# Patient Record
Sex: Female | Born: 1935 | Race: White | Hispanic: No | Smoking: Former smoker
Health system: Southern US, Community
[De-identification: ages and names within clinical notes are randomized; demographics above are authoritative.]

## PROBLEM LIST (undated history)

## (undated) DIAGNOSIS — Z8719 Personal history of other diseases of the digestive system: Secondary | ICD-10-CM

## (undated) DIAGNOSIS — Z9289 Personal history of other medical treatment: Secondary | ICD-10-CM

## (undated) DIAGNOSIS — I480 Paroxysmal atrial fibrillation: Secondary | ICD-10-CM

## (undated) DIAGNOSIS — R7309 Other abnormal glucose: Secondary | ICD-10-CM

## (undated) DIAGNOSIS — E7800 Pure hypercholesterolemia, unspecified: Secondary | ICD-10-CM

## (undated) DIAGNOSIS — N183 Chronic kidney disease, stage 3 unspecified: Secondary | ICD-10-CM

## (undated) DIAGNOSIS — C4490 Unspecified malignant neoplasm of skin, unspecified: Secondary | ICD-10-CM

## (undated) DIAGNOSIS — H269 Unspecified cataract: Secondary | ICD-10-CM

## (undated) DIAGNOSIS — I5032 Chronic diastolic (congestive) heart failure: Secondary | ICD-10-CM

## (undated) DIAGNOSIS — J189 Pneumonia, unspecified organism: Secondary | ICD-10-CM

## (undated) DIAGNOSIS — D649 Anemia, unspecified: Secondary | ICD-10-CM

## (undated) DIAGNOSIS — T8859XA Other complications of anesthesia, initial encounter: Secondary | ICD-10-CM

## (undated) DIAGNOSIS — R001 Bradycardia, unspecified: Secondary | ICD-10-CM

## (undated) DIAGNOSIS — F329 Major depressive disorder, single episode, unspecified: Secondary | ICD-10-CM

## (undated) DIAGNOSIS — J45909 Unspecified asthma, uncomplicated: Secondary | ICD-10-CM

## (undated) DIAGNOSIS — R1310 Dysphagia, unspecified: Secondary | ICD-10-CM

## (undated) DIAGNOSIS — I82409 Acute embolism and thrombosis of unspecified deep veins of unspecified lower extremity: Secondary | ICD-10-CM

## (undated) DIAGNOSIS — M109 Gout, unspecified: Secondary | ICD-10-CM

## (undated) DIAGNOSIS — K219 Gastro-esophageal reflux disease without esophagitis: Secondary | ICD-10-CM

## (undated) DIAGNOSIS — I10 Essential (primary) hypertension: Secondary | ICD-10-CM

## (undated) DIAGNOSIS — K5792 Diverticulitis of intestine, part unspecified, without perforation or abscess without bleeding: Secondary | ICD-10-CM

## (undated) DIAGNOSIS — Z950 Presence of cardiac pacemaker: Secondary | ICD-10-CM

## (undated) DIAGNOSIS — T4145XA Adverse effect of unspecified anesthetic, initial encounter: Secondary | ICD-10-CM

## (undated) DIAGNOSIS — M797 Fibromyalgia: Secondary | ICD-10-CM

## (undated) DIAGNOSIS — F32A Depression, unspecified: Secondary | ICD-10-CM

## (undated) DIAGNOSIS — M1990 Unspecified osteoarthritis, unspecified site: Secondary | ICD-10-CM

## (undated) HISTORY — DX: Dysphagia, unspecified: R13.10

## (undated) HISTORY — DX: Chronic kidney disease, stage 3 unspecified: N18.30

## (undated) HISTORY — DX: Chronic diastolic (congestive) heart failure: I50.32

## (undated) HISTORY — DX: Chronic kidney disease, stage 3 (moderate): N18.3

## (undated) HISTORY — DX: Bradycardia, unspecified: R00.1

## (undated) HISTORY — DX: Paroxysmal atrial fibrillation: I48.0

## (undated) HISTORY — PX: TUBAL LIGATION: SHX77

## (undated) HISTORY — DX: Unspecified malignant neoplasm of skin, unspecified: C44.90

## (undated) HISTORY — DX: Other abnormal glucose: R73.09

## (undated) HISTORY — PX: SKIN CANCER EXCISION: SHX779

## (undated) HISTORY — PX: TUMOR REMOVAL: SHX12

## (undated) HISTORY — PX: KNEE ARTHROSCOPY: SUR90

## (undated) HISTORY — PX: ABDOMINAL HYSTERECTOMY: SHX81

## (undated) HISTORY — PX: COLONOSCOPY W/ BIOPSIES: SHX1374

## (undated) HISTORY — PX: APPENDECTOMY: SHX54

## (undated) HISTORY — DX: Personal history of other diseases of the digestive system: Z87.19

## (undated) HISTORY — DX: Personal history of other medical treatment: Z92.89

## (undated) HISTORY — DX: Essential (primary) hypertension: I10

---

## 1997-10-01 ENCOUNTER — Emergency Department (HOSPITAL_COMMUNITY): Admission: EM | Admit: 1997-10-01 | Discharge: 1997-10-01 | Payer: Self-pay | Admitting: Emergency Medicine

## 1997-11-18 ENCOUNTER — Other Ambulatory Visit: Admission: RE | Admit: 1997-11-18 | Discharge: 1997-11-18 | Payer: Self-pay | Admitting: *Deleted

## 1997-11-29 ENCOUNTER — Ambulatory Visit (HOSPITAL_COMMUNITY): Admission: RE | Admit: 1997-11-29 | Discharge: 1997-11-29 | Payer: Self-pay | Admitting: Family Medicine

## 1997-11-29 ENCOUNTER — Encounter: Payer: Self-pay | Admitting: Family Medicine

## 1998-12-02 ENCOUNTER — Ambulatory Visit (HOSPITAL_COMMUNITY): Admission: RE | Admit: 1998-12-02 | Discharge: 1998-12-02 | Payer: Self-pay | Admitting: Family Medicine

## 1998-12-02 ENCOUNTER — Encounter: Payer: Self-pay | Admitting: Family Medicine

## 1999-12-04 ENCOUNTER — Ambulatory Visit (HOSPITAL_COMMUNITY): Admission: RE | Admit: 1999-12-04 | Discharge: 1999-12-04 | Payer: Self-pay | Admitting: Family Medicine

## 1999-12-04 ENCOUNTER — Encounter: Payer: Self-pay | Admitting: Family Medicine

## 2000-07-28 ENCOUNTER — Encounter: Payer: Self-pay | Admitting: Family Medicine

## 2000-07-28 ENCOUNTER — Encounter: Admission: RE | Admit: 2000-07-28 | Discharge: 2000-07-28 | Payer: Self-pay | Admitting: Family Medicine

## 2000-12-07 ENCOUNTER — Encounter: Payer: Self-pay | Admitting: Family Medicine

## 2000-12-07 ENCOUNTER — Ambulatory Visit (HOSPITAL_COMMUNITY): Admission: RE | Admit: 2000-12-07 | Discharge: 2000-12-07 | Payer: Self-pay | Admitting: Family Medicine

## 2001-08-26 ENCOUNTER — Emergency Department (HOSPITAL_COMMUNITY): Admission: EM | Admit: 2001-08-26 | Discharge: 2001-08-26 | Payer: Self-pay | Admitting: Emergency Medicine

## 2001-12-18 ENCOUNTER — Ambulatory Visit (HOSPITAL_COMMUNITY): Admission: RE | Admit: 2001-12-18 | Discharge: 2001-12-18 | Payer: Self-pay | Admitting: Family Medicine

## 2001-12-18 ENCOUNTER — Encounter: Payer: Self-pay | Admitting: Family Medicine

## 2003-01-02 ENCOUNTER — Ambulatory Visit (HOSPITAL_COMMUNITY): Admission: RE | Admit: 2003-01-02 | Discharge: 2003-01-02 | Payer: Self-pay | Admitting: Internal Medicine

## 2003-02-14 ENCOUNTER — Encounter: Admission: RE | Admit: 2003-02-14 | Discharge: 2003-02-14 | Payer: Self-pay | Admitting: Internal Medicine

## 2003-11-18 ENCOUNTER — Ambulatory Visit (HOSPITAL_COMMUNITY): Admission: RE | Admit: 2003-11-18 | Discharge: 2003-11-18 | Payer: Self-pay | Admitting: Gastroenterology

## 2003-11-18 ENCOUNTER — Encounter (INDEPENDENT_AMBULATORY_CARE_PROVIDER_SITE_OTHER): Payer: Self-pay | Admitting: Specialist

## 2004-01-09 ENCOUNTER — Ambulatory Visit (HOSPITAL_COMMUNITY): Admission: RE | Admit: 2004-01-09 | Discharge: 2004-01-09 | Payer: Self-pay | Admitting: Internal Medicine

## 2004-05-18 ENCOUNTER — Encounter: Admission: RE | Admit: 2004-05-18 | Discharge: 2004-05-18 | Payer: Self-pay | Admitting: Internal Medicine

## 2004-11-12 ENCOUNTER — Encounter: Admission: RE | Admit: 2004-11-12 | Discharge: 2004-11-12 | Payer: Self-pay | Admitting: Internal Medicine

## 2005-01-18 ENCOUNTER — Ambulatory Visit (HOSPITAL_COMMUNITY): Admission: RE | Admit: 2005-01-18 | Discharge: 2005-01-18 | Payer: Self-pay | Admitting: Internal Medicine

## 2005-09-10 ENCOUNTER — Encounter: Admission: RE | Admit: 2005-09-10 | Discharge: 2005-09-10 | Payer: Self-pay | Admitting: Internal Medicine

## 2005-10-09 HISTORY — PX: CHOLECYSTECTOMY: SHX55

## 2005-10-19 ENCOUNTER — Ambulatory Visit (HOSPITAL_COMMUNITY): Admission: RE | Admit: 2005-10-19 | Discharge: 2005-10-20 | Payer: Self-pay | Admitting: *Deleted

## 2005-10-19 ENCOUNTER — Encounter (INDEPENDENT_AMBULATORY_CARE_PROVIDER_SITE_OTHER): Payer: Self-pay | Admitting: Specialist

## 2006-01-24 ENCOUNTER — Ambulatory Visit: Payer: Self-pay | Admitting: Cardiology

## 2006-01-24 ENCOUNTER — Emergency Department (HOSPITAL_COMMUNITY): Admission: EM | Admit: 2006-01-24 | Discharge: 2006-01-24 | Payer: Self-pay | Admitting: Emergency Medicine

## 2006-02-11 ENCOUNTER — Ambulatory Visit: Payer: Self-pay

## 2006-02-15 ENCOUNTER — Encounter: Admission: RE | Admit: 2006-02-15 | Discharge: 2006-02-15 | Payer: Self-pay | Admitting: Pediatrics

## 2006-02-18 ENCOUNTER — Ambulatory Visit (HOSPITAL_COMMUNITY): Admission: RE | Admit: 2006-02-18 | Discharge: 2006-02-18 | Payer: Self-pay | Admitting: *Deleted

## 2006-02-18 ENCOUNTER — Ambulatory Visit: Payer: Self-pay | Admitting: Cardiology

## 2006-07-21 ENCOUNTER — Encounter: Admission: RE | Admit: 2006-07-21 | Discharge: 2006-07-21 | Payer: Self-pay | Admitting: Orthopedic Surgery

## 2007-02-21 ENCOUNTER — Ambulatory Visit: Payer: Self-pay | Admitting: Cardiology

## 2007-03-01 ENCOUNTER — Ambulatory Visit: Payer: Self-pay

## 2007-03-01 ENCOUNTER — Encounter: Admission: RE | Admit: 2007-03-01 | Discharge: 2007-03-01 | Payer: Self-pay | Admitting: Family Medicine

## 2007-03-01 ENCOUNTER — Encounter: Payer: Self-pay | Admitting: Cardiology

## 2007-03-28 ENCOUNTER — Ambulatory Visit: Payer: Self-pay | Admitting: Cardiology

## 2007-05-08 ENCOUNTER — Ambulatory Visit: Payer: Self-pay | Admitting: Cardiovascular Disease

## 2007-05-08 ENCOUNTER — Observation Stay (HOSPITAL_COMMUNITY): Admission: EM | Admit: 2007-05-08 | Discharge: 2007-05-10 | Payer: Self-pay | Admitting: Emergency Medicine

## 2007-05-10 HISTORY — PX: LAMINECTOMY: SHX219

## 2007-05-17 ENCOUNTER — Inpatient Hospital Stay (HOSPITAL_COMMUNITY): Admission: RE | Admit: 2007-05-17 | Discharge: 2007-05-20 | Payer: Self-pay | Admitting: Neurosurgery

## 2007-06-22 ENCOUNTER — Ambulatory Visit: Payer: Self-pay | Admitting: Cardiology

## 2007-07-10 ENCOUNTER — Ambulatory Visit: Payer: Self-pay | Admitting: Internal Medicine

## 2007-07-10 ENCOUNTER — Inpatient Hospital Stay (HOSPITAL_COMMUNITY): Admission: EM | Admit: 2007-07-10 | Discharge: 2007-07-14 | Payer: Self-pay | Admitting: Emergency Medicine

## 2007-07-11 ENCOUNTER — Ambulatory Visit: Payer: Self-pay | Admitting: Physical Medicine & Rehabilitation

## 2007-09-15 ENCOUNTER — Encounter: Admission: RE | Admit: 2007-09-15 | Discharge: 2007-09-15 | Payer: Self-pay | Admitting: Neurosurgery

## 2008-01-02 ENCOUNTER — Ambulatory Visit: Payer: Self-pay | Admitting: Cardiology

## 2008-02-09 HISTORY — PX: LARYNGOSCOPY / BRONCHOSCOPY / ESOPHAGOSCOPY: SUR818

## 2008-03-01 ENCOUNTER — Encounter: Admission: RE | Admit: 2008-03-01 | Discharge: 2008-03-01 | Payer: Self-pay | Admitting: Family Medicine

## 2008-06-05 ENCOUNTER — Emergency Department (HOSPITAL_COMMUNITY): Admission: EM | Admit: 2008-06-05 | Discharge: 2008-06-06 | Payer: Self-pay | Admitting: Emergency Medicine

## 2008-06-06 ENCOUNTER — Inpatient Hospital Stay (HOSPITAL_COMMUNITY): Admission: EM | Admit: 2008-06-06 | Discharge: 2008-06-11 | Payer: Self-pay | Admitting: Emergency Medicine

## 2008-06-06 ENCOUNTER — Ambulatory Visit: Payer: Self-pay | Admitting: Cardiology

## 2008-06-07 ENCOUNTER — Encounter (INDEPENDENT_AMBULATORY_CARE_PROVIDER_SITE_OTHER): Payer: Self-pay | Admitting: Internal Medicine

## 2008-07-01 ENCOUNTER — Encounter: Admission: RE | Admit: 2008-07-01 | Discharge: 2008-07-01 | Payer: Self-pay | Admitting: Family Medicine

## 2008-08-19 DIAGNOSIS — I10 Essential (primary) hypertension: Secondary | ICD-10-CM | POA: Insufficient documentation

## 2008-08-19 DIAGNOSIS — Z8719 Personal history of other diseases of the digestive system: Secondary | ICD-10-CM | POA: Insufficient documentation

## 2008-08-19 DIAGNOSIS — I4891 Unspecified atrial fibrillation: Secondary | ICD-10-CM | POA: Insufficient documentation

## 2008-08-27 ENCOUNTER — Ambulatory Visit: Payer: Self-pay | Admitting: Cardiology

## 2008-10-23 ENCOUNTER — Encounter: Admission: RE | Admit: 2008-10-23 | Discharge: 2008-10-23 | Payer: Self-pay | Admitting: Gastroenterology

## 2009-03-25 ENCOUNTER — Encounter: Admission: RE | Admit: 2009-03-25 | Discharge: 2009-03-25 | Payer: Self-pay | Admitting: Family Medicine

## 2009-07-25 ENCOUNTER — Telehealth: Payer: Self-pay | Admitting: Cardiology

## 2009-07-30 ENCOUNTER — Ambulatory Visit: Payer: Self-pay | Admitting: Cardiovascular Disease

## 2009-07-30 DIAGNOSIS — R079 Chest pain, unspecified: Secondary | ICD-10-CM | POA: Insufficient documentation

## 2009-08-19 ENCOUNTER — Ambulatory Visit: Payer: Self-pay | Admitting: Cardiology

## 2009-09-25 ENCOUNTER — Telehealth: Payer: Self-pay | Admitting: Cardiology

## 2010-03-03 ENCOUNTER — Other Ambulatory Visit: Payer: Self-pay | Admitting: Family Medicine

## 2010-03-03 DIAGNOSIS — Z1239 Encounter for other screening for malignant neoplasm of breast: Secondary | ICD-10-CM

## 2010-03-06 ENCOUNTER — Other Ambulatory Visit: Payer: Self-pay | Admitting: Gastroenterology

## 2010-03-10 NOTE — Progress Notes (Signed)
Summary: refill request   Phone Note Refill Request Message from:  Patient on September 25, 2009 10:05 AM  Refills Requested: Medication #1:  METOPROLOL TARTRATE 25 MG TABS 1/2 tab by mouth at bedtime.  Medication #2:  LISINOPRIL 20 MG TABS 1 tab once daily [facility] mail order   Method Requested: Telephone to Pharmacy Initial call taken by: Lorenda Hatchet,  September 25, 2009 10:05 AM    Prescriptions: METOPROLOL TARTRATE 25 MG TABS (METOPROLOL TARTRATE) 1/2 tab by mouth at bedtime  #90 x 3   Entered by:   Julaine Hua, CMA   Authorized by:   Renella Cunas, MD, [facility]   Signed by:   Julaine Hua, CMA on 09/25/2009   Method used:   Faxed to ...       [facility] (mail-order)             , [place]         Ph: [phone]       Fax: [phone]   RxIDPW:[id number] LISINOPRIL 20 MG TABS (LISINOPRIL) 1 tab once daily  #90 x 3   Entered by:   Julaine Hua, CMA   Authorized by:   Renella Cunas, MD, [facility]   Signed by:   Julaine Hua, CMA on 09/25/2009   Method used:   Faxed to ...       [facility] (mail-order)             , [place]         Ph: [phone]       Fax: [phone]   RxID:   [id number]

## 2010-03-10 NOTE — Assessment & Plan Note (Signed)
Summary: rov/chest pain/arm & shoulder pain.irregualr Heart rate/DOD c...  Medications Added CYMBALTA 30 MG CPEP (DULOXETINE HCL) 1 tab by mouth once daily LISINOPRIL 20 MG TABS (LISINOPRIL) 1 tab by mouth two times a day METOPROLOL TARTRATE 25 MG TABS (METOPROLOL TARTRATE) 1/2 tab by mouth at bedtime      Allergies Added:   CC:  chest pain.  History of Present Illness: [name] is a patient of Dr Verl Blalock who was added on to my DOD schedule.  She has a history of PAF not on coumadin with palpitations.  There is no documented CAD.  She has elevated lipids, HTN and borderline DM. Last week while laying in bed she had SSCP.  She took an ASA and some stomach medicine and felt better in 2 hrs.  She has not had a recurrence.  She indicates having a stress test in the last 5 years but I cannot find any records of one.  Apparantly Dr Olevia Perches called in some metroprolol for her and she feels better with less palpitations on this.  She has been in NSR and is not on coumadin.  She denies recurrent SSCP, or palpitaotns in the past week  Current Problems (verified): 1)  Paroxysmal Atrial Fibrillation  (ICD-427.31) 2)  Palpitations/tachy  (ICD-785.1) 3)  Hypertension, Unspecified  (ICD-401.9) 4)  Gastroesophageal Reflux Disease, Hx of  (ICD-V12.79) 5)  Diabetes Mellitus, Borderline, Controlled/diet Controlled  (ICD-790.29) 6)  Odynophagia  ()  Current Medications (verified): 1)  Protonix 40 Mg Tbec (Pantoprazole Sodium) .Marland Kitchen.. 1 Tab Once Daily 2)  Cymbalta 30 Mg Cpep (Duloxetine Hcl) .Marland Kitchen.. 1 Tab By Mouth Once Daily 3)  Singulair 10 Mg Tabs (Montelukast Sodium) .Marland Kitchen.. 1 Tab Once Daily 4)  Pravastatin Sodium 20 Mg Tabs (Pravastatin Sodium) .Marland Kitchen.. 1 Tab Once Daily 5)  Lisinopril 20 Mg Tabs (Lisinopril) .Marland Kitchen.. 1 Tab By Mouth Two Times A Day 6)  Sertraline Hcl 50 Mg Tabs (Sertraline Hcl) .Marland Kitchen.. 1 Tab Once Daily 7)  Calcium Citrate-Vitamin D 315-200 Mg-Unit Tabs (Calcium Citrate-Vitamin D) .... 2 Tabs Once Daily 8)  Fish  Oil 1000 Mg Caps (Omega-3 Fatty Acids) .Marland Kitchen.. 1 Cap Once Daily 9)  Aspirin 81 Mg Tbec (Aspirin) .... Take One Tablet By Mouth Daily 10)  Multivitamins   Tabs (Multiple Vitamin) .Marland Kitchen.. 1 Tab Once Daily 11)  Fosamax 70 Mg Tabs (Alendronate Sodium) .Marland Kitchen.. 1 Tab Weekly 12)  Metoprolol Tartrate 25 Mg Tabs (Metoprolol Tartrate) .... 1/2 Tab By Mouth At Bedtime  Allergies (verified): 1)  ! Sulfa 2)  ! Diltiazem Hcl Er Beads  Past History:  Past Medical History: Last updated: 08/19/2008 PAROXYSMAL ATRIAL FIBRILLATION (ICD-427.31) PALPITATIONS/TACHY (ICD-785.1) HYPERTENSION, UNSPECIFIED (ICD-401.9) GASTROESOPHAGEAL REFLUX DISEASE, HX OF (ICD-V12.79) DIABETES MELLITUS, BORDERLINE, CONTROLLED/DIET CONTROLLED (ICD-790.29) * ODYNOPHAGIA    Past Surgical History: Last updated: 08/19/2008 1. Direct laryngoscopy.  2. Esophagoscopy.  3. Bronchoscopy.  06/06/2008 .Marland Kitchen Leta Baptist, MD.  L4-5 laminotomy, foraminotomy done bilaterally.  05/17/2007  Cholecystectomy.Marland Kitchen 10/19/2005.Jonne Ply, M.D. Esophagogastroduodenoscopy with biopsy for CLO test... 11/18/2003.Wonda Horner, M.D Colonoscopy with biopsy...11/18/2003.Wonda Horner, M.D.   Hysterectomy Appendectomy  Family History: Last updated: 08/19/2008 The patient denies any history of osteoporosis. Mother: deceased at 47 due to heart disease Family History of Diabetes: Father deceased at 5   Social History: Last updated: 08/19/2008 Tobacco Use - Former.  quit 40 yrs ago Alcohol Use - no Drug Use - no She lives with her son who is handicapped but able to help her somewhat.  Review of Systems       Denies fever, malais, weight loss, blurry vision, decreased visual acuity, cough, sputum, SOB, hemoptysis, pleuritic pain, palpitaitons, heartburn, abdominal pain, melena, lower extremity edema, claudication, or rash.   Vital Signs:  Patient profile:   77 year old female Height:      62 inches Weight:      187 pounds BMI:      34.33 Pulse rate:   70 / minute Resp:     14 per minute BP sitting:   112 / 67  (left arm)  Vitals Entered By: Burnett Kanaris (July 30, 2009 10:37 AM)  Physical Exam  General:  Affect appropriate Healthy:  appears stated age 7: normal Neck supple with no adenopathy JVP normal no bruits no thyromegaly Lungs clear with no wheezing and good diaphragmatic motion Heart:  S1/S2 no murmur,rub, gallop or click PMI normal Abdomen: benighn, BS positve, no tenderness, no AAA no bruit.  No HSM or HJR Distal pulses intact with no bruits No edema Neuro non-focal Skin warm and dry    Impression & Recommendations:  Problem # 1:  PAROXYSMAL ATRIAL FIBRILLATION (ICD-427.31) Maint NSR improved with BB  Continue ASA Her updated medication list for this problem includes:    Aspirin 81 Mg Tbec (Aspirin) .Marland Kitchen... Take one tablet by mouth daily    Metoprolol Tartrate 25 Mg Tabs (Metoprolol tartrate) .Marland Kitchen... 1/2 tab by mouth at bedtime  Orders: EKG w/ Interpretation (93000)  Problem # 2:  HYPERTENSION, UNSPECIFIED (ICD-401.9) WEll controlled Her updated medication list for this problem includes:    Lisinopril 20 Mg Tabs (Lisinopril) .Marland Kitchen... 1 tab by mouth two times a day    Aspirin 81 Mg Tbec (Aspirin) .Marland Kitchen... Take one tablet by mouth daily    Metoprolol Tartrate 25 Mg Tabs (Metoprolol tartrate) .Marland Kitchen... 1/2 tab by mouth at bedtime  Problem # 3:  CHEST PAIN UNSPECIFIED (ICD-786.50) Doubt angina.  More likely related to doucmented issues with reflux and aspiration.  No recent myovue.  F/U TW July to see if he wants to order one.  ECG today no acute changes.  Continue ASA and BB Her updated medication list for this problem includes:    Lisinopril 20 Mg Tabs (Lisinopril) .Marland Kitchen... 1 tab by mouth two times a day    Aspirin 81 Mg Tbec (Aspirin) .Marland Kitchen... Take one tablet by mouth daily    Metoprolol Tartrate 25 Mg Tabs (Metoprolol tartrate) .Marland Kitchen... 1/2 tab by mouth at bedtime  Patient Instructions: 1)  Your  physician recommends that you schedule a follow-up appointment in: [place] 12 ART 11:00 with dr wall 2)  Your physician recommends that you continue on your current medications as directed. Please refer to the Current Medication list given to you today.   EKG Report  Procedure date:  07/30/2009  Findings:      NSR 70 RSR' LAFB No change from prev

## 2010-03-10 NOTE — Progress Notes (Signed)
Summary: Chest pains last night  Medications Added METOPROLOL TARTRATE 25 MG TABS (METOPROLOL TARTRATE) 1/2 two times a day       Phone Note Call from Patient Call back at Home Phone [phone]   Caller: Patient Summary of Call: Pt was having chest pains last night ,pt don't feel good today Initial call taken by: Delsa Sale,  July 25, 2009 8:34 AM  Follow-up for Phone Call        SPOKE WITH PT  THIS AM  RE MESSAGE PER PT C/O HEART BEATING FAST AND POUNDING ,ARMS HURTING CHEST PAIN AND FELT NAUSEATED LAST NOC.PER DR BRODIE  START LOPRESSOR 25 MG 1/2 TAB two times a day AND SEE MD BEGINNING OF NEXT WEEK . PT AWARE.APPT WITH DR Johnsie Cancel ON 07/30/09 AT 10:30. Follow-up by: Devra Dopp, LPN,  June 17, [id number] [id number] AM  Additional Follow-up for Phone Call Additional follow up Details #1::         Reviewed Mar Daring, MD     New/Updated Medications: METOPROLOL TARTRATE 25 MG TABS (METOPROLOL TARTRATE) 1/2 two times a day Prescriptions: METOPROLOL TARTRATE 25 MG TABS (METOPROLOL TARTRATE) 1/2 two times a day  #30 x 6   Entered by:   Devra Dopp, LPN   Authorized by:   Renella Cunas, MD, [facility]   Signed by:   Devra Dopp, LPN on [id number]   Method used:   Electronically to        [id number] ([facility])       [address]       [place], [place]  [address]       Ph: [phone]       Fax: [phone]   RxID:   [id number]

## 2010-03-10 NOTE — Assessment & Plan Note (Signed)
Summary: F1Y/ANAS  Medications Added LISINOPRIL 20 MG TABS (LISINOPRIL) 1 tab once daily        Visit Type:  1 yr f/u Primary Provider:  Rosine Beat  CC:  edema/ankles..pt states she has bilateral leg pain when she walks...has had some sob...denies any cp.  History of Present Illness: [name] returns today for further evaluation and management of her chest pain. She was evaluated by Dr. Johnsie Cancel several weeks ago in my absence. He was felt this was most likely not cardiac. Please refer to that note.  She is on a proton pump inhibitor. She is to start a low-dose metoprolol for palpitations which have helped. She has had no further chest discomfort.  She still unsteady with her gait. Because of this and symptomatic atrial fib, we treated her with aspirin. She denies any falls recently.  Current Medications (verified): 1)  Protonix 40 Mg Tbec (Pantoprazole Sodium) .Marland Kitchen.. 1 Tab Once Daily 2)  Cymbalta 30 Mg Cpep (Duloxetine Hcl) .Marland Kitchen.. 1 Tab By Mouth Once Daily 3)  Singulair 10 Mg Tabs (Montelukast Sodium) .Marland Kitchen.. 1 Tab Once Daily 4)  Pravastatin Sodium 20 Mg Tabs (Pravastatin Sodium) .Marland Kitchen.. 1 Tab Once Daily 5)  Lisinopril 20 Mg Tabs (Lisinopril) .Marland Kitchen.. 1 Tab Once Daily 6)  Sertraline Hcl 50 Mg Tabs (Sertraline Hcl) .Marland Kitchen.. 1 Tab Once Daily 7)  Calcium Citrate-Vitamin D 315-200 Mg-Unit Tabs (Calcium Citrate-Vitamin D) .... 2 Tabs Once Daily 8)  Fish Oil 1000 Mg Caps (Omega-3 Fatty Acids) .Marland Kitchen.. 1 Cap Once Daily 9)  Aspirin 81 Mg Tbec (Aspirin) .... Take One Tablet By Mouth Daily 10)  Multivitamins   Tabs (Multiple Vitamin) .Marland Kitchen.. 1 Tab Once Daily 11)  Fosamax 70 Mg Tabs (Alendronate Sodium) .Marland Kitchen.. 1 Tab Weekly 12)  Metoprolol Tartrate 25 Mg Tabs (Metoprolol Tartrate) .... 1/2 Tab By Mouth At Bedtime  Allergies: 1)  ! Sulfa 2)  ! Diltiazem Hcl Er Beads  Past History:  Past Medical History: Last updated: 08/19/2008 PAROXYSMAL ATRIAL FIBRILLATION (ICD-427.31) PALPITATIONS/TACHY  (ICD-785.1) HYPERTENSION, UNSPECIFIED (ICD-401.9) GASTROESOPHAGEAL REFLUX DISEASE, HX OF (ICD-V12.79) DIABETES MELLITUS, BORDERLINE, CONTROLLED/DIET CONTROLLED (ICD-790.29) * ODYNOPHAGIA    Past Surgical History: Last updated: 08/19/2008 1. Direct laryngoscopy.  2. Esophagoscopy.  3. Bronchoscopy.  06/06/2008 .Marland Kitchen Leta Baptist, MD.  L4-5 laminotomy, foraminotomy done bilaterally.  05/17/2007  Cholecystectomy.Marland Kitchen 10/19/2005.Jonne Ply, M.D. Esophagogastroduodenoscopy with biopsy for CLO test... 11/18/2003.Wonda Horner, M.D Colonoscopy with biopsy...11/18/2003.Wonda Horner, M.D.   Hysterectomy Appendectomy  Family History: Last updated: 08/19/2008 The patient denies any history of osteoporosis. Mother: deceased at 57 due to heart disease Family History of Diabetes: Father deceased at 47   Social History: Last updated: 08/19/2008 Tobacco Use - Former.  quit 40 yrs ago Alcohol Use - no Drug Use - no She lives with her son who is handicapped but able to help her somewhat.      Risk Factors: Smoking Status: quit (08/19/2008)  Review of Systems       negative other than history of present illness  Vital Signs:  Patient profile:   77 year old female Height:      62 inches Weight:      189 pounds BMI:     34.69 Pulse rate:   61 / minute Pulse rhythm:   irregular BP sitting:   130 / 70  (left arm) Cuff size:   large  Vitals Entered By: Julaine Hua, CMA (August 19, 2009 11:07 AM)  Physical Exam  General:  Well developed,  well nourished, in no acute distress. Head:  normocephalic and atraumatic Eyes:  PERRLA/EOM intact; conjunctiva and lids normal. Neck:  Neck supple, no JVD. No masses, thyromegaly or abnormal cervical nodes. Chest Caydn Justen:  no deformities or breast masses noted Lungs:  Clear bilaterally to auscultation and percussion. Heart:  PMI nondisplaced, regular rate and rhythm, no gallop Msk:  decreased ROM.   Pulses:  pulses normal in all 4  extremities Extremities:  No clubbing or cyanosis. Neurologic:  gait is unsteady, alert oriented x3 Skin:  Intact without lesions or rashes. Psych:  Normal affect.   Impression & Recommendations:  Problem # 1:  CHEST PAIN UNSPECIFIED (ICD-786.50) Assessment Improved  Her updated medication list for this problem includes:    Lisinopril 20 Mg Tabs (Lisinopril) .Marland Kitchen... 1 tab once daily    Aspirin 81 Mg Tbec (Aspirin) .Marland Kitchen... Take one tablet by mouth daily    Metoprolol Tartrate 25 Mg Tabs (Metoprolol tartrate) .Marland Kitchen... 1/2 tab by mouth at bedtime  Problem # 2:  PALPITATIONS/TACHY (ICD-785.1) Assessment: Improved low-dose metoprolol has helped this. No change Her updated medication list for this problem includes:    Lisinopril 20 Mg Tabs (Lisinopril) .Marland Kitchen... 1 tab once daily    Aspirin 81 Mg Tbec (Aspirin) .Marland Kitchen... Take one tablet by mouth daily    Metoprolol Tartrate 25 Mg Tabs (Metoprolol tartrate) .Marland Kitchen... 1/2 tab by mouth at bedtime  Problem # 3:  HYPERTENSION, UNSPECIFIED (ICD-401.9)  Her updated medication list for this problem includes:    Lisinopril 20 Mg Tabs (Lisinopril) .Marland Kitchen... 1 tab once daily    Aspirin 81 Mg Tbec (Aspirin) .Marland Kitchen... Take one tablet by mouth daily    Metoprolol Tartrate 25 Mg Tabs (Metoprolol tartrate) .Marland Kitchen... 1/2 tab by mouth at bedtime  Problem # 4:  GASTROESOPHAGEAL REFLUX DISEASE, HX OF (ICD-V12.79) Assessment: Improved  Problem # 5:  PAROXYSMAL ATRIAL FIBRILLATION (ICD-427.31) Assessment: Improved  Her updated medication list for this problem includes:    Aspirin 81 Mg Tbec (Aspirin) .Marland Kitchen... Take one tablet by mouth daily    Metoprolol Tartrate 25 Mg Tabs (Metoprolol tartrate) .Marland Kitchen... 1/2 tab by mouth at bedtime  Orders: EKG w/ Interpretation (93000)  Problem # 6:  CHEST PAIN UNSPECIFIED (ICD-786.50)  Patient Instructions: 1)  Your physician recommends that you schedule a follow-up appointment in: 1 year with Dr. Verl Blalock 2)  Your physician recommends that you  continue on your current medications as directed. Please refer to the Current Medication list given to you today.  Appended Document: [facility]      Allergies: 1)  ! Sulfa 2)  ! Diltiazem Hcl Er Beads   EKG  Procedure date:  08/19/2009  Findings:      normal sinus rhythm, RSR prime, left anterior fascicular block, no change

## 2010-03-27 ENCOUNTER — Ambulatory Visit
Admission: RE | Admit: 2010-03-27 | Discharge: 2010-03-27 | Disposition: A | Discharge status: Home / Self Care | Payer: Medicare Other | Source: Ambulatory Visit | Attending: Family Medicine | Admitting: Family Medicine

## 2010-03-27 DIAGNOSIS — Z1239 Encounter for other screening for malignant neoplasm of breast: Secondary | ICD-10-CM

## 2010-05-19 LAB — GLUCOSE, CAPILLARY
Glucose-Capillary: 101 mg/dL — ABNORMAL HIGH (ref 70–99)
Glucose-Capillary: 113 mg/dL — ABNORMAL HIGH (ref 70–99)
Glucose-Capillary: 113 mg/dL — ABNORMAL HIGH (ref 70–99)
Glucose-Capillary: 91 mg/dL (ref 70–99)
Glucose-Capillary: 99 mg/dL (ref 70–99)

## 2010-05-19 LAB — CBC
MCHC: 34.5 g/dL (ref 30.0–36.0)
RBC: 3.22 MIL/uL — ABNORMAL LOW (ref 3.87–5.11)
WBC: 5.5 10*3/uL (ref 4.0–10.5)

## 2010-05-19 LAB — BASIC METABOLIC PANEL
CO2: 26 mEq/L (ref 19–32)
Calcium: 8.3 mg/dL — ABNORMAL LOW (ref 8.4–10.5)
Calcium: 8.5 mg/dL (ref 8.4–10.5)
Chloride: 108 mEq/L (ref 96–112)
Creatinine, Ser: 1.01 mg/dL (ref 0.4–1.2)
GFR calc Af Amer: >60 mL/min (ref >60)
GFR calc Af Amer: >60 mL/min (ref >60)
Sodium: 140 mEq/L (ref 135–145)

## 2010-05-20 LAB — TROPONIN I: Troponin I: 0.43 ng/mL — ABNORMAL HIGH (ref 0.00–0.06)

## 2010-05-20 LAB — CARDIAC PANEL(CRET KIN+CKTOT+MB+TROPI)
CK, MB: 5.5 ng/mL — ABNORMAL HIGH (ref 0.3–4.0)
CK, MB: 6.1 ng/mL — ABNORMAL HIGH (ref 0.3–4.0)
Relative Index: 2.2 (ref 0.0–2.5)
Troponin I: 0.21 ng/mL — ABNORMAL HIGH (ref 0.00–0.06)

## 2010-05-20 LAB — BRAIN NATRIURETIC PEPTIDE: Pro B Natriuretic peptide (BNP): 399 pg/mL — ABNORMAL HIGH (ref 0.0–100.0)

## 2010-05-20 LAB — GLUCOSE, CAPILLARY
Glucose-Capillary: 109 mg/dL — ABNORMAL HIGH (ref 70–99)
Glucose-Capillary: 90 mg/dL (ref 70–99)

## 2010-05-20 LAB — TSH: TSH: 0.234 u[IU]/mL — ABNORMAL LOW (ref 0.350–4.500)

## 2010-05-20 LAB — CK
Total CK: 220 U/L — ABNORMAL HIGH (ref 7–177)
Total CK: 252 U/L — ABNORMAL HIGH (ref 7–177)

## 2010-05-20 LAB — HEMOGLOBIN A1C: Hgb A1c MFr Bld: 6.1 % (ref 4.6–6.1)

## 2010-05-20 LAB — POCT I-STAT, CHEM 8
BUN: 22 mg/dL (ref 6–23)
HCT: 37 % (ref 36.0–46.0)
Sodium: 140 mEq/L (ref 135–145)
TCO2: 18 mmol/L (ref 0–100)

## 2010-05-20 LAB — IRON AND TIBC
Iron: 64 ug/dL (ref 42–135)
Saturation Ratios: 27 % (ref 20–55)
TIBC: 235 ug/dL — ABNORMAL LOW (ref 250–470)
UIBC: 171 ug/dL

## 2010-05-20 LAB — VITAMIN B12: Vitamin B-12: 358 pg/mL (ref 211–911)

## 2010-05-20 LAB — RETICULOCYTES
Retic Count, Absolute: 31 10*3/uL (ref 19.0–186.0)
Retic Ct Pct: 1 % (ref 0.4–3.1)

## 2010-05-20 LAB — LIPID PANEL: Triglycerides: 85 mg/dL (ref <150)

## 2010-06-20 ENCOUNTER — Emergency Department (HOSPITAL_COMMUNITY): Payer: Medicare Other

## 2010-06-20 ENCOUNTER — Emergency Department (HOSPITAL_COMMUNITY)
Admission: EM | Admit: 2010-06-20 | Discharge: 2010-06-20 | Disposition: A | Discharge status: Home / Self Care | Payer: Medicare Other | Attending: Emergency Medicine | Admitting: Emergency Medicine

## 2010-06-20 DIAGNOSIS — R110 Nausea: Secondary | ICD-10-CM | POA: Insufficient documentation

## 2010-06-20 DIAGNOSIS — R42 Dizziness and giddiness: Secondary | ICD-10-CM | POA: Insufficient documentation

## 2010-06-20 DIAGNOSIS — R05 Cough: Secondary | ICD-10-CM | POA: Insufficient documentation

## 2010-06-20 DIAGNOSIS — E785 Hyperlipidemia, unspecified: Secondary | ICD-10-CM | POA: Insufficient documentation

## 2010-06-20 DIAGNOSIS — I10 Essential (primary) hypertension: Secondary | ICD-10-CM | POA: Insufficient documentation

## 2010-06-20 DIAGNOSIS — I4460 Unspecified fascicular block: Secondary | ICD-10-CM | POA: Insufficient documentation

## 2010-06-20 DIAGNOSIS — J45909 Unspecified asthma, uncomplicated: Secondary | ICD-10-CM | POA: Insufficient documentation

## 2010-06-20 DIAGNOSIS — R059 Cough, unspecified: Secondary | ICD-10-CM | POA: Insufficient documentation

## 2010-06-20 LAB — COMPREHENSIVE METABOLIC PANEL
Albumin: 3.9 g/dL (ref 3.5–5.2)
Alkaline Phosphatase: 106 U/L (ref 39–117)
BUN: 35 mg/dL — ABNORMAL HIGH (ref 6–23)
CO2: 22 mEq/L (ref 19–32)
Chloride: 106 mEq/L (ref 96–112)
Creatinine, Ser: 1.77 mg/dL — ABNORMAL HIGH (ref 0.4–1.2)
GFR calc non Af Amer: 28 mL/min — ABNORMAL LOW (ref >60)
Potassium: 5.3 mEq/L — ABNORMAL HIGH (ref 3.5–5.1)
Total Bilirubin: 0.2 mg/dL — ABNORMAL LOW (ref 0.3–1.2)

## 2010-06-20 LAB — URINALYSIS, ROUTINE W REFLEX MICROSCOPIC
Bilirubin Urine: NEGATIVE
Hgb urine dipstick: NEGATIVE
Nitrite: NEGATIVE
Protein, ur: NEGATIVE mg/dL
Specific Gravity, Urine: 1.012 (ref 1.005–1.030)
Urobilinogen, UA: 0.2 mg/dL (ref 0.0–1.0)

## 2010-06-20 LAB — CBC
HCT: 34.8 % — ABNORMAL LOW (ref 36.0–46.0)
Hemoglobin: 11.8 g/dL — ABNORMAL LOW (ref 12.0–15.0)
MCH: 32 pg (ref 26.0–34.0)
MCV: 94.3 fL (ref 78.0–100.0)
Platelets: 153 10*3/uL (ref 150–400)
RBC: 3.69 MIL/uL — ABNORMAL LOW (ref 3.87–5.11)
WBC: 7.2 10*3/uL (ref 4.0–10.5)

## 2010-06-20 LAB — URINE MICROSCOPIC-ADD ON

## 2010-06-20 LAB — DIFFERENTIAL
Basophils Relative: 0 % (ref 0–1)
Eosinophils Absolute: 0.1 10*3/uL (ref 0.0–0.7)
Eosinophils Relative: 2 % (ref 0–5)
Lymphocytes Relative: 34 % (ref 12–46)
Monocytes Relative: 8 % (ref 3–12)
Neutro Abs: 4.1 10*3/uL (ref 1.7–7.7)
Neutrophils Relative %: 56 % (ref 43–77)

## 2010-06-23 NOTE — Consult Note (Signed)
NAMEYARENIS, KASSEBAUM              ACCOUNT NO.:  [id number]   MEDICAL RECORD NO.:  [id number]          PATIENT TYPE:  INP   LOCATION:  5118                         FACILITY:  [facility]   PHYSICIAN:  Susa Day, M.D.    DATE OF BIRTH:  [birth year]   DATE OF CONSULTATION:  07/10/2007  DATE OF DISCHARGE:                                 CONSULTATION   CHIEF COMPLAINT:  Right groin pain.   HISTORY:  This is a 76 year old female who yesterday fell and tripped in  the kitchen on to her right side and had acute pain into the right groin  area and lower buttock area.  She was seen in the  emergency room and  found to had a pubic ramus fracture, nondisplaced.  She was admitted by  hospitalist who were consulted for the management of her pubic rami  fracture.   REVIEW OF SYSTEMS:  Her review of systems is negative.   PAST MEDICAL HISTORY:  1. Atrial fibrillation.  2. Status post lumbar decompression in April 2009.  3. Hypertension.  4. Hyperlipidemia.   SOCIAL HISTORY:  Tobacco negative.  Alcoholic beverages negative.  She  lives with her son.   ALLERGIES:  SULFA.   MEDICATIONS:  Aspirin, Zetia, lisinopril, Protonix, sertraline,  Singulair, multivitamins, and pravastatin.   PHYSICAL EXAMINATION:  Healthy in moderate stress.  Mood and affect is  appropriate.  She is tender in the right trochanter and into the right  groin.  There is no specific pain with slight internal and external  rotation of the hip though the axial loading produces groin pain.  Good  dorsiflexion and plantar flexion of 1+ dorsalis pedis.  There is no  evidence of DVT.  She has no pain with range of motion of the left hip.   LABORATORY DATA:  Radiographs of the pelvis demonstrated a superior and  inferior pubic rami fractures, nondisplaced.  She reports no pain  posteriorly.   IMPRESSION:  Acute pubic rami fractures of the right.   PLAN:  Conservative.  I will recommend mobilization as tolerated and  analgesics  p.r.n..  Consult PT/OT, out of bed ambulation with a walker,  and toe-touch to partial weightbearing as tolerated.  It may take up to  a week or so until that is tolerated discussed that with the patient,  full 6 weeks until healing.  She can initiate full weightbearing as she  tolerates it, probably within the next 2-3 weeks.  I agree with DVT  prophylaxis while she is as bedrest.  She may require either subacute or  a home health assistance during the healing process.  I would like to  rex-ray her in 2 weeks in the office and reevaluate her.      Susa Day, M.D.  Electronically Signed     JB/MEDQ  D:  07/11/2007  T:  07/11/2007  Job:  [id number]

## 2010-06-23 NOTE — H&P (Signed)
[name], [name]              ACCOUNT NO.:  [id number]   MEDICAL RECORD NO.:  [id number]          PATIENT TYPE:  INP   LOCATION:  [id number]                         FACILITY:  [facility]   PHYSICIAN:  Farris Has, MDDATE OF BIRTH:  03-Nov-1927   DATE OF ADMISSION:  05/08/2007  DATE OF DISCHARGE:                              HISTORY & PHYSICAL   PRIMARY CARE Sherilyn Windhorst:  Georga Hacking.   CARDIOLOGIST:  [facility], Dr. Jenell Milliner.   HISTORY OF PRESENT ILLNESS:  Ms. Hoe is a 77 year old with chronic  history of bradycardia, palpitations, dyspnea on exertion and a remote  history of tobacco abuse, obesity and hypertension, who was at her  baseline state of health, but overall not feeling well and continues to  have occasional episodes of palpitations and overall weakness and  fatigue.  The patient went to bed and prior to going to bed, took her  medications, then woke up because of back pain for which she is about to  undergo surgery and was trying to take her pain medication when instead,  accidentally took another dose of her Cardizem 240 mg.  Thereafter, the  patient still continued to have palpitations and feeling like her heart  was racing, so she went to the emergency department, where she was found  to be actually bradycardic.  Per review of records, the patient is at  baseline bradycardic between mid 50s to 60s, but while monitored in the  emergency department, her heart rate did go down to 30, although she was  asymptomatic during this episode and was not aware of her heart rate  going down.  Actually, she feels that her heart is speeding up and  racing.  The patient was discussed with [facility] who said this  was actually explained per the high dose of Cardizem she took and wanted  primary care to admit the patient.  [facility] were called for  admission.   PAST MEDICAL HISTORY:  1. Chronic history of palpitations and atypical chest pain in the  past      with a negative stress test in January 2008 and relatively recent      echocardiogram done.  2. History of tobacco abuse.  3. Obesity.  4. Hyperlipidemia.  5. Hypertension.  6. Chronic obstructive pulmonary disease.  7. Chronic back pain.  The patient is to have lumbar spine surgery by      Dr. Glenna Fellows.   SOCIAL HISTORY:  The patient had a 10-pack-year smoking history 40 years  ago, does not drink alcohol, does not use drugs, lives by herself.  Son  is visiting and here in the emergency department.   FAMILY HISTORY:  Unremarkable.   MEDICATIONS:  1. Protonix 40 mg p.o. daily.  2. Sertraline 50 mg p.o. daily.  3. Zetia 10 mg p.o. daily.  4. Pravastatin 20 mg p.o. daily.  5. Singulair 10 mg p.o. daily.  6. Lisinopril 40 mg p.o. twice a day.  7. Diltiazem 240 mg p.o. daily.  8. Aspirin 81 mg p.o. daily.  9. Fish oil 1000 mg p.o. daily.  10.Glucosamine 500  mg p.o. daily.  11.Centrum Silver p.o. daily.   The patient has all those medications.   ALLERGIES:  SULFA.   PHYSICAL EXAMINATION:  VITAL SIGNS:  Blood pressure 157/74, heart rate  51, respirations 20 and temperature 97.6 and 99% on room air.  HEENT:  Mucous membranes moist.  Head nontraumatic.  LUNGS:  Occasional crackles bilaterally.  HEART:  Slow but regular.  No murmur, rub or gallop.  EXTREMITIES:  Without edema.  ABDOMEN:  Soft, nontender and non-distended.  NEUROLOGICAL:  Intact.   LABORATORY DATA:  Cardiac enzymes are negative x1.  Creatinine 1.5,  sodium 139, potassium 3.8, bicarb 27.  Hemoglobin 10.5.   EKG showing bradycardia at heart rate of 51, no ST elevation or  depression noted, no changes consistent with ischemia or infarction.  Of  note, per review of prior EKG done in  December also shows a heart rate  of 56.   ASSESSMENT AND PLAN:  This is a 77 year old female with history of  palpitations in the past, followed by Cardiology for this, who comes in  after taking too much Cardizem.  1.  Bradycardia:  This seems to be chronic, but given that patient      recently took an extra dose of Cardizem and that she had had      bradycardia down into the 30s in the emergency department, we will      admit and observe overnight.  Given that the patient continues to      have palpitations, we will get a chest x-ray and get 1 more set of      cardiac enzymes, but as symptoms have been going on for a prolonged      period of time, this is very unlikely to be myocardial infarction.  2. Elevated creatinine and also overall weakness:  We will give gentle      hydration and repeat creatinine in the morning.  3. Anemia:  We will obtain anemia panel and have the patient follow up      with primary care Jacoby Ritsema.  4. Constipation:  We will give Colace 100 mg p.o. b.i.d.  5. Hypertension:  Continue lisinopril, but hold Cardizem.  6. Prophylaxis:  Protonix and sequential compression devices.      Farris Has, MD  Electronically Signed     AVD/MEDQ  D:  05/08/2007  T:  05/08/2007  Job:  [id number]   cc:   Thomas C. Wall, MD, [employer]

## 2010-06-23 NOTE — Discharge Summary (Signed)
[name], [name]              ACCOUNT NO.:  [id number]   MEDICAL RECORD NO.:  [id number]          PATIENT TYPE:  INP   LOCATION:  5118                         FACILITY:  [facility]   PHYSICIAN:  Annita Brod, M.D.DATE OF BIRTH:  05-07-1927   DATE OF ADMISSION:  07/10/2007  DATE OF DISCHARGE:  07/12/2007                               DISCHARGE SUMMARY   ANTICIPATED DATE OF DISCHARGE:  July 12, 2007.   CONSULTANT:  Susa Day, MD, Orthopedic Surgery.   PRIMARY CARE PHYSICIAN:  Dr. Conchita Paris of [place] at [facility].   DISCHARGE DIAGNOSES:  1. Right-sided superior and inferior pubic rami fractures.  2. History of paroxysmal atrial fibrillation.  3. History of hypertension.  4. History of hyperlipidemia.   DISCHARGE MEDICATIONS:  For this patient are.  She will continue all her  home medications.  These are as follows:  1. Protonix 40.  2. Singulair 10.  3. Pravastatin 20.  4. Lisinopril 20.  5. Zoloft 50.  6. Os-Cal D daily.  7. Fish oil 1000.  8. Aspirin 81.  9. Multivitamin.  10.Zetia 10.  11.Cardizem 240.  12.As well as medication for pain, Percocet 5/325 one p.o. every 4      hours p.r.n.   HOSPITAL COURSE:  The patient is a 77 year old white female with past  medical history of hypertension and hyperlipidemia, who fell and who  suffered a mechanical fall on the night of Jul 09, 2007.  She fell  landing on her side, was unable to get up secondary to pain.  She was  brought into the emergency room.  She was found to have superior and  inferior pubic rami fractures.  These were nonoperative fractures.  The  patient was evaluated by the patient's orthopedist, Dr. Tonita Cong covering  for Dr. Dellis Filbert, who recommended mobilization as tolerated and p.r.n.  analgesics, who recommended out-of-bed ambulation with a walker and toe-  touch partial weightbearing as tolerated, leaving for a week because the  patient is to tolerate up to 6 weeks before healing.  The  patient will  initiate on full weightbearing as she tolerated probably for the next 2-  3 weeks.  She is recommended on DVT prophylaxis while at bedrest.  Dr.  Tonita Cong also recommended followup x-rays in 2 weeks.  In the interim, she  was evaluated by PT/OT and recommended short-term SNF versus inpatient  rehab, consultation is pending.  The patient is doing well.   PLAN:  Plan will be for the patient to go to the inpatient rehab versus  skilled nursing facility following discharge.  Plan for her to continue  in addition to her medication, she is recommended for 40 mg of Lovenox  subcutaneously daily until she is at full ambulatory status.      Annita Brod, M.D.  Electronically Signed     SKK/MEDQ  D:  07/11/2007  T:  07/12/2007  Job:  [id number]   cc:   Susa Day, M.D.  Dr. Conchita Paris

## 2010-06-23 NOTE — Discharge Summary (Signed)
[name], [name]              ACCOUNT NO.:  [id number]   MEDICAL RECORD NO.:  [id number]          PATIENT TYPE:  INP   LOCATION:  3007                         FACILITY:  [facility]   PHYSICIAN:  Otilio Connors, M.D.  DATE OF BIRTH:  04-Jun-1927   DATE OF ADMISSION:  05/17/2007  DATE OF DISCHARGE:  05/20/2007                               DISCHARGE SUMMARY   DIAGNOSES:  1. Lumbar stenosis.  2. Spondylosis.   DISCHARGE DIAGNOSES:  1. Lumbar stenosis.  2. Spondylosis.   PROCEDURE:  Bilateral L4-L5 semi-hemilaminectomy, foraminotomy, and  decompression.   REASON FOR ADMISSION:  The patient is a 77 year old woman who was with  back and leg pain, and was found to have lumbar stenosis, spondylitic  changes of L4-L5.  The patient was brought in for decompression.   HOSPITAL COURSE:  The patient admitted on the day of surgery and  underwent procedure without problems.  Postoperatively, she was  transferred to the recovery room and then to the intensive care unit.  She was watched there overnight and started increasing activity, and  working with therapy and did well and was transferred to floor and  continued to make progress.  Minimal pain.  Incisions remained clean,  dry, and intact.  She has been discharged to home in stable condition on  05/20/2007.   DISCHARGE MEDICATIONS:  Discharge medicines are same as  prehospitalization, Flexeril and Vicodin.   FOLLOWUP:  Dr. Jeanell Sparrow.   ACTIVITY:  No strenuous activity.           ______________________________  Otilio Connors, M.D.     JRH/MEDQ  D:  05/20/2007  T:  05/20/2007  Job:  [id number]

## 2010-06-23 NOTE — Consult Note (Signed)
[name], [name]              ACCOUNT NO.:  [id number]   MEDICAL RECORD NO.:  [id number]          PATIENT TYPE:  EMS   LOCATION:  MAJO                         FACILITY:  [facility]   PHYSICIAN:  Leta Baptist, MD            DATE OF BIRTH:  07/31/1927   DATE OF CONSULTATION:  06/05/2008  DATE OF DISCHARGE:                                 CONSULTATION   CHIEF COMPLAINT:  Difficulty breathing, possible foreign body in the  larynx.   HISTORY OF PRESENT ILLNESS:  The patient is a 77 year old female who  presents emergently to the [facility] Emergency Room on June 05, 2008,  complaining of difficulty breathing.  According to the daughter, the  patient choked a large pill immediately prior to presentation.  This  resulted in a significant coughing spells.  Since then, she has been  complaining of difficulty breathing with noisy respirations.  At the  time of presentation, she was noted to be sating in 96% on room air.  She was in no acute distress.  A lateral neck x-ray was obtained by the  emergency room physician.  It showed mild subglottic narrowing with no  obvious radiopaque foreign body.  ENT is consulted to evaluate for  possible foreign body within the larynx.   PAST MEDICAL HISTORY:  Asthma, hyperlipidemia, hypertension, and  osteoarthritis.   SOCIAL HISTORY:  No drug abuse, nondrinker, nonsmoker.   FAMILY HISTORY:  Noncontributory.   ALLERGY:  SULFA.   HOME MEDICATION:  Lisinopril, pravastatin, Protonix, sertraline,  Singulair, and Darvocet.   REVIEW OF SYSTEMS:  The patient does have a history of frequent choking  spells.  She especially has difficulty with her large pills.   PHYSICAL EXAMINATION:  VITAL SIGNS:  Temperature 98.1, pulse 91,  respiration 26, blood pressure 138/63, and oxygen saturation 96%.  GENERAL:  The patient is an elderly 77 year old white female in no acute  distress.  She is alert and oriented x3.  HEENT:  Her pupils are equal, round, and reactive to  light.  Extraocular  motion is intact.  Examination of the ears shows normal auricles and  external auditory canals.  Nasal examination shows normal mucosa,  septum, and turbinates.  Oral cavity examination shows normal lips, gum,  tongue, oral cavity and oropharyngeal mucosa.  No suspicious mass or  lesion is noted.  NECK:  Palpation of the neck reveals no lymphadenopathy or mass.  The  trachea is midline.  No obvious stridor is noted.  CHEST:  Auscultation of the chest and the back reveals no wheezing in  either lung fields.   PROCEDURE PERFORMED:  1. Fiberoptic laryngoscopy.  2. Anesthesia, topical Xylocaine with Neo-Synephrine.   DESCRIPTION:  The patient is placed upright in the hospital bed.  After  adequate local anesthesia is achieved, a 4-mm flexible Olympus scope was  used for examination.  The scope was passed through the right nostril  into the right nasal cavity.  The nasal mucosa is within normal limits.  The scope was advanced past the choanal opening into the pharynx.  The  pharyngeal  mucosa is normal.  The epiglottis, aryepiglottic folds, and  arytenoids all noted to be mildly edematous.  However, no suspicious  mass or lesion is noted.  The vocal cords are both symmetric and mobile.  No foreign body is noted within the piriform sinus or the vallecula.  No  other abnormality is noted.  The scope was withdrawn.  The patient  tolerated the procedure well.   IMPRESSION:  1. Mild laryngeal edema is noted on today's fiberoptic laryngoscopy.      However, no foreign body is noted.  2. The radiographic results are consistent with mild subglottic edema,      suggestive of croup.   RECOMMENDATIONS:  1. No acute intervention is needed at this time.  The possibility of      aspiration appears to be low at this time.  There is no obvious      wheezing or asymmetric breast sound is noted.  2. We will proceed with conservative observation for now and the      patient will be  discharged home if she continues to do well.  She      will follow up in my office on a p.r.n. basis.      Leta Baptist, MD  Electronically Signed     ST/MEDQ  D:  06/05/2008  T:  06/06/2008  Job:  [id number]

## 2010-06-23 NOTE — H&P (Signed)
[name], [name]              ACCOUNT NO.:  [id number]   MEDICAL RECORD NO.:  [id number]          PATIENT TYPE:  OBV   LOCATION:  [id number]                         FACILITY:  [facility]   PHYSICIAN:  Elizabeth Sauer, M.D.      DATE OF BIRTH:  [birth year]   DATE OF ADMISSION:  05/07/2007  DATE OF DISCHARGE:  05/10/2007                              HISTORY & PHYSICAL   ADMITTING DIAGNOSIS:  Spinal stenosis at L4-5.   HISTORY:  A 77 year old right-handed white lady who for several years  was having increasing pain in her back and down her left leg  particularly when she is up and about or sitting.  She has undergone  myelography, which shows spinal stenosis at L4-5.  She came to see me.   MEDICAL HISTORY:  Remarkable for myocardial stiffness.   MEDICATIONS:  Protonix, Zetia, Singulair, pravastatin, lisinopril,  sertraline, calcium, fish oil, aspirin, and Centrum Silver.   ALLERGIES:  None.   SURGICAL HISTORY:  Hysterectomy and appendectomy.   SOCIAL HISTORY:  She does not smoke or drink, and is retired.  She  worked at [employer].   FAMILY HISTORY:  Mother died at 70 of heart disease.  Father died at 27,  he had diabetes.   REVIEW OF SYSTEMS:  GENERAL:  Remarkable for wearing glasses, cataracts,  nasal congestion, hypertension, hypercholesterolemia, leg pain, COPD,  indigestion, abdominal pain, she had a broken wrist, leg weakness, back  pain, leg pain, joint pain, arthritis, and skin cancer.  HEENT:  Within  normal limits.  She has reasonable range of motion in her neck.  CHEST:  Clear.  CARDIAC:  Regular rate and rhythm with a 1/6 murmur.  ABDOMEN:  Nontender.  No hepatosplenomegaly.  EXTREMITIES:  Without clubbing or  cyanosis.  GU:  Deferred.  Peripheral pulses are good.  NEUROLOGICAL:  She is awake, alert, and oriented.  Cranial nerves are intact.  Motor  exam shows 5/5 strength throughout the upper and lower extremities.  Sensory, she described in the left L5-S1 distribution,  reflexes are 1 in  the left knee, 2 at the right, 2 at the left ankle, and 2 at the right.  Straight leg raise is positive on the left.   She comes to complete the disk of x-rays, which is not very good and an  MRI in [place] is reasonably good.  She has spinal stenosis  at L4-5, bit of ligamentum flavum hypertrophy, and a bit of facet  arthropathy.   CLINICAL IMPRESSION:  Neurogenic claudication secondary to spinal  stenosis.   PLAN:  Bilateral laminotomy, foraminotomy at L4-5, removing the  ligamentum flavum, and cutting the spinous process.  The risks and  benefits have been discussed with her and she wished to proceed.           ______________________________  Elizabeth Sauer, M.D.     MWR/MEDQ  D:  05/17/2007  T:  05/18/2007  Job:  [id number]

## 2010-06-23 NOTE — Op Note (Signed)
NAMEMARQUITIA, PHAUP              ACCOUNT NO.:  [id number]   MEDICAL RECORD NO.:  [id number]          PATIENT TYPE:  OBV   LOCATION:  [id number]                         FACILITY:  [facility]   PHYSICIAN:  Leta Baptist, MD            DATE OF BIRTH:  July 23, 1927   DATE OF PROCEDURE:  06/06/2008  DATE OF DISCHARGE:                               OPERATIVE REPORT   SURGEON:  Leta Baptist, MD.   PREOPERATIVE DIAGNOSES:  1. Foreign body aspiration.  2. Dyspnea  3. Dysphagia.   POSTOPERATIVE DIAGNOSIS:  Aspiration pneumonitis   PROCEDURES PERFORMED:  1. Direct laryngoscopy.  2. Esophagoscopy.  3. Bronchoscopy.   ANESTHESIA:  General endotracheal tube anesthesia.   COMPLICATIONS:  None.   ESTIMATED BLOOD LOSS:  None.   INDICATIONS FOR THE PROCEDURE:  The patient is a 77 year old female who  presented to the [facility] Emergency Room on April 28 and the 29 of  2010, complaining of shortness of breath, dysphagia, and dyspnea on  exertion.  According to the patient, her symptoms started after she took  a large glucosamine pill.  Her chest x-ray and neck and chest CT were  unremarkable.  However, based on her persistent symptoms, the decision  was made for the patient to undergo operative panendoscopy to evaluate  for possible foreign body aspiration or infection.  The risks, benefits,  alternatives, and details of the procedure were discussed with the  patient.  Questions were invited and answered.  Informed consent was  obtained.   DESCRIPTION OF THE PROCEDURE:  The patient was taken to the operating  room and placed supine on the operating table.  General endotracheal  tube anesthesia was administered by the anesthesiologist.  The patient  was positioned and then prepped and draped in a standard fashion for  panendoscopy.  The first portion of the case is a direct laryngoscopy.  A Dedo laryngoscope was inserted via the oral cavity into the pharynx.  The vallecula, epiglottis, aryepiglottic folds,  arytenoids, piriform  sinuses, and true vocal cords were all within normal limits.  No foreign  body was noted.  The Dedo laryngoscope was removed.  Rigid esophagoscope  was then inserted via the oral cavity into the esophageal inlet.  It was  subsequently advanced past the esophageal inlet into the esophageal  lumen.  The esophageal mucosa was noted to be normal.  No foreign body  was noted.  The rigid esophagoscope was removed.   A flexible bronchoscope was then inserted via the endotracheal tube into  the trachea.  The endotracheal tube was subsequently withdrawn.  Examination of the trachea and mainstem bronchi shows significant amount  of thick yellowish and greenish secretions.  The trachea and mainstem  bronchi were then flushed with saline.  They were subsequently  suctioned.  The flexible suction was passed on multiple occasions.  After the thick secretion was suctioned, the tracheal and the mainstem  bronchi were noted to be moderately inflamed.  No other foreign body or  abnormality was noted.  The flexible bronchoscope was removed.  The care  of  the patient was turned over to the anesthesiologist.  The patient was  awakened from anesthesia without difficulty.  She was extubated and  transferred to the recovery room in good condition.   OPERATIVE FINDINGS:  Normal direct laryngoscopy and esophagoscopy  examination.  However, on bronchoscopy examination, the patient was  noted to have a significant amount of thick yellowish and greenish  secretion within the trachea and the mainstem bronchi lumen.  The  secretion was flushed and suctioned.  The mucosa of the trachea and the  mainstem bronchi were noted to be moderately inflamed.   SPECIMENS REMOVED:  None.   FOLLOWUP CARE:  The patient will be admitted by the hospitalist for  treatment of her likely aspiration pneumonitis.      Leta Baptist, MD  Electronically Signed     ST/MEDQ  D:  06/06/2008  T:  06/07/2008  Job:   [id number]

## 2010-06-23 NOTE — Consult Note (Signed)
[name], [name]              ACCOUNT NO.:  [id number]   MEDICAL RECORD NO.:  [id number]          PATIENT TYPE:  OBV   LOCATION:  [id number]                         FACILITY:  [facility]   PHYSICIAN:  Leta Baptist, MD            DATE OF BIRTH:  1927-04-28   DATE OF CONSULTATION:  06/06/2008  DATE OF DISCHARGE:                                 CONSULTATION   CHIEF COMPLAINT:  Shortness of breath, dysphagia.   HISTORY OF PRESENT ILLNESS:  The patient is a 77 year old female who  returns to the [facility] emergency room today complaining of persistent  shortness of breath and dysphagia.  The patient was previously seen in  the emergency room 1 day ago with similar complaints.  At that time, the  patient attributed her symptoms to possibly choking on a pill.  Bedside  fiberoptic laryngoscopy was performed.  At that time, no foreign body  was noted.  However, mild diffuse edema of the epiglottis, arytenoids,  and aryepiglottic folds were noted.  Her vocal cords were symmetric and  mobile.  She was treated with racemic epinephrine and Decadron and was  discharged home.  However, according to the son, the patient has not  been able to tolerate p.o. since the discharge.  She continues to have  shortness of breath, especially with exertion or when she is lying flat.  Upon arrival at the emergency room, neck and chest CT scans were  performed.  No obvious abnormality was noted on the CT scan.  Specifically, no foreign body or mass or lesion was noted.   PAST MEDICAL HISTORY:  1. Asthma.  2. Hypertension.  3. Hyperlipidemia.  4. Osteoarthritis.   PAST SURGICAL HISTORY:  Back surgery.   ALLERGIES:  SULFA.   HOME MEDICATIONS:  Lisinopril, pravastatin, Protonix, sertraline,  Singulair and Darvocet.   SOCIAL HISTORY:  Nonsmoker, nondrinker, no known drug abuse.   FAMILY HISTORY:  Noncontributory.   PHYSICAL EXAMINATION:  VITAL SIGNS:  Temperature 98.0, blood pressure  122/74, pulse 99, respiration  20, O2 sat 94% on room air.  GENERAL:  The patient is a 77 year old well-nourished and well-developed  female in no acute distress.  She is alert and oriented x3.  HEENT:  Her  pupils are equal, round, and reactive to light.  The patient is noted to  have mild shortness of breath with simple exertion.  However, no  significant stridor was noted at the time of examination.  Examination  of the ears shows normal ear canals, and tympanic membranes.  Nasal  examination shows normal mucosa, septum, and turbinates.  Oral cavity  examination shows normal lips, gums, tongue, oral cavity and  oropharyngeal mucosa.  No mass or lesion was noted.  NECK:  Palpation of the neck reveals no lymphadenopathy or mass.  The  trachea is midline.  LUNGS:  Auscultation of the lung reveals no wheezing.  Air movement can  be heard at all four quadrants.   IMPRESSION:  Shortness of breath and dysphagia of unknown etiology at  this time.  No obvious pathology is noted on  the CT scan.   RECOMMENDATIONS:  In light of the patient's persistent symptoms, the  decision is made for the patient to undergo bronchoscopy and  esophagoscopy under general anesthesia to rule out any foreign body  impaction, or mucosal pathology.  It is possible that the patient's  condition may be secondary to lower airway causes.  Depending on the  outcome of the bronchoscopy and esophagoscopy procedure, the patient may  need to be admitted for further workup at that time.      Leta Baptist, MD  Electronically Signed     ST/MEDQ  D:  06/06/2008  T:  06/07/2008  Job:  [id number]

## 2010-06-23 NOTE — Assessment & Plan Note (Signed)
[facility]                            CARDIOLOGY OFFICE NOTE   NAME:[name], [name]                     MRN:          [id number]  DATE:06/22/2007                            DOB:          1927/06/15    [name] returns today for follow-up.  She was followed in the past  for tachy palpitations, hypertension, atypical chest pain.  She had a 2-  D echocardiogram March 01, 2007 which showed moderate left ventricular  hypertrophy, mild diastolic dysfunction, mild mitral regurgitation, mild  to moderately enlarged left atrium.   She had be admitted to the hospital May 08, 2007 for bradycardia  because of taking an extra dose of diltiazem inadvertently.   During that hospitalization, which I do not have a discharge summary  for, she had an episode atrial fibrillation as I recall.  The rate was  fairly well controlled.  Her daughter and I talked, and we decided to  get her off of the diltiazem altogether.  She has been in sinus rhythm  since and has been fairly asymptomatic.   She has frequent falls and has fallen recently and broken a bone in her  right hand.  She recently had back surgery with Dr. Carloyn Manner and feels much  better.   Her current medications are:  1. Aspirin 81 mg a day.  2. Lisinopril 20 mg p.o. b.i.d.  3. Pravastatin 20 mg a day.  4. Zetia 10 mg a day.  5. Protonix.  6. Singulair.  7. Sertraline.  8. Calcium.  9. Fish oil.  10.Centrum Silver.   Her palpitations have been minimal.  She denies any orthopnea, PND, or  significant dyspnea on exertion.   Her blood pressure today was 140/80 in the left arm with my check.  Pulse is 78 and regular.  Her weight is 187.  HEENT:  Unchanged.  Carotid upstrokes were equal bilaterally without bruits, no JVD.  Thyroid is not enlarged.  Trachea is midline.  LUNGS:  Clear to auscultation.  HEART:  Reveals a regular rate and rhythm.  No gallop.  ABDOMINAL EXAM:  Soft, good bowel sounds.  EXTREMITIES:  No edema.  Pulses are intact.  NEUROLOGIC EXAM:  Is intact.   I had a nice chat with [name] and her daughter.  At this point in  time, we will continue to state the course with her medications.  I am  delighted we have been able to get off the diltiazem.  She knows that if  she has prolonged palpitations she needs to come to hospital for  anticoagulation.  I am very reluctant to put her on Coumadin because of  her risk with falling.  Her daughter agrees.  All questions were  answered.     Thomas C. Verl Blalock, MD, [facility]  Electronically Signed    TCW/MedQ  DD: 06/22/2007  DT: 06/22/2007  Job #: [id number]

## 2010-06-23 NOTE — Assessment & Plan Note (Signed)
[facility]                            CARDIOLOGY OFFICE NOTE   NAME:[name], Michelle KUBINA                     MRN:          [id number]  DATE:01/02/2008                            DOB:          [birth year]    [name] returns today for followup of her history of  tachypalpitations, hypertension, atypical chest pain as well as a brief  episode of atrial fibrillation.   Since I saw her in May, she has fallen again and broken her hip.  I am  happy that we did not put her on Coumadin as outlined by my last note.   She has not had any tachypalpitations except on rare occasion.  They  last only a few seconds.  She has had no orthostatic symptoms, and no  syncope or presyncope.   MEDICATIONS:  Her medications are unchanged since her last visit, except  now she is on the Boniva 150 mg every month.   PHYSICAL EXAMINATION:  VITAL SIGNS:  Her blood pressure today is 128/76,  her pulse is 77 and regular.  Her weight is 182, down 5.  GENERAL:  She looks remarkably good.  There is no sign of trauma.  HEENT:  Normocephalic and atraumatic.  Normal.  NECK:  Carotids upstrokes are equal bilaterally without bruits.  Thyroid  is not enlarged.  Trachea is midline.  LUNGS:  Clear to auscultation and percussion.  HEART:  Regular rate and rhythm.  No gallop or murmur.  ABDOMEN:  Soft.  Good bowel sounds.  No midline bruit.  No hepatomegaly.  EXTREMITIES:  No cyanosis, clubbing, or edema.  Pulses are intact.   I am pleased that [name] is doing well.  We checked an EKG, which  is stable with sinus rhythm and RSR prime in V1 and V2.  I have made no  changes in her medical program.  We will continue with just aspirin and  no Coumadin with her falls.  I will see her back again in 6 months.     Thomas C. Verl Blalock, MD, [facility]  Electronically Signed    TCW/MedQ  DD: 01/02/2008  DT: 01/03/2008  Job #: [id number]

## 2010-06-23 NOTE — H&P (Signed)
NAMETYLEAH, [name]              ACCOUNT NO.:  [id number]   MEDICAL RECORD NO.:  [id number]          PATIENT TYPE:  INP   LOCATION:  [id number]                         FACILITY:  [facility]   PHYSICIAN:  Crissie Reese, MD    DATE OF BIRTH:  [name], [name]   DATE OF ADMISSION:  07/09/2007  DATE OF DISCHARGE:                              HISTORY & PHYSICAL   Admission and physical to the [facility] hospitalist service, Dr. Maryland Pink.   PRIMARY CARE [name]:  [name] at [facility], Dr. Harley Hallmark.   CHIEF COMPLAINT:  Fall.   HISTORY OF PRESENTING ILLNESS:  A 77 year old white female with the past  medical history significant for hypertension and hyperlipidemia who  presents after having a mechanical fall this evening at home.  The  patient states that she was in her kitchen when she turned around and  fell on her right side.  She denies any preceding chest pain or  palpitations.  She denies losing consciousness.  She came to the  emergency department for evaluation where she was found to have a pubic  rami fracture.  Currently, she is complaining of 5/10 pain.  She had a  Foley catheter placed in the emergency department, and is otherwise  without complaint.   REVIEW OF SYSTEMS:  All systems were reviewed and negative except as  mentioned above in the history of present illness.   PAST MEDICAL HISTORY:  1. Paroxysmal atrial fibrillation, currently in normal sinus rhythm,      not on Coumadin chronically.  2. Status post recent back surgery for lumbar stenosis in Michelle 2009.  3. Hypertension.  4. Hyperlipidemia.   SOCIAL HISTORY:  The patient lives in [place] with her son.  She  denies tobacco, alcohol, or drugs.   FAMILY HISTORY:  The patient denies any history of osteoporosis.   ALLERGIES:  SULFA.   MEDICATIONS:  1. Aspirin 81 mg daily.  2. Lisinopril 20 mg b.i.d.  3. Pravastatin 20 mg daily.  4. Zetia 10 mg daily.  5. Protonix 40 mg daily.  6. Sertraline 50 mg daily.  7.  Singulair 10 mg daily.  8. Calcium with vitamin D.  9. Fish oil.  10.Centrum.   PHYSICAL EXAMINATION:  VITAL SIGNS:  Temperature 97.9, blood pressure  124/61, pulse 65, respirations 16, and oxygen saturation 94% on room  air.  GENERAL:  In no acute distress.  HEENT:  Normocephalic and atraumatic.  Pupils are equal, round, and  reactive to light and accommodation.  Extraocular movements are intact.  NECK:  Supple.  No lymphadenopathy.  No jugular venous distention or  mass.  CARDIOVASCULAR:  Regular rate and rhythm.  No murmurs, rubs, or gallops.  CHEST:  Clear to auscultate bilaterally.  ABDOMEN:  Positive bowel sounds.  Soft, nontender, and nondistended.  EXTREMITIES:  No clubbing or cyanosis, 1+ lower extremity edema, 2+  dorsalis pedis pulses.  MUSCULOSKELETAL:  There is tenderness to palpation over the inferior  pubic ring.   LABORATORY STUDIES:  None.   X-ray:  Right-sided superior inferior pubic rami fractures.  Hip was  negative for fracture.   ASSESSMENT  AND PLAN:  1. We will admit the patient to [facility] Service to Dr.      Lyman Speller care.   1. Right-sided pubic rami fractures.  Currently, the patient has      significant pain, is unable to go home or bare weight; therefore,      she will  need to be admitted for pain control.  We will place her      on Dilaudid 1 mg IV every 4 hours p.r.n. and Vicodin 5/500 mg      tablets every 6 hours p.r.n.  The patient will need to be seen and      evaluated by physical therapy and occupational therapy in the      morning.  Additionally, we will need a consult to orthopedics and I      have already called them and are currently awaiting the return of      their call.  We will check her CBC, so we have a baseline      hemoglobin in case she has the loose of blood from her pelvic      fracture.  She would probably benefit from osteoporosis treatment      with a bisphosphonate at discharge.   1. We will continue the  patient on her home medications, which include      treatment for hypertension and hyperlipidemia.   1. Fluids, electrolytes, nutrition.  Saline lock IV fluids.  Regular      diet.   1. Deep vein thrombosis prophylaxis with Lovenox.      Crissie Reese, MD  Electronically Signed     SJT/MEDQ  D:  07/10/2007  T:  07/10/2007  Job:  [id number]

## 2010-06-23 NOTE — Consult Note (Signed)
[name], [name]              ACCOUNT NO.:  [id number]   MEDICAL RECORD NO.:  [id number]          PATIENT TYPE:  INP   LOCATION:  3729                         FACILITY:  [facility]   PHYSICIAN:  Peter C. Johnsie Cancel, MD, FACCDATE OF BIRTH:  [birth year]   DATE OF CONSULTATION:  05/08/2007  DATE OF DISCHARGE:                                 CONSULTATION   [name] is a 77 year old patient of Dr. Verl Blalock, whom we were asked to  see for bradycardia.  The patient was admitted to the hospital secondary  to an overdose. She is on chronic Cardizem for palpitations and  hypertension.  She awoke last night with pain in her back. She is to  have surgery with Dr. Carloyn Manner for disk problems.  She mistakenly took an  additional Cardizem 240 mg instead of her pain pill. The patient  apparently normally has some bradycardia. When she was seen in the ER  her heart rate got into the 30s.  She was asymptomatic with this. I  reviewed the telemetry strips. She was actually sinus bradycardia the  entire time with maybe two junctional escape beats.  There is no  evidence of high-grade heart block.   Talking to the patient, she occasionally gets palpitations.  They are in  frequent. They are not prolonged. She has no documented history of PAF  that I can see.   The patient has not had any significant syncope or falls.   Her activity was limited by back pain.  There is no previous history of  artery disease.   Her history of palpitations and bradycardia goes back as far as 2007 as  far as I can see. She has had a previous echocardiogram with a normal EF  and I believe her last Myoview was March of 2005 with no evidence of  coronary disease and EF of 71%.  The patient's heart rate has remained  sinus bradycardia in the high 40s to low 50s.   She has not received any calcium   As far as I can tell from the ER report there was no attempt to see if  there was any response to atropine.   REVIEW OF SYSTEMS:   Otherwise remarkable for back pain.  She was  supposed to have back surgery with Dr. Carloyn Manner on May 17, 2007.   PAST MEDICAL HISTORY:  Her past medical history is otherwise remarkable  for history of previous chest pain and palpitations.  No evidence of  structural heart disease, negative stress test January of 2008 as well  as 2005.  History of tobacco abuse, obesity, hyperlipidemia,  hypertension, question of COPD, previous smoking, chronic back pain.   SOCIAL HISTORY:  The patient has previous history of smoking.  She lives  by herself and a 50-some-odd-year-old retarded son who has always been  with her.  Activity is limited.  She is sedentary.  She does not drink.   FAMILY HISTORY:  Unremarkable.   MEDICATIONS ON ADMISSION:  Included Protonix 40 a day, sertraline 50 a  day, Zetia 10 a day, __________40-day, Cardizem 240 a day being held, an  aspirin a day, fish oil, glucosamine, Centrum.   ALLERGIES:  The patient is allergic to SULFA, it makes her feel sick to  her stomach.   PHYSICAL EXAMINATION:  GENERAL APPEARANCE:  Her exam is remarkable for a  healthy-appearing elderly white female in no distress.  VITAL SIGNS:  Blood pressure is 130/60, pulse is sinus brady at a rate of 52,  afebrile, respiratory rate 14.  PSYCHIATRIC:  Affect appropriate.  HEENT:  Unremarkable.  Carotids are normal without bruit.  No  lymphadenopathy, thyromegaly, or JVP elevation.  LUNGS:  Clear with good diaphragmatic motion.  No wheezing.  CARDIOVASCULAR:  S1, S2. Normal heart sounds.  PMI normal.  ABDOMEN:  Benign.  She is status post appendectomy, cholecystectomy and  partial hysterectomy.  No AAA.  No bruit.  No tenderness, no  hepatosplenomegaly, or hepatojugular reflux.  EXTREMITIES:  Pulses are intact.  No edema.  NEURO:  Nonfocal.  SKIN:  Warm and dry.  No muscular weakness.   ELECTROCARDIOGRAM:  Her EKG showed sinus bradycardia with no acute  changes.  There is no evidence of heart block.    LABORATORY RESULTS:  Chest x-ray shows a prominent right hilum. Lab work  is remarkable for negative enzymes times 2, potassium of 3.9.   IMPRESSION:  1. Bradycardia.  The patient should be watched on tele for an      additional 48 hours.  I will give her to amps of calcium just to      try to reverse the calcium channel blocker sooner.  She is      asymptomatic and has no evidence of high-grade heart block. An      outpatient event monitor may be a worthwhile. I will leave this up      to Dr. Verl Blalock to arrange if she thinks she needs this.  2. Hypertension, currently well controlled.  I would continue ACE      inhibitor. I suspect that it would be best to send the patient home      without Cardizem and monitor her palpitations.  I did note that she      had four beats of nonsustained VT on one of her monitor strips and      it may be that her palpitations are ventricular in nature and not      atrial. Continue low-salt diet.  3. The need for back surgery. The patient has no previous history of      coronary disease. Dr. Verl Blalock had cleared her for surgery during the      last office visit. I do not think that this episode should change      that.  4. Previous history of smoking, chronic obstructive pulmonary disease.      Continue inhalers p.r.n. Consider baseline PFT's pre and post      bronchodilator, particularly prior to any surgery.  5. Abnormal chest x-ray with question prominent right hilum.  Consider      lateral chest x-ray and follow-up CT scan depending on that. The      patient is a previous smoker. Will leave this up to the primary      service to arrange prior to discharge.   We would be happy to follow the patient along but I do not see an  indication currently for pacemaker at this time.      Wallis Bamberg. Johnsie Cancel, MD, [facility]  Electronically Signed     PCN/MEDQ  D:  05/08/2007  T:  05/08/2007  Job:  [id number]

## 2010-06-23 NOTE — Discharge Summary (Signed)
[name], [name]              ACCOUNT NO.:  [id number]   MEDICAL RECORD NO.:  [id number]          PATIENT TYPE:  INP   LOCATION:  [id number]                         FACILITY:  [facility]   PHYSICIAN:  Annita Brod, M.D.DATE OF BIRTH:  1927-10-29   DATE OF ADMISSION:  06/06/2008  DATE OF DISCHARGE:  06/11/2008                               DISCHARGE SUMMARY   PRIMARY CARE PHYSICIAN:  Luanne Bras, nurse practitioner of [facility] at  [facility], supervised by Dr. Rita Ohara.   CONSULTANT IN THE CASE:  Leta Baptist, MD, Ear, Nose, and Throat.   DISCHARGE DIAGNOSES:  1. Odynophagia.  2. Pneumonia, questionable secondary to pill aspiration.  3. Abnormal cardiac enzymes, felt to be secondary to pulmonary issues.  4. Hypertension.  5. Diet-controlled diabetes.  6. History of paroxysmal atrial fibrillation.  7. History of hypertension.  8. History of gastroesophageal reflux disease.   DISCHARGE MEDICATIONS:  For this patient are as follows:  1. Pravastatin 20 p.o. daily.  2. Protonix 40 p.o. daily.  3. Singulair 10 p.o. daily.  4. Aspirin 81 p.o. daily.  5. Iron 325 p.o. daily.  She previously was on lisinopril 20 mg p.o. b.i.d., this being decreased  down to lisinopril 20 mg daily, which can be changed back after she  follows with her PCP, Luanne Bras, sometime within the next 7 days.  The patient will also follow up with Dr. Jenell Milliner, her cardiologist,  in the next 2 weeks.   DISCHARGE DIET:  Low-sodium, caffeine-free diet.   ACTIVITY:  As per home health PT, OT with assisted block and increasing  activity slowly.   Her new medications will be Avelox 400 mg p.o. daily x2 days and Vicodin  5/500 every 6 hours p.r.n. for pain.   HOSPITAL COURSE:  The patient is a 77 year old white female with past  medical history of atrial fibrillation who presented on April 29 with  complaints that the pill was stuck in her throat.  She started to  develop some shortness of breath and cough.   She presented to the  emergency room.  It occurred on 28th and she was evaluated by ENT at  that time.  CT scan showed some mild subglottic narrowing, no foreign  body.  Dr. Benjamine Mola had performed a limited endoscopy and bronchoscopy as  well as laryngoscopy.  The patient was discharged to home and then came  back with complaining of worsening shortness of breath and wheezing.  A  repeat ENT consult was done and the patient underwent endoscopy and  bronchoscopy which noted no foreign bodies, but large amount of mucous  and inflammation of the trachea and main bronchus, and the patient  presented here to the emergency room.  Dr. Benjamine Mola from ENT was consulted.  An emergent bronchoscopy and endoscopy was done.  She was noted to have  some purulent secretions within the trachea.  She was admitted and  started on IV antibiotics for suspected aspiration pneumonia versus  bronchiectasis.  It was felt that possibly this may be secondary to pill  aspiration.  She was continued on IV antibiotics.  She continued to  improve everyday.  A CT scan of the chest on admission noted some  chronic pulmonary scarring, again continued bronchiectasis, but no  evidence of any acute other findings.  Her CT scan of the neck showed no  acute findings and a swallowing modified barium test noted no overt  signs of oropharyngeal dysphagia.  They did recommend some swallowing  interventions in terms of regular diet, thin liquid, small bites with  sips, and 2 swallows with every amount of food taken in and intermittent  throat clearing and they recommended putting her pills in pureed.  The  patient completed 5 days of antibiotic course and the plan will be to  discharge her on 2 more days of p.o. Avelox for a total of 7 days  therapy.  In regard to the patient's abnormal cardiac enzymes, initially  she was evaluated.  She was noted to have on hospital day 2, a troponin  of 0.43.  She reportedly had a history of CAD, but in  review of [facility] record, she has a history of tachy palpitations,  hypertension, and atrial fibrillation with no signs of any CAD.  She has  been on aspirin with no Coumadin and has been doing well with this.  A 2-  D echocardiogram was ordered, which was done on June 07, 2008.  She was  noted to have wall thickness increase in pattern of moderate LVH and an  EF of 65%, otherwise unremarkable.  Her left atrium was noted to be mild-  to-moderately dilated.  Right ventricle cavity size is mild-to-  moderately dilated.  Her ejection fraction was no change from previous.  Given the concerns about her lung issues and concern perhaps maybe this  was pulmonary hypertension, it was felt that the patient will follow up  with Dr. Verl Blalock in 2 weeks' time after her acute respiratory issues have  settled.  In regards to the paroxysmal AFib, this was stable.  In  regards to the diabetes mellitus, this was stable.  The patient was  evaluated by PT and Occupational Therapy who recommended home health  which the patient will be set up for.  As of May 4, the patient was  feeling somewhat better.  Her blood pressure has been slightly soft with  a systolic in the low [id number] to high 90s prior to her taking her  lisinopril.  We will decrease her dose from 20 b.i.d. to 20 daily until  she sees Luanne Bras, her PCP, in the next 7 days.  At that time, her  medication can be resumed at her normal dose if her pressure has  improved.  The patient has overall otherwise improved.  Her activity  will be slowly increased and she is being discharged to home.      Annita Brod, M.D.  Electronically Signed     SKK/MEDQ  D:  06/11/2008  T:  06/12/2008  Job:  [id number]   cc:   Thomas C. Verl Blalock, MD, [facility]  Luanne Bras, NP

## 2010-06-23 NOTE — Op Note (Signed)
[name], [name]              ACCOUNT NO.:  [id number]   MEDICAL RECORD NO.:  [id number]          PATIENT TYPE:  INP   LOCATION:  3106                         FACILITY:  [facility]   PHYSICIAN:  Elizabeth Sauer, M.D.      DATE OF BIRTH:  10/31/1927   DATE OF PROCEDURE:  05/17/2007  DATE OF DISCHARGE:                               OPERATIVE REPORT   PREOPERATIVE DIAGNOSIS:  Spondylosis with spinal stenosis L4-5.   POSTOPERATIVE DIAGNOSIS:  Spondylosis with spinal stenosis L4-5.   PROCEDURES:  L4-5 laminotomy, foraminotomy done bilaterally.   DICTATING DOCTOR:  Elizabeth Sauer, MD.   ANESTHESIA:  General endotracheal.   PREP:  __________  with alcohol wipe.   COMPLICATIONS:  None.   NURSE ASSISTANT:  [place]   DOCTOR ASSISTANT:  [name], M.D.   BODY TEXT:  A 77 year old lady with severe spondylosis and neurogenic  claudication taken to the operating room where spinal anesthesia was  induced, placed prone on the operating table.  Following shave, prep,  and drape in the usual sterile fashion, skin was infiltrated with 1%  lidocaine with 1:400,000 epinephrine.  The skin was incised from the  middle of L3 to the middle of L5, and the lamina of L4-L5 were exposed  bilaterally in subperiosteal plane.  Several images were taken to  confirm correct level owing to the patient's somewhat peculiar anatomy  as well as osteopenia making it difficult to visualize radiographically.  However, with persistence we were able to positively identify that we  are in the lamina of L4, and a laminotomy and foraminotomy was carried  out using the high-speed drill and the [place] up to the top of the  ligamentum flavum that was removed in a retrograde fashion.  Spinous  processes were undercut as was the lamina of L5 to allow plenty of room  for the nerve roots.  Following complete decompression, the wound was  irrigated, and hemostasis assured.  Depo-Medrol-soaked pad was placed  into the  laminotomy defects.  Successive layers of 0 Vicryl, 2-0 Vicryl,  and 3-0 nylon were used to close.  Betadine and Telfa dressing was  applied __________  OpSite, and the patient returned to recovery room in  good condition.            ______________________________  Elizabeth Sauer, M.D.     MWR/MEDQ  D:  05/17/2007  T:  05/18/2007  Job:  [id number]

## 2010-06-23 NOTE — Assessment & Plan Note (Signed)
[facility]                            CARDIOLOGY OFFICE NOTE   NAME:[name], Michelle MUSTAIN                     MRN:          [id number]  DATE:03/28/2007                            DOB:          January 30, 1928    HISTORY:  [name] returns today for further management of her  palpitations, atypical chest discomfort and hypertension.   We actually did a 2-D echocardiogram which showed moderate left  ventricular hypertrophy with a very vigorous left ventricle.  Her EF was  about 65-70%.   We added diltiazem extended release 240 mg a day both for her  palpitations and better blood pressure control.   She feels better and in fact she is having less and less palpitations.  However, in the last couple of days she has had a sinus cold and has  some positional dizziness.   We checked orthostatics today in the office and she is not orthostatic.  Her blood pressure actually increased with sitting though she was a bit  dizzy.  Her baseline blood pressure was 143/81.  Pulse of 60 and  regular, which is much better.  The rest of her exam is unchanged.   [name] would like to go ahead and proceed in March with back  surgery with Dr. Glenna Fellows.  We will clear her today for that.  I would  like to know when she comes in so we can monitor and follow along with  him.  I will see her back again in 3 months.     Thomas C. Verl Blalock, MD, [facility]  Electronically Signed    TCW/MedQ  DD: 03/28/2007  DT: 03/29/2007  Job #: [id number]   cc:   Elizabeth Sauer, M.D.

## 2010-06-23 NOTE — Consult Note (Signed)
NAMELAKEN, HAYES              ACCOUNT NO.:  [id number]   MEDICAL RECORD NO.:  [id number]          PATIENT TYPE:  INP   LOCATION:  5118                         FACILITY:  [facility]   PHYSICIAN:  Susa Day, M.D.    DATE OF BIRTH:  04-Apr-1927   DATE OF CONSULTATION:  DATE OF DISCHARGE:                                 CONSULTATION   CHIEF COMPLAINT:  Right groin pain.   HISTORY:  This is a 77 year old female who fell, slipped in the kitchen,  fell on the floor.  No loss of consciousness, no dizziness, or shortness  of breath.  She was admitted to the hospital by a medical physician who  consulted [place] for orthopedic management of her fractures.   REVIEW OF SYSTEMS:  Otherwise negative.   PAST MEDICAL HISTORY:  1. Hypertension.  2. Osteoarthritis.   SURGICAL HISTORY:  She is status post a lumbar decompression.   SOCIAL HISTORY:  Tobacco negative.  Alcoholic beverages negative.   ALLERGIES:  SULFA.   MEDICATIONS:  Lisinopril, pravastatin, sertraline, Protonix, Singulair,  and Darvocet.   PHYSICAL EXAMINATION:  Healthfully, minimal distress.  Mood and affect  is appropriate.  Nontender thoracic spine.   Dictation ended at this point.      Susa Day, M.D.  Electronically Signed     JB/MEDQ  D:  07/11/2007  T:  07/11/2007  Job:  [id number]

## 2010-06-23 NOTE — H&P (Signed)
[name], [name]              ACCOUNT NO.:  [id number]   MEDICAL RECORD NO.:  [id number]          PATIENT TYPE:  OBV   LOCATION:  2550                         FACILITY:  [facility]   PHYSICIAN:  Farris Has, MDDATE OF BIRTH:  Jan 08, 1928   DATE OF ADMISSION:  06/06/2008  DATE OF DISCHARGE:                              HISTORY & PHYSICAL   CHIEF COMPLAINT:  Something is stuck in my throat.   HISTORY OF PRESENT ILLNESS:  The patient is a pleasant 77 year old  female with past medical history significant for self-reported coronary  artery disease although I do not have record of this, history of  paroxysmal atrial fibrillation and history of inferior T-wave inversion  in June 2009.  The patient was doing well at home up until this point.  She was taking her medications. She took a glucosamine pill and felt  like it got stuck in her tube. She started to develop shortness of  breath and cough. She presented to the emergency department.  This  apparently occurred on the 28th. Since then, she has been evaluated by  Dr. Benjamine Mola. A CT scan showed mild subglottic narrowing with no foreign  bodies and she was consulted. He performed a limited endoscopy and  bronchoscopy, as well as laryngoscopy. At first he performed a  fiberoptic laryngoscopy. Apparently the patient was discharged to home  and then re-presented with above mentioned complaints of worsening  shortness of breath and wheezing at which point a repeat ENT consult  done and they were able to do more invasive procedures and she performed  endoscopy and possible bronchoscopy which showed no foreign body but  rather large amount of mucous and inflammation in the trachea and main  bronchus. The patient tolerated the procedure and actually started to  feel better after she removed large amount of mucous. She is currently  on 2 liters, sating 96% and she reports significant improvement but  still needs to be observed at which point  [facility] hospitalist was called  for admission.  The patient also reports that since she is swallowed  that pill, she had some left sided chest pain that lasted for about 10  minutes, perhaps had occurred after a significant coughing spell. No  nausea, no vomiting, no diarrhea.   REVIEW OF SYSTEMS:  Otherwise review of systems unremarkable.   PAST MEDICAL HISTORY:  1. Hyperlipidemia.  2. Hypertension.  3. History of arthritis.  4. Chronic anemia.  5. Questionable coronary artery disease.  6. Borderline diabetes, diet controlled.  7. Questionable asthma.  8. Paroxysmal atrial fibrillation.  9. Status post hip fracture.   SOCIAL HISTORY:  The patient currently does not smoke or drink. Does not  abuse drugs but she used to smoke 40 years ago for about 10 years. She  lives with her son who is handicapped but able to help her somewhat.   FAMILY HISTORY:  Noncontributory.   ALLERGIES:  SULFA.   CURRENT MEDICATIONS:  1. Lisinopril 20 mg twice a day.  2. Pravastatin 20 mg daily.  3. Protonix 40 mg daily.  4. __________50 mg daily.  5.  Singulair 10 mg daily.  6. Aspirin 81 mg daily.  7. Iron 325 mg.   PHYSICAL EXAMINATION:  VITAL SIGNS:  Temperature 98.1, blood pressure  141/75, pulse 88, respirations 18, sating 96% on 2 liters. The patient  has been afebrile throughout her stay.   LABORATORY DATA:  Hemoglobin 12.6, sodium 140, potassium 4.1, creatinine  1.2. Unsure what her baseline is.   Chest x-ray showed filtrate, right worse than left. CT scan of the neck  is unremarkable.  CT scan of her chest showed bronchiectasis right worse  than left with mucous plugging and possible infiltrates.   ASSESSMENT AND PLAN:  This is a 77 year old female with possible episode  of aspiration after she swallowed a large pill. Reports history of  bronchiectasis per her CT scan and an episode of chest pain.  1. Question of aspiration pneumonia versus bronchiectasis.  Will treat      with Zosyn.  Chronic bronchiectasis could be secondary to silent      aspiration. Will have swallow evaluation in the morning. Will start      on guaifenesin, albuterol  as needed, and Atrovent scheduled .      Would recommend pulmonology consult in a.m., sputum cultures.  Will      make sure she is on Protonix.  2. History of hypertension, continue lisinopril.  3. History of hyperlipidemia, continue pravastatin.  4. Chest pain.  Sounds somewhat atypical, likely secondary to repeated      coughing and a muscle strain but given unclear history of coronary      artery disease, will cycle cardiac enzymes X3. Will do serial EKG.      Obtained EKG now. Admit to telemetry floor.  5. History of paroxysmal atrial fibrillation. Will obtain baseline EKG      now. Watch for signs of paroxysmal atrial fibrillation. She      currently does not seem to be taking anything for rate control and      appears to be in sinus but given pulmonary disease, this could      trigger a potential run of atrial fibrillation. Will watch for      this.  6. Prophylaxis. Protonix plus Lovenox.  7. Borderline diabetes.  Will start on sliding scale for now      _______________      Farris Has, MD  Electronically Signed     AVD/MEDQ  D:  06/06/2008  T:  06/06/2008  Job:  [id number]

## 2010-06-23 NOTE — Assessment & Plan Note (Signed)
[name]                            CARDIOLOGY OFFICE NOTE   NAME:[name], [name]                     MRN:          [id number]  DATE:02/21/2007                            DOB:          [birth year]    Ms. [name] returns today.   PROBLEM LIST:  1. Palpitations.  Event recorder in the past showed PVCs and PVCs      which are fairly infrequent.  She has a baseline sinus bradycardia.      She is not having too many symptoms of present.  2. Chest discomfort.  She has had a negative stress Myoview February 11, 2006; no ischemia.  EF 75%.  3. Dyspnea on exertion.  2-D echocardiogram in the past in 2005 showed      moderate elevation in pulmonary pressures of 30-40 mm.  4. Remote tobacco.  5. Obesity.  6. Hyperlipidemia.  7. Chronic obstructive pulmonary disease.   She may have to have lumbar spine surgery with Dr. Glenna [name]. She is  hoping this can be treated medically.   MEDICATIONS:  1. Protonix 40 mg a day.  2. Zetia 10 mg a day.  3. Singulair 10 mg a day.  4. Pravastatin 20 mg daily.  5. Lisinopril 20 mg p.o. b.i.d.  6. Sertraline 50 mg daily.  7. Calcium.  8. Fish oil.  9. Aspirin 81 mg a day.  10.Centrum Silver.    She denies orthopnea, PND.  She does have some minimal peripheral edema  at the end of the day.   Her blood pressure is 156/88, pulse 78 and regular.  She is in sinus  rhythm with an incomplete right bundle and no significant change from  2005.  Her weight is 187 which is stable from January 2008.  HEENT:  Normocephalic, atraumatic.  PERRLA.  Extraocular movements are  intact.  Sclerae are clear.  Face symmetry is normal.  Carotid upstrokes  are equal bilaterally without bruits, no JVD.  Thyroid is not enlarged.  Trachea is midline.  LUNGS:  Clear.  HEART:  Reveals a nondisplaced PMI; there is no gallop.  ABDOMEN:  Soft, good bowel sounds.  No midline bruit.  EXTREMITIES: Reveal no cyanosis, clubbing but there is 1+  edema.  Pulses  are intact.  She has varicose veins; no sign of DVT.  NEURO:  Exam is intact.   ASSESSMENT/PLAN:  [name] seems to be stable from our standpoint.  It has been over 3 years since she has had a 2-D echocardiogram so we  will repeat this in case she has to have surgery to better manage her  volume and avoid any kind of problems in the perioperative period.   Assuming this is stable, I will see her in the hospital after surgery  for cardiac management.  We will see her back in a year otherwise.     Michelle C. Verl Blalock, MD, [facility]  Electronically Signed    TCW/MedQ  DD: 02/21/2007  DT: 02/21/2007  Job #: [id number]   cc:   [name], M.D.  [facility] at  [facility]

## 2010-06-23 NOTE — Discharge Summary (Signed)
[name], [name]              ACCOUNT NO.:  [id number]   MEDICAL RECORD NO.:  [id number]          PATIENT TYPE:  INP   LOCATION:  5118                         FACILITY:  [place]   PHYSICIAN:  Irine Seal, MD    DATE OF BIRTH:  05/11/1927   DATE OF ADMISSION:  07/10/2007  DATE OF DISCHARGE:  07/14/2007                               DISCHARGE SUMMARY   ADDENDUM:  This is an addendum to job number [id number].  This covers the  periods between June 3 and June 5.   PRIMARY CARE PHYSICIAN:  Dr. Conchita Paris of [place] at [facility].   ORTHOPEDIC CONSULTANT:  Dr. Susa Day of Orth[facility].   DISCHARGE DIAGNOSES:  1. Right-sided superior and inferior pubic rami fractures.  2. History of paroxysmal atrial fibrillation.  3. History of hypertension.  4. History of hyperlipidemia.   DISCHARGE MEDICATIONS:  1. Protonix 40 mg daily.  2. Singular 10 mg daily.  3. Pravastatin 20 mg daily.  4. Lisinopril 10 mg daily.  5. Zoloft 50 mg daily.  6. Os-Cal D daily.  7. Fish oil 1000 daily.  8  Aspirin 81 mg daily.  1. Multivitamin daily.  2. Zetia 10 mg daily.  3. Cardizem 240 mg daily.  4. Percocet 5/325 p.o. q.4 h. p.r.n.   ADDENDUM:  The patient was for inpatient rehab and I had initially been  consulted to see whether the patient was appropriate for placement.  It  was deemed the patient was not appropriate for inpatient rehab and as  such, a skilled nursing facility was sought out.  The patient remained  stable throughout the rest of the hospitalization between the periods of  June 3 through June 5.  The patient participated in PT and OT and was  stable for discharge to a skilled  nursing facility.  The patient will be discharged in stable and improved  condition.  For the rest of the patient's hospitalizations, please see  prior discharge summary per Dr. Maryland Pink on job number [id number].   It was a pleasure taking care of Ms. [name].      Irine Seal,  MD  Electronically Signed     DT/MEDQ  D:  07/14/2007  T:  07/14/2007  Job:  [id number]   cc:   Susa Day, M.D.

## 2010-06-26 NOTE — Op Note (Signed)
[name], [name]              ACCOUNT NO.:  [id number]   MEDICAL RECORD NO.:  [id number]          PATIENT TYPE:  OIB   LOCATION:  U323201                         FACILITY:  [facility]   PHYSICIAN:  Jonne Ply, MD   DATE OF BIRTH:  11-08-1927   DATE OF PROCEDURE:  10/19/2005  DATE OF DISCHARGE:                                 OPERATIVE REPORT   PREOPERATIVE DIAGNOSIS:  Symptomatic cholelithiasis.   POSTOPERATIVE DIAGNOSIS:  Symptomatic cholelithiasis.   PROCEDURE:  Laparoscopic cholecystectomy.   SURGEON:  Jonne Ply, M.D.   ASSISTANT:  Lew Dawes. Rosana Hoes, M.D.   ANESTHESIA:  General.   DESCRIPTION:  The patient was taken to the operating room and placed in the  supine position. After adequate general anesthesia was induced using  endotracheal tube, the abdomen was prepped and draped in a normal sterile  fashion.  Using a transverse supraumbilical incision, I dissected down to  the fascia.  The fascia was opened vertically.  An 0 Vicryl pursestring  suture was placed on the fascial defect.  Pneumoperitoneum was obtained.  An  11 mm trocar was placed under direct vision in the subxiphoid region and two  5 mm trocars were placed in the right abdomen.  The gallbladder was  identified and retracted cephalad.  A few adhesions were taken down from the  sidewall of the gallbladder.  A critical view was obtained of the cystic  duct and its junction with the gallbladder.  It was triply clipped and  divided.  The cystic artery was identified, triply clipped, and divided.  The gallbladder was taken off the gallbladder bed using Bovie electrocautery  and removed through the umbilical port.  Adequate hemostasis was assured.  Pneumoperitoneum was released.  Trocars were removed.  The incisions were  closed with subcuticular 4-0 Monocryl.  Steri-Strips and sterile dressings  were applied.  The patient tolerated the procedure well and went to the PACU  in good condition.      Jonne Ply, MD  Electronically Signed     KRE/MEDQ  D:  10/19/2005  T:  10/19/2005  Job:  [id number]

## 2010-06-26 NOTE — Op Note (Signed)
[name], [name]              ACCOUNT NO.:  [id number]   MEDICAL RECORD NO.:  [id number]          PATIENT TYPE:  AMB   LOCATION:  ENDO                         FACILITY:  [facility]   PHYSICIAN:  Wonda Horner, M.D.   DATE OF BIRTH:  Mar 09, 1927   DATE OF PROCEDURE:  11/18/2003  DATE OF DISCHARGE:                                 OPERATIVE REPORT   PROCEDURE PERFORMED:  Esophagogastroduodenoscopy with biopsy for CLO test.   ENDOSCOPIST:  Wonda Horner, M.D.   INDICATIONS FOR PROCEDURE:  Intermittent melena.  Iron deficiency anemia.   Informed consent was obtained after explanation of the risks of bleeding,  infection and perforation.   PREMEDICATIONS:  Fentanyl 80 mcg IV, Versed 8 mg IV.   DESCRIPTION OF PROCEDURE:  With the patient in the left lateral decubitus  position the Olympus gastroscope was inserted into the oropharynx and passed  into the esophagus.  It was advanced down the esophagus and into the stomach  and into the duodenum.  The second portion and bulb of the duodenum looked  normal.  The stomach showed a mild diffuse erythema compatible with  gastritis.  No ulcers or erosions were seen.  The scope was retroflexed.  No  lesions were seen in the fundus or cardia of the stomach.  A biopsy was  obtained from the distal stomach for CLO test to look for evidence of  Helicobacter pylori.  The esophagus was then inspected.  There was a small  hiatal hernia noted.  There was a nonconstricting Schatzke's ring seen  intermittently.  The esophageal mucosa looked normal in its entirety.  The patient tolerated the procedure well without complications.   IMPRESSION:  1.  Gastrititis.  2.  Small hiatal hernia.  3.  Nonconstricting Schatzke's ring.   PLAN:  The CLO test will be checked.       SFG/MEDQ  D:  11/18/2003  T:  11/18/2003  Job:  [id number]   cc:   Leilani Merl, M.D.  [address]  [place] [address]  Fax: [phone]

## 2010-06-26 NOTE — Assessment & Plan Note (Signed)
[facility]                            CARDIOLOGY OFFICE NOTE   NAME:[name], [name]                     MRN:          [id number]  DATE:02/18/2006                            DOB:          1927-10-30    [name] returns today after being seen in the emergency room by Dr.  Kirk Ruths for chest pain.  The chest pain was atypical and  responded to a heating pad.  There was a question it might be some  reflux symptoms.  Cardiac enzymes were negative.   She has no known history of coronary disease.  A stress Myoview was  arranged through the office and was obtained on February 11, 2006.  It  showed an EF of 75% with no ischemia.   She is feeling better now.  She has had no further discomfort.   MEDICATIONS:  1. Protonix 40 mg a day.  2. Zetia 10 mg a day.  3. Singulair 10 mg a day.  4. Pravastatin 20 mg a day.  5. Lisinopril 20 mg b.i.d.  6. Sertraline 50 mg a day.  7. Calcium with __________  and vitamin D.  8. Fish oil 1000 mg a day.  9. Aspirin 81 mg a day.  10.Centrum Silver daily.   Her blood pressure is 150/77, pulse 66 and regular, weight is 188.  She  is in no acute distress.  Carotid upstrokes were equal bilaterally with  a soft systolic sound at the base of the neck on the right.  She has a  soft systolic murmur across the precordium.  S2 splits.  LUNGS:  Clear.  ABDOMEN:  Soft.  EXTREMITIES:  Reveal no edema, pulses are intact.   [name] is doing well.  I have asked her to continue with her  current medications including her H2 or proton pump inhibitor.   I will plan on seeing her back in a year.     Thomas C. Verl Blalock, MD, [facility]  Electronically Signed    TCW/MedQ  DD: 02/18/2006  DT: 02/19/2006  Job #: [id number]   cc:   Conchita Paris, M.D.

## 2010-06-26 NOTE — Op Note (Signed)
NAMELUCELLA, DEPAEPE              ACCOUNT NO.:  [id number]   MEDICAL RECORD NO.:  [id number]          PATIENT TYPE:  AMB   LOCATION:  ENDO                         FACILITY:  [facility]   PHYSICIAN:  Wonda Horner, M.D.   DATE OF BIRTH:  [birth year]   DATE OF PROCEDURE:  11/18/2003  DATE OF DISCHARGE:                                 OPERATIVE REPORT   PROCEDURE PERFORMED:  Colonoscopy with biopsy.   INDICATIONS FOR PROCEDURE:  Iron deficiency anemia.   Informed consent was obtained after explanation of the risks of bleeding,  infection, and perforation.  The procedure was done immediately after an EGD  with no additional medications given.   DESCRIPTION OF PROCEDURE:  With the patient in the left lateral decubitus  position, a rectal exam was performed and no masses were felt.  The Olympus  colonoscope was inserted into the rectum and advanced around the colon to  the cecum.  Cecal landmarks were identified.  The cecum and ascending colon  were normal.  The transverse colon normal.  The descending colon and sigmoid  showed extensive diverticulosis.  In the sigmoid there was a small 3 mm  polyp biopsied off with cold forceps.  The rectum looked normal.  The  patient tolerated the procedure well without complications.   IMPRESSION:  1.  Extensive diverticulosis of the sigmoid and diverticulosis in the      descending colon.  2.  Small polyp in the sigmoid colon.   PLAN:  The pathology will be checked.       SFG/MEDQ  D:  11/18/2003  T:  11/18/2003  Job:  [id number]

## 2010-06-26 NOTE — Consult Note (Signed)
[name], [name]              ACCOUNT NO.:  [id number]   MEDICAL RECORD NO.:  [id number]          PATIENT TYPE:  EMS   LOCATION:  MAJO                         FACILITY:  [facility]   PHYSICIAN:  Michelle Bors. Stanford Breed, MD, FACCDATE OF BIRTH:  [birth year]   DATE OF CONSULTATION:  01/24/2006  DATE OF DISCHARGE:  01/24/2006                                 CONSULTATION   EMERGENCY ROOM CONSULT NOTE   PRIMARY CARDIOLOGIST:  Dr. Verl [name].   PRIMARY CARE PHYSICIAN:  Dr. Conchita [name] at [facility] at  [facility].   PATIENT PROFILE:  [age]-year-old Caucasian female with prior  history of chest pain and palpitations, who presents with chest pain.   PROBLEM LIST:  1. History of chest pain.      a.     April 19, 2003, adenosine Myoview, no evidence of ischemia       or infarct.  EF 71%.  2. History of palpitations with baseline bradycardia.  3. History of dyspnea with an echocardiogram in 2005 showing a normal      LV with mild MR and AI and RVSP of 30-40 mmHg.  4. Remote tobacco abuse, 20 pack history quitting approximately 40      years ago.  5. Hypertension.  6. GERD.   HISTORY OF PRESENT ILLNESS:  [age]-year-old Caucasian female with  history of chest pain and palpitations and baseline bradycardia.  She  was last seen by Dr. Jenell [name] in March of 2005 and at that time had  an adenosine Myoview, which was negative.  She had done well since then,  but this morning awoke at approximately 3 a.m. with 5/10 retrosternal  chest pain with radiation to the left shoulder without associated  symptoms, except maybe some mild palpitations or sensation that her  heart was skipping, but nothing out of the ordinary.  She took 2 baby  aspirin and put a heating pad on her left shoulder with relief over the  span of about 3 hours.  She then fell back to sleep at about 6 a.m. and  awoke at 7 a.m.  Following awakening, she called her primary care  [name]'s office and was  advised to come into the ED.  She presented to  the ED at approximately 9 a.m. and up to this point, her point of care  markers are negative x3.  She is no pain free and ECG is without any ST  or T changes.  She denies any PND, orthopnea, dizziness, syncope, edema  or early satiety.   ALLERGIES:  SULFA.   HOME MEDICATIONS:  1. Lisinopril 40 mg b.i.d.  2. Pravastatin 20 mg daily.  3. Protonix 40 mg daily.  4. Sertraline 50 mg daily.  5. Singulair 10 mg daily.  6. Zetia 10 mg daily.   FAMILY HISTORY:  Mother died of heart troubles at age 68.  Father died  of old age at age 28, but notably had a stroke in his life time.  She  has 5 brothers and 7 sisters.  There is a brother who had CAD and an MI  and other  siblings who have had strokes.  No diabetes.   SOCIAL HISTORY:  She lives in [place] with her 75 year old  handicapped son, whom she helps take care of.  She is retired.  She has  a 20 pack year history of tobacco abuse, quitting approximately 40 years  ago.  She denies any alcohol or drugs.  She does not routinely exercise,  although remains active around the home.   REVIEW OF SYSTEMS:  Positive for chest pain and palpitations, as well as  bilateral shoulder aches, right greater than left.  Otherwise, all  systems reviewed and negative.   PHYSICAL EXAMINATION:  VITAL SIGNS:  Temperature 97.5.  Heart rate 69.  Respirations 16.  Blood pressure 128/70.  Pulse oximetry 95% on room  air.  GENERAL:  Pleasant, white female in no acute distress.  Awake, alert and  oriented x3.  NECK:  Normal carotid upstrokes.  No bruits or JVD.  LUNGS:  Respirations regular, nonlabored.  Clear to auscultation.  CARDIAC:  Regular S1, S2.  No S3, S4 or murmurs.  ABDOMEN:  Round, soft, nontender, nondistended.  Bowel sounds present  x4.  EXTREMITIES:  Warm, dry and pink.  No clubbing, cyanosis or edema.  Dorsalis pedis, posterior tibial pulses 2+ and equal bilaterally.  HEENT:  Atraumatic,  normocephalic.Marland Kitchen  NEURO:  Grossly intact and nonfocal.  SKIN:  Warm and dry without lesions or masses.   Chest x-ray shows no acute findings and no CHF.  EKG shows sinus brady  with sinus arrhythmia at a rate of 56 beats per minute.   LAB WORK:  Hemoglobin 11.8, hematocrit 34.5, WBC 6.9, platelets 189,  sodium 141, potassium 4.0, chloride 109, CO2 is 23.5, BUN 18, creatinine  1.0, glucose 116, total bilirubin 0.8, alkaline phosphatase 68, AST 24,  ALT 18, total protein 6.4, albumin 3.8.  CK-MB 1.3, 1.7, 2.0.  Troponin-  I less than 0.05 x3.   ASSESSMENT/PLAN:  1. Chest pain, somewhat atypical.  It was relieved with a heating pad.      She had a negative Myoview in 2005.  Duration of pain this morning      was 3 hours.  The cardiac enzymes are negative and ECG is without      any acute changes.  She is currently pain free.  Plan to check 1      more set of cardiac enzymes here in the emergency department and if      normal she is okay for discharge.  Arrangement has been made for      follow up with Dr. Jenell [name] on January 9 at 1545.  We have also      contacted the office to contact the patient regarding an adenosine      Myoview prior to her January 9 appointment.  We will, otherwise,      continue her home meds.  She is not a beta blocker candidate      secondary to baseline bradycardia.  2. Hypertension.  Stable.  Continue ACE inhibitor.  3. Hyperlipidemia.  Continue statin/Zetia.  Outpatient followup lipids      and liver function tests by primary care.  4. Gastroesophageal reflux disease/history of gastritis.  Continue      proton pump inhibitors.      [name], ANP      Michelle Stanford Breed, MD, [facility]  Electronically Signed    CB/MEDQ  D:  01/24/2006  T:  01/25/2006  Job:  [id number]

## 2010-07-09 ENCOUNTER — Encounter: Payer: Self-pay | Admitting: *Deleted

## 2010-07-09 ENCOUNTER — Ambulatory Visit (INDEPENDENT_AMBULATORY_CARE_PROVIDER_SITE_OTHER): Payer: Medicare Other | Admitting: Cardiology

## 2010-07-09 VITALS — BP 130/71 | HR 66 | Ht 61.0 in | Wt 183.0 lb

## 2010-07-09 DIAGNOSIS — R42 Dizziness and giddiness: Secondary | ICD-10-CM | POA: Insufficient documentation

## 2010-07-09 NOTE — Patient Instructions (Signed)
Your physician recommends that you schedule a follow-up appointment in: 1 year with Dr. Verl Blalock Your physician recommends that you schedule a follow-up appointment  With your primary care Dr. Chapman Fitch

## 2010-07-09 NOTE — Progress Notes (Signed)
HPI [name] comes today with a chief complaint of persistent dizziness. It is associated with some mild vertigo. It is intermittent and sporadic. It is not made worse by standing up.  She denies palpitations or chest pain.  She was evaluated in emergency department which I have reviewed. Blood work was unremarkable and at head CT showed no acute abnormality or stroke. She does have atherosclerotic changes.  She apparently saw a neurologist who felt that she a sinus infection.  EKG from emergent department showed normal sinus rhythm with no acute changes. Past Medical History  Diagnosis Date  . Atrial fibrillation   . Palpitations   . Unspecified essential hypertension   . Personal history of other diseases of digestive system   . Other abnormal glucose   . Odynophagia     Past Surgical History  Procedure Date  . Laryngoscopy / bronchoscopy / esophagoscopy 2010    Dr. Benjamine Mola  . Laminectomy 05/2007    foraminotomy, L4-5 laminectomy  . Cholecystectomy 10/2005    Dr. Effie Shy  . Colonoscopy w/ biopsies 2005    Dr. Bing Plume  . Abdominal hysterectomy   . Appendectomy     Family History  Problem Relation Age of Onset  . Heart disease Mother   . Diabetes Father     History   Social History  . Marital Status: Widowed    Spouse Name: N/A    Number of Children: N/A  . Years of Education: N/A   Occupational History  . Not on file.   Social History Main Topics  . Smoking status: Not on file  . Smokeless tobacco: Not on file  . Alcohol Use: Not on file  . Drug Use: Not on file  . Sexually Active: Not on file   Other Topics Concern  . Not on file   Social History Narrative  . No narrative on file    Allergies  Allergen Reactions  . Diltiazem Hcl   . Sulfonamide Derivatives     Current Outpatient Prescriptions  Medication Sig Dispense Refill  . aspirin 81 MG tablet Take 81 mg by mouth daily.        . Cyanocobalamin (VITAMIN B-12 CR) 1500 MCG TBCR Take by mouth as  directed.        . ezetimibe (ZETIA) 10 MG tablet Take 10 mg by mouth daily.        . ferrous sulfate 325 (65 FE) MG tablet Take 325 mg by mouth daily with breakfast.        . fish oil-omega-3 fatty acids 1000 MG capsule Take 2 g by mouth daily.        Marland Kitchen lisinopril (PRINIVIL,ZESTRIL) 20 MG tablet Take 20 mg by mouth daily.        . metoprolol succinate (TOPROL-XL) 25 MG 24 hr tablet Take 12.5 mg by mouth daily.        . montelukast (SINGULAIR) 10 MG tablet Take 10 mg by mouth at bedtime.        . Multiple Vitamin (MULTIVITAMIN PO) Take by mouth daily.        Marland Kitchen PANTOPRAZOLE SODIUM PO Take by mouth 2 (two) times daily.        . pravastatin (PRAVACHOL) 20 MG tablet Take 20 mg by mouth daily.          ROS Negative other than HPI.   PE General Appearance: well developed, well nourished in no acute distress HEENT: symmetrical face, PERRLA, good dentition  Neck: no JVD, thyromegaly,  or adenopathy, trachea midline Chest: symmetric without deformity Cardiac: PMI non-displaced, RRR, normal S1, S2, no gallop or murmur Lung: clear to ausculation and percussion Vascular: all pulses full without bruits  Abdominal: nondistended, nontender, good bowel sounds, no HSM, no bruits Extremities: no cyanosis, clubbing or edema, no sign of DVT, no varicosities  Skin: normal color, no rashes Neuro: alert and oriented x 3, non-focal Pysch: normal affect Filed Vitals:   07/09/10 0900 07/09/10 0922 07/09/10 0923 07/09/10 0924  BP: 144/70 121/68 117/68 130/71  Pulse: 71 61 62 66  Height: 5\' 1"  (1.549 m)     Weight: 183 lb (83.008 kg)       EKG  Labs and Studies Reviewed.   Lab Results  Component Value Date   WBC 7.2 06/20/2010   HGB 11.8* 06/20/2010   HCT 34.8* 06/20/2010   MCV 94.3 06/20/2010   PLT 153 06/20/2010      Chemistry      Component Value Date/Time   NA 138 06/20/2010 1327   K 5.3* 06/20/2010 1327   CL 106 06/20/2010 1327   CO2 22 06/20/2010 1327   BUN 35* 06/20/2010 1327   CREATININE  1.77* 06/20/2010 1327      Component Value Date/Time   CALCIUM 9.8 06/20/2010 1327   ALKPHOS 106 06/20/2010 1327   AST 22 06/20/2010 1327   ALT 28 06/20/2010 1327   BILITOT 0.2* 06/20/2010 1327       Lab Results  Component Value Date   CHOL  Value: 118        ATP III CLASSIFICATION:  <200     mg/dL   Desirable  200-239  mg/dL   Borderline High  >=240    mg/dL   High        06/07/2008   Lab Results  Component Value Date   HDL 35* 06/07/2008   Lab Results  Component Value Date   LDLCALC  Value: 66        Total Cholesterol/HDL:CHD Risk Coronary Heart Disease Risk Table                     Men   Women  1/2 Average Risk   3.4   3.3  Average Risk       5.0   4.4  2 X Average Risk   9.6   7.1  3 X Average Risk  23.4   11.0        Use the calculated Patient Ratio above and the CHD Risk Table to determine the patient's CHD Risk.        ATP III CLASSIFICATION (LDL):  <100     mg/dL   Optimal  100-129  mg/dL   Near or Above                    Optimal  130-159  mg/dL   Borderline  160-189  mg/dL   High  >190     mg/dL   Very High 06/07/2008   Lab Results  Component Value Date   TRIG 85 06/07/2008   Lab Results  Component Value Date   CHOLHDL 3.4 06/07/2008   Lab Results  Component Value Date   HGBA1C  Value: 6.1 (NOTE) The ADA recommends the following therapeutic goal for glycemic control related to Hgb A1c measurement: Goal of therapy: <6.5 Hgb A1c  Reference: American Diabetes Association: Clinical Practice Recommendations 2010, Diabetes Care, 2010, 33: (Suppl  1). 06/07/2008   Lab Results  Component Value Date   ALT 28 06/20/2010   AST 22 06/20/2010   ALKPHOS 106 06/20/2010   BILITOT 0.2* 06/20/2010   Lab Results  Component Value Date   TSH 0.234 **Test methodology is 3rd generation TSH** 06/07/2008

## 2010-09-29 ENCOUNTER — Other Ambulatory Visit: Payer: Self-pay | Admitting: Gastroenterology

## 2010-09-29 DIAGNOSIS — K5732 Diverticulitis of large intestine without perforation or abscess without bleeding: Secondary | ICD-10-CM

## 2010-10-02 ENCOUNTER — Ambulatory Visit
Admission: RE | Admit: 2010-10-02 | Discharge: 2010-10-02 | Disposition: A | Discharge status: Home / Self Care | Payer: Medicare Other | Source: Ambulatory Visit | Attending: Gastroenterology | Admitting: Gastroenterology

## 2010-10-02 ENCOUNTER — Other Ambulatory Visit: Payer: Self-pay | Admitting: Gastroenterology

## 2010-10-02 DIAGNOSIS — K5732 Diverticulitis of large intestine without perforation or abscess without bleeding: Secondary | ICD-10-CM

## 2010-11-02 LAB — CARDIAC PANEL(CRET KIN+CKTOT+MB+TROPI)
CK, MB: 1.1
CK, MB: 1.1
Relative Index: INVALID
Total CK: 109
Total CK: 80
Troponin I: 0.01

## 2010-11-02 LAB — CBC
HCT: 33.1 — ABNORMAL LOW
Hemoglobin: 11.2 — ABNORMAL LOW
MCV: 93.3
Platelets: 204
RBC: 3.54 — ABNORMAL LOW
WBC: 6.2
WBC: 7

## 2010-11-02 LAB — POCT I-STAT, CHEM 8
BUN: 27 — ABNORMAL HIGH
Calcium, Ion: 1.16
Creatinine, Ser: 1.5 — ABNORMAL HIGH
TCO2: 25

## 2010-11-02 LAB — PROTIME-INR: Prothrombin Time: 13.4

## 2010-11-02 LAB — COMPREHENSIVE METABOLIC PANEL
ALT: 14
AST: 23
CO2: 26
Calcium: 9.7
Chloride: 105
GFR calc Af Amer: 50 — ABNORMAL LOW
GFR calc non Af Amer: 42 — ABNORMAL LOW
Potassium: 4.3
Sodium: 139

## 2010-11-02 LAB — TSH: TSH: 1.284

## 2010-11-02 LAB — IRON AND TIBC
Iron: 81
Saturation Ratios: 27
TIBC: 301
UIBC: 220

## 2010-11-02 LAB — BASIC METABOLIC PANEL
BUN: 18
Calcium: 10.4
Chloride: 106
Creatinine, Ser: 1.15
GFR calc Af Amer: 55 — ABNORMAL LOW
GFR calc Af Amer: 60 — ABNORMAL LOW
GFR calc non Af Amer: 46 — ABNORMAL LOW
Potassium: 3.9
Sodium: 140

## 2010-11-02 LAB — DIFFERENTIAL
Eosinophils Absolute: 0.2
Eosinophils Relative: 3
Lymphs Abs: 2.1

## 2010-11-02 LAB — VITAMIN B12: Vitamin B-12: 301 (ref 211–911)

## 2010-11-02 LAB — OCCULT BLOOD X 1 CARD TO LAB, STOOL: Fecal Occult Bld: NEGATIVE

## 2010-11-03 LAB — APTT: aPTT: 26

## 2010-11-03 LAB — CBC
HCT: 36.6
MCHC: 34.3
Platelets: 249
RDW: 12.8

## 2010-11-03 LAB — URINALYSIS, ROUTINE W REFLEX MICROSCOPIC
Glucose, UA: NEGATIVE
Nitrite: NEGATIVE
Specific Gravity, Urine: 1.021
pH: 5.5

## 2010-11-03 LAB — COMPREHENSIVE METABOLIC PANEL
Albumin: 4.4
Alkaline Phosphatase: 87
BUN: 30 — ABNORMAL HIGH
Calcium: 9.9
Potassium: 4.1
Sodium: 141
Total Protein: 7.3

## 2010-11-03 LAB — DIFFERENTIAL
Basophils Relative: 0
Lymphocytes Relative: 33
Lymphs Abs: 3.4
Monocytes Absolute: 0.5
Monocytes Relative: 5
Neutro Abs: 6

## 2010-11-03 LAB — URINE MICROSCOPIC-ADD ON

## 2010-11-05 LAB — BASIC METABOLIC PANEL
BUN: 19
BUN: 29 — ABNORMAL HIGH
Calcium: 8.9
Calcium: 9
Chloride: 103
Creatinine, Ser: 1.03
GFR calc Af Amer: >60
GFR calc non Af Amer: 52 — ABNORMAL LOW
Glucose, Bld: 135 — ABNORMAL HIGH
Potassium: 4.1
Potassium: 4.4
Sodium: 136

## 2010-11-05 LAB — CBC
HCT: 29.2 — ABNORMAL LOW
Hemoglobin: 10 — ABNORMAL LOW
Hemoglobin: 10.1 — ABNORMAL LOW
MCHC: 34.3
MCHC: 34.5
MCV: 93.2
Platelets: 167
RDW: 13.7
RDW: 13.9

## 2011-03-15 DIAGNOSIS — D509 Iron deficiency anemia, unspecified: Secondary | ICD-10-CM | POA: Diagnosis not present

## 2011-03-15 DIAGNOSIS — N390 Urinary tract infection, site not specified: Secondary | ICD-10-CM | POA: Diagnosis not present

## 2011-03-15 DIAGNOSIS — I10 Essential (primary) hypertension: Secondary | ICD-10-CM | POA: Diagnosis not present

## 2011-03-15 DIAGNOSIS — E782 Mixed hyperlipidemia: Secondary | ICD-10-CM | POA: Diagnosis not present

## 2011-03-15 DIAGNOSIS — Z79899 Other long term (current) drug therapy: Secondary | ICD-10-CM | POA: Diagnosis not present

## 2011-03-15 DIAGNOSIS — R7301 Impaired fasting glucose: Secondary | ICD-10-CM | POA: Diagnosis not present

## 2011-03-15 DIAGNOSIS — R5381 Other malaise: Secondary | ICD-10-CM | POA: Diagnosis not present

## 2011-03-15 DIAGNOSIS — M109 Gout, unspecified: Secondary | ICD-10-CM | POA: Diagnosis not present

## 2011-03-15 DIAGNOSIS — R5383 Other fatigue: Secondary | ICD-10-CM | POA: Diagnosis not present

## 2011-04-26 DIAGNOSIS — M2550 Pain in unspecified joint: Secondary | ICD-10-CM | POA: Diagnosis not present

## 2011-04-26 DIAGNOSIS — M109 Gout, unspecified: Secondary | ICD-10-CM | POA: Diagnosis not present

## 2011-04-26 DIAGNOSIS — M1990 Unspecified osteoarthritis, unspecified site: Secondary | ICD-10-CM | POA: Diagnosis not present

## 2011-04-26 DIAGNOSIS — Z79899 Other long term (current) drug therapy: Secondary | ICD-10-CM | POA: Diagnosis not present

## 2011-04-26 DIAGNOSIS — M545 Low back pain, unspecified: Secondary | ICD-10-CM | POA: Diagnosis not present

## 2011-04-30 ENCOUNTER — Other Ambulatory Visit: Payer: Self-pay | Admitting: Family Medicine

## 2011-04-30 DIAGNOSIS — Z1231 Encounter for screening mammogram for malignant neoplasm of breast: Secondary | ICD-10-CM

## 2011-05-12 ENCOUNTER — Ambulatory Visit
Admission: RE | Admit: 2011-05-12 | Discharge: 2011-05-12 | Disposition: A | Discharge status: Home / Self Care | Payer: Medicare Other | Source: Ambulatory Visit | Attending: Family Medicine | Admitting: Family Medicine

## 2011-05-12 DIAGNOSIS — Z1231 Encounter for screening mammogram for malignant neoplasm of breast: Secondary | ICD-10-CM | POA: Diagnosis not present

## 2011-07-27 DIAGNOSIS — N183 Chronic kidney disease, stage 3 unspecified: Secondary | ICD-10-CM | POA: Diagnosis not present

## 2011-09-20 DIAGNOSIS — I10 Essential (primary) hypertension: Secondary | ICD-10-CM | POA: Diagnosis not present

## 2011-09-20 DIAGNOSIS — R7301 Impaired fasting glucose: Secondary | ICD-10-CM | POA: Diagnosis not present

## 2011-09-20 DIAGNOSIS — R1084 Generalized abdominal pain: Secondary | ICD-10-CM | POA: Diagnosis not present

## 2011-09-20 DIAGNOSIS — K5732 Diverticulitis of large intestine without perforation or abscess without bleeding: Secondary | ICD-10-CM | POA: Diagnosis not present

## 2011-09-20 DIAGNOSIS — D509 Iron deficiency anemia, unspecified: Secondary | ICD-10-CM | POA: Diagnosis not present

## 2011-09-20 DIAGNOSIS — R110 Nausea: Secondary | ICD-10-CM | POA: Diagnosis not present

## 2011-09-20 DIAGNOSIS — E782 Mixed hyperlipidemia: Secondary | ICD-10-CM | POA: Diagnosis not present

## 2011-10-18 DIAGNOSIS — E860 Dehydration: Secondary | ICD-10-CM | POA: Diagnosis not present

## 2011-10-25 DIAGNOSIS — M109 Gout, unspecified: Secondary | ICD-10-CM | POA: Diagnosis not present

## 2011-10-25 DIAGNOSIS — Z79899 Other long term (current) drug therapy: Secondary | ICD-10-CM | POA: Diagnosis not present

## 2011-10-25 DIAGNOSIS — M2550 Pain in unspecified joint: Secondary | ICD-10-CM | POA: Diagnosis not present

## 2011-10-25 DIAGNOSIS — M1990 Unspecified osteoarthritis, unspecified site: Secondary | ICD-10-CM | POA: Diagnosis not present

## 2011-11-22 DIAGNOSIS — M109 Gout, unspecified: Secondary | ICD-10-CM | POA: Diagnosis not present

## 2011-12-08 DIAGNOSIS — Z23 Encounter for immunization: Secondary | ICD-10-CM | POA: Diagnosis not present

## 2011-12-23 DIAGNOSIS — R7301 Impaired fasting glucose: Secondary | ICD-10-CM | POA: Diagnosis not present

## 2011-12-23 DIAGNOSIS — E782 Mixed hyperlipidemia: Secondary | ICD-10-CM | POA: Diagnosis not present

## 2011-12-23 DIAGNOSIS — F411 Generalized anxiety disorder: Secondary | ICD-10-CM | POA: Diagnosis not present

## 2011-12-23 DIAGNOSIS — M109 Gout, unspecified: Secondary | ICD-10-CM | POA: Diagnosis not present

## 2011-12-23 DIAGNOSIS — I10 Essential (primary) hypertension: Secondary | ICD-10-CM | POA: Diagnosis not present

## 2011-12-23 DIAGNOSIS — D509 Iron deficiency anemia, unspecified: Secondary | ICD-10-CM | POA: Diagnosis not present

## 2011-12-23 DIAGNOSIS — R1084 Generalized abdominal pain: Secondary | ICD-10-CM | POA: Diagnosis not present

## 2011-12-23 DIAGNOSIS — K219 Gastro-esophageal reflux disease without esophagitis: Secondary | ICD-10-CM | POA: Diagnosis not present

## 2011-12-23 DIAGNOSIS — J309 Allergic rhinitis, unspecified: Secondary | ICD-10-CM | POA: Diagnosis not present

## 2011-12-23 DIAGNOSIS — R110 Nausea: Secondary | ICD-10-CM | POA: Diagnosis not present

## 2012-01-11 ENCOUNTER — Other Ambulatory Visit: Payer: Self-pay | Admitting: Dermatology

## 2012-01-11 DIAGNOSIS — L821 Other seborrheic keratosis: Secondary | ICD-10-CM | POA: Diagnosis not present

## 2012-01-11 DIAGNOSIS — C44319 Basal cell carcinoma of skin of other parts of face: Secondary | ICD-10-CM | POA: Diagnosis not present

## 2012-01-11 DIAGNOSIS — L570 Actinic keratosis: Secondary | ICD-10-CM | POA: Diagnosis not present

## 2012-02-09 DIAGNOSIS — C4490 Unspecified malignant neoplasm of skin, unspecified: Secondary | ICD-10-CM

## 2012-02-09 HISTORY — DX: Unspecified malignant neoplasm of skin, unspecified: C44.90

## 2012-02-10 DIAGNOSIS — D649 Anemia, unspecified: Secondary | ICD-10-CM | POA: Diagnosis not present

## 2012-02-10 DIAGNOSIS — Z79899 Other long term (current) drug therapy: Secondary | ICD-10-CM | POA: Diagnosis not present

## 2012-02-18 DIAGNOSIS — M5412 Radiculopathy, cervical region: Secondary | ICD-10-CM | POA: Diagnosis not present

## 2012-02-18 DIAGNOSIS — M25519 Pain in unspecified shoulder: Secondary | ICD-10-CM | POA: Diagnosis not present

## 2012-02-18 DIAGNOSIS — R350 Frequency of micturition: Secondary | ICD-10-CM | POA: Diagnosis not present

## 2012-02-18 DIAGNOSIS — D649 Anemia, unspecified: Secondary | ICD-10-CM | POA: Diagnosis not present

## 2012-02-18 DIAGNOSIS — J069 Acute upper respiratory infection, unspecified: Secondary | ICD-10-CM | POA: Diagnosis not present

## 2012-02-22 DIAGNOSIS — Z85828 Personal history of other malignant neoplasm of skin: Secondary | ICD-10-CM | POA: Diagnosis not present

## 2012-02-22 DIAGNOSIS — L219 Seborrheic dermatitis, unspecified: Secondary | ICD-10-CM | POA: Diagnosis not present

## 2012-02-23 DIAGNOSIS — D649 Anemia, unspecified: Secondary | ICD-10-CM | POA: Diagnosis not present

## 2012-02-25 DIAGNOSIS — R059 Cough, unspecified: Secondary | ICD-10-CM | POA: Diagnosis not present

## 2012-02-25 DIAGNOSIS — S40019A Contusion of unspecified shoulder, initial encounter: Secondary | ICD-10-CM | POA: Diagnosis not present

## 2012-02-25 DIAGNOSIS — R05 Cough: Secondary | ICD-10-CM | POA: Diagnosis not present

## 2012-02-25 DIAGNOSIS — J209 Acute bronchitis, unspecified: Secondary | ICD-10-CM | POA: Diagnosis not present

## 2012-02-25 DIAGNOSIS — S139XXA Sprain of joints and ligaments of unspecified parts of neck, initial encounter: Secondary | ICD-10-CM | POA: Diagnosis not present

## 2012-02-28 ENCOUNTER — Telehealth: Payer: Self-pay | Admitting: Oncology

## 2012-02-28 NOTE — Telephone Encounter (Signed)
S/W pt in re NP appt 02/11 @ 2:30 w/Dr. Ralene Ok Referring Dr. Anson Fret Dx-Anemia  Welcome packet mailed.

## 2012-02-29 ENCOUNTER — Telehealth: Payer: Self-pay | Admitting: Oncology

## 2012-02-29 NOTE — Telephone Encounter (Signed)
C/D 02/29/12 for appt. 03/21/12

## 2012-03-01 ENCOUNTER — Ambulatory Visit: Payer: Medicare Other | Admitting: Cardiology

## 2012-03-01 DIAGNOSIS — D649 Anemia, unspecified: Secondary | ICD-10-CM | POA: Diagnosis not present

## 2012-03-06 DIAGNOSIS — J309 Allergic rhinitis, unspecified: Secondary | ICD-10-CM | POA: Diagnosis not present

## 2012-03-06 DIAGNOSIS — J45909 Unspecified asthma, uncomplicated: Secondary | ICD-10-CM | POA: Diagnosis not present

## 2012-03-08 ENCOUNTER — Ambulatory Visit: Payer: Medicare Other | Admitting: Cardiology

## 2012-03-13 ENCOUNTER — Other Ambulatory Visit: Payer: Self-pay | Admitting: Gastroenterology

## 2012-03-13 DIAGNOSIS — D649 Anemia, unspecified: Secondary | ICD-10-CM | POA: Diagnosis not present

## 2012-03-13 DIAGNOSIS — R195 Other fecal abnormalities: Secondary | ICD-10-CM | POA: Diagnosis not present

## 2012-03-16 DIAGNOSIS — R079 Chest pain, unspecified: Secondary | ICD-10-CM | POA: Diagnosis not present

## 2012-03-16 DIAGNOSIS — M545 Low back pain, unspecified: Secondary | ICD-10-CM | POA: Diagnosis not present

## 2012-03-17 ENCOUNTER — Ambulatory Visit
Admission: RE | Admit: 2012-03-17 | Discharge: 2012-03-17 | Disposition: A | Discharge status: Home / Self Care | Payer: Medicare Other | Source: Ambulatory Visit | Attending: Family Medicine | Admitting: Family Medicine

## 2012-03-17 ENCOUNTER — Other Ambulatory Visit: Payer: Self-pay | Admitting: Family Medicine

## 2012-03-17 DIAGNOSIS — R0789 Other chest pain: Secondary | ICD-10-CM

## 2012-03-17 DIAGNOSIS — R0781 Pleurodynia: Secondary | ICD-10-CM

## 2012-03-17 DIAGNOSIS — M549 Dorsalgia, unspecified: Secondary | ICD-10-CM

## 2012-03-17 DIAGNOSIS — R079 Chest pain, unspecified: Secondary | ICD-10-CM | POA: Diagnosis not present

## 2012-03-17 DIAGNOSIS — J984 Other disorders of lung: Secondary | ICD-10-CM | POA: Diagnosis not present

## 2012-03-17 DIAGNOSIS — M47814 Spondylosis without myelopathy or radiculopathy, thoracic region: Secondary | ICD-10-CM | POA: Diagnosis not present

## 2012-03-19 ENCOUNTER — Other Ambulatory Visit: Payer: Self-pay | Admitting: Oncology

## 2012-03-19 DIAGNOSIS — D649 Anemia, unspecified: Secondary | ICD-10-CM | POA: Insufficient documentation

## 2012-03-19 DIAGNOSIS — D539 Nutritional anemia, unspecified: Secondary | ICD-10-CM | POA: Insufficient documentation

## 2012-03-21 ENCOUNTER — Ambulatory Visit: Payer: Medicare Other

## 2012-03-21 ENCOUNTER — Telehealth: Payer: Self-pay | Admitting: Oncology

## 2012-03-21 ENCOUNTER — Ambulatory Visit: Payer: Medicare Other | Admitting: Oncology

## 2012-03-21 ENCOUNTER — Other Ambulatory Visit: Payer: Medicare Other | Admitting: Lab

## 2012-03-21 DIAGNOSIS — K319 Disease of stomach and duodenum, unspecified: Secondary | ICD-10-CM | POA: Diagnosis not present

## 2012-03-21 DIAGNOSIS — D509 Iron deficiency anemia, unspecified: Secondary | ICD-10-CM | POA: Diagnosis not present

## 2012-03-21 NOTE — Telephone Encounter (Signed)
per pt phone call primary Dr. told her to wait on this appt and call back after nxt scan to r/s

## 2012-03-23 ENCOUNTER — Other Ambulatory Visit: Payer: Medicare Other

## 2012-03-28 DIAGNOSIS — H2510 Age-related nuclear cataract, unspecified eye: Secondary | ICD-10-CM | POA: Diagnosis not present

## 2012-03-28 DIAGNOSIS — H524 Presbyopia: Secondary | ICD-10-CM | POA: Diagnosis not present

## 2012-03-28 DIAGNOSIS — H52229 Regular astigmatism, unspecified eye: Secondary | ICD-10-CM | POA: Diagnosis not present

## 2012-03-28 DIAGNOSIS — H5200 Hypermetropia, unspecified eye: Secondary | ICD-10-CM | POA: Diagnosis not present

## 2012-03-31 ENCOUNTER — Other Ambulatory Visit: Payer: Medicare Other

## 2012-04-03 DIAGNOSIS — J45909 Unspecified asthma, uncomplicated: Secondary | ICD-10-CM | POA: Diagnosis not present

## 2012-04-07 ENCOUNTER — Other Ambulatory Visit: Payer: Self-pay | Admitting: Gastroenterology

## 2012-04-07 ENCOUNTER — Ambulatory Visit
Admission: RE | Admit: 2012-04-07 | Discharge: 2012-04-07 | Disposition: A | Discharge status: Home / Self Care | Payer: Medicare Other | Source: Ambulatory Visit | Attending: Gastroenterology | Admitting: Gastroenterology

## 2012-04-07 DIAGNOSIS — K5730 Diverticulosis of large intestine without perforation or abscess without bleeding: Secondary | ICD-10-CM | POA: Diagnosis not present

## 2012-04-07 DIAGNOSIS — D649 Anemia, unspecified: Secondary | ICD-10-CM

## 2012-04-24 DIAGNOSIS — M545 Low back pain, unspecified: Secondary | ICD-10-CM | POA: Diagnosis not present

## 2012-04-24 DIAGNOSIS — M6281 Muscle weakness (generalized): Secondary | ICD-10-CM | POA: Diagnosis not present

## 2012-04-24 DIAGNOSIS — M109 Gout, unspecified: Secondary | ICD-10-CM | POA: Diagnosis not present

## 2012-04-24 DIAGNOSIS — Z79899 Other long term (current) drug therapy: Secondary | ICD-10-CM | POA: Diagnosis not present

## 2012-04-24 DIAGNOSIS — M1990 Unspecified osteoarthritis, unspecified site: Secondary | ICD-10-CM | POA: Diagnosis not present

## 2012-05-02 DIAGNOSIS — Z79899 Other long term (current) drug therapy: Secondary | ICD-10-CM | POA: Diagnosis not present

## 2012-05-15 DIAGNOSIS — N183 Chronic kidney disease, stage 3 unspecified: Secondary | ICD-10-CM | POA: Diagnosis not present

## 2012-05-15 DIAGNOSIS — N2581 Secondary hyperparathyroidism of renal origin: Secondary | ICD-10-CM | POA: Diagnosis not present

## 2012-05-16 ENCOUNTER — Ambulatory Visit (INDEPENDENT_AMBULATORY_CARE_PROVIDER_SITE_OTHER): Payer: Medicare Other | Admitting: Cardiology

## 2012-05-16 ENCOUNTER — Encounter: Payer: Self-pay | Admitting: Cardiology

## 2012-05-16 VITALS — BP 132/78 | HR 55 | Ht 61.0 in | Wt 180.0 lb

## 2012-05-16 DIAGNOSIS — I10 Essential (primary) hypertension: Secondary | ICD-10-CM

## 2012-05-16 DIAGNOSIS — I4891 Unspecified atrial fibrillation: Secondary | ICD-10-CM | POA: Diagnosis not present

## 2012-05-16 DIAGNOSIS — I480 Paroxysmal atrial fibrillation: Secondary | ICD-10-CM

## 2012-05-16 MED ORDER — AMLODIPINE BESYLATE 5 MG PO TABS: 5.0000 mg | ORAL_TABLET | Freq: Every day | ORAL | Status: DC

## 2012-05-16 NOTE — Patient Instructions (Addendum)
STOP Lisinopril  START Amlodipine 5mg  daily.  Check your blood pressure daily about 2 hours after your medication to ensure its effectiveness.   Your blood pressure goal is less than 150/90.  Your physician wants you to follow-up in: 1 year with Dr. Verl Blalock.  You will receive a reminder letter in the mail two months in advance. If you don't receive a letter, please call our office to schedule the follow-up appointment.

## 2012-05-16 NOTE — Assessment & Plan Note (Signed)
She has not had a clinical recurrence. She became very bradycardic on diltiazem in the hospital and most likely will not tolerate any AV node blocking drugs. I stopped her lisinopril and start her on file milligrams of amlodipine. Blood pressure goal is less than 150 over less than 90.  She is not a candidate for anticoagulation with falls. Again she has not had recurrence of PAF in some time. Continue low-dose aspirin. Followup with [place] in a year.

## 2012-05-16 NOTE — Progress Notes (Signed)
HPI [name] returns today for evaluation and management of her history of paroxysmal A. fib. I last saw her about a year or so ago. She's had no recurrent clinical events. She has fallen once over new years.  She has a history of significant bradycardia with beta blockers and diltiazem particularly. Her heart rate today is 55. She saw Dr. Mercy Moore of nephrology yesterday and was told that she had chronic kidney disease and that her potassium was elevated. She was advised to stop her lisinopril but she wanted to talk with me. I do not have those office notes for labs. Her last creatinine we have was 1.7. Potassium at that time was normal.  Past Medical History  Diagnosis Date  . Atrial fibrillation   . Palpitations   . Unspecified essential hypertension   . Personal history of other diseases of digestive system   . Other abnormal glucose   . Odynophagia     Current Outpatient Prescriptions  Medication Sig Dispense Refill  . aspirin 81 MG tablet Take 81 mg by mouth daily.        . Cholecalciferol (VITAMIN D3) 1000 UNITS CAPS Take 2,000 Units by mouth.      . Coenzyme Q10 (CO Q 10 PO) Take by mouth daily.      . ferrous sulfate 325 (65 FE) MG tablet Take 325 mg by mouth daily with breakfast.        . fish oil-omega-3 fatty acids 1000 MG capsule Take 2 g by mouth daily.        Marland Kitchen HYDROcodone-acetaminophen (NORCO/VICODIN) 5-325 MG per tablet as needed.      . montelukast (SINGULAIR) 10 MG tablet Take 10 mg by mouth at bedtime.        . Multiple Vitamin (MULTIVITAMIN PO) Take by mouth daily.        Marland Kitchen PANTOPRAZOLE SODIUM PO Take by mouth 2 (two) times daily.        . pravastatin (PRAVACHOL) 20 MG tablet Take 20 mg by mouth daily.        . sertraline (ZOLOFT) 50 MG tablet Take 1 tablet by mouth daily.      Marland Kitchen ULORIC 40 MG tablet Take 1 tablet by mouth daily.      . VENTOLIN HFA 108 (90 BASE) MCG/ACT inhaler as needed.      Marland Kitchen FLOVENT HFA 110 MCG/ACT inhaler as needed.       No current  facility-administered medications for this visit.    Allergies  Allergen Reactions  . Diltiazem Hcl   . Sulfonamide Derivatives     Family History  Problem Relation Age of Onset  . Heart disease Mother   . Diabetes Father     History   Social History  . Marital Status: Widowed    Spouse Name: N/A    Number of Children: N/A  . Years of Education: N/A   Occupational History  . Not on file.   Social History Main Topics  . Smoking status: Never Smoker   . Smokeless tobacco: Never Used  . Alcohol Use: No  . Drug Use: No  . Sexually Active: Not on file   Other Topics Concern  . Not on file   Social History Narrative  . No narrative on file    ROS ALL NEGATIVE EXCEPT THOSE NOTED IN HPI  PE  General Appearance: well developed, well nourished in no acute distress HEENT: symmetrical face, PERRLA, good dentition  Neck: no JVD, thyromegaly, or adenopathy, trachea midline  Chest: symmetric without deformity Cardiac: PMI non-displaced, RRR, normal S1, S2, no gallop or murmur Lung: clear to ausculation and percussion Vascular: all pulses full without bruits  Abdominal: nondistended, nontender, good bowel sounds, no HSM, no bruits Extremities: no cyanosis, clubbing or edema, no sign of DVT, no varicosities  Skin: normal color, no rashes Neuro: alert and oriented x 3, non-focal Pysch: normal affect  EKG Sinus bradycardia, left anterior fascicular block, no change.  BMET    Component Value Date/Time   NA 138 06/20/2010 1327   K 5.3* 06/20/2010 1327   CL 106 06/20/2010 1327   CO2 22 06/20/2010 1327   GLUCOSE 134* 06/20/2010 1327   BUN 35* 06/20/2010 1327   CREATININE 1.77* 06/20/2010 1327   CALCIUM 9.8 06/20/2010 1327   GFRNONAA 28* 06/20/2010 1327   GFRAA  Value: 33        The eGFR has been calculated using the MDRD equation. This calculation has not been validated in all clinical situations. eGFR's persistently <60 mL/min signify possible Chronic Kidney Disease.*  06/20/2010 1327    Lipid Panel     Component Value Date/Time   CHOL  Value: 118        ATP III CLASSIFICATION:  <200     mg/dL   Desirable  200-239  mg/dL   Borderline High  >=240    mg/dL   High        06/07/2008 0405   TRIG 85 06/07/2008 0405   HDL 35* 06/07/2008 0405   CHOLHDL 3.4 06/07/2008 0405   VLDL 17 06/07/2008 0405   LDLCALC  Value: 66        Total Cholesterol/HDL:CHD Risk Coronary Heart Disease Risk Table                     Men   Women  1/2 Average Risk   3.4   3.3  Average Risk       5.0   4.4  2 X Average Risk   9.6   7.1  3 X Average Risk  23.4   11.0        Use the calculated Patient Ratio above and the CHD Risk Table to determine the patient's CHD Risk.        ATP III CLASSIFICATION (LDL):  <100     mg/dL   Optimal  100-129  mg/dL   Near or Above                    Optimal  130-159  mg/dL   Borderline  160-189  mg/dL   High  >190     mg/dL   Very High 06/07/2008 0405    CBC    Component Value Date/Time   WBC 7.2 06/20/2010 1327   RBC 3.69* 06/20/2010 1327   HGB 11.8* 06/20/2010 1327   HCT 34.8* 06/20/2010 1327   PLT 153 06/20/2010 1327   MCV 94.3 06/20/2010 1327   MCH 32.0 06/20/2010 1327   MCHC 33.9 06/20/2010 1327   RDW 12.9 06/20/2010 1327   LYMPHSABS 2.4 06/20/2010 1327   MONOABS 0.6 06/20/2010 1327   EOSABS 0.1 06/20/2010 1327   BASOSABS 0.0 06/20/2010 1327

## 2012-05-16 NOTE — Assessment & Plan Note (Signed)
Change lisinopril to amlodipine as noted.

## 2012-05-23 DIAGNOSIS — Z85828 Personal history of other malignant neoplasm of skin: Secondary | ICD-10-CM | POA: Diagnosis not present

## 2012-05-23 DIAGNOSIS — L578 Other skin changes due to chronic exposure to nonionizing radiation: Secondary | ICD-10-CM | POA: Diagnosis not present

## 2012-05-23 DIAGNOSIS — L570 Actinic keratosis: Secondary | ICD-10-CM | POA: Diagnosis not present

## 2012-05-25 DIAGNOSIS — K5900 Constipation, unspecified: Secondary | ICD-10-CM | POA: Diagnosis not present

## 2012-05-25 DIAGNOSIS — R82998 Other abnormal findings in urine: Secondary | ICD-10-CM | POA: Diagnosis not present

## 2012-05-25 DIAGNOSIS — R109 Unspecified abdominal pain: Secondary | ICD-10-CM | POA: Diagnosis not present

## 2012-05-29 DIAGNOSIS — E875 Hyperkalemia: Secondary | ICD-10-CM | POA: Diagnosis not present

## 2012-06-13 DIAGNOSIS — M545 Low back pain, unspecified: Secondary | ICD-10-CM | POA: Diagnosis not present

## 2012-06-13 DIAGNOSIS — M48061 Spinal stenosis, lumbar region without neurogenic claudication: Secondary | ICD-10-CM | POA: Diagnosis not present

## 2012-06-13 DIAGNOSIS — IMO0002 Reserved for concepts with insufficient information to code with codable children: Secondary | ICD-10-CM | POA: Diagnosis not present

## 2012-06-22 DIAGNOSIS — Z79899 Other long term (current) drug therapy: Secondary | ICD-10-CM | POA: Diagnosis not present

## 2012-06-22 DIAGNOSIS — M545 Low back pain, unspecified: Secondary | ICD-10-CM | POA: Diagnosis not present

## 2012-06-22 DIAGNOSIS — M109 Gout, unspecified: Secondary | ICD-10-CM | POA: Diagnosis not present

## 2012-06-22 DIAGNOSIS — M1990 Unspecified osteoarthritis, unspecified site: Secondary | ICD-10-CM | POA: Diagnosis not present

## 2012-06-22 DIAGNOSIS — M6281 Muscle weakness (generalized): Secondary | ICD-10-CM | POA: Diagnosis not present

## 2012-06-22 DIAGNOSIS — M2550 Pain in unspecified joint: Secondary | ICD-10-CM | POA: Diagnosis not present

## 2012-07-26 DIAGNOSIS — M48061 Spinal stenosis, lumbar region without neurogenic claudication: Secondary | ICD-10-CM | POA: Diagnosis not present

## 2012-07-27 ENCOUNTER — Other Ambulatory Visit: Payer: Self-pay | Admitting: Neurological Surgery

## 2012-07-27 DIAGNOSIS — M48061 Spinal stenosis, lumbar region without neurogenic claudication: Secondary | ICD-10-CM

## 2012-08-01 DIAGNOSIS — R42 Dizziness and giddiness: Secondary | ICD-10-CM | POA: Diagnosis not present

## 2012-08-03 DIAGNOSIS — R5381 Other malaise: Secondary | ICD-10-CM | POA: Diagnosis not present

## 2012-08-03 DIAGNOSIS — R5383 Other fatigue: Secondary | ICD-10-CM | POA: Diagnosis not present

## 2012-08-04 ENCOUNTER — Other Ambulatory Visit: Payer: Self-pay | Admitting: Neurological Surgery

## 2012-08-04 ENCOUNTER — Ambulatory Visit
Admission: RE | Admit: 2012-08-04 | Discharge: 2012-08-04 | Disposition: A | Discharge status: Home / Self Care | Payer: Medicare Other | Source: Ambulatory Visit | Attending: Neurological Surgery | Admitting: Neurological Surgery

## 2012-08-04 DIAGNOSIS — M5137 Other intervertebral disc degeneration, lumbosacral region: Secondary | ICD-10-CM | POA: Diagnosis not present

## 2012-08-04 DIAGNOSIS — M48061 Spinal stenosis, lumbar region without neurogenic claudication: Secondary | ICD-10-CM | POA: Diagnosis not present

## 2012-08-04 DIAGNOSIS — M5126 Other intervertebral disc displacement, lumbar region: Secondary | ICD-10-CM | POA: Diagnosis not present

## 2012-08-04 DIAGNOSIS — M47817 Spondylosis without myelopathy or radiculopathy, lumbosacral region: Secondary | ICD-10-CM | POA: Diagnosis not present

## 2012-08-21 DIAGNOSIS — J45909 Unspecified asthma, uncomplicated: Secondary | ICD-10-CM | POA: Diagnosis not present

## 2012-08-25 DIAGNOSIS — Z6829 Body mass index (BMI) 29.0-29.9, adult: Secondary | ICD-10-CM | POA: Diagnosis not present

## 2012-08-25 DIAGNOSIS — M48061 Spinal stenosis, lumbar region without neurogenic claudication: Secondary | ICD-10-CM | POA: Diagnosis not present

## 2012-09-08 DIAGNOSIS — M47817 Spondylosis without myelopathy or radiculopathy, lumbosacral region: Secondary | ICD-10-CM | POA: Diagnosis not present

## 2012-09-08 DIAGNOSIS — IMO0002 Reserved for concepts with insufficient information to code with codable children: Secondary | ICD-10-CM | POA: Diagnosis not present

## 2012-10-24 DIAGNOSIS — R350 Frequency of micturition: Secondary | ICD-10-CM | POA: Diagnosis not present

## 2012-10-24 DIAGNOSIS — R110 Nausea: Secondary | ICD-10-CM | POA: Diagnosis not present

## 2012-10-24 DIAGNOSIS — R1032 Left lower quadrant pain: Secondary | ICD-10-CM | POA: Diagnosis not present

## 2012-10-24 DIAGNOSIS — R42 Dizziness and giddiness: Secondary | ICD-10-CM | POA: Diagnosis not present

## 2012-11-10 DIAGNOSIS — J069 Acute upper respiratory infection, unspecified: Secondary | ICD-10-CM | POA: Diagnosis not present

## 2012-11-13 DIAGNOSIS — N183 Chronic kidney disease, stage 3 unspecified: Secondary | ICD-10-CM | POA: Diagnosis not present

## 2012-11-14 DIAGNOSIS — M1710 Unilateral primary osteoarthritis, unspecified knee: Secondary | ICD-10-CM | POA: Diagnosis not present

## 2012-11-14 DIAGNOSIS — M47817 Spondylosis without myelopathy or radiculopathy, lumbosacral region: Secondary | ICD-10-CM | POA: Diagnosis not present

## 2012-11-14 DIAGNOSIS — IMO0002 Reserved for concepts with insufficient information to code with codable children: Secondary | ICD-10-CM | POA: Diagnosis not present

## 2012-12-01 DIAGNOSIS — Z23 Encounter for immunization: Secondary | ICD-10-CM | POA: Diagnosis not present

## 2013-01-01 DIAGNOSIS — M1710 Unilateral primary osteoarthritis, unspecified knee: Secondary | ICD-10-CM | POA: Diagnosis not present

## 2013-01-01 DIAGNOSIS — IMO0002 Reserved for concepts with insufficient information to code with codable children: Secondary | ICD-10-CM | POA: Diagnosis not present

## 2013-01-08 DIAGNOSIS — M1710 Unilateral primary osteoarthritis, unspecified knee: Secondary | ICD-10-CM | POA: Diagnosis not present

## 2013-01-08 DIAGNOSIS — IMO0002 Reserved for concepts with insufficient information to code with codable children: Secondary | ICD-10-CM | POA: Diagnosis not present

## 2013-01-15 DIAGNOSIS — IMO0002 Reserved for concepts with insufficient information to code with codable children: Secondary | ICD-10-CM | POA: Diagnosis not present

## 2013-01-15 DIAGNOSIS — M1710 Unilateral primary osteoarthritis, unspecified knee: Secondary | ICD-10-CM | POA: Diagnosis not present

## 2013-02-19 DIAGNOSIS — M109 Gout, unspecified: Secondary | ICD-10-CM | POA: Diagnosis not present

## 2013-02-19 DIAGNOSIS — M2550 Pain in unspecified joint: Secondary | ICD-10-CM | POA: Diagnosis not present

## 2013-02-19 DIAGNOSIS — M1990 Unspecified osteoarthritis, unspecified site: Secondary | ICD-10-CM | POA: Diagnosis not present

## 2013-02-19 DIAGNOSIS — Z79899 Other long term (current) drug therapy: Secondary | ICD-10-CM | POA: Diagnosis not present

## 2013-02-26 DIAGNOSIS — M1710 Unilateral primary osteoarthritis, unspecified knee: Secondary | ICD-10-CM | POA: Diagnosis not present

## 2013-02-26 DIAGNOSIS — IMO0002 Reserved for concepts with insufficient information to code with codable children: Secondary | ICD-10-CM | POA: Diagnosis not present

## 2013-03-01 ENCOUNTER — Telehealth: Payer: Self-pay | Admitting: *Deleted

## 2013-03-01 NOTE — Telephone Encounter (Signed)
Received surgical clearance from Dr. Gladstone Lighter for pt to have TKA of the left knee.   She is a former patient of Dr. Verl Blalock & had been referred to Dr. Meda Coffee  I have called & talked with pt & scheduled her appointment for 03/05/13 with Dr. Meda Coffee for surgical clearance. Pt agrees with this & is aware Dr. Verl Blalock referred her to Dr. Meda Coffee. I will leave surgical clearance form with Dr. Francesca Oman nurse,Lynn.  Horton Chin RN

## 2013-03-05 ENCOUNTER — Encounter: Payer: Self-pay | Admitting: Cardiology

## 2013-03-05 ENCOUNTER — Ambulatory Visit (INDEPENDENT_AMBULATORY_CARE_PROVIDER_SITE_OTHER): Payer: Medicare Other | Admitting: Cardiology

## 2013-03-05 VITALS — BP 126/78 | HR 63 | Ht 64.0 in | Wt 181.0 lb

## 2013-03-05 DIAGNOSIS — R5381 Other malaise: Secondary | ICD-10-CM | POA: Diagnosis not present

## 2013-03-05 DIAGNOSIS — R0602 Shortness of breath: Secondary | ICD-10-CM | POA: Diagnosis not present

## 2013-03-05 DIAGNOSIS — I272 Pulmonary hypertension, unspecified: Secondary | ICD-10-CM

## 2013-03-05 DIAGNOSIS — R011 Cardiac murmur, unspecified: Secondary | ICD-10-CM

## 2013-03-05 DIAGNOSIS — R5383 Other fatigue: Secondary | ICD-10-CM

## 2013-03-05 DIAGNOSIS — I2789 Other specified pulmonary heart diseases: Secondary | ICD-10-CM

## 2013-03-05 DIAGNOSIS — Z01810 Encounter for preprocedural cardiovascular examination: Secondary | ICD-10-CM

## 2013-03-05 MED ORDER — FUROSEMIDE 20 MG PO TABS: 20.0000 mg | ORAL_TABLET | Freq: Every day | ORAL | Status: DC

## 2013-03-05 NOTE — Progress Notes (Signed)
Patient ID: [name], female   DOB: 1927-05-19, 80 y.o.   MRN: [id number]    Patient Name: [name] Date of Encounter: 03/05/2013  Primary Care Provider:  Antony Blackbird, MD Primary Cardiologist:  Ena Dawley, H (formerlly Dr Verl Blalock)  Problem List   Past Medical History  Diagnosis Date  . Atrial fibrillation   . Palpitations   . Unspecified essential hypertension   . Personal history of other diseases of digestive system   . Other abnormal glucose   . Odynophagia    Past Surgical History  Procedure Laterality Date  . Laryngoscopy / bronchoscopy / esophagoscopy  2010    Dr. Benjamine Mola  . Laminectomy  05/2007    foraminotomy, L4-5 laminectomy  . Cholecystectomy  10/2005    Dr. Effie Shy  . Colonoscopy w/ biopsies  2005    Dr. Bing Plume  . Abdominal hysterectomy    . Appendectomy      Allergies  Allergies  Allergen Reactions  . Diltiazem Hcl   . Sulfonamide Derivatives    HPI Mrs Heinkel is a former patient of Dr Verl Blalock. She was last seen a year ago and was doing remarkably well. She has h/o paroxysmal atrial fibrillation and mild pulmonary hypertension.  She is coming today for a 1 year follow up and preoperative evaluation prior to the knee replacement surgery.  She is doing relatively well. She states that she hasn't walk as much as she wishes because she is limited by pain in her knees. She denies chest pain. She feels just slightly more short of breath, that she attributes to deconditioning but can certainly walk 2 blocks. Occasional palpitations not lasting more than few seconds. She denies syncope, no orthopnea or PND. She has developed mild lower extremity edema. She is very compliant with her medications and her BP has been controlled.   She has a history of significant bradycardia with beta blockers and diltiazem particularly.   Home Medications  Prior to Admission medications   Medication Sig Start Date End Date Taking? Authorizing Provider  amLODipine  (NORVASC) 5 MG tablet Take 1 tablet (5 mg total) by mouth daily. 05/16/12  Yes Renella Cunas, MD  aspirin 81 MG tablet Take 81 mg by mouth daily.     Yes Historical Provider, MD  Cholecalciferol (VITAMIN D3) 1000 UNITS CAPS Take 2,000 Units by mouth.   Yes Historical Provider, MD  Coenzyme Q10 (CO Q 10 PO) Take by mouth daily.   Yes Historical Provider, MD  COLCRYS 0.6 MG tablet TAKE ONE TABLET DAILY 02/20/13  Yes Historical Provider, MD  ferrous sulfate 325 (65 FE) MG tablet Take 325 mg by mouth daily with breakfast.     Yes Historical Provider, MD  fish oil-omega-3 fatty acids 1000 MG capsule Take 2 g by mouth daily.     Yes Historical Provider, MD  FLOVENT HFA 110 MCG/ACT inhaler as needed. 02/23/12  Yes Historical Provider, MD  montelukast (SINGULAIR) 10 MG tablet Take 10 mg by mouth at bedtime.     Yes Historical Provider, MD  Multiple Vitamin (MULTIVITAMIN PO) Take by mouth daily.     Yes Historical Provider, MD  oxyCODONE-acetaminophen (PERCOCET) 10-325 MG per tablet TAKE 1/2 TABLET DAILY AS NEEDED 02/26/13  Yes Historical Provider, MD  PANTOPRAZOLE SODIUM PO Take by mouth 2 (two) times daily.     Yes Historical Provider, MD  pravastatin (PRAVACHOL) 20 MG tablet Take 20 mg by mouth daily.     Yes Historical Provider, MD  sertraline (ZOLOFT) 50 MG tablet Take 1 tablet by mouth daily. 03/15/12  Yes Historical Provider, MD  ULORIC 40 MG tablet Take 1 tablet by mouth daily. 03/15/12  Yes Historical Provider, MD  VENTOLIN HFA 108 (90 BASE) MCG/ACT inhaler as needed. 03/06/12  Yes Historical Provider, MD   Family History  Family History  Problem Relation Age of Onset  . Heart disease Mother   . Diabetes Father    Social History  History   Social History  . Marital Status: Widowed    Spouse Name: N/A    Number of Children: N/A  . Years of Education: N/A   Occupational History  . Not on file.   Social History Main Topics  . Smoking status: Never Smoker   . Smokeless tobacco: Never Used    . Alcohol Use: No  . Drug Use: No  . Sexual Activity: Not on file   Other Topics Concern  . Not on file   Social History Narrative  . No narrative on file    Review of Systems, as per HPI, otherwise negative General:  No chills, fever, night sweats or weight changes.  Cardiovascular:  No chest pain, dyspnea on exertion, edema, orthopnea, palpitations, paroxysmal nocturnal dyspnea. Dermatological: No rash, lesions/masses Respiratory: No cough, dyspnea Urologic: No hematuria, dysuria Abdominal:   No nausea, vomiting, diarrhea, bright red blood per rectum, melena, or hematemesis Neurologic:  No visual changes, wkns, changes in mental status. All other systems reviewed and are otherwise negative except as noted above.  Physical Exam  Blood pressure 126/78, pulse 63, height 5\' 4"  (1.626 m), weight 181 lb (82.101 kg), SpO2 91.00%.  General: Pleasant, NAD Psych: Normal affect. Neuro: Alert and oriented X 3. Moves all extremities spontaneously. HEENT: Normal  Neck: Supple without bruits or JVD. Lungs:  Resp regular and unlabored, CTA. Heart: RRR no s3, s4, or murmurs. Abdomen: Soft, non-tender, non-distended, BS + x 4.  Extremities: No clubbing, cyanosis, mild non-pitting B/L ankle edema. DP/PT/Radials 2+ and equal bilaterally.  Labs:  No results found for this basename: CKTOTAL, CKMB, TROPONINI,  in the last 72 hours Lab Results  Component Value Date   WBC 7.2 06/20/2010   HGB 11.8* 06/20/2010   HCT 34.8* 06/20/2010   MCV 94.3 06/20/2010   PLT 153 06/20/2010   No results found for this basename: NA, K, CL, CO2, BUN, CREATININE, CALCIUM, LABALBU, PROT, BILITOT, ALKPHOS, ALT, AST, GLUCOSE,  in the last 168 hours Lab Results  Component Value Date   CHOL  Value: 118        ATP III CLASSIFICATION:  <200     mg/dL   Desirable  200-239  mg/dL   Borderline High  >=240    mg/dL   High        06/07/2008   HDL 35* 06/07/2008   LDLCALC  Value: 66        Total Cholesterol/HDL:CHD Risk Coronary  Heart Disease Risk Table                     Men   Women  1/2 Average Risk   3.4   3.3  Average Risk       5.0   4.4  2 X Average Risk   9.6   7.1  3 X Average Risk  23.4   11.0        Use the calculated Patient Ratio above and the CHD Risk Table to determine the patient's CHD Risk.  ATP III CLASSIFICATION (LDL):  <100     mg/dL   Optimal  100-129  mg/dL   Near or Above                    Optimal  130-159  mg/dL   Borderline  160-189  mg/dL   High  >190     mg/dL   Very High 06/07/2008   TRIG 85 06/07/2008   Accessory Clinical Findings  Echocardiogram - 06/07/2008  1. Left ventricle: The cavity size was normal. Wall thickness was increased in a pattern of moderate LVH. The estimated ejection fraction was 65%. Wall motion was normal; there were no regional wall motion abnormalities. 2. Left atrium: The atrium was mildly to moderately dilated. 3. Right ventricle: The cavity size was mildly to moderately dilated. Systolic function was low normal. The estimated peak pressure was 98mm Hg. 4. Pulmonary arteries: PA peak pressure: 39mm Hg (S).  ECG - SR, left axis deviation, LAFB, poor R wave progression in the anterior leads, unchanged from prior in 2014   Assessment & Plan  A very pleasant 80 year old female with h/o hypertension, paroxysmal atrial fibrillation and known mild pulmonary hypertension who is coming for a preoperative evaluation for a planned total knee replacement. The patient has no symptoms of angina, and only minimal shortness of breath on exertion. She can certainly achieve 4 METS. Because of her known pulmonary hypertension, we will repeat echocardiogram to re-evaluate right and left intracardiac pressures. We will start the patient on a small dose of Lasix for a mild lower extremity edema and see her in 2 weeks.  We will check CBC, CMP, TSH.    Follow up 2 weeks  CBC, CMP, TSH  Dorothy Spark, MD, [facility] 03/05/2013, 3:34 PM

## 2013-03-05 NOTE — Patient Instructions (Signed)
Your physician recommends that you return for lab work today for CBC, TSH and Comp Met.  Your physician has requested that you have an echocardiogram before 2 week follow up appt. Echocardiography is a painless test that uses sound waves to create images of your heart. It provides your doctor with information about the size and shape of your heart and how well your heart's chambers and valves are working. This procedure takes approximately one hour. There are no restrictions for this procedure.  Your physician recommends that you schedule a follow-up appointment in 2 weeks after echocardiogram.   Your physician has recommended you make the following change in your medication:   1. Start Lasix 20 daily for 2 weeks only.

## 2013-03-06 LAB — COMPREHENSIVE METABOLIC PANEL
ALT: 15 U/L (ref 0–35)
AST: 23 U/L (ref 0–37)
Albumin: 4.1 g/dL (ref 3.5–5.2)
Alkaline Phosphatase: 86 U/L (ref 39–117)
BUN: 22 mg/dL (ref 6–23)
CO2: 26 mEq/L (ref 19–32)
Calcium: 9.5 mg/dL (ref 8.4–10.5)
Chloride: 107 mEq/L (ref 96–112)
Creatinine, Ser: 1.5 mg/dL — ABNORMAL HIGH (ref 0.4–1.2)
GFR: 36.02 mL/min — ABNORMAL LOW (ref >60.00)
Glucose, Bld: 103 mg/dL — ABNORMAL HIGH (ref 70–99)
Potassium: 4.2 mEq/L (ref 3.5–5.1)
Sodium: 140 mEq/L (ref 135–145)
Total Bilirubin: 0.5 mg/dL (ref 0.3–1.2)
Total Protein: 6.8 g/dL (ref 6.0–8.3)

## 2013-03-06 LAB — CBC
HCT: 31.9 % — ABNORMAL LOW (ref 36.0–46.0)
Hemoglobin: 10.9 g/dL — ABNORMAL LOW (ref 12.0–15.0)
MCHC: 34 g/dL (ref 30.0–36.0)
MCV: 92.9 fl (ref 78.0–100.0)
Platelets: 193 10*3/uL (ref 150.0–400.0)
RBC: 3.44 Mil/uL — ABNORMAL LOW (ref 3.87–5.11)
RDW: 14.2 % (ref 11.5–14.6)
WBC: 6.3 10*3/uL (ref 4.5–10.5)

## 2013-03-06 LAB — TSH: TSH: 0.48 u[IU]/mL (ref 0.35–5.50)

## 2013-03-07 DIAGNOSIS — I272 Pulmonary hypertension, unspecified: Secondary | ICD-10-CM | POA: Insufficient documentation

## 2013-03-12 ENCOUNTER — Encounter: Payer: Self-pay | Admitting: Cardiovascular Disease

## 2013-03-12 ENCOUNTER — Ambulatory Visit (HOSPITAL_COMMUNITY): Payer: Medicare Other | Attending: Cardiology | Admitting: Radiology

## 2013-03-12 DIAGNOSIS — R011 Cardiac murmur, unspecified: Secondary | ICD-10-CM

## 2013-03-12 DIAGNOSIS — R0602 Shortness of breath: Secondary | ICD-10-CM | POA: Insufficient documentation

## 2013-03-12 DIAGNOSIS — I2789 Other specified pulmonary heart diseases: Secondary | ICD-10-CM | POA: Diagnosis not present

## 2013-03-12 DIAGNOSIS — E669 Obesity, unspecified: Secondary | ICD-10-CM | POA: Diagnosis not present

## 2013-03-12 DIAGNOSIS — I4891 Unspecified atrial fibrillation: Secondary | ICD-10-CM | POA: Insufficient documentation

## 2013-03-12 DIAGNOSIS — Z01810 Encounter for preprocedural cardiovascular examination: Secondary | ICD-10-CM | POA: Diagnosis not present

## 2013-03-12 NOTE — Progress Notes (Signed)
Echocardiogram performed.  

## 2013-03-19 ENCOUNTER — Encounter: Payer: Self-pay | Admitting: Cardiology

## 2013-03-19 ENCOUNTER — Ambulatory Visit (INDEPENDENT_AMBULATORY_CARE_PROVIDER_SITE_OTHER): Payer: Medicare Other | Admitting: Cardiology

## 2013-03-19 VITALS — BP 144/56 | HR 63 | Ht 63.0 in | Wt 177.0 lb

## 2013-03-19 DIAGNOSIS — I10 Essential (primary) hypertension: Secondary | ICD-10-CM

## 2013-03-19 DIAGNOSIS — I4891 Unspecified atrial fibrillation: Secondary | ICD-10-CM

## 2013-03-19 NOTE — Progress Notes (Signed)
Patient ID: [name], female   DOB: [birth year], 80 y.o.   MRN: [id number]    Patient Name: [name] Date of Encounter: 03/19/2013  Primary Care Provider:  Antony Blackbird, MD Primary Cardiologist:  [name], H (formerlly [name])  Problem List   Past Medical History  Diagnosis Date  . Atrial fibrillation   . Palpitations   . Unspecified essential hypertension   . Personal history of other diseases of digestive system   . Other abnormal glucose   . Odynophagia    Past Surgical History  Procedure Laterality Date  . Laryngoscopy / bronchoscopy / esophagoscopy  2010    Michelle. Benjamine [name]  . Laminectomy  05/2007    foraminotomy, L4-5 laminectomy  . Cholecystectomy  10/2005    Michelle. Effie [name]  . Colonoscopy w/ biopsies  2005    Michelle. Bing [name]  . Abdominal hysterectomy    . Appendectomy     Allergies  Allergies  Allergen Reactions  . Diltiazem Hcl   . Sulfonamide Derivatives    HPI [name] is a former patient of [name]. She was last seen a year ago and was doing remarkably well. She has h/o paroxysmal atrial fibrillation and mild pulmonary hypertension.  She is coming today for a 1 year follow up and preoperative evaluation prior to the knee replacement surgery.  She is doing relatively well. She states that she hasn't walk as much as she wishes because she is limited by pain in her knees. She denies chest pain. She feels just slightly more short of breath, that she attributes to deconditioning but can certainly walk 2 blocks. Occasional palpitations not lasting more than few seconds. She denies syncope, no orthopnea or PND. She has developed mild lower extremity edema. She is very compliant with her medications and her BP has been controlled.   She has a history of significant bradycardia with beta blockers and diltiazem particularly.   She is coming after two weeks, she states that she was not taking furosemide as she developed LE cramping after it. Her LE edema is only  at night. OTherwise she has no complains.   Labs were checked, all normal, Crea improved, currently 1.5, previously 1.77.  Home Medications  Prior to Admission medications   Medication Sig Start Date End Date Taking? Authorizing Provider  amLODipine (NORVASC) 5 MG tablet Take 1 tablet (5 mg total) by mouth daily. 05/16/12  Yes Michelle Cunas, MD  aspirin 81 MG tablet Take 81 mg by mouth daily.     Yes Historical Provider, MD  Cholecalciferol (VITAMIN D3) 1000 UNITS CAPS Take 2,000 Units by mouth.   Yes Historical Provider, MD  Coenzyme Q10 (CO Q 10 PO) Take by mouth daily.   Yes Historical Provider, MD  COLCRYS 0.6 MG tablet TAKE ONE TABLET DAILY 02/20/13  Yes Historical Provider, MD  ferrous sulfate 325 (65 FE) MG tablet Take 325 mg by mouth daily with breakfast.     Yes Historical Provider, MD  fish oil-omega-3 fatty acids 1000 MG capsule Take 2 g by mouth daily.     Yes Historical Provider, MD  FLOVENT HFA 110 MCG/ACT inhaler as needed. 02/23/12  Yes Historical Provider, MD  montelukast (SINGULAIR) 10 MG tablet Take 10 mg by mouth at bedtime.     Yes Historical Provider, MD  Multiple Vitamin (MULTIVITAMIN PO) Take by mouth daily.     Yes Historical Provider, MD  oxyCODONE-acetaminophen (PERCOCET) 10-325 MG per tablet TAKE 1/2 TABLET DAILY AS  NEEDED 02/26/13  Yes Historical Provider, MD  PANTOPRAZOLE SODIUM PO Take by mouth 2 (two) times daily.     Yes Historical Provider, MD  pravastatin (PRAVACHOL) 20 MG tablet Take 20 mg by mouth daily.     Yes Historical Provider, MD  sertraline (ZOLOFT) 50 MG tablet Take 1 tablet by mouth daily. 03/15/12  Yes Historical Provider, MD  ULORIC 40 MG tablet Take 1 tablet by mouth daily. 03/15/12  Yes Historical Provider, MD  VENTOLIN HFA 108 (90 BASE) MCG/ACT inhaler as needed. 03/06/12  Yes Historical Provider, MD   Family History  Family History  Problem Relation Age of Onset  . Heart disease Mother   . Diabetes Father    Social History  History    Social History  . Marital Status: Widowed    Spouse Name: N/A    Number of Children: N/A  . Years of Education: N/A   Occupational History  . Not on file.   Social History Main Topics  . Smoking status: Never Smoker   . Smokeless tobacco: Never Used  . Alcohol Use: No  . Drug Use: No  . Sexual Activity: Not on file   Other Topics Concern  . Not on file   Social History Narrative  . No narrative on file    Review of Systems, as per HPI, otherwise negative General:  No chills, fever, night sweats or weight changes.  Cardiovascular:  No chest pain, dyspnea on exertion, edema, orthopnea, palpitations, paroxysmal nocturnal dyspnea. Dermatological: No rash, lesions/masses Respiratory: No cough, dyspnea Urologic: No hematuria, dysuria Abdominal:   No nausea, vomiting, diarrhea, bright red blood per rectum, melena, or hematemesis Neurologic:  No visual changes, wkns, changes in mental status. All other systems reviewed and are otherwise negative except as noted above.  Physical Exam  Blood pressure 144/56, pulse 63, height 5\' 3"  (1.6 m), weight 177 lb (80.287 kg).  General: Pleasant, NAD Psych: Normal affect. Neuro: Alert and oriented X 3. Moves all extremities spontaneously. HEENT: Normal  Neck: Supple without bruits or JVD. Lungs:  Resp regular and unlabored, CTA. Heart: RRR no s3, s4, or murmurs. Abdomen: Soft, non-tender, non-distended, BS + x 4.  Extremities: No clubbing, cyanosis, minimal non-pitting B/L ankle edema. DP/PT/Radials 2+ and equal bilaterally.  Labs:  No results found for this basename: CKTOTAL, CKMB, TROPONINI,  in the last 72 hours Lab Results  Component Value Date   WBC 6.3 03/05/2013   HGB 10.9* 03/05/2013   HCT 31.9* 03/05/2013   MCV 92.9 03/05/2013   PLT 193.0 03/05/2013   No results found for this basename: NA, K, CL, CO2, BUN, CREATININE, CALCIUM, LABALBU, PROT, BILITOT, ALKPHOS, ALT, AST, GLUCOSE,  in the last 168 hours Lab Results   Component Value Date   CHOL  Value: 118        ATP III CLASSIFICATION:  <200     mg/dL   Desirable  200-239  mg/dL   Borderline High  >=240    mg/dL   High        06/07/2008   HDL 35* 06/07/2008   LDLCALC  Value: 66        Total Cholesterol/HDL:CHD Risk Coronary Heart Disease Risk Table                     Men   Women  1/2 Average Risk   3.4   3.3  Average Risk       5.0   4.4  2 X  Average Risk   9.6   7.1  3 X Average Risk  23.4   11.0        Use the calculated Patient Ratio above and the CHD Risk Table to determine the patient's CHD Risk.        ATP III CLASSIFICATION (LDL):  <100     mg/dL   Optimal  100-129  mg/dL   Near or Above                    Optimal  130-159  mg/dL   Borderline  160-189  mg/dL   High  >190     mg/dL   Very High 06/07/2008   TRIG 85 06/07/2008   Accessory Clinical Findings  Echocardiogram - 06/07/2008  1. Left ventricle: The cavity size was normal. Wall thickness was increased in a pattern of moderate LVH. The estimated ejection fraction was 65%. Wall motion was normal; there were no regional wall motion abnormalities. 2. Left atrium: The atrium was mildly to moderately dilated. 3. Right ventricle: The cavity size was mildly to moderately dilated. Systolic function was low normal. The estimated peak pressure was 61mm Hg. 4. Pulmonary arteries: PA peak pressure: 107mm Hg (S).   Echocardiogram 03/12/2013  - Left ventricle: The cavity size was normal. There was moderate concentric hypertrophy. Systolic function was normal. The estimated ejection fraction was in the range of 55% to 60%. Wall motion was normal; there were no regional wall motion abnormalities. - Mitral valve: Calcified annulus. - Left atrium: The atrium was mildly dilated. - Right ventricle: normal size and function - Pulmonary arteries: PA peak pressure: 28mm Hg (S). Impressions:  - Compared to 2010, PA pressure is lower, RV size is normal and RV function has improved.  ECG - SR, left axis  deviation, LAFB, poor R wave progression in the anterior leads, unchanged from prior in 2014   Assessment & Plan  A very pleasant 80 year old female with h/o hypertension, paroxysmal atrial fibrillation and history of mild pulmonary hypertension who is coming for a preoperative evaluation for a planned total knee replacement. The patient has no symptoms of angina, and only minimal shortness of breath on exertion. She can certainly achieve 4 METS. Because of her known pulmonary hypertension, we repeated echocardiogram to re-evaluate right and left intracardiac pressures.  Echocardiogram from 03/12/2013 showed improved right ventricular size and function that is now normal and also improved RVSP that is also in the normal range.  Therefore there is currently no contraindication for this patient to undergo planned orthopedic surgery.   Follow up in 1 year.  [name], Lemmie Evens, MD, [facility] 03/19/2013, 9:52 AM

## 2013-03-19 NOTE — Patient Instructions (Signed)
Your physician recommends that you continue on your current medications as directed. Please refer to the Current Medication list given to you today.  Your physician wants you to follow-up in: 1 Year with Dr Meda Coffee. You will receive a reminder letter in the mail two months in advance. If you don't receive a letter, please call our office to schedule the follow-up appointment.

## 2013-03-29 NOTE — H&P (Signed)
TOTAL KNEE ADMISSION H&P  Patient is being admitted for left total knee arthroplasty.  Subjective:  Chief Complaint:left knee pain.  HPI: [name], 80 y.o. female, has a history of pain and functional disability in the left knee due to arthritis and has failed non-surgical conservative treatments for greater than 12 weeks to includeNSAID's and/or analgesics, corticosteriod injections, viscosupplementation injections, use of assistive devices and activity modification.  Onset of symptoms was gradual, starting 5 years ago with gradually worsening course since that time. The patient noted no past surgery on the left knee(s).  Patient currently rates pain in the left knee(s) at 7 out of 10 with activity. Patient has night pain, worsening of pain with activity and weight bearing, pain that interferes with activities of daily living, pain with passive range of motion, crepitus and joint swelling.  Patient has evidence of periarticular osteophytes and joint space narrowing by imaging studies.  There is no active infection.  Patient Active Problem List   Diagnosis Date Noted  . Pulmonary hypertension 03/07/2013  . Anemia, unspecified 03/19/2012  . Unspecified deficiency anemia 03/19/2012  . Dizziness 07/09/2010  . CHEST PAIN UNSPECIFIED 07/30/2009  . HYPERTENSION, UNSPECIFIED 08/19/2008  . PAROXYSMAL ATRIAL FIBRILLATION 08/19/2008  . GASTROESOPHAGEAL REFLUX DISEASE, HX OF 08/19/2008   Past Medical History  Diagnosis Date  . Atrial fibrillation   . Palpitations   . Unspecified essential hypertension   . Personal history of other diseases of digestive system   . Other abnormal glucose   . Odynophagia     Past Surgical History  Procedure Laterality Date  . Laryngoscopy / bronchoscopy / esophagoscopy  2010    Dr. Benjamine Mola  . Laminectomy  05/2007    foraminotomy, L4-5 laminectomy  . Cholecystectomy  10/2005    Dr. Effie Shy  . Colonoscopy w/ biopsies  2005    Dr. Bing Plume  . Abdominal  hysterectomy    . Appendectomy       Current outpatient prescriptions: amLODipine (NORVASC) 5 MG tablet, Take 1 tablet (5 mg total) by mouth daily., Disp: 180 tablet, Rfl: 3;   aspirin 81 MG tablet, Take 81 mg by mouth daily.  , Disp: , Rfl: ;   Cholecalciferol (VITAMIN D3) 1000 UNITS CAPS, Take 2,000 Units by mouth., Disp: , Rfl: ;   Coenzyme Q10 (CO Q 10 PO), Take by mouth daily., Disp: , Rfl: ;   COLCRYS 0.6 MG tablet, TAKE ONE TABLET DAILY, Disp: , Rfl:  ferrous sulfate 325 (65 FE) MG tablet, Take 325 mg by mouth daily with breakfast.  , Disp: , Rfl: ;   fish oil-omega-3 fatty acids 1000 MG capsule, Take 2 g by mouth daily.  , Disp: , Rfl: ;  FLOVENT HFA 110 MCG/ACT inhaler, as needed., Disp: , Rfl: ;   montelukast (SINGULAIR) 10 MG tablet, Take 10 mg by mouth at bedtime.  , Disp: , Rfl: ;   Multiple Vitamin (MULTIVITAMIN PO), Take by mouth daily.  , Disp: , Rfl:  oxyCODONE-acetaminophen (PERCOCET) 10-325 MG per tablet, TAKE 1/2 TABLET DAILY AS NEEDED, Disp: , Rfl: ;   PANTOPRAZOLE SODIUM PO, Take by mouth 2 (two) times daily.  , Disp: , Rfl: ;   pravastatin (PRAVACHOL) 20 MG tablet, Take 20 mg by mouth daily.  , Disp: , Rfl: ;   sertraline (ZOLOFT) 50 MG tablet, Take 1 tablet by mouth daily., Disp: , Rfl: ;   ULORIC 40 MG tablet, Take 1 tablet by mouth daily., Disp: , Rfl:  VENTOLIN  HFA 108 (90 BASE) MCG/ACT inhaler, as needed., Disp: , Rfl:   Allergies  Allergen Reactions  . Diltiazem Hcl   . Sulfonamide Derivatives     History  Substance Use Topics  . Smoking status: Never Smoker   . Smokeless tobacco: Never Used  . Alcohol Use: No    Family History  Problem Relation Age of Onset  . Heart disease Mother   . Diabetes Father      Review of Systems  Constitutional: Negative.   HENT: Negative.   Eyes: Negative.   Respiratory: Negative.   Cardiovascular: Negative.   Gastrointestinal: Positive for abdominal pain, diarrhea and constipation. Negative for heartburn, nausea,  vomiting, blood in stool and melena.       Secondary to diverticulitis  Genitourinary: Negative.   Musculoskeletal: Negative for back pain, falls, myalgias and neck pain.       Left knee pain  Skin: Negative.   Neurological: Negative.   Endo/Heme/Allergies: Negative.   Psychiatric/Behavioral: Negative.     Objective:  Physical Exam  Constitutional: She is oriented to person, place, and time. She appears well-developed and well-nourished. No distress.  HENT:  Head: Normocephalic and atraumatic.  Right Ear: External ear normal.  Left Ear: External ear normal.  Nose: Nose normal.  Mouth/Throat: Oropharynx is clear and moist.  Eyes: Conjunctivae and EOM are normal.  Neck: Normal range of motion. Neck supple.  Cardiovascular: Normal rate, regular rhythm and intact distal pulses.   Murmur heard.  Systolic murmur is present with a grade of 3/6  Patient has history of Afib but RRR on exam  Respiratory: Effort normal and breath sounds normal. No respiratory distress. She has no wheezes.  GI: Soft. Bowel sounds are normal. She exhibits no distension. There is no tenderness.  Musculoskeletal:       Right hip: Normal.       Left hip: Normal.       Right knee: She exhibits decreased range of motion. She exhibits no swelling, no effusion and no erythema. Tenderness found. Medial joint line tenderness noted. No lateral joint line tenderness noted.       Left knee: She exhibits decreased range of motion and swelling. She exhibits no effusion and no erythema. Tenderness found. Medial joint line and lateral joint line tenderness noted.       Right lower leg: She exhibits no tenderness and no swelling.       Left lower leg: She exhibits no tenderness and no swelling.  Neurological: She is alert and oriented to person, place, and time. She has normal strength and normal reflexes. No sensory deficit.  Skin: No rash noted. She is not diaphoretic. No erythema.  Psychiatric: She has a normal mood and  affect. Her behavior is normal.     Vitals Weight: 175 lb Height: 60 in Body Surface Area: 1.83 m Body Mass Index: 34.18 kg/m Pulse: 86 (Regular) BP: 153/78 (Sitting, Left Arm, Standard)  Imaging Review Plain radiographs demonstrate severe degenerative joint disease of the left knee(s). The overall alignment ismild varus. The bone quality appears to be good for age and reported activity level.  Assessment/Plan:  End stage arthritis, left knee   The patient history, physical examination, clinical judgment of the provider and imaging studies are consistent with end stage degenerative joint disease of the left knee(s) and total knee arthroplasty is deemed medically necessary. The treatment options including medical management, injection therapy arthroscopy and arthroplasty were discussed at length. The risks and benefits of total knee  arthroplasty were presented and reviewed. The risks due to aseptic loosening, infection, stiffness, patella tracking problems, thromboembolic complications and other imponderables were discussed. The patient acknowledged the explanation, agreed to proceed with the plan and consent was signed. Patient is being admitted for inpatient treatment for surgery, pain control, PT, OT, prophylactic antibiotics, VTE prophylaxis, progressive ambulation and ADL's and discharge planning. The patient is planning to be discharged to skilled nursing facility    [place], [place]

## 2013-03-30 ENCOUNTER — Encounter (HOSPITAL_COMMUNITY): Payer: Self-pay | Admitting: Pharmacy Technician

## 2013-03-30 ENCOUNTER — Other Ambulatory Visit (HOSPITAL_COMMUNITY): Payer: Self-pay | Admitting: Orthopedic Surgery

## 2013-03-30 NOTE — Progress Notes (Signed)
Cardiac clearance/lov note dr Ena Dawley epic Cardiac clearance note dr Meda Coffee on chart Echo 03-12-13 epic ekg 03-05-13 epic

## 2013-03-30 NOTE — Patient Instructions (Addendum)
[place]  03/30/2013   Your procedure is scheduled on: Wednesday February 25th  Report to [facility] at  1100 AM.  Call this number if you have problems the morning of surgery [phone]   Remember: please bring inhaler on day of surgery   Do not eat food :After Midnight.   clear liquids midnight until 0730 am day of surgery, then nothing by mouth.   Take these medicines the morning of surgery with A SIP OF WATER: albuterol, amlodipine, flovent, oxycodone if needed, pantroprazole                                SEE [place] PREPARING FOR SURGERY SHEET             You may not have any metal on your body including hair pins and piercings  Do not wear jewelry, make-up.  Do not wear lotions, powders, or perfumes. No  Deodorant is to be worn.   Men may shave face and neck.  Do not bring valuables to the hospital. [place].  Contacts, dentures or bridgework may not be worn into surgery.  Leave suitcase in the car. After surgery it may be brought to your room.  For patients admitted to the hospital, checkout time is 11:00 AM the day of discharge.   Please read over the following fact sheets that you were given: [facility] Preparing for surgery sheet, MRSA information, incentive spirometer sheet, blood fact sheet  Call Paulette Blanch RN pre op nurse if needed [place].  PATIENT SIGNATURE___________________________________________  NURSE SIGNATURE_____________________________________________

## 2013-04-02 ENCOUNTER — Other Ambulatory Visit: Payer: Self-pay

## 2013-04-02 ENCOUNTER — Encounter (HOSPITAL_COMMUNITY): Payer: Self-pay

## 2013-04-02 ENCOUNTER — Encounter (HOSPITAL_COMMUNITY)
Admission: RE | Admit: 2013-04-02 | Discharge: 2013-04-02 | Disposition: A | Discharge status: Home / Self Care | Payer: Medicare Other | Source: Ambulatory Visit | Attending: Orthopedic Surgery | Admitting: Orthopedic Surgery

## 2013-04-02 ENCOUNTER — Ambulatory Visit (HOSPITAL_COMMUNITY)
Admission: RE | Admit: 2013-04-02 | Discharge: 2013-04-02 | Disposition: A | Discharge status: Home / Self Care | Payer: Medicare Other | Source: Ambulatory Visit | Attending: Surgical | Admitting: Surgical

## 2013-04-02 DIAGNOSIS — J45909 Unspecified asthma, uncomplicated: Secondary | ICD-10-CM

## 2013-04-02 DIAGNOSIS — Z01812 Encounter for preprocedural laboratory examination: Secondary | ICD-10-CM

## 2013-04-02 DIAGNOSIS — Z01818 Encounter for other preprocedural examination: Secondary | ICD-10-CM

## 2013-04-02 DIAGNOSIS — Z86718 Personal history of other venous thrombosis and embolism: Secondary | ICD-10-CM

## 2013-04-02 DIAGNOSIS — I4891 Unspecified atrial fibrillation: Secondary | ICD-10-CM

## 2013-04-02 HISTORY — DX: Unspecified asthma, uncomplicated: J45.909

## 2013-04-02 HISTORY — DX: Acute embolism and thrombosis of unspecified deep veins of unspecified lower extremity: I82.409

## 2013-04-02 HISTORY — DX: Diverticulitis of intestine, part unspecified, without perforation or abscess without bleeding: K57.92

## 2013-04-02 HISTORY — DX: Depression, unspecified: F32.A

## 2013-04-02 HISTORY — DX: Pneumonia, unspecified organism: J18.9

## 2013-04-02 HISTORY — DX: Unspecified osteoarthritis, unspecified site: M19.90

## 2013-04-02 HISTORY — DX: Pure hypercholesterolemia, unspecified: E78.00

## 2013-04-02 HISTORY — DX: Gout, unspecified: M10.9

## 2013-04-02 HISTORY — DX: Unspecified cataract: H26.9

## 2013-04-02 HISTORY — DX: Gastro-esophageal reflux disease without esophagitis: K21.9

## 2013-04-02 HISTORY — DX: Major depressive disorder, single episode, unspecified: F32.9

## 2013-04-02 HISTORY — DX: Anemia, unspecified: D64.9

## 2013-04-02 LAB — COMPREHENSIVE METABOLIC PANEL
ALT: 11 U/L (ref 0–35)
AST: 18 U/L (ref 0–37)
Albumin: 4.4 g/dL (ref 3.5–5.2)
Alkaline Phosphatase: 96 U/L (ref 39–117)
BUN: 22 mg/dL (ref 6–23)
CO2: 26 mEq/L (ref 19–32)
Calcium: 10 mg/dL (ref 8.4–10.5)
Chloride: 101 mEq/L (ref 96–112)
Creatinine, Ser: 1.18 mg/dL — ABNORMAL HIGH (ref 0.50–1.10)
GFR calc Af Amer: 48 mL/min — ABNORMAL LOW (ref >90)
GFR calc non Af Amer: 41 mL/min — ABNORMAL LOW (ref >90)
Glucose, Bld: 104 mg/dL — ABNORMAL HIGH (ref 70–99)
Potassium: 4.2 mEq/L (ref 3.7–5.3)
Sodium: 141 mEq/L (ref 137–147)
Total Bilirubin: 0.5 mg/dL (ref 0.3–1.2)
Total Protein: 7.4 g/dL (ref 6.0–8.3)

## 2013-04-02 LAB — URINALYSIS, ROUTINE W REFLEX MICROSCOPIC
Glucose, UA: NEGATIVE mg/dL
Hgb urine dipstick: NEGATIVE
Ketones, ur: NEGATIVE mg/dL
Nitrite: NEGATIVE
Protein, ur: NEGATIVE mg/dL
Specific Gravity, Urine: 1.025 (ref 1.005–1.030)
Urobilinogen, UA: 1 mg/dL (ref 0.0–1.0)
pH: 6 (ref 5.0–8.0)

## 2013-04-02 LAB — URINE MICROSCOPIC-ADD ON

## 2013-04-02 LAB — CBC
HCT: 34.7 % — ABNORMAL LOW (ref 36.0–46.0)
HEMOGLOBIN: 11.4 g/dL — AB (ref 12.0–15.0)
MCH: 30.6 pg (ref 26.0–34.0)
MCHC: 32.9 g/dL (ref 30.0–36.0)
MCV: 93 fL (ref 78.0–100.0)
PLATELETS: 176 10*3/uL (ref 150–400)
RBC: 3.73 MIL/uL — AB (ref 3.87–5.11)
RDW: 13.1 % (ref 11.5–15.5)
WBC: 5 10*3/uL (ref 4.0–10.5)

## 2013-04-02 LAB — PROTIME-INR
INR: 1.01 (ref 0.00–1.49)
Prothrombin Time: 13.1 seconds (ref 11.6–15.2)

## 2013-04-02 LAB — SURGICAL PCR SCREEN
MRSA, PCR: NEGATIVE
Staphylococcus aureus: POSITIVE — AB

## 2013-04-02 LAB — APTT: aPTT: 27 seconds (ref 24–37)

## 2013-04-02 LAB — ABO/RH: ABO/RH(D): O NEG

## 2013-04-04 ENCOUNTER — Encounter (HOSPITAL_COMMUNITY): Payer: Medicare Other | Admitting: Registered Nurse

## 2013-04-04 ENCOUNTER — Encounter (HOSPITAL_COMMUNITY): Payer: Self-pay | Admitting: *Deleted

## 2013-04-04 ENCOUNTER — Inpatient Hospital Stay (HOSPITAL_COMMUNITY): Payer: Medicare Other

## 2013-04-04 ENCOUNTER — Inpatient Hospital Stay (HOSPITAL_COMMUNITY): Payer: Medicare Other | Admitting: Registered Nurse

## 2013-04-04 ENCOUNTER — Inpatient Hospital Stay (HOSPITAL_COMMUNITY)
Admission: RE | Admit: 2013-04-04 | Discharge: 2013-04-07 | DRG: 470 | Disposition: A | Discharge status: Skilled Nursing Facility | Payer: Medicare Other | Source: Ambulatory Visit | Attending: Orthopedic Surgery | Admitting: Orthopedic Surgery

## 2013-04-04 ENCOUNTER — Encounter (HOSPITAL_COMMUNITY)
Admission: RE | Disposition: A | Discharge status: Skilled Nursing Facility | Payer: Self-pay | Source: Ambulatory Visit | Attending: Orthopedic Surgery

## 2013-04-04 DIAGNOSIS — M109 Gout, unspecified: Secondary | ICD-10-CM | POA: Diagnosis present

## 2013-04-04 DIAGNOSIS — Z833 Family history of diabetes mellitus: Secondary | ICD-10-CM | POA: Diagnosis not present

## 2013-04-04 DIAGNOSIS — D649 Anemia, unspecified: Secondary | ICD-10-CM | POA: Diagnosis not present

## 2013-04-04 DIAGNOSIS — I10 Essential (primary) hypertension: Secondary | ICD-10-CM | POA: Diagnosis present

## 2013-04-04 DIAGNOSIS — J45909 Unspecified asthma, uncomplicated: Secondary | ICD-10-CM | POA: Diagnosis present

## 2013-04-04 DIAGNOSIS — D62 Acute posthemorrhagic anemia: Secondary | ICD-10-CM | POA: Diagnosis not present

## 2013-04-04 DIAGNOSIS — F3289 Other specified depressive episodes: Secondary | ICD-10-CM | POA: Diagnosis present

## 2013-04-04 DIAGNOSIS — M1710 Unilateral primary osteoarthritis, unspecified knee: Principal | ICD-10-CM | POA: Diagnosis present

## 2013-04-04 DIAGNOSIS — IMO0002 Reserved for concepts with insufficient information to code with codable children: Secondary | ICD-10-CM | POA: Diagnosis not present

## 2013-04-04 DIAGNOSIS — I2789 Other specified pulmonary heart diseases: Secondary | ICD-10-CM | POA: Diagnosis not present

## 2013-04-04 DIAGNOSIS — Z8249 Family history of ischemic heart disease and other diseases of the circulatory system: Secondary | ICD-10-CM | POA: Diagnosis not present

## 2013-04-04 DIAGNOSIS — F329 Major depressive disorder, single episode, unspecified: Secondary | ICD-10-CM | POA: Diagnosis present

## 2013-04-04 DIAGNOSIS — Z6834 Body mass index (BMI) 34.0-34.9, adult: Secondary | ICD-10-CM

## 2013-04-04 DIAGNOSIS — Z96659 Presence of unspecified artificial knee joint: Secondary | ICD-10-CM

## 2013-04-04 DIAGNOSIS — I4891 Unspecified atrial fibrillation: Secondary | ICD-10-CM | POA: Diagnosis not present

## 2013-04-04 DIAGNOSIS — K219 Gastro-esophageal reflux disease without esophagitis: Secondary | ICD-10-CM | POA: Diagnosis present

## 2013-04-04 DIAGNOSIS — M6281 Muscle weakness (generalized): Secondary | ICD-10-CM | POA: Diagnosis not present

## 2013-04-04 DIAGNOSIS — H269 Unspecified cataract: Secondary | ICD-10-CM | POA: Diagnosis present

## 2013-04-04 DIAGNOSIS — S8990XA Unspecified injury of unspecified lower leg, initial encounter: Secondary | ICD-10-CM | POA: Diagnosis not present

## 2013-04-04 DIAGNOSIS — E669 Obesity, unspecified: Secondary | ICD-10-CM | POA: Diagnosis present

## 2013-04-04 DIAGNOSIS — Z7982 Long term (current) use of aspirin: Secondary | ICD-10-CM

## 2013-04-04 DIAGNOSIS — IMO0001 Reserved for inherently not codable concepts without codable children: Secondary | ICD-10-CM | POA: Diagnosis not present

## 2013-04-04 DIAGNOSIS — M25569 Pain in unspecified knee: Secondary | ICD-10-CM | POA: Diagnosis not present

## 2013-04-04 DIAGNOSIS — R42 Dizziness and giddiness: Secondary | ICD-10-CM | POA: Diagnosis not present

## 2013-04-04 DIAGNOSIS — E7800 Pure hypercholesterolemia, unspecified: Secondary | ICD-10-CM | POA: Diagnosis present

## 2013-04-04 DIAGNOSIS — Z471 Aftercare following joint replacement surgery: Secondary | ICD-10-CM | POA: Diagnosis not present

## 2013-04-04 DIAGNOSIS — R262 Difficulty in walking, not elsewhere classified: Secondary | ICD-10-CM | POA: Diagnosis not present

## 2013-04-04 DIAGNOSIS — M1712 Unilateral primary osteoarthritis, left knee: Secondary | ICD-10-CM | POA: Diagnosis present

## 2013-04-04 DIAGNOSIS — Z86718 Personal history of other venous thrombosis and embolism: Secondary | ICD-10-CM | POA: Diagnosis not present

## 2013-04-04 HISTORY — PX: TOTAL KNEE ARTHROPLASTY: SHX125

## 2013-04-04 SURGERY — ARTHROPLASTY, KNEE, TOTAL
Anesthesia: General | Site: Knee | Laterality: Left

## 2013-04-04 MED ORDER — RIVAROXABAN 10 MG PO TABS
10.0000 mg | ORAL_TABLET | Freq: Every day | ORAL | Status: DC
  Administered 2013-04-05 – 2013-04-07 (×3): 10 mg via ORAL
  Filled 2013-04-04 (×4): qty 1

## 2013-04-04 MED ORDER — SODIUM CHLORIDE 0.9 % IJ SOLN
INTRAMUSCULAR | Status: DC | PRN
Start: 2013-04-04 — End: 2013-04-04
  Administered 2013-04-04: 20 mL

## 2013-04-04 MED ORDER — SODIUM CHLORIDE 0.9 % IR SOLN
Status: DC | PRN
  Administered 2013-04-04: 15:00:00

## 2013-04-04 MED ORDER — LIDOCAINE HCL (CARDIAC) 20 MG/ML IV SOLN
INTRAVENOUS | Status: DC | PRN
  Administered 2013-04-04: 40 mg via INTRAVENOUS

## 2013-04-04 MED ORDER — BUPIVACAINE LIPOSOME 1.3 % IJ SUSP
20.0000 mL | Freq: Once | INTRAMUSCULAR | Status: DC
  Filled 2013-04-04: qty 20

## 2013-04-04 MED ORDER — NEOSTIGMINE METHYLSULFATE 1 MG/ML IJ SOLN
INTRAMUSCULAR | Status: AC
  Filled 2013-04-04: qty 10

## 2013-04-04 MED ORDER — VITAMIN D 1000 UNITS PO TABS
2000.0000 [IU] | ORAL_TABLET | Freq: Every day | ORAL | Status: DC
  Administered 2013-04-05 – 2013-04-07 (×3): 2000 [IU] via ORAL
  Filled 2013-04-04 (×4): qty 2

## 2013-04-04 MED ORDER — PROPOFOL 10 MG/ML IV BOLUS
INTRAVENOUS | Status: AC
  Filled 2013-04-04: qty 20

## 2013-04-04 MED ORDER — SODIUM CHLORIDE 0.9 % IJ SOLN
INTRAMUSCULAR | Status: AC
  Filled 2013-04-04: qty 50

## 2013-04-04 MED ORDER — LIDOCAINE HCL (CARDIAC) 20 MG/ML IV SOLN
INTRAVENOUS | Status: AC
  Filled 2013-04-04: qty 5

## 2013-04-04 MED ORDER — AMLODIPINE BESYLATE 5 MG PO TABS
5.0000 mg | ORAL_TABLET | Freq: Every morning | ORAL | Status: DC
  Administered 2013-04-05 – 2013-04-07 (×2): 5 mg via ORAL
  Filled 2013-04-04 (×3): qty 1

## 2013-04-04 MED ORDER — ONDANSETRON HCL 4 MG/2ML IJ SOLN
INTRAMUSCULAR | Status: DC | PRN
  Administered 2013-04-04: 4 mg via INTRAVENOUS

## 2013-04-04 MED ORDER — ALUM & MAG HYDROXIDE-SIMETH 200-200-20 MG/5ML PO SUSP
30.0000 mL | ORAL | Status: DC | PRN
  Administered 2013-04-04: 30 mL via ORAL
  Filled 2013-04-04: qty 30

## 2013-04-04 MED ORDER — BUPIVACAINE LIPOSOME 1.3 % IJ SUSP
INTRAMUSCULAR | Status: DC | PRN
  Administered 2013-04-04: 20 mL

## 2013-04-04 MED ORDER — PROMETHAZINE HCL 25 MG/ML IJ SOLN: 6.2500 mg | INTRAMUSCULAR | Status: DC | PRN

## 2013-04-04 MED ORDER — FENTANYL CITRATE 0.05 MG/ML IJ SOLN
INTRAMUSCULAR | Status: DC | PRN
  Administered 2013-04-04 (×2): 50 ug via INTRAVENOUS
  Administered 2013-04-04: 100 ug via INTRAVENOUS
  Administered 2013-04-04 (×4): 50 ug via INTRAVENOUS

## 2013-04-04 MED ORDER — ALBUTEROL SULFATE (2.5 MG/3ML) 0.083% IN NEBU: 2.5000 mg | INHALATION_SOLUTION | Freq: Four times a day (QID) | RESPIRATORY_TRACT | Status: DC | PRN

## 2013-04-04 MED ORDER — SIMVASTATIN 5 MG PO TABS
5.0000 mg | ORAL_TABLET | Freq: Every day | ORAL | Status: DC
  Administered 2013-04-04 – 2013-04-06 (×3): 5 mg via ORAL
  Filled 2013-04-04 (×4): qty 1

## 2013-04-04 MED ORDER — FEBUXOSTAT 40 MG PO TABS
40.0000 mg | ORAL_TABLET | Freq: Every day | ORAL | Status: DC
  Administered 2013-04-04 – 2013-04-07 (×4): 40 mg via ORAL
  Filled 2013-04-04 (×4): qty 1

## 2013-04-04 MED ORDER — HYDROMORPHONE HCL PF 1 MG/ML IJ SOLN
1.0000 mg | INTRAMUSCULAR | Status: DC | PRN
  Administered 2013-04-04 – 2013-04-05 (×3): 1 mg via INTRAVENOUS
  Filled 2013-04-04 (×4): qty 1

## 2013-04-04 MED ORDER — PROPOFOL 10 MG/ML IV BOLUS
INTRAVENOUS | Status: DC | PRN
  Administered 2013-04-04: 30 mg via INTRAVENOUS
  Administered 2013-04-04: 100 mg via INTRAVENOUS

## 2013-04-04 MED ORDER — CEFAZOLIN SODIUM-DEXTROSE 2-3 GM-% IV SOLR
INTRAVENOUS | Status: AC
  Filled 2013-04-04: qty 50

## 2013-04-04 MED ORDER — LACTATED RINGERS IV SOLN
INTRAVENOUS | Status: DC
  Administered 2013-04-04 – 2013-04-05 (×2): via INTRAVENOUS

## 2013-04-04 MED ORDER — GLYCOPYRROLATE 0.2 MG/ML IJ SOLN
INTRAMUSCULAR | Status: DC | PRN
  Administered 2013-04-04: 0.2 mg via INTRAVENOUS

## 2013-04-04 MED ORDER — METHOCARBAMOL 100 MG/ML IJ SOLN
500.0000 mg | Freq: Four times a day (QID) | INTRAVENOUS | Status: DC | PRN
  Administered 2013-04-04: 500 mg via INTRAVENOUS
  Filled 2013-04-04: qty 5

## 2013-04-04 MED ORDER — FERROUS SULFATE 325 (65 FE) MG PO TABS
325.0000 mg | ORAL_TABLET | Freq: Three times a day (TID) | ORAL | Status: DC
  Administered 2013-04-05 – 2013-04-06 (×3): 325 mg via ORAL
  Filled 2013-04-04 (×11): qty 1

## 2013-04-04 MED ORDER — FENTANYL CITRATE 0.05 MG/ML IJ SOLN
25.0000 ug | INTRAMUSCULAR | Status: DC | PRN
  Administered 2013-04-04 (×4): 25 ug via INTRAVENOUS

## 2013-04-04 MED ORDER — HYDROMORPHONE HCL PF 2 MG/ML IJ SOLN
INTRAMUSCULAR | Status: AC
  Filled 2013-04-04: qty 1

## 2013-04-04 MED ORDER — HYDROMORPHONE HCL PF 1 MG/ML IJ SOLN
INTRAMUSCULAR | Status: DC | PRN
  Administered 2013-04-04 (×4): 0.5 mg via INTRAVENOUS

## 2013-04-04 MED ORDER — ACETAMINOPHEN 650 MG RE SUPP: 650.0000 mg | Freq: Four times a day (QID) | RECTAL | Status: DC | PRN

## 2013-04-04 MED ORDER — PANTOPRAZOLE SODIUM 40 MG PO TBEC
40.0000 mg | DELAYED_RELEASE_TABLET | Freq: Two times a day (BID) | ORAL | Status: DC
  Administered 2013-04-04 – 2013-04-07 (×6): 40 mg via ORAL
  Filled 2013-04-04 (×9): qty 1

## 2013-04-04 MED ORDER — SERTRALINE HCL 50 MG PO TABS
50.0000 mg | ORAL_TABLET | Freq: Every evening | ORAL | Status: DC
  Administered 2013-04-04 – 2013-04-06 (×3): 50 mg via ORAL
  Filled 2013-04-04 (×4): qty 1

## 2013-04-04 MED ORDER — FENTANYL CITRATE 0.05 MG/ML IJ SOLN
INTRAMUSCULAR | Status: AC
  Filled 2013-04-04: qty 5

## 2013-04-04 MED ORDER — NEOSTIGMINE METHYLSULFATE 1 MG/ML IJ SOLN
INTRAMUSCULAR | Status: DC | PRN
  Administered 2013-04-04: 3 mg via INTRAVENOUS

## 2013-04-04 MED ORDER — FENTANYL CITRATE 0.05 MG/ML IJ SOLN
INTRAMUSCULAR | Status: AC
  Filled 2013-04-04: qty 2

## 2013-04-04 MED ORDER — DEXAMETHASONE SODIUM PHOSPHATE 10 MG/ML IJ SOLN
INTRAMUSCULAR | Status: DC | PRN
  Administered 2013-04-04: 10 mg via INTRAVENOUS

## 2013-04-04 MED ORDER — METHOCARBAMOL 500 MG PO TABS
500.0000 mg | ORAL_TABLET | Freq: Four times a day (QID) | ORAL | Status: DC | PRN
  Administered 2013-04-05 – 2013-04-06 (×3): 500 mg via ORAL
  Filled 2013-04-04 (×3): qty 1

## 2013-04-04 MED ORDER — CEFAZOLIN SODIUM 1-5 GM-% IV SOLN
1.0000 g | Freq: Four times a day (QID) | INTRAVENOUS | Status: AC
  Administered 2013-04-04 – 2013-04-05 (×2): 1 g via INTRAVENOUS
  Filled 2013-04-04 (×2): qty 50

## 2013-04-04 MED ORDER — ACETAMINOPHEN 325 MG PO TABS
650.0000 mg | ORAL_TABLET | Freq: Four times a day (QID) | ORAL | Status: DC | PRN
  Administered 2013-04-06 – 2013-04-07 (×2): 650 mg via ORAL
  Filled 2013-04-04 (×2): qty 2

## 2013-04-04 MED ORDER — THROMBIN 5000 UNITS EX SOLR
CUTANEOUS | Status: DC | PRN
  Administered 2013-04-04: 5000 [IU] via TOPICAL

## 2013-04-04 MED ORDER — ONDANSETRON HCL 4 MG/2ML IJ SOLN
INTRAMUSCULAR | Status: AC
  Filled 2013-04-04: qty 2

## 2013-04-04 MED ORDER — ROCURONIUM BROMIDE 100 MG/10ML IV SOLN
INTRAVENOUS | Status: DC | PRN
  Administered 2013-04-04: 40 mg via INTRAVENOUS

## 2013-04-04 MED ORDER — THROMBIN 5000 UNITS EX SOLR
CUTANEOUS | Status: AC
  Filled 2013-04-04: qty 5000

## 2013-04-04 MED ORDER — BISACODYL 10 MG RE SUPP: 10.0000 mg | Freq: Every day | RECTAL | Status: DC | PRN

## 2013-04-04 MED ORDER — SODIUM CHLORIDE 0.9 % IR SOLN
Status: DC | PRN
  Administered 2013-04-04: 1000 mL

## 2013-04-04 MED ORDER — DEXAMETHASONE SODIUM PHOSPHATE 10 MG/ML IJ SOLN
INTRAMUSCULAR | Status: AC
  Filled 2013-04-04: qty 1

## 2013-04-04 MED ORDER — ONDANSETRON HCL 4 MG/2ML IJ SOLN
4.0000 mg | Freq: Four times a day (QID) | INTRAMUSCULAR | Status: DC | PRN
  Administered 2013-04-04 – 2013-04-06 (×3): 4 mg via INTRAVENOUS
  Filled 2013-04-04 (×3): qty 2

## 2013-04-04 MED ORDER — PHENOL 1.4 % MT LIQD: 1.0000 | OROMUCOSAL | Status: DC | PRN

## 2013-04-04 MED ORDER — HYDROCODONE-ACETAMINOPHEN 5-325 MG PO TABS
1.0000 | ORAL_TABLET | ORAL | Status: DC | PRN
  Administered 2013-04-04: 1 via ORAL
  Administered 2013-04-05: 2 via ORAL
  Filled 2013-04-04: qty 2
  Filled 2013-04-04: qty 1

## 2013-04-04 MED ORDER — LACTATED RINGERS IV SOLN
INTRAVENOUS | Status: DC
  Administered 2013-04-04 (×2): via INTRAVENOUS

## 2013-04-04 MED ORDER — GLYCOPYRROLATE 0.2 MG/ML IJ SOLN
INTRAMUSCULAR | Status: AC
  Filled 2013-04-04: qty 1

## 2013-04-04 MED ORDER — OXYCODONE-ACETAMINOPHEN 5-325 MG PO TABS
2.0000 | ORAL_TABLET | ORAL | Status: DC | PRN
  Administered 2013-04-05 (×2): 2 via ORAL
  Administered 2013-04-06 (×2): 1 via ORAL
  Filled 2013-04-04 (×5): qty 2

## 2013-04-04 MED ORDER — POLYETHYLENE GLYCOL 3350 17 G PO PACK: 17.0000 g | PACK | Freq: Every day | ORAL | Status: DC | PRN

## 2013-04-04 MED ORDER — MONTELUKAST SODIUM 10 MG PO TABS
10.0000 mg | ORAL_TABLET | Freq: Every day | ORAL | Status: DC
  Administered 2013-04-04 – 2013-04-06 (×3): 10 mg via ORAL
  Filled 2013-04-04 (×4): qty 1

## 2013-04-04 MED ORDER — ONDANSETRON HCL 4 MG PO TABS
4.0000 mg | ORAL_TABLET | Freq: Four times a day (QID) | ORAL | Status: DC | PRN
  Administered 2013-04-05: 4 mg via ORAL
  Filled 2013-04-04: qty 1

## 2013-04-04 MED ORDER — MENTHOL 3 MG MT LOZG: 1.0000 | LOZENGE | OROMUCOSAL | Status: DC | PRN

## 2013-04-04 MED ORDER — FLUTICASONE PROPIONATE HFA 110 MCG/ACT IN AERO
1.0000 | INHALATION_SPRAY | Freq: Every morning | RESPIRATORY_TRACT | Status: DC
  Administered 2013-04-05 – 2013-04-07 (×3): 1 via RESPIRATORY_TRACT
  Filled 2013-04-04: qty 12

## 2013-04-04 MED ORDER — CEFAZOLIN SODIUM-DEXTROSE 2-3 GM-% IV SOLR
2.0000 g | INTRAVENOUS | Status: AC
  Administered 2013-04-04: 2 g via INTRAVENOUS

## 2013-04-04 SURGICAL SUPPLY — 66 items
ADH SKN CLS APL DERMABOND .7 (GAUZE/BANDAGES/DRESSINGS) ×1
BAG SPEC THK2 15X12 ZIP CLS (MISCELLANEOUS) ×1
BAG ZIPLOCK 12X15 (MISCELLANEOUS) ×2 IMPLANT
BANDAGE ELASTIC 4 VELCRO ST LF (GAUZE/BANDAGES/DRESSINGS) ×2 IMPLANT
BANDAGE ELASTIC 6 VELCRO ST LF (GAUZE/BANDAGES/DRESSINGS) ×2 IMPLANT
BANDAGE ESMARK 6X9 LF (GAUZE/BANDAGES/DRESSINGS) ×1 IMPLANT
BLADE SAG 18X100X1.27 (BLADE) ×2 IMPLANT
BLADE SAW SGTL 11.0X1.19X90.0M (BLADE) ×2 IMPLANT
BNDG CMPR 9X6 STRL LF SNTH (GAUZE/BANDAGES/DRESSINGS) ×1
BNDG ESMARK 6X9 LF (GAUZE/BANDAGES/DRESSINGS) ×2
BONE CEMENT GENTAMICIN (Cement) ×4 IMPLANT
CAPT RP KNEE ×1 IMPLANT
CEMENT BONE GENTAMICIN 40 (Cement) ×2 IMPLANT
CUFF TOURN SGL QUICK 34 (TOURNIQUET CUFF) ×2
CUFF TRNQT CYL 34X4X40X1 (TOURNIQUET CUFF) ×1 IMPLANT
DERMABOND ADVANCED (GAUZE/BANDAGES/DRESSINGS) ×1
DERMABOND ADVANCED .7 DNX12 (GAUZE/BANDAGES/DRESSINGS) IMPLANT
DRAPE EXTREMITY T 121X128X90 (DRAPE) ×2 IMPLANT
DRAPE INCISE IOBAN 66X45 STRL (DRAPES) ×2 IMPLANT
DRAPE LG THREE QUARTER DISP (DRAPES) ×2 IMPLANT
DRAPE POUCH INSTRU U-SHP 10X18 (DRAPES) ×2 IMPLANT
DRAPE U-SHAPE 47X51 STRL (DRAPES) ×2 IMPLANT
DRSG AQUACEL AG ADV 3.5X10 (GAUZE/BANDAGES/DRESSINGS) ×1 IMPLANT
DRSG TEGADERM 4X4.75 (GAUZE/BANDAGES/DRESSINGS) ×1 IMPLANT
DURAPREP 26ML APPLICATOR (WOUND CARE) ×2 IMPLANT
ELECT BLADE TIP CTD 4 INCH (ELECTRODE) ×1 IMPLANT
ELECT REM PT RETURN 9FT ADLT (ELECTROSURGICAL) ×2
ELECTRODE REM PT RTRN 9FT ADLT (ELECTROSURGICAL) ×1 IMPLANT
EVACUATOR 1/8 PVC DRAIN (DRAIN) ×2 IMPLANT
FACESHIELD LNG OPTICON STERILE (SAFETY) ×11 IMPLANT
GAUZE SPONGE 2X2 8PLY STRL LF (GAUZE/BANDAGES/DRESSINGS) IMPLANT
GLOVE BIOGEL PI IND STRL 8 (GLOVE) ×1 IMPLANT
GLOVE BIOGEL PI INDICATOR 8 (GLOVE) ×1
GLOVE ECLIPSE 8.0 STRL XLNG CF (GLOVE) ×4 IMPLANT
GLOVE SURG SS PI 6.5 STRL IVOR (GLOVE) ×4 IMPLANT
GOWN STRL REUS W/TWL LRG LVL3 (GOWN DISPOSABLE) ×2 IMPLANT
GOWN STRL REUS W/TWL XL LVL3 (GOWN DISPOSABLE) ×2 IMPLANT
HANDPIECE INTERPULSE COAX TIP (DISPOSABLE) ×2
IMMOBILIZER KNEE 20 (SOFTGOODS) ×2 IMPLANT
KIT BASIN OR (CUSTOM PROCEDURE TRAY) ×2 IMPLANT
MANIFOLD NEPTUNE II (INSTRUMENTS) ×2 IMPLANT
NEEDLE HYPO 22GX1.5 SAFETY (NEEDLE) ×2 IMPLANT
NS IRRIG 1000ML POUR BTL (IV SOLUTION) IMPLANT
PACK TOTAL JOINT (CUSTOM PROCEDURE TRAY) ×2 IMPLANT
PAD ABD 8X10 STRL (GAUZE/BANDAGES/DRESSINGS) IMPLANT
PADDING CAST COTTON 6X4 STRL (CAST SUPPLIES) IMPLANT
POSITIONER SURGICAL ARM (MISCELLANEOUS) ×2 IMPLANT
SET HNDPC FAN SPRY TIP SCT (DISPOSABLE) ×1 IMPLANT
SPONGE GAUZE 2X2 STER 10/PKG (GAUZE/BANDAGES/DRESSINGS) ×1
SPONGE LAP 18X18 X RAY DECT (DISPOSABLE) IMPLANT
SPONGE SURGIFOAM ABS GEL 100 (HEMOSTASIS) ×2 IMPLANT
STAPLER VISISTAT 35W (STAPLE) IMPLANT
SUCTION FRAZIER 12FR DISP (SUCTIONS) ×2 IMPLANT
SUT BONE WAX W31G (SUTURE) ×2 IMPLANT
SUT MNCRL AB 4-0 PS2 18 (SUTURE) ×2 IMPLANT
SUT VIC AB 1 CT1 27 (SUTURE) ×4
SUT VIC AB 1 CT1 27XBRD ANTBC (SUTURE) ×2 IMPLANT
SUT VIC AB 2-0 CT1 27 (SUTURE) ×8
SUT VIC AB 2-0 CT1 TAPERPNT 27 (SUTURE) ×2 IMPLANT
SUT VLOC 180 0 24IN GS25 (SUTURE) ×2 IMPLANT
SYR 20CC LL (SYRINGE) ×2 IMPLANT
TOWEL OR 17X26 10 PK STRL BLUE (TOWEL DISPOSABLE) ×4 IMPLANT
TOWER CARTRIDGE SMART MIX (DISPOSABLE) ×2 IMPLANT
TRAY FOLEY CATH 14FRSI W/METER (CATHETERS) ×2 IMPLANT
WATER STERILE IRR 1500ML POUR (IV SOLUTION) ×4 IMPLANT
WRAP KNEE MAXI GEL POST OP (GAUZE/BANDAGES/DRESSINGS) ×2 IMPLANT

## 2013-04-04 NOTE — Preoperative (Signed)
Beta Blockers   Reason not to administer Beta Blockers:Not Applicable 

## 2013-04-04 NOTE — Plan of Care (Signed)
Problem: Consults Goal: Diagnosis- Total Joint Replacement Left total knee     

## 2013-04-04 NOTE — Anesthesia Postprocedure Evaluation (Signed)
  Anesthesia Post-op Note  Patient: [name]  Procedure(s) Performed: Procedure(s) (LRB): LEFT TOTAL KNEE ARTHROPLASTY (Left)  Patient Location: PACU  Anesthesia Type: General  Level of Consciousness: awake and alert   Airway and Oxygen Therapy: Patient Spontanous Breathing  Post-op Pain: mild  Post-op Assessment: Post-op Vital signs reviewed, Patient's Cardiovascular Status Stable, Respiratory Function Stable, Patent Airway and No signs of Nausea or vomiting  Last Vitals:  Filed Vitals:   04/04/13 1845  BP: 129/67  Pulse: 67  Temp: 36.3 C  Resp: 16    Post-op Vital Signs: stable   Complications: No apparent anesthesia complications

## 2013-04-04 NOTE — Brief Op Note (Signed)
04/04/2013  3:17 PM  PATIENT:  [name]  80 y.o. female  PRE-OPERATIVE DIAGNOSIS:  osteoathritis of the left knee with Bone on Bone.  POST-OPERATIVE DIAGNOSIS:  osteoathritis of the left knee with Bone on Bone  PROCEDURE:  Procedure(s): LEFT TOTAL KNEE ARTHROPLASTY (Left)  SURGEON:  Surgeon(s) and Role:    * Tobi Bastos, MD - Primary  PHYSICIAN ASSISTANT:Amber Kevin PA   ASSISTANTS: Ardeen Jourdain PA  ANESTHESIA:   general  EBL:  Total I/O In: -  Out: 350 [Urine:350]  BLOOD ADMINISTERED:none  DRAINS: (One) Hemovact drain(s) in the Left Knee with  Suction Open   LOCAL MEDICATIONS USED:  BUPIVICAINE 20cc with 20cc of Normal Saline.  SPECIMEN:  No Specimen  DISPOSITION OF SPECIMEN:  N/A  COUNTS:  YES  TOURNIQUET:  * Missing tourniquet times found for documented tourniquets in log:  [id number] *  DICTATION: .Other Dictation: Dictation Number [id number]  PLAN OF CARE: Admit to inpatient   PATIENT DISPOSITION:  Stable in OR   Delay start of Pharmacological VTE agent (>24hrs) due to surgical blood loss or risk of bleeding: yes

## 2013-04-04 NOTE — Transfer of Care (Signed)
Immediate Anesthesia Transfer of Care Note  Patient: [name]  Procedure(s) Performed: Procedure(s): LEFT TOTAL KNEE ARTHROPLASTY (Left)  Patient Location: PACU  Anesthesia Type:General  Level of Consciousness: awake, alert , oriented and patient cooperative  Airway & Oxygen Therapy: Patient Spontanous Breathing and Patient connected to face mask oxygen  Post-op Assessment: Report given to PACU RN, Post -op Vital signs reviewed and stable and Patient moving all extremities X 4  Post vital signs: stable  Complications: No apparent anesthesia complications

## 2013-04-04 NOTE — Interval H&P Note (Signed)
History and Physical Interval Note:  04/04/2013 12:56 PM  [name]  has presented today for surgery, with the diagnosis of osteoathritis of the left knee  The various methods of treatment have been discussed with the patient and family. After consideration of risks, benefits and other options for treatment, the patient has consented to  Procedure(s): LEFT TOTAL KNEE ARTHROPLASTY (Left) as a surgical intervention .  The patient's history has been reviewed, patient examined, no change in status, stable for surgery.  I have reviewed the patient's chart and labs.  Questions were answered to the patient's satisfaction.     Shelvy Heckert A

## 2013-04-04 NOTE — Anesthesia Preprocedure Evaluation (Signed)
Anesthesia Evaluation  Patient identified by MRN, date of birth, ID band Patient awake    Reviewed: Allergy & Precautions, H&P , NPO status , Patient's Chart, lab work & pertinent test results  Airway Mallampati: II TM Distance: >3 FB Neck ROM: Full    Dental no notable dental hx.    Pulmonary shortness of breath, asthma , pneumonia -, former smoker,  breath sounds clear to auscultation  Pulmonary exam normal       Cardiovascular Exercise Tolerance: Good hypertension, Pt. on medications + angina + dysrhythmias Atrial Fibrillation Rhythm:Regular Rate:Normal  Recent Cardiology note and ECHO reviewed.   Neuro/Psych  Headaches, PSYCHIATRIC DISORDERS Depression    GI/Hepatic Neg liver ROS, GERD-  ,  Endo/Other  negative endocrine ROS  Renal/GU negative Renal ROS  negative genitourinary   Musculoskeletal negative musculoskeletal ROS (+)   Abdominal (+) + obese,   Peds negative pediatric ROS (+)  Hematology  (+) anemia ,   Anesthesia Other Findings   Reproductive/Obstetrics negative OB ROS                           Anesthesia Physical Anesthesia Plan  ASA: III  Anesthesia Plan: General   Post-op Pain Management:    Induction: Intravenous  Airway Management Planned: Oral ETT  Additional Equipment:   Intra-op Plan:   Post-operative Plan: Extubation in OR  Informed Consent: I have reviewed the patients History and Physical, chart, labs and discussed the procedure including the risks, benefits and alternatives for the proposed anesthesia with the patient or authorized representative who has indicated his/her understanding and acceptance.   Dental advisory given  Plan Discussed with: CRNA  Anesthesia Plan Comments: (Discussed r/b general versus spinal. Patient prefers general and says her daughter also wanted her to have a general.)        Anesthesia Quick Evaluation

## 2013-04-05 DIAGNOSIS — D62 Acute posthemorrhagic anemia: Secondary | ICD-10-CM | POA: Diagnosis not present

## 2013-04-05 LAB — BASIC METABOLIC PANEL
BUN: 23 mg/dL (ref 6–23)
CALCIUM: 9 mg/dL (ref 8.4–10.5)
CO2: 22 mEq/L (ref 19–32)
Chloride: 97 mEq/L (ref 96–112)
Creatinine, Ser: 1.02 mg/dL (ref 0.50–1.10)
GFR, EST AFRICAN AMERICAN: 57 mL/min — AB (ref >90)
GFR, EST NON AFRICAN AMERICAN: 49 mL/min — AB (ref >90)
Glucose, Bld: 177 mg/dL — ABNORMAL HIGH (ref 70–99)
Potassium: 4.4 mEq/L (ref 3.7–5.3)
SODIUM: 134 meq/L — AB (ref 137–147)

## 2013-04-05 LAB — CBC
HEMATOCRIT: 28.3 % — AB (ref 36.0–46.0)
HEMOGLOBIN: 9.4 g/dL — AB (ref 12.0–15.0)
MCH: 30.6 pg (ref 26.0–34.0)
MCHC: 33.2 g/dL (ref 30.0–36.0)
MCV: 92.2 fL (ref 78.0–100.0)
Platelets: 164 10*3/uL (ref 150–400)
RBC: 3.07 MIL/uL — ABNORMAL LOW (ref 3.87–5.11)
RDW: 13 % (ref 11.5–15.5)
WBC: 11.8 10*3/uL — ABNORMAL HIGH (ref 4.0–10.5)

## 2013-04-05 MED ORDER — OXYCODONE-ACETAMINOPHEN 5-325 MG PO TABS: 1.0000 | ORAL_TABLET | ORAL | Status: DC | PRN

## 2013-04-05 MED ORDER — RIVAROXABAN 10 MG PO TABS: 10.0000 mg | ORAL_TABLET | Freq: Every day | ORAL | Status: DC

## 2013-04-05 MED ORDER — POLYETHYLENE GLYCOL 3350 17 G PO PACK: 17.0000 g | PACK | Freq: Every day | ORAL | Status: DC | PRN

## 2013-04-05 MED ORDER — METHOCARBAMOL 500 MG PO TABS: 500.0000 mg | ORAL_TABLET | Freq: Four times a day (QID) | ORAL | Status: DC | PRN

## 2013-04-05 NOTE — Progress Notes (Signed)
Clinical Social Work Department BRIEF PSYCHOSOCIAL ASSESSMENT 04/05/2013  Patient:  MILLETTE, HALBERSTAM     Account Number:  [id number]     Admit date:  04/04/2013  Clinical Social Worker:  Lacie Scotts  Date/Time:  04/05/2013 02:12 PM  Referred by:  Physician  Date Referred:  04/05/2013 Referred for  SNF Placement   Other Referral:   Interview type:  Patient Other interview type:    PSYCHOSOCIAL DATA Living Status:  ALONE Admitted from facility:   Level of care:   Primary support name:  Salli Quarry Primary support relationship to patient:  FAMILY Degree of support available:    CURRENT CONCERNS Current Concerns  Post-Acute Placement   Other Concerns:    SOCIAL WORK ASSESSMENT / PLAN Pt is an 80 yr old female living at home prior to hospitalization. CSW met with pt to assist with d/c planning. Pt has made prior arrangements to have ST Rehab at [facility] following hospital d/c. CSW has contacted SNF and d/c plans have been confirmed. CSW will continue to follow to assist with d/c planning to SNF.   Assessment/plan status:  Psychosocial Support/Ongoing Assessment of Needs Other assessment/ plan:   Information/referral to community resources:   Insurance coverage for SNF and Ambulance transport reviewed.    PATIENT'S/FAMILY'S RESPONSE TO PLAN OF CARE: Pt's mood is bright. She is happy her surgery is over. Pt is looking forward to recovering at [facility].   Werner Lean LCSW [phone]

## 2013-04-05 NOTE — Progress Notes (Signed)
   CARE MANAGEMENT NOTE 04/05/2013  Patient:  ANUHEA, RYBA   Account Number:  [id number]  Date Initiated:  04/05/2013  Documentation initiated by:  [facility]  Subjective/Objective Assessment:   LEFT TOTAL KNEE ARTHROPLASTY     Action/Plan:   SNF   Anticipated DC Date:  04/07/2013   Anticipated DC Plan:  [place]  In-house referral  Clinical Social Worker      DC Planning Services  CM consult      Choice offered to / List presented to:             Status of service:  Completed, signed off Medicare Important Message given?   (If response is "NO", the following Medicare IM given date fields will be blank) Date Medicare IM given:   Date Additional Medicare IM given:    Discharge Disposition:  [place]  Per UR Regulation:    If discussed at Long Length of Stay Meetings, dates discussed:    Comments:  No NCM needs identified. Jonnie Finner RN CCM Case Mgmt phone [phone]

## 2013-04-05 NOTE — Progress Notes (Signed)
Utilization review completed.  

## 2013-04-05 NOTE — Progress Notes (Signed)
Physical Therapy Treatment Patient Details Name: [name] MRN: [id number] DOB: [birth year] Today's Date: 04/05/2013 Time: [id number] PT Time Calculation (min): 19 min  PT Assessment / Plan / Recommendation  History of Present Illness     PT Comments     Follow Up Recommendations  SNF     Does the patient have the potential to tolerate intense rehabilitation     Barriers to Discharge        Equipment Recommendations  None recommended by PT    Recommendations for Other Services OT consult  Frequency 7X/week   Progress towards PT Goals Progress towards PT goals: Progressing toward goals  Plan Current plan remains appropriate    Precautions / Restrictions Precautions Precautions: Knee;Fall Required Braces or Orthoses: Knee Immobilizer - Left Knee Immobilizer - Left: Discontinue once straight leg raise with < 10 degree lag Restrictions Weight Bearing Restrictions: No Other Position/Activity Restrictions: WBAT   Pertinent Vitals/Pain 8/10; MEDs requested; ice packs provided    Mobility  Bed Mobility Overal bed mobility: +2 for physical assistance;Needs Assistance Bed Mobility: Sit to Supine Supine to sit: +2 for physical assistance;Mod assist Sit to supine: +2 for physical assistance;Mod assist General bed mobility comments: cues for sequence and use of UEs to self assist Transfers Overall transfer level: Needs assistance Equipment used: Rolling walker (2 wheeled) Transfers: Sit to/from Stand Sit to Stand: Mod assist;+2 physical assistance General transfer comment: cues for LE management and use of UEs to self assist Ambulation/Gait Ambulation/Gait assistance: +2 physical assistance;Mod assist Ambulation Distance (Feet): 22 Feet Assistive device: Rolling walker (2 wheeled) Gait Pattern/deviations: Step-to pattern;Decreased step length - right;Decreased step length - left;Shuffle;Antalgic;Trunk flexed Gait velocity: decr General Gait Details: Cues for sequence  posture and position from RW.  Physical assist for balance and support with buckling bil LEs     Exercises Total Joint Exercises Ankle Circles/Pumps: AROM;10 reps;Supine;Both Quad Sets: AROM;Both;10 reps;Supine Heel Slides: AAROM;Left;10 reps;Supine Straight Leg Raises: AAROM;Left;10 reps;Supine   PT Diagnosis: Difficulty walking  PT Problem List: Decreased strength;Decreased range of motion;Decreased activity tolerance;Decreased mobility;Decreased knowledge of use of DME;Pain PT Treatment Interventions: DME instruction;Gait training;Functional mobility training;Therapeutic activities;Therapeutic exercise;Patient/family education   PT Goals (current goals can now be found in the care plan section) Acute Rehab PT Goals Patient Stated Goal: Resume previous lifestyle with decreased pain and increased LE stability PT Goal Formulation: With patient Time For Goal Achievement: 04/12/13 Potential to Achieve Goals: Good  Visit Information  Last PT Received On: 04/05/13 Assistance Needed: +2    Subjective Data  Subjective: I'd like to lay down Patient Stated Goal: Resume previous lifestyle with decreased pain and increased LE stability   Cognition  Cognition Arousal/Alertness: Awake/alert Behavior During Therapy: WFL for tasks assessed/performed Overall Cognitive Status: Within Functional Limits for tasks assessed Memory: Decreased recall of precautions    Balance     End of Session PT - End of Session Equipment Utilized During Treatment: Gait belt;Left knee immobilizer Activity Tolerance: Patient tolerated treatment well Patient left: in bed;with call bell/phone within reach;with family/visitor present Nurse Communication: Mobility status   GP     [name] 04/05/2013, 2:52 PM

## 2013-04-05 NOTE — Discharge Instructions (Addendum)
Walk with your walker. Weight bearing as tolerated. Miranda will follow you at home for your therapy Do not change your dressing unless there is excess drainage Discontinue use of vitamins and supplements until completion of three week course of Xarelto Shower only, no tub bath. Call if any temperatures greater than 101 or any wound complications: [phone] during the day and ask for Dr. Charlestine Night nurse, Brunilda Payor.    Information on my medicine - XARELTO (Rivaroxaban)  This medication education was reviewed with me or my healthcare representative as part of my discharge preparation.  The pharmacist that spoke with me during my hospital stay was:  Absher, Julieta Bellini, RPH  Why was Xarelto prescribed for you? Xarelto was prescribed for you to reduce the risk of blood clots forming after orthopedic surgery. The medical term for these abnormal blood clots is venous thromboembolism (VTE).  What do you need to know about xarelto ? Take your Xarelto ONCE DAILY at the same time every day. You may take it either with or without food.  If you have difficulty swallowing the tablet whole, you may crush it and mix in applesauce just prior to taking your dose.  Take Xarelto exactly as prescribed by your doctor and DO NOT stop taking Xarelto without talking to the doctor who prescribed the medication.  Stopping without other VTE prevention medication to take the place of Xarelto may increase your risk of developing a clot.  After discharge, you should have regular check-up appointments with your healthcare provider that is prescribing your Xarelto.    What do you do if you miss a dose? If you miss a dose, take it as soon as you remember on the same day then continue your regularly scheduled once daily regimen the next day. Do not take two doses of Xarelto on the same day.   Important Safety Information A possible side effect of Xarelto is bleeding. You should call your  healthcare provider right away if you experience any of the following:   Bleeding from an injury or your nose that does not stop.   Unusual colored urine (red or dark brown) or unusual colored stools (red or black).   Unusual bruising for unknown reasons.   A serious fall or if you hit your head (even if there is no bleeding).  Some medicines may interact with Xarelto and might increase your risk of bleeding while on Xarelto. To help avoid this, consult your healthcare provider or pharmacist prior to using any new prescription or non-prescription medications, including herbals, vitamins, non-steroidal anti-inflammatory drugs (NSAIDs) and supplements.  This website has more information on Xarelto: [email or web address]

## 2013-04-05 NOTE — Progress Notes (Signed)
OT Cancellation Note  Patient Details Name: [name] MRN: [id number] DOB: Jul 29, 1927   Cancelled Treatment:    Reason Eval/Treat Not Completed: Other (comment).  Pt plans snf; OT eval likely not needed for admission.  Will defer to that venue.    Rumi Kolodziej 04/05/2013, 11:49 AM Lesle Chris, OTR/L [phone] 04/05/2013

## 2013-04-05 NOTE — Evaluation (Signed)
Physical Therapy Evaluation Patient Details Name: [name] MRN: [id number] DOB: Jun 11, 1927 Today's Date: 04/05/2013 Time: [id number] PT Time Calculation (min): 28 min  PT Assessment / Plan / Recommendation History of Present Illness     Clinical Impression  Pt s/p L TKR  Presents with decreased L LE strength/ROM and post op pain limiting functional mobility.  Pt would benefit from follow up rehab at SNF level to maximize IND and safety prior to return home with ltd assist    PT Assessment  Patient needs continued PT services    Follow Up Recommendations  SNF    Does the patient have the potential to tolerate intense rehabilitation      Barriers to Discharge        Equipment Recommendations  None recommended by PT    Recommendations for Other Services OT consult   Frequency 7X/week    Precautions / Restrictions Precautions Precautions: Knee;Fall Required Braces or Orthoses: Knee Immobilizer - Left Knee Immobilizer - Left: Discontinue once straight leg raise with < 10 degree lag Restrictions Weight Bearing Restrictions: No Other Position/Activity Restrictions: WBAT   Pertinent Vitals/Pain 5/10; premed, cold packs provided      Mobility  Bed Mobility Overal bed mobility: +2 for physical assistance;Needs Assistance Bed Mobility: Supine to Sit Supine to sit: +2 for physical assistance;Mod assist General bed mobility comments: cues for sequence and use of UEs to self assist Transfers Overall transfer level: Needs assistance Equipment used: Rolling walker (2 wheeled) Transfers: Sit to/from Stand Sit to Stand: Mod assist;+2 physical assistance General transfer comment: cues for LE management and use of UEs to self assist Ambulation/Gait Ambulation/Gait assistance: +2 physical assistance;Mod assist Ambulation Distance (Feet): 5 Feet Assistive device: Rolling walker (2 wheeled) Gait Pattern/deviations: Step-to pattern;Decreased step length - right;Decreased step  length - left;Shuffle;Antalgic;Trunk flexed Gait velocity: decr General Gait Details: Cues for sequence posture and position from RW.  Physical assist for balance and support with buckling bil LEs     Exercises Total Joint Exercises Ankle Circles/Pumps: AROM;10 reps;Supine;Both Quad Sets: AROM;Both;10 reps;Supine Heel Slides: AAROM;Left;10 reps;Supine Straight Leg Raises: AAROM;Left;10 reps;Supine   PT Diagnosis: Difficulty walking  PT Problem List: Decreased strength;Decreased range of motion;Decreased activity tolerance;Decreased mobility;Decreased knowledge of use of DME;Pain PT Treatment Interventions: DME instruction;Gait training;Functional mobility training;Therapeutic activities;Therapeutic exercise;Patient/family education     PT Goals(Current goals can be found in the care plan section) Acute Rehab PT Goals Patient Stated Goal: Resume previous lifestyle with decreased pain and increased LE stability PT Goal Formulation: With patient Time For Goal Achievement: 04/12/13 Potential to Achieve Goals: Good  Visit Information  Last PT Received On: 04/05/13 Assistance Needed: +2       Prior Functioning  Home Living Family/patient expects to be discharged to:: Skilled nursing facility Living Arrangements: Children Prior Function Level of Independence: Independent with assistive device(s) Comments: Admits to hx of falls Communication Communication: No difficulties Dominant Hand: Right    Cognition  Cognition Arousal/Alertness: Awake/alert Behavior During Therapy: WFL for tasks assessed/performed Overall Cognitive Status: Within Functional Limits for tasks assessed Memory: Decreased recall of precautions    Extremity/Trunk Assessment Upper Extremity Assessment Upper Extremity Assessment: Overall WFL for tasks assessed Lower Extremity Assessment Lower Extremity Assessment: LLE deficits/detail LLE Deficits / Details: 2/5 quads with AAROM at knee -10 - 40   Balance     End of Session PT - End of Session Equipment Utilized During Treatment: Gait belt;Left knee immobilizer Activity Tolerance: Patient tolerated treatment well Patient left: in chair;with call  bell/phone within reach Nurse Communication: Mobility status  GP     Jaimie Redditt 04/05/2013, 1:36 PM

## 2013-04-05 NOTE — Progress Notes (Signed)
Subjective: 1 Day Post-Op Procedure(s) (LRB): LEFT TOTAL KNEE ARTHROPLASTY (Left) Patient reports pain as 3 on 0-10 scale. Hemovac DCd. No post-Op problems.   Objective: Vital signs in last 24 hours: Temp:  [97.4 F (36.3 C)-98.3 F (36.8 C)] 97.8 F (36.6 C) (02/26 1000) Pulse Rate:  [67-84] 83 (02/26 1000) Resp:  [9-18] 18 (02/26 1000) BP: (107-149)/(57-76) 128/67 mmHg (02/26 1000) SpO2:  [91 %-99 %] 95 % (02/26 1000) Weight:  [77.565 kg (171 lb)] 77.565 kg (171 lb) (02/25 1645)  Intake/Output from previous day: 02/25 0701 - 02/26 0700 In: 2990 [P.O.:1440; I.V.:1550] Out: 1035 [Urine:700; Drains:335] Intake/Output this shift: Total I/O In: 240 [P.O.:240] Out: 125 [Urine:125]   Recent Labs  04/05/13 0330  HGB 9.4*    Recent Labs  04/05/13 0330  WBC 11.8*  RBC 3.07*  HCT 28.3*  PLT 164    Recent Labs  04/05/13 0330  NA 134*  K 4.4  CL 97  CO2 22  BUN 23  CREATININE 1.02  GLUCOSE 177*  CALCIUM 9.0   No results found for this basename: LABPT, INR,  in the last 72 hours  Dorsiflexion/Plantar flexion intact  Assessment/Plan: 1 Day Post-Op Procedure(s) (LRB): LEFT TOTAL KNEE ARTHROPLASTY (Left) Up with therapy.  Heran Campau A 04/05/2013, 11:46 AM

## 2013-04-05 NOTE — Op Note (Signed)
[name], [name]              ACCOUNT NO.:  [id number]  MEDICAL RECORD NO.:  [id number]  LOCATION:  [place]                         FACILITY:  [facility]  PHYSICIAN:  Kipp Brood. Shontay Wallner, M.D.DATE OF BIRTH:  [birth year]  DATE OF PROCEDURE:  04/04/2013 DATE OF DISCHARGE:                              OPERATIVE REPORT   SURGEON:  Kipp Brood. Jizel Cheeks, M.D.  ASSISTANT:  Ardeen Jourdain, [place]  PREOPERATIVE DIAGNOSIS:  Severe degenerative arthritis with slight flexion contracture and bone-on-bone, left knee.  POSTOPERATIVE DIAGNOSIS:  Severe degenerative arthritis with slight flexion contracture and bone-on-bone, left knee.  We have exhausted all forms of conservative care and she elected to go on to have a total knee replacement.  OPERATION:  Left total knee arthroplasty utilizing DePuy system, all 3 components were cemented.  Gentamicin was used in the cement.  Used the patella was a size 38 with 3 pegs for resurfacing procedure.  The femur was a size 4, left posterior cruciate sacrificing the size 4 narrow. The tray was a size 3 with a size 4, 10 mm thickness rotating platform.  DESCRIPTION OF PROCEDURE:  Under general anesthesia, routine orthopedic prepping and draping in the left lower extremity was carried out. Appropriate time-out was first carried out.  I also marked the appropriate left leg in the holding area.  The leg was exsanguinated and Esmarch tourniquet was elevated at 325 mmHg.  The knee was flexed.  An anterior incision was carried out.  Two flaps were created.  I then carried out a median parapatellar incision, reflected the patella laterally and flexed the knee.  I then removed all the major osteophytes.  I then did excision of the medial and lateral menisci, and anterior and posterior cruciate ligaments.  Note, the knee was extremely tight.  She also had severe bone-on-bone arthritis.  Initial drill hole was made in the intercondylar notch.  Following that, the canal  finder was inserted.  I irrigated the canal, #1 jig was inserted.  I removed 30 mm of the distal femur at this time.  Next, the femur was measured to be a size 4.  I then cut the femur for a size 4, did anterior, posterior, and chamfering cuts for a size 4 left.  At this time, attention was directed to the tibia.  Drill hole was made in the tibial plateau.  I then removed 8 mm thickness off the affected side.  Note, it had a large amount of anterior spurring.  There was collapse of the joint space as well.  Following that, I then inserted the lamina spreaders and removed all the posterior spurs and completed my meniscectomies.  We then inserted our spacer guide.  We had excellent fit with the 10 mm thickness and flexion and extension.  Following that, I then went on, prepared the tibia with the keel cut.  I then did my notch cut out of the distal femur.  The trial components were inserted and the knee was extended.  I then did a resurfacing procedure on the patella for a size 38 patella.  Three drill holes were made in the patella surface. Following that, we went through the trial with the patellar button.  We had an excellent fit.  I then removed all trial components, and then thoroughly water picked out the knee and cemented all 3 components in simultaneously.  All loose pieces of cement were removed.  I water picked out the knee to make sure no small pieces of cement were left behind.  I then injected half with a mixture of 20 mL of Exparel with normal saline into the soft tissue.  I inserted some thrombin-soaked Gelfoam posteriorly and then inserted my permanent rotating platform reduced the knee and had excellent function.  I then inserted a Hemovac drain and closed the remaining part of the wound in layers in usual fashion.  Sterile dressings were applied.  Remaining part of the mixtures of Exparel and saline were injected at the end of the procedure.           ______________________________ Kipp Brood. Gladstone Lighter, M.D.     RAG/MEDQ  D:  04/04/2013  T:  04/05/2013  Job:  [id number]

## 2013-04-05 NOTE — Progress Notes (Signed)
Clinical Social Work Department CLINICAL SOCIAL WORK PLACEMENT NOTE 04/05/2013  Patient:  [name], [name]  Account Number:  [id number] Admit date:  04/04/2013  Clinical Social Worker:  Werner Lean, LCSW  Date/time:  04/05/2013 02:37 PM  Clinical Social Work is seeking post-discharge placement for this patient at the following level of care:   SKILLED NURSING   (*CSW will update this form in Epic as items are completed)     Patient/family provided with [place] Department of Clinical Social Work's list of facilities offering this level of care within the geographic area requested by the patient (or if unable, by the patient's family).  04/05/2013  Patient/family informed of their freedom to choose among providers that offer the needed level of care, that participate in Medicare, Medicaid or managed care program needed by the patient, have an available bed and are willing to accept the patient.    Patient/family informed of [facility]' ownership interest in [facility], as well as of the fact that they are under no obligation to receive care at this facility.  PASARR submitted to [employer] on  PASARR number received from [employer] on 07/13/2007  FL2 transmitted to all facilities in geographic area requested by pt/family on  04/05/2013 FL2 transmitted to all facilities within larger geographic area on   Patient informed that his/her managed care company has contracts with or will negotiate with  certain facilities, including the following:     Patient/family informed of bed offers received:  04/05/2013 Patient chooses bed at [facility] Physician recommends and patient chooses bed at    Patient to be transferred to [place] on   Patient to be transferred to facility by   The following physician request were entered in Epic:   Additional Comments:   Werner Lean LCSW [phone]

## 2013-04-06 ENCOUNTER — Encounter (HOSPITAL_COMMUNITY): Payer: Self-pay | Admitting: Orthopedic Surgery

## 2013-04-06 LAB — CBC
HCT: 22 % — ABNORMAL LOW (ref 36.0–46.0)
Hemoglobin: 7.7 g/dL — ABNORMAL LOW (ref 12.0–15.0)
MCH: 31.4 pg (ref 26.0–34.0)
MCHC: 35 g/dL (ref 30.0–36.0)
MCV: 89.8 fL (ref 78.0–100.0)
Platelets: 138 10*3/uL — ABNORMAL LOW (ref 150–400)
RBC: 2.45 MIL/uL — ABNORMAL LOW (ref 3.87–5.11)
RDW: 13.1 % (ref 11.5–15.5)
WBC: 8.8 10*3/uL (ref 4.0–10.5)

## 2013-04-06 LAB — BASIC METABOLIC PANEL
BUN: 20 mg/dL (ref 6–23)
CO2: 24 mEq/L (ref 19–32)
Calcium: 8.8 mg/dL (ref 8.4–10.5)
Chloride: 91 mEq/L — ABNORMAL LOW (ref 96–112)
Creatinine, Ser: 1 mg/dL (ref 0.50–1.10)
GFR calc Af Amer: 59 mL/min — ABNORMAL LOW (ref >90)
GFR, EST NON AFRICAN AMERICAN: 51 mL/min — AB (ref >90)
Glucose, Bld: 146 mg/dL — ABNORMAL HIGH (ref 70–99)
Potassium: 4.3 mEq/L (ref 3.7–5.3)
Sodium: 127 mEq/L — ABNORMAL LOW (ref 137–147)

## 2013-04-06 LAB — HEMOGLOBIN AND HEMATOCRIT, BLOOD
HCT: 21.7 % — ABNORMAL LOW (ref 36.0–46.0)
Hemoglobin: 7.5 g/dL — ABNORMAL LOW (ref 12.0–15.0)

## 2013-04-06 LAB — PREPARE RBC (CROSSMATCH)

## 2013-04-06 MED ORDER — TRAMADOL HCL 50 MG PO TABS
50.0000 mg | ORAL_TABLET | Freq: Four times a day (QID) | ORAL | Status: DC | PRN
  Administered 2013-04-06 – 2013-04-07 (×4): 50 mg via ORAL
  Filled 2013-04-06 (×4): qty 1

## 2013-04-06 MED ORDER — TRAMADOL HCL 50 MG PO TABS: 50.0000 mg | ORAL_TABLET | Freq: Four times a day (QID) | ORAL | Status: DC | PRN

## 2013-04-06 MED ORDER — OXYCODONE-ACETAMINOPHEN 5-325 MG PO TABS
1.0000 | ORAL_TABLET | ORAL | Status: DC | PRN
  Administered 2013-04-06: 1 via ORAL
  Filled 2013-04-06: qty 1

## 2013-04-06 MED ORDER — LACTATED RINGERS IV BOLUS (SEPSIS)
250.0000 mL | Freq: Once | INTRAVENOUS | Status: AC
  Administered 2013-04-06: 250 mL via INTRAVENOUS

## 2013-04-06 MED ORDER — HYDROCODONE-ACETAMINOPHEN 5-325 MG PO TABS: 1.0000 | ORAL_TABLET | ORAL | Status: DC | PRN

## 2013-04-06 NOTE — Progress Notes (Signed)
CSW assisting with d/c planning. D/C Summary sent to [facility] for possible admission SAT if pt is stable for d/c. Weekend CSW will assist with d/c planning.  Werner Lean LCSW [phone]

## 2013-04-06 NOTE — Progress Notes (Signed)
Subjective: 2 Days Post-Op Procedure(s) (LRB): LEFT TOTAL KNEE ARTHROPLASTY (Left) Patient reports pain as 3 on 0-10 scale.Doing well except for a little confusion. Hbg7.7. Will repeat this afternoon.    Objective: Vital signs in last 24 hours: Temp:  [97.7 F (36.5 C)-98.7 F (37.1 C)] 98.5 F (36.9 C) (02/27 0550) Pulse Rate:  [61-83] 69 (02/27 0550) Resp:  [14-18] 16 (02/27 0550) BP: (113-128)/(57-67) 113/62 mmHg (02/27 0550) SpO2:  [90 %-95 %] 90 % (02/27 0550)  Intake/Output from previous day: 02/26 0701 - 02/27 0700 In: 3073 [P.O.:840; I.V.:2233] Out: 750 [Urine:750] Intake/Output this shift:     Recent Labs  04/05/13 0330 04/06/13 0428  HGB 9.4* 7.7*    Recent Labs  04/05/13 0330 04/06/13 0428  WBC 11.8* 8.8  RBC 3.07* 2.45*  HCT 28.3* 22.0*  PLT 164 138*    Recent Labs  04/05/13 0330 04/06/13 0428  NA 134* 127*  K 4.4 4.3  CL 97 91*  CO2 22 24  BUN 23 20  CREATININE 1.02 1.00  GLUCOSE 177* 146*  CALCIUM 9.0 8.8   No results found for this basename: LABPT, INR,  in the last 72 hours  Dorsiflexion/Plantar flexion intact  Assessment/Plan: 2 Days Post-Op Procedure(s) (LRB): LEFT TOTAL KNEE ARTHROPLASTY (Left) Up with therapy skilled nursing facility tomorrow.  Lashawnta Burgert A 04/06/2013, 8:00 AM

## 2013-04-06 NOTE — Progress Notes (Signed)
Physical Therapy Treatment Patient Details Name: [name] MRN: [id number] DOB: 1927/10/06 Today's Date: 04/06/2013 Time: [id number] PT Time Calculation (min): 26 min  PT Assessment / Plan / Recommendation  History of Present Illness     PT Comments   Pt assisted chair to [facility] to bed.  Progress ltd by pt fatigue and c/o dizziness - RN aware  Follow Up Recommendations  SNF     Does the patient have the potential to tolerate intense rehabilitation     Barriers to Discharge        Equipment Recommendations  None recommended by PT    Recommendations for Other Services OT consult  Frequency 7X/week   Progress towards PT Goals Progress towards PT goals: Not progressing toward goals - comment (2* fatigue and dizziness in sitting)  Plan Current plan remains appropriate    Precautions / Restrictions Precautions Precautions: Knee;Fall Required Braces or Orthoses: Knee Immobilizer - Left Knee Immobilizer - Left: Discontinue once straight leg raise with < 10 degree lag Restrictions Weight Bearing Restrictions: No Other Position/Activity Restrictions: WBAT   Pertinent Vitals/Pain     Mobility  Bed Mobility Overal bed mobility: +2 for physical assistance;Needs Assistance Bed Mobility: Sit to Supine Sit to supine: +2 for physical assistance;Mod assist General bed mobility comments: cues for sequence and use of UEs to self assist Transfers Overall transfer level: Needs assistance Equipment used: Rolling walker (2 wheeled) Transfers: Sit to/from Stand Sit to Stand: Mod assist;+2 physical assistance General transfer comment: cues for LE management and use of UEs to self assist Ambulation/Gait Ambulation/Gait assistance: +2 physical assistance;Mod assist Ambulation Distance (Feet): 3 Feet (twice ) Assistive device: Rolling walker (2 wheeled) Gait Pattern/deviations: Step-to pattern;Decreased step length - right;Decreased step length - left;Shuffle;Antalgic;Trunk flexed Gait  velocity: decr General Gait Details: Cues for sequence posture and position from RW.  Physical assist for balance and support with buckling bil LEs     Exercises     PT Diagnosis:    PT Problem List:   PT Treatment Interventions:     PT Goals (current goals can now be found in the care plan section) Acute Rehab PT Goals Patient Stated Goal: Resume previous lifestyle with decreased pain and increased LE stability PT Goal Formulation: With patient Time For Goal Achievement: 04/12/13 Potential to Achieve Goals: Good  Visit Information  Last PT Received On: 04/06/13 Assistance Needed: +2    Subjective Data  Subjective: I'm dizzy just sitting here Patient Stated Goal: Resume previous lifestyle with decreased pain and increased LE stability   Cognition  Cognition Arousal/Alertness: Awake/alert Behavior During Therapy: WFL for tasks assessed/performed Overall Cognitive Status: Within Functional Limits for tasks assessed Memory: Decreased recall of precautions    Balance     End of Session PT - End of Session Equipment Utilized During Treatment: Gait belt;Left knee immobilizer Activity Tolerance: Patient limited by fatigue;Other (comment) (dizzy) Patient left: in bed;with call bell/phone within reach;with family/visitor present Nurse Communication: Mobility status;Other (comment) (DIZZY)   GP     Martiza Speth 04/06/2013, 12:55 PM

## 2013-04-06 NOTE — Discharge Summary (Signed)
Physician Discharge Summary   Patient ID: [name] MRN: [id number] DOB/AGE: 1927-05-31 80 y.o.  Admit date: 04/04/2013 Discharge date: 04/07/2013  Primary Diagnosis: Osteoarthritis, left knee   Admission Diagnoses:  Past Medical History  Diagnosis Date  . Atrial fibrillation   . Palpitations   . Unspecified essential hypertension   . Personal history of other diseases of digestive system   . Other abnormal glucose   . Odynophagia   . Anginal pain     pressure and flutters  . Hypercholesteremia     controlled  . Depression   . Shortness of breath     with activity  . Asthma     no problems recent  . Pneumonia     hx of  . GERD (gastroesophageal reflux disease)   . Arthritis   . Gout   . Cancer     skin R elbow  . Anemia   . Diverticulitis   . Headache(784.0)     not often  . DVT (deep venous thrombosis)     from birth control, left groin  . Cataract    Discharge Diagnoses:   Active Problems:   Osteoarthritis of left knee   Total knee replacement status   Acute blood loss anemia  Estimated body mass index is 32.33 kg/(m^2) as calculated from the following:   Height as of this encounter: 5' 1"  (1.549 m).   Weight as of this encounter: 77.565 kg (171 lb).  Procedure:  Procedure(s) (LRB): LEFT TOTAL KNEE ARTHROPLASTY (Left)   Consults: None  HPI: [name], 80 y.o. female, has a history of pain and functional disability in the left knee due to arthritis and has failed non-surgical conservative treatments for greater than 12 weeks to includeNSAID's and/or analgesics, corticosteriod injections, viscosupplementation injections, use of assistive devices and activity modification. Onset of symptoms was gradual, starting 5 years ago with gradually worsening course since that time. The patient noted no past surgery on the left knee(s). Patient currently rates pain in the left knee(s) at 7 out of 10 with activity. Patient has night pain, worsening of pain  with activity and weight bearing, pain that interferes with activities of daily living, pain with passive range of motion, crepitus and joint swelling. Patient has evidence of periarticular osteophytes and joint space narrowing by imaging studies. There is no active infection.   Laboratory Data: Admission on 04/04/2013  Component Date Value Ref Range Status  . WBC 04/05/2013 11.8* 4.0 - 10.5 K/uL Final  . RBC 04/05/2013 3.07* 3.87 - 5.11 MIL/uL Final  . Hemoglobin 04/05/2013 9.4* 12.0 - 15.0 g/dL Final  . HCT 04/05/2013 28.3* 36.0 - 46.0 % Final  . MCV 04/05/2013 92.2  78.0 - 100.0 fL Final  . MCH 04/05/2013 30.6  26.0 - 34.0 pg Final  . MCHC 04/05/2013 33.2  30.0 - 36.0 g/dL Final  . RDW 04/05/2013 13.0  11.5 - 15.5 % Final  . Platelets 04/05/2013 164  150 - 400 K/uL Final  . Sodium 04/05/2013 134* 137 - 147 mEq/L Final  . Potassium 04/05/2013 4.4  3.7 - 5.3 mEq/L Final  . Chloride 04/05/2013 97  96 - 112 mEq/L Final  . CO2 04/05/2013 22  19 - 32 mEq/L Final  . Glucose, Bld 04/05/2013 177* 70 - 99 mg/dL Final  . BUN 04/05/2013 23  6 - 23 mg/dL Final  . Creatinine, Ser 04/05/2013 1.02  0.50 - 1.10 mg/dL Final  . Calcium 04/05/2013 9.0  8.4 - 10.5 mg/dL  Final  . GFR calc non Af Amer 04/05/2013 49* >90 mL/min Final  . GFR calc Af Amer 04/05/2013 57* >90 mL/min Final   Comment: (NOTE)                          The eGFR has been calculated using the CKD EPI equation.                          This calculation has not been validated in all clinical situations.                          eGFR's persistently <90 mL/min signify possible Chronic Kidney                          Disease.  . WBC 04/06/2013 8.8  4.0 - 10.5 K/uL Final  . RBC 04/06/2013 2.45* 3.87 - 5.11 MIL/uL Final  . Hemoglobin 04/06/2013 7.7* 12.0 - 15.0 g/dL Final   Comment: DELTA CHECK NOTED                          REPEATED TO VERIFY  . HCT 04/06/2013 22.0* 36.0 - 46.0 % Final  . MCV 04/06/2013 89.8  78.0 - 100.0 fL Final  .  MCH 04/06/2013 31.4  26.0 - 34.0 pg Final  . MCHC 04/06/2013 35.0  30.0 - 36.0 g/dL Final  . RDW 04/06/2013 13.1  11.5 - 15.5 % Final  . Platelets 04/06/2013 138* 150 - 400 K/uL Final  . Sodium 04/06/2013 127* 137 - 147 mEq/L Final   DELTA CHECK NOTED  . Potassium 04/06/2013 4.3  3.7 - 5.3 mEq/L Final  . Chloride 04/06/2013 91* 96 - 112 mEq/L Final  . CO2 04/06/2013 24  19 - 32 mEq/L Final  . Glucose, Bld 04/06/2013 146* 70 - 99 mg/dL Final  . BUN 04/06/2013 20  6 - 23 mg/dL Final  . Creatinine, Ser 04/06/2013 1.00  0.50 - 1.10 mg/dL Final  . Calcium 04/06/2013 8.8  8.4 - 10.5 mg/dL Final  . GFR calc non Af Amer 04/06/2013 51* >90 mL/min Final  . GFR calc Af Amer 04/06/2013 59* >90 mL/min Final   Comment: (NOTE)                          The eGFR has been calculated using the CKD EPI equation.                          This calculation has not been validated in all clinical situations.                          eGFR's persistently <90 mL/min signify possible Chronic Kidney                          Disease.  Hospital Outpatient Visit on 04/02/2013  Component Date Value Ref Range Status  . aPTT 04/02/2013 27  24 - 37 seconds Final  . Sodium 04/02/2013 141  137 - 147 mEq/L Final  . Potassium 04/02/2013 4.2  3.7 - 5.3 mEq/L Final  . Chloride 04/02/2013 101  96 - 112 mEq/L Final  . CO2 04/02/2013 26  19 -  32 mEq/L Final  . Glucose, Bld 04/02/2013 104* 70 - 99 mg/dL Final  . BUN 04/02/2013 22  6 - 23 mg/dL Final  . Creatinine, Ser 04/02/2013 1.18* 0.50 - 1.10 mg/dL Final  . Calcium 04/02/2013 10.0  8.4 - 10.5 mg/dL Final  . Total Protein 04/02/2013 7.4  6.0 - 8.3 g/dL Final  . Albumin 04/02/2013 4.4  3.5 - 5.2 g/dL Final  . AST 04/02/2013 18  0 - 37 U/L Final  . ALT 04/02/2013 11  0 - 35 U/L Final  . Alkaline Phosphatase 04/02/2013 96  39 - 117 U/L Final  . Total Bilirubin 04/02/2013 0.5  0.3 - 1.2 mg/dL Final  . GFR calc non Af Amer 04/02/2013 41* >90 mL/min Final  . GFR calc Af Amer  04/02/2013 48* >90 mL/min Final   Comment: (NOTE)                          The eGFR has been calculated using the CKD EPI equation.                          This calculation has not been validated in all clinical situations.                          eGFR's persistently <90 mL/min signify possible Chronic Kidney                          Disease.  Marland Kitchen Prothrombin Time 04/02/2013 13.1  11.6 - 15.2 seconds Final  . INR 04/02/2013 1.01  0.00 - 1.49 Final  . ABO/RH(D) 04/02/2013 O NEG   Final  . Antibody Screen 04/02/2013 NEG   Final  . Sample Expiration 04/02/2013 04/16/2013   Final  . Color, Urine 04/02/2013 YELLOW  YELLOW Final  . APPearance 04/02/2013 CLEAR  CLEAR Final  . Specific Gravity, Urine 04/02/2013 1.025  1.005 - 1.030 Final  . pH 04/02/2013 6.0  5.0 - 8.0 Final  . Glucose, UA 04/02/2013 NEGATIVE  NEGATIVE mg/dL Final  . Hgb urine dipstick 04/02/2013 NEGATIVE  NEGATIVE Final  . Bilirubin Urine 04/02/2013 SMALL* NEGATIVE Final  . Ketones, ur 04/02/2013 NEGATIVE  NEGATIVE mg/dL Final  . Protein, ur 04/02/2013 NEGATIVE  NEGATIVE mg/dL Final  . Urobilinogen, UA 04/02/2013 1.0  0.0 - 1.0 mg/dL Final  . Nitrite 04/02/2013 NEGATIVE  NEGATIVE Final  . Leukocytes, UA 04/02/2013 SMALL* NEGATIVE Final  . WBC 04/02/2013 5.0  4.0 - 10.5 K/uL Final  . RBC 04/02/2013 3.73* 3.87 - 5.11 MIL/uL Final  . Hemoglobin 04/02/2013 11.4* 12.0 - 15.0 g/dL Final  . HCT 04/02/2013 34.7* 36.0 - 46.0 % Final  . MCV 04/02/2013 93.0  78.0 - 100.0 fL Final  . MCH 04/02/2013 30.6  26.0 - 34.0 pg Final  . MCHC 04/02/2013 32.9  30.0 - 36.0 g/dL Final  . RDW 04/02/2013 13.1  11.5 - 15.5 % Final  . Platelets 04/02/2013 176  150 - 400 K/uL Final  . ABO/RH(D) 04/02/2013 O NEG   Final  . Squamous Epithelial / LPF 04/02/2013 RARE  RARE Final  . WBC, UA 04/02/2013 0-2  <3 WBC/hpf Final  . RBC / HPF 04/02/2013 0-2  <3 RBC/hpf Final  . Casts 04/02/2013 HYALINE CASTS* NEGATIVE Final  . Urine-Other 04/02/2013 MUCOUS  PRESENT   Final  . MRSA, PCR 04/02/2013 NEGATIVE  NEGATIVE  Final  . Staphylococcus aureus 04/02/2013 POSITIVE* NEGATIVE Final   Comment:                                 The Xpert SA Assay (FDA                          approved for NASAL specimens                          in patients over 78 years of age),                          is one component of                          a comprehensive surveillance                          program.  Test performance has                          been validated by [employer] for patients greater                          than or equal to 57 year old.                          It is not intended                          to diagnose infection nor to                          guide or monitor treatment.  Office Visit on 03/05/2013  Component Date Value Ref Range Status  . WBC 03/05/2013 6.3  4.5 - 10.5 K/uL Final  . RBC 03/05/2013 3.44* 3.87 - 5.11 Mil/uL Final  . Platelets 03/05/2013 193.0  150.0 - 400.0 K/uL Final  . Hemoglobin 03/05/2013 10.9* 12.0 - 15.0 g/dL Final  . HCT 03/05/2013 31.9* 36.0 - 46.0 % Final  . MCV 03/05/2013 92.9  78.0 - 100.0 fl Final  . MCHC 03/05/2013 34.0  30.0 - 36.0 g/dL Final  . RDW 03/05/2013 14.2  11.5 - 14.6 % Final  . TSH 03/05/2013 0.48  0.35 - 5.50 uIU/mL Final  . Sodium 03/05/2013 140  135 - 145 mEq/L Final  . Potassium 03/05/2013 4.2  3.5 - 5.1 mEq/L Final  . Chloride 03/05/2013 107  96 - 112 mEq/L Final  . CO2 03/05/2013 26  19 - 32 mEq/L Final  . Glucose, Bld 03/05/2013 103* 70 - 99 mg/dL Final  . BUN 03/05/2013 22  6 - 23 mg/dL Final  . Creatinine, Ser 03/05/2013 1.5* 0.4 - 1.2 mg/dL Final  . Total Bilirubin 03/05/2013 0.5  0.3 - 1.2 mg/dL Final  . Alkaline Phosphatase 03/05/2013 86  39 - 117 U/L Final  . AST 03/05/2013 23  0 - 37 U/L Final  . ALT 03/05/2013 15  0 - 35 U/L Final  . Total Protein 03/05/2013 6.8  6.0 - 8.3 g/dL Final  . Albumin 03/05/2013 4.1  3.5 - 5.2 g/dL Final  .  Calcium 03/05/2013 9.5  8.4 - 10.5 mg/dL Final  . GFR 03/05/2013 36.02* >60.00 mL/min Final     X-Rays:Dg Chest 2 View  04/02/2013   CLINICAL DATA:  Preoperative examination (knee replacement), history of atrial fibrillation, DVT, asthma  EXAM: CHEST  2 VIEW  COMPARISON:  DG CHEST 2 VIEW dated 03/17/2012; CT ABD/PELV WO CM dated 10/02/2010; CT CHEST W/CM dated 06/06/2008; DG CHEST 1V PORT dated 06/05/2008  FINDINGS: Grossly unchanged borderline enlarged cardiac silhouette and mediastinal contours given persistently reduced lung volumes. There is persistent mild eventration / elevation the medial aspect of the right hemidiaphragm. Grossly unchanged bibasilar heterogeneous opacities favored to represent atelectasis or scar. No new focal airspace opacities. No pleural effusion or pneumothorax. No definite evidence of edema. No acute osseus abnormalities. Post cholecystectomy.  IMPRESSION: Bibasilar atelectasis / scar without acute cardiopulmonary disease.   Electronically Signed   By: Sandi Mariscal M.D.   On: 04/02/2013 15:02   Dg Knee Left Port  04/04/2013   CLINICAL DATA:  Left knee replacement.  EXAM: PORTABLE LEFT KNEE - 1-2 VIEW  COMPARISON:  Plain films left knee 05/01/2010.  FINDINGS: The patient has a new left total knee arthroplasty. The device is located. Gas in the soft tissues and surgical drain are noted. There is no fracture.  IMPRESSION: Left total knee replacement.  No acute finding.   Electronically Signed   By: Inge Rise M.D.   On: 04/04/2013 16:26    EKG: Orders placed in visit on 04/02/13  . EKG 12-LEAD     Hospital Course: [name] is a 80 y.o. who was admitted to [facility]. They were brought to the operating room on 04/04/2013 and underwent Procedure(s): LEFT TOTAL KNEE ARTHROPLASTY.  Patient tolerated the procedure well and was later transferred to the recovery room and then to the orthopaedic floor for postoperative care.  They were given PO and IV analgesics  for pain control following their surgery.  They were given 24 hours of postoperative antibiotics of  Anti-infectives   Start     Dose/Rate Route Frequency Ordered Stop   04/04/13 2000  ceFAZolin (ANCEF) IVPB 1 g/50 mL premix     1 g 100 mL/hr over 30 Minutes Intravenous Every 6 hours 04/04/13 1656 04/05/13 0232   04/04/13 1446  polymyxin B 500,000 Units, bacitracin 50,000 Units in sodium chloride irrigation 0.9 % 500 mL irrigation  Status:  Discontinued       As needed 04/04/13 1446 04/04/13 1540   04/04/13 1126  ceFAZolin (ANCEF) IVPB 2 g/50 mL premix     2 g 100 mL/hr over 30 Minutes Intravenous On call to O.R. 04/04/13 1127 04/04/13 1336     and started on DVT prophylaxis in the form of Xarelto.   PT and OT were ordered for total joint protocol.  Discharge planning consulted to help with postop disposition and equipment needs.  Patient had a fair night on the evening of surgery.  They started to get up OOB with therapy on day one. Hemovac drain was pulled without difficulty.  Continued to work with therapy into day two. Patient had some issues with confusion with Percocet and was therefore switched to Ultram. Patient's Hgb was low at 7.7. Bolus given. At time of discharge summary, patient was asymptomatic and repeat Hgb  ordered for 1500 post op day two. If patient's Hgb decreased and/or she became symptomatic plan was for transfusion of two units of blood. Plan for discharge to SNF post op day three.   Discharge Medications: Prior to Admission medications   Medication Sig Start Date End Date Taking? Authorizing Provider  amLODipine (NORVASC) 5 MG tablet Take 5 mg by mouth every morning.   Yes Historical Provider, MD  ferrous sulfate 325 (65 FE) MG tablet Take 325 mg by mouth every evening.    Yes Historical Provider, MD  FLOVENT HFA 110 MCG/ACT inhaler Inhale 1 puff into the lungs every morning.  02/23/12  Yes Historical Provider, MD  montelukast (SINGULAIR) 10 MG tablet Take 10 mg by mouth at  bedtime.     Yes Historical Provider, MD  pantoprazole (PROTONIX) 20 MG tablet Take 40 mg by mouth 2 (two) times daily.   Yes Historical Provider, MD  sertraline (ZOLOFT) 50 MG tablet Take 1 tablet by mouth every evening.  03/15/12  Yes Historical Provider, MD  ULORIC 40 MG tablet Take 1 tablet by mouth daily. 03/15/12  Yes Historical Provider, MD  albuterol (PROVENTIL HFA;VENTOLIN HFA) 108 (90 BASE) MCG/ACT inhaler Inhale 1 puff into the lungs every 6 (six) hours as needed for wheezing or shortness of breath.    Historical Provider, MD  HYDROcodone-acetaminophen (NORCO/VICODIN) 5-325 MG per tablet Take 1-2 tablets by mouth every 4 (four) hours as needed for moderate pain (breakthrough pain). 04/06/13   Knoah Nedeau Renelda Loma, PA-C  methocarbamol (ROBAXIN) 500 MG tablet Take 1 tablet (500 mg total) by mouth every 6 (six) hours as needed for muscle spasms. 04/05/13   Josalynn Johndrow Renelda Loma, PA-C  oxyCODONE-acetaminophen (PERCOCET/ROXICET) 5-325 MG per tablet Take 1-2 tablets by mouth every 4 (four) hours as needed for severe pain. 04/05/13   Kalin Amrhein Renelda Loma, PA-C  polyethylene glycol (MIRALAX / GLYCOLAX) packet Take 17 g by mouth daily as needed for mild constipation. 04/05/13   Brayson Livesey Renelda Loma, PA-C  pravastatin (PRAVACHOL) 20 MG tablet Take 20 mg by mouth every evening.     Historical Provider, MD  rivaroxaban (XARELTO) 10 MG TABS tablet Take 1 tablet (10 mg total) by mouth daily with breakfast. 04/05/13   Randall Rampersad Renelda Loma, PA-C  traMADol (ULTRAM) 50 MG tablet Take 1 tablet (50 mg total) by mouth every 6 (six) hours as needed for moderate pain. 04/06/13   Rohaan Durnil Renelda Loma, PA-C    Diet: Cardiac diet Activity:WBAT Follow-up:in 2 weeks Disposition - Skilled nursing facility Discharged Condition: stable   Discharge Orders   Future Orders Complete By Expires   Call MD / Call 911  As directed    Comments:     If you experience chest pain or shortness of breath, CALL 911 and be  transported to the hospital emergency room.  If you develope a fever above 101 F, pus (white drainage) or increased drainage or redness at the wound, or calf pain, call your surgeon's office.   Constipation Prevention  As directed    Comments:     Drink plenty of fluids.  Prune juice may be helpful.  You may use a stool softener, such as Colace (over the counter) 100 mg twice a day.  Use MiraLax (over the counter) for constipation as needed.   Diet - low sodium heart healthy  As directed    Discharge instructions  As directed    Comments:     Walk with your walker. Weight bearing as tolerated. Home Health  Agency will follow you at home for your therapy Do not change your dressing unless there is excess drainage Shower only, no tub bath. Do not resume vitamins and supplements until completion of three week course of Xarelto Call if any temperatures greater than 101 or any wound complications: [phone] during the day and ask for Dr. Charlestine Night nurse, Brunilda Payor.   Do not put a pillow under the knee. Place it under the heel.  As directed    Driving restrictions  As directed    Comments:     No driving   Increase activity slowly as tolerated  As directed        Medication List    STOP taking these medications       aspirin 81 MG tablet     CO Q 10 PO     fish oil-omega-3 fatty acids 1000 MG capsule     Glucosamine-Chondroitin-Vit C 2000-1200-60 MG/30ML Liqd     multivitamin with minerals Tabs tablet     oxyCODONE-acetaminophen 10-325 MG per tablet  Commonly known as:  PERCOCET  Replaced by:  oxyCODONE-acetaminophen 5-325 MG per tablet     Vitamin D 2000 UNITS tablet      TAKE these medications       albuterol 108 (90 BASE) MCG/ACT inhaler  Commonly known as:  PROVENTIL HFA;VENTOLIN HFA  Inhale 1 puff into the lungs every 6 (six) hours as needed for wheezing or shortness of breath.     amLODipine 5 MG tablet  Commonly known as:  NORVASC  Take 5 mg by mouth every  morning.     ferrous sulfate 325 (65 FE) MG tablet  Take 325 mg by mouth every evening.     FLOVENT HFA 110 MCG/ACT inhaler  Generic drug:  fluticasone  Inhale 1 puff into the lungs every morning.     HYDROcodone-acetaminophen 5-325 MG per tablet  Commonly known as:  NORCO/VICODIN  Take 1-2 tablets by mouth every 4 (four) hours as needed for moderate pain (breakthrough pain).     methocarbamol 500 MG tablet  Commonly known as:  ROBAXIN  Take 1 tablet (500 mg total) by mouth every 6 (six) hours as needed for muscle spasms.     montelukast 10 MG tablet  Commonly known as:  SINGULAIR  Take 10 mg by mouth at bedtime.     oxyCODONE-acetaminophen 5-325 MG per tablet  Commonly known as:  PERCOCET/ROXICET  Take 1-2 tablets by mouth every 4 (four) hours as needed for severe pain.     pantoprazole 20 MG tablet  Commonly known as:  PROTONIX  Take 40 mg by mouth 2 (two) times daily.     polyethylene glycol packet  Commonly known as:  MIRALAX / GLYCOLAX  Take 17 g by mouth daily as needed for mild constipation.     pravastatin 20 MG tablet  Commonly known as:  PRAVACHOL  Take 20 mg by mouth every evening.     rivaroxaban 10 MG Tabs tablet  Commonly known as:  XARELTO  Take 1 tablet (10 mg total) by mouth daily with breakfast.     sertraline 50 MG tablet  Commonly known as:  ZOLOFT  Take 1 tablet by mouth every evening.     traMADol 50 MG tablet  Commonly known as:  ULTRAM  Take 1 tablet (50 mg total) by mouth every 6 (six) hours as needed for moderate pain.     ULORIC 40 MG tablet  Generic drug:  febuxostat  Take  1 tablet by mouth daily.           Follow-up Information   Follow up with GIOFFRE,RONALD A, MD In 2 weeks.   Specialty:  Orthopedic Surgery   Contact information:   [address] [place] [address] [phone]       Signed: Malachy Chamber 04/06/2013, 7:57 AM

## 2013-04-06 NOTE — Progress Notes (Signed)
Subjective: 2 Days Post-Op Procedure(s) (LRB): LEFT TOTAL KNEE ARTHROPLASTY (Left) Patient reports pain as 3 on 0-10 scale.    Objective: Vital signs in last 24 hours: Temp:  [97.8 F (36.6 C)-100.4 F (38 C)] 99.5 F (37.5 C) (02/27 1855) Pulse Rate:  [61-89] 89 (02/27 1855) Resp:  [14-18] 18 (02/27 1732) BP: (106-137)/(55-75) 130/71 mmHg (02/27 1855) SpO2:  [90 %-93 %] 91 % (02/27 1855)  Intake/Output from previous day: 02/26 0701 - 02/27 0700 In: 3073 [P.O.:840; I.V.:2233] Out: 750 [Urine:750] Intake/Output this shift: Total I/O In: -  Out: 200 [Urine:200]   Recent Labs  04/05/13 0330 04/06/13 0428 04/06/13 1430  HGB 9.4* 7.7* 7.5*    Recent Labs  04/05/13 0330 04/06/13 0428 04/06/13 1430  WBC 11.8* 8.8  --   RBC 3.07* 2.45*  --   HCT 28.3* 22.0* 21.7*  PLT 164 138*  --     Recent Labs  04/05/13 0330 04/06/13 0428  NA 134* 127*  K 4.4 4.3  CL 97 91*  CO2 22 24  BUN 23 20  CREATININE 1.02 1.00  GLUCOSE 177* 146*  CALCIUM 9.0 8.8   No results found for this basename: LABPT, INR,  in the last 72 hours  Neurologically intact  Assessment/Plan: 2 Days Post-Op Procedure(s) (LRB): LEFT TOTAL KNEE ARTHROPLASTY (Left) Up with therapy  Ruey Storer A 04/06/2013, 7:36 PM

## 2013-04-07 DIAGNOSIS — IMO0001 Reserved for inherently not codable concepts without codable children: Secondary | ICD-10-CM | POA: Diagnosis not present

## 2013-04-07 DIAGNOSIS — M6281 Muscle weakness (generalized): Secondary | ICD-10-CM | POA: Diagnosis not present

## 2013-04-07 DIAGNOSIS — IMO0002 Reserved for concepts with insufficient information to code with codable children: Secondary | ICD-10-CM | POA: Diagnosis not present

## 2013-04-07 DIAGNOSIS — M25569 Pain in unspecified knee: Secondary | ICD-10-CM | POA: Diagnosis not present

## 2013-04-07 DIAGNOSIS — Z87891 Personal history of nicotine dependence: Secondary | ICD-10-CM | POA: Diagnosis not present

## 2013-04-07 DIAGNOSIS — K219 Gastro-esophageal reflux disease without esophagitis: Secondary | ICD-10-CM | POA: Diagnosis not present

## 2013-04-07 DIAGNOSIS — Z86718 Personal history of other venous thrombosis and embolism: Secondary | ICD-10-CM | POA: Diagnosis not present

## 2013-04-07 DIAGNOSIS — E7800 Pure hypercholesterolemia, unspecified: Secondary | ICD-10-CM | POA: Diagnosis not present

## 2013-04-07 DIAGNOSIS — R262 Difficulty in walking, not elsewhere classified: Secondary | ICD-10-CM | POA: Diagnosis not present

## 2013-04-07 DIAGNOSIS — Z471 Aftercare following joint replacement surgery: Secondary | ICD-10-CM | POA: Diagnosis not present

## 2013-04-07 DIAGNOSIS — M1710 Unilateral primary osteoarthritis, unspecified knee: Secondary | ICD-10-CM | POA: Diagnosis not present

## 2013-04-07 DIAGNOSIS — Z5189 Encounter for other specified aftercare: Secondary | ICD-10-CM | POA: Diagnosis not present

## 2013-04-07 DIAGNOSIS — M129 Arthropathy, unspecified: Secondary | ICD-10-CM | POA: Diagnosis not present

## 2013-04-07 DIAGNOSIS — Z9889 Other specified postprocedural states: Secondary | ICD-10-CM | POA: Diagnosis not present

## 2013-04-07 DIAGNOSIS — M7989 Other specified soft tissue disorders: Secondary | ICD-10-CM | POA: Diagnosis not present

## 2013-04-07 DIAGNOSIS — J45909 Unspecified asthma, uncomplicated: Secondary | ICD-10-CM | POA: Diagnosis not present

## 2013-04-07 DIAGNOSIS — F3289 Other specified depressive episodes: Secondary | ICD-10-CM | POA: Diagnosis not present

## 2013-04-07 DIAGNOSIS — M109 Gout, unspecified: Secondary | ICD-10-CM | POA: Diagnosis not present

## 2013-04-07 DIAGNOSIS — I6789 Other cerebrovascular disease: Secondary | ICD-10-CM | POA: Diagnosis not present

## 2013-04-07 DIAGNOSIS — Z79899 Other long term (current) drug therapy: Secondary | ICD-10-CM | POA: Diagnosis not present

## 2013-04-07 DIAGNOSIS — I209 Angina pectoris, unspecified: Secondary | ICD-10-CM | POA: Diagnosis not present

## 2013-04-07 DIAGNOSIS — Z8701 Personal history of pneumonia (recurrent): Secondary | ICD-10-CM | POA: Diagnosis not present

## 2013-04-07 DIAGNOSIS — Z96659 Presence of unspecified artificial knee joint: Secondary | ICD-10-CM | POA: Diagnosis not present

## 2013-04-07 DIAGNOSIS — Z8669 Personal history of other diseases of the nervous system and sense organs: Secondary | ICD-10-CM | POA: Diagnosis not present

## 2013-04-07 DIAGNOSIS — D649 Anemia, unspecified: Secondary | ICD-10-CM | POA: Diagnosis not present

## 2013-04-07 DIAGNOSIS — F329 Major depressive disorder, single episode, unspecified: Secondary | ICD-10-CM | POA: Diagnosis not present

## 2013-04-07 DIAGNOSIS — I4891 Unspecified atrial fibrillation: Secondary | ICD-10-CM | POA: Diagnosis not present

## 2013-04-07 DIAGNOSIS — R609 Edema, unspecified: Secondary | ICD-10-CM | POA: Diagnosis not present

## 2013-04-07 DIAGNOSIS — I10 Essential (primary) hypertension: Secondary | ICD-10-CM | POA: Diagnosis not present

## 2013-04-07 DIAGNOSIS — D62 Acute posthemorrhagic anemia: Secondary | ICD-10-CM | POA: Diagnosis not present

## 2013-04-07 DIAGNOSIS — Z7901 Long term (current) use of anticoagulants: Secondary | ICD-10-CM | POA: Diagnosis not present

## 2013-04-07 DIAGNOSIS — S8990XA Unspecified injury of unspecified lower leg, initial encounter: Secondary | ICD-10-CM | POA: Diagnosis not present

## 2013-04-07 LAB — CBC
HCT: 27 % — ABNORMAL LOW (ref 36.0–46.0)
Hemoglobin: 9.5 g/dL — ABNORMAL LOW (ref 12.0–15.0)
MCH: 30.8 pg (ref 26.0–34.0)
MCHC: 35.2 g/dL (ref 30.0–36.0)
MCV: 87.7 fL (ref 78.0–100.0)
Platelets: 127 10*3/uL — ABNORMAL LOW (ref 150–400)
RBC: 3.08 MIL/uL — ABNORMAL LOW (ref 3.87–5.11)
RDW: 14.9 % (ref 11.5–15.5)
WBC: 9.2 10*3/uL (ref 4.0–10.5)

## 2013-04-07 NOTE — Progress Notes (Signed)
Per MD, Pt ready for d/c.  Notified RN, Pt, family and facility.  Confirmed receipt of d/c summary with Vondra at [facility], as admission arranged by Weekday CSW.  Facility ready to receive Pt.  Arranged for transportation.  Bernita Raisin, [place] Work [phone]

## 2013-04-07 NOTE — Plan of Care (Signed)
Problem: Discharge Progression Outcomes Goal: Barriers To Progression Addressed/Resolved Outcome: Completed/Met Date Met:  04/07/13 Hgb=9.5 Goal: Anticoagulant follow-up in place Outcome: Not Applicable Date Met:  16/14/43 xarelto

## 2013-04-07 NOTE — Progress Notes (Signed)
Discharged from floor via stretcher, EMTs with pt. No changes in assessment. Ivannah Zody, [employer]

## 2013-04-07 NOTE — Progress Notes (Signed)
Subjective: Feeling better after transfusion yesterday.  Some dizziness.     Objective: Vital signs in last 24 hours: Temp:  [97.5 F (36.4 C)-100.4 F (38 C)] 97.5 F (36.4 C) (02/28 0630) Pulse Rate:  [70-89] 70 (02/28 0630) Resp:  [12-18] 14 (02/28 0630) BP: (105-146)/(55-90) 146/70 mmHg (02/28 0630) SpO2:  [90 %-98 %] 92 % (02/28 0630)  Intake/Output from previous day: 02/27 0701 - 02/28 0700 In: 1010 [P.O.:480; I.V.:250; Blood:280] Out: 2025 [Urine:2025] Intake/Output this shift:     Recent Labs  04/05/13 0330 04/06/13 0428 04/06/13 1430 04/07/13 0443  HGB 9.4* 7.7* 7.5* 9.5*    Recent Labs  04/06/13 0428 04/06/13 1430 04/07/13 0443  WBC 8.8  --  9.2  RBC 2.45*  --  3.08*  HCT 22.0* 21.7* 27.0*  PLT 138*  --  127*    Recent Labs  04/05/13 0330 04/06/13 0428  NA 134* 127*  K 4.4 4.3  CL 97 91*  CO2 22 24  BUN 23 20  CREATININE 1.02 1.00  GLUCOSE 177* 146*  CALCIUM 9.0 8.8   No results found for this basename: LABPT, INR,  in the last 72 hours  aquacel dressing intact.  bilat calves nt, nvi.    Assessment/Plan: Will see how she does with PT.  If stable, will transfer to SNF today.  Hgb better after transfusion.     OWENS,JAMES M 04/07/2013, 8:09 AM

## 2013-04-07 NOTE — Progress Notes (Signed)
Physical Therapy Treatment Patient Details Name: [name] MRN: [id number] DOB: Jul 10, 1927 Today's Date: 04/07/2013 Time: [id number] PT Time Calculation (min): 38 min  PT Assessment / Plan / Recommendation  History of Present Illness     PT Comments   Marked improvement in activity tolerance since blood transfusion.  Pt performing therex, to/from commode and ambulating in hall this date  Follow Up Recommendations  SNF     Does the patient have the potential to tolerate intense rehabilitation     Barriers to Discharge        Equipment Recommendations  None recommended by PT    Recommendations for Other Services OT consult  Frequency 7X/week   Progress towards PT Goals Progress towards PT goals: Progressing toward goals  Plan Current plan remains appropriate    Precautions / Restrictions Precautions Precautions: Knee;Fall Required Braces or Orthoses: Knee Immobilizer - Left Knee Immobilizer - Left: Discontinue once straight leg raise with < 10 degree lag Restrictions Weight Bearing Restrictions: No Other Position/Activity Restrictions: WBAT   Pertinent Vitals/Pain 4/10; premed, ice packs provided    Mobility  Bed Mobility Overal bed mobility: Needs Assistance Bed Mobility: Supine to Sit Supine to sit: Min assist;Mod assist General bed mobility comments: cues for sequence and use of UEs to self assist Transfers Overall transfer level: Needs assistance Equipment used: Rolling walker (2 wheeled) Transfers: Sit to/from Stand Sit to Stand: Mod assist General transfer comment: cues for LE management and use of UEs to self assist Ambulation/Gait Ambulation/Gait assistance: Min assist;Mod assist Ambulation Distance (Feet): 25 Feet (and 4' twice) Assistive device: Rolling walker (2 wheeled) Gait Pattern/deviations: Step-to pattern;Decreased step length - right;Decreased step length - left;Shuffle;Trunk flexed Gait velocity: decr General Gait Details: Cues for  sequence posture and position from RW.     Exercises Total Joint Exercises Ankle Circles/Pumps: AROM;10 reps;Supine;Both Quad Sets: AROM;Both;10 reps;Supine Heel Slides: AAROM;Left;Supine;15 reps Straight Leg Raises: AAROM;Left;Supine;15 reps   PT Diagnosis:    PT Problem List:   PT Treatment Interventions:     PT Goals (current goals can now be found in the care plan section) Acute Rehab PT Goals Patient Stated Goal: Resume previous lifestyle with decreased pain and increased LE stability PT Goal Formulation: With patient Time For Goal Achievement: 04/12/13 Potential to Achieve Goals: Good  Visit Information  Last PT Received On: 04/07/13 Assistance Needed: +1    Subjective Data  Subjective: No c/o dizziness Patient Stated Goal: Resume previous lifestyle with decreased pain and increased LE stability   Cognition  Cognition Arousal/Alertness: Awake/alert Behavior During Therapy: WFL for tasks assessed/performed Overall Cognitive Status: Within Functional Limits for tasks assessed Memory: Decreased recall of precautions    Balance     End of Session PT - End of Session Equipment Utilized During Treatment: Gait belt;Left knee immobilizer Activity Tolerance: Patient tolerated treatment well Patient left: in chair;with call bell/phone within reach Nurse Communication: Mobility status   GP     [name] 04/07/2013, 12:50 PM

## 2013-04-08 NOTE — Progress Notes (Signed)
Clinical Social Work Department CLINICAL SOCIAL WORK PLACEMENT NOTE 04/08/2013  Patient:  [name], [name]  Account Number:  [id number] Admit date:  04/04/2013  Clinical Social Worker:  Werner Lean, LCSW  Date/time:  04/05/2013 02:37 PM  Clinical Social Work is seeking post-discharge placement for this patient at the following level of care:   SKILLED NURSING   (*CSW will update this form in Epic as items are completed)     Patient/family provided with [place] Department of Clinical Social Work's list of facilities offering this level of care within the geographic area requested by the patient (or if unable, by the patient's family).  04/05/2013  Patient/family informed of their freedom to choose among providers that offer the needed level of care, that participate in Medicare, Medicaid or managed care program needed by the patient, have an available bed and are willing to accept the patient.    Patient/family informed of [facility]' ownership interest in [facility], as well as of the fact that they are under no obligation to receive care at this facility.  PASARR submitted to EDS on  PASARR number received from EDS on 07/13/2007  FL2 transmitted to all facilities in geographic area requested by pt/family on  04/05/2013 FL2 transmitted to all facilities within larger geographic area on   Patient informed that his/her managed care company has contracts with or will negotiate with  certain facilities, including the following:     Patient/family informed of bed offers received:  04/05/2013 Patient chooses bed at [facility] Physician recommends and patient chooses bed at    Patient to be transferred to [facility] on  04/08/2013 Patient to be transferred to facility by EMS  The following physician request were entered in Epic:   Additional Comments:  Werner Lean LCSW [phone]

## 2013-04-09 ENCOUNTER — Emergency Department (HOSPITAL_COMMUNITY)
Admission: EM | Admit: 2013-04-09 | Discharge: 2013-04-09 | Disposition: A | Discharge status: Home / Self Care | Payer: Medicare Other | Attending: Emergency Medicine | Admitting: Emergency Medicine

## 2013-04-09 ENCOUNTER — Encounter (HOSPITAL_COMMUNITY): Payer: Self-pay | Admitting: Emergency Medicine

## 2013-04-09 ENCOUNTER — Other Ambulatory Visit: Payer: Self-pay | Admitting: *Deleted

## 2013-04-09 DIAGNOSIS — M129 Arthropathy, unspecified: Secondary | ICD-10-CM | POA: Insufficient documentation

## 2013-04-09 DIAGNOSIS — M109 Gout, unspecified: Secondary | ICD-10-CM | POA: Insufficient documentation

## 2013-04-09 DIAGNOSIS — Z96659 Presence of unspecified artificial knee joint: Secondary | ICD-10-CM | POA: Diagnosis not present

## 2013-04-09 DIAGNOSIS — Z87891 Personal history of nicotine dependence: Secondary | ICD-10-CM | POA: Insufficient documentation

## 2013-04-09 DIAGNOSIS — I209 Angina pectoris, unspecified: Secondary | ICD-10-CM | POA: Insufficient documentation

## 2013-04-09 DIAGNOSIS — F3289 Other specified depressive episodes: Secondary | ICD-10-CM | POA: Insufficient documentation

## 2013-04-09 DIAGNOSIS — Z79899 Other long term (current) drug therapy: Secondary | ICD-10-CM | POA: Insufficient documentation

## 2013-04-09 DIAGNOSIS — I10 Essential (primary) hypertension: Secondary | ICD-10-CM | POA: Diagnosis not present

## 2013-04-09 DIAGNOSIS — K219 Gastro-esophageal reflux disease without esophagitis: Secondary | ICD-10-CM | POA: Insufficient documentation

## 2013-04-09 DIAGNOSIS — M7989 Other specified soft tissue disorders: Secondary | ICD-10-CM | POA: Diagnosis not present

## 2013-04-09 DIAGNOSIS — Z7901 Long term (current) use of anticoagulants: Secondary | ICD-10-CM | POA: Insufficient documentation

## 2013-04-09 DIAGNOSIS — Z86718 Personal history of other venous thrombosis and embolism: Secondary | ICD-10-CM | POA: Insufficient documentation

## 2013-04-09 DIAGNOSIS — D649 Anemia, unspecified: Secondary | ICD-10-CM | POA: Insufficient documentation

## 2013-04-09 DIAGNOSIS — I4891 Unspecified atrial fibrillation: Secondary | ICD-10-CM | POA: Insufficient documentation

## 2013-04-09 DIAGNOSIS — J45909 Unspecified asthma, uncomplicated: Secondary | ICD-10-CM | POA: Insufficient documentation

## 2013-04-09 DIAGNOSIS — Z8669 Personal history of other diseases of the nervous system and sense organs: Secondary | ICD-10-CM | POA: Insufficient documentation

## 2013-04-09 DIAGNOSIS — F329 Major depressive disorder, single episode, unspecified: Secondary | ICD-10-CM | POA: Insufficient documentation

## 2013-04-09 DIAGNOSIS — Z8701 Personal history of pneumonia (recurrent): Secondary | ICD-10-CM | POA: Insufficient documentation

## 2013-04-09 DIAGNOSIS — E7800 Pure hypercholesterolemia, unspecified: Secondary | ICD-10-CM | POA: Insufficient documentation

## 2013-04-09 LAB — COMPREHENSIVE METABOLIC PANEL
ALBUMIN: 2.8 g/dL — AB (ref 3.5–5.2)
ALT: 13 U/L (ref 0–35)
AST: 21 U/L (ref 0–37)
Alkaline Phosphatase: 187 U/L — ABNORMAL HIGH (ref 39–117)
BUN: 33 mg/dL — ABNORMAL HIGH (ref 6–23)
CALCIUM: 9.1 mg/dL (ref 8.4–10.5)
CO2: 26 meq/L (ref 19–32)
Chloride: 95 mEq/L — ABNORMAL LOW (ref 96–112)
Creatinine, Ser: 1.26 mg/dL — ABNORMAL HIGH (ref 0.50–1.10)
GFR calc Af Amer: 44 mL/min — ABNORMAL LOW (ref >90)
GFR calc non Af Amer: 38 mL/min — ABNORMAL LOW (ref >90)
Glucose, Bld: 116 mg/dL — ABNORMAL HIGH (ref 70–99)
Potassium: 4.4 mEq/L (ref 3.7–5.3)
SODIUM: 132 meq/L — AB (ref 137–147)
Total Bilirubin: 1 mg/dL (ref 0.3–1.2)
Total Protein: 6 g/dL (ref 6.0–8.3)

## 2013-04-09 LAB — CBC
HCT: 25.5 % — ABNORMAL LOW (ref 36.0–46.0)
Hemoglobin: 8.7 g/dL — ABNORMAL LOW (ref 12.0–15.0)
MCH: 30.3 pg (ref 26.0–34.0)
MCHC: 34.1 g/dL (ref 30.0–36.0)
MCV: 88.9 fL (ref 78.0–100.0)
Platelets: 176 10*3/uL (ref 150–400)
RBC: 2.87 MIL/uL — AB (ref 3.87–5.11)
RDW: 15.2 % (ref 11.5–15.5)
WBC: 8 10*3/uL (ref 4.0–10.5)

## 2013-04-09 LAB — TYPE AND SCREEN
ABO/RH(D): O NEG
Antibody Screen: NEGATIVE
Unit division: 0
Unit division: 0

## 2013-04-09 MED ORDER — OXYCODONE-ACETAMINOPHEN 5-325 MG PO TABS: ORAL_TABLET | ORAL | Status: DC

## 2013-04-09 MED ORDER — TRAMADOL HCL 50 MG PO TABS
50.0000 mg | ORAL_TABLET | Freq: Once | ORAL | Status: AC
  Administered 2013-04-09: 50 mg via ORAL
  Filled 2013-04-09: qty 1

## 2013-04-09 MED ORDER — HYDROCODONE-ACETAMINOPHEN 5-325 MG PO TABS: ORAL_TABLET | ORAL | Status: DC

## 2013-04-09 MED ORDER — TRAMADOL HCL 50 MG PO TABS: ORAL_TABLET | ORAL | Status: DC

## 2013-04-09 NOTE — ED Notes (Signed)
[facility]AR notified for trip for trip from ED to orthopedics, and second trip for orthopedics to [place].

## 2013-04-09 NOTE — Progress Notes (Signed)
CSW consult to pt regarding possible SNF vs discharge home. CSW and CM, [place]. Pt was placed at [facility] 04/08/13 and was back at [facility] on 04/09/13 complaining of knee pain. Pt's daughter and son at bedside. Pt will be transported to [place] for apt today and will go back to [place]. CSW placed call to [facility] to inform. It is the plan for pt to return home with services after discharge from [place].  Pt/family appreciative of CSW and CM.   [address] [place]

## 2013-04-09 NOTE — Progress Notes (Signed)
[facility].Spoke with Dr Ashok Cordia regarding home health services.

## 2013-04-09 NOTE — Discharge Instructions (Signed)
Keep leg elevated. Continue xarelto as instructed by your doctor/surgeon.  Go to Dr Charlestine Night office at 2 pm today for him to assess your knee/leg. Return to ER if worse, new symptoms, fevers, severe pain, increased swelling, spreading redness, trouble breathing, other concern. Follow up with primary care doctor in the next 1-2 weeks regarding your anemia/low blood count.

## 2013-04-09 NOTE — Telephone Encounter (Signed)
[facility] 

## 2013-04-09 NOTE — ED Notes (Signed)
EMS reports the pt had orthoscopic left knee surgery on 2/24. Pt is at [facility] for rehabilitation, pt's caregiver noticed increased swelling in the pt's left knee and stated that the pt's left knee was warm to the touch. Pt denies any pain at this time, pt was treated with percocet this morning. Pt is A&O X4. Pt's 02s reading 89% on RA.

## 2013-04-09 NOTE — Progress Notes (Signed)
Case Manager consult for Discharge planning.Role of CM explained to patient and patients daughter.Patients Daughter reports her mother had been at [facility] over night And today's ED visit was secondary to patients Increased knee swelling.Family also report they would like to have [place].CM explained the CHOICE  List to family and confirmed with Patient/ Daughter the requested home health services.Patient reports she has a Bedside Commode,and shower stool and a walker.

## 2013-04-09 NOTE — ED Notes (Signed)
Family states they are unable to take patient to Dr. OFfice.

## 2013-04-09 NOTE — Progress Notes (Signed)
Orthopedists unable to see patient in ED.The plan is for patient to be seen at pm at Dr Fabian November office and then return to the [facility].CM liaised with [facility] re [place]. As family elect to return to [facility] not leave from ED to return home [facility] are unable to provide services at this time.Family report once settled at the [facility] they will contact [place] For home health assistance.CM provided Contact numbers for [facility] for the family. Dr Ashok Cordia and primary nurse updated with the family's plan.No further CM needs at this time.

## 2013-04-09 NOTE — ED Notes (Signed)
Spoke with Dr. Ashok Cordia, pt to be discharged to [facility] [place] notified.

## 2013-04-09 NOTE — ED Provider Notes (Addendum)
CSN: [id number]     Arrival date & time 04/09/13  0630 History   First MD Initiated Contact with Patient 04/09/13 [phone]     Chief Complaint  Patient presents with  . Post-op Problem     (Consider location/radiation/quality/duration/timing/severity/associated sxs/prior Treatment) The history is provided by the patient and the EMS personnel.  pt with leftTKA 2/24, d/cd to ecf yesterday, they have sent to ED this morning due to swelling about left knee.   Moderate swelling, constant.  Pt denies any abrupt increase in pain or swelling. Denies spreading redness or fever/chills. Pt states has started physical therapy, has been ambulatory. Denies keeping leg elevated consistently when in bed or chair. Denies hx dvt or pe. No cp or sob. Is is on xarelto, denies abn bruising/bleeding.      Past Medical History  Diagnosis Date  . Atrial fibrillation   . Palpitations   . Unspecified essential hypertension   . Personal history of other diseases of digestive system   . Other abnormal glucose   . Odynophagia   . Anginal pain     pressure and flutters  . Hypercholesteremia     controlled  . Depression   . Shortness of breath     with activity  . Asthma     no problems recent  . Pneumonia     hx of  . GERD (gastroesophageal reflux disease)   . Arthritis   . Gout   . Cancer     skin R elbow  . Anemia   . Diverticulitis   . Headache(784.0)     not often  . DVT (deep venous thrombosis)     from birth control, left groin  . Cataract    Past Surgical History  Procedure Laterality Date  . Laryngoscopy / bronchoscopy / esophagoscopy  2010    Dr. Benjamine Mola  . Laminectomy  05/2007    foraminotomy, L4-5 laminectomy  . Cholecystectomy  10/2005    Dr. Effie Shy  . Colonoscopy w/ biopsies  2005, 2011    Dr. Bing Plume, "balloon inside for last one at [facility]"  . Skin cancer excision Right ~2005    r elbow  . Tubal ligation    . Appendectomy  ~55years ago  . Abdominal hysterectomy     partial  . Tumor removal Right ~ 20 years ago    tumor in between toes  . Knee arthroscopy Right   . Total knee arthroplasty Left 04/04/2013    Procedure: LEFT TOTAL KNEE ARTHROPLASTY;  Surgeon: Tobi Bastos, MD;  Location: [facility] ORS;  Service: Orthopedics;  Laterality: Left;   Family History  Problem Relation Age of Onset  . Heart disease Mother   . Diabetes Father    History  Substance Use Topics  . Smoking status: Former Smoker -- 10 years    Types: Cigarettes    Quit date: 02/08/1973  . Smokeless tobacco: Never Used  . Alcohol Use: No   OB History   Grav Para Term Preterm Abortions TAB SAB Ect Mult Living                 Review of Systems  Constitutional: Negative for fever and chills.  HENT: Negative for sore throat.   Eyes: Negative for redness.  Respiratory: Negative for cough and shortness of breath.   Cardiovascular: Negative for chest pain.  Gastrointestinal: Negative for abdominal pain.  Genitourinary: Negative for flank pain.  Musculoskeletal: Negative for back pain and neck pain.  Skin: Negative  for rash.  Neurological: Negative for weakness, numbness and headaches.  Hematological: Does not bruise/bleed easily.  Psychiatric/Behavioral: Negative for confusion.      Allergies  Amoxicillin; Diltiazem hcl; and Sulfonamide derivatives  Home Medications   Current Outpatient Rx  Name  Route  Sig  Dispense  Refill  . amLODipine (NORVASC) 5 MG tablet   Oral   Take 5 mg by mouth every morning.         . ferrous sulfate 325 (65 FE) MG tablet   Oral   Take 325 mg by mouth every evening.          Marland Kitchen FLOVENT HFA 110 MCG/ACT inhaler   Inhalation   Inhale 1 puff into the lungs every morning.          . montelukast (SINGULAIR) 10 MG tablet   Oral   Take 10 mg by mouth at bedtime.           . pantoprazole (PROTONIX) 20 MG tablet   Oral   Take 40 mg by mouth 2 (two) times daily.         . pravastatin (PRAVACHOL) 20 MG tablet   Oral   Take 20  mg by mouth every evening.          . rivaroxaban (XARELTO) 10 MG TABS tablet   Oral   Take 1 tablet (10 mg total) by mouth daily with breakfast.   18 tablet   0   . sertraline (ZOLOFT) 50 MG tablet   Oral   Take 1 tablet by mouth every evening.          Marland Kitchen ULORIC 40 MG tablet   Oral   Take 1 tablet by mouth daily.         Marland Kitchen albuterol (PROVENTIL HFA;VENTOLIN HFA) 108 (90 BASE) MCG/ACT inhaler   Inhalation   Inhale 1 puff into the lungs every 6 (six) hours as needed for wheezing or shortness of breath.         Marland Kitchen HYDROcodone-acetaminophen (NORCO/VICODIN) 5-325 MG per tablet   Oral   Take 1-2 tablets by mouth every 4 (four) hours as needed for moderate pain (breakthrough pain).   60 tablet   0   . methocarbamol (ROBAXIN) 500 MG tablet   Oral   Take 1 tablet (500 mg total) by mouth every 6 (six) hours as needed for muscle spasms.   60 tablet   1   . oxyCODONE-acetaminophen (PERCOCET/ROXICET) 5-325 MG per tablet   Oral   Take 1-2 tablets by mouth every 4 (four) hours as needed for severe pain.   60 tablet   0   . polyethylene glycol (MIRALAX / GLYCOLAX) packet   Oral   Take 17 g by mouth daily as needed for mild constipation.   14 each   0   . traMADol (ULTRAM) 50 MG tablet   Oral   Take 1 tablet (50 mg total) by mouth every 6 (six) hours as needed for moderate pain.   60 tablet   0    BP 111/48  Pulse 66  Temp(Src) 97.9 F (36.6 C) (Oral)  Resp 16  Ht 5\' 3"  (1.6 m)  Wt 170 lb (77.111 kg)  BMI 30.12 kg/m2  SpO2 94% Physical Exam  Nursing note and vitals reviewed. Constitutional: She is oriented to person, place, and time. She appears well-developed and well-nourished. No distress.  HENT:  Head: Atraumatic.  Eyes: Conjunctivae are normal. No scleral icterus.  Neck: Neck supple. No tracheal  deviation present.  Cardiovascular: Normal rate, normal heart sounds and intact distal pulses.   Pulmonary/Chest: Effort normal and breath sounds normal. No  respiratory distress.  Abdominal: Soft. Normal appearance and bowel sounds are normal. She exhibits no distension. There is no tenderness.  Musculoskeletal: She exhibits no edema and no tenderness.  Left knee dressing intact. No spreading redness about incision or purulent drainage from incision. w mild/limited movement knee, no marked pain or findings c/w septic joint. Left lower extremity w moderate edema. Distal pulses palp.   Neurological: She is alert and oriented to person, place, and time.  LLE motor intact. sens grossly intact.   Skin: Skin is warm and dry. No rash noted. She is not diaphoretic.  Psychiatric: She has a normal mood and affect.    ED Course  Procedures (including critical care time)   Results for orders placed during the hospital encounter of 04/09/13  CBC      Result Value Ref Range   WBC 8.0  4.0 - 10.5 K/uL   RBC 2.87 (*) 3.87 - 5.11 MIL/uL   Hemoglobin 8.7 (*) 12.0 - 15.0 g/dL   HCT 25.5 (*) 36.0 - 46.0 %   MCV 88.9  78.0 - 100.0 fL   MCH 30.3  26.0 - 34.0 pg   MCHC 34.1  30.0 - 36.0 g/dL   RDW 15.2  11.5 - 15.5 %   Platelets 176  150 - 400 K/uL  COMPREHENSIVE METABOLIC PANEL      Result Value Ref Range   Sodium 132 (*) 137 - 147 mEq/L   Potassium 4.4  3.7 - 5.3 mEq/L   Chloride 95 (*) 96 - 112 mEq/L   CO2 26  19 - 32 mEq/L   Glucose, Bld 116 (*) 70 - 99 mg/dL   BUN 33 (*) 6 - 23 mg/dL   Creatinine, Ser 1.26 (*) 0.50 - 1.10 mg/dL   Calcium 9.1  8.4 - 10.5 mg/dL   Total Protein 6.0  6.0 - 8.3 g/dL   Albumin 2.8 (*) 3.5 - 5.2 g/dL   AST 21  0 - 37 U/L   ALT 13  0 - 35 U/L   Alkaline Phosphatase 187 (*) 39 - 117 U/L   Total Bilirubin 1.0  0.3 - 1.2 mg/dL   GFR calc non Af Amer 38 (*) >90 mL/min   GFR calc Af Amer 44 (*) >90 mL/min   Dg Chest 2 View  04/02/2013   CLINICAL DATA:  Preoperative examination (knee replacement), history of atrial fibrillation, DVT, asthma  EXAM: CHEST  2 VIEW  COMPARISON:  DG CHEST 2 VIEW dated 03/17/2012; CT ABD/PELV WO  CM dated 10/02/2010; CT CHEST W/CM dated 06/06/2008; DG CHEST 1V PORT dated 06/05/2008  FINDINGS: Grossly unchanged borderline enlarged cardiac silhouette and mediastinal contours given persistently reduced lung volumes. There is persistent mild eventration / elevation the medial aspect of the right hemidiaphragm. Grossly unchanged bibasilar heterogeneous opacities favored to represent atelectasis or scar. No new focal airspace opacities. No pleural effusion or pneumothorax. No definite evidence of edema. No acute osseus abnormalities. Post cholecystectomy.  IMPRESSION: Bibasilar atelectasis / scar without acute cardiopulmonary disease.   Electronically Signed   By: Sandi Mariscal M.D.   On: 04/02/2013 15:02   Dg Knee Left Port  04/04/2013   CLINICAL DATA:  Left knee replacement.  EXAM: PORTABLE LEFT KNEE - 1-2 VIEW  COMPARISON:  Plain films left knee 05/01/2010.  FINDINGS: The patient has a new left total knee arthroplasty.  The device is located. Gas in the soft tissues and surgical drain are noted. There is no fracture.  IMPRESSION: Left total knee replacement.  No acute finding.   Electronically Signed   By: Inge Rise M.D.   On: 04/04/2013 16:26      MDM  Reviewed nursing notes and prior charts for additional history.   Given recent low hgb, transfusion, etc, will get labs.  Will discuss with pts orthopedist/service.  Dr Gladstone Lighter called back and indicates is tied up in office all day, that if pt can get there, he is happy to see her.    Discussed conversation w orthopedist w pts family.  Family now states they want to take pt home w them, they do not want her to return to [facility].  SW and CM called to discuss w pt/family, and do home health referral.  I called pts orthopedist back/second time, as to whether they could see in ED - they request we send to office at 2 pm today and they will see then.   Po fluids. Meal. Pain rx.  Discussed plan w pt/family.  Pt comfortable. No fever/chills. No  sob. Hr 70, rr 16. Pulse ox 94% room air.  Pt appears stable for d/c and transport to her orthopedists office so he can assess her.  Family has reconsidered - state their current plan is to take to Dr Fabian November office now, w subsequent return to [facility].      Mirna Mires, MD 04/09/13 1239

## 2013-04-10 ENCOUNTER — Non-Acute Institutional Stay (SKILLED_NURSING_FACILITY): Payer: Medicare Other | Admitting: Nurse Practitioner

## 2013-04-10 DIAGNOSIS — I10 Essential (primary) hypertension: Secondary | ICD-10-CM

## 2013-04-10 DIAGNOSIS — K5900 Constipation, unspecified: Secondary | ICD-10-CM

## 2013-04-10 DIAGNOSIS — M109 Gout, unspecified: Secondary | ICD-10-CM

## 2013-04-10 DIAGNOSIS — IMO0002 Reserved for concepts with insufficient information to code with codable children: Secondary | ICD-10-CM

## 2013-04-10 DIAGNOSIS — Z96659 Presence of unspecified artificial knee joint: Secondary | ICD-10-CM | POA: Diagnosis not present

## 2013-04-10 DIAGNOSIS — D62 Acute posthemorrhagic anemia: Secondary | ICD-10-CM | POA: Diagnosis not present

## 2013-04-10 DIAGNOSIS — M1710 Unilateral primary osteoarthritis, unspecified knee: Secondary | ICD-10-CM | POA: Diagnosis not present

## 2013-04-10 DIAGNOSIS — M1712 Unilateral primary osteoarthritis, left knee: Secondary | ICD-10-CM

## 2013-04-10 NOTE — Progress Notes (Signed)
Patient ID: [name], female   DOB: 1927/11/09, 80 y.o.   MRN: [id number]    Nursing Home Location:  [place] of Service: SNF (34)  PCP: Antony Blackbird, MD  Allergies  Allergen Reactions  . Amoxicillin     Very weak  . Diltiazem Hcl     unknown  . Sulfonamide Derivatives     unknown    Chief Complaint  Patient presents with  . Hospitalization Follow-up    HPI:  [name], 80 y.o. female, has a history of pain and functional disability in the left knee due to arthritis and has failed non-surgical conservative treatments therefor underwent a total knee replacement and is at [facility] for rehab; pt went to the ED early yesterday morning due to swelling and pain however workup in ED was negative; Dr Emiliano Dyer called and placed pt on doxycyline 100 mg BID, no reports of fever chills. Pain has improved and family seems to think she is getting too much medication and it is making her drowsy; pt reporting constipation that is worse since the hospital.  Review of Systems:  Review of Systems  Constitutional: Negative for fever, chills and malaise/fatigue.  Respiratory: Negative for cough and shortness of breath.   Cardiovascular: Negative for chest pain.  Gastrointestinal: Positive for constipation. Negative for heartburn, abdominal pain and diarrhea.  Genitourinary: Negative for dysuria and urgency.  Musculoskeletal: Positive for joint pain. Negative for myalgias.  Skin: Negative for itching and rash.  Neurological: Positive for weakness. Negative for dizziness and headaches.     Past Medical History  Diagnosis Date  . Atrial fibrillation   . Palpitations   . Unspecified essential hypertension   . Personal history of other diseases of digestive system   . Other abnormal glucose   . Odynophagia   . Anginal pain     pressure and flutters  . Hypercholesteremia     controlled  . Depression   . Shortness of breath     with activity  . Asthma      no problems recent  . Pneumonia     hx of  . GERD (gastroesophageal reflux disease)   . Arthritis   . Gout   . Cancer     skin R elbow  . Anemia   . Diverticulitis   . Headache(784.0)     not often  . DVT (deep venous thrombosis)     from birth control, left groin  . Cataract    Past Surgical History  Procedure Laterality Date  . Laryngoscopy / bronchoscopy / esophagoscopy  2010    Dr. Benjamine Mola  . Laminectomy  05/2007    foraminotomy, L4-5 laminectomy  . Cholecystectomy  10/2005    Dr. Effie Shy  . Colonoscopy w/ biopsies  2005, 2011    Dr. Bing Plume, "balloon inside for last one at [facility]"  . Skin cancer excision Right ~2005    r elbow  . Tubal ligation    . Appendectomy  ~55years ago  . Abdominal hysterectomy      partial  . Tumor removal Right ~ 20 years ago    tumor in between toes  . Knee arthroscopy Right   . Total knee arthroplasty Left 04/04/2013    Procedure: LEFT TOTAL KNEE ARTHROPLASTY;  Surgeon: Tobi Bastos, MD;  Location: [facility];  Service: Orthopedics;  Laterality: Left;   Social History:   reports that she quit smoking about 40 years ago. Her smoking  use included Cigarettes. She smoked 0.00 packs per day for 10 years. She has never used smokeless tobacco. She reports that she does not drink alcohol or use illicit drugs.  Family History  Problem Relation Age of Onset  . Heart disease Mother   . Diabetes Father     Medications: Patient's Medications  New Prescriptions   No medications on file  Previous Medications   ALBUTEROL (PROVENTIL HFA;VENTOLIN HFA) 108 (90 BASE) MCG/ACT INHALER    Inhale 1 puff into the lungs every 6 (six) hours as needed for wheezing or shortness of breath.   AMLODIPINE (NORVASC) 5 MG TABLET    Take 5 mg by mouth every morning.   DOXYCYCLINE (VIBRAMYCIN) 100 MG CAPSULE    Take 100 mg by mouth 2 (two) times daily.   FERROUS SULFATE 325 (65 FE) MG TABLET    Take 325 mg by mouth every evening.    FLOVENT HFA 110  MCG/ACT INHALER    Inhale 1 puff into the lungs every morning.    HYDROCODONE-ACETAMINOPHEN (NORCO/VICODIN) 5-325 MG PER TABLET    Take one tablet by mouth every four hours as needed for moderate pain; Take two tablets by mouth every four hours as needed for breakthrough pain   METHOCARBAMOL (ROBAXIN) 500 MG TABLET    Take 1 tablet (500 mg total) by mouth every 6 (six) hours as needed for muscle spasms.   MONTELUKAST (SINGULAIR) 10 MG TABLET    Take 10 mg by mouth at bedtime.     OXYCODONE-ACETAMINOPHEN (PERCOCET/ROXICET) 5-325 MG PER TABLET    Take one tablet by mouth every four hours as needed for mild to moderate pain; Take two tablets by mouth every four hours as needed for severe pain   PANTOPRAZOLE (PROTONIX) 20 MG TABLET    Take 40 mg by mouth 2 (two) times daily.   POLYETHYLENE GLYCOL (MIRALAX / GLYCOLAX) PACKET    Take 17 g by mouth daily as needed for mild constipation.   PRAVASTATIN (PRAVACHOL) 20 MG TABLET    Take 20 mg by mouth every evening.    RIVAROXABAN (XARELTO) 10 MG TABS TABLET    Take 1 tablet (10 mg total) by mouth daily with breakfast.   SERTRALINE (ZOLOFT) 50 MG TABLET    Take 1 tablet by mouth every evening.    TRAMADOL (ULTRAM) 50 MG TABLET    Take one tablet by mouth every 6 hours as needed for mild pain   ULORIC 40 MG TABLET    Take 1 tablet by mouth daily.  Modified Medications   No medications on file  Discontinued Medications   No medications on file     Physical Exam:  Filed Vitals:   04/10/13 1646  BP: 126/72  Pulse: 86  Temp: 98.6 F (37 C)  Resp: 20    Physical Exam  Constitutional: She is well-developed, well-nourished, and in no distress.  HENT:  Mouth/Throat: Oropharynx is clear and moist. No oropharyngeal exudate.  Eyes: Conjunctivae and EOM are normal. Pupils are equal, round, and reactive to light.  Neck: Normal range of motion. Neck supple.  Cardiovascular: Normal rate, regular rhythm and normal heart sounds.   Pulmonary/Chest: Effort  normal and breath sounds normal. No respiratory distress.  Abdominal: Soft. Bowel sounds are normal. She exhibits no distension.  Musculoskeletal: She exhibits edema and tenderness.  Swelling and tenderness to left knee and leg, no warm or redness noted to incision   Neurological: She is alert.  Skin: Skin is warm and  dry. No erythema.     Labs reviewed: Basic Metabolic Panel:  Recent Labs  04/05/13 0330 04/06/13 0428 04/09/13 0815  NA 134* 127* 132*  K 4.4 4.3 4.4  CL 97 91* 95*  CO2 22 24 26   GLUCOSE 177* 146* 116*  BUN 23 20 33*  CREATININE 1.02 1.00 1.26*  CALCIUM 9.0 8.8 9.1   Liver Function Tests:  Recent Labs  03/05/13 1601 04/02/13 1100 04/09/13 0815  AST 23 18 21   ALT 15 11 13   ALKPHOS 86 96 187*  BILITOT 0.5 0.5 1.0  PROT 6.8 7.4 6.0  ALBUMIN 4.1 4.4 2.8*   No results found for this basename: LIPASE, AMYLASE,  in the last 8760 hours No results found for this basename: AMMONIA,  in the last 8760 hours CBC:  Recent Labs  04/06/13 0428 04/06/13 1430 04/07/13 0443 04/09/13 0815  WBC 8.8  --  9.2 8.0  HGB 7.7* 7.5* 9.5* 8.7*  HCT 22.0* 21.7* 27.0* 25.5*  MCV 89.8  --  87.7 88.9  PLT 138*  --  127* 176   TSH:  Recent Labs  03/05/13 1601  TSH 0.48    Assessment/Plan 1. HYPERTENSION, UNSPECIFIED -conts norvasc   2. Osteoarthritis of left knee -s/p total knee -see 4  3. Acute blood loss anemia -conts iron -will follow up cbc in 1 week  4. Total knee replacement status -currently on doxycycline  -will add florastore for GI health -to cont therapies   -will dc ultram and percocet  --start vicodin 5/325 q 4 hours as needed for pain   5. Unspecified constipation -will start colace 100 mg BID   6. Gout -conts uloric   7. GERD -conts protonix  Labs/tests ordered Cbc and bmp

## 2013-04-16 ENCOUNTER — Non-Acute Institutional Stay (SKILLED_NURSING_FACILITY): Payer: Medicare Other | Admitting: Internal Medicine

## 2013-04-16 ENCOUNTER — Encounter: Payer: Self-pay | Admitting: Internal Medicine

## 2013-04-16 DIAGNOSIS — F3289 Other specified depressive episodes: Secondary | ICD-10-CM

## 2013-04-16 DIAGNOSIS — K219 Gastro-esophageal reflux disease without esophagitis: Secondary | ICD-10-CM

## 2013-04-16 DIAGNOSIS — I10 Essential (primary) hypertension: Secondary | ICD-10-CM | POA: Diagnosis not present

## 2013-04-16 DIAGNOSIS — Z96659 Presence of unspecified artificial knee joint: Secondary | ICD-10-CM | POA: Diagnosis not present

## 2013-04-16 DIAGNOSIS — J45909 Unspecified asthma, uncomplicated: Secondary | ICD-10-CM | POA: Insufficient documentation

## 2013-04-16 DIAGNOSIS — I4891 Unspecified atrial fibrillation: Secondary | ICD-10-CM

## 2013-04-16 DIAGNOSIS — D62 Acute posthemorrhagic anemia: Secondary | ICD-10-CM

## 2013-04-16 DIAGNOSIS — E7800 Pure hypercholesterolemia, unspecified: Secondary | ICD-10-CM

## 2013-04-16 DIAGNOSIS — F32A Depression, unspecified: Secondary | ICD-10-CM

## 2013-04-16 DIAGNOSIS — M109 Gout, unspecified: Secondary | ICD-10-CM | POA: Insufficient documentation

## 2013-04-16 DIAGNOSIS — F329 Major depressive disorder, single episode, unspecified: Secondary | ICD-10-CM

## 2013-04-16 DIAGNOSIS — R630 Anorexia: Secondary | ICD-10-CM

## 2013-04-16 NOTE — Progress Notes (Signed)
MRN: [id number] Name: [name]  Sex: female Age: 80 y.o. DOB: [birth year]  [place] #: [facility] Facility/Room: 212A Level Of Care: SNF Provider: Inocencio Homes D Emergency Contacts: Extended Emergency Contact Information Primary Emergency Contact: Janan Ridge, [place] [address] [place] Phone: [phone] Relation: Grandaughter Secondary Emergency Contact: Ayers,Billy Address: [address]          [place], [place] [address] [place] Phone: [phone] Relation: Son  Code Status: FULL  Allergies: Amoxicillin; Diltiazem hcl; and Sulfonamide derivatives  Chief Complaint  Patient presents with  . nursing home admission    HPI: Patient is 80 y.o. female who is s/p L knee arthroplasty , admitted for OT/PT.  Past Medical History  Diagnosis Date  . Atrial fibrillation   . Palpitations   . Unspecified essential hypertension   . Personal history of other diseases of digestive system   . Other abnormal glucose   . Odynophagia   . Anginal pain     pressure and flutters  . Shortness of breath     with activity  . Pneumonia     hx of  . Arthritis   . Gout   . Cancer     skin R elbow  . Anemia   . Diverticulitis   . Headache(784.0)     not often  . DVT (deep venous thrombosis)     from birth control, left groin  . Cataract   . Hypercholesteremia     controlled  . GERD (gastroesophageal reflux disease)   . Depression   . Asthma     no problems recent    Past Surgical History  Procedure Laterality Date  . Laryngoscopy / bronchoscopy / esophagoscopy  2010    Dr. Benjamine Mola  . Laminectomy  05/2007    foraminotomy, L4-5 laminectomy  . Cholecystectomy  10/2005    Dr. Effie Shy  . Colonoscopy w/ biopsies  2005, 2011    Dr. Bing Plume, "balloon inside for last one at [facility]"  . Skin cancer excision Right ~2005    r elbow  . Tubal ligation    . Appendectomy  ~55years ago  . Abdominal hysterectomy      partial   . Tumor removal Right ~ 20 years ago    tumor in between toes  . Knee arthroscopy Right   . Total knee arthroplasty Left 04/04/2013    Procedure: LEFT TOTAL KNEE ARTHROPLASTY;  Surgeon: Tobi Bastos, MD;  Location: [facility];  Service: Orthopedics;  Laterality: Left;      Medication List       This list is accurate as of: 04/16/13 11:40 AM.  Always use your most recent med list.               albuterol 108 (90 BASE) MCG/ACT inhaler  Commonly known as:  PROVENTIL HFA;VENTOLIN HFA  Inhale 1 puff into the lungs every 6 (six) hours as needed for wheezing or shortness of breath.     amLODipine 5 MG tablet  Commonly known as:  NORVASC  Take 5 mg by mouth every morning.     doxycycline 100 MG capsule  Commonly known as:  VIBRAMYCIN  Take 100 mg by mouth 2 (two) times daily.     ferrous sulfate 325 (65 FE) MG tablet  Take 325 mg by mouth every evening.     FLOVENT HFA 110 MCG/ACT inhaler  Generic drug:  fluticasone  Inhale 1 puff into the lungs every morning.     HYDROcodone-acetaminophen 5-325 MG per tablet  Commonly known as:  NORCO/VICODIN  Take one tablet by mouth every four hours as needed for moderate pain; Take two tablets by mouth every four hours as needed for breakthrough pain     methocarbamol 500 MG tablet  Commonly known as:  ROBAXIN  Take 1 tablet (500 mg total) by mouth every 6 (six) hours as needed for muscle spasms.     montelukast 10 MG tablet  Commonly known as:  SINGULAIR  Take 10 mg by mouth at bedtime.     pantoprazole 20 MG tablet  Commonly known as:  PROTONIX  Take 40 mg by mouth 2 (two) times daily.     polyethylene glycol packet  Commonly known as:  MIRALAX / GLYCOLAX  Take 17 g by mouth daily as needed for mild constipation.     pravastatin 20 MG tablet  Commonly known as:  PRAVACHOL  Take 20 mg by mouth every evening.     rivaroxaban 10 MG Tabs tablet  Commonly known as:  XARELTO  Take 1 tablet (10 mg total) by mouth daily with  breakfast.     sertraline 50 MG tablet  Commonly known as:  ZOLOFT  Take 1 tablet by mouth every evening.     ULORIC 40 MG tablet  Generic drug:  febuxostat  Take 1 tablet by mouth daily.        No orders of the defined types were placed in this encounter.     There is no immunization history on file for this patient.  History  Substance Use Topics  . Smoking status: Former Smoker -- 10 years    Types: Cigarettes    Quit date: 02/08/1973  . Smokeless tobacco: Never Used  . Alcohol Use: No    Family history is noncontributory    Review of Systems  DATA OBTAINED: from patient, family member GENERAL: Feels well no fevers, fatigue, poor appetite SKIN: No itching, rash or wounds EYES: No eye pain, redness, discharge EARS: No earache, tinnitus, change in hearing NOSE: No congestion, drainage or bleeding  MOUTH/THROAT: No mouth or tooth pain, No sore throat, No difficulty chewing or swallowing  RESPIRATORY: No cough, wheezing, SOB CARDIAC: No chest pain, palpitations, lower extremity edema  GI: No abdominal pain, No N/V/D or constipation, No heartburn or reflux  GU: No dysuria, frequency or urgency, or incontinence  MUSCULOSKELETAL: bone/joint pain- might need more pain medications at times NEUROLOGIC: No headache, dizziness or focal weakness PSYCHIATRIC: No overt anxiety or sadness. Sleeps well. No behavior issue.   Filed Vitals:   04/16/13 1051  BP: 123/60  Pulse: 65  Temp: 97.2 F (36.2 C)  Resp: 18    Physical Exam  GENERAL APPEARANCE: Alert, conversant. Appropriately groomed. No acute distress.  SKIN: No diaphoresis rash; incision site looks good without redness or swelling HEAD: Normocephalic, atraumatic  EYES: Conjunctiva/lids clear. Pupils round, reactive. EOMs intact.  EARS: External exam WNL, canals clear. Hearing grossly normal.  NOSE: No deformity or discharge.  MOUTH/THROAT: Lips w/o lesions. Mouth and throat normal RESPIRATORY: Breathing is  even, unlabored. Lung sounds are clear   CARDIOVASCULAR: Heart RRR no murmurs, rubs or gallops. No peripheral edema.   GASTROINTESTINAL: Abdomen is soft, non-tender, not distended w/ normal bowel sounds. GENITOURINARY: Bladder non tender, not distended  MUSCULOSKELETAL: No abnormal joints or musculature except L KNee NEUROLOGIC: Oriented X3. Cranial nerves 2-12 grossly intact. Moves all extremities  no tremor. PSYCHIATRIC: Mood and affect appropriate to situation, no behavioral issues  Patient Active Problem List   Diagnosis Date Noted  . Hypercholesteremia   . GERD (gastroesophageal reflux disease)   . Depression   . Asthma   . Gout   . Acute blood loss anemia 04/05/2013  . Osteoarthritis of left knee 04/04/2013  . Total knee replacement status 04/04/2013  . Pulmonary hypertension 03/07/2013  . Anemia, unspecified 03/19/2012  . Unspecified deficiency anemia 03/19/2012  . Dizziness 07/09/2010  . CHEST PAIN UNSPECIFIED 07/30/2009  . HYPERTENSION, UNSPECIFIED 08/19/2008  . PAROXYSMAL ATRIAL FIBRILLATION 08/19/2008  . GASTROESOPHAGEAL REFLUX DISEASE, HX OF 08/19/2008    CBC    Component Value Date/Time   WBC 8.0 04/09/2013 0815   RBC 2.87* 04/09/2013 0815   RBC 3.10* 06/07/2008 0405   HGB 8.7* 04/09/2013 0815   HCT 25.5* 04/09/2013 0815   PLT 176 04/09/2013 0815   MCV 88.9 04/09/2013 0815   LYMPHSABS 2.4 06/20/2010 1327   MONOABS 0.6 06/20/2010 1327   EOSABS 0.1 06/20/2010 1327   BASOSABS 0.0 06/20/2010 1327    CMP     Component Value Date/Time   NA 132* 04/09/2013 0815   K 4.4 04/09/2013 0815   CL 95* 04/09/2013 0815   CO2 26 04/09/2013 0815   GLUCOSE 116* 04/09/2013 0815   BUN 33* 04/09/2013 0815   CREATININE 1.26* 04/09/2013 0815   CALCIUM 9.1 04/09/2013 0815   PROT 6.0 04/09/2013 0815   ALBUMIN 2.8* 04/09/2013 0815   AST 21 04/09/2013 0815   ALT 13 04/09/2013 0815   ALKPHOS 187* 04/09/2013 0815   BILITOT 1.0 04/09/2013 0815   GFRNONAA 38* 04/09/2013 0815   GFRAA 44* 04/09/2013 0815    Assessment and  Plan  Total knee replacement status For endstage OA; Had confusion with percocet in hosp so changed to Ultram but pt was still sent home on percocet which has d/c as well as the ultram; Pt's surgeon started pt on Doxycycline 3/2 presumably for infection at surgical site-it is not stated in the consult note.The site looked good today will dressing partially intact  HYPERTENSION, UNSPECIFIED Continue Amlodipine 5 mg   PAROXYSMAL ATRIAL FIBRILLATION Has been in NSR.On ASA 81 mg for prophylaxis,not anticoag sec to fall risk  Acute blood loss anemia D/c Hb was 8.7; low was 7.7;looks like pt was tx 1 unit although not stated in d/c I found it in EPIC; pt has CBC for this week to recheck  Asthma Continue prn albuterol and daily flovent and singulaire;pt is not wheezing to day  GERD (gastroesophageal reflux disease) Continue protonix  Hypercholesteremia Contoinue pravachol 20 mg;no recent FLP but pt was controlled prior  Depression Continue Zoloft qHS  Gout Continue Uloric  POOR APPETITE -probably from the doxycycline which goes on until 3/12  Hennie Duos, MD

## 2013-04-16 NOTE — Assessment & Plan Note (Signed)
Continue Amlodipine 5 mg

## 2013-04-16 NOTE — Assessment & Plan Note (Signed)
Continue Zoloft qHS

## 2013-04-16 NOTE — Assessment & Plan Note (Signed)
Continue protonix  

## 2013-04-16 NOTE — Assessment & Plan Note (Addendum)
For endstage OA; Had confusion with percocet in hosp so changed to Ultram but pt was still sent home on percocet which has d/c as well as the ultram; Pt's surgeon started pt on Doxycycline 3/2 presumably for infection at surgical site-it is not stated in the consult note.The site looked good today will dressing partially intact

## 2013-04-16 NOTE — Assessment & Plan Note (Signed)
Contoinue pravachol 20 mg;no recent FLP but pt was controlled prior

## 2013-04-16 NOTE — Assessment & Plan Note (Signed)
Has been in NSR.On ASA 81 mg for prophylaxis,not anticoag sec to fall risk

## 2013-04-16 NOTE — Assessment & Plan Note (Signed)
D/c Hb was 8.7; low was 7.7;looks like pt was tx 1 unit although not stated in d/c I found it in EPIC; pt has CBC for this week to recheck

## 2013-04-16 NOTE — Assessment & Plan Note (Signed)
-   Continue Uloric °

## 2013-04-16 NOTE — Assessment & Plan Note (Signed)
Continue prn albuterol and daily flovent and singulaire;pt is not wheezing to day

## 2013-04-23 ENCOUNTER — Non-Acute Institutional Stay (SKILLED_NURSING_FACILITY): Payer: Medicare Other | Admitting: Internal Medicine

## 2013-04-23 ENCOUNTER — Encounter: Payer: Self-pay | Admitting: Internal Medicine

## 2013-04-23 DIAGNOSIS — D62 Acute posthemorrhagic anemia: Secondary | ICD-10-CM

## 2013-04-23 DIAGNOSIS — F3289 Other specified depressive episodes: Secondary | ICD-10-CM

## 2013-04-23 DIAGNOSIS — K219 Gastro-esophageal reflux disease without esophagitis: Secondary | ICD-10-CM

## 2013-04-23 DIAGNOSIS — J45909 Unspecified asthma, uncomplicated: Secondary | ICD-10-CM

## 2013-04-23 DIAGNOSIS — F32A Depression, unspecified: Secondary | ICD-10-CM

## 2013-04-23 DIAGNOSIS — E7800 Pure hypercholesterolemia, unspecified: Secondary | ICD-10-CM

## 2013-04-23 DIAGNOSIS — F329 Major depressive disorder, single episode, unspecified: Secondary | ICD-10-CM | POA: Diagnosis not present

## 2013-04-23 DIAGNOSIS — I4891 Unspecified atrial fibrillation: Secondary | ICD-10-CM

## 2013-04-23 DIAGNOSIS — I10 Essential (primary) hypertension: Secondary | ICD-10-CM

## 2013-04-23 DIAGNOSIS — M109 Gout, unspecified: Secondary | ICD-10-CM

## 2013-04-23 DIAGNOSIS — Z96659 Presence of unspecified artificial knee joint: Secondary | ICD-10-CM | POA: Diagnosis not present

## 2013-04-23 NOTE — Progress Notes (Signed)
MRN: [id number] Name: [name]  Sex: female Age: 80 y.o. DOB: May 11, 1927  House #: [facility] Facility/Room: 112 Level Of Care: SNF Provider: Inocencio Homes D Emergency Contacts: Extended Emergency Contact Information Primary Emergency Contact: Janan Ridge, [place] [address] [place] Phone: [phone] Relation: Grandaughter Secondary Emergency Contact: Ayers,Billy Address: [address]          [place], [place] [address] [place] Phone: [phone] Relation: Son  Code Status: FULL  Allergies: Amoxicillin; Diltiazem hcl; and Sulfonamide derivatives  Chief Complaint  Patient presents with  . Discharge Note    HPI: Patient is 80 y.o. female who is s/p knee replacement who is ready for d/c to home.  Past Medical History  Diagnosis Date  . Atrial fibrillation   . Palpitations   . Unspecified essential hypertension   . Personal history of other diseases of digestive system   . Other abnormal glucose   . Odynophagia   . Anginal pain     pressure and flutters  . Shortness of breath     with activity  . Pneumonia     hx of  . Arthritis   . Gout   . Cancer     skin R elbow  . Anemia   . Diverticulitis   . Headache(784.0)     not often  . DVT (deep venous thrombosis)     from birth control, left groin  . Cataract   . Hypercholesteremia     controlled  . GERD (gastroesophageal reflux disease)   . Depression   . Asthma     no problems recent    Past Surgical History  Procedure Laterality Date  . Laryngoscopy / bronchoscopy / esophagoscopy  2010    Dr. Benjamine Mola  . Laminectomy  05/2007    foraminotomy, L4-5 laminectomy  . Cholecystectomy  10/2005    Dr. Effie Shy  . Colonoscopy w/ biopsies  2005, 2011    Dr. Bing Plume, "balloon inside for last one at [facility]"  . Skin cancer excision Right ~2005    r elbow  . Tubal ligation    . Appendectomy  ~55years ago  . Abdominal hysterectomy      partial  .  Tumor removal Right ~ 20 years ago    tumor in between toes  . Knee arthroscopy Right   . Total knee arthroplasty Left 04/04/2013    Procedure: LEFT TOTAL KNEE ARTHROPLASTY;  Surgeon: Tobi Bastos, MD;  Location: [facility];  Service: Orthopedics;  Laterality: Left;      Medication List       This list is accurate as of: 04/23/13  1:22 PM.  Always use your most recent med list.               albuterol 108 (90 BASE) MCG/ACT inhaler  Commonly known as:  PROVENTIL HFA;VENTOLIN HFA  Inhale 1 puff into the lungs every 6 (six) hours as needed for wheezing or shortness of breath.     amLODipine 5 MG tablet  Commonly known as:  NORVASC  Take 5 mg by mouth every morning.     aspirin 325 MG tablet  Take 325 mg by mouth 2 (two) times daily.     docusate sodium 100 MG capsule  Commonly known as:  COLACE  Take 100 mg by mouth 2 (two) times daily.     ferrous sulfate 325 (65 FE) MG tablet  Take  325 mg by mouth every evening.     FLOVENT HFA 110 MCG/ACT inhaler  Generic drug:  fluticasone  Inhale 1 puff into the lungs every morning.     HYDROcodone-acetaminophen 5-325 MG per tablet  Commonly known as:  NORCO/VICODIN  Take one tablet by mouth every four hours as needed for moderate pain; Take two tablets by mouth every four hours as needed for breakthrough pain     methocarbamol 500 MG tablet  Commonly known as:  ROBAXIN  Take 1 tablet (500 mg total) by mouth every 6 (six) hours as needed for muscle spasms.     montelukast 10 MG tablet  Commonly known as:  SINGULAIR  Take 10 mg by mouth at bedtime.     pantoprazole 20 MG tablet  Commonly known as:  PROTONIX  Take 40 mg by mouth 2 (two) times daily.     polyethylene glycol packet  Commonly known as:  MIRALAX / GLYCOLAX  Take 17 g by mouth daily as needed for mild constipation.     pravastatin 20 MG tablet  Commonly known as:  PRAVACHOL  Take 20 mg by mouth every evening.     sertraline 50 MG tablet  Commonly known as:   ZOLOFT  Take 1 tablet by mouth every evening.     ULORIC 40 MG tablet  Generic drug:  febuxostat  Take 1 tablet by mouth daily.        Meds ordered this encounter  Medications  . aspirin 325 MG tablet    Sig: Take 325 mg by mouth 2 (two) times daily.  Marland Kitchen docusate sodium (COLACE) 100 MG capsule    Sig: Take 100 mg by mouth 2 (two) times daily.     There is no immunization history on file for this patient.  History  Substance Use Topics  . Smoking status: Former Smoker -- 10 years    Types: Cigarettes    Quit date: 02/08/1973  . Smokeless tobacco: Never Used  . Alcohol Use: No    Filed Vitals:   04/23/13 1237  BP: 124/64  Pulse: 76  Temp: 98.6 F (37 C)  Resp: 18    Physical Exam  GENERAL APPEARANCE: Alert, conversant. Appropriately groomed. No acute distress.  HEENT: Unremarkable. RESPIRATORY: Breathing is even, unlabored. Lung sounds are clear   CARDIOVASCULAR: Heart RRR no murmurs, rubs or gallops. No peripheral edema.  GASTROINTESTINAL: Abdomen is soft, non-tender, not distended w/ normal bowel sounds.  NEUROLOGIC: Cranial nerves 2-12 grossly intact. Moves all extremities no tremor.  Patient Active Problem List   Diagnosis Date Noted  . Hypercholesteremia   . GERD (gastroesophageal reflux disease)   . Depression   . Asthma   . Gout   . Acute blood loss anemia 04/05/2013  . Osteoarthritis of left knee 04/04/2013  . Total knee replacement status 04/04/2013  . Pulmonary hypertension 03/07/2013  . Anemia, unspecified 03/19/2012  . Unspecified deficiency anemia 03/19/2012  . Dizziness 07/09/2010  . CHEST PAIN UNSPECIFIED 07/30/2009  . HYPERTENSION, UNSPECIFIED 08/19/2008  . PAROXYSMAL ATRIAL FIBRILLATION 08/19/2008  . GASTROESOPHAGEAL REFLUX DISEASE, HX OF 08/19/2008    CBC    Component Value Date/Time   WBC 8.0 04/09/2013 0815   RBC 2.87* 04/09/2013 0815   RBC 3.10* 06/07/2008 0405   HGB 8.7* 04/09/2013 0815   HCT 25.5* 04/09/2013 0815   PLT 176  04/09/2013 0815   MCV 88.9 04/09/2013 0815   LYMPHSABS 2.4 06/20/2010 1327   MONOABS 0.6 06/20/2010 1327   EOSABS  0.1 06/20/2010 1327   BASOSABS 0.0 06/20/2010 1327    CMP     Component Value Date/Time   NA 132* 04/09/2013 0815   K 4.4 04/09/2013 0815   CL 95* 04/09/2013 0815   CO2 26 04/09/2013 0815   GLUCOSE 116* 04/09/2013 0815   BUN 33* 04/09/2013 0815   CREATININE 1.26* 04/09/2013 0815   CALCIUM 9.1 04/09/2013 0815   PROT 6.0 04/09/2013 0815   ALBUMIN 2.8* 04/09/2013 0815   AST 21 04/09/2013 0815   ALT 13 04/09/2013 0815   ALKPHOS 187* 04/09/2013 0815   BILITOT 1.0 04/09/2013 0815   GFRNONAA 38* 04/09/2013 0815   GFRAA 44* 04/09/2013 0815    Assessment and Plan  Patient is in improved and stable condition and ready to be discharged to home. Of note, pt's xarelto has been stopped and she will now be on ASA 325 BID for prophylaxis.  Hennie Duos, MD

## 2013-04-25 DIAGNOSIS — J45909 Unspecified asthma, uncomplicated: Secondary | ICD-10-CM | POA: Diagnosis not present

## 2013-04-25 DIAGNOSIS — Z471 Aftercare following joint replacement surgery: Secondary | ICD-10-CM | POA: Diagnosis not present

## 2013-04-25 DIAGNOSIS — M109 Gout, unspecified: Secondary | ICD-10-CM | POA: Diagnosis not present

## 2013-04-25 DIAGNOSIS — IMO0001 Reserved for inherently not codable concepts without codable children: Secondary | ICD-10-CM | POA: Diagnosis not present

## 2013-04-25 DIAGNOSIS — Z96659 Presence of unspecified artificial knee joint: Secondary | ICD-10-CM | POA: Diagnosis not present

## 2013-04-25 DIAGNOSIS — D649 Anemia, unspecified: Secondary | ICD-10-CM | POA: Diagnosis not present

## 2013-04-27 DIAGNOSIS — IMO0001 Reserved for inherently not codable concepts without codable children: Secondary | ICD-10-CM | POA: Diagnosis not present

## 2013-04-27 DIAGNOSIS — M109 Gout, unspecified: Secondary | ICD-10-CM | POA: Diagnosis not present

## 2013-04-27 DIAGNOSIS — J45909 Unspecified asthma, uncomplicated: Secondary | ICD-10-CM | POA: Diagnosis not present

## 2013-04-27 DIAGNOSIS — Z471 Aftercare following joint replacement surgery: Secondary | ICD-10-CM | POA: Diagnosis not present

## 2013-04-27 DIAGNOSIS — D649 Anemia, unspecified: Secondary | ICD-10-CM | POA: Diagnosis not present

## 2013-04-27 DIAGNOSIS — Z96659 Presence of unspecified artificial knee joint: Secondary | ICD-10-CM | POA: Diagnosis not present

## 2013-04-30 DIAGNOSIS — J45909 Unspecified asthma, uncomplicated: Secondary | ICD-10-CM | POA: Diagnosis not present

## 2013-04-30 DIAGNOSIS — Z471 Aftercare following joint replacement surgery: Secondary | ICD-10-CM | POA: Diagnosis not present

## 2013-04-30 DIAGNOSIS — D649 Anemia, unspecified: Secondary | ICD-10-CM | POA: Diagnosis not present

## 2013-04-30 DIAGNOSIS — IMO0001 Reserved for inherently not codable concepts without codable children: Secondary | ICD-10-CM | POA: Diagnosis not present

## 2013-04-30 DIAGNOSIS — M109 Gout, unspecified: Secondary | ICD-10-CM | POA: Diagnosis not present

## 2013-04-30 DIAGNOSIS — Z96659 Presence of unspecified artificial knee joint: Secondary | ICD-10-CM | POA: Diagnosis not present

## 2013-05-02 DIAGNOSIS — D649 Anemia, unspecified: Secondary | ICD-10-CM | POA: Diagnosis not present

## 2013-05-02 DIAGNOSIS — IMO0001 Reserved for inherently not codable concepts without codable children: Secondary | ICD-10-CM | POA: Diagnosis not present

## 2013-05-02 DIAGNOSIS — M109 Gout, unspecified: Secondary | ICD-10-CM | POA: Diagnosis not present

## 2013-05-02 DIAGNOSIS — J45909 Unspecified asthma, uncomplicated: Secondary | ICD-10-CM | POA: Diagnosis not present

## 2013-05-02 DIAGNOSIS — Z471 Aftercare following joint replacement surgery: Secondary | ICD-10-CM | POA: Diagnosis not present

## 2013-05-02 DIAGNOSIS — Z96659 Presence of unspecified artificial knee joint: Secondary | ICD-10-CM | POA: Diagnosis not present

## 2013-05-04 DIAGNOSIS — M109 Gout, unspecified: Secondary | ICD-10-CM | POA: Diagnosis not present

## 2013-05-04 DIAGNOSIS — Z471 Aftercare following joint replacement surgery: Secondary | ICD-10-CM | POA: Diagnosis not present

## 2013-05-04 DIAGNOSIS — Z96659 Presence of unspecified artificial knee joint: Secondary | ICD-10-CM | POA: Diagnosis not present

## 2013-05-04 DIAGNOSIS — J45909 Unspecified asthma, uncomplicated: Secondary | ICD-10-CM | POA: Diagnosis not present

## 2013-05-04 DIAGNOSIS — IMO0001 Reserved for inherently not codable concepts without codable children: Secondary | ICD-10-CM | POA: Diagnosis not present

## 2013-05-04 DIAGNOSIS — D649 Anemia, unspecified: Secondary | ICD-10-CM | POA: Diagnosis not present

## 2013-05-07 DIAGNOSIS — D649 Anemia, unspecified: Secondary | ICD-10-CM | POA: Diagnosis not present

## 2013-05-07 DIAGNOSIS — Z471 Aftercare following joint replacement surgery: Secondary | ICD-10-CM | POA: Diagnosis not present

## 2013-05-07 DIAGNOSIS — M109 Gout, unspecified: Secondary | ICD-10-CM | POA: Diagnosis not present

## 2013-05-07 DIAGNOSIS — J45909 Unspecified asthma, uncomplicated: Secondary | ICD-10-CM | POA: Diagnosis not present

## 2013-05-07 DIAGNOSIS — IMO0001 Reserved for inherently not codable concepts without codable children: Secondary | ICD-10-CM | POA: Diagnosis not present

## 2013-05-07 DIAGNOSIS — Z96659 Presence of unspecified artificial knee joint: Secondary | ICD-10-CM | POA: Diagnosis not present

## 2013-05-09 DIAGNOSIS — Z96659 Presence of unspecified artificial knee joint: Secondary | ICD-10-CM | POA: Diagnosis not present

## 2013-05-09 DIAGNOSIS — Z471 Aftercare following joint replacement surgery: Secondary | ICD-10-CM | POA: Diagnosis not present

## 2013-05-09 DIAGNOSIS — J45909 Unspecified asthma, uncomplicated: Secondary | ICD-10-CM | POA: Diagnosis not present

## 2013-05-09 DIAGNOSIS — D649 Anemia, unspecified: Secondary | ICD-10-CM | POA: Diagnosis not present

## 2013-05-09 DIAGNOSIS — IMO0001 Reserved for inherently not codable concepts without codable children: Secondary | ICD-10-CM | POA: Diagnosis not present

## 2013-05-09 DIAGNOSIS — M109 Gout, unspecified: Secondary | ICD-10-CM | POA: Diagnosis not present

## 2013-05-11 DIAGNOSIS — D649 Anemia, unspecified: Secondary | ICD-10-CM | POA: Diagnosis not present

## 2013-05-11 DIAGNOSIS — IMO0001 Reserved for inherently not codable concepts without codable children: Secondary | ICD-10-CM | POA: Diagnosis not present

## 2013-05-11 DIAGNOSIS — Z96659 Presence of unspecified artificial knee joint: Secondary | ICD-10-CM | POA: Diagnosis not present

## 2013-05-11 DIAGNOSIS — J45909 Unspecified asthma, uncomplicated: Secondary | ICD-10-CM | POA: Diagnosis not present

## 2013-05-11 DIAGNOSIS — Z471 Aftercare following joint replacement surgery: Secondary | ICD-10-CM | POA: Diagnosis not present

## 2013-05-11 DIAGNOSIS — M109 Gout, unspecified: Secondary | ICD-10-CM | POA: Diagnosis not present

## 2013-05-14 DIAGNOSIS — Z96659 Presence of unspecified artificial knee joint: Secondary | ICD-10-CM | POA: Diagnosis not present

## 2013-05-14 DIAGNOSIS — M1710 Unilateral primary osteoarthritis, unspecified knee: Secondary | ICD-10-CM | POA: Diagnosis not present

## 2013-05-21 DIAGNOSIS — M25569 Pain in unspecified knee: Secondary | ICD-10-CM | POA: Diagnosis not present

## 2013-05-25 DIAGNOSIS — M25569 Pain in unspecified knee: Secondary | ICD-10-CM | POA: Diagnosis not present

## 2013-05-28 DIAGNOSIS — M25569 Pain in unspecified knee: Secondary | ICD-10-CM | POA: Diagnosis not present

## 2013-05-31 DIAGNOSIS — M25569 Pain in unspecified knee: Secondary | ICD-10-CM | POA: Diagnosis not present

## 2013-06-06 DIAGNOSIS — M25569 Pain in unspecified knee: Secondary | ICD-10-CM | POA: Diagnosis not present

## 2013-06-12 DIAGNOSIS — M25569 Pain in unspecified knee: Secondary | ICD-10-CM | POA: Diagnosis not present

## 2013-06-13 DIAGNOSIS — D649 Anemia, unspecified: Secondary | ICD-10-CM | POA: Diagnosis not present

## 2013-06-13 DIAGNOSIS — I129 Hypertensive chronic kidney disease with stage 1 through stage 4 chronic kidney disease, or unspecified chronic kidney disease: Secondary | ICD-10-CM | POA: Diagnosis not present

## 2013-06-13 DIAGNOSIS — N183 Chronic kidney disease, stage 3 unspecified: Secondary | ICD-10-CM | POA: Diagnosis not present

## 2013-06-14 DIAGNOSIS — M25569 Pain in unspecified knee: Secondary | ICD-10-CM | POA: Diagnosis not present

## 2013-06-19 DIAGNOSIS — M25569 Pain in unspecified knee: Secondary | ICD-10-CM | POA: Diagnosis not present

## 2013-06-21 DIAGNOSIS — M25569 Pain in unspecified knee: Secondary | ICD-10-CM | POA: Diagnosis not present

## 2013-06-26 DIAGNOSIS — M25569 Pain in unspecified knee: Secondary | ICD-10-CM | POA: Diagnosis not present

## 2013-06-28 DIAGNOSIS — M25569 Pain in unspecified knee: Secondary | ICD-10-CM | POA: Diagnosis not present

## 2013-07-05 DIAGNOSIS — M25519 Pain in unspecified shoulder: Secondary | ICD-10-CM | POA: Diagnosis not present

## 2013-07-20 ENCOUNTER — Encounter (HOSPITAL_COMMUNITY): Payer: Self-pay | Admitting: Emergency Medicine

## 2013-07-20 ENCOUNTER — Emergency Department (HOSPITAL_COMMUNITY): Payer: Medicare Other

## 2013-07-20 ENCOUNTER — Emergency Department (INDEPENDENT_AMBULATORY_CARE_PROVIDER_SITE_OTHER)
Admission: EM | Admit: 2013-07-20 | Discharge: 2013-07-20 | Disposition: A | Discharge status: Hospice | Payer: Medicare Other | Source: Home / Self Care | Attending: Emergency Medicine | Admitting: Emergency Medicine

## 2013-07-20 ENCOUNTER — Observation Stay (HOSPITAL_COMMUNITY)
Admission: EM | Admit: 2013-07-20 | Discharge: 2013-07-21 | Disposition: A | Discharge status: Home / Self Care | Payer: Medicare Other | Attending: Internal Medicine | Admitting: Internal Medicine

## 2013-07-20 DIAGNOSIS — J9819 Other pulmonary collapse: Secondary | ICD-10-CM | POA: Diagnosis not present

## 2013-07-20 DIAGNOSIS — I4891 Unspecified atrial fibrillation: Secondary | ICD-10-CM | POA: Diagnosis not present

## 2013-07-20 DIAGNOSIS — R42 Dizziness and giddiness: Secondary | ICD-10-CM

## 2013-07-20 DIAGNOSIS — K5289 Other specified noninfective gastroenteritis and colitis: Secondary | ICD-10-CM | POA: Diagnosis not present

## 2013-07-20 DIAGNOSIS — IMO0001 Reserved for inherently not codable concepts without codable children: Secondary | ICD-10-CM | POA: Diagnosis not present

## 2013-07-20 DIAGNOSIS — R5383 Other fatigue: Secondary | ICD-10-CM

## 2013-07-20 DIAGNOSIS — M549 Dorsalgia, unspecified: Secondary | ICD-10-CM

## 2013-07-20 DIAGNOSIS — M129 Arthropathy, unspecified: Secondary | ICD-10-CM | POA: Insufficient documentation

## 2013-07-20 DIAGNOSIS — F3289 Other specified depressive episodes: Secondary | ICD-10-CM | POA: Diagnosis not present

## 2013-07-20 DIAGNOSIS — M1712 Unilateral primary osteoarthritis, left knee: Secondary | ICD-10-CM

## 2013-07-20 DIAGNOSIS — Z96659 Presence of unspecified artificial knee joint: Secondary | ICD-10-CM | POA: Diagnosis not present

## 2013-07-20 DIAGNOSIS — K56609 Unspecified intestinal obstruction, unspecified as to partial versus complete obstruction: Secondary | ICD-10-CM | POA: Insufficient documentation

## 2013-07-20 DIAGNOSIS — R161 Splenomegaly, not elsewhere classified: Secondary | ICD-10-CM | POA: Diagnosis not present

## 2013-07-20 DIAGNOSIS — R002 Palpitations: Secondary | ICD-10-CM | POA: Diagnosis not present

## 2013-07-20 DIAGNOSIS — K529 Noninfective gastroenteritis and colitis, unspecified: Secondary | ICD-10-CM

## 2013-07-20 DIAGNOSIS — I272 Pulmonary hypertension, unspecified: Secondary | ICD-10-CM

## 2013-07-20 DIAGNOSIS — I10 Essential (primary) hypertension: Secondary | ICD-10-CM | POA: Diagnosis not present

## 2013-07-20 DIAGNOSIS — E7800 Pure hypercholesterolemia, unspecified: Secondary | ICD-10-CM | POA: Diagnosis not present

## 2013-07-20 DIAGNOSIS — R109 Unspecified abdominal pain: Secondary | ICD-10-CM

## 2013-07-20 DIAGNOSIS — R935 Abnormal findings on diagnostic imaging of other abdominal regions, including retroperitoneum: Secondary | ICD-10-CM

## 2013-07-20 DIAGNOSIS — R112 Nausea with vomiting, unspecified: Principal | ICD-10-CM | POA: Insufficient documentation

## 2013-07-20 DIAGNOSIS — H269 Unspecified cataract: Secondary | ICD-10-CM | POA: Diagnosis not present

## 2013-07-20 DIAGNOSIS — K921 Melena: Secondary | ICD-10-CM

## 2013-07-20 DIAGNOSIS — E669 Obesity, unspecified: Secondary | ICD-10-CM | POA: Diagnosis present

## 2013-07-20 DIAGNOSIS — D649 Anemia, unspecified: Secondary | ICD-10-CM

## 2013-07-20 DIAGNOSIS — Z87891 Personal history of nicotine dependence: Secondary | ICD-10-CM | POA: Diagnosis not present

## 2013-07-20 DIAGNOSIS — R5381 Other malaise: Secondary | ICD-10-CM | POA: Diagnosis not present

## 2013-07-20 DIAGNOSIS — F329 Major depressive disorder, single episode, unspecified: Secondary | ICD-10-CM | POA: Diagnosis not present

## 2013-07-20 DIAGNOSIS — Z6830 Body mass index (BMI) 30.0-30.9, adult: Secondary | ICD-10-CM | POA: Diagnosis not present

## 2013-07-20 DIAGNOSIS — R079 Chest pain, unspecified: Secondary | ICD-10-CM

## 2013-07-20 DIAGNOSIS — R531 Weakness: Secondary | ICD-10-CM

## 2013-07-20 DIAGNOSIS — M6281 Muscle weakness (generalized): Secondary | ICD-10-CM | POA: Diagnosis not present

## 2013-07-20 DIAGNOSIS — R1310 Dysphagia, unspecified: Secondary | ICD-10-CM | POA: Diagnosis not present

## 2013-07-20 DIAGNOSIS — Z9181 History of falling: Secondary | ICD-10-CM | POA: Insufficient documentation

## 2013-07-20 DIAGNOSIS — R197 Diarrhea, unspecified: Secondary | ICD-10-CM | POA: Diagnosis not present

## 2013-07-20 DIAGNOSIS — Z66 Do not resuscitate: Secondary | ICD-10-CM | POA: Insufficient documentation

## 2013-07-20 DIAGNOSIS — J45909 Unspecified asthma, uncomplicated: Secondary | ICD-10-CM | POA: Diagnosis not present

## 2013-07-20 DIAGNOSIS — K219 Gastro-esophageal reflux disease without esophagitis: Secondary | ICD-10-CM | POA: Diagnosis not present

## 2013-07-20 DIAGNOSIS — Z86718 Personal history of other venous thrombosis and embolism: Secondary | ICD-10-CM | POA: Diagnosis not present

## 2013-07-20 DIAGNOSIS — D62 Acute posthemorrhagic anemia: Secondary | ICD-10-CM

## 2013-07-20 DIAGNOSIS — F32A Depression, unspecified: Secondary | ICD-10-CM

## 2013-07-20 DIAGNOSIS — Z7982 Long term (current) use of aspirin: Secondary | ICD-10-CM | POA: Insufficient documentation

## 2013-07-20 DIAGNOSIS — Z8719 Personal history of other diseases of the digestive system: Secondary | ICD-10-CM

## 2013-07-20 DIAGNOSIS — N281 Cyst of kidney, acquired: Secondary | ICD-10-CM | POA: Diagnosis not present

## 2013-07-20 DIAGNOSIS — E860 Dehydration: Secondary | ICD-10-CM

## 2013-07-20 DIAGNOSIS — D539 Nutritional anemia, unspecified: Secondary | ICD-10-CM

## 2013-07-20 DIAGNOSIS — M109 Gout, unspecified: Secondary | ICD-10-CM | POA: Diagnosis not present

## 2013-07-20 HISTORY — DX: Fibromyalgia: M79.7

## 2013-07-20 LAB — COMPREHENSIVE METABOLIC PANEL
ALBUMIN: 3.7 g/dL (ref 3.5–5.2)
ALK PHOS: 106 U/L (ref 39–117)
ALT: 10 U/L (ref 0–35)
AST: 15 U/L (ref 0–37)
BILIRUBIN TOTAL: 0.5 mg/dL (ref 0.3–1.2)
BUN: 22 mg/dL (ref 6–23)
CHLORIDE: 103 meq/L (ref 96–112)
CO2: 23 mEq/L (ref 19–32)
Calcium: 9.4 mg/dL (ref 8.4–10.5)
Creatinine, Ser: 1.05 mg/dL (ref 0.50–1.10)
GFR calc Af Amer: 55 mL/min — ABNORMAL LOW (ref >90)
GFR calc non Af Amer: 47 mL/min — ABNORMAL LOW (ref >90)
Glucose, Bld: 111 mg/dL — ABNORMAL HIGH (ref 70–99)
POTASSIUM: 4.4 meq/L (ref 3.7–5.3)
SODIUM: 139 meq/L (ref 137–147)
TOTAL PROTEIN: 6.4 g/dL (ref 6.0–8.3)

## 2013-07-20 LAB — LIPASE, BLOOD: Lipase: 18 U/L (ref 11–59)

## 2013-07-20 LAB — CBC WITH DIFFERENTIAL/PLATELET
Basophils Absolute: 0 10*3/uL (ref 0.0–0.1)
Basophils Relative: 0 % (ref 0–1)
EOS ABS: 0.1 10*3/uL (ref 0.0–0.7)
Eosinophils Relative: 1 % (ref 0–5)
HCT: 31.2 % — ABNORMAL LOW (ref 36.0–46.0)
HEMOGLOBIN: 9.9 g/dL — AB (ref 12.0–15.0)
Lymphocytes Relative: 17 % (ref 12–46)
Lymphs Abs: 1 10*3/uL (ref 0.7–4.0)
MCH: 29.9 pg (ref 26.0–34.0)
MCHC: 31.7 g/dL (ref 30.0–36.0)
MCV: 94.3 fL (ref 78.0–100.0)
MONOS PCT: 6 % (ref 3–12)
Monocytes Absolute: 0.4 10*3/uL (ref 0.1–1.0)
NEUTROS ABS: 4.4 10*3/uL (ref 1.7–7.7)
NEUTROS PCT: 76 % (ref 43–77)
PLATELETS: 132 10*3/uL — AB (ref 150–400)
RBC: 3.31 MIL/uL — AB (ref 3.87–5.11)
RDW: 13.7 % (ref 11.5–15.5)
WBC: 5.8 10*3/uL (ref 4.0–10.5)

## 2013-07-20 LAB — URINALYSIS, ROUTINE W REFLEX MICROSCOPIC
Bilirubin Urine: NEGATIVE
GLUCOSE, UA: NEGATIVE mg/dL
HGB URINE DIPSTICK: NEGATIVE
Ketones, ur: NEGATIVE mg/dL
LEUKOCYTES UA: NEGATIVE
Nitrite: NEGATIVE
PH: 6 (ref 5.0–8.0)
Protein, ur: NEGATIVE mg/dL
Specific Gravity, Urine: 1.011 (ref 1.005–1.030)
Urobilinogen, UA: 0.2 mg/dL (ref 0.0–1.0)

## 2013-07-20 LAB — POC OCCULT BLOOD, ED: FECAL OCCULT BLD: POSITIVE — AB

## 2013-07-20 LAB — I-STAT TROPONIN, ED: Troponin i, poc: 0 ng/mL (ref 0.00–0.08)

## 2013-07-20 LAB — I-STAT CG4 LACTIC ACID, ED: Lactic Acid, Venous: 0.83 mmol/L (ref 0.5–2.2)

## 2013-07-20 LAB — TROPONIN I

## 2013-07-20 MED ORDER — SODIUM CHLORIDE 0.9 % IV SOLN
INTRAVENOUS | Status: DC
  Administered 2013-07-20: 75 mL/h via INTRAVENOUS

## 2013-07-20 MED ORDER — ALBUTEROL SULFATE HFA 108 (90 BASE) MCG/ACT IN AERS: 1.0000 | INHALATION_SPRAY | Freq: Four times a day (QID) | RESPIRATORY_TRACT | Status: DC | PRN

## 2013-07-20 MED ORDER — ASPIRIN EC 81 MG PO TBEC
81.0000 mg | DELAYED_RELEASE_TABLET | Freq: Every day | ORAL | Status: DC
  Administered 2013-07-20 – 2013-07-21 (×2): 81 mg via ORAL
  Filled 2013-07-20 (×2): qty 1

## 2013-07-20 MED ORDER — ALBUTEROL SULFATE (2.5 MG/3ML) 0.083% IN NEBU: 2.5000 mg | INHALATION_SOLUTION | Freq: Four times a day (QID) | RESPIRATORY_TRACT | Status: DC | PRN

## 2013-07-20 MED ORDER — ACETAMINOPHEN 325 MG PO TABS: 650.0000 mg | ORAL_TABLET | Freq: Four times a day (QID) | ORAL | Status: DC | PRN

## 2013-07-20 MED ORDER — ONDANSETRON HCL 4 MG PO TABS
4.0000 mg | ORAL_TABLET | Freq: Four times a day (QID) | ORAL | Status: DC | PRN
Start: 2013-07-20 — End: 2013-07-21

## 2013-07-20 MED ORDER — ENOXAPARIN SODIUM 40 MG/0.4ML ~~LOC~~ SOLN
40.0000 mg | SUBCUTANEOUS | Status: DC
  Administered 2013-07-20: 40 mg via SUBCUTANEOUS
  Filled 2013-07-20 (×2): qty 0.4

## 2013-07-20 MED ORDER — PANTOPRAZOLE SODIUM 40 MG PO TBEC
40.0000 mg | DELAYED_RELEASE_TABLET | Freq: Two times a day (BID) | ORAL | Status: DC
  Administered 2013-07-20 – 2013-07-21 (×2): 40 mg via ORAL
  Filled 2013-07-20 (×3): qty 1

## 2013-07-20 MED ORDER — RISPERIDONE 2 MG PO TABS
4.0000 mg | ORAL_TABLET | Freq: Every day | ORAL | Status: DC
  Administered 2013-07-20 – 2013-07-21 (×2): 4 mg via ORAL
  Filled 2013-07-20 (×2): qty 2

## 2013-07-20 MED ORDER — ACETAMINOPHEN 650 MG RE SUPP: 650.0000 mg | Freq: Four times a day (QID) | RECTAL | Status: DC | PRN

## 2013-07-20 MED ORDER — SODIUM CHLORIDE 0.9 % IV SOLN
INTRAVENOUS | Status: DC
Start: 2013-07-20 — End: 2013-07-21

## 2013-07-20 MED ORDER — GUAIFENESIN ER 600 MG PO TB12
600.0000 mg | ORAL_TABLET | Freq: Two times a day (BID) | ORAL | Status: DC
  Administered 2013-07-20 – 2013-07-21 (×2): 600 mg via ORAL
  Filled 2013-07-20 (×4): qty 1

## 2013-07-20 MED ORDER — ONDANSETRON HCL 4 MG/2ML IJ SOLN
4.0000 mg | Freq: Once | INTRAMUSCULAR | Status: AC
  Administered 2013-07-20: 4 mg via INTRAVENOUS
  Filled 2013-07-20: qty 2

## 2013-07-20 MED ORDER — SERTRALINE HCL 50 MG PO TABS
50.0000 mg | ORAL_TABLET | Freq: Every evening | ORAL | Status: DC
  Administered 2013-07-20: 50 mg via ORAL
  Filled 2013-07-20 (×2): qty 1

## 2013-07-20 MED ORDER — OXYCODONE HCL 5 MG PO TABS: 5.0000 mg | ORAL_TABLET | ORAL | Status: DC | PRN

## 2013-07-20 MED ORDER — IOHEXOL 300 MG/ML  SOLN
25.0000 mL | Freq: Once | INTRAMUSCULAR | Status: AC | PRN
  Administered 2013-07-20: 25 mL via ORAL

## 2013-07-20 MED ORDER — ONDANSETRON HCL 4 MG/2ML IJ SOLN: 4.0000 mg | Freq: Four times a day (QID) | INTRAMUSCULAR | Status: DC | PRN

## 2013-07-20 MED ORDER — SODIUM CHLORIDE 0.9 % IV BOLUS (SEPSIS)
1000.0000 mL | Freq: Once | INTRAVENOUS | Status: AC
  Administered 2013-07-20: 1000 mL via INTRAVENOUS

## 2013-07-20 MED ORDER — MONTELUKAST SODIUM 10 MG PO TABS
10.0000 mg | ORAL_TABLET | Freq: Every day | ORAL | Status: DC
Start: 2013-07-20 — End: 2013-07-21
  Administered 2013-07-20: 10 mg via ORAL
  Filled 2013-07-20 (×3): qty 1

## 2013-07-20 MED ORDER — IOHEXOL 300 MG/ML  SOLN
80.0000 mL | Freq: Once | INTRAMUSCULAR | Status: AC | PRN
Start: 2013-07-20 — End: 2013-07-20
  Administered 2013-07-20: 80 mL via INTRAVENOUS

## 2013-07-20 NOTE — ED Provider Notes (Signed)
CSN: [id number]     Arrival date & time 07/20/13  1040 History   First MD Initiated Contact with Patient 07/20/13 1057     Chief Complaint  Patient presents with  . Abdominal Pain   (Consider location/radiation/quality/duration/timing/severity/associated sxs/prior Treatment) HPI Comments: Patient presents with her granddaughter with reporting a 3 day history of nausea, vomiting, diarrhea, abdominal pain, leg cramps, back pain. States her generalized weakness has become such that she is unable to stand or ambulate unassisted. Reports subjective fever and that she is typically able to ambulate without assistance. Did contact her PCP today and was referred to urgent care for IV fluids (?).   The history is provided by the patient and a relative.    Past Medical History  Diagnosis Date  . Atrial fibrillation   . Palpitations   . Unspecified essential hypertension   . Personal history of other diseases of digestive system   . Other abnormal glucose   . Odynophagia   . Anginal pain     pressure and flutters  . Shortness of breath     with activity  . Pneumonia     hx of  . Arthritis   . Gout   . Cancer     skin R elbow  . Anemia   . Diverticulitis   . Headache(784.0)     not often  . DVT (deep venous thrombosis)     from birth control, left groin  . Cataract   . Hypercholesteremia     controlled  . GERD (gastroesophageal reflux disease)   . Depression   . Asthma     no problems recent   Past Surgical History  Procedure Laterality Date  . Laryngoscopy / bronchoscopy / esophagoscopy  2010    Dr. Benjamine Mola  . Laminectomy  05/2007    foraminotomy, L4-5 laminectomy  . Cholecystectomy  10/2005    Dr. Effie Shy  . Colonoscopy w/ biopsies  2005, 2011    Dr. Bing Plume, "balloon inside for last one at [facility]"  . Skin cancer excision Right ~2005    r elbow  . Tubal ligation    . Appendectomy  ~55years ago  . Abdominal hysterectomy      partial  . Tumor removal Right ~  20 years ago    tumor in between toes  . Knee arthroscopy Right   . Total knee arthroplasty Left 04/04/2013    Procedure: LEFT TOTAL KNEE ARTHROPLASTY;  Surgeon: Tobi Bastos, MD;  Location: [facility];  Service: Orthopedics;  Laterality: Left;   Family History  Problem Relation Age of Onset  . Heart disease Mother   . Diabetes Father    History  Substance Use Topics  . Smoking status: Former Smoker -- 10 years    Types: Cigarettes    Quit date: 02/08/1973  . Smokeless tobacco: Never Used  . Alcohol Use: No   OB History   Grav Para Term Preterm Abortions TAB SAB Ect Mult Living                 Review of Systems  Constitutional: Positive for fever, chills, activity change, appetite change and fatigue.  HENT: Negative.   Eyes: Negative.   Respiratory: Negative.   Cardiovascular: Negative.   Gastrointestinal: Positive for nausea, vomiting, abdominal pain and diarrhea. Negative for constipation and blood in stool.  Endocrine: Negative for polydipsia, polyphagia and polyuria.  Genitourinary: Negative.   Musculoskeletal: Positive for arthralgias, back pain and myalgias.  Skin: Negative.  Neurological: Positive for dizziness, weakness and light-headedness. Negative for syncope.  Psychiatric/Behavioral: Negative for confusion.    Allergies  Amoxicillin; Diltiazem hcl; and Sulfonamide derivatives  Home Medications   Prior to Admission medications   Medication Sig Start Date End Date Taking? Authorizing Provider  albuterol (PROVENTIL HFA;VENTOLIN HFA) 108 (90 BASE) MCG/ACT inhaler Inhale 1 puff into the lungs every 6 (six) hours as needed for wheezing or shortness of breath.    Historical Provider, MD  amLODipine (NORVASC) 5 MG tablet Take 5 mg by mouth every morning.    Historical Provider, MD  aspirin 325 MG tablet Take 325 mg by mouth 2 (two) times daily.    Historical Provider, MD  docusate sodium (COLACE) 100 MG capsule Take 100 mg by mouth 2 (two) times daily.     Historical Provider, MD  ferrous sulfate 325 (65 FE) MG tablet Take 325 mg by mouth every evening.     Historical Provider, MD  FLOVENT HFA 110 MCG/ACT inhaler Inhale 1 puff into the lungs every morning.  02/23/12   Historical Provider, MD  HYDROcodone-acetaminophen (NORCO/VICODIN) 5-325 MG per tablet Take one tablet by mouth every four hours as needed for moderate pain; Take two tablets by mouth every four hours as needed for breakthrough pain 04/09/13   Tiffany L Reed, DO  methocarbamol (ROBAXIN) 500 MG tablet Take 1 tablet (500 mg total) by mouth every 6 (six) hours as needed for muscle spasms. 04/05/13   Amber Renelda Loma, PA-C  montelukast (SINGULAIR) 10 MG tablet Take 10 mg by mouth at bedtime.      Historical Provider, MD  pantoprazole (PROTONIX) 20 MG tablet Take 40 mg by mouth 2 (two) times daily.    Historical Provider, MD  polyethylene glycol (MIRALAX / GLYCOLAX) packet Take 17 g by mouth daily as needed for mild constipation. 04/05/13   Amber Renelda Loma, PA-C  pravastatin (PRAVACHOL) 20 MG tablet Take 20 mg by mouth every evening.     Historical Provider, MD  sertraline (ZOLOFT) 50 MG tablet Take 1 tablet by mouth every evening.  03/15/12   Historical Provider, MD  ULORIC 40 MG tablet Take 1 tablet by mouth daily. 03/15/12   Historical Provider, MD   BP 115/62  Pulse 76  Temp(Src) 99.4 F (37.4 C) (Oral)  Resp 16  SpO2 98% Physical Exam  Nursing note and vitals reviewed. Constitutional: She is oriented to person, place, and time. Vital signs are normal. She appears well-developed and well-nourished. She is cooperative. No distress.  HENT:  Head: Normocephalic and atraumatic.  Mouth/Throat: Mucous membranes are dry.  Eyes: Conjunctivae are normal. No scleral icterus.  Neck: Normal range of motion. Neck supple.  Cardiovascular: Normal rate, regular rhythm and normal heart sounds.   Pulmonary/Chest: Effort normal and breath sounds normal. No respiratory distress.  Abdominal:  Soft. Normal appearance. Bowel sounds are increased. There is generalized tenderness. There is no CVA tenderness.  Musculoskeletal: Normal range of motion.  Neurological: She is alert and oriented to person, place, and time.  Skin: Skin is warm and dry.  Psychiatric: She has a normal mood and affect. Her behavior is normal.    ED Course  Procedures (including critical care time) Labs Review Labs Reviewed - No data to display  Imaging Review No results found.   MDM   1. Gastroenteritis   2. Back pain   3. Weakness   4. Dizziness   5. Dehydration   Elderly female with multiple complaints, possible dehydration, abdominal pain and  new onset back pain with difficulty with ambulation. Will transfer to [facility] for evaluation and treatment.   [place], [place] 07/20/13 1343

## 2013-07-20 NOTE — ED Notes (Signed)
Attempted to ambulate patient again in hallway, patient could only get to chair before stating she was too weak to go further.

## 2013-07-20 NOTE — ED Notes (Signed)
Pt unable to ambulate due to extreme weakness

## 2013-07-20 NOTE — ED Provider Notes (Signed)
CSN: [id number]     Arrival date & time 07/20/13  1213 History   First MD Initiated Contact with Patient 07/20/13 1238     Chief Complaint  Patient presents with  . Abdominal Pain  . Diarrhea  . Emesis     (Consider location/radiation/quality/duration/timing/severity/associated sxs/prior Treatment) HPI Comments: Patient from urgent care with a three-day history of abdominal pain, dizziness, nausea, vomiting and diarrhea. She states she's felt lightheaded and off balance when she gets up for the past 3 days which is associated with 4-5 episodes of loose nonbloody stools a day. This morning she developed vomiting x2. She denies any fever. Denies any dysuria hematuria. She is crampy left lower bowel and flank pain. Denies any sick contacts. Denies any recent travel or antibiotic use. Denies any chest pain or shortness of breath. Denies any vertigo. No focal weakness or tingling.  The history is provided by the patient and a caregiver.    Past Medical History  Diagnosis Date  . Atrial fibrillation   . Palpitations   . Unspecified essential hypertension   . Personal history of other diseases of digestive system   . Other abnormal glucose   . Odynophagia   . Anginal pain     pressure and flutters  . Shortness of breath     with activity  . Pneumonia     hx of  . Arthritis   . Gout   . Diverticulitis   . DVT (deep venous thrombosis)     from birth control, left groin  . Cataract   . Hypercholesteremia     controlled  . GERD (gastroesophageal reflux disease)   . Depression   . Asthma     no problems recent  . Family history of anesthesia complication 30 y.a.    nausea and vomiting (son)  . Headache(784.0)     migraines "as a girl" (not in > 40 yrs)  . Cancer 2014    skin R elbow & face  . Anemia     "I stay anemic"  . Fibromyalgia 12 y.a.  . Blood dyscrasia age 21    blood clot in groin, PE, "was taking hormones"   Past Surgical History  Procedure Laterality Date  .  Laryngoscopy / bronchoscopy / esophagoscopy  2010    Dr. Benjamine Mola  . Laminectomy  05/2007    foraminotomy, L4-5 laminectomy  . Cholecystectomy  10/2005    Dr. Effie Shy  . Colonoscopy w/ biopsies  2005, 2011    Dr. Bing Plume, "balloon inside for last one at [facility]"  . Skin cancer excision Right ~2005    r elbow  . Tubal ligation    . Appendectomy  ~55years ago  . Abdominal hysterectomy      partial  . Tumor removal Right ~ 20 years ago    tumor in between toes  . Knee arthroscopy Right   . Total knee arthroplasty Left 04/04/2013    Procedure: LEFT TOTAL KNEE ARTHROPLASTY;  Surgeon: Tobi Bastos, MD;  Location: [facility];  Service: Orthopedics;  Laterality: Left;   Family History  Problem Relation Age of Onset  . Heart disease Mother   . Diabetes Father   . Stroke Father   . Cancer Sister    History  Substance Use Topics  . Smoking status: Former Smoker -- 10 years    Types: Cigarettes    Quit date: 02/08/1973  . Smokeless tobacco: Never Used  . Alcohol Use: No   OB History  Grav Para Term Preterm Abortions TAB SAB Ect Mult Living                 Review of Systems  Constitutional: Positive for fever, activity change, appetite change and fatigue.  HENT: Negative for congestion.   Respiratory: Negative for cough, chest tightness and shortness of breath.   Cardiovascular: Negative for chest pain.  Gastrointestinal: Positive for vomiting, abdominal pain and diarrhea.  Genitourinary: Negative for dysuria, vaginal bleeding, vaginal discharge and menstrual problem.  Musculoskeletal: Negative for arthralgias, back pain and myalgias.  Skin: Negative for rash.  Neurological: Positive for weakness. Negative for dizziness and headaches.  A complete 10 system review of systems was obtained and all systems are negative except as noted in the HPI and PMH.      Allergies  Amoxicillin; Diltiazem hcl; Sulfonamide derivatives; and Zetia  Home Medications   Prior to  Admission medications   Medication Sig Start Date End Date Taking? Authorizing Provider  albuterol (PROVENTIL HFA;VENTOLIN HFA) 108 (90 BASE) MCG/ACT inhaler Inhale 1 puff into the lungs every 6 (six) hours as needed for wheezing or shortness of breath.   Yes Historical Provider, MD  amLODipine (NORVASC) 5 MG tablet Take 5 mg by mouth every morning.   Yes Historical Provider, MD  aspirin EC 81 MG tablet Take 81 mg by mouth daily.   Yes Historical Provider, MD  colchicine 0.6 MG tablet Take 1 tablet by mouth every evening. 04/15/13  Yes Historical Provider, MD  HYDROcodone-acetaminophen (NORCO/VICODIN) 5-325 MG per tablet Take 1-2 tablets by mouth every 4 (four) hours as needed for moderate pain.   Yes Historical Provider, MD  montelukast (SINGULAIR) 10 MG tablet Take 10 mg by mouth at bedtime.     Yes Historical Provider, MD  ondansetron (ZOFRAN) 4 MG tablet Take 4 mg by mouth every 8 (eight) hours as needed. For nausea 05/21/13  Yes Historical Provider, MD  pantoprazole (PROTONIX) 20 MG tablet Take 40 mg by mouth 2 (two) times daily.   Yes Historical Provider, MD  pravastatin (PRAVACHOL) 20 MG tablet Take 20 mg by mouth every evening.    Yes Historical Provider, MD  risperidone (RISPERDAL) 4 MG tablet Take 4 mg by mouth daily. 07/12/13  Yes Historical Provider, MD  sertraline (ZOLOFT) 50 MG tablet Take 1 tablet by mouth every evening.  03/15/12  Yes Historical Provider, MD  calcium carbonate (TUMS - DOSED IN MG ELEMENTAL CALCIUM) 500 MG chewable tablet Chew 1 tablet by mouth daily as needed for indigestion or heartburn.    Historical Provider, MD  ferrous sulfate 325 (65 FE) MG tablet Take 325 mg by mouth every evening.     Historical Provider, MD   BP 110/44  Pulse 63  Temp(Src) 99 F (37.2 C) (Oral)  Resp 20  Ht 5\' 2"  (1.575 m)  Wt 174 lb 9.7 oz (79.2 kg)  BMI 31.93 kg/m2  SpO2 92% Physical Exam  Constitutional: She is oriented to person, place, and time. She appears well-developed and  well-nourished. No distress.  HENT:  Head: Normocephalic and atraumatic.  Mouth/Throat: Oropharynx is clear and moist. No oropharyngeal exudate.  Eyes: Conjunctivae and EOM are normal. Pupils are equal, round, and reactive to light.  Neck: Normal range of motion. Neck supple.  Cardiovascular: Normal rate, regular rhythm and normal heart sounds.   No murmur heard. Pulmonary/Chest: Effort normal and breath sounds normal. No respiratory distress.  Abdominal: Soft. There is tenderness. There is guarding. There is no rebound.  TTP  LLQ with guarding  Genitourinary:  Dark stool, no gross blood  Musculoskeletal: Normal range of motion. She exhibits no edema and no tenderness.  Neurological: She is alert and oriented to person, place, and time. No cranial nerve deficit. She exhibits normal muscle tone. Coordination normal.  Skin: Skin is warm.    ED Course  Procedures (including critical care time) Labs Review Labs Reviewed  CBC WITH DIFFERENTIAL - Abnormal; Notable for the following:    RBC 3.31 (*)    Hemoglobin 9.9 (*)    HCT 31.2 (*)    Platelets 132 (*)    All other components within normal limits  COMPREHENSIVE METABOLIC PANEL - Abnormal; Notable for the following:    Glucose, Bld 111 (*)    GFR calc non Af Amer 47 (*)    GFR calc Af Amer 55 (*)    All other components within normal limits  POC OCCULT BLOOD, ED - Abnormal; Notable for the following:    Fecal Occult Bld POSITIVE (*)    All other components within normal limits  LIPASE, BLOOD  URINALYSIS, ROUTINE W REFLEX MICROSCOPIC  TROPONIN I  BASIC METABOLIC PANEL  CBC  I-STAT TROPOININ, ED  I-STAT CG4 LACTIC ACID, ED    Imaging Review Ct Abdomen Pelvis W Contrast  07/20/2013   CLINICAL DATA:  Abdominal pain since last night after eating, diarrhea and dizziness for 3 days, vomiting, history unspecified essential hypertension, atrial fibrillation, asthma, DVT,  EXAM: CT ABDOMEN AND PELVIS WITH CONTRAST  TECHNIQUE:  Multidetector CT imaging of the abdomen and pelvis was performed using the standard protocol following bolus administration of intravenous contrast. Sagittal and coronal MPR images reconstructed from axial data set.  CONTRAST:  23mL OMNIPAQUE IOHEXOL 300 MG/ML SOLN IV. No oral contrast administered.  COMPARISON:  None.  FINDINGS: Minimal atelectasis at lung bases.  RIGHT renal cysts, largest 2.9 x 2.1 cm image 32.  Post cholecystectomy, hysterectomy, appendectomy.  Splenic enlargement, 13.3 cm length with calculated volume 674 mL.  Liver, spleen, pancreas, kidneys, and adrenal glands otherwise normal appearance.  Diverticulosis of distal descending and sigmoid colon without definite wall thickening or pericolic inflammatory changes to suggest acute diverticulitis.  Tiny amount of nonspecific free pelvic fluid.  Tiny cyst RIGHT ovary.  Stomach and bowel loops otherwise normal appearance for exam lacking GI contrast.  Scattered atherosclerotic calcifications aorta and iliac arteries.  No mass, adenopathy, free fluid, free air, or inflammatory process.  Bones demineralized with old RIGHT pubic rami fractures noted.  IMPRESSION: Small RIGHT renal cysts.  Splenomegaly.  Distal colonic diverticulosis without evidence of diverticulitis.  No definite acute intra-abdominal or intrapelvic abnormalities.   Electronically Signed   By: Lavonia Dana M.D.   On: 07/20/2013 16:02   Dg Abd Acute W/chest  07/20/2013   CLINICAL DATA:  Abdominal pain. Dizziness. Generalize weakness. Diarrhea. Nausea and vomiting.  EXAM: ACUTE ABDOMEN SERIES (ABDOMEN 2 VIEW & CHEST 1 VIEW)  COMPARISON:  CT abdomen and pelvis 10/02/2010. Two-view chest x-ray 04/02/2013, 06/20/2010.  FINDINGS: Mild gaseous distension of several loops of small bowel in the left mid abdomen and left upper pelvis, demonstrating air-fluid levels on the erect image. No evidence of free intraperitoneal air. Expected stool burden within the colon, with scattered colonic  air-fluid levels consistent liquid stool. Surgical clips in the right upper quadrant from prior cholecystectomy. Prominent Riedel's lobe of liver as noted on the prior CT. Splenomegaly, as the spleen tip extends below the costal margin, probably not significantly changed since  the prior CT. Phleboliths in both sides of the pelvis. No visible opaque urinary tract calculi. Arterial calcifications involving the splenic artery in the left upper quadrant as noted previously.  Cardiac silhouette upper normal in size, unchanged. Thoracic aorta atherosclerotic, unchanged. Hilar and mediastinal contours otherwise unremarkable. Linear scarring at the left lung base, unchanged. New linear opacities at the right lung base since the February, 2015 examination. Lungs otherwise clear.  IMPRESSION: 1. Partial small bowel obstruction. 2. No free intraperitoneal air. 3. Splenomegaly. 4. Linear atelectasis in the right lung base, new since the most recent previous examination. Stable linear scarring at the left lung base. No acute cardiopulmonary disease otherwise.   Electronically Signed   By: Evangeline Dakin M.D.   On: 07/20/2013 13:48     EKG Interpretation   Date/Time:  Friday July 20 2013 16:44:20 EDT Ventricular Rate:  68 PR Interval:  144 QRS Duration: 95 QT Interval:  421 QTC Calculation: 448 R Axis:   -65 Text Interpretation:  Sinus rhythm Left anterior fascicular block Abnormal  R-wave progression, late transition No significant change was found  Confirmed by Wyvonnia Dusky  MD, [place] [phone]) on 07/20/2013 4:52:09 PM      MDM   Final diagnoses:  Gastroenteritis  Melena   3 day history of abdominal pain, diarrhea nausea with decreased by mouth intake and lightheadedness.  Urinalysis is negative. Fecal occult blood was positive. X-ray shows partial small bowel obstruction. Hemoglobin is 9.9 stable. Labs otherwise unremarkable  Lactate normal. Patient's x-ray results were discussed with Dr. Brantley Stage  surgery. He will have someone to evaluate. However CT does not confirm bowel obstruction. No acute abnormal is on CT. No diverticulitis.  Patient has tolerated by mouth in the ED. He does have guaiac positive stools that are dark in color and is on iron supplements. Her hemoglobin is stable if not improved from baseline. Patient remains weak and has difficulty walking though is tolerating PO.  Will observe overnight.  D/w Dr. [place] Pink.  Ezequiel Essex, MD 07/20/13 2235

## 2013-07-20 NOTE — ED Notes (Signed)
Sent from urgent care for further treatment and possible dehydration

## 2013-07-20 NOTE — H&P (Signed)
[facility] History and Physical  [name] [id number] DOB: Jun 20, 1927 DOA: 07/20/2013  Referring physician: Charolotte Capuchin, ER physician PCP: Antony Blackbird, MD   Chief Complaint: Weakness  HPI: [name] is a 80 y.o. female  Past medical history paroxysmal atrial fibrillation, hypertension and intermittent chronic diarrhea 2 in the last 24 hour started having problems with cramping lower Cordran abdominal pain as well as nausea and vomiting. It got to the point where patient could not keep anything down and this morning was throwing up walker. She felt very weak and could barely stand she came into the emergency room. Lab work was unremarkable.  However, an abdominal x-ray noted partial small bowel obstruction. A CT scan was done to further evaluate, however the CT scan was actually normal. Patient's symptoms improved with medication for nausea and pain and IV fluids. Hospitalist were called for further evaluation and admission. Patient was transferred to the floor and by that time was hungry and asking for food.   Review of Systems:  Patient seen after arrival to floor. Doing okay. Hungry. Denies any headaches, vision changes, chest pain or palpitations. No shortness of breath. She does get some episodes of dysphagia and chokes when she eats. Denies any wheezing. She does complain of a cough which she feels she cannot get any phlegm up because of the choking. Abdominal pain earlier generalized in the lower cords which has since resolved. Described as cramping. Denies any constipation. Has diarrhea intermittently ongoing for years, no constipation. Denies any focal extremity numbness or weakness or pain, she is problems with her left knee and is now status post total knee replacement. Mildly nauseated earlier. Better. Her review systems otherwise negative. Past Medical History  Diagnosis Date  . Atrial fibrillation   . Palpitations   . Unspecified essential hypertension     . Personal history of other diseases of digestive system   . Other abnormal glucose   . Odynophagia   . Anginal pain     pressure and flutters  . Shortness of breath     with activity  . Pneumonia     hx of  . Arthritis   . Gout   . Diverticulitis   . DVT (deep venous thrombosis)     from birth control, left groin  . Cataract   . Hypercholesteremia     controlled  . GERD (gastroesophageal reflux disease)   . Depression   . Asthma     no problems recent  . Family history of anesthesia complication 30 y.a.    nausea and vomiting (son)  . Headache(784.0)     migraines "as a girl" (not in > 40 yrs)  . Cancer 2014    skin R elbow & face  . Anemia     "I stay anemic"  . Fibromyalgia 10 y.a.  . Blood dyscrasia age 14    blood clot in groin, PE, "was taking hormones"   Past Surgical History  Procedure Laterality Date  . Laryngoscopy / bronchoscopy / esophagoscopy  2010    Dr. Benjamine Mola  . Laminectomy  05/2007    foraminotomy, L4-5 laminectomy  . Cholecystectomy  10/2005    Dr. Effie Shy  . Colonoscopy w/ biopsies  2005, 2011    Dr. Bing Plume, "balloon inside for last one at [facility]"  . Skin cancer excision Right ~2005    r elbow  . Tubal ligation    . Appendectomy  ~55years ago  . Abdominal hysterectomy  partial  . Tumor removal Right ~ 20 years ago    tumor in between toes  . Knee arthroscopy Right   . Total knee arthroplasty Left 04/04/2013    Procedure: LEFT TOTAL KNEE ARTHROPLASTY;  Surgeon: Tobi Bastos, MD;  Location: [facility];  Service: Orthopedics;  Laterality: Left;   Social History:  reports that she quit smoking about 40 years ago. Her smoking use included Cigarettes. She smoked 0.00 packs per day for 40 years. She has never used smokeless tobacco. She reports that she does not drink alcohol or use illicit drugs.  patient lives at home with her daughter. She is able to ambulate quite well using a walker  Allergies  Allergen Reactions  .  Amoxicillin Other (See Comments)    Very weak  . Diltiazem Hcl Other (See Comments)    Reaction unknown  . Sulfonamide Derivatives Other (See Comments)    Reaction unknown  . Zetia [Ezetimibe] Other (See Comments)    Weakness    Family History  Problem Relation Age of Onset  . Heart disease Mother   . Diabetes Father   . Stroke Father   . Cancer Sister      Prior to Admission medications   Medication Sig Start Date End Date Taking? Authorizing Provider  albuterol (PROVENTIL HFA;VENTOLIN HFA) 108 (90 BASE) MCG/ACT inhaler Inhale 1 puff into the lungs every 6 (six) hours as needed for wheezing or shortness of breath.   Yes Historical Provider, MD  amLODipine (NORVASC) 5 MG tablet Take 5 mg by mouth every morning.   Yes Historical Provider, MD  aspirin EC 81 MG tablet Take 81 mg by mouth daily.   Yes Historical Provider, MD  colchicine 0.6 MG tablet Take 1 tablet by mouth every evening. 04/15/13  Yes Historical Provider, MD  HYDROcodone-acetaminophen (NORCO/VICODIN) 5-325 MG per tablet Take 1-2 tablets by mouth every 4 (four) hours as needed for moderate pain.   Yes Historical Provider, MD  montelukast (SINGULAIR) 10 MG tablet Take 10 mg by mouth at bedtime.     Yes Historical Provider, MD  ondansetron (ZOFRAN) 4 MG tablet Take 4 mg by mouth every 8 (eight) hours as needed. For nausea 05/21/13  Yes Historical Provider, MD  pantoprazole (PROTONIX) 20 MG tablet Take 40 mg by mouth 2 (two) times daily.   Yes Historical Provider, MD  pravastatin (PRAVACHOL) 20 MG tablet Take 20 mg by mouth every evening.    Yes Historical Provider, MD  risperidone (RISPERDAL) 4 MG tablet Take 4 mg by mouth daily. 07/12/13  Yes Historical Provider, MD  sertraline (ZOLOFT) 50 MG tablet Take 1 tablet by mouth every evening.  03/15/12  Yes Historical Provider, MD  calcium carbonate (TUMS - DOSED IN MG ELEMENTAL CALCIUM) 500 MG chewable tablet Chew 1 tablet by mouth daily as needed for indigestion or heartburn.     Historical Provider, MD  ferrous sulfate 325 (65 FE) MG tablet Take 325 mg by mouth every evening.     Historical Provider, MD   Physical Exam: Filed Vitals:   07/20/13 1822  BP: 132/51  Pulse: 67  Temp: 98.6 F (37 C)  Resp: 16    BP 132/51  Pulse 67  Temp(Src) 98.6 F (37 C) (Oral)  Resp 16  Ht 5\' 2"  (1.575 m)  Wt 79.2 kg (174 lb 9.7 oz)  BMI 31.93 kg/m2  SpO2 96%  General: Alert and oriented x3, no acute distress Eyes: Sclera nonicteric, extraocular movements are intact ENT: Normocephalic, atraumatic,  mucous membranes are slightly dry Neck: No JVD Cardiovascular: Regular rate and rhythm, S1-S2 Respiratory: Clear to auscultation bilaterally Abdomen: Soft, nontender, nondistended, positive bowel sounds Skin: No skin breaks, tears or lesions Musculoskeletal: No clubbing or cyanosis, trace edema Psychiatric: Patient is appropriate, no evidence of psychoses Neurologic: No focal deficits           Labs on Admission:  Basic Metabolic Panel:  Recent Labs Lab 07/20/13 1304  NA 139  K 4.4  CL 103  CO2 23  GLUCOSE 111*  BUN 22  CREATININE 1.05  CALCIUM 9.4   Liver Function Tests:  Recent Labs Lab 07/20/13 1304  AST 15  ALT 10  ALKPHOS 106  BILITOT 0.5  PROT 6.4  ALBUMIN 3.7    Recent Labs Lab 07/20/13 1304  LIPASE 18   No results found for this basename: AMMONIA,  in the last 168 hours CBC:  Recent Labs Lab 07/20/13 1304  WBC 5.8  NEUTROABS 4.4  HGB 9.9*  HCT 31.2*  MCV 94.3  PLT 132*   Cardiac Enzymes:  Recent Labs Lab 07/20/13 1304  TROPONINI <0.30    BNP (last 3 results) No results found for this basename: PROBNP,  in the last 8760 hours CBG: No results found for this basename: GLUCAP,  in the last 168 hours  Radiological Exams on Admission: Ct Abdomen Pelvis W Contrast  07/20/2013   CLINICAL DATA:  Abdominal pain since last night after eating, diarrhea and dizziness for 3 days, vomiting, history unspecified essential  hypertension, atrial fibrillation, asthma, DVT,  EXAM: CT ABDOMEN AND PELVIS WITH CONTRAST  TECHNIQUE: Multidetector CT imaging of the abdomen and pelvis was performed using the standard protocol following bolus administration of intravenous contrast. Sagittal and coronal MPR images reconstructed from axial data set.  CONTRAST:  38mL OMNIPAQUE IOHEXOL 300 MG/ML SOLN IV. No oral contrast administered.  COMPARISON:  None.  FINDINGS: Minimal atelectasis at lung bases.  RIGHT renal cysts, largest 2.9 x 2.1 cm image 32.  Post cholecystectomy, hysterectomy, appendectomy.  Splenic enlargement, 13.3 cm length with calculated volume 674 mL.  Liver, spleen, pancreas, kidneys, and adrenal glands otherwise normal appearance.  Diverticulosis of distal descending and sigmoid colon without definite wall thickening or pericolic inflammatory changes to suggest acute diverticulitis.  Tiny amount of nonspecific free pelvic fluid.  Tiny cyst RIGHT ovary.  Stomach and bowel loops otherwise normal appearance for exam lacking GI contrast.  Scattered atherosclerotic calcifications aorta and iliac arteries.  No mass, adenopathy, free fluid, free air, or inflammatory process.  Bones demineralized with old RIGHT pubic rami fractures noted.  IMPRESSION: Small RIGHT renal cysts.  Splenomegaly.  Distal colonic diverticulosis without evidence of diverticulitis.  No definite acute intra-abdominal or intrapelvic abnormalities.   Electronically Signed   By: Lavonia Dana M.D.   On: 07/20/2013 16:02   Dg Abd Acute W/chest  07/20/2013   CLINICAL DATA:  Abdominal pain. Dizziness. Generalize weakness. Diarrhea. Nausea and vomiting.  EXAM: ACUTE ABDOMEN SERIES (ABDOMEN 2 VIEW & CHEST 1 VIEW)  COMPARISON:  CT abdomen and pelvis 10/02/2010. Two-view chest x-ray 04/02/2013, 06/20/2010.  FINDINGS: Mild gaseous distension of several loops of small bowel in the left mid abdomen and left upper pelvis, demonstrating air-fluid levels on the erect image. No  evidence of free intraperitoneal air. Expected stool burden within the colon, with scattered colonic air-fluid levels consistent liquid stool. Surgical clips in the right upper quadrant from prior cholecystectomy. Prominent Riedel's lobe of liver as noted on the prior CT.  Splenomegaly, as the spleen tip extends below the costal margin, probably not significantly changed since the prior CT. Phleboliths in both sides of the pelvis. No visible opaque urinary tract calculi. Arterial calcifications involving the splenic artery in the left upper quadrant as noted previously.  Cardiac silhouette upper normal in size, unchanged. Thoracic aorta atherosclerotic, unchanged. Hilar and mediastinal contours otherwise unremarkable. Linear scarring at the left lung base, unchanged. New linear opacities at the right lung base since the February, 2015 examination. Lungs otherwise clear.  IMPRESSION: 1. Partial small bowel obstruction. 2. No free intraperitoneal air. 3. Splenomegaly. 4. Linear atelectasis in the right lung base, new since the most recent previous examination. Stable linear scarring at the left lung base. No acute cardiopulmonary disease otherwise.   Electronically Signed   By: Evangeline Dakin M.D.   On: 07/20/2013 13:48    EKG: Independently reviewed. Sinus rhythm with left anterior fascicular block and poor R-wave progression. No change from previous x-rays  Assessment/Plan Principal Problem:   Nausea & vomiting: Quickly resolving small bowel obstruction versus gastroenteritis. Symptomatic control plus slowly advancing diet Active Problems:   PAROXYSMAL ATRIAL FIBRILLATION: Stable. Rate controlled. Not on anticoagulation.   Pulmonary hypertension   Hypercholesteremia   GERD (gastroesophageal reflux disease): Continue PPI.    Depression: Continue SSRI.    Gastroenteritis: Suspect cause of nausea and vomiting. Appears to be resolving. Symptomatic treatment. Slowly dance diet from clear liquids.     Abdominal pain: Suspect gastroenteritis.    Abnormal x-ray of abdomen: Possibly this could very brief small bowel obstruction. CT scan negative. Advance diet monitor.    Diarrhea: Chronic problem. Patient states that this is been going on for years. No signs of any infectious etiology.    Generalized weakness: Patient's baseline is that she is ambulatory, occasionally using a walker. Likely from dehydration she is feeling weaker. Will ambulate in the hallway tomorrow.  Dysphagia: Patient reports often getting choked up when she eats. She thought this might be related to allergies? With have speech therapy check swallow eval   obesity: Patient is criteria with BMI greater then 30     Code Status: DO NOT RESUSCITATE  Family Communication: Patient's son at the bedside.  Disposition Plan: Possibly home tomorrow  Time spent: 25 minutes  Annita Brod [facility] Pager [phone]  **Disclaimer: This note may have been dictated with voice recognition software. Similar sounding words can inadvertently be transcribed and this note may contain transcription errors which may not have been corrected upon publication of note.**

## 2013-07-20 NOTE — ED Notes (Signed)
ATTEMPTED TO CALL REPORT

## 2013-07-20 NOTE — ED Notes (Signed)
C/o abd pain States on Wednesday she had dizzy sx On Thursday she started feeling better but then she vomit and had diarrhea States she is having leg and back pain States when she eats she has abd pain Today patient has vomit twice

## 2013-07-20 NOTE — ED Provider Notes (Signed)
Medical screening examination/treatment/procedure(s) were performed by non-physician practitioner and as supervising physician I was immediately available for consultation/collaboration.  Philipp Deputy, M.D.  Harden Mo, MD 07/20/13 2219

## 2013-07-20 NOTE — ED Notes (Signed)
ATTEMPTED TO CALL REPORT.

## 2013-07-20 NOTE — ED Notes (Signed)
Pt is here with upper abdominal since last nite after eating, diarrhea for 3 days, and has vomited.  Pt reports some dizziness for three days

## 2013-07-21 DIAGNOSIS — I10 Essential (primary) hypertension: Secondary | ICD-10-CM | POA: Diagnosis not present

## 2013-07-21 DIAGNOSIS — R109 Unspecified abdominal pain: Secondary | ICD-10-CM | POA: Diagnosis not present

## 2013-07-21 DIAGNOSIS — K5289 Other specified noninfective gastroenteritis and colitis: Secondary | ICD-10-CM | POA: Diagnosis not present

## 2013-07-21 DIAGNOSIS — R112 Nausea with vomiting, unspecified: Secondary | ICD-10-CM | POA: Diagnosis not present

## 2013-07-21 LAB — CBC
HCT: 26.4 % — ABNORMAL LOW (ref 36.0–46.0)
HEMOGLOBIN: 8.3 g/dL — AB (ref 12.0–15.0)
MCH: 29.9 pg (ref 26.0–34.0)
MCHC: 31.4 g/dL (ref 30.0–36.0)
MCV: 95 fL (ref 78.0–100.0)
Platelets: 128 10*3/uL — ABNORMAL LOW (ref 150–400)
RBC: 2.78 MIL/uL — ABNORMAL LOW (ref 3.87–5.11)
RDW: 13.8 % (ref 11.5–15.5)
WBC: 3.2 10*3/uL — ABNORMAL LOW (ref 4.0–10.5)

## 2013-07-21 LAB — BASIC METABOLIC PANEL
BUN: 17 mg/dL (ref 6–23)
CHLORIDE: 105 meq/L (ref 96–112)
CO2: 24 mEq/L (ref 19–32)
Calcium: 9 mg/dL (ref 8.4–10.5)
Creatinine, Ser: 1.05 mg/dL (ref 0.50–1.10)
GFR calc non Af Amer: 47 mL/min — ABNORMAL LOW (ref >90)
GFR, EST AFRICAN AMERICAN: 55 mL/min — AB (ref >90)
Glucose, Bld: 92 mg/dL (ref 70–99)
POTASSIUM: 4.1 meq/L (ref 3.7–5.3)
Sodium: 142 mEq/L (ref 137–147)

## 2013-07-21 NOTE — Evaluation (Signed)
Clinical/Bedside Swallow Evaluation Patient Details  Name: [name] MRN: [id number] Date of Birth: 07/16/1927  Today's Date: 07/21/2013 Time: 0935-1000 SLP Time Calculation (min): 25 min  Past Medical History:  Past Medical History  Diagnosis Date  . Atrial fibrillation   . Palpitations   . Unspecified essential hypertension   . Personal history of other diseases of digestive system   . Other abnormal glucose   . Odynophagia   . Anginal pain     pressure and flutters  . Shortness of breath     with activity  . Pneumonia     hx of  . Arthritis   . Gout   . Diverticulitis   . DVT (deep venous thrombosis)     from birth control, left groin  . Cataract   . Hypercholesteremia     controlled  . GERD (gastroesophageal reflux disease)   . Depression   . Asthma     no problems recent  . Family history of anesthesia complication 30 y.a.    nausea and vomiting (son)  . Headache(784.0)     migraines "as a girl" (not in > 40 yrs)  . Cancer 2014    skin R elbow & face  . Anemia     "I stay anemic"  . Fibromyalgia 12 y.a.  . Blood dyscrasia age 41    blood clot in groin, PE, "was taking hormones"   Past Surgical History:  Past Surgical History  Procedure Laterality Date  . Laryngoscopy / bronchoscopy / esophagoscopy  2010    Dr. Benjamine Mola  . Laminectomy  05/2007    foraminotomy, L4-5 laminectomy  . Cholecystectomy  10/2005    Dr. Effie Shy  . Colonoscopy w/ biopsies  2005, 2011    Dr. Bing Plume, "balloon inside for last one at [facility]"  . Skin cancer excision Right ~2005    r elbow  . Tubal ligation    . Appendectomy  ~55years ago  . Abdominal hysterectomy      partial  . Tumor removal Right ~ 20 years ago    tumor in between toes  . Knee arthroscopy Right   . Total knee arthroplasty Left 04/04/2013    Procedure: LEFT TOTAL KNEE ARTHROPLASTY;  Surgeon: Tobi Bastos, MD;  Location: [facility];  Service: Orthopedics;  Laterality: Left;   HPI:  Pt is an 80  yo female admitted d/t nausea, vomiting and diarrhea; pt stated she had surgery approximately 5 yrs ago and they "went down my throat 2x after I swallowed a pill the wrong way."  Pt denies any hx of pneumonia and/or dysphagia, but did say she "coughs in the morning and at night often."  Pt has a pmhx significant for AFib and HTN.    Assessment / Plan / Recommendation Clinical Impression  Pt without overt s/s of aspiration noted during BSE; oropharyngeal swallow appears within functional limits; no f/u ST recommended; upgrade to regular/thin as tolerated; general swallowing precautions recommended (i.e: slow rate, small bites/sips, upright 90 degrees)    Aspiration Risk  None    Diet Recommendation Regular;Thin liquid (as tolerated)   Liquid Administration via: Cup;Straw Medication Administration: Whole meds with liquid Supervision: Patient able to self feed Compensations: Slow rate;Small sips/bites Postural Changes and/or Swallow Maneuvers: Seated upright 90 degrees;Upright 30-60 min after meal    Other  Recommendations   n/a  Follow Up Recommendations  None    Frequency and Duration   n/a     Pertinent Vitals/Pain WDL  SLP Swallow Goals  n/a   Swallow Study Prior Functional Status   Independent at home    General Date of Onset: 07/20/13 HPI: Pt is an 80 yo female admitted d/t nausea, vomiting and diarrhea; pt stated she had surgery approximately 5 yrs ago and they "went down my throat 2x after I swallowed a pill the wrong way."  Pt denies any hx of pneumonia and/or dysphagia, but did say she "coughs in the morning and at night often."  Pt has a pmhx significant for AFib and HTN.  Type of Study: Bedside swallow evaluation Previous Swallow Assessment: n/a Diet Prior to this Study: Thin liquids;Other (Comment) (full liquids) Temperature Spikes Noted: No Respiratory Status: Room air Behavior/Cognition: Alert;Cooperative;Pleasant mood Oral Cavity - Dentition: Adequate natural  dentition Self-Feeding Abilities: Able to feed self Patient Positioning: Upright in chair Baseline Vocal Quality: Clear;Hoarse Volitional Cough: Strong Volitional Swallow: Able to elicit    Oral/Motor/Sensory Function Overall Oral Motor/Sensory Function: Appears within functional limits for tasks assessed Labial ROM: Within Functional Limits Labial Symmetry: Within Functional Limits Labial Strength: Within Functional Limits Labial Sensation: Within Functional Limits Lingual ROM: Within Functional Limits Lingual Symmetry: Within Functional Limits Lingual Strength: Within Functional Limits Lingual Sensation: Within Functional Limits Facial ROM: Within Functional Limits Facial Symmetry: Within Functional Limits Facial Strength: Within Functional Limits Facial Sensation: Within Functional Limits Velum: Within Functional Limits Mandible: Within Functional Limits   Ice Chips Ice chips: Not tested   Thin Liquid Thin Liquid: Within functional limits Presentation: Cup;Straw    Nectar Thick Nectar Thick Liquid: Not tested   Honey Thick Honey Thick Liquid: Not tested   Puree Puree: Within functional limits Presentation: Self Fed   Solid    Functional Assessment Tool Used: NOMS Functional Limitations: Swallowing Swallow Current Status [id number]): 0 percent impaired, limited or restricted Swallow Goal Status [id number]): 0 percent impaired, limited or restricted Swallow Discharge Status [phone]): 0 percent impaired, limited or restricted  Solid: Within functional limits Presentation: Self Fed       Abuk Selleck,PAT, M.S., CCC-SLP 07/21/2013,12:07 PM

## 2013-07-21 NOTE — Discharge Summary (Signed)
Physician Discharge Summary  [name] [id number] DOB: 14-Sep-1927 DOA: 07/20/2013  PCP: Antony Blackbird, MD  Admit date: 07/20/2013 Discharge date: 07/21/2013  Recommendations for Outpatient Follow-up:  1. Pt will need to follow up with PCP in 2 weeks post discharge 2. Please obtain BMP  3. Please also check CBC  4. Please followup with blood pressure as the patient's amlodipine has been discontinued   Discharge Diagnoses:  recurrent vomiting -Quickly resolving small bowel obstruction versus gastroenteritis -Although 07/20/13 acute abdominal series revealed a possible partial small bowel obstruction, CT abdomen and pelvis later on 07/20/2013 was negative for any acute abnormalities -The patient improved clinically within less than 24 hours -The patient felt hungry and wanted to eat.  The patient did not have any vomiting since admission -Patient's diet was  advanced and she tolerated it-infection had been eating 100% of her meals Diarrhea -This has been a chronic problem going on for years -Patient states that she alternates between constipation and diarrhea -Suspect a degree of irritable bowel syndrome no diarrhea since admission to the hospital -Patient did not have any bowel movements during the hospitalization, but she did pass flatus Generalized weakness  -Likely from volume depletion  -Improved with IV fluids  -UA negative for pyuria -PT evaluation--did not feel that she needed any further PT followup Dysphagia -speech evaluation recommends regular diet with thin liquids abdominal pain  -Likely gastroenteritis  -Improving on the day of discharge -Ate 100% of her meals without worsening pain  HTN -The patient amlodipine was discontinued during the admission  -The patient's blood pressure remains stable with systolic blood pressure [id number]  -The patient was instructed to stop taking her amlodipine and to follow up with her primary care provider to determine the need  to restart any antihypertensive medications  Depression -continue zoloft History of PAF -Patient remained in sinus rhythm -Continue aspirin -Not anticoagulation candidate due to recurrent falls Discharge Condition: stable  Disposition:  Follow-up Information   Follow up with FULP, CAMMIE, MD In 1 week.   Specialty:  Family Medicine   Contact information:   [place] [address] [phone]     home  Diet:heart healthy Wt Readings from Last 3 Encounters:  07/20/13 79.2 kg (174 lb 9.7 oz)  04/09/13 77.111 kg (170 lb)  04/04/13 77.565 kg (171 lb)    History of present illness:  80 y.o. female  Past medical history paroxysmal atrial fibrillation, hypertension and intermittent chronic diarrhea in the last 24 hour started having problems with cramping lower abdominal pain as well as nausea and vomiting. It got to the point where patient could not keep anything down and on the morning of admission was throwing up water. She felt very weak and could barely stand she came into the emergency room. Lab work was unremarkable. However, an abdominal x-ray noted partial small bowel obstruction. A CT scan was done to further evaluate, however the CT scan was actually normal. Patient's symptoms improved with medication for nausea and pain and IV fluids. Hospitalist were called for further evaluation and admission. Patient was transferred to the floor and by that time was hungry and asking for food. The patient was continued on IV fluids. The patient clinically improved. Her diet was advanced which she tolerated.     Discharge Exam: Filed Vitals:   07/21/13 1328  BP: 110/54  Pulse: 68  Temp: 98.4 F (36.9 C)  Resp: 17   Filed Vitals:   07/20/13 1822 07/20/13 2144  07/21/13 0533 07/21/13 1328  BP: 132/51 110/44 123/45 110/54  Pulse: 67 63 65 68  Temp: 98.6 F (37 C) 99 F (37.2 C) 98.8 F (37.1 C) 98.4 F (36.9 C)  TempSrc: Oral Oral Oral Oral  Resp: 16  20 20 17   Height: 5\' 2"  (1.575 m)     Weight: 79.2 kg (174 lb 9.7 oz)     SpO2: 96% 92% 91% 98%   General: A&O x 3, NAD, pleasant, cooperative Cardiovascular: RRR, no rub, no gallop, no S3 Respiratory: finding a basal crackles. No wheezing. Good air movement. Abdomen:soft, nontender, nondistended, positive bowel sounds Extremities: 1+LE edema, No lymphangitis, no petechiae  Discharge Instructions      Discharge Instructions   Diet - low sodium heart healthy    Complete by:  As directed      Discharge instructions    Complete by:  As directed   Stop taking amlodipine.  Do NOT take amlodipine until you followup with your family doctor and you are instructed to restart it.     Increase activity slowly    Complete by:  As directed             Medication List    STOP taking these medications       amLODipine 5 MG tablet  Commonly known as:  NORVASC      TAKE these medications       albuterol 108 (90 BASE) MCG/ACT inhaler  Commonly known as:  PROVENTIL HFA;VENTOLIN HFA  Inhale 1 puff into the lungs every 6 (six) hours as needed for wheezing or shortness of breath.     aspirin EC 81 MG tablet  Take 81 mg by mouth daily.     calcium carbonate 500 MG chewable tablet  Commonly known as:  TUMS - dosed in mg elemental calcium  Chew 1 tablet by mouth daily as needed for indigestion or heartburn.     colchicine 0.6 MG tablet  Take 1 tablet by mouth every evening.     ferrous sulfate 325 (65 FE) MG tablet  Take 325 mg by mouth every evening.     HYDROcodone-acetaminophen 5-325 MG per tablet  Commonly known as:  NORCO/VICODIN  Take 1-2 tablets by mouth every 4 (four) hours as needed for moderate pain.     montelukast 10 MG tablet  Commonly known as:  SINGULAIR  Take 10 mg by mouth at bedtime.     ondansetron 4 MG tablet  Commonly known as:  ZOFRAN  Take 4 mg by mouth every 8 (eight) hours as needed. For nausea     pantoprazole 20 MG tablet  Commonly known as:   PROTONIX  Take 40 mg by mouth 2 (two) times daily.     pravastatin 20 MG tablet  Commonly known as:  PRAVACHOL  Take 20 mg by mouth every evening.     risperidone 4 MG tablet  Commonly known as:  RISPERDAL  Take 4 mg by mouth daily.     sertraline 50 MG tablet  Commonly known as:  ZOLOFT  Take 1 tablet by mouth every evening.         The results of significant diagnostics from this hospitalization (including imaging, microbiology, ancillary and laboratory) are listed below for reference.    Significant Diagnostic Studies: Ct Abdomen Pelvis W Contrast  07/20/2013   CLINICAL DATA:  Abdominal pain since last night after eating, diarrhea and dizziness for 3 days, vomiting, history unspecified essential hypertension, atrial fibrillation, asthma, DVT,  EXAM: CT ABDOMEN AND PELVIS WITH CONTRAST  TECHNIQUE: Multidetector CT imaging of the abdomen and pelvis was performed using the standard protocol following bolus administration of intravenous contrast. Sagittal and coronal MPR images reconstructed from axial data set.  CONTRAST:  5mL OMNIPAQUE IOHEXOL 300 MG/ML SOLN IV. No oral contrast administered.  COMPARISON:  None.  FINDINGS: Minimal atelectasis at lung bases.  RIGHT renal cysts, largest 2.9 x 2.1 cm image 32.  Post cholecystectomy, hysterectomy, appendectomy.  Splenic enlargement, 13.3 cm length with calculated volume 674 mL.  Liver, spleen, pancreas, kidneys, and adrenal glands otherwise normal appearance.  Diverticulosis of distal descending and sigmoid colon without definite wall thickening or pericolic inflammatory changes to suggest acute diverticulitis.  Tiny amount of nonspecific free pelvic fluid.  Tiny cyst RIGHT ovary.  Stomach and bowel loops otherwise normal appearance for exam lacking GI contrast.  Scattered atherosclerotic calcifications aorta and iliac arteries.  No mass, adenopathy, free fluid, free air, or inflammatory process.  Bones demineralized with old RIGHT pubic rami  fractures noted.  IMPRESSION: Small RIGHT renal cysts.  Splenomegaly.  Distal colonic diverticulosis without evidence of diverticulitis.  No definite acute intra-abdominal or intrapelvic abnormalities.   Electronically Signed   By: Lavonia Dana M.D.   On: 07/20/2013 16:02   Dg Abd Acute W/chest  07/20/2013   CLINICAL DATA:  Abdominal pain. Dizziness. Generalize weakness. Diarrhea. Nausea and vomiting.  EXAM: ACUTE ABDOMEN SERIES (ABDOMEN 2 VIEW & CHEST 1 VIEW)  COMPARISON:  CT abdomen and pelvis 10/02/2010. Two-view chest x-ray 04/02/2013, 06/20/2010.  FINDINGS: Mild gaseous distension of several loops of small bowel in the left mid abdomen and left upper pelvis, demonstrating air-fluid levels on the erect image. No evidence of free intraperitoneal air. Expected stool burden within the colon, with scattered colonic air-fluid levels consistent liquid stool. Surgical clips in the right upper quadrant from prior cholecystectomy. Prominent Riedel's lobe of liver as noted on the prior CT. Splenomegaly, as the spleen tip extends below the costal margin, probably not significantly changed since the prior CT. Phleboliths in both sides of the pelvis. No visible opaque urinary tract calculi. Arterial calcifications involving the splenic artery in the left upper quadrant as noted previously.  Cardiac silhouette upper normal in size, unchanged. Thoracic aorta atherosclerotic, unchanged. Hilar and mediastinal contours otherwise unremarkable. Linear scarring at the left lung base, unchanged. New linear opacities at the right lung base since the February, 2015 examination. Lungs otherwise clear.  IMPRESSION: 1. Partial small bowel obstruction. 2. No free intraperitoneal air. 3. Splenomegaly. 4. Linear atelectasis in the right lung base, new since the most recent previous examination. Stable linear scarring at the left lung base. No acute cardiopulmonary disease otherwise.   Electronically Signed   By: Evangeline Dakin M.D.   On:  07/20/2013 13:48     Microbiology: No results found for this or any previous visit (from the past 240 hour(s)).   Labs: Basic Metabolic Panel:  Recent Labs Lab 07/20/13 1304 07/21/13 0343  NA 139 142  K 4.4 4.1  CL 103 105  CO2 23 24  GLUCOSE 111* 92  BUN 22 17  CREATININE 1.05 1.05  CALCIUM 9.4 9.0   Liver Function Tests:  Recent Labs Lab 07/20/13 1304  AST 15  ALT 10  ALKPHOS 106  BILITOT 0.5  PROT 6.4  ALBUMIN 3.7    Recent Labs Lab 07/20/13 1304  LIPASE 18   No results found for this basename: AMMONIA,  in the last 168 hours CBC:  Recent Labs Lab 07/20/13 1304 07/21/13 0343  WBC 5.8 3.2*  NEUTROABS 4.4  --   HGB 9.9* 8.3*  HCT 31.2* 26.4*  MCV 94.3 95.0  PLT 132* 128*   Cardiac Enzymes:  Recent Labs Lab 07/20/13 1304  TROPONINI <0.30   BNP: No components found with this basename: POCBNP,  CBG: No results found for this basename: GLUCAP,  in the last 168 hours  Time coordinating discharge:  Greater than 30 minutes  Signed:  Hilery Wintle, DO [facility] Pager: [phone] 07/21/2013, 2:28 PM

## 2013-07-21 NOTE — Progress Notes (Signed)
Pt ready for discharge to home accomp by son.  All discharge instructions reviewed with pt and son and copy given.  No new Rx for discharge.  Pt is to follow up with Dr. Chapman Fitch in 1 week.  Pt has #.  Pt is to stop taking Norvasc until seen by Dr.  Chapman Fitch to restart it, pt understands.

## 2013-07-21 NOTE — Progress Notes (Signed)
Nutrition Brief Note  Patient identified on the Malnutrition Screening Tool (MST) Report  Wt Readings from Last 15 Encounters:  07/20/13 174 lb 9.7 oz (79.2 kg)  04/09/13 170 lb (77.111 kg)  04/04/13 171 lb (77.565 kg)  04/04/13 171 lb (77.565 kg)  04/02/13 171 lb (77.565 kg)  03/19/13 177 lb (80.287 kg)  03/05/13 181 lb (82.101 kg)  05/16/12 180 lb (81.647 kg)  07/09/10 183 lb (83.008 kg)  08/19/09 189 lb (85.73 kg)  07/30/09 187 lb (84.823 kg)  08/27/08 181 lb (82.101 kg)    Body mass index is 31.93 kg/(m^2). Patient meets criteria for Obesity based on current BMI.   Current diet order is Heart Healthy, patient is consuming approximately 100% of meals at this time. Pt states her weight fluctuates 1-2 lbs but, denies any weight loss. She states she was eating well PTA and has a good appetite. Pt denies any nutrition-related questions or concerns. Labs and medications reviewed.   No nutrition interventions warranted at this time. If nutrition issues arise, please consult RD.   Pryor Ochoa RD, LDN Inpatient Clinical Dietitian Pager: [phone] After Hours Pager: [phone]

## 2013-07-21 NOTE — Evaluation (Signed)
Physical Therapy Evaluation Patient Details Name: [name] MRN: [id number] DOB: 13-Apr-1927 Today's Date: 07/21/2013   History of Present Illness  Patient admitted for Nausea & vomiting: Quickly resolving small bowel obstruction versus gastroenteritis.    Clinical Impression  Patient presents with some generalized weakness and low endurance, most likely due to recent n/v.  Did recommend to patient that she use straight cane at home for next few days while her endurance and strength returns.  Overall, feel patient is safe with mobility with cane and intermittent supervision.  No further PT needs identified.  Will sign off.    Follow Up Recommendations No PT follow up    Equipment Recommendations  None recommended by PT    Recommendations for Other Services       Precautions / Restrictions Precautions Precautions: None      Mobility  Bed Mobility Overal bed mobility: Modified Independent             General bed mobility comments: used railing to raise off bed  Transfers Overall transfer level: Modified independent Equipment used: None                Ambulation/Gait Ambulation/Gait assistance: Min guard Ambulation Distance (Feet): 75 Feet Assistive device: None Gait Pattern/deviations: Step-through pattern;Decreased stride length;Trunk flexed        Stairs            Wheelchair Mobility    Modified Rankin (Stroke Patients Only)       Balance Overall balance assessment: No apparent balance deficits (not formally assessed) (reports no falls in last 3 months)                                           Pertinent Vitals/Pain Denies pain    Home Living Family/patient expects to be discharged to:: Private residence Living Arrangements: Children (she cares for son) Available Help at Discharge: Family;Friend(s);Available PRN/intermittently Type of Home: House Home Access: Stairs to enter Entrance Stairs-Rails:  None Entrance Stairs-Number of Steps: 2 Home Layout: One level Home Equipment: Walker - 2 wheels;Cane - single point      Prior Function Level of Independence: Independent               Hand Dominance        Extremity/Trunk Assessment   Upper Extremity Assessment: Generalized weakness           Lower Extremity Assessment: Generalized weakness         Communication   Communication: No difficulties  Cognition Arousal/Alertness: Awake/alert Behavior During Therapy: WFL for tasks assessed/performed Overall Cognitive Status: Within Functional Limits for tasks assessed                      General Comments      Exercises        Assessment/Plan    PT Assessment Patent does not need any further PT services  PT Diagnosis     PT Problem List    PT Treatment Interventions     PT Goals (Current goals can be found in the Care Plan section) Acute Rehab PT Goals PT Goal Formulation: No goals set, d/c therapy    Frequency     Barriers to discharge        Co-evaluation               End of Session  Equipment Utilized During Treatment: Gait belt Activity Tolerance: Patient tolerated treatment well Patient left: in chair;with family/visitor present      Functional Assessment Tool Used: clinical judgement Functional Limitation: Mobility: Walking and moving around Mobility: Walking and Moving Around Current Status [phone]): At least 1 percent but less than 20 percent impaired, limited or restricted Mobility: Walking and Moving Around Goal Status [phone]): At least 1 percent but less than 20 percent impaired, limited or restricted Mobility: Walking and Moving Around Discharge Status [phone]): At least 1 percent but less than 20 percent impaired, limited or restricted    Time: 1230-1245 PT Time Calculation (min): 15 min   Charges:   PT Evaluation $Initial PT Evaluation Tier I: 1 Procedure     PT G Codes:   Functional Assessment Tool Used:  clinical judgement Functional Limitation: Mobility: Walking and moving around    Shanna Cisco, [place] 07/21/2013, 12:50 PM

## 2013-07-24 DIAGNOSIS — D649 Anemia, unspecified: Secondary | ICD-10-CM | POA: Diagnosis not present

## 2013-07-24 DIAGNOSIS — J019 Acute sinusitis, unspecified: Secondary | ICD-10-CM | POA: Diagnosis not present

## 2013-07-24 DIAGNOSIS — R197 Diarrhea, unspecified: Secondary | ICD-10-CM | POA: Diagnosis not present

## 2013-07-24 DIAGNOSIS — E860 Dehydration: Secondary | ICD-10-CM | POA: Diagnosis not present

## 2013-07-24 DIAGNOSIS — R42 Dizziness and giddiness: Secondary | ICD-10-CM | POA: Diagnosis not present

## 2013-07-24 DIAGNOSIS — R1084 Generalized abdominal pain: Secondary | ICD-10-CM | POA: Diagnosis not present

## 2013-07-30 DIAGNOSIS — R197 Diarrhea, unspecified: Secondary | ICD-10-CM | POA: Diagnosis not present

## 2013-07-31 ENCOUNTER — Telehealth: Payer: Self-pay | Admitting: Cardiology

## 2013-07-31 DIAGNOSIS — M7989 Other specified soft tissue disorders: Secondary | ICD-10-CM | POA: Diagnosis not present

## 2013-07-31 DIAGNOSIS — R5383 Other fatigue: Secondary | ICD-10-CM | POA: Diagnosis not present

## 2013-07-31 DIAGNOSIS — R5381 Other malaise: Secondary | ICD-10-CM | POA: Diagnosis not present

## 2013-07-31 DIAGNOSIS — R42 Dizziness and giddiness: Secondary | ICD-10-CM | POA: Diagnosis not present

## 2013-07-31 NOTE — Telephone Encounter (Signed)
Contacted pt.  Pt was calling because she states she is having some edema in her lower extremities, but mainly her left knee, that she recently had total knee replacement on Pt denies any sob or cp.  Pt states that her Daughter told her the edema was coming from her heart.  Pt states she went to see Dr Abbe Amsterdam today at a walk-in clinic on [facility], and he drew labs.  Pt not quite sure what her diagnosis was from today's visit.  Pt states that Dr Luna Glasgow office stated they would call pt with her lab results.  Advised pt that when they call to give her results, tell them to fax those to our office at [phone].  Pt wrote this back with verbal feedback.  Also advised pt to weigh herself everyday at the same time for one week and log this information.  Advised pt to take her BP at the same time everyday for one week and log this as well.  Instructed pt to call our office next week to endorse BP and weight readings, and notify us of her lab results as well.  Also advised pt to follow a low sodium diet and stay plenty hydrated as she just verbalized that she was on a abt for recent UTI.  Informed pt that if she becomes sob, has cp, dizziness, and feeling faint, then she needs to call 911 and inform our office.  Pt verbalized understanding and agrees with this plan.  Will forward this message to Dr Meda Coffee for further review and recommendation.  Pt verbalized understanding and agrees with this plan. Pt very grateful for all the assistance provided.

## 2013-07-31 NOTE — Telephone Encounter (Signed)
LMTCB

## 2013-07-31 NOTE — Telephone Encounter (Signed)
New message          Pt legs are swollen and believes this has something to do with her heart. Please give pt a call.

## 2013-08-02 ENCOUNTER — Encounter: Payer: Self-pay | Admitting: Cardiology

## 2013-08-02 DIAGNOSIS — R197 Diarrhea, unspecified: Secondary | ICD-10-CM | POA: Diagnosis not present

## 2013-08-03 ENCOUNTER — Telehealth: Payer: Self-pay | Admitting: Cardiology

## 2013-08-03 NOTE — Telephone Encounter (Signed)
New Prob   Pt is calling in regards to her knee. Reports pain and swelling to R knee x 2 days. Pt is also calling to reports her weight. Please call.

## 2013-08-03 NOTE — Telephone Encounter (Signed)
New problem:      Pt's daughter called to say pt can not move her right knee it is swollen (both legs off and on )for there pass week. Pt just had knee surgery. Pt would like to be seen, she has already seen her PCP and they recommended she sees her Card.  Please give daughter a call.

## 2013-08-03 NOTE — Telephone Encounter (Signed)
Voicemail.  Mailbox full and not accepting messages.

## 2013-08-03 NOTE — Telephone Encounter (Signed)
New problem    Pt's daughter called to say pt can not move her right knee it is swollen in both legs for there pass week.   Pt just had knee surgery.   Pt would like to be seen, she has already seen her PCP and they recommended she sees her Card.

## 2013-08-03 NOTE — Telephone Encounter (Signed)
Pt's daughter would like to make an appointment for pt with Dr. Meda Coffee. Daughter states pt had a left knee surgery last week. Now both LE are swollen, and she is not able to bend right knee. Pt C/O of weakness and dizziness. Pt was seen by PCP which recommends to call her cardiologist. Pt will see her surgeon tomorrow "Saturday". An appointment was offered for July 8 th with Ermalinda Barrios PA, but the entire family will be out of town that week. An appointment was made for August 11 th with Dr. Meda Coffee at 3:00 Pm. The would like to have an earlier appointment if possible.

## 2013-08-04 DIAGNOSIS — Z96659 Presence of unspecified artificial knee joint: Secondary | ICD-10-CM | POA: Diagnosis not present

## 2013-08-06 ENCOUNTER — Other Ambulatory Visit: Payer: Self-pay | Admitting: *Deleted

## 2013-08-06 NOTE — Telephone Encounter (Signed)
[name], Could you fit her on the schedule this Th or Fri? Thank you, K

## 2013-08-08 ENCOUNTER — Ambulatory Visit (INDEPENDENT_AMBULATORY_CARE_PROVIDER_SITE_OTHER): Payer: Medicare Other | Admitting: Cardiology

## 2013-08-08 ENCOUNTER — Encounter: Payer: Self-pay | Admitting: Cardiology

## 2013-08-08 VITALS — BP 116/60 | HR 78 | Ht 62.0 in | Wt 172.0 lb

## 2013-08-08 DIAGNOSIS — I509 Heart failure, unspecified: Secondary | ICD-10-CM | POA: Diagnosis not present

## 2013-08-08 MED ORDER — FUROSEMIDE 40 MG PO TABS: 40.0000 mg | ORAL_TABLET | Freq: Every day | ORAL | Status: DC

## 2013-08-08 NOTE — Patient Instructions (Signed)
Your physician has recommended you make the following change in your medication:   START TAKING LASIX (FUROSEMIDE) [name] physician has requested that you have an echocardiogram. Echocardiography is a painless test that uses sound waves to create images of your heart. It provides your doctor with information about the size and shape of your heart and how well your heart's chambers and valves are working. This procedure takes approximately one hour. There are no restrictions for this procedure.  YOUR MD WOULD LIKE FOR YOU TO HAVE THIS DONE IN 2 WEEKS  Your physician recommends that you return for lab work in: IN 2 WEEKS (BMET) [place] [facility]  Your physician recommends that you schedule a follow-up appointment in: IN ONE MONTH WITH ONE OF OUR EXTENDERS

## 2013-08-08 NOTE — Progress Notes (Signed)
Patient ID: [name], female   DOB: January 30, 1928, 80 y.o.   MRN: [id number]    Patient Name: [name] Date of Encounter: 08/08/2013  Primary Care Provider:  Antony Blackbird, MD Primary Cardiologist:  Dorothy Spark (formerlly Dr Verl Blalock)  Problem List   Past Medical History  Diagnosis Date  . Atrial fibrillation   . Palpitations   . Unspecified essential hypertension   . Personal history of other diseases of digestive system   . Other abnormal glucose   . Odynophagia   . Anginal pain     pressure and flutters  . Shortness of breath     with activity  . Pneumonia     hx of  . Arthritis   . Gout   . Diverticulitis   . DVT (deep venous thrombosis)     from birth control, left groin  . Cataract   . Hypercholesteremia     controlled  . GERD (gastroesophageal reflux disease)   . Depression   . Asthma     no problems recent  . Family history of anesthesia complication 30 y.a.    nausea and vomiting (son)  . Headache(784.0)     migraines "as a girl" (not in > 40 yrs)  . Cancer 2014    skin R elbow & face  . Anemia     "I stay anemic"  . Fibromyalgia 10 y.a.  . Blood dyscrasia age 63    blood clot in groin, PE, "was taking hormones"   Past Surgical History  Procedure Laterality Date  . Laryngoscopy / bronchoscopy / esophagoscopy  2010    Dr. Benjamine Mola  . Laminectomy  05/2007    foraminotomy, L4-5 laminectomy  . Cholecystectomy  10/2005    Dr. Effie Shy  . Colonoscopy w/ biopsies  2005, 2011    Dr. Bing Plume, "balloon inside for last one at [facility]"  . Skin cancer excision Right ~2005    r elbow  . Tubal ligation    . Appendectomy  ~55years ago  . Abdominal hysterectomy      partial  . Tumor removal Right ~ 20 years ago    tumor in between toes  . Knee arthroscopy Right   . Total knee arthroplasty Left 04/04/2013    Procedure: LEFT TOTAL KNEE ARTHROPLASTY;  Surgeon: Tobi Bastos, MD;  Location: [facility];  Service: Orthopedics;  Laterality: Left;     Allergies  Allergies  Allergen Reactions  . Amoxicillin Other (See Comments)    Very weak  . Diltiazem Hcl Other (See Comments)    Reaction unknown  . Sulfonamide Derivatives Other (See Comments)    Reaction unknown  . Zetia [Ezetimibe] Other (See Comments)    Weakness   HPI  [name] is a former patient of Dr Verl Blalock. She was last seen a year ago and was doing remarkably well. She has h/o paroxysmal atrial fibrillation and mild pulmonary hypertension.  She is coming today for a 1 year follow up and preoperative evaluation prior to the knee replacement surgery.  She is doing relatively well. She states that she hasn't walk as much as she wishes because she is limited by pain in her knees. She denies chest pain. She feels just slightly more short of breath, that she attributes to deconditioning but can certainly walk 2 blocks. Occasional palpitations not lasting more than few seconds. She denies syncope, no orthopnea or PND. She has developed mild lower extremity edema. She is very compliant with her  medications and her BP has been controlled.   She has a history of significant bradycardia with beta blockers and diltiazem particularly.   She is coming after two weeks, she states that she was not taking furosemide as she developed LE cramping after it. Her LE edema is only at night. OTherwise she has no complains.   Labs were checked, all normal, Crea improved, currently 1.5, previously 1.77.  The patient has no symptoms of angina, and only minimal shortness of breath on exertion. Echocardiogram from 03/12/2013 showed improved right ventricular size and function that is now normal and also improved RVSP that is also in the normal range.  08/08/2013 - the patient underwent knee replacement in February. She was recently hospitalized with acute gastroenteritis and dehydration her amlodipine was stopped and she received IV fluids. She afterwards developed lower extremity edema up to her knees  and groin pain. She called her office and was started on Lasix 40 to decrease her on swelling but there is still residual swelling. She feels some mild shortness of breath.  Home Medications  Prior to Admission medications   Medication Sig Start Date End Date Taking? Authorizing Provider  amLODipine (NORVASC) 5 MG tablet Take 1 tablet (5 mg total) by mouth daily. 05/16/12  Yes Renella Cunas, MD  aspirin 81 MG tablet Take 81 mg by mouth daily.     Yes Historical Provider, MD  Cholecalciferol (VITAMIN D3) 1000 UNITS CAPS Take 2,000 Units by mouth.   Yes Historical Provider, MD  Coenzyme Q10 (CO Q 10 PO) Take by mouth daily.   Yes Historical Provider, MD  COLCRYS 0.6 MG tablet TAKE ONE TABLET DAILY 02/20/13  Yes Historical Provider, MD  ferrous sulfate 325 (65 FE) MG tablet Take 325 mg by mouth daily with breakfast.     Yes Historical Provider, MD  fish oil-omega-3 fatty acids 1000 MG capsule Take 2 g by mouth daily.     Yes Historical Provider, MD  FLOVENT HFA 110 MCG/ACT inhaler as needed. 02/23/12  Yes Historical Provider, MD  montelukast (SINGULAIR) 10 MG tablet Take 10 mg by mouth at bedtime.     Yes Historical Provider, MD  Multiple Vitamin (MULTIVITAMIN PO) Take by mouth daily.     Yes Historical Provider, MD  oxyCODONE-acetaminophen (PERCOCET) 10-325 MG per tablet TAKE 1/2 TABLET DAILY AS NEEDED 02/26/13  Yes Historical Provider, MD  PANTOPRAZOLE SODIUM PO Take by mouth 2 (two) times daily.     Yes Historical Provider, MD  pravastatin (PRAVACHOL) 20 MG tablet Take 20 mg by mouth daily.     Yes Historical Provider, MD  sertraline (ZOLOFT) 50 MG tablet Take 1 tablet by mouth daily. 03/15/12  Yes Historical Provider, MD  ULORIC 40 MG tablet Take 1 tablet by mouth daily. 03/15/12  Yes Historical Provider, MD  VENTOLIN HFA 108 (90 BASE) MCG/ACT inhaler as needed. 03/06/12  Yes Historical Provider, MD   Family History  Family History  Problem Relation Age of Onset  . Heart disease Mother   .  Diabetes Father   . Stroke Father   . Cancer Sister    Social History  History   Social History  . Marital Status: Widowed    Spouse Name: N/A    Number of Children: N/A  . Years of Education: N/A   Occupational History  . Not on file.   Social History Main Topics  . Smoking status: Former Smoker -- 10 years    Types: Cigarettes    Quit  date: 02/08/1973  . Smokeless tobacco: Never Used  . Alcohol Use: No  . Drug Use: No  . Sexual Activity: No   Other Topics Concern  . Not on file   Social History Narrative  . No narrative on file    Review of Systems, as per HPI, otherwise negative General:  No chills, fever, night sweats or weight changes.  Cardiovascular:  No chest pain, dyspnea on exertion, edema, orthopnea, palpitations, paroxysmal nocturnal dyspnea. Dermatological: No rash, lesions/masses Respiratory: No cough, dyspnea Urologic: No hematuria, dysuria Abdominal:   No nausea, vomiting, diarrhea, bright red blood per rectum, melena, or hematemesis Neurologic:  No visual changes, wkns, changes in mental status. All other systems reviewed and are otherwise negative except as noted above.  Physical Exam  Blood pressure 116/60, pulse 78, height 5\' 2"  (1.575 m), weight 172 lb (78.019 kg), SpO2 95.00%.  General: Pleasant, NAD Psych: Normal affect. Neuro: Alert and oriented X 3. Moves all extremities spontaneously. HEENT: Normal  Neck: Supple without bruits or JVD. Lungs:  Resp regular and unlabored, minimal basal crackles Heart: RRR no s3, s4, or murmurs. Abdomen: Soft, non-tender, non-distended, BS + x 4.  Extremities: No clubbing, cyanosis, minimal non-pitting B/L ankle edema. DP/PT/Radials 2+ and equal bilaterally.  Labs:  No results found for this basename: CKTOTAL, CKMB, TROPONINI,  in the last 72 hours Lab Results  Component Value Date   WBC 3.2* 07/21/2013   HGB 8.3* 07/21/2013   HCT 26.4* 07/21/2013   MCV 95.0 07/21/2013   PLT 128* 07/21/2013   No  results found for this basename: NA, K, CL, CO2, BUN, CREATININE, CALCIUM, LABALBU, PROT, BILITOT, ALKPHOS, ALT, AST, GLUCOSE,  in the last 168 hours Lab Results  Component Value Date   CHOL  Value: 118        ATP III CLASSIFICATION:  <200     mg/dL   Desirable  200-239  mg/dL   Borderline High  >=240    mg/dL   High        06/07/2008   HDL 35* 06/07/2008   LDLCALC  Value: 66        Total Cholesterol/HDL:CHD Risk Coronary Heart Disease Risk Table                     Men   Women  1/2 Average Risk   3.4   3.3  Average Risk       5.0   4.4  2 X Average Risk   9.6   7.1  3 X Average Risk  23.4   11.0        Use the calculated Patient Ratio above and the CHD Risk Table to determine the patient's CHD Risk.        ATP III CLASSIFICATION (LDL):  <100     mg/dL   Optimal  100-129  mg/dL   Near or Above                    Optimal  130-159  mg/dL   Borderline  160-189  mg/dL   High  >190     mg/dL   Very High 06/07/2008   TRIG 85 06/07/2008   Accessory Clinical Findings  Echocardiogram - 06/07/2008  1. Left ventricle: The cavity size was normal. Wall thickness was increased in a pattern of moderate LVH. The estimated ejection fraction was 65%. Wall motion was normal; there were no regional wall motion abnormalities. 2. Left atrium: The atrium was  mildly to moderately dilated. 3. Right ventricle: The cavity size was mildly to moderately dilated. Systolic function was low normal. The estimated peak pressure was 50mm Hg. 4. Pulmonary arteries: PA peak pressure: 45mm Hg (S).   Echocardiogram 03/12/2013  - Left ventricle: The cavity size was normal. There was moderate concentric hypertrophy. Systolic function was normal. The estimated ejection fraction was in the range of 55% to 60%. Wall motion was normal; there were no regional wall motion abnormalities. - Mitral valve: Calcified annulus. - Left atrium: The atrium was mildly dilated. - Right ventricle: normal size and function - Pulmonary arteries: PA  peak pressure: 63mm Hg (S). Impressions:  - Compared to 2010, PA pressure is lower, RV size is normal and RV function has improved.  ECG - SR, left axis deviation, LAFB, poor R wave progression in the anterior leads, unchanged from prior in 2014   Assessment & Plan  A very pleasant 80 year old female with h/o hypertension, paroxysmal atrial fibrillation and history of mild pulmonary hypertension who developed signs of congestive heart failure after acute gastroenteritis and IV rehydration in the hospital.   1. CHF - acute on chronic diastolic, with improvement of swelling with 40 mg of Lasix daily x5 days we will continue and recheck BMP in 2 weeks.  The patient also has a history of pulmonary hypertension did improve to a amlodipine however currently low blood pressure and we are continuing Lasix, we'll reassess in 3 weeks and start amlodipine 2.5 mg if blood pressure allows. We will order echocardiogram to evaluate biventricular function and right and left-sided pressures.  Follow up in 1 month.  Dorothy Spark, MD, [facility] 08/08/2013, 2:38 PM

## 2013-08-09 DIAGNOSIS — Z96659 Presence of unspecified artificial knee joint: Secondary | ICD-10-CM | POA: Diagnosis not present

## 2013-08-14 ENCOUNTER — Telehealth: Payer: Self-pay | Admitting: Cardiology

## 2013-08-14 DIAGNOSIS — I480 Paroxysmal atrial fibrillation: Secondary | ICD-10-CM

## 2013-08-14 DIAGNOSIS — R42 Dizziness and giddiness: Secondary | ICD-10-CM

## 2013-08-14 DIAGNOSIS — I272 Pulmonary hypertension, unspecified: Secondary | ICD-10-CM

## 2013-08-14 DIAGNOSIS — R0600 Dyspnea, unspecified: Secondary | ICD-10-CM

## 2013-08-14 DIAGNOSIS — I10 Essential (primary) hypertension: Secondary | ICD-10-CM

## 2013-08-14 NOTE — Telephone Encounter (Signed)
New message     Doctor increased her lasix.  [place] the beach and she is dizzy, weak and can't hardly walk.  Pt is allergic to sulfur.  Pt did not take a lasix today.  Please advise

## 2013-08-14 NOTE — Telephone Encounter (Signed)
Pts Daughter calling to inform Dr Meda Coffee and nurse that since her mom has been prescribed 40 mg of Lasix, she has become very weak, dizzy, and unable to perform her ADL's as she usually does.  Daughter states pt denies any c/o cp, sob, doe.  Daughter states that pt has noted BLE but no more than usual.  Daughter states mother's urine output is fair, but has noted cloudy urine.  Daughter states mothers current BP is 130/60 and HR is WNL and regular.  Daughter states she has been withholding her mothers lasix, because she states she cannot tolerate this med.  Daughter states she wanted Dr Meda Coffee to further advise if there is something else she could take in place of her lasix.  Daughter states she is a NP.  Asked Daughter what pts weight is?  Unable to obtain this measurement.  Daughter states her breathing has not altered in any way.  She states "mom is not in any overload at this time." Daughter states her Mothers mentation comes and goes, but is in no obvious distress. Daughter states she is withholding her lasix until further advised. Daughter states [place] the beach, [place] away with mother.  Informed Daughter that Dr Meda Coffee is out of the office today.  Advised Daughter given pts history and being so far from the office, she should consider taking her Mother to a hospital or urgent care for further eval of dehydration or a UTI.  Advised Daughter that if mother starts having any complaints of cp, sob,  Increased LEE, cough, dizziness, syncopal episodes, then she needs to call 911.  Daughter states she will be back in town next week and it would be great if an extender in our office could see her Mother.  Told Daughter that I will send a message to our schedulers to check for availability and schedule accordingly, and someone will call her back with that appt.  Informed Daughter that I will route this message to Dr Meda Coffee for further review and recommendation.  Daughter verbalized  understanding and agrees with this plan.

## 2013-08-14 NOTE — Telephone Encounter (Signed)
LMTCB

## 2013-08-16 NOTE — Telephone Encounter (Signed)
Could you please call them and tell them to hold Lasix and repeat BMP and BNP in 2 weeks? Could she see me once I am back from vacation? Wynn Banker is ok Thank you!

## 2013-08-16 NOTE — Telephone Encounter (Signed)
That's ok with me.

## 2013-08-16 NOTE — Telephone Encounter (Signed)
Pt was contacted about Dr Francesca Oman recommendation for pt to hold her Lasix and have labs repeated in 2 weeks (bmp & bnp), and schedule a follow-up visit with her at her next available appt.  Asked pt if her Daughter [name] was around to endorse these orders to and get appts scheduled, as she is the pts way to get to appts, and pt stated she is out at the moment but will be back shortly.  Instructed pt to have her call the office on return, to endorse orders.  Pt verbalized understanding and agrees with this plan.

## 2013-08-16 NOTE — Addendum Note (Signed)
Addended by: Nuala Alpha on: 08/16/2013 03:45 PM   Modules accepted: Orders

## 2013-08-16 NOTE — Telephone Encounter (Signed)
Pts Daughter [name] was contacted about Dr Thera Flake recommendation for pt to hold her lasix and have her labs (bmp & bnp) repeated in 2 weeks and schedule a f/u appt thereafter with Dr Meda Coffee.  Daughter informed that she will be bringing her mom in any ways next Friday 7/17 for already scheduled labs and echo.  Daughter also stated that pt has a scheduled appt with Richardson Dopp PA on 7/29 and another f/u appt with Dr Meda Coffee on 8/11.  Daughter asked if it would be ok to come in next Friday for the additional lab, and follow-up with the already scheduled appts.  Informed daughter that I will notify Dr Meda Coffee, but I think she would be ok with this.  Daughter verbalized understanding of orders endorsed and agrees with the plan.

## 2013-08-20 ENCOUNTER — Ambulatory Visit: Payer: Medicare Other | Admitting: Physician Assistant

## 2013-08-24 ENCOUNTER — Ambulatory Visit (HOSPITAL_COMMUNITY): Payer: Medicare Other | Attending: Internal Medicine | Admitting: Radiology

## 2013-08-24 ENCOUNTER — Other Ambulatory Visit (INDEPENDENT_AMBULATORY_CARE_PROVIDER_SITE_OTHER): Payer: Medicare Other

## 2013-08-24 DIAGNOSIS — R0609 Other forms of dyspnea: Secondary | ICD-10-CM | POA: Diagnosis not present

## 2013-08-24 DIAGNOSIS — R42 Dizziness and giddiness: Secondary | ICD-10-CM

## 2013-08-24 DIAGNOSIS — I2789 Other specified pulmonary heart diseases: Secondary | ICD-10-CM

## 2013-08-24 DIAGNOSIS — R0989 Other specified symptoms and signs involving the circulatory and respiratory systems: Secondary | ICD-10-CM

## 2013-08-24 DIAGNOSIS — I509 Heart failure, unspecified: Secondary | ICD-10-CM | POA: Diagnosis not present

## 2013-08-24 DIAGNOSIS — I480 Paroxysmal atrial fibrillation: Secondary | ICD-10-CM

## 2013-08-24 DIAGNOSIS — I4891 Unspecified atrial fibrillation: Secondary | ICD-10-CM

## 2013-08-24 DIAGNOSIS — I10 Essential (primary) hypertension: Secondary | ICD-10-CM

## 2013-08-24 DIAGNOSIS — I272 Pulmonary hypertension, unspecified: Secondary | ICD-10-CM

## 2013-08-24 DIAGNOSIS — R0600 Dyspnea, unspecified: Secondary | ICD-10-CM

## 2013-08-24 LAB — BASIC METABOLIC PANEL
BUN: 20 mg/dL (ref 6–23)
CO2: 29 mEq/L (ref 19–32)
Calcium: 9.3 mg/dL (ref 8.4–10.5)
Chloride: 103 mEq/L (ref 96–112)
Creatinine, Ser: 1.2 mg/dL (ref 0.4–1.2)
GFR: 46.82 mL/min — ABNORMAL LOW (ref >60.00)
Glucose, Bld: 96 mg/dL (ref 70–99)
Potassium: 4.7 mEq/L (ref 3.5–5.1)
Sodium: 138 mEq/L (ref 135–145)

## 2013-08-24 LAB — BRAIN NATRIURETIC PEPTIDE: Pro B Natriuretic peptide (BNP): 113 pg/mL — ABNORMAL HIGH (ref 0.0–100.0)

## 2013-08-24 NOTE — Progress Notes (Signed)
Quick Note:  Preliminary report reviewed by triage nurse and sent to MD desk. ______ 

## 2013-08-24 NOTE — Progress Notes (Signed)
Echocardiogram performed.  

## 2013-08-27 NOTE — Telephone Encounter (Signed)
LMTCB

## 2013-08-27 NOTE — Telephone Encounter (Signed)
Left pt a message to call back. 

## 2013-08-27 NOTE — Telephone Encounter (Signed)
New Message  Pt called states that when she lays down she is "Short winded" when she takes her lasix's she is very dizzy. Pt reports that she is currently light headed. Requests a call back to discuss. Please call.

## 2013-08-28 ENCOUNTER — Telehealth: Payer: Self-pay | Admitting: Cardiology

## 2013-08-28 NOTE — Telephone Encounter (Signed)
Pt calling to inform Dr Meda Coffee and nurse that she is very swimmy headed and just very low in energy.  Pt states she quit taking her Lasix last Friday.  Pt was instructed to d/c this on telephone encounter on 7/9 and come in for lab work (bmp and bnp) on 7/17.  Pt came in for labs, and they were showed to DOD Dr Tamala Julian on 7/20, d/t Dr Meda Coffee being out of the office.  Per Dr Tamala Julian the pts labs were stable and actually improved. Per Dr Tamala Julian, continue on current regimen and scheduled f/u OV.  Pt denies any cp.  Pt states she gets "short winded" when she exerts, but this has been occuring for weeks.  Pt states she's coughing and feels like her sinuses are bothering her.  Pt states her head feels very "full." Pt is scheduled to see Richardson Dopp PA-C on 7/29 and OV with Dr Meda Coffee on 8/11 for reoccurring issues with dizziness and having doe.  Advised pt given her complaints today, she should schedule a f/u appt with her PCP to rule out other issues besides cardiac, and continue with scheduled OV's at our office for next week and thereafter.  Advised pt that if she starts having sob, palpitations, cp, LEE, decreased urine output, syncopal episodes, or becomes acutely distressed at all, then she should call 911 and notify our office.  Advised pt to continue taking in a healthy amount of fluids to avoid dehydration, and keep her sodium intake to a healthy minimum as well.  Advised pt to continue eating properly.  Pt states "my food intake has never been a problem." Asked pt to give me a call back to inform me of when her appt with her PCP will be this week.  Informed pt that if her PCP needs [place] to fax over any information or recent labs, we will be glad to do this.  Pt verbalized understanding and agrees with this plan.  Pt very grateful for all the assistance provided.

## 2013-08-28 NOTE — Telephone Encounter (Signed)
F/u ° ° °Pt returning your call °

## 2013-08-28 NOTE — Telephone Encounter (Signed)
New message     Pt has an appt with Dr Chapman Fitch at [place] at [facility] on Thursday, July 23 at 11:30.  Dr Meda Coffee wanted to know when she is going to see her PCP

## 2013-08-28 NOTE — Telephone Encounter (Signed)
Contacted pt to inform her that I received her appt date and time with Dr Chapman Fitch for 7/23 at 1130.  Informed pt that if Dr Chapman Fitch needs [place] to fax lab work to his office, to notify us and we will take care of this.  Pt verbalized understanding and very grateful for all the assistance provided.

## 2013-08-30 DIAGNOSIS — R42 Dizziness and giddiness: Secondary | ICD-10-CM | POA: Diagnosis not present

## 2013-08-30 DIAGNOSIS — D649 Anemia, unspecified: Secondary | ICD-10-CM | POA: Diagnosis not present

## 2013-09-03 ENCOUNTER — Telehealth: Payer: Self-pay | Admitting: Hematology and Oncology

## 2013-09-03 NOTE — Telephone Encounter (Signed)
S/W PATIENT AND GAVE NP APPT FOR 08/04 @ 12:30 W/DR. Daguao.  REFERRING DR. Ander Gaster FULP DX- ANEMIA

## 2013-09-05 ENCOUNTER — Ambulatory Visit
Admission: RE | Admit: 2013-09-05 | Discharge: 2013-09-05 | Disposition: A | Discharge status: Home / Self Care | Payer: Medicare Other | Source: Ambulatory Visit | Attending: Physician Assistant | Admitting: Physician Assistant

## 2013-09-05 ENCOUNTER — Encounter: Payer: Self-pay | Admitting: Physician Assistant

## 2013-09-05 ENCOUNTER — Ambulatory Visit (INDEPENDENT_AMBULATORY_CARE_PROVIDER_SITE_OTHER): Payer: Medicare Other | Admitting: Physician Assistant

## 2013-09-05 VITALS — BP 134/77 | HR 75 | Ht 60.0 in | Wt 171.0 lb

## 2013-09-05 DIAGNOSIS — I4891 Unspecified atrial fibrillation: Secondary | ICD-10-CM

## 2013-09-05 DIAGNOSIS — R42 Dizziness and giddiness: Secondary | ICD-10-CM

## 2013-09-05 DIAGNOSIS — I5032 Chronic diastolic (congestive) heart failure: Secondary | ICD-10-CM | POA: Diagnosis not present

## 2013-09-05 DIAGNOSIS — I10 Essential (primary) hypertension: Secondary | ICD-10-CM | POA: Diagnosis not present

## 2013-09-05 DIAGNOSIS — I272 Pulmonary hypertension, unspecified: Secondary | ICD-10-CM

## 2013-09-05 DIAGNOSIS — R0602 Shortness of breath: Secondary | ICD-10-CM

## 2013-09-05 DIAGNOSIS — I2789 Other specified pulmonary heart diseases: Secondary | ICD-10-CM | POA: Diagnosis not present

## 2013-09-05 DIAGNOSIS — J984 Other disorders of lung: Secondary | ICD-10-CM | POA: Diagnosis not present

## 2013-09-05 DIAGNOSIS — I480 Paroxysmal atrial fibrillation: Secondary | ICD-10-CM

## 2013-09-05 MED ORDER — MECLIZINE HCL 25 MG PO TABS: ORAL_TABLET | ORAL | Status: DC

## 2013-09-05 NOTE — Progress Notes (Signed)
Cardiology Office Note    Date:  09/05/2013   ID:  [name], DOB 10/28/1927, MRN [id number]  PCP:  Antony Blackbird, MD  Cardiologist:  Dr. Jenell Milliner >>> Dr. Ena Dawley      History of Present Illness: [name] is a 80 y.o. female with a history of paroxysmal atrial fibrillation, mild pulmonary hypertension, HTN, HL. She has a history of bradycardia with beta blockers and diltiazem.  She established with Dr. Meda Coffee in 02/2013.  She had knee surgery in February. Admitted in 07/2013 with gastroenteritis and dehydration.  With hydration, she developed volume excess and was placed on Lasix. Last seen by Dr. Meda Coffee 08/08/13 and edema was improved. Diuretic regimen was continued. Follow up echocardiogram was arranged. This demonstrated normal LVF, mild diastolic dysfunction.  She was brought back today for follow up in hopes that she would resume amlodipine (held for low BP during gastroenteritis) if her blood pressure will allow.  Patient stopped furosemide secondary to weakness and dizziness. Weakness is better. Dizziness is not improved. She notes spinning sensation with changes in head position. She notes dyspnea with exertion. She describes NYHA 2b-3 symptoms. She denies chest pain. She does describe occasional throat discomfort. She is not certain if this occurs with exertion. She has slept on an incline for years. She denies PND. LE edema is improved. She denies syncope.   Studies:  - Echo (08/24/13):  Mild LVH, EF 0000000, grade 1 diastolic dysfunction, mild MR, severe LAE  - Nuclear (02/2006):  EF 75%, no ischemia, normal study   Recent Labs: 03/05/2013: TSH 0.48  07/20/2013: ALT 10  07/21/2013: Hemoglobin 8.3*  08/24/2013: Creatinine 1.2; Potassium 4.7; Pro B Natriuretic peptide (BNP) 113.0*   Wt Readings from Last 3 Encounters:  09/05/13 171 lb (77.565 kg)  08/08/13 172 lb (78.019 kg)  07/20/13 174 lb 9.7 oz (79.2 kg)     Past Medical History  Diagnosis Date  .  Atrial fibrillation   . Palpitations   . Unspecified essential hypertension   . Personal history of other diseases of digestive system   . Other abnormal glucose   . Odynophagia   . Anginal pain     pressure and flutters  . Shortness of breath     with activity  . Pneumonia     hx of  . Arthritis   . Gout   . Diverticulitis   . DVT (deep venous thrombosis)     from birth control, left groin  . Cataract   . Hypercholesteremia     controlled  . GERD (gastroesophageal reflux disease)   . Depression   . Asthma     no problems recent  . Family history of anesthesia complication 30 y.a.    nausea and vomiting (son)  . Headache(784.0)     migraines "as a girl" (not in > 40 yrs)  . Cancer 2014    skin R elbow & face  . Anemia     "I stay anemic"  . Fibromyalgia 10 y.a.  . Blood dyscrasia age 53    blood clot in groin, PE, "was taking hormones"    Current Outpatient Prescriptions  Medication Sig Dispense Refill  . albuterol (PROVENTIL HFA;VENTOLIN HFA) 108 (90 BASE) MCG/ACT inhaler Inhale 1 puff into the lungs every 6 (six) hours as needed for wheezing or shortness of breath.      Marland Kitchen alendronate (FOSAMAX) 10 MG tablet Take 10 mg by mouth daily before breakfast. Take with a full  glass of water on an empty stomach.      Marland Kitchen aspirin EC 81 MG tablet Take 81 mg by mouth daily.      . Cholecalciferol (VITAMIN D3) 2000 UNITS TABS Take 2,000 tablets by mouth daily.      . Coenzyme Q10 (CO Q 10 PO) Take 200 mg by mouth daily.      . colchicine 0.6 MG tablet Take 1 tablet by mouth every evening.      . ferrous sulfate 325 (65 FE) MG tablet Take 325 mg by mouth every evening.       Marland Kitchen GLUCOSAMINE-CHONDROITIN PO Take 2,000 tablets by mouth once a week. 2000-1200-500 tab      . montelukast (SINGULAIR) 10 MG tablet Take 10 mg by mouth at bedtime.        . Multiple Vitamins-Minerals (MULTIVITAMIN WITH MINERALS) tablet Take 1 tablet by mouth daily.      . Omega-3 Fatty Acids (FISH OIL PO) Take  200 Units by mouth daily.      . ondansetron (ZOFRAN) 4 MG tablet Take 4 mg by mouth every 8 (eight) hours as needed. For nausea      . pantoprazole (PROTONIX) 20 MG tablet Take 40 mg by mouth 2 (two) times daily.      . pravastatin (PRAVACHOL) 20 MG tablet Take 20 mg by mouth every evening.       . risperidone (RISPERDAL) 4 MG tablet Take 4 mg by mouth daily.      . sertraline (ZOLOFT) 50 MG tablet Take 1 tablet by mouth every evening.        No current facility-administered medications for this visit.    Allergies:   Amoxicillin; Diltiazem hcl; Sulfonamide derivatives; and Zetia   Social History:  The patient  reports that she quit smoking about 40 years ago. Her smoking use included Cigarettes. She smoked 0.00 packs per day for 10 years. She has never used smokeless tobacco. She reports that she does not drink alcohol or use illicit drugs.   Family History:  The patient's family history includes Arrhythmia in her mother; Asthma in her sister; Cancer in her brother and sister; Diabetes in her father and sister; Fainting in her mother; Heart disease in her mother; Hypertension in her father and mother; Stroke in her father; Sudden death in her brother.   ROS:  Please see the history of present illness.   She has an occasional cough with yellow sputum. This is improved. Stools are dark secondary to iron. She denies bright red blood per rectum.   All other systems reviewed and negative.   PHYSICAL EXAM: VS:  BP 120/58  Pulse 75  Ht 5' (1.524 m)  Wt 171 lb (77.565 kg)  BMI 33.40 kg/m2 Well nourished, well developed, in no acute distress HEENT: normal Neck:  no JVD Cardiac:  normal S1, S2;  RRR; no murmur Lungs:   clear to auscultation bilaterally, no wheezing, rhonchi or rales Abd: soft, nontender, no hepatomegaly Ext: trace bilateral LE edema Skin: warm and dry Neuro:  CNs 2-12 intact, no focal abnormalities noted  EKG:  NSR, HR 75, LAD, RSR prime, no change from prior tracing      ASSESSMENT AND PLAN:  Shortness of breath:  Etiology not entirely clear. She does have chronic anemia. She has been referred to hematology. She had surgery in February and an admission in June for gastroenteritis. I suspect deconditioning. She does have occasional throat discomfort. I will arrange a Lexiscan Myoview to  rule out ischemia. Obtain a chest x-ray. She does not look volume overloaded on exam. She had significant side effects to furosemide. If workup unrevealing and dyspnea continues, consider referral to pulmonology. She does use albuterol as needed for a history of asthma.  Dizziness:  She describes symptoms of dizziness that are consistent with vertigo. I asked her to followup with primary care. I have provided a one-time prescription for meclizine as needed.  Chronic diastolic heart failure:  Volume stable.  remain off of furosemide.  Pulmonary hypertension:  No significant evidence of pulmonary hypertension on recent echocardiogram. Blood pressure would not tolerate resuming Norvasc. Continue with Norvasc for now.  HYPERTENSION, UNSPECIFIED:  Controlled.  Paroxysmal atrial fibrillation:  Maintaining NSR.   Disposition:  Keep followup with Dr. Meda Coffee as planned.   Signed, Versie Starks, MHS 09/05/2013 2:34 PM    [facility] [address], [place], [place]  [address] Phone: [phone]; Fax: [phone]

## 2013-09-05 NOTE — Patient Instructions (Signed)
START MECLIZINE 25 MG TAB; TAKE 1/-1 WHOLE TABLET TWICE DAILY AS NEEDED FOR DIZZINESS  Your physician has requested that you have a lexiscan myoview. For further information please visit [email or web address]. Please follow instruction sheet, as given.  A chest x-ray takes a picture of the organs and structures inside the chest, including the heart, lungs, and blood vessels. This test can show several things, including, whether the heart is enlarges; whether fluid is building up in the lungs; and whether pacemaker / defibrillator leads are still in place.  FOLLOW UP WITH PRIMARY CARE ABOUT VERTIGO  KEEP APPT WITH DR. Meda Coffee 09/18/2013

## 2013-09-06 ENCOUNTER — Telehealth: Payer: Self-pay | Admitting: *Deleted

## 2013-09-06 NOTE — Telephone Encounter (Signed)
lmom cxr ok, no edema can cb [phone] if any questions

## 2013-09-11 ENCOUNTER — Encounter: Payer: Self-pay | Admitting: Cardiology

## 2013-09-11 ENCOUNTER — Ambulatory Visit: Payer: Medicare Other

## 2013-09-11 ENCOUNTER — Encounter: Payer: Self-pay | Admitting: Hematology and Oncology

## 2013-09-11 ENCOUNTER — Ambulatory Visit (HOSPITAL_BASED_OUTPATIENT_CLINIC_OR_DEPARTMENT_OTHER): Payer: Medicare Other | Admitting: Hematology and Oncology

## 2013-09-11 VITALS — BP 135/55 | HR 71 | Temp 98.2°F | Resp 17 | Ht 60.0 in | Wt 173.1 lb

## 2013-09-11 DIAGNOSIS — R5382 Chronic fatigue, unspecified: Secondary | ICD-10-CM

## 2013-09-11 DIAGNOSIS — I2789 Other specified pulmonary heart diseases: Secondary | ICD-10-CM | POA: Diagnosis not present

## 2013-09-11 DIAGNOSIS — R5383 Other fatigue: Secondary | ICD-10-CM | POA: Insufficient documentation

## 2013-09-11 DIAGNOSIS — D539 Nutritional anemia, unspecified: Secondary | ICD-10-CM

## 2013-09-11 DIAGNOSIS — R5381 Other malaise: Secondary | ICD-10-CM | POA: Diagnosis not present

## 2013-09-11 DIAGNOSIS — I272 Pulmonary hypertension, unspecified: Secondary | ICD-10-CM

## 2013-09-11 NOTE — Assessment & Plan Note (Signed)
This could contribute to her fatigue. Recommend she continue his close cardiology followup.

## 2013-09-11 NOTE — Assessment & Plan Note (Signed)
The cause of her anemia was multifactorial, likely due to blood loss related to orthopedic surgery, anemia of chronic disease and transient acute renal failure. Her last blood work showed improvement of anemia over 11 g. She does not need further workup.

## 2013-09-11 NOTE — Progress Notes (Signed)
[facility] CONSULT NOTE  Patient Care Team: Antony Blackbird, MD as PCP - General (Family Medicine) Antony Blackbird, MD (Family Medicine)  CHIEF COMPLAINTS/PURPOSE OF CONSULTATION:  Anemia and fatigue  HISTORY OF PRESENTING ILLNESS:  [name] 80 y.o. female is here because of recent severe anemia.  The patient had recurrent hospitalization for multiple problems. She had knee replacement surgery approximately 6 months ago needed nursing home rehabilitation. Subsequently, she had gastroenteritis causing severe dehydration and brief episode of acute renal failure. Subsequently, she developed mild congestive heart failure requiring aggressive diuretic therapy. She was also found to be profoundly anemic requiring blood transfusion. She denies recent chest pain on exertion. She has profound weakness and can barely ambulate around home. She has complaints of feeling dizzy with shortness of breath on minimal exertion. She had not noticed any recent bleeding such as epistaxis, hematuria or hematochezia The patient denies over the counter NSAID ingestion. She is on antiplatelets agents. Her last colonoscopy was in 2005 and it was negative. She had no prior history or diagnosis of cancer. Her age appropriate screening programs are up-to-date. She denies any pica and eats a variety of diet. She never donated blood. The patient was prescribed oral iron supplements and she takes 1 iron tablet daily.  MEDICAL HISTORY:  Past Medical History  Diagnosis Date  . Atrial fibrillation   . Palpitations   . Unspecified essential hypertension   . Personal history of other diseases of digestive system   . Other abnormal glucose   . Odynophagia   . Anginal pain     pressure and flutters  . Shortness of breath     with activity  . Pneumonia     hx of  . Arthritis   . Gout   . Diverticulitis   . DVT (deep venous thrombosis)     from birth control, left groin  . Cataract   .  Hypercholesteremia     controlled  . GERD (gastroesophageal reflux disease)   . Depression   . Asthma     no problems recent  . Family history of anesthesia complication 30 y.a.    nausea and vomiting (son)  . Headache(784.0)     migraines "as a girl" (not in > 40 yrs)  . Cancer 2014    skin R elbow & face  . Anemia     "I stay anemic"  . Fibromyalgia 10 y.a.  . Blood dyscrasia age 42    blood clot in groin, PE, "was taking hormones"    SURGICAL HISTORY: Past Surgical History  Procedure Laterality Date  . Laryngoscopy / bronchoscopy / esophagoscopy  2010    Dr. Benjamine Mola  . Laminectomy  05/2007    foraminotomy, L4-5 laminectomy  . Cholecystectomy  10/2005    Dr. Effie Shy  . Colonoscopy w/ biopsies  2005, 2011    Dr. Bing Plume, "balloon inside for last one at [facility]"  . Skin cancer excision Right ~2005    r elbow  . Tubal ligation    . Appendectomy  ~55years ago  . Abdominal hysterectomy      partial  . Tumor removal Right ~ 20 years ago    tumor in between toes  . Knee arthroscopy Right   . Total knee arthroplasty Left 04/04/2013    Procedure: LEFT TOTAL KNEE ARTHROPLASTY;  Surgeon: Tobi Bastos, MD;  Location: [facility];  Service: Orthopedics;  Laterality: Left;    SOCIAL HISTORY: History   Social History  .  Marital Status: Widowed    Spouse Name: N/A    Number of Children: N/A  . Years of Education: N/A   Occupational History  . Not on file.   Social History Main Topics  . Smoking status: Former Smoker -- 10 years    Types: Cigarettes    Quit date: 02/08/1973  . Smokeless tobacco: Never Used  . Alcohol Use: No  . Drug Use: No  . Sexual Activity: No   Other Topics Concern  . Not on file   Social History Narrative  . No narrative on file    FAMILY HISTORY: Family History  Problem Relation Age of Onset  . Heart disease Mother   . Diabetes Father   . Stroke Father   . Cancer Sister   . Cancer Brother   . Diabetes Sister   . Hypertension  Mother   . Hypertension Father   . Sudden death Brother   . Fainting Mother   . Asthma Sister   . Arrhythmia Mother     ALLERGIES:  is allergic to amoxicillin; diltiazem hcl; sulfonamide derivatives; and zetia.  MEDICATIONS:  Current Outpatient Prescriptions  Medication Sig Dispense Refill  . albuterol (PROVENTIL HFA;VENTOLIN HFA) 108 (90 BASE) MCG/ACT inhaler Inhale 1 puff into the lungs every 6 (six) hours as needed for wheezing or shortness of breath.      Marland Kitchen alendronate (FOSAMAX) 10 MG tablet Take 10 mg by mouth daily before breakfast. Take with a full glass of water on an empty stomach.      Marland Kitchen aspirin EC 81 MG tablet Take 81 mg by mouth daily.      . Cholecalciferol (VITAMIN D3) 2000 UNITS TABS Take 2,000 tablets by mouth daily.      . Coenzyme Q10 (CO Q 10 PO) Take 200 mg by mouth daily.      . colchicine 0.6 MG tablet Take 1 tablet by mouth every evening.      . ferrous sulfate 325 (65 FE) MG tablet Take 325 mg by mouth every evening.       . meclizine (ANTIVERT) 25 MG tablet TAKE 1/2-1 WHOLE TABLET TWICE DAILY AS NEEDED FOR DIZZINESS  20 tablet  0  . montelukast (SINGULAIR) 10 MG tablet Take 10 mg by mouth at bedtime.        . Multiple Vitamins-Minerals (MULTIVITAMIN WITH MINERALS) tablet Take 1 tablet by mouth daily.      . Omega-3 Fatty Acids (FISH OIL PO) Take 200 Units by mouth daily.      . ondansetron (ZOFRAN) 4 MG tablet Take 4 mg by mouth every 8 (eight) hours as needed. For nausea      . pantoprazole (PROTONIX) 20 MG tablet Take 40 mg by mouth 2 (two) times daily.      . pravastatin (PRAVACHOL) 20 MG tablet Take 20 mg by mouth every evening.       . risperidone (RISPERDAL) 4 MG tablet Take 4 mg by mouth daily.      . sertraline (ZOLOFT) 50 MG tablet Take 1 tablet by mouth every evening.        No current facility-administered medications for this visit.    REVIEW OF SYSTEMS:   Constitutional: Denies fevers, chills or abnormal night sweats Eyes: Denies blurriness of  vision, double vision or watery eyes Ears, nose, mouth, throat, and face: Denies mucositis or sore throat Gastrointestinal:  Denies nausea, heartburn or change in bowel habits Skin: Denies abnormal skin rashes Lymphatics: Denies new lymphadenopathy or easy  bruising Neurological:Denies numbness, tingling  Behavioral/Psych: Mood is stable, no new changes  All other systems were reviewed with the patient and are negative.  PHYSICAL EXAMINATION: ECOG PERFORMANCE STATUS: 2 - Symptomatic, <50% confined to bed  Filed Vitals:   09/11/13 1230  BP: 135/55  Pulse: 71  Temp: 98.2 F (36.8 C)  Resp: 17   Filed Weights   09/11/13 1230  Weight: 173 lb 1.6 oz (78.518 kg)    GENERAL:alert, no distress and comfortable SKIN: skin color, texture, turgor are normal, no rashes or significant lesions EYES: normal, conjunctiva are pink and non-injected, sclera clear OROPHARYNX:no exudate, no erythema and lips, buccal mucosa, and tongue normal  NECK: supple, thyroid normal size, non-tender, without nodularity LYMPH:  no palpable lymphadenopathy in the cervical, axillary or inguinal LUNGS: clear to auscultation and percussion with normal breathing effort HEART: regular rate & rhythm and no murmurs and moderate bilateral lower extremity edema ABDOMEN:abdomen soft, non-tender and normal bowel sounds Musculoskeletal:no cyanosis of digits and no clubbing  PSYCH: alert & oriented x 3 with fluent speech NEURO: no focal motor/sensory deficits  LABORATORY DATA: Her latest CBC from 08/30/2013 show hemoglobin of 11.2 with normal white cell count and platelet count. I have reviewed the data as listed Lab Results  Component Value Date   WBC 3.2* 07/21/2013   HGB 8.3* 07/21/2013   HCT 26.4* 07/21/2013   MCV 95.0 07/21/2013   PLT 128* 07/21/2013    ASSESSMENT & PLAN:  Unspecified deficiency anemia The cause of her anemia was multifactorial, likely due to blood loss related to orthopedic surgery, anemia of  chronic disease and transient acute renal failure. Her last blood work showed improvement of anemia over 11 g. She does not need further workup.   Fatigue She has profound fatigue and I do not believe this is due to the anemia. Has recent hemoglobin was over 11 g. She has borderline low blood pressure and I suspect this could be related to her medications and deconditioning. I recommend she discuss medication adjustment with her cardiologist. I also recommend rehab.  Pulmonary hypertension This could contribute to her fatigue. Recommend she continue his close cardiology followup.      All questions were answered. The patient knows to call the clinic with any problems, questions or concerns. I spent 30 minutes counseling the patient face to face. The total time spent in the appointment was 40 minutes and more than 50% was on counseling.     [facility], Smrithi Pigford, MD 09/11/2013 9:22 PM

## 2013-09-11 NOTE — Assessment & Plan Note (Signed)
She has profound fatigue and I do not believe this is due to the anemia. Has recent hemoglobin was over 11 g. She has borderline low blood pressure and I suspect this could be related to her medications and deconditioning. I recommend she discuss medication adjustment with her cardiologist. I also recommend rehab.

## 2013-09-13 ENCOUNTER — Ambulatory Visit (HOSPITAL_COMMUNITY): Payer: Medicare Other | Attending: Physician Assistant | Admitting: Radiology

## 2013-09-13 ENCOUNTER — Telehealth: Payer: Self-pay | Admitting: *Deleted

## 2013-09-13 VITALS — BP 143/72 | HR 63 | Ht 60.0 in | Wt 169.0 lb

## 2013-09-13 DIAGNOSIS — R0602 Shortness of breath: Secondary | ICD-10-CM

## 2013-09-13 DIAGNOSIS — R079 Chest pain, unspecified: Secondary | ICD-10-CM | POA: Diagnosis not present

## 2013-09-13 DIAGNOSIS — Z8249 Family history of ischemic heart disease and other diseases of the circulatory system: Secondary | ICD-10-CM | POA: Diagnosis not present

## 2013-09-13 MED ORDER — REGADENOSON 0.4 MG/5ML IV SOLN
0.4000 mg | Freq: Once | INTRAVENOUS | Status: AC
  Administered 2013-09-13: 0.4 mg via INTRAVENOUS

## 2013-09-13 MED ORDER — TECHNETIUM TC 99M SESTAMIBI GENERIC - CARDIOLITE
10.0000 | Freq: Once | INTRAVENOUS | Status: AC | PRN
  Administered 2013-09-13: 10 via INTRAVENOUS

## 2013-09-13 MED ORDER — TECHNETIUM TC 99M SESTAMIBI GENERIC - CARDIOLITE
30.0000 | Freq: Once | INTRAVENOUS | Status: AC | PRN
  Administered 2013-09-13: 30 via INTRAVENOUS

## 2013-09-13 NOTE — Telephone Encounter (Signed)
Message copied by Nuala Alpha on Thu Sep 13, 2013  1:17 PM ------      Message from: Dorothy Spark      Created: Wed Sep 12, 2013  6:17 PM      Regarding: FW: mutual patient       Would you refer this patient for cardiac rehab?      Thank you,      K            ----- Message -----         From: Heath Lark, MD         Sent: 09/11/2013  12:50 PM           To: Dorothy Spark, MD      Subject: mutual patient                                           I saw this mutual patient today. Her anemia is better but she is still profoundly weak. I think this is due to borderline low BP and element of deconditioning. I appreciate your help with medication adjustment & possible cardiac rehab?            Thanks,      Ni        ------

## 2013-09-13 NOTE — Telephone Encounter (Signed)
Cardiac rehab papers are prepared and ready for Dr Meda Coffee to review and prescribe with signature.

## 2013-09-13 NOTE — Progress Notes (Signed)
[facility] [address] [place], [place] [address] [phone]    Cardiology Nuclear Med Study  [name] is a 80 y.o. female     MRN : [id number]     DOB: 04/24/1927  Procedure Date: 09/13/2013  Nuclear Med Background Indication for Stress Test:  Evaluation for Ischemia History:  No known CAD, Echo 2015 EF 55-60%, MPI 2008 (normal) EF 75%, Asthma Cardiac Risk Factors: Family History - CAD, History of Smoking, Hypertension and Lipids  Symptoms:  DOE   Nuclear Pre-Procedure Caffeine/Decaff Intake:  None > 12 hrs NPO After: 7:00pm   Lungs:  clear O2 Sat: 96% on room air. IV 0.9% NS with Angio Cath:  22g  IV Site: L Antecubital x 1, tolerated well IV Started by:  Irven Baltimore, RN  Chest Size (in):  38 Cup Size: D  Height: 5' (1.524 m)  Weight:  169 lb (76.658 kg)  BMI:  Body mass index is 33.01 kg/(m^2). Tech Comments:  N/A    Nuclear Med Study 1 or 2 day study: 1 day  Stress Test Type:  Lexiscan  Reading MD: N/A  Order Authorizing Provider:  Ena Dawley, MD, and Richardson Dopp, PAC  Resting Radionuclide: Technetium 66m Sestamibi  Resting Radionuclide Dose: 11.0 mCi   Stress Radionuclide:  Technetium 66m Sestamibi  Stress Radionuclide Dose: 33.0 mCi           Stress Protocol Rest HR: 63 Stress HR: 86  Rest BP: 143/72 Stress BP: 120/48  Exercise Time (min): n/a METS: n/a           Dose of Adenosine (mg):  n/a Dose of Lexiscan: 0.4 mg  Dose of Atropine (mg): n/a Dose of Dobutamine: n/a mcg/kg/min (at max HR)  Stress Test Technologist: Glade Lloyd, BS-ES  Nuclear Technologist:  Vedia Pereyra, CNMT     Rest Procedure:  Myocardial perfusion imaging was performed at rest 45 minutes following the intravenous administration of Technetium 64m Sestamibi. Rest ECG: NSR - Normal EKG  Stress Procedure:  The patient received IV Lexiscan 0.4 mg over 15-seconds.  Technetium 23m Sestamibi injected at 30-seconds.  Quantitative spect  images were obtained after a 45 minute delay.  During the infusion of Lexiscan the patient complained of SOB, dizziness and head pressure.  These symptoms began to resolve in recovery.  Stress ECG: No significant change from baseline ECG  QPS Raw Data Images:  Mild breast attenuation.  Normal left ventricular size. Stress Images:  There is decreased uptake in the apex. Rest Images:  There is decreased uptake in the apex. Subtraction (SDS):  No evidence of ischemia. Transient Ischemic Dilatation (Normal <1.22):  0.85 Lung/Heart Ratio (Normal <0.45):  0.30  Quantitative Gated Spect Images QGS EDV:  74 ml QGS ESV:  23 ml  Impression Exercise Capacity:  Lexiscan with no exercise. BP Response:  Normal blood pressure response. Clinical Symptoms:  No significant symptoms noted. ECG Impression:  No significant ECG changes with Lexiscan. Comparison with Prior Nuclear Study: No previous nuclear study performed  Overall Impression:  Low risk stress nuclear study with apical attenuation artifact, worse at rest than stress. No reversible ischemia.  LV Ejection Fraction: 69%.  LV Wall Motion:  Normal Wall Motion  Pixie Casino, MD, [facility] Board Certified in Nuclear Cardiology Attending Cardiologist [place]

## 2013-09-14 ENCOUNTER — Encounter: Payer: Self-pay | Admitting: Physician Assistant

## 2013-09-17 DIAGNOSIS — N183 Chronic kidney disease, stage 3 unspecified: Secondary | ICD-10-CM | POA: Diagnosis not present

## 2013-09-18 ENCOUNTER — Ambulatory Visit (INDEPENDENT_AMBULATORY_CARE_PROVIDER_SITE_OTHER): Payer: Medicare Other | Admitting: Cardiology

## 2013-09-18 ENCOUNTER — Encounter: Payer: Self-pay | Admitting: Cardiology

## 2013-09-18 VITALS — BP 110/70 | HR 64 | Ht 60.0 in | Wt 171.8 lb

## 2013-09-18 DIAGNOSIS — R0989 Other specified symptoms and signs involving the circulatory and respiratory systems: Secondary | ICD-10-CM | POA: Diagnosis not present

## 2013-09-18 DIAGNOSIS — R0609 Other forms of dyspnea: Secondary | ICD-10-CM

## 2013-09-18 DIAGNOSIS — R0600 Dyspnea, unspecified: Secondary | ICD-10-CM

## 2013-09-18 DIAGNOSIS — I5032 Chronic diastolic (congestive) heart failure: Secondary | ICD-10-CM | POA: Diagnosis not present

## 2013-09-18 DIAGNOSIS — I10 Essential (primary) hypertension: Secondary | ICD-10-CM

## 2013-09-18 NOTE — Patient Instructions (Addendum)
Your physician recommends that you continue on your current medications as directed. Please refer to the Current Medication list given to you today.  DR Meda Coffee HAS REFERRED YOU TO CARDIAC REHAB AT [place] FAXED [place] ON 8/11  Your physician wants you to follow-up in: [place] will receive a reminder letter in the mail two months in advance. If you don't receive a letter, please call our office to schedule the follow-up appointment.

## 2013-09-18 NOTE — Progress Notes (Signed)
Patient ID: [name], female   DOB: 01-28-1928, 80 y.o.   MRN: [id number]    Cardiology Office Note    Date:  09/18/2013   ID:  [name], DOB [birth year], MRN [id number]  PCP:  Antony Blackbird, MD  Cardiologist:  Dr. Jenell Milliner >>> Dr. Ena Dawley      History of Present Illness: [name] is a 80 y.o. female with a history of paroxysmal atrial fibrillation, mild pulmonary hypertension, HTN, HL. She has a history of bradycardia with beta blockers and diltiazem.  She established with Dr. Meda Coffee in 02/2013.  She had knee surgery in February. Admitted in 07/2013 with gastroenteritis and dehydration.  With hydration, she developed volume excess and was placed on Lasix. Last seen by Dr. Meda Coffee 08/08/13 and edema was improved. Diuretic regimen was continued. Follow up echocardiogram was arranged. This demonstrated normal LVF, mild diastolic dysfunction.  She was brought back today for follow up in hopes that she would resume amlodipine (held for low BP during gastroenteritis) if her blood pressure will allow.  Patient stopped furosemide secondary to weakness and dizziness. Weakness is better. Dizziness is not improved. She notes spinning sensation with changes in head position. She notes dyspnea with exertion. She describes NYHA 2b-3 symptoms. She denies chest pain. She does describe occasional throat discomfort. She is not certain if this occurs with exertion. She has slept on an incline for years. She denies PND. LE edema is improved. She denies syncope.  09/17/2013 - patient is coming after her stress test is completely normal showed normal left ventricular function no prior myocardial infarction and no ischemia. She states that she still feels significant higher and short of breath with minimal exertion. On first arteries that she got gastroenteritis in March after which she was aggressively hydrated and the loop of diastolic CHF she a slow down or stop exercising and doing any  activity at all and since then she has been feeling very tired.   Studies:  - Echo (08/24/13):  Mild LVH, EF 0000000, grade 1 diastolic dysfunction, mild MR, severe LAE  - Nuclear (02/2006):  EF 75%, no ischemia, normal study   Recent Labs: 03/05/2013: TSH 0.48  07/20/2013: ALT 10  07/21/2013: Hemoglobin 8.3*  08/24/2013: Creatinine 1.2; Potassium 4.7; Pro B Natriuretic peptide (BNP) 113.0*   Wt Readings from Last 3 Encounters:  09/18/13 171 lb 12.8 oz (77.928 kg)  09/13/13 169 lb (76.658 kg)  09/11/13 173 lb 1.6 oz (78.518 kg)     Past Medical History  Diagnosis Date  . Atrial fibrillation   . Palpitations   . Unspecified essential hypertension   . Personal history of other diseases of digestive system   . Other abnormal glucose   . Odynophagia   . Anginal pain     pressure and flutters  . Shortness of breath     with activity  . Pneumonia     hx of  . Arthritis   . Gout   . Diverticulitis   . DVT (deep venous thrombosis)     from birth control, left groin  . Cataract   . Hypercholesteremia     controlled  . GERD (gastroesophageal reflux disease)   . Depression   . Asthma     no problems recent  . Family history of anesthesia complication 30 y.a.    nausea and vomiting (son)  . Headache(784.0)     migraines "as a girl" (not in > 40 yrs)  . Cancer 2014  skin R elbow & face  . Anemia     "I stay anemic"  . Fibromyalgia 10 y.a.  . Blood dyscrasia age 63    blood clot in groin, PE, "was taking hormones"  . Hx of cardiovascular stress test     Lexiscan Myoview (8/15):  apical attenuation artifact, no ischemia, EF 69% - low risk    Current Outpatient Prescriptions  Medication Sig Dispense Refill  . albuterol (PROVENTIL HFA;VENTOLIN HFA) 108 (90 BASE) MCG/ACT inhaler Inhale 1 puff into the lungs every 6 (six) hours as needed for wheezing or shortness of breath.      Marland Kitchen alendronate (FOSAMAX) 10 MG tablet Take 10 mg by mouth once a week. Takes on Thursdays. Take  with a full glass of water on an empty stomach.      Marland Kitchen aspirin EC 81 MG tablet Take 81 mg by mouth daily.      . Cholecalciferol (VITAMIN D3) 2000 UNITS TABS Take 2,000 Units by mouth daily.       . Coenzyme Q10 (CO Q 10 PO) Take 200 mg by mouth daily. Pt states she does not take this everyday (09/18/13)      . colchicine 0.6 MG tablet Take 1 tablet by mouth every evening.      . ferrous sulfate 325 (65 FE) MG tablet Take 325 mg by mouth every evening.       . meclizine (ANTIVERT) 25 MG tablet TAKE 1/2-1 WHOLE TABLET TWICE DAILY AS NEEDED FOR DIZZINESS  20 tablet  0  . montelukast (SINGULAIR) 10 MG tablet Take 10 mg by mouth at bedtime.        . Multiple Vitamins-Minerals (MULTIVITAMIN WITH MINERALS) tablet Take 1 tablet by mouth daily.      . Omega-3 Fatty Acids (FISH OIL PO) Take 200 Units by mouth daily.      . ondansetron (ZOFRAN) 4 MG tablet Take 4 mg by mouth every 8 (eight) hours as needed. For nausea      . pantoprazole (PROTONIX) 20 MG tablet Take 40 mg by mouth 2 (two) times daily.      . sertraline (ZOLOFT) 50 MG tablet Take 1 tablet by mouth every evening.        No current facility-administered medications for this visit.    Allergies:   Amoxicillin; Diltiazem hcl; Sulfonamide derivatives; and Zetia   Social History:  The patient  reports that she quit smoking about 40 years ago. Her smoking use included Cigarettes. She smoked 0.00 packs per day for 10 years. She has never used smokeless tobacco. She reports that she does not drink alcohol or use illicit drugs.   Family History:  The patient's family history includes Arrhythmia in her mother; Asthma in her sister; Cancer in her brother and sister; Diabetes in her father and sister; Fainting in her mother; Heart disease in her mother; Hypertension in her father and mother; Stroke in her father; Sudden death in her brother.   ROS:  Please see the history of present illness.   She has an occasional cough with yellow sputum. This is  improved. Stools are dark secondary to iron. She denies bright red blood per rectum.   All other systems reviewed and negative.   PHYSICAL EXAM: VS:  BP 110/70  Pulse 64  Ht 5' (1.524 m)  Wt 171 lb 12.8 oz (77.928 kg)  BMI 33.55 kg/m2 Well nourished, well developed, in no acute distress HEENT: normal Neck:  no JVD Cardiac:  normal S1, S2;  RRR; no murmur Lungs:   clear to auscultation bilaterally, no wheezing, rhonchi or rales Abd: soft, nontender, no hepatomegaly Ext: trace bilateral LE edema Skin: warm and dry Neuro:  CNs 2-12 intact, no focal abnormalities noted  EKG:  NSR, HR 75, LAD, RSR prime, no change from prior tracing     ASSESSMENT AND PLAN:  Shortness of breath:  So far workup has been negative with a normal hematology workup. No ischemia or prior infarction stress test with normal left ventricular ejection fraction. I believe patient is significantly deconditioned and will schedule for cardiac rehabilitation.  Dizziness:  She is advised to stop using meclizine as that can contribute to her fatigue.  Chronic diastolic heart failure:  Volume stable.  remain off of furosemide.  Pulmonary hypertension:  No significant evidence of pulmonary hypertension on recent echocardiogram. Blood pressure would not tolerate resuming Norvasc. Continue with Norvasc for now.  HYPERTENSION, UNSPECIFIED:  Controlled.  Paroxysmal atrial fibrillation:  Maintaining NSR.   Follow up in 6 months  Dorothy Spark 09/18/2013

## 2013-09-19 ENCOUNTER — Other Ambulatory Visit: Payer: Self-pay | Admitting: Family Medicine

## 2013-09-19 DIAGNOSIS — R42 Dizziness and giddiness: Secondary | ICD-10-CM

## 2013-09-27 ENCOUNTER — Telehealth (HOSPITAL_COMMUNITY): Payer: Self-pay | Admitting: *Deleted

## 2013-09-27 NOTE — Telephone Encounter (Signed)
Talked with pt regarding cardiac rehab.  Pt informed that she did not qualify for phase II cardiac rehab under Medicare reimbursement.  Pt would qualify for maintenance which is self pay 68.00 a month for 3 x week. Pt asked for rehab to call and talk to her daughter, she didn't think she could afford the program.  Message left on daughter's answering machine to please contact rehab.  Contact information left.

## 2013-11-07 DIAGNOSIS — L821 Other seborrheic keratosis: Secondary | ICD-10-CM | POA: Diagnosis not present

## 2013-11-07 DIAGNOSIS — L570 Actinic keratosis: Secondary | ICD-10-CM | POA: Diagnosis not present

## 2013-11-07 DIAGNOSIS — Z85828 Personal history of other malignant neoplasm of skin: Secondary | ICD-10-CM | POA: Diagnosis not present

## 2013-11-07 DIAGNOSIS — C44319 Basal cell carcinoma of skin of other parts of face: Secondary | ICD-10-CM | POA: Diagnosis not present

## 2013-12-06 DIAGNOSIS — Z23 Encounter for immunization: Secondary | ICD-10-CM | POA: Diagnosis not present

## 2013-12-12 DIAGNOSIS — K219 Gastro-esophageal reflux disease without esophagitis: Secondary | ICD-10-CM | POA: Diagnosis not present

## 2013-12-12 DIAGNOSIS — D509 Iron deficiency anemia, unspecified: Secondary | ICD-10-CM | POA: Diagnosis not present

## 2013-12-12 DIAGNOSIS — R7301 Impaired fasting glucose: Secondary | ICD-10-CM | POA: Diagnosis not present

## 2013-12-12 DIAGNOSIS — I129 Hypertensive chronic kidney disease with stage 1 through stage 4 chronic kidney disease, or unspecified chronic kidney disease: Secondary | ICD-10-CM | POA: Diagnosis not present

## 2013-12-12 DIAGNOSIS — R8290 Unspecified abnormal findings in urine: Secondary | ICD-10-CM | POA: Diagnosis not present

## 2013-12-12 DIAGNOSIS — I10 Essential (primary) hypertension: Secondary | ICD-10-CM | POA: Diagnosis not present

## 2013-12-12 DIAGNOSIS — E785 Hyperlipidemia, unspecified: Secondary | ICD-10-CM | POA: Diagnosis not present

## 2013-12-12 DIAGNOSIS — Z23 Encounter for immunization: Secondary | ICD-10-CM | POA: Diagnosis not present

## 2013-12-12 DIAGNOSIS — M2550 Pain in unspecified joint: Secondary | ICD-10-CM | POA: Diagnosis not present

## 2013-12-31 ENCOUNTER — Other Ambulatory Visit: Payer: Self-pay

## 2013-12-31 DIAGNOSIS — Z1231 Encounter for screening mammogram for malignant neoplasm of breast: Secondary | ICD-10-CM

## 2014-01-21 ENCOUNTER — Ambulatory Visit
Admission: RE | Admit: 2014-01-21 | Discharge: 2014-01-21 | Disposition: A | Discharge status: Home / Self Care | Payer: Medicare Other | Source: Ambulatory Visit

## 2014-01-21 DIAGNOSIS — Z1231 Encounter for screening mammogram for malignant neoplasm of breast: Secondary | ICD-10-CM | POA: Diagnosis not present

## 2014-02-25 DIAGNOSIS — H903 Sensorineural hearing loss, bilateral: Secondary | ICD-10-CM | POA: Diagnosis not present

## 2014-03-07 DIAGNOSIS — H25813 Combined forms of age-related cataract, bilateral: Secondary | ICD-10-CM | POA: Diagnosis not present

## 2014-03-19 ENCOUNTER — Ambulatory Visit (INDEPENDENT_AMBULATORY_CARE_PROVIDER_SITE_OTHER): Payer: Medicare Other | Admitting: Cardiology

## 2014-03-19 ENCOUNTER — Encounter: Payer: Self-pay | Admitting: Cardiology

## 2014-03-19 VITALS — BP 122/72 | HR 63 | Ht 60.0 in | Wt 168.0 lb

## 2014-03-19 DIAGNOSIS — I5032 Chronic diastolic (congestive) heart failure: Secondary | ICD-10-CM | POA: Diagnosis not present

## 2014-03-19 DIAGNOSIS — R0609 Other forms of dyspnea: Secondary | ICD-10-CM

## 2014-03-19 DIAGNOSIS — I480 Paroxysmal atrial fibrillation: Secondary | ICD-10-CM

## 2014-03-19 DIAGNOSIS — R0600 Dyspnea, unspecified: Secondary | ICD-10-CM

## 2014-03-19 DIAGNOSIS — R0602 Shortness of breath: Secondary | ICD-10-CM | POA: Insufficient documentation

## 2014-03-19 HISTORY — DX: Chronic diastolic (congestive) heart failure: I50.32

## 2014-03-19 NOTE — Progress Notes (Signed)
Patient ID: [name], female   DOB: October 27, 1927, 81 y.o.   MRN: [id number]    Cardiology Office Note    Date:  03/19/2014   ID:  [name], DOB 1927/05/30, MRN [id number]  PCP:  Antony Blackbird, MD  Cardiologist:  Dr. Jenell Milliner >>> Dr. Ena Dawley      History of Present Illness: [name] is a 81 y.o. female with a history of paroxysmal atrial fibrillation, mild pulmonary hypertension, HTN, HL. She has a history of bradycardia with beta blockers and diltiazem.  She established with Dr. Meda Coffee in 02/2013.  She had knee surgery in February. Admitted in 07/2013 with gastroenteritis and dehydration.  With hydration, she developed volume excess and was placed on Lasix. Last seen by Dr. Meda Coffee 08/08/13 and edema was improved. Diuretic regimen was continued. Follow up echocardiogram was arranged. This demonstrated normal LVF, mild diastolic dysfunction.  She was brought back today for follow up in hopes that she would resume amlodipine (held for low BP during gastroenteritis) if her blood pressure will allow.  Patient stopped furosemide secondary to weakness and dizziness. Weakness is better. Dizziness is not improved. She notes spinning sensation with changes in head position. She notes dyspnea with exertion. She describes NYHA 2b-3 symptoms. She denies chest pain. She does describe occasional throat discomfort. She is not certain if this occurs with exertion. She has slept on an incline for years. She denies PND. LE edema is improved. She denies syncope.  09/17/2013 - patient is coming after her stress test is completely normal showed normal left ventricular function no prior myocardial infarction and no ischemia. She states that she still feels significant higher and short of breath with minimal exertion. On first arteries that she got gastroenteritis in March after which she was aggressively hydrated and the loop of diastolic CHF she a slow down or stop exercising and doing any  activity at all and since then she has been feeling very tired.  03/19/2014 - the patient is coming after 6 months suggest 6 months, she states she feels great. Ever since we stopped Lasix her weakness and dizziness has resolved. She stays active, she lives with her daughter and helps cleaning and cocaine in the house. She does laundry as well and takes care of her 79 year old handicapped son. She states that on the days when she doesn't do anything she actually feels more tired.   Studies:  - Echo (08/24/13):  Mild LVH, EF 0000000, grade 1 diastolic dysfunction, mild MR, severe LAE  - Nuclear (02/2006):  EF 75%, no ischemia, normal study   Recent Labs: 07/20/2013: ALT 10 07/21/2013: Hemoglobin 8.3* 08/24/2013: Creatinine 1.2; Potassium 4.7; Pro B Natriuretic peptide (BNP) 113.0*  Wt Readings from Last 3 Encounters:  03/19/14 168 lb (76.204 kg)  09/18/13 171 lb 12.8 oz (77.928 kg)  09/13/13 169 lb (76.658 kg)     Past Medical History  Diagnosis Date  . Atrial fibrillation   . Palpitations   . Unspecified essential hypertension   . Personal history of other diseases of digestive system   . Other abnormal glucose   . Odynophagia   . Anginal pain     pressure and flutters  . Shortness of breath     with activity  . Pneumonia     hx of  . Arthritis   . Gout   . Diverticulitis   . DVT (deep venous thrombosis)     from birth control, left groin  . Cataract   .  Hypercholesteremia     controlled  . GERD (gastroesophageal reflux disease)   . Depression   . Asthma     no problems recent  . Family history of anesthesia complication 30 y.a.    nausea and vomiting (son)  . Headache(784.0)     migraines "as a girl" (not in > 40 yrs)  . Cancer 2014    skin R elbow & face  . Anemia     "I stay anemic"  . Fibromyalgia 10 y.a.  . Blood dyscrasia age 53    blood clot in groin, PE, "was taking hormones"  . Hx of cardiovascular stress test     Lexiscan Myoview (8/15):  apical  attenuation artifact, no ischemia, EF 69% - low risk    Current Outpatient Prescriptions  Medication Sig Dispense Refill  . albuterol (PROVENTIL HFA;VENTOLIN HFA) 108 (90 BASE) MCG/ACT inhaler Inhale 1 puff into the lungs every 6 (six) hours as needed for wheezing or shortness of breath.    Marland Kitchen alendronate (FOSAMAX) 70 MG tablet Take 1 tablet by mouth once a week.    Marland Kitchen aspirin EC 81 MG tablet Take 81 mg by mouth daily.    . Cholecalciferol (VITAMIN D3) 2000 UNITS TABS Take 2,000 Units by mouth daily.     . Coenzyme Q10 (CO Q 10 PO) Take 200 mg by mouth daily. Pt states she does not take this everyday (09/18/13)    . ferrous sulfate 325 (65 FE) MG tablet Take 325 mg by mouth every evening.     . meclizine (ANTIVERT) 25 MG tablet TAKE 1/2-1 WHOLE TABLET TWICE DAILY AS NEEDED FOR DIZZINESS 20 tablet 0  . montelukast (SINGULAIR) 10 MG tablet Take 10 mg by mouth at bedtime.      . Multiple Vitamins-Minerals (MULTIVITAMIN WITH MINERALS) tablet Take 1 tablet by mouth daily.    . Omega-3 Fatty Acids (FISH OIL PO) Take 200 Units by mouth daily.    . ondansetron (ZOFRAN) 4 MG tablet Take 4 mg by mouth every 8 (eight) hours as needed. For nausea    . pantoprazole (PROTONIX) 20 MG tablet Take 40 mg by mouth 2 (two) times daily.    . sertraline (ZOLOFT) 50 MG tablet Take 1 tablet by mouth every evening.     Marland Kitchen ULORIC 40 MG tablet Take 1 tablet by mouth daily.     No current facility-administered medications for this visit.    Allergies:   Amoxicillin; Diltiazem hcl; Sulfonamide derivatives; and Zetia   Social History:  The patient  reports that she quit smoking about 41 years ago. Her smoking use included Cigarettes. She quit after 10 years of use. She has never used smokeless tobacco. She reports that she does not drink alcohol or use illicit drugs.   Family History:  The patient's family history includes Arrhythmia in her mother; Asthma in her sister; Cancer in her brother and sister; Diabetes in her  father and sister; Fainting in her mother; Heart disease in her mother; Hypertension in her father and mother; Stroke in her father; Sudden death in her brother.   ROS:  Please see the history of present illness.   She has an occasional cough with yellow sputum. This is improved. Stools are dark secondary to iron. She denies bright red blood per rectum.   All other systems reviewed and negative.   PHYSICAL EXAM: VS:  BP 122/72 mmHg  Pulse 63  Ht 5' (1.524 m)  Wt 168 lb (76.204 kg)  BMI 32.81  kg/m2 Well nourished, well developed, in no acute distress HEENT: normal Neck:  no JVD Cardiac:  normal S1, S2;  RRR; no murmur Lungs:   clear to auscultation bilaterally, no wheezing, rhonchi or rales Abd: soft, nontender, no hepatomegaly Ext: trace bilateral LE edema Skin: warm and dry Neuro:  CNs 2-12 intact, no focal abnormalities noted  EKG:  NSR, HR 75, LAD, RSR prime, no change from prior tracing.     ASSESSMENT AND PLAN:  1. Shortness of breath:  So far workup has been negative with a normal hematology workup. No ischemia or prior infarction stress test with normal left ventricular ejection fraction. I believe patient was deconditioned and dehydrated. Feeling better of Lasix.  2. Chronic diastolic heart failure:  Volume stable.  remain off of furosemide.  3. Dizziness:  She is advised to stop using meclizine as that can contribute to her fatigue.  4. Pulmonary hypertension:  No significant evidence of pulmonary hypertension on recent echocardiogram. Blood pressure would not tolerate resuming Norvasc. Continue with Norvasc for now.  5. HYPERTENSION, UNSPECIFIED:  Controlled.  6. Paroxysmal atrial fibrillation:  Maintaining NSR.   Follow up in 6 months  Dorothy Spark 03/19/2014

## 2014-03-19 NOTE — Patient Instructions (Signed)
Your physician recommends that you continue on your current medications as directed. Please refer to the Current Medication list given to you today.     Your physician wants you to follow-up in: 6 MONTHS WITH DR NELSON You will receive a reminder letter in the mail two months in advance. If you don't receive a letter, please call our office to schedule the follow-up appointment.  

## 2014-04-23 DIAGNOSIS — H02839 Dermatochalasis of unspecified eye, unspecified eyelid: Secondary | ICD-10-CM | POA: Diagnosis not present

## 2014-04-23 DIAGNOSIS — H18411 Arcus senilis, right eye: Secondary | ICD-10-CM | POA: Diagnosis not present

## 2014-04-23 DIAGNOSIS — H18412 Arcus senilis, left eye: Secondary | ICD-10-CM | POA: Diagnosis not present

## 2014-04-23 DIAGNOSIS — H2511 Age-related nuclear cataract, right eye: Secondary | ICD-10-CM | POA: Diagnosis not present

## 2014-05-11 DIAGNOSIS — J45909 Unspecified asthma, uncomplicated: Secondary | ICD-10-CM | POA: Diagnosis not present

## 2014-05-18 DIAGNOSIS — R0982 Postnasal drip: Secondary | ICD-10-CM | POA: Diagnosis not present

## 2014-05-18 DIAGNOSIS — J069 Acute upper respiratory infection, unspecified: Secondary | ICD-10-CM | POA: Diagnosis not present

## 2014-06-10 DIAGNOSIS — H25811 Combined forms of age-related cataract, right eye: Secondary | ICD-10-CM | POA: Diagnosis not present

## 2014-06-10 DIAGNOSIS — H2511 Age-related nuclear cataract, right eye: Secondary | ICD-10-CM | POA: Diagnosis not present

## 2014-06-11 DIAGNOSIS — H2512 Age-related nuclear cataract, left eye: Secondary | ICD-10-CM | POA: Diagnosis not present

## 2014-06-28 DIAGNOSIS — H2512 Age-related nuclear cataract, left eye: Secondary | ICD-10-CM | POA: Diagnosis not present

## 2014-06-28 DIAGNOSIS — H25812 Combined forms of age-related cataract, left eye: Secondary | ICD-10-CM | POA: Diagnosis not present

## 2014-08-01 DIAGNOSIS — K5732 Diverticulitis of large intestine without perforation or abscess without bleeding: Secondary | ICD-10-CM | POA: Diagnosis not present

## 2014-08-01 DIAGNOSIS — D649 Anemia, unspecified: Secondary | ICD-10-CM | POA: Diagnosis not present

## 2014-08-01 DIAGNOSIS — R05 Cough: Secondary | ICD-10-CM | POA: Diagnosis not present

## 2014-08-01 DIAGNOSIS — R110 Nausea: Secondary | ICD-10-CM | POA: Diagnosis not present

## 2014-08-01 DIAGNOSIS — M109 Gout, unspecified: Secondary | ICD-10-CM | POA: Diagnosis not present

## 2014-08-19 DIAGNOSIS — I129 Hypertensive chronic kidney disease with stage 1 through stage 4 chronic kidney disease, or unspecified chronic kidney disease: Secondary | ICD-10-CM | POA: Diagnosis not present

## 2014-08-19 DIAGNOSIS — N183 Chronic kidney disease, stage 3 (moderate): Secondary | ICD-10-CM | POA: Diagnosis not present

## 2014-09-14 DIAGNOSIS — J069 Acute upper respiratory infection, unspecified: Secondary | ICD-10-CM | POA: Diagnosis not present

## 2014-09-17 DIAGNOSIS — I4891 Unspecified atrial fibrillation: Secondary | ICD-10-CM | POA: Diagnosis not present

## 2014-09-17 DIAGNOSIS — R42 Dizziness and giddiness: Secondary | ICD-10-CM | POA: Diagnosis not present

## 2014-09-18 ENCOUNTER — Ambulatory Visit (INDEPENDENT_AMBULATORY_CARE_PROVIDER_SITE_OTHER): Payer: Medicare Other | Admitting: Cardiology

## 2014-09-18 ENCOUNTER — Encounter: Payer: Self-pay | Admitting: Cardiology

## 2014-09-18 VITALS — BP 138/82 | HR 67 | Ht 60.0 in | Wt 180.0 lb

## 2014-09-18 DIAGNOSIS — R61 Generalized hyperhidrosis: Secondary | ICD-10-CM | POA: Diagnosis not present

## 2014-09-18 DIAGNOSIS — I480 Paroxysmal atrial fibrillation: Secondary | ICD-10-CM | POA: Diagnosis not present

## 2014-09-18 DIAGNOSIS — R42 Dizziness and giddiness: Secondary | ICD-10-CM

## 2014-09-18 DIAGNOSIS — R002 Palpitations: Secondary | ICD-10-CM | POA: Insufficient documentation

## 2014-09-18 MED ORDER — MECLIZINE HCL 25 MG PO TABS: ORAL_TABLET | ORAL | Status: DC

## 2014-09-18 NOTE — Progress Notes (Signed)
Patient ID: [name], female   DOB: May 22, 1927, 81 y.o.   MRN: [id number]    Cardiology Office Note    Date:  09/18/2014   ID:  MICHEALLA [name], DOB [birth year], MRN [id number]  PCP:  Antony Blackbird, MD  Cardiologist:  Dr. Jenell Milliner >>> Dr. Ena Dawley    Chief complain: dizziness, diaphoresis and palpitations   History of Present Illness: [name] is a 81 y.o. female with a history of paroxysmal atrial fibrillation, mild pulmonary hypertension, HTN, HL. She has a history of bradycardia with beta blockers and diltiazem.  She established with Dr. Meda Coffee in 02/2013.  She had knee surgery in February. Admitted in 07/2013 with gastroenteritis and dehydration.  With hydration, she developed volume excess and was placed on Lasix. Last seen by Dr. Meda Coffee 08/08/13 and edema was improved. Diuretic regimen was continued. Follow up echocardiogram was arranged. This demonstrated normal LVF, mild diastolic dysfunction.  She was brought back today for follow up in hopes that she would resume amlodipine (held for low BP during gastroenteritis) if her blood pressure will allow.  Patient stopped furosemide secondary to weakness and dizziness. Weakness is better. Dizziness is not improved. She notes spinning sensation with changes in head position. She notes dyspnea with exertion. She describes NYHA 2b-3 symptoms. She denies chest pain. She does describe occasional throat discomfort. She is not certain if this occurs with exertion. She has slept on an incline for years. She denies PND. LE edema is improved. She denies syncope.  09/17/2013 - patient is coming after her stress test is completely normal showed normal left ventricular function no prior myocardial infarction and no ischemia. She states that she still feels significant higher and short of breath with minimal exertion. On first arteries that she got gastroenteritis in March after which she was aggressively hydrated and the loop of diastolic  CHF she a slow down or stop exercising and doing any activity at all and since then she has been feeling very tired.  03/19/2014 - the patient is coming after 6 months suggest 6 months, she states she feels great. Ever since we stopped Lasix her weakness and dizziness has resolved. She stays active, she lives with her daughter and helps cleaning and cocaine in the house. She does laundry as well and takes care of her 79 year old handicapped son. She states that on the days when she doesn't do anything she actually feels more tired.  09/18/2014 - the patient is coming after 6 months, she has been doing ok until 2 weeks ago when she developed episodes of palpitations, dizziness, upset stomach followed by diaphoresis. She has been seen by her PCP who prescribed empiric antibiotics. No dysuria, on and off cough, sometimes productive. Mostly at night and early morning. No sick contacts.  Studies:  - Echo (08/24/13):  Mild LVH, EF 0000000, grade 1 diastolic dysfunction, mild MR, severe LAE  - Nuclear (02/2006):  EF 75%, no ischemia, normal study   Recent Labs: No results found for requested labs within last 365 days.  Wt Readings from Last 3 Encounters:  09/18/14 180 lb (81.647 kg)  03/19/14 168 lb (76.204 kg)  09/18/13 171 lb 12.8 oz (77.928 kg)     Past Medical History  Diagnosis Date  . Atrial fibrillation   . Palpitations   . Unspecified essential hypertension   . Personal history of other diseases of digestive system   . Other abnormal glucose   . Odynophagia   . Anginal pain  pressure and flutters  . Shortness of breath     with activity  . Pneumonia     hx of  . Arthritis   . Gout   . Diverticulitis   . DVT (deep venous thrombosis)     from birth control, left groin  . Cataract   . Hypercholesteremia     controlled  . GERD (gastroesophageal reflux disease)   . Depression   . Asthma     no problems recent  . Family history of anesthesia complication 30 y.a.    nausea and  vomiting (son)  . Headache(784.0)     migraines "as a girl" (not in > 40 yrs)  . Cancer 2014    skin R elbow & face  . Anemia     "I stay anemic"  . Fibromyalgia 10 y.a.  . Blood dyscrasia age 53    blood clot in groin, PE, "was taking hormones"  . Hx of cardiovascular stress test     Lexiscan Myoview (8/15):  apical attenuation artifact, no ischemia, EF 69% - low risk  . Chronic diastolic CHF (congestive heart failure), NYHA class 2 03/19/2014    Current Outpatient Prescriptions  Medication Sig Dispense Refill  . acetaminophen (TYLENOL) 325 MG tablet Take 325 mg by mouth every 6 (six) hours as needed for mild pain or headache.    . albuterol (PROVENTIL HFA;VENTOLIN HFA) 108 (90 BASE) MCG/ACT inhaler Inhale 1 puff into the lungs every 6 (six) hours as needed for wheezing or shortness of breath.    Marland Kitchen alendronate (FOSAMAX) 70 MG tablet Take 1 tablet by mouth once a week.    Marland Kitchen aspirin EC 81 MG tablet Take 81 mg by mouth daily.    . Cholecalciferol (VITAMIN D3) 10000 UNITS capsule Take 10,000 Units by mouth daily.    . CHONDROITIN SULFATE PO Take 150 mg by mouth daily.    . Coenzyme Q10 (CO Q 10 PO) Take 200 mg by mouth daily. Pt states she does not take this everyday (09/18/13)    . Ferrous Sulfate 28 MG TABS Take by mouth.    Marland Kitchen glucosamine-chondroitin 500-400 MG tablet Take 1 tablet by mouth daily.    Marland Kitchen HYDROcodone-acetaminophen (NORCO/VICODIN) 5-325 MG per tablet Take 1 tablet by mouth daily as needed for moderate pain.    . montelukast (SINGULAIR) 10 MG tablet Take 10 mg by mouth at bedtime.      . Multiple Vitamins-Minerals (MULTIVITAMIN WITH MINERALS) tablet Take 1 tablet by mouth daily.    . Omega-3 Fatty Acids (FISH OIL BURP-LESS) 1200 MG CAPS Take by mouth.    . ondansetron (ZOFRAN) 4 MG tablet Take 4 mg by mouth every 8 (eight) hours as needed. For nausea    . pantoprazole (PROTONIX) 20 MG tablet Take 40 mg by mouth 2 (two) times daily.    . pravastatin (PRAVACHOL) 20 MG tablet  Take 1 tablet by mouth daily.    . Probiotic Product (PROBIOTIC ADVANCED PO) Take 1 tablet by mouth daily.    . sertraline (ZOLOFT) 50 MG tablet Take 1 tablet by mouth every evening.     Marland Kitchen ULORIC 40 MG tablet Take 1 tablet by mouth daily.     No current facility-administered medications for this visit.    Allergies:   Amoxicillin; Diltiazem hcl; Sulfonamide derivatives; and Zetia   Social History:  The patient  reports that she quit smoking about 41 years ago. Her smoking use included Cigarettes. She quit after 10 years of use.  She has never used smokeless tobacco. She reports that she does not drink alcohol or use illicit drugs.   Family History:  The patient's family history includes Arrhythmia in her mother; Asthma in her sister; Cancer in her brother and sister; Diabetes in her father and sister; Fainting in her mother; Heart disease in her mother; Hypertension in her father and mother; Stroke in her father; Sudden death in her brother.   ROS:  Please see the history of present illness.   She has an occasional cough with yellow sputum. This is improved. Stools are dark secondary to iron. She denies bright red blood per rectum.   All other systems reviewed and negative.   PHYSICAL EXAM: VS:  BP 138/82 mmHg  Pulse 67  Ht 5' (1.524 m)  Wt 180 lb (81.647 kg)  BMI 35.15 kg/m2  SpO2 97% Well nourished, well developed, in no acute distress HEENT: normal Neck:  no JVD Cardiac:  normal S1, S2;  RRR; no murmur Lungs:   clear to auscultation bilaterally, no wheezing, rhonchi or rales Abd: soft, nontender, no hepatomegaly Ext: trace bilateral LE edema Skin: warm and dry Neuro:  CNs 2-12 intact, no focal abnormalities noted  EKG:  NSR, HR 75, LAD, RSR prime, no change from prior tracing.    ASSESSMENT AND PLAN:  1. Palpitations, dizziness diaphoresis - she has h/o paroxysmal atrial fibrillation, we will start 48 hour Holter monitor and restart meclizine.  Her stress test was negative a  year ago.   2. Chronic diastolic heart failure:  Volume stable.  remain off of furosemide.  3. Dizziness:  Restart meclizine.  4. Pulmonary hypertension:  No significant evidence of pulmonary hypertension on recent echocardiogram. Blood pressure would not tolerate resuming Norvasc. Continue with Norvasc for now.  5. HYPERTENSION, UNSPECIFIED:  Controlled.  6. Paroxysmal atrial fibrillation:  Maintaining NSR.   Follow up in 1 week.  Dorothy Spark 09/18/2014

## 2014-09-18 NOTE — Patient Instructions (Signed)
Medication Instructions:    START BACK TAKING YOUR MECLIZINE AS NEEDED FOR DIZZINESS---FOLLOW INSTRUCTIONS ON THE BOTTLE CAREFULLY     Testing/Procedures:  Your physician has recommended that you wear a 48 HOUR holter monitor. Holter monitors are medical devices that record the heart's electrical activity. Doctors most often use these monitors to diagnose arrhythmias. Arrhythmias are problems with the speed or rhythm of the heartbeat. The monitor is a small, portable device. You can wear one while you do your normal daily activities. This is usually used to diagnose what is causing palpitations/syncope (passing out).     Follow-Up:  COME NEXT Wednesday 09/25/14 TO SEE DAYNA DUNN PA-C ON THE FLEX SCHEDULE AT 3PM.

## 2014-09-24 ENCOUNTER — Other Ambulatory Visit: Payer: Self-pay | Admitting: Family Medicine

## 2014-09-24 DIAGNOSIS — R42 Dizziness and giddiness: Secondary | ICD-10-CM

## 2014-09-25 ENCOUNTER — Ambulatory Visit (INDEPENDENT_AMBULATORY_CARE_PROVIDER_SITE_OTHER): Payer: Medicare Other | Admitting: Physician Assistant

## 2014-09-25 ENCOUNTER — Encounter: Payer: Self-pay | Admitting: Physician Assistant

## 2014-09-25 ENCOUNTER — Ambulatory Visit (INDEPENDENT_AMBULATORY_CARE_PROVIDER_SITE_OTHER): Payer: Medicare Other

## 2014-09-25 VITALS — BP 150/60 | HR 64 | Ht 60.0 in | Wt 182.0 lb

## 2014-09-25 DIAGNOSIS — R42 Dizziness and giddiness: Secondary | ICD-10-CM | POA: Diagnosis not present

## 2014-09-25 DIAGNOSIS — N183 Chronic kidney disease, stage 3 unspecified: Secondary | ICD-10-CM | POA: Insufficient documentation

## 2014-09-25 DIAGNOSIS — D649 Anemia, unspecified: Secondary | ICD-10-CM | POA: Insufficient documentation

## 2014-09-25 DIAGNOSIS — R002 Palpitations: Secondary | ICD-10-CM

## 2014-09-25 DIAGNOSIS — I480 Paroxysmal atrial fibrillation: Secondary | ICD-10-CM | POA: Diagnosis not present

## 2014-09-25 DIAGNOSIS — I10 Essential (primary) hypertension: Secondary | ICD-10-CM

## 2014-09-25 DIAGNOSIS — N1832 Chronic kidney disease, stage 3b: Secondary | ICD-10-CM | POA: Insufficient documentation

## 2014-09-25 DIAGNOSIS — R001 Bradycardia, unspecified: Secondary | ICD-10-CM | POA: Insufficient documentation

## 2014-09-25 DIAGNOSIS — R61 Generalized hyperhidrosis: Secondary | ICD-10-CM | POA: Diagnosis not present

## 2014-09-25 NOTE — Patient Instructions (Signed)
Medication Instructions:  -No change  Labwork: Your physician recommends that you have lab work today: bmet/cbc w/diff/tsh   Testing/Procedures: -None  Follow-Up: Your physician recommends that you keep your scheduled  follow-up appointment on October 5th with Dr. Meda Coffee   Any Other Special Instructions Will Be Listed Below (If Applicable).

## 2014-09-25 NOTE — Progress Notes (Addendum)
Cardiology Office Note Date:  09/25/2014  Patient ID:  [name], DOB 1927/04/08, MRN [id number] PCP:  Antony Blackbird, MD  Cardiologist:  Dr. Meda Coffee  Chief Complaint: dizziness  History of Present Illness: [name] is a 81 y.o. female with history of remote PAF, mild pulm HTN, HLD, h/o bradycardia with beta blockers and diltiazem, fibromyalgia, anemia, GERD, CKD stage III who presents for follow-up of dizziness. She was a prior patient of Dr. Winnifred Friar and current patient of Dr. Meda Coffee. Per review of chart she has not been on anticoagulation due to h/o falls.  Per review of chart, she established with Dr. Meda Coffee in 02/2013.She had knee surgery in February. Admitted in 07/2013 with gastroenteritis and dehydration. With hydration, she developed volume excess and was placed on Lasix. Last seen by Dr. Meda Coffee 08/08/13 and edema was improved. Diuretic regimen was continued. 2D echo showed mild LVH, EF 55-60%, grade 1 DD, mild MR, severe LAE. She later had to stop Lasix due to dizziness. Stress test 09/13/13 was low risk, EF 69%, no reversible ischemia. She was seen by Dr. Meda Coffee last week (09/18/14) at which she was reporting episodes of palpitations, dizziness, and upset stomach followed by diaphoresis. She as seen by her PCP who prescribed empiric antibiotics. 48 hour holter and Meclizine were recommended. No recent labs available. Last labs 08/2013: Cr 1.34, K 5.2, Na 137, Hgb 11.2.  She comes in today for recheck accompanied by two family friends who are both nurses. She is having the Holter placed today. She reports continued dizziness but says it goes away with the meclizine. She says this has helped quite a bit. The dizziness has returned this afternoon - she says she is due for another dose meclizine. In clinic she is in NSR with stable (modestly elevated) BP. Sometimes she has associated nausea. The dizziness is particularly exacerbated by sudden head movements or looking in particular  directions. She continues to report occasional palpitations at night. Denies chest pain and dyspnea. No recent URIs. No change in gait, aphasia, facial asymmetry, thought disruption or headache.  Past Medical History  Diagnosis Date  . PAF (paroxysmal atrial fibrillation)   . Essential hypertension   . Personal history of other diseases of digestive system   . Other abnormal glucose   . Odynophagia   . Pneumonia     hx of  . Arthritis   . Gout   . Diverticulitis   . DVT (deep venous thrombosis)     from birth control, left groin  . Cataract   . Hypercholesteremia   . GERD (gastroesophageal reflux disease)   . Depression   . Asthma     no problems recent  . Skin cancer 2014    skin R elbow & face  . Anemia     "I stay anemic"  . Fibromyalgia 10 y.a.  . Hx of cardiovascular stress test     Lexiscan Myoview (8/15): apical attenuation artifact, no ischemia, EF 69% - low risk.  . Chronic diastolic CHF (congestive heart failure), NYHA class 2 03/19/2014  . Sinus bradycardia   . CKD (chronic kidney disease), stage III     Past Surgical History  Procedure Laterality Date  . Laryngoscopy / bronchoscopy / esophagoscopy  2010    Dr. Benjamine Mola  . Laminectomy  05/2007    foraminotomy, L4-5 laminectomy  . Cholecystectomy  10/2005    Dr. Effie Shy  . Colonoscopy w/ biopsies  2005, 2011    Dr. Bing Plume, "balloon  inside for last one at [facility]"  . Skin cancer excision Right ~2005    r elbow  . Tubal ligation    . Appendectomy  ~55years ago  . Abdominal hysterectomy      partial  . Tumor removal Right ~ 20 years ago    tumor in between toes  . Knee arthroscopy Right   . Total knee arthroplasty Left 04/04/2013    Procedure: LEFT TOTAL KNEE ARTHROPLASTY;  Surgeon: Tobi Bastos, MD;  Location: [facility];  Service: Orthopedics;  Laterality: Left;    Current Outpatient Prescriptions  Medication Sig Dispense Refill  . acetaminophen (TYLENOL) 325 MG tablet Take 325 mg by mouth every  6 (six) hours as needed for mild pain or headache.    . albuterol (PROVENTIL HFA;VENTOLIN HFA) 108 (90 BASE) MCG/ACT inhaler Inhale 1 puff into the lungs every 6 (six) hours as needed for wheezing or shortness of breath.    Marland Kitchen alendronate (FOSAMAX) 70 MG tablet Take 1 tablet by mouth once a week.    Marland Kitchen aspirin EC 81 MG tablet Take 81 mg by mouth daily.    . Cholecalciferol (VITAMIN D3) 10000 UNITS capsule Take 10,000 Units by mouth daily.    . Coenzyme Q10 (CO Q 10 PO) Take 200 mg by mouth daily. Pt states she does not take this everyday (09/18/13)    . Ferrous Sulfate 28 MG TABS Take 1 tablet by mouth daily.     Marland Kitchen glucosamine-chondroitin 500-400 MG tablet Take 1 tablet by mouth daily.    Marland Kitchen HYDROcodone-acetaminophen (NORCO/VICODIN) 5-325 MG per tablet Take 1 tablet by mouth daily as needed for moderate pain.    . meclizine (ANTIVERT) 25 MG tablet TAKE 1/2-1 WHOLE TABLET TWICE DAILY AS NEEDED FOR DIZZINESS 20 tablet 3  . montelukast (SINGULAIR) 10 MG tablet Take 10 mg by mouth at bedtime.      . Multiple Vitamins-Minerals (MULTIVITAMIN WITH MINERALS) tablet Take 1 tablet by mouth daily.    . Omega-3 Fatty Acids (FISH OIL BURP-LESS) 1200 MG CAPS Take 1 capsule by mouth daily.     . ondansetron (ZOFRAN) 4 MG tablet Take 4 mg by mouth every 8 (eight) hours as needed. For nausea    . pantoprazole (PROTONIX) 20 MG tablet Take 40 mg by mouth 2 (two) times daily.    . pravastatin (PRAVACHOL) 20 MG tablet Take 1 tablet by mouth daily.    . Probiotic Product (PROBIOTIC ADVANCED PO) Take 1 tablet by mouth daily.    . sertraline (ZOLOFT) 50 MG tablet Take 1 tablet by mouth every evening.     Marland Kitchen ULORIC 40 MG tablet Take 1 tablet by mouth daily.     No current facility-administered medications for this visit.    Allergies:   Amoxicillin; Diltiazem hcl; Sulfonamide derivatives; and Zetia   Social History:  The patient  reports that she quit smoking about 41 years ago. Her smoking use included Cigarettes. She  quit after 10 years of use. She has never used smokeless tobacco. She reports that she does not drink alcohol or use illicit drugs.   Family History:  The patient's family history includes Arrhythmia in her mother; Asthma in her sister; Cancer in her brother and sister; Diabetes in her father and sister; Fainting in her mother; Heart attack in her brother; Heart disease in her mother; Hypertension in her father and mother; Stroke in her father; Sudden death in her brother.  ROS:  Please see the history of present illness.  She reports occasional loose/soft stools and urge to defecate almost always after eating. No overt diarrhea. All other systems are reviewed and otherwise negative.   PHYSICAL EXAM:  VS:  BP 150/60 mmHg  Pulse 64  Ht 5' (1.524 m)  Wt 182 lb (82.555 kg)  BMI 35.54 kg/m2 BMI: Body mass index is 35.54 kg/(m^2). Well nourished, well developed elderly WF, in no acute distress HEENT: normocephalic, atraumatic Neck: no JVD, carotid bruits or masses Cardiac:  normal S1, S2; RRR; no murmurs, rubs, or gallops Lungs:  clear to auscultation bilaterally, no wheezing, rhonchi or rales Abd: soft, nontender, no hepatomegaly, + BS MS: no deformity or atrophy Ext: no edema Skin: warm and dry, no rash Neuro:  moves all extremities spontaneously, no focal abnormalities noted, follows commands, strength 5/5 UE LE bilaterally, mild horizontal nystagmus but no vertical nystagmus, no tremor, walks with cane for gait, normal facial symmetry, normal tone and cadence of speech Psych: euthymic mood, full affect   EKG:  Done today shows NSR 64bpm, LAFB, no acute ST-T changes  Recent Labs: No results found for requested labs within last 365 days.  No results found for requested labs within last 365 days.   CrCl cannot be calculated (Patient has no serum creatinine result on file.).   Wt Readings from Last 3 Encounters:  09/25/14 182 lb (82.555 kg)  09/18/14 180 lb (81.647 kg)  03/19/14 168 lb  (76.204 kg)     Other studies reviewed: Additional studies/records reviewed today include: summarized above  ASSESSMENT AND PLAN:  1. Recent palpitations/dizziness - event monitor was placed today per previous plan to evaluate palpitations. Dizziness sounds strongly suspicious for BPPV vs labrynthitis. Meclizine has given her relief. Would continue this for now and away event monitor result. If no significant arrhythmia would consider referral back to PCP or neuro to attempt Epley maneuver. She has no symptoms to suggest a central neurologic or acute process occurring today. Will check labs to exclude obvious causes of her dizziness. Her family friend, cardiac nurse Janene Harvey would like the lab results called to her - [phone] (the patient consented to this). 2. H/o PAF - event monitor as above. Not on anticoag due to h/o fall risk and gait instability. 3. Essential hypertension - BP mildly elevated in clinic. She reports SBP 130s-140s at home. So as not to complicate the picture of dizziness, I will not add any new meds today. I asked her to continue to follow BP at home and please call us if she notices a trend towards these higher pressures. 4. Anemia - f/u CBC today to ensure stability.  Disposition: F/u with Dr. Meda Coffee in about 2 months.  Current medicines are reviewed at length with the patient today.  The patient did not have any concerns regarding medicines.  Raechel Ache PA-C 09/25/2014 3:42 PM     [place] [address] [place] [address] [phone] (office)  [phone] (fax)

## 2014-09-26 LAB — CBC WITH DIFFERENTIAL/PLATELET
Basophils Absolute: 0 10*3/uL (ref 0.0–0.1)
Basophils Relative: 0.6 % (ref 0.0–3.0)
EOS ABS: 0.2 10*3/uL (ref 0.0–0.7)
EOS PCT: 3.8 % (ref 0.0–5.0)
HEMATOCRIT: 36 % (ref 36.0–46.0)
HEMOGLOBIN: 12.1 g/dL (ref 12.0–15.0)
LYMPHS PCT: 33.4 % (ref 12.0–46.0)
Lymphs Abs: 2.2 10*3/uL (ref 0.7–4.0)
MCHC: 33.5 g/dL (ref 30.0–36.0)
MCV: 94 fl (ref 78.0–100.0)
MONO ABS: 0.2 10*3/uL (ref 0.1–1.0)
Monocytes Relative: 3.7 % (ref 3.0–12.0)
Neutro Abs: 3.8 10*3/uL (ref 1.4–7.7)
Neutrophils Relative %: 58.5 % (ref 43.0–77.0)
Platelets: 168 10*3/uL (ref 150.0–400.0)
RBC: 3.83 Mil/uL — AB (ref 3.87–5.11)
RDW: 13.4 % (ref 11.5–15.5)
WBC: 6.5 10*3/uL (ref 4.0–10.5)

## 2014-09-26 LAB — BASIC METABOLIC PANEL
BUN: 28 mg/dL — AB (ref 6–23)
CO2: 27 mEq/L (ref 19–32)
Calcium: 9.7 mg/dL (ref 8.4–10.5)
Chloride: 106 mEq/L (ref 96–112)
Creatinine, Ser: 1.23 mg/dL — ABNORMAL HIGH (ref 0.40–1.20)
GFR: 44.08 mL/min — AB (ref >60.00)
Glucose, Bld: 103 mg/dL — ABNORMAL HIGH (ref 70–99)
POTASSIUM: 4.4 meq/L (ref 3.5–5.1)
SODIUM: 141 meq/L (ref 135–145)

## 2014-09-26 LAB — TSH: TSH: 0.81 u[IU]/mL (ref 0.35–4.50)

## 2014-09-27 ENCOUNTER — Ambulatory Visit
Admission: RE | Admit: 2014-09-27 | Discharge: 2014-09-27 | Disposition: A | Discharge status: Home / Self Care | Payer: Medicare Other | Source: Ambulatory Visit | Attending: Family Medicine | Admitting: Family Medicine

## 2014-09-27 DIAGNOSIS — I6523 Occlusion and stenosis of bilateral carotid arteries: Secondary | ICD-10-CM | POA: Diagnosis not present

## 2014-09-27 DIAGNOSIS — R42 Dizziness and giddiness: Secondary | ICD-10-CM

## 2014-10-10 DIAGNOSIS — Z96652 Presence of left artificial knee joint: Secondary | ICD-10-CM | POA: Diagnosis not present

## 2014-10-10 DIAGNOSIS — M25561 Pain in right knee: Secondary | ICD-10-CM | POA: Diagnosis not present

## 2014-10-10 DIAGNOSIS — Z471 Aftercare following joint replacement surgery: Secondary | ICD-10-CM | POA: Diagnosis not present

## 2014-10-10 DIAGNOSIS — M542 Cervicalgia: Secondary | ICD-10-CM | POA: Diagnosis not present

## 2014-11-13 ENCOUNTER — Ambulatory Visit (INDEPENDENT_AMBULATORY_CARE_PROVIDER_SITE_OTHER): Payer: Medicare Other | Admitting: Cardiology

## 2014-11-13 ENCOUNTER — Encounter: Payer: Self-pay | Admitting: Cardiology

## 2014-11-13 VITALS — BP 124/72 | HR 64 | Ht 63.0 in | Wt 183.6 lb

## 2014-11-13 DIAGNOSIS — I10 Essential (primary) hypertension: Secondary | ICD-10-CM

## 2014-11-13 DIAGNOSIS — R42 Dizziness and giddiness: Secondary | ICD-10-CM

## 2014-11-13 DIAGNOSIS — I480 Paroxysmal atrial fibrillation: Secondary | ICD-10-CM | POA: Diagnosis not present

## 2014-11-13 DIAGNOSIS — R600 Localized edema: Secondary | ICD-10-CM | POA: Diagnosis not present

## 2014-11-13 MED ORDER — ALBUTEROL SULFATE HFA 108 (90 BASE) MCG/ACT IN AERS: 1.0000 | INHALATION_SPRAY | Freq: Four times a day (QID) | RESPIRATORY_TRACT | Status: DC | PRN

## 2014-11-13 MED ORDER — FUROSEMIDE 20 MG PO TABS: 20.0000 mg | ORAL_TABLET | Freq: Every day | ORAL | Status: DC | PRN

## 2014-11-13 NOTE — Progress Notes (Signed)
Patient ID: CHASSY NORED, female   DOB: July 27, 1927, 81 y.o.   MRN: [id number]    Cardiology Office Note Date:  11/13/2014  Patient ID:  [name], DOB 1927/07/14, MRN [id number] PCP:  Antony Blackbird, MD  Cardiologist:  Dr. Meda Coffee  Chief Complaint: dizziness  History of Present Illness: [name] is a 81 y.o. female with history of remote PAF, mild pulm HTN, HLD, h/o bradycardia with beta blockers and diltiazem, fibromyalgia, anemia, GERD, CKD stage III who presents for follow-up of dizziness. She was a prior patient of Dr. Winnifred Friar and current patient of Dr. Meda Coffee. Per review of chart she has not been on anticoagulation due to h/o falls.  Per review of chart, she established with Dr. Meda Coffee in 02/2013.She had knee surgery in February. Admitted in 07/2013 with gastroenteritis and dehydration. With hydration, she developed volume excess and was placed on Lasix. Last seen by Dr. Meda Coffee 08/08/13 and edema was improved. Diuretic regimen was continued. 2D echo showed mild LVH, EF 55-60%, grade 1 DD, mild MR, severe LAE. She later had to stop Lasix due to dizziness. Stress test 09/13/13 was low risk, EF 69%, no reversible ischemia. She was seen by Dr. Meda Coffee last week (09/18/14) at which she was reporting episodes of palpitations, dizziness, and upset stomach followed by diaphoresis. She as seen by her PCP who prescribed empiric antibiotics. 48 hour holter and Meclizine were recommended. No recent labs available. Last labs 08/2013: Cr 1.34, K 5.2, Na 137, Hgb 11.2.  She comes in today for recheck accompanied by two family friends who are both nurses. She is having the Holter placed today. She reports continued dizziness but says it goes away with the meclizine. She says this has helped quite a bit. The dizziness has returned this afternoon - she says she is due for another dose meclizine. In clinic she is in NSR with stable (modestly elevated) BP. Sometimes she has associated nausea. The dizziness is  particularly exacerbated by sudden head movements or looking in particular directions. She continues to report occasional palpitations at night. Denies chest pain and dyspnea. No recent URIs. No change in gait, aphasia, facial asymmetry, thought disruption or headache.  11/13/14 - 6 weeks follow up, improved dizziness with Meclizine, no falls, no DOE, CP. Complains of chest congestion that improves with albuterol inhalor and LE edema that comes and goes.  Past Medical History  Diagnosis Date  . PAF (paroxysmal atrial fibrillation)   . Essential hypertension   . Personal history of other diseases of digestive system   . Other abnormal glucose   . Odynophagia   . Pneumonia     hx of  . Arthritis   . Gout   . Diverticulitis   . DVT (deep venous thrombosis)     from birth control, left groin  . Cataract   . Hypercholesteremia   . GERD (gastroesophageal reflux disease)   . Depression   . Asthma     no problems recent  . Skin cancer 2014    skin R elbow & face  . Anemia     "I stay anemic"  . Fibromyalgia 10 y.a.  . Hx of cardiovascular stress test     Lexiscan Myoview (8/15): apical attenuation artifact, no ischemia, EF 69% - low risk.  . Chronic diastolic CHF (congestive heart failure), NYHA class 2 03/19/2014  . Sinus bradycardia   . CKD (chronic kidney disease), stage III    Past Surgical History  Procedure Laterality Date  .  Laryngoscopy / bronchoscopy / esophagoscopy  2010    Dr. Benjamine Mola  . Laminectomy  05/2007    foraminotomy, L4-5 laminectomy  . Cholecystectomy  10/2005    Dr. Effie Shy  . Colonoscopy w/ biopsies  2005, 2011    Dr. Bing Plume, "balloon inside for last one at [facility]"  . Skin cancer excision Right ~2005    r elbow  . Tubal ligation    . Appendectomy  ~55years ago  . Abdominal hysterectomy      partial  . Tumor removal Right ~ 20 years ago    tumor in between toes  . Knee arthroscopy Right   . Total knee arthroplasty Left 04/04/2013    Procedure:  LEFT TOTAL KNEE ARTHROPLASTY;  Surgeon: Tobi Bastos, MD;  Location: [facility];  Service: Orthopedics;  Laterality: Left;    Current Outpatient Prescriptions  Medication Sig Dispense Refill  . acetaminophen (TYLENOL) 325 MG tablet Take 325 mg by mouth every 6 (six) hours as needed for mild pain or headache.    . albuterol (PROVENTIL HFA;VENTOLIN HFA) 108 (90 BASE) MCG/ACT inhaler Inhale 1 puff into the lungs every 6 (six) hours as needed for wheezing or shortness of breath.    Marland Kitchen alendronate (FOSAMAX) 70 MG tablet Take 1 tablet by mouth once a week.    Marland Kitchen aspirin EC 81 MG tablet Take 81 mg by mouth daily.    . Cholecalciferol (VITAMIN D3) 10000 UNITS capsule Take 10,000 Units by mouth daily.    . Coenzyme Q10 (CO Q 10 PO) Take 200 mg by mouth daily. Pt states she does not take this everyday (09/18/13)    . Ferrous Sulfate 28 MG TABS Take 1 tablet by mouth daily.     Marland Kitchen glucosamine-chondroitin 500-400 MG tablet Take 1 tablet by mouth daily.    Marland Kitchen HYDROcodone-acetaminophen (NORCO/VICODIN) 5-325 MG per tablet Take 1 tablet by mouth daily as needed for moderate pain.    . meclizine (ANTIVERT) 25 MG tablet TAKE 1/2-1 WHOLE TABLET TWICE DAILY AS NEEDED FOR DIZZINESS 20 tablet 3  . montelukast (SINGULAIR) 10 MG tablet Take 10 mg by mouth at bedtime.      . Multiple Vitamins-Minerals (MULTIVITAMIN WITH MINERALS) tablet Take 1 tablet by mouth daily.    . Omega-3 Fatty Acids (FISH OIL BURP-LESS) 1200 MG CAPS Take 1 capsule by mouth daily.     . ondansetron (ZOFRAN) 4 MG tablet Take 4 mg by mouth every 8 (eight) hours as needed. For nausea    . pantoprazole (PROTONIX) 20 MG tablet Take 40 mg by mouth 2 (two) times daily.    . pravastatin (PRAVACHOL) 20 MG tablet Take 1 tablet by mouth daily.    . Probiotic Product (PROBIOTIC ADVANCED PO) Take 1 tablet by mouth daily.    . sertraline (ZOLOFT) 50 MG tablet Take 1 tablet by mouth every evening.     Marland Kitchen ULORIC 40 MG tablet Take 1 tablet by mouth daily.     No  current facility-administered medications for this visit.    Allergies:   Amoxicillin; Diltiazem hcl; Sulfonamide derivatives; and Zetia   Social History:  The patient  reports that she quit smoking about 41 years ago. Her smoking use included Cigarettes. She quit after 10 years of use. She has never used smokeless tobacco. She reports that she does not drink alcohol or use illicit drugs.   Family History:  The patient's family history includes Arrhythmia in her mother; Asthma in her sister; Cancer in her  brother and sister; Diabetes in her father and sister; Fainting in her mother; Heart attack in her brother; Heart disease in her mother; Hypertension in her father and mother; Stroke in her father; Sudden death in her brother.  ROS:  Please see the history of present illness. She reports occasional loose/soft stools and urge to defecate almost always after eating. No overt diarrhea. All other systems are reviewed and otherwise negative.   PHYSICAL EXAM:  VS:  There were no vitals taken for this visit. BMI: There is no weight on file to calculate BMI. Well nourished, well developed elderly WF, in no acute distress HEENT: normocephalic, atraumatic Neck: no JVD, carotid bruits or masses Cardiac:  normal S1, S2; RRR; no murmurs, rubs, or gallops Lungs:  clear to auscultation bilaterally, no wheezing, rhonchi or rales Abd: soft, nontender, no hepatomegaly, + BS MS: no deformity or atrophy Ext: no edema Skin: warm and dry, no rash Neuro:  moves all extremities spontaneously, no focal abnormalities noted, follows commands, strength 5/5 UE LE bilaterally, mild horizontal nystagmus but no vertical nystagmus, no tremor, walks with cane for gait, normal facial symmetry, normal tone and cadence of speech Psych: euthymic mood, full affect  EKG:  Done today shows NSR 64bpm, LAFB, no acute ST-T changes  Recent Labs: 09/25/2014: BUN 28*; Creatinine, Ser 1.23*; Hemoglobin 12.1; Platelets 168.0; Potassium  4.4; Sodium 141; TSH 0.81  No results found for requested labs within last 365 days.   CrCl cannot be calculated (Unknown ideal weight.).   Wt Readings from Last 3 Encounters:  09/25/14 182 lb (82.555 kg)  09/18/14 180 lb (81.647 kg)  03/19/14 168 lb (76.204 kg)   Holter monitor:  Sinus bradycardia to sinus tachycardia, profound sinus arrhythmia and increased vagal tone with episodes of sinus bradycardia dwon to 43 BPM but no pause longer then 2 seconds.  Very short episodes of atrial tachycardia.                                 No significant pauses found, increased vagal tone, the patient should stay well hydrated.     ASSESSMENT AND PLAN:  1. Dizziness - event monitor showed sinus arrhythmias, sinus bradycardia with no pause longer than 2 seconds. Improved with meclizine. Her family friend, cardiac nurse Janene Harvey would like the lab results called to her - [phone] (the patient consented to this).  2. H/o PAF - event monitor as above. Not on anticoag due to h/o fall risk and gait instability.  3. LE edema - start lasix 20 mg po QD PRN  4. Essential hypertension - controlled  Disposition: F/u with Dr. Meda Coffee in 6 months.  [name] 11/13/2014 10:45 AM

## 2014-11-13 NOTE — Patient Instructions (Signed)
Medication Instructions:   WE REFILLED YOUR ALBUTEROL INHALER TO USE AS NEEDED FOR CONGESTION  DR NELSON HAS PRESCRIBED YOU LASIX 20 MG BY MOUTH DAILY AS NEEDED FOR LOWER EXTREMITY EDEMA        Follow-Up:  Your physician wants you to follow-up in: [place] will receive a reminder letter in the mail two months in advance. If you don't receive a letter, please call our office to schedule the follow-up appointment.

## 2014-11-17 DIAGNOSIS — M545 Low back pain: Secondary | ICD-10-CM | POA: Diagnosis not present

## 2014-11-17 DIAGNOSIS — M25552 Pain in left hip: Secondary | ICD-10-CM | POA: Diagnosis not present

## 2014-11-17 DIAGNOSIS — R1032 Left lower quadrant pain: Secondary | ICD-10-CM | POA: Diagnosis not present

## 2014-11-21 DIAGNOSIS — M545 Low back pain: Secondary | ICD-10-CM | POA: Diagnosis not present

## 2014-12-03 DIAGNOSIS — Z23 Encounter for immunization: Secondary | ICD-10-CM | POA: Diagnosis not present

## 2015-02-04 DIAGNOSIS — H04123 Dry eye syndrome of bilateral lacrimal glands: Secondary | ICD-10-CM | POA: Diagnosis not present

## 2015-02-06 ENCOUNTER — Other Ambulatory Visit: Payer: Self-pay

## 2015-02-06 DIAGNOSIS — Z1231 Encounter for screening mammogram for malignant neoplasm of breast: Secondary | ICD-10-CM

## 2015-02-12 DIAGNOSIS — D649 Anemia, unspecified: Secondary | ICD-10-CM | POA: Diagnosis not present

## 2015-02-12 DIAGNOSIS — E782 Mixed hyperlipidemia: Secondary | ICD-10-CM | POA: Diagnosis not present

## 2015-02-12 DIAGNOSIS — J309 Allergic rhinitis, unspecified: Secondary | ICD-10-CM | POA: Diagnosis not present

## 2015-02-12 DIAGNOSIS — I10 Essential (primary) hypertension: Secondary | ICD-10-CM | POA: Diagnosis not present

## 2015-02-12 DIAGNOSIS — K219 Gastro-esophageal reflux disease without esophagitis: Secondary | ICD-10-CM | POA: Diagnosis not present

## 2015-02-12 DIAGNOSIS — M109 Gout, unspecified: Secondary | ICD-10-CM | POA: Diagnosis not present

## 2015-02-12 DIAGNOSIS — I129 Hypertensive chronic kidney disease with stage 1 through stage 4 chronic kidney disease, or unspecified chronic kidney disease: Secondary | ICD-10-CM | POA: Diagnosis not present

## 2015-02-12 DIAGNOSIS — F411 Generalized anxiety disorder: Secondary | ICD-10-CM | POA: Diagnosis not present

## 2015-02-20 ENCOUNTER — Ambulatory Visit
Admission: RE | Admit: 2015-02-20 | Discharge: 2015-02-20 | Disposition: A | Discharge status: Home / Self Care | Payer: Medicare Other | Source: Ambulatory Visit

## 2015-02-20 DIAGNOSIS — Z1231 Encounter for screening mammogram for malignant neoplasm of breast: Secondary | ICD-10-CM | POA: Diagnosis not present

## 2015-02-26 ENCOUNTER — Other Ambulatory Visit: Payer: Self-pay | Admitting: Family

## 2015-02-26 DIAGNOSIS — M8580 Other specified disorders of bone density and structure, unspecified site: Secondary | ICD-10-CM

## 2015-03-07 DIAGNOSIS — I10 Essential (primary) hypertension: Secondary | ICD-10-CM | POA: Diagnosis not present

## 2015-03-07 DIAGNOSIS — Z79899 Other long term (current) drug therapy: Secondary | ICD-10-CM | POA: Diagnosis not present

## 2015-03-07 DIAGNOSIS — E782 Mixed hyperlipidemia: Secondary | ICD-10-CM | POA: Diagnosis not present

## 2015-03-07 DIAGNOSIS — M109 Gout, unspecified: Secondary | ICD-10-CM | POA: Diagnosis not present

## 2015-03-07 DIAGNOSIS — D649 Anemia, unspecified: Secondary | ICD-10-CM | POA: Diagnosis not present

## 2015-03-24 ENCOUNTER — Other Ambulatory Visit: Payer: Medicare Other

## 2015-03-26 DIAGNOSIS — H903 Sensorineural hearing loss, bilateral: Secondary | ICD-10-CM | POA: Diagnosis not present

## 2015-04-01 ENCOUNTER — Ambulatory Visit
Admission: RE | Admit: 2015-04-01 | Discharge: 2015-04-01 | Disposition: A | Discharge status: Home / Self Care | Payer: Medicare Other | Source: Ambulatory Visit | Attending: Family | Admitting: Family

## 2015-04-01 DIAGNOSIS — M8580 Other specified disorders of bone density and structure, unspecified site: Secondary | ICD-10-CM

## 2015-04-01 DIAGNOSIS — M810 Age-related osteoporosis without current pathological fracture: Secondary | ICD-10-CM | POA: Diagnosis not present

## 2015-04-07 DIAGNOSIS — L820 Inflamed seborrheic keratosis: Secondary | ICD-10-CM | POA: Diagnosis not present

## 2015-04-07 DIAGNOSIS — Z85828 Personal history of other malignant neoplasm of skin: Secondary | ICD-10-CM | POA: Diagnosis not present

## 2015-05-12 DIAGNOSIS — J019 Acute sinusitis, unspecified: Secondary | ICD-10-CM | POA: Diagnosis not present

## 2015-05-12 DIAGNOSIS — K5900 Constipation, unspecified: Secondary | ICD-10-CM | POA: Diagnosis not present

## 2015-05-27 ENCOUNTER — Ambulatory Visit (INDEPENDENT_AMBULATORY_CARE_PROVIDER_SITE_OTHER): Payer: Medicare Other | Admitting: Cardiology

## 2015-05-27 ENCOUNTER — Encounter: Payer: Self-pay | Admitting: Cardiology

## 2015-05-27 VITALS — BP 122/70 | HR 57 | Ht 63.0 in | Wt 182.0 lb

## 2015-05-27 DIAGNOSIS — R072 Precordial pain: Secondary | ICD-10-CM | POA: Diagnosis not present

## 2015-05-27 DIAGNOSIS — I480 Paroxysmal atrial fibrillation: Secondary | ICD-10-CM | POA: Diagnosis not present

## 2015-05-27 DIAGNOSIS — R42 Dizziness and giddiness: Secondary | ICD-10-CM | POA: Diagnosis not present

## 2015-05-27 DIAGNOSIS — I5032 Chronic diastolic (congestive) heart failure: Secondary | ICD-10-CM | POA: Diagnosis not present

## 2015-05-27 NOTE — Patient Instructions (Signed)
Your physician recommends that you continue on your current medications as directed. Please refer to the Current Medication list given to you today.     Your physician wants you to follow-up in: 6 MONTHS WITH DR NELSON You will receive a reminder letter in the mail two months in advance. If you don't receive a letter, please call our office to schedule the follow-up appointment.  

## 2015-05-27 NOTE — Progress Notes (Signed)
Patient ID: [name], female   DOB: [birth year], 82 y.o.   MRN: [id number]     Cardiology Office Note Date:  05/27/2015  Patient ID:  [name], DOB 01/17/1928, MRN [id number] PCP:  Antony Blackbird, MD  Cardiologist:  Dr. Meda Coffee  Chief Complaint: n.  History of Present Illness: ESSENCE HEACOCK is a 82 y.o. female with history of remote PAF, mild pulm HTN, HLD, h/o bradycardia with beta blockers and diltiazem, fibromyalgia, anemia, GERD, CKD stage III who presents for follow-up of dizziness. She was a prior patient of Dr. Winnifred Friar and current patient of Dr. Meda Coffee. Per review of chart she has not been on anticoagulation due to h/o falls.  Per review of chart, she established with Dr. Meda Coffee in 02/2013.She had knee surgery in February. Admitted in 07/2013 with gastroenteritis and dehydration. With hydration, she developed volume excess and was placed on Lasix. Last seen by Dr. Meda Coffee 08/08/13 and edema was improved. Diuretic regimen was continued. 2D echo showed mild LVH, EF 55-60%, grade 1 DD, mild MR, severe LAE. She later had to stop Lasix due to dizziness. Stress test 09/13/13 was low risk, EF 69%, no reversible ischemia. She was seen by Dr. Meda Coffee last week (09/18/14) at which she was reporting episodes of palpitations, dizziness, and upset stomach followed by diaphoresis. She as seen by her PCP who prescribed empiric antibiotics. 48 hour holter and Meclizine were recommended. No recent labs available. Last labs 08/2013: Cr 1.34, K 5.2, Na 137, Hgb 11.2.  She comes in today for recheck accompanied by two family friends who are both nurses. She is having the Holter placed today. She reports continued dizziness but says it goes away with the meclizine. She says this has helped quite a bit. The dizziness has returned this afternoon - she says she is due for another dose meclizine. In clinic she is in NSR with stable (modestly elevated) BP. Sometimes she has associated nausea. The dizziness is  particularly exacerbated by sudden head movements or looking in particular directions. She continues to report occasional palpitations at night. Denies chest pain and dyspnea. No recent URIs. No change in gait, aphasia, facial asymmetry, thought disruption or headache.  05/27/2015 - 6 months follow-up, patient denies dizziness, she fell couple weeks ago as she tripped in a kitchen. No syncope. No palpitations. She has been experiencing chest pain after certain foods. Last week she experienced chest pain with radiation to her arms after she ate chocolate take before going to bed. She hasn't noticed any chest pain or shortness of breath when she is exerting herself. She is active every day she is able to clean her house doing laundry and he states she is completely independent she just has to take some breaks in between work. She has been taking Protonix with some improvement.  Past Medical History  Diagnosis Date  . PAF (paroxysmal atrial fibrillation) ([place])   . Essential hypertension   . Personal history of other diseases of digestive system   . Other abnormal glucose   . Odynophagia   . Pneumonia     hx of  . Arthritis   . Gout   . Diverticulitis   . DVT (deep venous thrombosis) ([place])     from birth control, left groin  . Cataract   . Hypercholesteremia   . GERD (gastroesophageal reflux disease)   . Depression   . Asthma     no problems recent  . Skin cancer 2014    skin R  elbow & face  . Anemia     "I stay anemic"  . Fibromyalgia 10 y.a.  . Hx of cardiovascular stress test     Lexiscan Myoview (8/15): apical attenuation artifact, no ischemia, EF 69% - low risk.  . Chronic diastolic CHF (congestive heart failure), NYHA class 2 ([place]) 03/19/2014  . Sinus bradycardia   . CKD (chronic kidney disease), stage III    Past Surgical History  Procedure Laterality Date  . Laryngoscopy / bronchoscopy / esophagoscopy  2010    Dr. Benjamine Mola  . Laminectomy  05/2007    foraminotomy, L4-5 laminectomy   . Cholecystectomy  10/2005    Dr. Effie Shy  . Colonoscopy w/ biopsies  2005, 2011    Dr. Bing Plume, "balloon inside for last one at [facility]"  . Skin cancer excision Right ~2005    r elbow  . Tubal ligation    . Appendectomy  ~55years ago  . Abdominal hysterectomy      partial  . Tumor removal Right ~ 20 years ago    tumor in between toes  . Knee arthroscopy Right   . Total knee arthroplasty Left 04/04/2013    Procedure: LEFT TOTAL KNEE ARTHROPLASTY;  Surgeon: Tobi Bastos, MD;  Location: [facility];  Service: Orthopedics;  Laterality: Left;    Current Outpatient Prescriptions  Medication Sig Dispense Refill  . albuterol (PROVENTIL HFA;VENTOLIN HFA) 108 (90 BASE) MCG/ACT inhaler Inhale 1 puff into the lungs every 6 (six) hours as needed for wheezing or shortness of breath. 1 Inhaler 3  . alendronate (FOSAMAX) 70 MG tablet Take 1 tablet by mouth once a week.    Marland Kitchen aspirin EC 81 MG tablet Take 81 mg by mouth daily.    . Cholecalciferol (VITAMIN D3) 10000 UNITS capsule Take 10,000 Units by mouth daily.    . Ferrous Sulfate 28 MG TABS Take 1 tablet by mouth daily.     . furosemide (LASIX) 20 MG tablet Take 1 tablet (20 mg total) by mouth daily as needed for edema. 90 tablet 3  . glucosamine-chondroitin 500-400 MG tablet Take 1 tablet by mouth daily.    Marland Kitchen HYDROcodone-acetaminophen (NORCO/VICODIN) 5-325 MG per tablet Take 1 tablet by mouth daily as needed for moderate pain (leg pain).     . montelukast (SINGULAIR) 10 MG tablet Take 10 mg by mouth at bedtime.      . Multiple Vitamins-Minerals (MULTIVITAMIN WITH MINERALS) tablet Take 1 tablet by mouth daily.    . Omega-3 Fatty Acids (FISH OIL BURP-LESS) 1200 MG CAPS Take 1 capsule by mouth daily.     . ondansetron (ZOFRAN) 4 MG tablet Take 4 mg by mouth every 8 (eight) hours as needed. For nausea    . pantoprazole (PROTONIX) 20 MG tablet Take 40 mg by mouth 2 (two) times daily.    . pravastatin (PRAVACHOL) 20 MG tablet Take 1 tablet by  mouth daily.    . Probiotic Product (PROBIOTIC ADVANCED PO) Take 1 tablet by mouth daily.    . sertraline (ZOLOFT) 50 MG tablet Take 1 tablet by mouth every evening.     Marland Kitchen ULORIC 40 MG tablet Take 1 tablet by mouth daily.     No current facility-administered medications for this visit.    Allergies:   Amoxicillin; Diltiazem hcl; Sulfonamide derivatives; and Zetia   Social History:  The patient  reports that she quit smoking about 42 years ago. Her smoking use included Cigarettes. She quit after 10 years of use. She has  never used smokeless tobacco. She reports that she does not drink alcohol or use illicit drugs.   Family History:  The patient's family history includes Arrhythmia in her mother; Asthma in her sister; Cancer in her brother and sister; Diabetes in her father and sister; Fainting in her mother; Heart attack in her brother; Heart disease in her mother; Hypertension in her father and mother; Stroke in her father; Sudden death in her brother.  ROS:  Please see the history of present illness. She reports occasional loose/soft stools and urge to defecate almost always after eating. No overt diarrhea. All other systems are reviewed and otherwise negative.   PHYSICAL EXAM:  VS:  BP 122/70 mmHg  Pulse 57  Ht 5\' 3"  (1.6 m)  Wt 182 lb (82.555 kg)  BMI 32.25 kg/m2 BMI: Body mass index is 32.25 kg/(m^2). Well nourished, well developed elderly WF, in no acute distress HEENT: normocephalic, atraumatic Neck: no JVD, carotid bruits or masses Cardiac:  normal S1, S2; RRR; no murmurs, rubs, or gallops Lungs:  clear to auscultation bilaterally, no wheezing, rhonchi or rales Abd: soft, nontender, no hepatomegaly, + BS MS: no deformity or atrophy Ext: no edema Skin: warm and dry, no rash Neuro:  moves all extremities spontaneously, no focal abnormalities noted, follows commands, strength 5/5 UE LE bilaterally, mild horizontal nystagmus but no vertical nystagmus, no tremor, walks with cane for  gait, normal facial symmetry, normal tone and cadence of speech Psych: euthymic mood, full affect  EKG:  Done today shows NSR 64bpm, LAFB, no acute ST-T changes  Recent Labs: 09/25/2014: BUN 28*; Creatinine, Ser 1.23*; Hemoglobin 12.1; Platelets 168.0; Potassium 4.4; Sodium 141; TSH 0.81  No results found for requested labs within last 365 days.   CrCl cannot be calculated (Patient has no serum creatinine result on file.).   Wt Readings from Last 3 Encounters:  05/27/15 182 lb (82.555 kg)  11/13/14 183 lb 9.6 oz (83.28 kg)  09/25/14 182 lb (82.555 kg)   Holter monitor:  Sinus bradycardia to sinus tachycardia, profound sinus arrhythmia and increased vagal tone with episodes of sinus bradycardia dwon to 43 BPM but no pause longer then 2 seconds.  Very short episodes of atrial tachycardia.                                 No significant pauses found, increased vagal tone, the patient should stay well hydrated.   EKG done today 05/27/2015 shows sinus bradycardia left anterior fascicular block that is unchanged from prior.  ASSESSMENT AND PLAN:  1.Chest pain - sounds atypical and more like reflux disease. She is advised to continue taking Protonix and avoid spicy food or chocolate prior to going to bed. She is also advised to elevate her head with pillows in bed. She had completely normal stress test 2 years ago I will follow her and if her symptoms worsen or don't improve might consider to repeat but I would rather avoid considering atypical nature of pain in her age. She remains very active and has no symptoms during exertion.  2.  Dizziness - event monitor showed sinus arrhythmias, sinus bradycardia with no pause longer than 2 seconds. Improved with meclizine. No recent fall except for one mechanical. She is advised to call us if she develops syncope or presyncope. Her EKG today shows sinus bradycardia and left anterior fascicular block that is unchanged from prior last year.  3. H/o  PAF -  event monitor as above. Not on anticoag due to h/o fall risk and gait instability.  4.  Chronic diastolic CHF, LE edema - continue lasix 20 mg po QD PRN, stable  Disposition: F/u with Dr. Meda Coffee in 6 months.  Corliss Blacker 05/27/2015 12:20 PM

## 2015-06-28 ENCOUNTER — Emergency Department (HOSPITAL_COMMUNITY): Payer: Medicare Other

## 2015-06-28 ENCOUNTER — Emergency Department (HOSPITAL_COMMUNITY)
Admission: EM | Admit: 2015-06-28 | Discharge: 2015-06-28 | Disposition: A | Discharge status: Home / Self Care | Payer: Medicare Other | Attending: Emergency Medicine | Admitting: Emergency Medicine

## 2015-06-28 ENCOUNTER — Encounter (HOSPITAL_COMMUNITY): Payer: Self-pay | Admitting: Emergency Medicine

## 2015-06-28 DIAGNOSIS — J45901 Unspecified asthma with (acute) exacerbation: Secondary | ICD-10-CM

## 2015-06-28 DIAGNOSIS — Z79899 Other long term (current) drug therapy: Secondary | ICD-10-CM | POA: Diagnosis not present

## 2015-06-28 DIAGNOSIS — M1990 Unspecified osteoarthritis, unspecified site: Secondary | ICD-10-CM | POA: Diagnosis not present

## 2015-06-28 DIAGNOSIS — F329 Major depressive disorder, single episode, unspecified: Secondary | ICD-10-CM | POA: Insufficient documentation

## 2015-06-28 DIAGNOSIS — Z87891 Personal history of nicotine dependence: Secondary | ICD-10-CM | POA: Insufficient documentation

## 2015-06-28 DIAGNOSIS — I129 Hypertensive chronic kidney disease with stage 1 through stage 4 chronic kidney disease, or unspecified chronic kidney disease: Secondary | ICD-10-CM | POA: Diagnosis not present

## 2015-06-28 DIAGNOSIS — E7800 Pure hypercholesterolemia, unspecified: Secondary | ICD-10-CM | POA: Diagnosis not present

## 2015-06-28 DIAGNOSIS — N183 Chronic kidney disease, stage 3 (moderate): Secondary | ICD-10-CM | POA: Insufficient documentation

## 2015-06-28 DIAGNOSIS — Z7982 Long term (current) use of aspirin: Secondary | ICD-10-CM | POA: Diagnosis not present

## 2015-06-28 DIAGNOSIS — B9789 Other viral agents as the cause of diseases classified elsewhere: Secondary | ICD-10-CM

## 2015-06-28 DIAGNOSIS — R05 Cough: Secondary | ICD-10-CM | POA: Diagnosis not present

## 2015-06-28 DIAGNOSIS — R0602 Shortness of breath: Secondary | ICD-10-CM | POA: Diagnosis not present

## 2015-06-28 DIAGNOSIS — J069 Acute upper respiratory infection, unspecified: Secondary | ICD-10-CM | POA: Insufficient documentation

## 2015-06-28 DIAGNOSIS — Z96652 Presence of left artificial knee joint: Secondary | ICD-10-CM | POA: Insufficient documentation

## 2015-06-28 DIAGNOSIS — B349 Viral infection, unspecified: Secondary | ICD-10-CM | POA: Diagnosis not present

## 2015-06-28 DIAGNOSIS — J988 Other specified respiratory disorders: Secondary | ICD-10-CM

## 2015-06-28 LAB — CBC WITH DIFFERENTIAL/PLATELET
Basophils Absolute: 0 10*3/uL (ref 0.0–0.1)
Basophils Relative: 0 %
Eosinophils Absolute: 0.2 10*3/uL (ref 0.0–0.7)
Eosinophils Relative: 2 %
HEMATOCRIT: 35.1 % — AB (ref 36.0–46.0)
HEMOGLOBIN: 11.7 g/dL — AB (ref 12.0–15.0)
LYMPHS ABS: 2 10*3/uL (ref 0.7–4.0)
Lymphocytes Relative: 25 %
MCH: 32.1 pg (ref 26.0–34.0)
MCHC: 33.3 g/dL (ref 30.0–36.0)
MCV: 96.2 fL (ref 78.0–100.0)
MONOS PCT: 7 %
Monocytes Absolute: 0.6 10*3/uL (ref 0.1–1.0)
NEUTROS ABS: 5.3 10*3/uL (ref 1.7–7.7)
NEUTROS PCT: 66 %
Platelets: 135 10*3/uL — ABNORMAL LOW (ref 150–400)
RBC: 3.65 MIL/uL — ABNORMAL LOW (ref 3.87–5.11)
RDW: 13.5 % (ref 11.5–15.5)
WBC: 8.1 10*3/uL (ref 4.0–10.5)

## 2015-06-28 LAB — BASIC METABOLIC PANEL
Anion gap: 11 (ref 5–15)
BUN: 23 mg/dL — AB (ref 6–20)
CHLORIDE: 104 mmol/L (ref 101–111)
CO2: 26 mmol/L (ref 22–32)
CREATININE: 1.31 mg/dL — AB (ref 0.44–1.00)
Calcium: 9.1 mg/dL (ref 8.9–10.3)
GFR, EST AFRICAN AMERICAN: 42 mL/min — AB (ref >60)
GFR, EST NON AFRICAN AMERICAN: 36 mL/min — AB (ref >60)
Glucose, Bld: 138 mg/dL — ABNORMAL HIGH (ref 65–99)
Potassium: 4.3 mmol/L (ref 3.5–5.1)
Sodium: 141 mmol/L (ref 135–145)

## 2015-06-28 MED ORDER — ALBUTEROL SULFATE HFA 108 (90 BASE) MCG/ACT IN AERS
2.0000 | INHALATION_SPRAY | Freq: Once | RESPIRATORY_TRACT | Status: AC
  Administered 2015-06-28: 2 via RESPIRATORY_TRACT
  Filled 2015-06-28: qty 6.7

## 2015-06-28 MED ORDER — ALBUTEROL SULFATE HFA 108 (90 BASE) MCG/ACT IN AERS: 2.0000 | INHALATION_SPRAY | RESPIRATORY_TRACT | Status: DC | PRN

## 2015-06-28 MED ORDER — PREDNISONE 10 MG PO TABS: 50.0000 mg | ORAL_TABLET | Freq: Every day | ORAL | Status: DC

## 2015-06-28 MED ORDER — IPRATROPIUM-ALBUTEROL 0.5-2.5 (3) MG/3ML IN SOLN
3.0000 mL | Freq: Once | RESPIRATORY_TRACT | Status: AC
  Administered 2015-06-28: 3 mL via RESPIRATORY_TRACT
  Filled 2015-06-28: qty 3

## 2015-06-28 MED ORDER — PREDNISONE 20 MG PO TABS
60.0000 mg | ORAL_TABLET | Freq: Once | ORAL | Status: AC
  Administered 2015-06-28: 60 mg via ORAL
  Filled 2015-06-28: qty 3

## 2015-06-28 MED ORDER — AEROCHAMBER PLUS FLO-VU MEDIUM MISC
1.0000 | Freq: Once | Status: AC
  Administered 2015-06-28: 1
  Filled 2015-06-28: qty 1

## 2015-06-28 NOTE — ED Provider Notes (Signed)
CSN: [id number]     Arrival date & time 06/28/15  1710 History   First MD Initiated Contact with Patient 06/28/15 1857     Chief Complaint  Patient presents with  . Cough  . Shortness of Breath  . Nasal Congestion     (Consider location/radiation/quality/duration/timing/severity/associated sxs/prior Treatment) HPI 82 year old female who presents with cough and shortness of breath. She has a history of mild intermittent asthma, paroxysmal atrial fibrillation, hypertension. States that for the past 2 days she has had cough productive of yellow phlegm, increased shortness of breath, nasal congestion, and sore throat. No chest pain, lower extremity edema. No abdominal pain, nausea or vomiting, or urinary complaints. No fever or chills.   Past Medical History  Diagnosis Date  . PAF (paroxysmal atrial fibrillation) ([place])   . Essential hypertension   . Personal history of other diseases of digestive system   . Other abnormal glucose   . Odynophagia   . Pneumonia     hx of  . Arthritis   . Gout   . Diverticulitis   . DVT (deep venous thrombosis) ([place])     from birth control, left groin  . Cataract   . Hypercholesteremia   . GERD (gastroesophageal reflux disease)   . Depression   . Asthma     no problems recent  . Skin cancer 2014    skin R elbow & face  . Anemia     "I stay anemic"  . Fibromyalgia 10 y.a.  . Hx of cardiovascular stress test     Lexiscan Myoview (8/15): apical attenuation artifact, no ischemia, EF 69% - low risk.  . Chronic diastolic CHF (congestive heart failure), NYHA class 2 ([place]) 03/19/2014  . Sinus bradycardia   . CKD (chronic kidney disease), stage III    Past Surgical History  Procedure Laterality Date  . Laryngoscopy / bronchoscopy / esophagoscopy  2010    Dr. Benjamine Mola  . Laminectomy  05/2007    foraminotomy, L4-5 laminectomy  . Cholecystectomy  10/2005    Dr. Effie Shy  . Colonoscopy w/ biopsies  2005, 2011    Dr. Bing Plume, "balloon inside for last one at  [facility]"  . Skin cancer excision Right ~2005    r elbow  . Tubal ligation    . Appendectomy  ~55years ago  . Abdominal hysterectomy      partial  . Tumor removal Right ~ 20 years ago    tumor in between toes  . Knee arthroscopy Right   . Total knee arthroplasty Left 04/04/2013    Procedure: LEFT TOTAL KNEE ARTHROPLASTY;  Surgeon: Tobi Bastos, MD;  Location: [facility];  Service: Orthopedics;  Laterality: Left;   Family History  Problem Relation Age of Onset  . Heart disease Mother   . Hypertension Mother   . Fainting Mother   . Arrhythmia Mother   . Diabetes Father   . Stroke Father   . Hypertension Father   . Cancer Sister   . Cancer Brother   . Diabetes Sister   . Sudden death Brother   . Asthma Sister   . Heart attack Brother    Social History  Substance Use Topics  . Smoking status: Former Smoker -- 10 years    Types: Cigarettes    Quit date: 02/08/1973  . Smokeless tobacco: Never Used  . Alcohol Use: No   OB History    No data available     Review of Systems 10/14 systems reviewed and are  negative other than those stated in the HPI   Allergies  Amoxicillin; Diltiazem hcl; Sulfonamide derivatives; and Zetia  Home Medications   Prior to Admission medications   Medication Sig Start Date End Date Taking? Authorizing Provider  acetaminophen (TYLENOL) 500 MG tablet Take 1,000 mg by mouth every 6 (six) hours as needed for mild pain, moderate pain or headache.   Yes Historical Provider, MD  albuterol (PROVENTIL HFA;VENTOLIN HFA) 108 (90 BASE) MCG/ACT inhaler Inhale 1 puff into the lungs every 6 (six) hours as needed for wheezing or shortness of breath. 11/13/14  Yes Dorothy Spark, MD  alendronate (FOSAMAX) 70 MG tablet Take 70 mg by mouth once a week.  02/13/14  Yes Historical Provider, MD  aspirin EC 81 MG tablet Take 81 mg by mouth daily.   Yes Historical Provider, MD  cholecalciferol (VITAMIN D) 1000 units tablet Take 1,000 Units by mouth daily.    Yes Historical Provider, MD  Ferrous Sulfate 28 MG TABS Take 28 mg by mouth daily.    Yes Historical Provider, MD  HYDROcodone-acetaminophen (NORCO/VICODIN) 5-325 MG per tablet Take 1 tablet by mouth daily as needed for moderate pain (leg pain).    Yes Historical Provider, MD  montelukast (SINGULAIR) 10 MG tablet Take 10 mg by mouth at bedtime.     Yes Historical Provider, MD  Multiple Vitamins-Minerals (MULTIVITAMIN WITH MINERALS) tablet Take 1 tablet by mouth daily.   Yes Historical Provider, MD  Omega-3 Fatty Acids (FISH OIL BURP-LESS) 1200 MG CAPS Take 1 capsule by mouth daily.    Yes Historical Provider, MD  ondansetron (ZOFRAN) 4 MG tablet Take 4 mg by mouth every 8 (eight) hours as needed. For nausea 05/21/13  Yes Historical Provider, MD  pantoprazole (PROTONIX) 20 MG tablet Take 40 mg by mouth 2 (two) times daily.   Yes Historical Provider, MD  pravastatin (PRAVACHOL) 20 MG tablet Take 20 mg by mouth daily.  09/17/14  Yes Historical Provider, MD  Probiotic Product (PROBIOTIC ADVANCED PO) Take 1 tablet by mouth daily.   Yes Historical Provider, MD  sertraline (ZOLOFT) 50 MG tablet Take 50 mg by mouth every evening.  03/15/12  Yes Historical Provider, MD  vitamin E 400 UNIT capsule Take 400 Units by mouth daily.   Yes Historical Provider, MD  albuterol (PROVENTIL HFA;VENTOLIN HFA) 108 (90 Base) MCG/ACT inhaler Inhale 2 puffs into the lungs every 4 (four) hours as needed for wheezing or shortness of breath. 06/28/15   Forde Dandy, MD  furosemide (LASIX) 20 MG tablet Take 1 tablet (20 mg total) by mouth daily as needed for edema. Patient not taking: Reported on 06/28/2015 11/13/14   Dorothy Spark, MD  predniSONE (DELTASONE) 10 MG tablet Take 5 tablets (50 mg total) by mouth daily. 06/29/15   Forde Dandy, MD   BP 132/65 mmHg  Pulse 77  Temp(Src) 99.3 F (37.4 C) (Oral)  Resp 12  SpO2 100% Physical Exam Physical Exam  Nursing note and vitals reviewed. Constitutional: Well developed, well  nourished, non-toxic, and in no acute distress Head: Normocephalic and atraumatic.  Mouth/Throat: Oropharynx is clear and moist.  Neck: Normal range of motion. Neck supple.  Cardiovascular: Normal rate and regular rhythm.   Pulmonary/Chest: Effort normal. Faint expiratory wheezing and limited air movement. No conversational dyspnea.  Abdominal: Soft. There is no tenderness. There is no rebound and no guarding.  Musculoskeletal: Normal range of motion. No LE edema.  Neurological: Alert, no facial droop, fluent speech, moves all extremities  symmetrically Skin: Skin is warm and dry.  Psychiatric: Cooperative  ED Course  Procedures (including critical care time) Labs Review Labs Reviewed  CBC WITH DIFFERENTIAL/PLATELET - Abnormal; Notable for the following:    RBC 3.65 (*)    Hemoglobin 11.7 (*)    HCT 35.1 (*)    Platelets 135 (*)    All other components within normal limits  BASIC METABOLIC PANEL - Abnormal; Notable for the following:    Glucose, Bld 138 (*)    BUN 23 (*)    Creatinine, Ser 1.31 (*)    GFR calc non Af Amer 36 (*)    GFR calc Af Amer 42 (*)    All other components within normal limits    Imaging Review Dg Chest 2 View  06/28/2015  CLINICAL DATA:  Shortness of breath, congestion and cough for 2 days. History of pneumonia, CHF, asthma, skin cancer. Ex-smoker. EXAM: CHEST  2 VIEW COMPARISON:  Chest x-rays dated 09/05/2013 and 04/02/2013. FINDINGS: Cardiomediastinal silhouette is stable in size and configuration. Prominent pulmonary arteries suggests some degree of pulmonary artery hypertension. Mild scarring/ atelectasis again noted at the left lung base. No new airspace opacities. No evidence of pneumonia. No pleural effusion or pneumothorax seen. Degenerative changes again noted within the kyphotic thoracic spine. No acute- appearing osseous abnormality. IMPRESSION: Stable chest x-ray. No evidence of acute cardiopulmonary abnormality. Electronically Signed   By: Franki Cabot M.D.   On: 06/28/2015 18:30   I have personally reviewed and evaluated these images and lab results as part of my medical decision-making.   EKG Interpretation None      MDM   Final diagnoses:  Viral respiratory infection  Asthma exacerbation   82 year old female with h/o PAF, asthma, and HTN who presents with cough and dyspnea. Well appearing and in no acute distress. With normal vital signs, breathing comfortably on room air with normal oxygen. She has expiratory wheezing noted on exam. CXR shows no infiltrate, edema or other acute cardiopulmonary processes.  Basic blood work unremarkable. Presentation seem consistent with likely viral respiratory infection causing asthma exacerbation. Given breathing treatment, and she feels symptomatically improved. Is able to ambulate with normal pulse ox and normal work of breathing. We'll treat for asthma exacerbation with prednisone and breathing treatments for home. Discussed strict return and follow-up instructions.     Forde Dandy, MD 06/28/15 [phone]

## 2015-06-28 NOTE — ED Notes (Signed)
SpO2 while ambulating: 96%

## 2015-06-28 NOTE — ED Notes (Signed)
Pt reports productive cough, sob and a runny nose for the past 2 days.

## 2015-06-28 NOTE — Discharge Instructions (Signed)
Your chest x-ray does not show a pneumonia, and it is likely a viral infection that is causing an asthma flareup for you. Please use your albuterol inhaler and steroids as prescribed for your asthma flareup. Return without fail for worsening symptoms, including chest pain, difficulty breathing, passing out, fevers, or any other symptoms concerning to you.  Viral Infections A virus is a type of germ. Viruses can cause:  Minor sore throats.  Aches and pains.  Headaches.  Runny nose.  Rashes.  Watery eyes.  Tiredness.  Coughs.  Loss of appetite.  Feeling sick to your stomach (nausea).  Throwing up (vomiting).  Watery poop (diarrhea). HOME CARE   Only take medicines as told by your doctor.  Drink enough water and fluids to keep your pee (urine) clear or pale yellow. Sports drinks are a good choice.  Get plenty of rest and eat healthy. Soups and broths with crackers or rice are fine. GET HELP RIGHT AWAY IF:   You have a very bad headache.  You have shortness of breath.  You have chest pain or neck pain.  You have an unusual rash.  You cannot stop throwing up.  You have watery poop that does not stop.  You cannot keep fluids down.  You or your child has a temperature by mouth above 102 F (38.9 C), not controlled by medicine.  Your baby is older than 3 months with a rectal temperature of 102 F (38.9 C) or higher.  Your baby is 33 months old or younger with a rectal temperature of 100.4 F (38 C) or higher. MAKE SURE YOU:   Understand these instructions.  Will watch this condition.  Will get help right away if you are not doing well or get worse.   This information is not intended to replace advice given to you by your health care provider. Make sure you discuss any questions you have with your health care provider.   Document Released: 01/08/2008 Document Revised: 04/19/2011 Document Reviewed: 07/03/2014 Elsevier Interactive Patient Education 2016  [place].  Asthma, Adult Asthma is a condition of the lungs in which the airways tighten and narrow. Asthma can make it hard to breathe. Asthma cannot be cured, but medicine and lifestyle changes can help control it. Asthma may be started (triggered) by:  Animal skin flakes (dander).  Dust.  Cockroaches.  Pollen.  Mold.  Smoke.  Cleaning products.  Hair sprays or aerosol sprays.  Paint fumes or strong smells.  Cold air, weather changes, and winds.  Crying or laughing hard.  Stress.  Certain medicines or drugs.  Foods, such as dried fruit, potato chips, and sparkling grape juice.  Infections or conditions (colds, flu).  Exercise.  Certain medical conditions or diseases.  Exercise or tiring activities. HOME CARE   Take medicine as told by your doctor.  Use a peak flow meter as told by your doctor. A peak flow meter is a tool that measures how well the lungs are working.  Record and keep track of the peak flow meter's readings.  Understand and use the asthma action plan. An asthma action plan is a written plan for taking care of your asthma and treating your attacks.  To help prevent asthma attacks:  Do not smoke. Stay away from secondhand smoke.  Change your heating and air conditioning filter often.  Limit your use of fireplaces and wood stoves.  Get rid of pests (such as roaches and mice) and their droppings.  Throw away plants if you  see mold on them.  Clean your floors. Dust regularly. Use cleaning products that do not smell.  Have someone vacuum when you are not home. Use a vacuum cleaner with a HEPA filter if possible.  Replace carpet with wood, tile, or vinyl flooring. Carpet can trap animal skin flakes and dust.  Use allergy-proof pillows, mattress covers, and box spring covers.  Wash bed sheets and blankets every week in hot water and dry them in a dryer.  Use blankets that are made of polyester or cotton.  Clean bathrooms and  kitchens with bleach. If possible, have someone repaint the walls in these rooms with mold-resistant paint. Keep out of the rooms that are being cleaned and painted.  Wash hands often. GET HELP IF:  You have make a whistling sound when breaking (wheeze), have shortness of breath, or have a cough even if taking medicine to prevent attacks.  The colored mucus you cough up (sputum) is thicker than usual.  The colored mucus you cough up changes from clear or white to yellow, green, gray, or bloody.  You have problems from the medicine you are taking such as:  A rash.  Itching.  Swelling.  Trouble breathing.  You need reliever medicines more than 2-3 times a week.  Your peak flow measurement is still at 50-79% of your personal best after following the action plan for 1 hour.  You have a fever. GET HELP RIGHT AWAY IF:   You seem to be worse and are not responding to medicine during an asthma attack.  You are short of breath even at rest.  You get short of breath when doing very little activity.  You have trouble eating, drinking, or talking.  You have chest pain.  You have a fast heartbeat.  Your lips or fingernails start to turn blue.  You are light-headed, dizzy, or faint.  Your peak flow is less than 50% of your personal best.   This information is not intended to replace advice given to you by your health care provider. Make sure you discuss any questions you have with your health care provider.   Document Released: 07/14/2007 Document Revised: 10/16/2014 Document Reviewed: 08/24/2012 Elsevier Interactive Patient Education [employer].

## 2015-07-02 DIAGNOSIS — J45901 Unspecified asthma with (acute) exacerbation: Secondary | ICD-10-CM | POA: Diagnosis not present

## 2015-07-08 DIAGNOSIS — J45901 Unspecified asthma with (acute) exacerbation: Secondary | ICD-10-CM | POA: Diagnosis not present

## 2015-08-13 DIAGNOSIS — M545 Low back pain: Secondary | ICD-10-CM | POA: Diagnosis not present

## 2015-08-13 DIAGNOSIS — I6523 Occlusion and stenosis of bilateral carotid arteries: Secondary | ICD-10-CM | POA: Diagnosis not present

## 2015-08-13 DIAGNOSIS — R110 Nausea: Secondary | ICD-10-CM | POA: Diagnosis not present

## 2015-10-16 ENCOUNTER — Other Ambulatory Visit: Payer: Self-pay

## 2015-11-18 ENCOUNTER — Ambulatory Visit: Payer: Medicare Other | Admitting: Cardiology

## 2015-11-26 ENCOUNTER — Encounter: Payer: Self-pay | Admitting: Cardiology

## 2015-11-26 ENCOUNTER — Ambulatory Visit (INDEPENDENT_AMBULATORY_CARE_PROVIDER_SITE_OTHER): Payer: Medicare Other | Admitting: Cardiology

## 2015-11-26 VITALS — BP 106/70 | HR 74 | Ht 63.0 in | Wt 174.8 lb

## 2015-11-26 DIAGNOSIS — I480 Paroxysmal atrial fibrillation: Secondary | ICD-10-CM | POA: Diagnosis not present

## 2015-11-26 DIAGNOSIS — I10 Essential (primary) hypertension: Secondary | ICD-10-CM | POA: Diagnosis not present

## 2015-11-26 DIAGNOSIS — R42 Dizziness and giddiness: Secondary | ICD-10-CM

## 2015-11-26 DIAGNOSIS — R002 Palpitations: Secondary | ICD-10-CM | POA: Diagnosis not present

## 2015-11-26 DIAGNOSIS — I5032 Chronic diastolic (congestive) heart failure: Secondary | ICD-10-CM | POA: Diagnosis not present

## 2015-11-26 NOTE — Progress Notes (Signed)
Patient ID: [name], female   DOB: 1927/05/11, 82 y.o.   MRN: [id number]     Cardiology Office Note Date:  11/26/2015  Patient ID:  [name], DOB [birth year], MRN [id number] PCP:  Antony Blackbird, MD  Cardiologist:  Dr. Meda Coffee  Chief Complaint: n.  History of Present Illness: [name] is a 82 y.o. female with history of remote PAF, mild pulm HTN, HLD, h/o bradycardia with beta blockers and diltiazem, fibromyalgia, anemia, GERD, CKD stage III who presents for follow-up of dizziness. She was a prior patient of Dr. Winnifred Friar and current patient of Dr. Meda Coffee. Per review of chart she has not been on anticoagulation due to h/o falls.  Per review of chart, she established with Dr. Meda Coffee in 02/2013.She had knee surgery in February. Admitted in 07/2013 with gastroenteritis and dehydration. With hydration, she developed volume excess and was placed on Lasix. Last seen by Dr. Meda Coffee 08/08/13 and edema was improved. Diuretic regimen was continued. 2D echo showed mild LVH, EF 55-60%, grade 1 DD, mild MR, severe LAE. She later had to stop Lasix due to dizziness. Stress test 09/13/13 was low risk, EF 69%, no reversible ischemia. She was seen by Dr. Meda Coffee last week (09/18/14) at which she was reporting episodes of palpitations, dizziness, and upset stomach followed by diaphoresis. She as seen by her PCP who prescribed empiric antibiotics. 48 hour holter and Meclizine were recommended. No recent labs available. Last labs 08/2013: Cr 1.34, K 5.2, Na 137, Hgb 11.2.  She comes in today for recheck accompanied by two family friends who are both nurses. She is having the Holter placed today. She reports continued dizziness but says it goes away with the meclizine. She says this has helped quite a bit. The dizziness has returned this afternoon - she says she is due for another dose meclizine. In clinic she is in NSR with stable (modestly elevated) BP. Sometimes she has associated nausea. The dizziness is  particularly exacerbated by sudden head movements or looking in particular directions. She continues to report occasional palpitations at night. Denies chest pain and dyspnea. No recent URIs. No change in gait, aphasia, facial asymmetry, thought disruption or headache.  11/26/2015 - 6 months follow-up, patient reports that she feels about the same, she has occasional second lasting dizziness but no falls. She feels like her legs are getting weak and achy well for her but no falls. No palpitations or syncope. No chest pain or shortness of breath she's been compliant with her meds. She did not need to use Lasix.   Past Medical History:  Diagnosis Date  . Anemia    "I stay anemic"  . Arthritis   . Asthma    no problems recent  . Cataract   . Chronic diastolic CHF (congestive heart failure), NYHA class 2 ([place]) 03/19/2014  . CKD (chronic kidney disease), stage III   . Depression   . Diverticulitis   . DVT (deep venous thrombosis) ([place])    from birth control, left groin  . Essential hypertension   . Fibromyalgia 10 y.a.  . GERD (gastroesophageal reflux disease)   . Gout   . Hx of cardiovascular stress test    Lexiscan Myoview (8/15): apical attenuation artifact, no ischemia, EF 69% - low risk.  Marland Kitchen Hypercholesteremia   . Odynophagia   . Other abnormal glucose   . PAF (paroxysmal atrial fibrillation) ([place])   . Personal history of other diseases of digestive system   . Pneumonia  hx of  . Sinus bradycardia   . Skin cancer 2014   skin R elbow & face   Past Surgical History:  Procedure Laterality Date  . ABDOMINAL HYSTERECTOMY     partial  . APPENDECTOMY  ~55years ago  . CHOLECYSTECTOMY  10/2005   Dr. Effie Shy  . COLONOSCOPY W/ BIOPSIES  2005, 2011   Dr. Bing Plume, "balloon inside for last one at [facility]"  . KNEE ARTHROSCOPY Right   . LAMINECTOMY  05/2007   foraminotomy, L4-5 laminectomy  . LARYNGOSCOPY / BRONCHOSCOPY / ESOPHAGOSCOPY  2010   Dr. Benjamine Mola  . SKIN CANCER  EXCISION Right ~2005   r elbow  . TOTAL KNEE ARTHROPLASTY Left 04/04/2013   Procedure: LEFT TOTAL KNEE ARTHROPLASTY;  Surgeon: Tobi Bastos, MD;  Location: [facility];  Service: Orthopedics;  Laterality: Left;  . TUBAL LIGATION    . TUMOR REMOVAL Right ~ 20 years ago   tumor in between toes    Current Outpatient Prescriptions  Medication Sig Dispense Refill  . acetaminophen (TYLENOL) 500 MG tablet Take 1,000 mg by mouth every 6 (six) hours as needed for mild pain, moderate pain or headache.    . albuterol (PROVENTIL HFA;VENTOLIN HFA) 108 (90 BASE) MCG/ACT inhaler Inhale 1 puff into the lungs every 6 (six) hours as needed for wheezing or shortness of breath. 1 Inhaler 3  . alendronate (FOSAMAX) 70 MG tablet Take 70 mg by mouth once a week.     Marland Kitchen aspirin EC 81 MG tablet Take 81 mg by mouth daily.    . cholecalciferol (VITAMIN D) 1000 units tablet Take 1,000 Units by mouth daily.    . Ferrous Sulfate 28 MG TABS Take 28 mg by mouth daily.     . furosemide (LASIX) 20 MG tablet Take 1 tablet (20 mg total) by mouth daily as needed for edema. 90 tablet 3  . HYDROcodone-acetaminophen (NORCO/VICODIN) 5-325 MG per tablet Take 1 tablet by mouth daily as needed for moderate pain (leg pain).     . montelukast (SINGULAIR) 10 MG tablet Take 10 mg by mouth at bedtime.      . Multiple Vitamins-Minerals (MULTIVITAMIN WITH MINERALS) tablet Take 1 tablet by mouth daily.    . Omega-3 Fatty Acids (FISH OIL BURP-LESS) 1200 MG CAPS Take 1 capsule by mouth daily.     . ondansetron (ZOFRAN) 4 MG tablet Take 4 mg by mouth every 8 (eight) hours as needed. For nausea    . pantoprazole (PROTONIX) 20 MG tablet Take 40 mg by mouth 2 (two) times daily.    . pravastatin (PRAVACHOL) 20 MG tablet Take 20 mg by mouth daily.     . sertraline (ZOLOFT) 50 MG tablet Take 50 mg by mouth every evening.     Marland Kitchen ULORIC 40 MG tablet Take 1 tablet by mouth daily. For gout flare    . vitamin E 400 UNIT capsule Take 400 Units by mouth daily.      No current facility-administered medications for this visit.     Allergies:   Amoxicillin; Diltiazem hcl; Sulfonamide derivatives; and Zetia [ezetimibe]   Social History:  The patient  reports that she quit smoking about 42 years ago. Her smoking use included Cigarettes. She quit after 10.00 years of use. She has never used smokeless tobacco. She reports that she does not drink alcohol or use drugs.   Family History:  The patient's family history includes Arrhythmia in her mother; Asthma in her sister; Cancer in her brother and  sister; Diabetes in her father and sister; Fainting in her mother; Heart attack in her brother; Heart disease in her mother; Hypertension in her father and mother; Stroke in her father; Sudden death in her brother.  ROS:  Please see the history of present illness. She reports occasional loose/soft stools and urge to defecate almost always after eating. No overt diarrhea. All other systems are reviewed and otherwise negative.   PHYSICAL EXAM:  VS:  BP 106/70   Pulse 74   Ht 5\' 3"  (1.6 m)   Wt 174 lb 12.8 oz (79.3 kg)   SpO2 95%   BMI 30.96 kg/m  BMI: Body mass index is 30.96 kg/m. Well nourished, well developed elderly WF, in no acute distress  HEENT: normocephalic, atraumatic  Neck: no JVD, carotid bruits or masses Cardiac:  normal S1, S2; RRR; no murmurs, rubs, or gallops Lungs:  clear to auscultation bilaterally, no wheezing, rhonchi or rales  Abd: soft, nontender, no hepatomegaly, + BS MS: no deformity or atrophy Ext: no edema  Skin: warm and dry, no rash Neuro:  moves all extremities spontaneously, no focal abnormalities noted, follows commands, strength 5/5 UE LE bilaterally, mild horizontal nystagmus but no vertical nystagmus, no tremor, walks with cane for gait, normal facial symmetry, normal tone and cadence of speech Psych: euthymic mood, full affect  EKG:  Done today shows NSR 64bpm, LAFB, no acute ST-T changes  Recent Labs: 06/28/2015: BUN  23; Creatinine, Ser 1.31; Hemoglobin 11.7; Platelets 135; Potassium 4.3; Sodium 141  No results found for requested labs within last 8760 hours.   CrCl cannot be calculated (Patient's most recent lab result is older than the maximum 21 days allowed.).   Wt Readings from Last 3 Encounters:  11/26/15 174 lb 12.8 oz (79.3 kg)  05/27/15 182 lb (82.6 kg)  11/13/14 183 lb 9.6 oz (83.3 kg)   Holter monitor:  Sinus bradycardia to sinus tachycardia, profound sinus arrhythmia and increased vagal tone with episodes of sinus bradycardia dwon to 43 BPM but no pause longer then 2 seconds.  Very short episodes of atrial tachycardia.                                 No significant pauses found, increased vagal tone, the patient should stay well hydrated.   EKG done today 05/27/2015 shows sinus bradycardia left anterior fascicular block that is unchanged from prior.    ASSESSMENT AND PLAN:  1. Dizziness - event monitor showed sinus arrhythmias, sinus bradycardia with no pause longer than 2 seconds. Improved with meclizine. No recent fall. Her EKG today shows sinus bradycardia and left anterior fascicular block that is unchanged from prior last year.  2. H/o PAF - event monitor as above. Not on anticoag due to h/o fall risk and gait instability. No recent palpitations.  3.  Chronic diastolic CHF, LE edema - continue lasix 20 mg po QD PRN, she doesn't tolerate Lasix as she gets cramps in her legs as she doesn't use it.  4. Chest pain - sounds atypical and more like reflux disease, normal stress test in 2015.  Disposition: F/u with Dr. Meda Coffee in 6 months.  Signed, Ena Dawley 11/26/2015 2:02 PM

## 2015-11-26 NOTE — Patient Instructions (Signed)

## 2015-12-04 DIAGNOSIS — Z23 Encounter for immunization: Secondary | ICD-10-CM | POA: Diagnosis not present

## 2015-12-05 DIAGNOSIS — N183 Chronic kidney disease, stage 3 (moderate): Secondary | ICD-10-CM | POA: Diagnosis not present

## 2015-12-05 DIAGNOSIS — I129 Hypertensive chronic kidney disease with stage 1 through stage 4 chronic kidney disease, or unspecified chronic kidney disease: Secondary | ICD-10-CM | POA: Diagnosis not present

## 2015-12-05 DIAGNOSIS — Z6831 Body mass index (BMI) 31.0-31.9, adult: Secondary | ICD-10-CM | POA: Diagnosis not present

## 2016-01-16 ENCOUNTER — Other Ambulatory Visit: Payer: Self-pay | Admitting: Family

## 2016-01-16 DIAGNOSIS — Z1231 Encounter for screening mammogram for malignant neoplasm of breast: Secondary | ICD-10-CM

## 2016-02-06 ENCOUNTER — Ambulatory Visit
Admission: RE | Admit: 2016-02-06 | Discharge: 2016-02-06 | Disposition: A | Discharge status: Home / Self Care | Payer: Medicare Other | Source: Ambulatory Visit | Attending: Orthopedic Surgery | Admitting: Orthopedic Surgery

## 2016-02-06 ENCOUNTER — Other Ambulatory Visit: Payer: Self-pay | Admitting: Orthopedic Surgery

## 2016-02-06 DIAGNOSIS — S32592A Other specified fracture of left pubis, initial encounter for closed fracture: Secondary | ICD-10-CM

## 2016-02-06 DIAGNOSIS — S32501A Unspecified fracture of right pubis, initial encounter for closed fracture: Secondary | ICD-10-CM | POA: Diagnosis not present

## 2016-02-10 ENCOUNTER — Other Ambulatory Visit: Payer: Self-pay | Admitting: Family

## 2016-02-10 DIAGNOSIS — M545 Low back pain: Secondary | ICD-10-CM | POA: Diagnosis not present

## 2016-02-10 DIAGNOSIS — S7002XD Contusion of left hip, subsequent encounter: Secondary | ICD-10-CM | POA: Diagnosis not present

## 2016-02-10 DIAGNOSIS — S7001XD Contusion of right hip, subsequent encounter: Secondary | ICD-10-CM | POA: Diagnosis not present

## 2016-02-17 DIAGNOSIS — M545 Low back pain: Secondary | ICD-10-CM | POA: Diagnosis not present

## 2016-02-23 ENCOUNTER — Ambulatory Visit
Admission: RE | Admit: 2016-02-23 | Discharge: 2016-02-23 | Disposition: A | Discharge status: Home / Self Care | Payer: Medicare Other | Source: Ambulatory Visit | Attending: Family | Admitting: Family

## 2016-02-23 DIAGNOSIS — G8929 Other chronic pain: Secondary | ICD-10-CM | POA: Diagnosis not present

## 2016-02-23 DIAGNOSIS — M5442 Lumbago with sciatica, left side: Secondary | ICD-10-CM | POA: Diagnosis not present

## 2016-02-23 DIAGNOSIS — S7002XD Contusion of left hip, subsequent encounter: Secondary | ICD-10-CM | POA: Diagnosis not present

## 2016-02-23 DIAGNOSIS — Z1231 Encounter for screening mammogram for malignant neoplasm of breast: Secondary | ICD-10-CM | POA: Diagnosis not present

## 2016-02-23 DIAGNOSIS — S7001XD Contusion of right hip, subsequent encounter: Secondary | ICD-10-CM | POA: Diagnosis not present

## 2016-02-24 DIAGNOSIS — S22080A Wedge compression fracture of T11-T12 vertebra, initial encounter for closed fracture: Secondary | ICD-10-CM | POA: Diagnosis not present

## 2016-04-01 ENCOUNTER — Ambulatory Visit (INDEPENDENT_AMBULATORY_CARE_PROVIDER_SITE_OTHER): Payer: Medicare Other | Admitting: Cardiology

## 2016-04-01 ENCOUNTER — Encounter: Payer: Self-pay | Admitting: Cardiology

## 2016-04-01 ENCOUNTER — Encounter: Payer: Self-pay | Admitting: Student

## 2016-04-01 ENCOUNTER — Ambulatory Visit (INDEPENDENT_AMBULATORY_CARE_PROVIDER_SITE_OTHER): Payer: Medicare Other | Admitting: Student

## 2016-04-01 VITALS — BP 164/72 | HR 54 | Ht 63.0 in | Wt 169.0 lb

## 2016-04-01 VITALS — BP 154/82 | HR 61 | Temp 97.9°F | Wt 169.0 lb

## 2016-04-01 DIAGNOSIS — R001 Bradycardia, unspecified: Secondary | ICD-10-CM | POA: Diagnosis not present

## 2016-04-01 DIAGNOSIS — I480 Paroxysmal atrial fibrillation: Secondary | ICD-10-CM

## 2016-04-01 DIAGNOSIS — I495 Sick sinus syndrome: Secondary | ICD-10-CM

## 2016-04-01 DIAGNOSIS — I10 Essential (primary) hypertension: Secondary | ICD-10-CM

## 2016-04-01 DIAGNOSIS — R600 Localized edema: Secondary | ICD-10-CM | POA: Diagnosis not present

## 2016-04-01 DIAGNOSIS — I110 Hypertensive heart disease with heart failure: Secondary | ICD-10-CM | POA: Diagnosis not present

## 2016-04-01 DIAGNOSIS — R42 Dizziness and giddiness: Secondary | ICD-10-CM

## 2016-04-01 DIAGNOSIS — I119 Hypertensive heart disease without heart failure: Secondary | ICD-10-CM | POA: Insufficient documentation

## 2016-04-01 DIAGNOSIS — M109 Gout, unspecified: Secondary | ICD-10-CM | POA: Diagnosis not present

## 2016-04-01 DIAGNOSIS — I5032 Chronic diastolic (congestive) heart failure: Secondary | ICD-10-CM

## 2016-04-01 DIAGNOSIS — R002 Palpitations: Secondary | ICD-10-CM

## 2016-04-01 DIAGNOSIS — W19XXXA Unspecified fall, initial encounter: Secondary | ICD-10-CM | POA: Insufficient documentation

## 2016-04-01 MED ORDER — PANTOPRAZOLE SODIUM 20 MG PO TBEC: 40.0000 mg | DELAYED_RELEASE_TABLET | Freq: Two times a day (BID) | ORAL | 3 refills | Status: DC

## 2016-04-01 MED ORDER — ALLOPURINOL 100 MG PO TABS: 100.0000 mg | ORAL_TABLET | Freq: Every day | ORAL | 6 refills | Status: DC

## 2016-04-01 MED ORDER — LOSARTAN POTASSIUM 25 MG PO TABS: 25.0000 mg | ORAL_TABLET | Freq: Every day | ORAL | 3 refills | Status: DC

## 2016-04-01 NOTE — Assessment & Plan Note (Signed)
Allopurinol started in place of uloric as allopurinol is much less expensive - uric acid at next visit

## 2016-04-01 NOTE — Patient Instructions (Signed)
Follow up in 2 months for lab check If you have any questions or concerns, call the office at [phone]

## 2016-04-01 NOTE — Patient Instructions (Signed)
Medication Instructions:   STOP TAKING LASIX NOW  START TAKING LOSARTAN 25 MG ONCE DAILY     Testing/Procedures:  Your physician has recommended that you wear an event monitor. Event monitors are medical devices that record the heart's electrical activity. Doctors most often [place] these monitors to diagnose arrhythmias. Arrhythmias are problems with the speed or rhythm of the heartbeat. The monitor is a small, portable device. You can wear one while you do your normal daily activities. This is usually used to diagnose what is causing palpitations/syncope (passing out).  PER DR NELSON, YOU ONLY HAVE TO WEAR THIS FOR 2 WEEKS     Follow-Up:  6 WEEKS WITH DR NELSON---PLEASE ADD ON TO ONE OF DR NELSON'S QUARTER DAYS AT THE END SLOT        If you need a refill on your cardiac medications before your next appointment, please call your pharmacy.

## 2016-04-01 NOTE — Progress Notes (Signed)
Subjective:    Patient ID: [name], female    DOB: 09-16-1927, 83 y.o.   MRN: [id number]   CC: establish care  HPI: 83 y/o F presents to establish care  She denies any issues today, deneis chest pain, SOB, recent illnesses or fevers. She did see her cardiologist this AM and was started on losartan   Hypertension - Saw her cardiologist this AM for HTN in addition to her other chonic cardiac issues - she denies headache, chest pain or SOB at this time  Gout She does request a refill for gout medication although she is unsure how much it will cost as she recently discovered it will not be covered by her insurance. She takes [name] - requests a refill for protonix  Past Medical History:  Diagnosis Date  . Anemia    "I stay anemic"  . Arthritis   . Asthma    no problems recent  . Cataract   . Chronic diastolic CHF (congestive heart failure), NYHA class 2 ([place]) 03/19/2014  . CKD (chronic kidney disease), stage III   . Depression   . Diverticulitis   . DVT (deep venous thrombosis) ([place])    from birth control, left groin  . Essential hypertension   . Fibromyalgia 10 y.a.  . GERD (gastroesophageal reflux disease)   . Gout   . Hx of cardiovascular stress test    Lexiscan Myoview (8/15): apical attenuation artifact, no ischemia, EF 69% - low risk.  Marland Kitchen Hypercholesteremia   . Odynophagia   . Other abnormal glucose   . PAF (paroxysmal atrial fibrillation) ([place])   . Personal history of other diseases of digestive system   . Pneumonia    hx of  . Sinus bradycardia   . Skin cancer 2014   skin R elbow & face    Past Surgical History:  Procedure Laterality Date  . ABDOMINAL HYSTERECTOMY     partial  . APPENDECTOMY  ~55years ago  . CHOLECYSTECTOMY  10/2005   Dr. Effie Shy  . COLONOSCOPY W/ BIOPSIES  2005, 2011   Dr. Bing Plume, "balloon inside for last one at [facility]"  . KNEE ARTHROSCOPY Right   . LAMINECTOMY  05/2007   foraminotomy, L4-5 laminectomy    . LARYNGOSCOPY / BRONCHOSCOPY / ESOPHAGOSCOPY  2010   Dr. Benjamine Mola  . SKIN CANCER EXCISION Right ~2005   r elbow  . TOTAL KNEE ARTHROPLASTY Left 04/04/2013   Procedure: LEFT TOTAL KNEE ARTHROPLASTY;  Surgeon: Tobi Bastos, MD;  Location: [facility];  Service: Orthopedics;  Laterality: Left;  . TUBAL LIGATION    . TUMOR REMOVAL Right ~ 20 years ago   tumor in between toes    Current Outpatient Prescriptions on File Prior to Visit  Medication Sig Dispense Refill  . acetaminophen (TYLENOL) 500 MG tablet Take 1,000 mg by mouth every 6 (six) hours as needed for mild pain, moderate pain or headache.    . alendronate (FOSAMAX) 70 MG tablet Take 70 mg by mouth once a week.     Marland Kitchen aspirin EC 81 MG tablet Take 81 mg by mouth daily.    . cholecalciferol (VITAMIN D) 1000 units tablet Take 1,000 Units by mouth daily.    . Ferrous Sulfate 28 MG TABS Take 28 mg by mouth daily.     Marland Kitchen losartan (COZAAR) 25 MG tablet Take 1 tablet (25 mg total) by mouth daily. 90 tablet 3  . montelukast (SINGULAIR) 10 MG tablet Take 10 mg by  mouth at bedtime.      . Multiple Vitamins-Minerals (MULTIVITAMIN WITH MINERALS) tablet Take 1 tablet by mouth daily.    . Omega-3 Fatty Acids (FISH OIL BURP-LESS) 1200 MG CAPS Take 1 capsule by mouth daily.     . ondansetron (ZOFRAN) 4 MG tablet Take 4 mg by mouth every 8 (eight) hours as needed. For nausea    . pravastatin (PRAVACHOL) 20 MG tablet Take 20 mg by mouth daily.     . sertraline (ZOLOFT) 50 MG tablet Take 50 mg by mouth every evening.     . vitamin E 400 UNIT capsule Take 400 Units by mouth daily.    Marland Kitchen albuterol (PROVENTIL HFA;VENTOLIN HFA) 108 (90 BASE) MCG/ACT inhaler Inhale 1 puff into the lungs every 6 (six) hours as needed for wheezing or shortness of breath. (Patient not taking: Reported on 04/01/2016) 1 Inhaler 3   No current facility-administered medications on file prior to visit.     Family History  Problem Relation Age of Onset  . Heart disease Mother   .  Hypertension Mother   . Fainting Mother   . Arrhythmia Mother   . Diabetes Father   . Stroke Father   . Hypertension Father   . Cancer Brother   . Sudden death Brother   . Heart attack Brother   . Cancer Sister   . Diabetes Sister   . Asthma Sister     Social History  Substance Use Topics  . Smoking status: Former Smoker    Years: 10.00    Types: Cigarettes    Quit date: 02/08/1973  . Smokeless tobacco: Never Used  . Alcohol use No     Review of Systems  Per HPI    Objective:  BP (!) 154/82   Pulse 61   Temp 97.9 F (36.6 C) (Oral)   Wt 169 lb (76.7 kg)   SpO2 96%   BMI 29.94 kg/m  Vitals and nursing note reviewed  General: NAD, pleasant Cardiac: RRR Respiratory: CTAB, normal effort Extremities: no edema or cyanosis.  Skin: warm and dry, no rashes noted Neuro: alert and oriented, no focal deficits   Assessment & Plan:    HYPERTENSION, UNSPECIFIED Blood pressure 154/82, midly above goal. She has been prescribed losartan today but has yet to pick it up - will follow along  Gout Allopurinol started in place of uloric as allopurinol is much less expensive - uric acid at next visit   GERD (gastroesophageal reflux disease) Protonix refilled today    [name] A. Michelle Brigham MD, [facility] Resident PGY-3 Pager [phone]

## 2016-04-01 NOTE — Assessment & Plan Note (Signed)
Protonix refilled today

## 2016-04-01 NOTE — Progress Notes (Signed)
Patient ID: [name], female   DOB: [birth year], 83 y.o.   MRN: [id number]     Cardiology Office Note Date:  04/01/2016  Patient ID:  [name], DOB Jul 09, 1927, MRN [id number] PCP:  Antony Blackbird, MD  Cardiologist:  Dr. Meda Coffee  Chief Complaint:   History of Present Illness: [name] is a 83 y.o. female with history of remote PAF, mild pulm HTN, HLD, h/o bradycardia with beta blockers and diltiazem, fibromyalgia, anemia, GERD, CKD stage III who presents for follow-up of dizziness. She was a prior patient of Dr. Winnifred Friar and current patient of Dr. Meda Coffee. Per review of chart she has not been on anticoagulation due to h/o falls.  Per review of chart, she established with Dr. Meda Coffee in 02/2013.She had knee surgery in February. Admitted in 07/2013 with gastroenteritis and dehydration. With hydration, she developed volume excess and was placed on Lasix. Last seen by Dr. Meda Coffee 08/08/13 and edema was improved. Diuretic regimen was continued. 2D echo showed mild LVH, EF 55-60%, grade 1 DD, mild MR, severe LAE. She later had to stop Lasix due to dizziness. Stress test 09/13/13 was low risk, EF 69%, no reversible ischemia. She was seen by Dr. Meda Coffee last week (09/18/14) at which she was reporting episodes of palpitations, dizziness, and upset stomach followed by diaphoresis. She as seen by her PCP who prescribed empiric antibiotics. 48 hour holter and Meclizine were recommended. No recent labs available. Last labs 08/2013: Cr 1.34, K 5.2, Na 137, Hgb 11.2.  She comes in today for recheck accompanied by two family friends who are both nurses. She is having the Holter placed today. She reports continued dizziness but says it goes away with the meclizine. She says this has helped quite a bit. The dizziness has returned this afternoon - she says she is due for another dose meclizine. In clinic she is in NSR with stable (modestly elevated) BP. Sometimes she has associated nausea. The dizziness is  particularly exacerbated by sudden head movements or looking in particular directions. She continues to report occasional palpitations at night. Denies chest pain and dyspnea. No recent URIs. No change in gait, aphasia, facial asymmetry, thought disruption or headache.  11/26/2015 - 6 months follow-up, patient reports that she feels about the same, she has occasional second lasting dizziness but no falls. She feels like her legs are getting weak and achy well for her but no falls. No palpitations or syncope. No chest pain or shortness of breath she's been compliant with her meds. She did not need to use Lasix.  04/01/2016 - the patient is coming after 3 months, she has been experiencing falls, on one occasion she tripped and developed pelvis (pubic ramus) fracture. She has fell couple times since then. Approximately 2 days a week she feels dizzy, it is not clear if it's related to use of tramadol to control her fracture pain. She nicely any chest pain or shortness of breath she feels that her heart rate is very slow especially at night.she denies any palpitations associated with her falls, but only on one occasion it was mechanical fallthe other are not explained.   Past Medical History:  Diagnosis Date  . Anemia    "I stay anemic"  . Arthritis   . Asthma    no problems recent  . Cataract   . Chronic diastolic CHF (congestive heart failure), NYHA class 2 ([place]) 03/19/2014  . CKD (chronic kidney disease), stage III   . Depression   . Diverticulitis   .  DVT (deep venous thrombosis) ([place])    from birth control, left groin  . Essential hypertension   . Fibromyalgia 10 y.a.  . GERD (gastroesophageal reflux disease)   . Gout   . Hx of cardiovascular stress test    Lexiscan Myoview (8/15): apical attenuation artifact, no ischemia, EF 69% - low risk.  Marland Kitchen Hypercholesteremia   . Odynophagia   . Other abnormal glucose   . PAF (paroxysmal atrial fibrillation) ([place])   . Personal history of other  diseases of digestive system   . Pneumonia    hx of  . Sinus bradycardia   . Skin cancer 2014   skin R elbow & face   Past Surgical History:  Procedure Laterality Date  . ABDOMINAL HYSTERECTOMY     partial  . APPENDECTOMY  ~55years ago  . CHOLECYSTECTOMY  10/2005   Dr. Effie Shy  . COLONOSCOPY W/ BIOPSIES  2005, 2011   Dr. Bing Plume, "balloon inside for last one at [facility]"  . KNEE ARTHROSCOPY Right   . LAMINECTOMY  05/2007   foraminotomy, L4-5 laminectomy  . LARYNGOSCOPY / BRONCHOSCOPY / ESOPHAGOSCOPY  2010   Dr. Benjamine Mola  . SKIN CANCER EXCISION Right ~2005   r elbow  . TOTAL KNEE ARTHROPLASTY Left 04/04/2013   Procedure: LEFT TOTAL KNEE ARTHROPLASTY;  Surgeon: Tobi Bastos, MD;  Location: [facility];  Service: Orthopedics;  Laterality: Left;  . TUBAL LIGATION    . TUMOR REMOVAL Right ~ 20 years ago   tumor in between toes    Current Outpatient Prescriptions  Medication Sig Dispense Refill  . acetaminophen (TYLENOL) 500 MG tablet Take 1,000 mg by mouth every 6 (six) hours as needed for mild pain, moderate pain or headache.    . albuterol (PROVENTIL HFA;VENTOLIN HFA) 108 (90 BASE) MCG/ACT inhaler Inhale 1 puff into the lungs every 6 (six) hours as needed for wheezing or shortness of breath. 1 Inhaler 3  . alendronate (FOSAMAX) 70 MG tablet Take 70 mg by mouth once a week.     Marland Kitchen aspirin EC 81 MG tablet Take 81 mg by mouth daily.    . calcitonin, salmon, (MIACALCIN/FORTICAL) 200 UNIT/ACT nasal spray as directed.    . cholecalciferol (VITAMIN D) 1000 units tablet Take 1,000 Units by mouth daily.    . Ferrous Sulfate 28 MG TABS Take 28 mg by mouth daily.     Marland Kitchen HYDROcodone-acetaminophen (NORCO/VICODIN) 5-325 MG per tablet Take 1 tablet by mouth daily as needed for moderate pain (leg pain).     . montelukast (SINGULAIR) 10 MG tablet Take 10 mg by mouth at bedtime.      . Multiple Vitamins-Minerals (MULTIVITAMIN WITH MINERALS) tablet Take 1 tablet by mouth daily.    . Omega-3  Fatty Acids (FISH OIL BURP-LESS) 1200 MG CAPS Take 1 capsule by mouth daily.     . ondansetron (ZOFRAN) 4 MG tablet Take 4 mg by mouth every 8 (eight) hours as needed. For nausea    . pantoprazole (PROTONIX) 20 MG tablet Take 40 mg by mouth 2 (two) times daily.    . pravastatin (PRAVACHOL) 20 MG tablet Take 20 mg by mouth daily.     . sertraline (ZOLOFT) 50 MG tablet Take 50 mg by mouth every evening.     Marland Kitchen ULORIC 40 MG tablet Take 1 tablet by mouth daily. For gout flare    . vitamin E 400 UNIT capsule Take 400 Units by mouth daily.    Marland Kitchen losartan (COZAAR) 25 MG tablet  Take 1 tablet (25 mg total) by mouth daily. 90 tablet 3   No current facility-administered medications for this visit.     Allergies:   Amoxicillin; Diltiazem hcl; Sulfonamide derivatives; and Zetia [ezetimibe]   Social History:  The patient  reports that she quit smoking about 43 years ago. Her smoking use included Cigarettes. She quit after 10.00 years of use. She has never used smokeless tobacco. She reports that she does not drink alcohol or use drugs.   Family History:  The patient's family history includes Arrhythmia in her mother; Asthma in her sister; Cancer in her brother and sister; Diabetes in her father and sister; Fainting in her mother; Heart attack in her brother; Heart disease in her mother; Hypertension in her father and mother; Stroke in her father; Sudden death in her brother.  ROS:  Please see the history of present illness. She reports occasional loose/soft stools and urge to defecate almost always after eating. No overt diarrhea. All other systems are reviewed and otherwise negative.   PHYSICAL EXAM:  VS:  BP (!) 164/72   Pulse (!) 54   Ht 5\' 3"  (1.6 m)   Wt 169 lb (76.7 kg)   SpO2 95%   BMI 29.94 kg/m  BMI: Body mass index is 29.94 kg/m. Well nourished, well developed elderly WF, in no acute distress  HEENT: normocephalic, atraumatic  Neck: no JVD, carotid bruits or masses Cardiac:  normal S1, S2;  RRR; no murmurs, rubs, or gallops Lungs:  clear to auscultation bilaterally, no wheezing, rhonchi or rales  Abd: soft, nontender, no hepatomegaly, + BS MS: no deformity or atrophy Ext: no edema  Skin: warm and dry, no rash Neuro:  moves all extremities spontaneously, no focal abnormalities noted, follows commands, strength 5/5 UE LE bilaterally, mild horizontal nystagmus but no vertical nystagmus, no tremor, walks with cane for gait, normal facial symmetry, normal tone and cadence of speech Psych: euthymic mood, full affect  EKG:  Done today shows NSR 64bpm, LAFB, no acute ST-T changes  Recent Labs: 06/28/2015: BUN 23; Creatinine, Ser 1.31; Hemoglobin 11.7; Platelets 135; Potassium 4.3; Sodium 141  No results found for requested labs within last 8760 hours.   CrCl cannot be calculated (Patient's most recent lab result is older than the maximum 21 days allowed.).   Wt Readings from Last 3 Encounters:  04/01/16 169 lb (76.7 kg)  11/26/15 174 lb 12.8 oz (79.3 kg)  05/27/15 182 lb (82.6 kg)   Holter monitor:  Sinus bradycardia to sinus tachycardia, profound sinus arrhythmia and increased vagal tone with episodes of sinus bradycardia dwon to 43 BPM but no pause longer then 2 seconds.  Very short episodes of atrial tachycardia.                                 No significant pauses found, increased vagal tone, the patient should stay well hydrated.   EKG done today 04/01/2016 shows sinus bradycardia with PVCs left anterior fascicular block   ASSESSMENT AND PLAN:  1. Dizziness - now with recurrent falls and a fracture, event monitor in 2016showed sinus arrhythmias, significant sinus bradycardia with no pause longer than 2 seconds. Improved with meclizine in 2016. We will repeat 2 week event monitor. We will discontinue Lasix to eliminate possible orthostatic hypotension.  2. Hypertension -elevated on multiple occasions lately.Add losartan 25 mg by mouth daily. Discontinue Lasix as it  might be contributing to orthostatic hypotension.  3. H/o PAF - we will repeat event monitor.  4.  Chronic diastolic CHF, LE edema - discontinue lasix as above.  5. Chest pain - sounds atypical and more like reflux disease, normal stress test in 2015.  Disposition: F/u with Dr. Meda Coffee in 6 months.  Romeo Rabon 04/01/2016 9:54 AM

## 2016-04-01 NOTE — Assessment & Plan Note (Signed)
Blood pressure 154/82, midly above goal. She has been prescribed losartan today but has yet to pick it up - will follow along

## 2016-04-07 ENCOUNTER — Ambulatory Visit (INDEPENDENT_AMBULATORY_CARE_PROVIDER_SITE_OTHER): Payer: Medicare Other

## 2016-04-07 DIAGNOSIS — I110 Hypertensive heart disease with heart failure: Secondary | ICD-10-CM

## 2016-04-07 DIAGNOSIS — R001 Bradycardia, unspecified: Secondary | ICD-10-CM

## 2016-04-07 DIAGNOSIS — R42 Dizziness and giddiness: Secondary | ICD-10-CM | POA: Diagnosis not present

## 2016-04-07 DIAGNOSIS — W19XXXA Unspecified fall, initial encounter: Secondary | ICD-10-CM | POA: Diagnosis not present

## 2016-04-08 DIAGNOSIS — M8468XD Pathological fracture in other disease, other site, subsequent encounter for fracture with routine healing: Secondary | ICD-10-CM | POA: Diagnosis not present

## 2016-04-15 ENCOUNTER — Telehealth: Payer: Self-pay | Admitting: Student

## 2016-04-15 MED ORDER — PANTOPRAZOLE SODIUM 40 MG PO TBEC: 40.0000 mg | DELAYED_RELEASE_TABLET | Freq: Every day | ORAL | 3 refills | Status: DC

## 2016-04-15 MED ORDER — CLINDAMYCIN HCL 150 MG PO CAPS: 450.0000 mg | ORAL_CAPSULE | Freq: Three times a day (TID) | ORAL | 0 refills | Status: DC

## 2016-04-15 NOTE — Telephone Encounter (Signed)
protonix ordered.

## 2016-04-21 DIAGNOSIS — I4891 Unspecified atrial fibrillation: Secondary | ICD-10-CM | POA: Diagnosis not present

## 2016-04-21 DIAGNOSIS — I10 Essential (primary) hypertension: Secondary | ICD-10-CM | POA: Diagnosis not present

## 2016-04-21 DIAGNOSIS — M109 Gout, unspecified: Secondary | ICD-10-CM | POA: Diagnosis not present

## 2016-04-21 DIAGNOSIS — M4854XA Collapsed vertebra, not elsewhere classified, thoracic region, initial encounter for fracture: Secondary | ICD-10-CM | POA: Diagnosis not present

## 2016-04-21 DIAGNOSIS — E782 Mixed hyperlipidemia: Secondary | ICD-10-CM | POA: Diagnosis not present

## 2016-05-05 ENCOUNTER — Telehealth: Payer: Self-pay | Admitting: Cardiology

## 2016-05-05 ENCOUNTER — Encounter: Payer: Self-pay | Admitting: Cardiology

## 2016-05-05 NOTE — Telephone Encounter (Signed)
Patient calling to see when she can remove the monitor. Please call to discuss,thanks.

## 2016-05-05 NOTE — Telephone Encounter (Signed)
Spoke to patient and informed her she can mail the monitor back today.

## 2016-05-11 DIAGNOSIS — Z961 Presence of intraocular lens: Secondary | ICD-10-CM | POA: Diagnosis not present

## 2016-05-18 ENCOUNTER — Telehealth: Payer: Self-pay | Admitting: Cardiology

## 2016-05-18 NOTE — Telephone Encounter (Signed)
Notified the pt that per Dr Meda Coffee, her event monitor showed no arrhythmias that would identify why she is falling.  Did inform the pt that Dr Meda Coffee does want her to see Dr Lovena Le in EP, for noted afib and PVCs.  Informed the pt that Dr Tanna Furry scheduler [name], will be calling her back to arrange this appt.  Pt states she was suppose to see Dr Meda Coffee tomorrow, but had to cancel this appt due to current family issues that have come up.  Pt states she will agree to see Dr Lovena Le, but wanted to make Dr Meda Coffee aware that she is sorry for the cancellation to see her tomorrow.  Informed the pt that I will make Dr Meda Coffee aware of this.  Pt verbalized understanding and agrees with this plan.

## 2016-05-18 NOTE — Telephone Encounter (Signed)
New Message  Pt voiced she would like to speak with nurse in regards to her appt she cancelled.  Please f/u

## 2016-05-19 ENCOUNTER — Ambulatory Visit: Payer: Medicare Other | Admitting: Cardiology

## 2016-05-19 ENCOUNTER — Telehealth: Payer: Self-pay | Admitting: General Practice

## 2016-05-19 NOTE — Telephone Encounter (Signed)
The patient called me back.  I spoke with her granddaughter who confirmed that the patient now sees Eloise Levels with [facility] as her PCP. Duane Lope ([place])

## 2016-05-19 NOTE — Telephone Encounter (Signed)
I left a message letting the patient know that I wanted to follow up with her since leaving the nursing home.  I'll follow up again later if I don't hear back from her. V. McCain ([place])

## 2016-05-31 ENCOUNTER — Telehealth: Payer: Self-pay | Admitting: Cardiology

## 2016-05-31 NOTE — Telephone Encounter (Signed)
Pt calling to confirm her upcoming appt with Dr Lovena Le.  Informed the pt that she has an upcoming appt with Dr Lovena Le, as a new pt appt, on next Tuesday 06/08/16 at 1015.  Advised the pt to arrive 15 mins prior too that appt.  Advised the pt that its the same location she see's Dr Meda Coffee at.  Pt verbalized understanding and agrees with this plan.

## 2016-05-31 NOTE — Telephone Encounter (Signed)
New message    Pt is calling for RN because she has a question about upcoming appt.

## 2016-06-04 ENCOUNTER — Encounter: Payer: Self-pay | Admitting: Internal Medicine

## 2016-06-08 ENCOUNTER — Ambulatory Visit (INDEPENDENT_AMBULATORY_CARE_PROVIDER_SITE_OTHER): Payer: Medicare Other | Admitting: Internal Medicine

## 2016-06-08 ENCOUNTER — Encounter (INDEPENDENT_AMBULATORY_CARE_PROVIDER_SITE_OTHER): Payer: Self-pay

## 2016-06-08 ENCOUNTER — Encounter: Payer: Self-pay | Admitting: Internal Medicine

## 2016-06-08 VITALS — BP 142/76 | HR 53 | Ht 62.0 in | Wt 169.0 lb

## 2016-06-08 DIAGNOSIS — I480 Paroxysmal atrial fibrillation: Secondary | ICD-10-CM

## 2016-06-08 DIAGNOSIS — R001 Bradycardia, unspecified: Secondary | ICD-10-CM

## 2016-06-08 HISTORY — DX: Bradycardia, unspecified: R00.1

## 2016-06-08 MED ORDER — FLECAINIDE ACETATE 50 MG PO TABS: 50.0000 mg | ORAL_TABLET | Freq: Two times a day (BID) | ORAL | 3 refills | Status: DC

## 2016-06-08 NOTE — Patient Instructions (Signed)
Medication Instructions:  Your physician has recommended you make the following change in your medication:  Start flecainide 50 mg by mouth twice daily.    Labwork: none  Testing/Procedures: none  Follow-Up: Your physician recommends that you schedule a follow-up appointment in: 1 week for EKG and 1 month with Dr. Lovena Le    Any Other Special Instructions Will Be Listed Below (If Applicable).     If you need a refill on your cardiac medications before your next appointment, please call your pharmacy.

## 2016-06-08 NOTE — Progress Notes (Signed)
HPI [name] is referred today by Dr. Meda Coffee for evaluation of dizziness associated with frequent atrial and ventricular ectopy. She is a pleasant 83 yo woman who has had problems with dizziness for a couple of years. She states her mother was dizzy when she was older. She does not have CAD and has normal LV function with LAE. She does have some pulmonary HTN. The patient denies edema. She has fatigue and weakness. She wore a cardiac monitor which demonstrated sinus brady but no pauses and frequent atrial and ventricular ectopy. She has never passed out completely. Allergies  Allergen Reactions  . Amoxicillin Other (See Comments)    Very weak Has patient had a PCN reaction causing immediate rash, facial/tongue/throat swelling, SOB or lightheadedness with hypotension: no Has patient had a PCN reaction causing severe rash involving mucus membranes or skin necrosis: no Has patient had a PCN reaction that required hospitalization: no Has patient had a PCN reaction occurring within the last 10 years: no If all of the above answers are "NO", then may proceed with Cephalosporin use.   . Diltiazem Hcl Other (See Comments)    Reaction unknown  . Sulfonamide Derivatives Other (See Comments)    Reaction unknown  . Zetia [Ezetimibe] Other (See Comments)    Weakness     Current Outpatient Prescriptions  Medication Sig Dispense Refill  . acetaminophen (TYLENOL) 500 MG tablet Take 1,000 mg by mouth every 6 (six) hours as needed for mild pain, moderate pain or headache.    . albuterol (PROVENTIL HFA;VENTOLIN HFA) 108 (90 BASE) MCG/ACT inhaler Inhale 1 puff into the lungs every 6 (six) hours as needed for wheezing or shortness of breath. 1 Inhaler 3  . alendronate (FOSAMAX) 70 MG tablet Take 70 mg by mouth once a week.     Marland Kitchen allopurinol (ZYLOPRIM) 100 MG tablet Take 1 tablet (100 mg total) by mouth daily. 30 tablet 6  . aspirin EC 81 MG tablet Take 81 mg by mouth daily.    . cholecalciferol  (VITAMIN D) 1000 units tablet Take 1,000 Units by mouth daily.    . Ferrous Sulfate 28 MG TABS Take 28 mg by mouth daily.     Marland Kitchen losartan (COZAAR) 25 MG tablet Take 1 tablet (25 mg total) by mouth daily. 90 tablet 3  . montelukast (SINGULAIR) 10 MG tablet Take 10 mg by mouth at bedtime.      . Multiple Vitamins-Minerals (MULTIVITAMIN WITH MINERALS) tablet Take 1 tablet by mouth daily.    . Omega-3 Fatty Acids (FISH OIL BURP-LESS) 1200 MG CAPS Take 1 capsule by mouth daily.     . ondansetron (ZOFRAN) 4 MG tablet Take 4 mg by mouth every 8 (eight) hours as needed. For nausea    . pantoprazole (PROTONIX) 40 MG tablet Take 1 tablet (40 mg total) by mouth daily. 30 tablet 3  . pravastatin (PRAVACHOL) 20 MG tablet Take 20 mg by mouth daily.     . sertraline (ZOLOFT) 50 MG tablet Take 50 mg by mouth every evening.     . vitamin E 400 UNIT capsule Take 400 Units by mouth daily.     No current facility-administered medications for this visit.      Past Medical History:  Diagnosis Date  . Anemia    "I stay anemic"  . Arthritis   . Asthma    no problems recent  . Cataract   . Chronic diastolic CHF (congestive heart failure), NYHA class 2 ([place])  03/19/2014  . CKD (chronic kidney disease), stage III   . Depression   . Diverticulitis   . DVT (deep venous thrombosis) ([place])    from birth control, left groin  . Essential hypertension   . Fibromyalgia 10 y.a.  . GERD (gastroesophageal reflux disease)   . Gout   . Hx of cardiovascular stress test    Lexiscan Myoview (8/15): apical attenuation artifact, no ischemia, EF 69% - low risk.  Marland Kitchen Hypercholesteremia   . Odynophagia   . Other abnormal glucose   . PAF (paroxysmal atrial fibrillation) ([place])   . Personal history of other diseases of digestive system   . Pneumonia    hx of  . Sinus bradycardia   . Skin cancer 2014   skin R elbow & face    ROS:   All systems reviewed and negative except as noted in the HPI.   Past Surgical History:    Procedure Laterality Date  . ABDOMINAL HYSTERECTOMY     partial  . APPENDECTOMY  ~55years ago  . CHOLECYSTECTOMY  10/2005   Dr. Effie Shy  . COLONOSCOPY W/ BIOPSIES  2005, 2011   Dr. Bing Plume, "balloon inside for last one at [facility]"  . KNEE ARTHROSCOPY Right   . LAMINECTOMY  05/2007   foraminotomy, L4-5 laminectomy  . LARYNGOSCOPY / BRONCHOSCOPY / ESOPHAGOSCOPY  2010   Dr. Benjamine Mola  . SKIN CANCER EXCISION Right ~2005   r elbow  . TOTAL KNEE ARTHROPLASTY Left 04/04/2013   Procedure: LEFT TOTAL KNEE ARTHROPLASTY;  Surgeon: Tobi Bastos, MD;  Location: [facility];  Service: Orthopedics;  Laterality: Left;  . TUBAL LIGATION    . TUMOR REMOVAL Right ~ 20 years ago   tumor in between toes     Family History  Problem Relation Age of Onset  . Heart disease Mother   . Hypertension Mother   . Fainting Mother   . Arrhythmia Mother   . Diabetes Father   . Stroke Father   . Hypertension Father   . Cancer Brother   . Sudden death Brother   . Heart attack Brother   . Cancer Sister   . Diabetes Sister   . Asthma Sister      Social History   Social History  . Marital status: Widowed    Spouse name: N/A  . Number of children: N/A  . Years of education: N/A   Occupational History  . Not on file.   Social History Main Topics  . Smoking status: Former Smoker    Years: 10.00    Types: Cigarettes    Quit date: 02/08/1973  . Smokeless tobacco: Never Used  . Alcohol use No  . Drug use: No  . Sexual activity: No   Other Topics Concern  . Not on file   Social History Narrative  . No narrative on file     BP (!) 142/76 (BP Location: Right Arm)   Pulse (!) 53   Ht 5\' 2"  (1.575 m)   Wt 169 lb (76.7 kg)   BMI 30.91 kg/m   Physical Exam:  Well appearing elderly woman, NAD HEENT: Unremarkable Neck:  6 cm JVD, no thyromegally Lymphatics:  No adenopathy Back:  No CVA tenderness Lungs:  Clear with no wheezes HEART:  Regular rate rhythm, no murmurs, no rubs, no  clicks Abd:  soft, positive bowel sounds, no organomegally, no rebound, no guarding Ext:  2 plus pulses, no edema, no cyanosis, no clubbing Skin:  No rashes no  nodules Neuro:  CN II through XII intact, motor grossly intact  EKG -  Sinus brady with both PAC's and PVC's   Assess/Plan: 1. Dizziness - the etiology is unclear but there is no clear correlation with bradycardia or ectopy. I have offered her a trial of flecainide for suppression of her atrial and ventricular ectopy to see if this has any impact on her symptoms. 2. PVC's and PAC's - I have offered her the option of starting flecainide for arrhythmia suppression. If she has no improvement on low dose flecainide, I would plan to stop this medication. Will hold off on any stress testing until we see if the medication has any benefit. 3. HTN - her blood pressure today is a bit high but with her symptoms I would be disinclined to be more aggessive in tying to reduce her pressure further. She will continue her cozaar.  Mikle Bosworth.D.

## 2016-06-15 ENCOUNTER — Ambulatory Visit (INDEPENDENT_AMBULATORY_CARE_PROVIDER_SITE_OTHER): Payer: Medicare Other

## 2016-06-15 VITALS — BP 140/60 | HR 46 | Ht 62.0 in | Wt 168.0 lb

## 2016-06-15 DIAGNOSIS — I480 Paroxysmal atrial fibrillation: Secondary | ICD-10-CM

## 2016-06-15 DIAGNOSIS — Z79899 Other long term (current) drug therapy: Secondary | ICD-10-CM | POA: Diagnosis not present

## 2016-06-15 NOTE — Progress Notes (Signed)
1.) Reason for visit: EKG      2.) Name of MD requesting visit: Dr. Lovena Le  3.) H&P: Patient has a history of dizziness, HTN, and PAC's/PVC's. Patient is complaining of heart fluttering, weakness, and dizziness (that comes and goes). Patient was started on Flecainide at her last visit and she is getting an EKG today.  4.) ROS related to problem: Patient's vital signs, BP 140/60, HR 46 (EKG), and O2 sat is 94% on room air. Patient's EKG showed Sinus Bradycardia with left anterior fascicular block, septal infarct.   5.) Assessment and plan per MD: Per Dr. Lovena Le, will have patient keep follow up appointment on 07/07/16 to discuss pacemaker. Patient is to stop flecainide and seek medical attentiion if she passes out or has difficulty breathing. Patient verbalized understanding and will keep her appointment on 07/07/16.

## 2016-06-15 NOTE — Patient Instructions (Signed)
Medication Instructions:  Your physician recommends that you continue on your current medications as directed. Please refer to the Current Medication list given to you today.  Labwork: NONE  Testing/Procedures: NONE  Follow-Up: Your physician wants you to follow-up on 07/07/16 with Dr. Lovena Le.    If you need a refill on your cardiac medications before your next appointment, please call your pharmacy.   If you pass out or have hard time breathing please stop the Flecainide and call 911.

## 2016-06-28 ENCOUNTER — Telehealth: Payer: Self-pay | Admitting: Cardiology

## 2016-06-28 NOTE — Telephone Encounter (Signed)
New Message  Pt call requesting to speak with RN. Pt would like to know does she need to be seen by Dr. Meda Coffee before her appt with Dr. Lovena Le on 5/30. Please call back to discuss

## 2016-06-28 NOTE — Telephone Encounter (Signed)
Pt was calling to see if Dr Meda Coffee would still be her Primary Cardiologist, now that she has been referred to see Dr Lovena Le on next Wednesday 5/30.  Informed the pt that she will still have Dr Meda Coffee as her Primary Cardiologist, and Dr Lovena Le will be her Electrophysiologist.  Informed the pt that she will have 2 MDs that care for her from a cardiac standpoint. Educated the pt on the difference between each Cardiologist, and what each will do for her.  Pt verbalized understanding and agrees with this plan.

## 2016-06-29 ENCOUNTER — Telehealth: Payer: Self-pay | Admitting: Cardiology

## 2016-06-29 NOTE — Telephone Encounter (Signed)
Lauren a nurse from [employer], states that patient would like to participate in a heart failure program but Ander Purpura would like to verify the diagnosis. Thanks.

## 2016-06-30 ENCOUNTER — Telehealth: Payer: Self-pay | Admitting: Internal Medicine

## 2016-06-30 ENCOUNTER — Encounter (HOSPITAL_COMMUNITY): Payer: Self-pay

## 2016-06-30 ENCOUNTER — Inpatient Hospital Stay (HOSPITAL_COMMUNITY)
Admission: EM | Admit: 2016-06-30 | Discharge: 2016-07-02 | DRG: 243 | Disposition: A | Discharge status: Home / Self Care | Payer: Medicare Other | Attending: Internal Medicine | Admitting: Internal Medicine

## 2016-06-30 ENCOUNTER — Emergency Department (HOSPITAL_COMMUNITY): Payer: Medicare Other

## 2016-06-30 DIAGNOSIS — Z9109 Other allergy status, other than to drugs and biological substances: Secondary | ICD-10-CM | POA: Diagnosis not present

## 2016-06-30 DIAGNOSIS — R892 Abnormal level of other drugs, medicaments and biological substances in specimens from other organs, systems and tissues: Secondary | ICD-10-CM | POA: Diagnosis not present

## 2016-06-30 DIAGNOSIS — I495 Sick sinus syndrome: Secondary | ICD-10-CM | POA: Diagnosis not present

## 2016-06-30 DIAGNOSIS — I130 Hypertensive heart and chronic kidney disease with heart failure and stage 1 through stage 4 chronic kidney disease, or unspecified chronic kidney disease: Secondary | ICD-10-CM | POA: Diagnosis not present

## 2016-06-30 DIAGNOSIS — N183 Chronic kidney disease, stage 3 (moderate): Secondary | ICD-10-CM | POA: Diagnosis present

## 2016-06-30 DIAGNOSIS — Z950 Presence of cardiac pacemaker: Secondary | ICD-10-CM

## 2016-06-30 DIAGNOSIS — I5032 Chronic diastolic (congestive) heart failure: Secondary | ICD-10-CM | POA: Diagnosis present

## 2016-06-30 DIAGNOSIS — I480 Paroxysmal atrial fibrillation: Secondary | ICD-10-CM | POA: Diagnosis not present

## 2016-06-30 DIAGNOSIS — R001 Bradycardia, unspecified: Secondary | ICD-10-CM | POA: Diagnosis present

## 2016-06-30 DIAGNOSIS — K219 Gastro-esophageal reflux disease without esophagitis: Secondary | ICD-10-CM | POA: Diagnosis present

## 2016-06-30 DIAGNOSIS — Z880 Allergy status to penicillin: Secondary | ICD-10-CM

## 2016-06-30 DIAGNOSIS — Z79899 Other long term (current) drug therapy: Secondary | ICD-10-CM

## 2016-06-30 DIAGNOSIS — Z882 Allergy status to sulfonamides status: Secondary | ICD-10-CM | POA: Diagnosis not present

## 2016-06-30 DIAGNOSIS — Z7982 Long term (current) use of aspirin: Secondary | ICD-10-CM | POA: Diagnosis not present

## 2016-06-30 DIAGNOSIS — J9811 Atelectasis: Secondary | ICD-10-CM | POA: Diagnosis not present

## 2016-06-30 DIAGNOSIS — I700 Atherosclerosis of aorta: Secondary | ICD-10-CM | POA: Diagnosis not present

## 2016-06-30 HISTORY — DX: Bradycardia, unspecified: R00.1

## 2016-06-30 LAB — BASIC METABOLIC PANEL
ANION GAP: 6 (ref 5–15)
BUN: 22 mg/dL — ABNORMAL HIGH (ref 6–20)
CALCIUM: 9.1 mg/dL (ref 8.9–10.3)
CO2: 24 mmol/L (ref 22–32)
Chloride: 109 mmol/L (ref 101–111)
Creatinine, Ser: 1.26 mg/dL — ABNORMAL HIGH (ref 0.44–1.00)
GFR, EST AFRICAN AMERICAN: 43 mL/min — AB (ref >60)
GFR, EST NON AFRICAN AMERICAN: 37 mL/min — AB (ref >60)
Glucose, Bld: 98 mg/dL (ref 65–99)
Potassium: 4.4 mmol/L (ref 3.5–5.1)
Sodium: 139 mmol/L (ref 135–145)

## 2016-06-30 LAB — CBC
HCT: 31.2 % — ABNORMAL LOW (ref 36.0–46.0)
HEMOGLOBIN: 10 g/dL — AB (ref 12.0–15.0)
MCH: 31.5 pg (ref 26.0–34.0)
MCHC: 32.1 g/dL (ref 30.0–36.0)
MCV: 98.4 fL (ref 78.0–100.0)
Platelets: 149 10*3/uL — ABNORMAL LOW (ref 150–400)
RBC: 3.17 MIL/uL — AB (ref 3.87–5.11)
RDW: 14.1 % (ref 11.5–15.5)
WBC: 4.8 10*3/uL (ref 4.0–10.5)

## 2016-06-30 LAB — SURGICAL PCR SCREEN
MRSA, PCR: NEGATIVE
Staphylococcus aureus: POSITIVE — AB

## 2016-06-30 MED ORDER — PRAVASTATIN SODIUM 20 MG PO TABS
20.0000 mg | ORAL_TABLET | Freq: Every day | ORAL | Status: DC
  Administered 2016-06-30: 20 mg via ORAL
  Filled 2016-06-30 (×2): qty 1

## 2016-06-30 MED ORDER — LOSARTAN POTASSIUM 25 MG PO TABS
25.0000 mg | ORAL_TABLET | Freq: Every day | ORAL | Status: DC
  Administered 2016-07-01 – 2016-07-02 (×2): 25 mg via ORAL
  Filled 2016-06-30 (×2): qty 1

## 2016-06-30 MED ORDER — PANTOPRAZOLE SODIUM 40 MG PO TBEC
40.0000 mg | DELAYED_RELEASE_TABLET | Freq: Every day | ORAL | Status: DC
  Administered 2016-07-01 – 2016-07-02 (×2): 40 mg via ORAL
  Filled 2016-06-30 (×2): qty 1

## 2016-06-30 MED ORDER — CHLORHEXIDINE GLUCONATE 4 % EX LIQD
60.0000 mL | Freq: Once | CUTANEOUS | Status: AC
  Administered 2016-06-30: 4 via TOPICAL

## 2016-06-30 MED ORDER — VITAMIN D 1000 UNITS PO TABS
1000.0000 [IU] | ORAL_TABLET | Freq: Every day | ORAL | Status: DC
  Administered 2016-07-01 – 2016-07-02 (×2): 1000 [IU] via ORAL
  Filled 2016-06-30 (×2): qty 1

## 2016-06-30 MED ORDER — ONDANSETRON HCL 4 MG PO TABS: 4.0000 mg | ORAL_TABLET | Freq: Three times a day (TID) | ORAL | Status: DC | PRN

## 2016-06-30 MED ORDER — ALENDRONATE SODIUM 70 MG PO TABS: 70.0000 mg | ORAL_TABLET | ORAL | Status: DC

## 2016-06-30 MED ORDER — VANCOMYCIN HCL IN DEXTROSE 1-5 GM/200ML-% IV SOLN
1000.0000 mg | INTRAVENOUS | Status: AC
  Administered 2016-07-01: 1000 mg via INTRAVENOUS

## 2016-06-30 MED ORDER — ACETAMINOPHEN 500 MG PO TABS: 1000.0000 mg | ORAL_TABLET | Freq: Four times a day (QID) | ORAL | Status: DC | PRN

## 2016-06-30 MED ORDER — SODIUM CHLORIDE 0.9 % IV SOLN
INTRAVENOUS | Status: DC
  Administered 2016-07-01: 06:00:00 via INTRAVENOUS

## 2016-06-30 MED ORDER — SERTRALINE HCL 50 MG PO TABS
50.0000 mg | ORAL_TABLET | Freq: Every evening | ORAL | Status: DC
  Administered 2016-07-01: 50 mg via ORAL
  Filled 2016-06-30 (×2): qty 1

## 2016-06-30 MED ORDER — MONTELUKAST SODIUM 10 MG PO TABS
10.0000 mg | ORAL_TABLET | Freq: Every day | ORAL | Status: DC
  Administered 2016-06-30 – 2016-07-01 (×2): 10 mg via ORAL
  Filled 2016-06-30 (×2): qty 1

## 2016-06-30 MED ORDER — ASPIRIN EC 81 MG PO TBEC
81.0000 mg | DELAYED_RELEASE_TABLET | Freq: Every day | ORAL | Status: DC
  Administered 2016-07-01 – 2016-07-02 (×2): 81 mg via ORAL
  Filled 2016-06-30 (×2): qty 1

## 2016-06-30 MED ORDER — VITAMIN E 180 MG (400 UNIT) PO CAPS
400.0000 [IU] | ORAL_CAPSULE | Freq: Every day | ORAL | Status: DC
  Administered 2016-07-01 – 2016-07-02 (×2): 400 [IU] via ORAL
  Filled 2016-06-30 (×3): qty 1

## 2016-06-30 MED ORDER — ALLOPURINOL 100 MG PO TABS
100.0000 mg | ORAL_TABLET | Freq: Every day | ORAL | Status: DC
  Administered 2016-07-01 – 2016-07-02 (×2): 100 mg via ORAL
  Filled 2016-06-30 (×2): qty 1

## 2016-06-30 MED ORDER — ATROPINE SULFATE 1 MG/ML IJ SOLN
1.0000 mg | Freq: Once | INTRAMUSCULAR | Status: AC
  Administered 2016-06-30: 1 mg via INTRAVENOUS
  Filled 2016-06-30: qty 1

## 2016-06-30 MED ORDER — CHLORHEXIDINE GLUCONATE 4 % EX LIQD
60.0000 mL | Freq: Once | CUTANEOUS | Status: AC
  Administered 2016-07-01: 4 via TOPICAL
  Filled 2016-06-30: qty 15

## 2016-06-30 MED ORDER — ALBUTEROL SULFATE (2.5 MG/3ML) 0.083% IN NEBU: 2.5000 mg | INHALATION_SOLUTION | Freq: Four times a day (QID) | RESPIRATORY_TRACT | Status: DC | PRN

## 2016-06-30 MED ORDER — SODIUM CHLORIDE 0.9 % IR SOLN
80.0000 mg | Status: AC
  Administered 2016-07-01: 80 mg
  Filled 2016-06-30: qty 2

## 2016-06-30 NOTE — Telephone Encounter (Signed)
Left VM with [employer] representative made her aware we need a EF% form faxed over so records can be sent to her. Made phone and fax available for Lauren.

## 2016-06-30 NOTE — Telephone Encounter (Signed)
Cannot verify CHF. Made her aware to fax over EF% Form.

## 2016-06-30 NOTE — Telephone Encounter (Signed)
Advised patient that Dr. Lovena Le recommends she goes to [facility] ED for evaluation of her symptomatic bradycardia. Patient verbalized understanding and agreement and states she has her son at home with her who can drive her. She thanked me for the call.

## 2016-06-30 NOTE — H&P (Signed)
ELECTROPHYSIOLOGY CONSULT NOTE    Patient ID: MYREL RAPPLEYE MRN: [id number], DOB/AGE: February 03, 1928 83 y.o.  Admit date: 06/30/2016 Date of Consult: 06/30/2016  Primary Physician: Eloise Levels, NP Primary Cardiologist: Meda Coffee Electrophysiologist: Lovena Le  CC: bradycardia and dizziness  HPI:  [name] is a 83 y.o. female is referred by Dr Roderic Palau for evaluation of bradycardia.  Past medical history is significant for hypertension, GERD, CKD, and frequent atrial ectopy.  She was recently seen by Dr Lovena Le in the office and it was felt at that time that her bradycardia was 2/2 frequent ectopy. She was started on low dose Flecainide. Since the weekend, she reports significant weakness and dizziness corresponding with heart rates in the 30's and 40's on her home monitor.  She presented to the ER for further evaluation and was found to be in sinus bradycardia in the 40's. She was given atropine with improvement in heart rates and symptoms. EP has been asked to evaluate for treatment options.    Last echo 08/2013 demonstrated EF 00-93%, grade 1 diastolic dysfunction.   She currently denies chest pain, shortness of breath, recent fevers, chills, nausea or vomiting. She has not had frank syncope.   Past Medical History:  Diagnosis Date  . Anemia    "I stay anemic"  . Arthritis   . Asthma    no problems recent  . Cataract   . Chronic diastolic CHF (congestive heart failure), NYHA class 2 ([place]) 03/19/2014  . CKD (chronic kidney disease), stage III   . Depression   . Diverticulitis   . DVT (deep venous thrombosis) ([place])    from birth control, left groin  . Essential hypertension   . Fibromyalgia 10 y.a.  . GERD (gastroesophageal reflux disease)   . Gout   . Hx of cardiovascular stress test    Lexiscan Myoview (8/15): apical attenuation artifact, no ischemia, EF 69% - low risk.  Marland Kitchen Hypercholesteremia   . Odynophagia   . Other abnormal glucose   . PAF (paroxysmal atrial  fibrillation) ([place])   . Personal history of other diseases of digestive system   . Pneumonia    hx of  . Sinus bradycardia   . Skin cancer 2014   skin R elbow & face     Surgical History:  Past Surgical History:  Procedure Laterality Date  . ABDOMINAL HYSTERECTOMY     partial  . APPENDECTOMY  ~55years ago  . CHOLECYSTECTOMY  10/2005   Dr. Effie Shy  . COLONOSCOPY W/ BIOPSIES  2005, 2011   Dr. Bing Plume, "balloon inside for last one at [facility]"  . KNEE ARTHROSCOPY Right   . LAMINECTOMY  05/2007   foraminotomy, L4-5 laminectomy  . LARYNGOSCOPY / BRONCHOSCOPY / ESOPHAGOSCOPY  2010   Dr. Benjamine Mola  . SKIN CANCER EXCISION Right ~2005   r elbow  . TOTAL KNEE ARTHROPLASTY Left 04/04/2013   Procedure: LEFT TOTAL KNEE ARTHROPLASTY;  Surgeon: Tobi Bastos, MD;  Location: [facility];  Service: Orthopedics;  Laterality: Left;  . TUBAL LIGATION    . TUMOR REMOVAL Right ~ 20 years ago   tumor in between toes    No current facility-administered medications for this encounter.   Current Outpatient Prescriptions:  .  acetaminophen (TYLENOL) 500 MG tablet, Take 1,000 mg by mouth every 6 (six) hours as needed for mild pain, moderate pain or headache., Disp: , Rfl:  .  albuterol (PROVENTIL HFA;VENTOLIN HFA) 108 (90 BASE) MCG/ACT inhaler, Inhale 1 puff into the lungs every  6 (six) hours as needed for wheezing or shortness of breath., Disp: 1 Inhaler, Rfl: 3 .  alendronate (FOSAMAX) 70 MG tablet, Take 70 mg by mouth once a week. , Disp: , Rfl:  .  allopurinol (ZYLOPRIM) 100 MG tablet, Take 1 tablet (100 mg total) by mouth daily., Disp: 30 tablet, Rfl: 6 .  aspirin EC 81 MG tablet, Take 81 mg by mouth daily., Disp: , Rfl:  .  cholecalciferol (VITAMIN D) 1000 units tablet, Take 1,000 Units by mouth daily., Disp: , Rfl:  .  Ferrous Sulfate 28 MG TABS, Take 28 mg by mouth daily. , Disp: , Rfl:  .  flecainide (TAMBOCOR) 50 MG tablet, Take 1 tablet (50 mg total) by mouth 2 (two) times daily., Disp: 60  tablet, Rfl: 3 .  losartan (COZAAR) 25 MG tablet, Take 1 tablet (25 mg total) by mouth daily., Disp: 90 tablet, Rfl: 3 .  montelukast (SINGULAIR) 10 MG tablet, Take 10 mg by mouth at bedtime.  , Disp: , Rfl:  .  Multiple Vitamins-Minerals (MULTIVITAMIN WITH MINERALS) tablet, Take 1 tablet by mouth daily., Disp: , Rfl:  .  Omega-3 Fatty Acids (FISH OIL BURP-LESS) 1200 MG CAPS, Take 1 capsule by mouth daily. , Disp: , Rfl:  .  ondansetron (ZOFRAN) 4 MG tablet, Take 4 mg by mouth every 8 (eight) hours as needed. For nausea, Disp: , Rfl:  .  pantoprazole (PROTONIX) 40 MG tablet, Take 1 tablet (40 mg total) by mouth daily., Disp: 30 tablet, Rfl: 3 .  pravastatin (PRAVACHOL) 20 MG tablet, Take 20 mg by mouth daily. , Disp: , Rfl:  .  sertraline (ZOLOFT) 50 MG tablet, Take 50 mg by mouth every evening. , Disp: , Rfl:  .  vitamin E 400 UNIT capsule, Take 400 Units by mouth daily., Disp: , Rfl:   Inpatient Medications:   Allergies:  Allergies  Allergen Reactions  . Amoxicillin Other (See Comments)    Very weak Has patient had a PCN reaction causing immediate rash, facial/tongue/throat swelling, SOB or lightheadedness with hypotension: no Has patient had a PCN reaction causing severe rash involving mucus membranes or skin necrosis: no Has patient had a PCN reaction that required hospitalization: no Has patient had a PCN reaction occurring within the last 10 years: no If all of the above answers are "NO", then may proceed with Cephalosporin use.   . Diltiazem Hcl Other (See Comments)    Reaction unknown  . Sulfonamide Derivatives Other (See Comments)    Reaction unknown  . Zetia [Ezetimibe] Other (See Comments)    Weakness    Social History   Social History  . Marital status: Widowed    Spouse name: N/A  . Number of children: N/A  . Years of education: N/A   Occupational History  . Not on file.   Social History Main Topics  . Smoking status: Former Smoker    Years: 10.00    Types:  Cigarettes    Quit date: 02/08/1973  . Smokeless tobacco: Never Used  . Alcohol use No  . Drug use: No  . Sexual activity: No   Other Topics Concern  . Not on file   Social History Narrative  . No narrative on file     Family History  Problem Relation Age of Onset  . Heart disease Mother   . Hypertension Mother   . Fainting Mother   . Arrhythmia Mother   . Diabetes Father   . Stroke Father   .  Hypertension Father   . Cancer Brother   . Sudden death Brother   . Heart attack Brother   . Cancer Sister   . Diabetes Sister   . Asthma Sister      Review of Systems: All other systems reviewed and are otherwise negative except as noted above.  Physical Exam: Vitals:   06/30/16 1127 06/30/16 1133 06/30/16 1215  BP: (!) 172/58  (!) 157/66  Pulse: (!) 40  (!) 36  Resp: 19  16  Temp: 97.9 F (36.6 C)    TempSrc: Oral    SpO2: 95%  98%  Weight:  168 lb (76.2 kg)   Height:  5\' 2"  (1.575 m)     GEN- The patient is elderly appearing, alert and oriented x 3 today.   HEENT: normocephalic, atraumatic; sclera clear, conjunctiva pink; hearing intact; oropharynx clear; neck supple  Lungs- Clear to ausculation bilaterally, normal work of breathing.  No wheezes, rales, rhonchi Heart- Bradycardic regular rate and rhythm  GI- soft, non-tender, non-distended, bowel sounds present  Extremities- no clubbing, cyanosis, or edema  MS- no significant deformity or atrophy Skin- warm and dry, no rash or lesion Psych- euthymic mood, full affect Neuro- strength and sensation are intact  Labs:  Lab Results  Component Value Date   WBC 4.8 06/30/2016   HGB 10.0 (L) 06/30/2016   HCT 31.2 (L) 06/30/2016   MCV 98.4 06/30/2016   PLT 149 (L) 06/30/2016    Recent Labs Lab 06/30/16 1136  NA 139  K 4.4  CL 109  CO2 24  BUN 22*  CREATININE 1.26*  CALCIUM 9.1  GLUCOSE 98      Radiology/Studies: Dg Chest Port 1 View Result Date: 06/30/2016 CLINICAL DATA:  83 year old female with a  history of weakness and dizziness EXAM: PORTABLE CHEST 1 VIEW COMPARISON:  06/28/2015, 09/05/2013, 04/02/2013 FINDINGS: Cardiomediastinal silhouette similar to the prior. Fullness in the bilateral hilar regions. Calcifications of the aortic arch. No pneumothorax. No pleural effusion. Linear opacities at the bilateral lung bases unchanged. No evidence of interlobular septal thickening. No displaced fracture. IMPRESSION: Chronic lung changes without evidence of acute cardiopulmonary disease. Aortic atherosclerosis Electronically Signed   By: Corrie Mckusick D.O.   On: 06/30/2016 12:42    [facility]:SLHTD brady, rate 42 (personally reviewed)  TELEMETRY: sinus rhythm, sinus brady (personally reviewed)   Assessment/Plan: 1.  Symptomatic sinus bradycardia The patient has symptomatic sinus bradycardia.  I discussed case with Dr Lovena Le today. In the setting of persistent symptoms and sinus bradycardia, pacemaker placement is indicated at this time. Risks, benefits reviewed with patient and family who wish to proceed. Will plan with Dr Lovena Le tomorrow.  NPO after midnight tonight.   2.  Frequent PVC's and PAC's Suppression of ectopy did not help dizziness She has persistent sinus bradycardia with symptoms despite low dose Flecainide Stop Flecainide and will follow burden through device  Dr. Caryl Comes to see later today.  Signed, Chanetta Marshall, NP 06/30/2016 1:24 PM  Patient seen and examined. Reviewing the monitor prior to the use of flecainide average heart rates were in the high 50s and 60s Monitor 2 weeks 2/18)>>>> (.Holter 2016  Minimum: 45 bpm Mean: 64 bpm mV Maximum: 91 bpm)  In patients with sinus node dysfunction-- and this demonstrable In her for years flecainide can aggravate sinus node dysfunction and I wonder whether it isn't the culprit. As such, it may be possible to avoid pacing if with washout of the drug sinus rates improved. I have reviewed this  with Dr. Lovena Le and he will follow in the  AM

## 2016-06-30 NOTE — Telephone Encounter (Signed)
F/U Call:  Lauren with [employer] back, states that she would really like to speak with you in regards to her previous call.Thanks.

## 2016-06-30 NOTE — Progress Notes (Signed)
RN Colletta Maryland spoke with cath lab regarding PPM placement today vs. Tomorrow. Cath lab said it would be tomorrow at 1:30 - ok for patient to eat.

## 2016-06-30 NOTE — ED Triage Notes (Signed)
Per Pt, Pt is coming from home with dizziness and weakness x 2 days. Pt called PCP and was told to come here. Pt was noted to have HR of 40 upon arrival. Pt is known to be bradycardic by PCP, but she has been taking medication to increase her HR.

## 2016-06-30 NOTE — Telephone Encounter (Signed)
New message  Pt states pulse was 29 when she woke up, then it raised to 39 and when she took her medicine it was 40. She states now it is 75. She states that Dr. Lovena Le told her to call us this morning with the information and that he told her if the pulse was under 30 then she needed to go to the ER. She requests a call back to see what she should do.

## 2016-06-30 NOTE — ED Provider Notes (Signed)
[facility] DEPT Provider Note   CSN: [id number] Arrival date & time: 06/30/16  1123     History   Chief Complaint Chief Complaint  Patient presents with  . Bradycardia    HPI [name] is a 83 y.o. female.  Patient complains of weakness. She states this has become worse over last few days   The history is provided by the patient. No language interpreter was used.  Weakness  Primary symptoms include no focal weakness. This is a new problem. The current episode started more than 2 days ago. The problem has not changed since onset.There was no focality noted. There has been no fever. Pertinent negatives include no shortness of breath, no chest pain and no headaches. There were no medications administered prior to arrival. Associated medical issues do not include trauma.    Past Medical History:  Diagnosis Date  . Anemia    "I stay anemic"  . Arthritis   . Asthma    no problems recent  . Cataract   . Chronic diastolic CHF (congestive heart failure), NYHA class 2 ([place]) 03/19/2014  . CKD (chronic kidney disease), stage III   . Depression   . Diverticulitis   . DVT (deep venous thrombosis) ([place])    from birth control, left groin  . Essential hypertension   . Fibromyalgia 10 y.a.  . GERD (gastroesophageal reflux disease)   . Gout   . Hx of cardiovascular stress test    Lexiscan Myoview (8/15): apical attenuation artifact, no ischemia, EF 69% - low risk.  Marland Kitchen Hypercholesteremia   . Odynophagia   . Other abnormal glucose   . PAF (paroxysmal atrial fibrillation) ([place])   . Personal history of other diseases of digestive system   . Pneumonia    hx of  . Sinus bradycardia   . Skin cancer 2014   skin R elbow & face    Patient Active Problem List   Diagnosis Date Noted  . Symptomatic bradycardia 06/30/2016  . Hypertensive heart disease 04/01/2016  . Bradycardia 04/01/2016  . Fall 04/01/2016  . Sinus bradycardia   . CKD (chronic kidney disease), stage III   .  Anemia   . Essential hypertension   . PAF (paroxysmal atrial fibrillation) ([place])   . Palpitations 09/18/2014  . DOE (dyspnea on exertion) 03/19/2014  . Chronic diastolic CHF (congestive heart failure), NYHA class 2 ([place]) 03/19/2014  . Fatigue 09/11/2013  . Gastroenteritis 07/20/2013  . Abdominal pain 07/20/2013  . Abnormal x-ray of abdomen 07/20/2013  . Diarrhea 07/20/2013  . Generalized weakness 07/20/2013  . Obesity (BMI 30-39.9) 07/20/2013  . Hypercholesteremia   . GERD (gastroesophageal reflux disease)   . Depression   . Asthma   . Gout   . Acute blood loss anemia 04/05/2013  . Osteoarthritis of left knee 04/04/2013  . Total knee replacement status 04/04/2013  . Pulmonary hypertension ([place]) 03/07/2013  . Anemia, unspecified 03/19/2012  . Unspecified deficiency anemia 03/19/2012  . Dizziness 07/09/2010  . CHEST PAIN UNSPECIFIED 07/30/2009  . HYPERTENSION, UNSPECIFIED 08/19/2008  . PAROXYSMAL ATRIAL FIBRILLATION 08/19/2008  . GASTROESOPHAGEAL REFLUX DISEASE, HX OF 08/19/2008    Past Surgical History:  Procedure Laterality Date  . ABDOMINAL HYSTERECTOMY     partial  . APPENDECTOMY  ~55years ago  . CHOLECYSTECTOMY  10/2005   Dr. Effie Shy  . COLONOSCOPY W/ BIOPSIES  2005, 2011   Dr. Bing Plume, "balloon inside for last one at [facility]"  . KNEE ARTHROSCOPY Right   . LAMINECTOMY  05/2007   foraminotomy, L4-5 laminectomy  . LARYNGOSCOPY / BRONCHOSCOPY / ESOPHAGOSCOPY  2010   Dr. Benjamine Mola  . SKIN CANCER EXCISION Right ~2005   r elbow  . TOTAL KNEE ARTHROPLASTY Left 04/04/2013   Procedure: LEFT TOTAL KNEE ARTHROPLASTY;  Surgeon: Tobi Bastos, MD;  Location: [facility];  Service: Orthopedics;  Laterality: Left;  . TUBAL LIGATION    . TUMOR REMOVAL Right ~ 20 years ago   tumor in between toes    OB History    No data available       Home Medications    Prior to Admission medications   Medication Sig Start Date End Date Taking? Authorizing Provider    acetaminophen (TYLENOL) 500 MG tablet Take 1,000 mg by mouth every 6 (six) hours as needed for mild pain, moderate pain or headache.    [provider]  albuterol (PROVENTIL HFA;VENTOLIN HFA) 108 (90 BASE) MCG/ACT inhaler Inhale 1 puff into the lungs every 6 (six) hours as needed for wheezing or shortness of breath. 11/13/14   Dorothy Spark, MD  alendronate (FOSAMAX) 70 MG tablet Take 70 mg by mouth once a week.  02/13/14   [provider]  allopurinol (ZYLOPRIM) 100 MG tablet Take 1 tablet (100 mg total) by mouth daily. 04/01/16   Veatrice Bourbon, MD  aspirin EC 81 MG tablet Take 81 mg by mouth daily.    [provider]  cholecalciferol (VITAMIN D) 1000 units tablet Take 1,000 Units by mouth daily.    [provider]  Ferrous Sulfate 28 MG TABS Take 28 mg by mouth daily.     [provider]  flecainide (TAMBOCOR) 50 MG tablet Take 1 tablet (50 mg total) by mouth 2 (two) times daily. 06/08/16   Evans Lance, MD  losartan (COZAAR) 25 MG tablet Take 1 tablet (25 mg total) by mouth daily. 04/01/16 06/30/16  Dorothy Spark, MD  montelukast (SINGULAIR) 10 MG tablet Take 10 mg by mouth at bedtime.      [provider]  Multiple Vitamins-Minerals (MULTIVITAMIN WITH MINERALS) tablet Take 1 tablet by mouth daily.    [provider]  Omega-3 Fatty Acids (FISH OIL BURP-LESS) 1200 MG CAPS Take 1 capsule by mouth daily.     [provider]  ondansetron (ZOFRAN) 4 MG tablet Take 4 mg by mouth every 8 (eight) hours as needed. For nausea 05/21/13   [provider]  pantoprazole (PROTONIX) 40 MG tablet Take 1 tablet (40 mg total) by mouth daily. 04/15/16   Haney, Yetta Flock A, MD  pravastatin (PRAVACHOL) 20 MG tablet Take 20 mg by mouth daily.  09/17/14   [provider]  sertraline (ZOLOFT) 50 MG tablet Take 50 mg by mouth every evening.  03/15/12   [provider]  vitamin E 400 UNIT capsule Take 400 Units by mouth daily.     [provider]    Family History Family History  Problem Relation Age of Onset  . Heart disease Mother   . Hypertension Mother   . Fainting Mother   . Arrhythmia Mother   . Diabetes Father   . Stroke Father   . Hypertension Father   . Cancer Brother   . Sudden death Brother   . Heart attack Brother   . Cancer Sister   . Diabetes Sister   . Asthma Sister     Social History Social History  Substance Use Topics  . Smoking status: Former Smoker  Years: 10.00    Types: Cigarettes    Quit date: 02/08/1973  . Smokeless tobacco: Never Used  . Alcohol use No     Allergies   Amoxicillin; Diltiazem hcl; Sulfonamide derivatives; and Zetia [ezetimibe]   Review of Systems Review of Systems  Constitutional: Negative for appetite change and fatigue.  HENT: Negative for congestion, ear discharge and sinus pressure.   Eyes: Negative for discharge.  Respiratory: Negative for cough and shortness of breath.   Cardiovascular: Negative for chest pain.  Gastrointestinal: Negative for abdominal pain and diarrhea.  Genitourinary: Negative for frequency and hematuria.  Musculoskeletal: Negative for back pain.  Skin: Negative for rash.  Neurological: Positive for weakness. Negative for focal weakness, seizures and headaches.  Psychiatric/Behavioral: Negative for hallucinations.     Physical Exam Updated Vital Signs BP 116/74   Pulse (!) 44   Temp 97.9 F (36.6 C) (Oral)   Resp 12   Ht 5\' 2"  (1.575 m)   Wt 76.2 kg (168 lb)   SpO2 99%   BMI 30.73 kg/m   Physical Exam  Constitutional: She is oriented to person, place, and time. She appears well-developed.  HENT:  Head: Normocephalic.  Eyes: Conjunctivae and EOM are normal. No scleral icterus.  Neck: Neck supple. No thyromegaly present.  Cardiovascular: Regular rhythm.  Exam reveals no gallop and no friction rub.   No murmur heard. Brady cardia,  Rate 30  Pulmonary/Chest: No stridor. She has no wheezes. She has  no rales. She exhibits no tenderness.  Abdominal: She exhibits no distension. There is no tenderness. There is no rebound.  Musculoskeletal: Normal range of motion. She exhibits no edema.  Lymphadenopathy:    She has no cervical adenopathy.  Neurological: She is oriented to person, place, and time. She exhibits normal muscle tone. Coordination normal.  Skin: No rash noted. No erythema.  Psychiatric: She has a normal mood and affect. Her behavior is normal.     ED Treatments / Results  Labs (all labs ordered are listed, but only abnormal results are displayed) Labs Reviewed  BASIC METABOLIC PANEL - Abnormal; Notable for the following:       Result Value   BUN 22 (*)    Creatinine, Ser 1.26 (*)    GFR calc non Af Amer 37 (*)    GFR calc Af Amer 43 (*)    All other components within normal limits  CBC - Abnormal; Notable for the following:    RBC 3.17 (*)    Hemoglobin 10.0 (*)    HCT 31.2 (*)    Platelets 149 (*)    All other components within normal limits    EKG  EKG Interpretation  Date/Time:  Wednesday Jun 30 2016 11:29:06 EDT Ventricular Rate:  42 PR Interval:  144 QRS Duration: 102 QT Interval:  500 QTC Calculation: 417 R Axis:   -58 Text Interpretation:  Marked sinus bradycardia Pulmonary disease pattern Left anterior fascicular block Septal infarct , age undetermined Abnormal ECG Confirmed by Shadeed Colberg  MD, Broadus John [phone]) on 06/30/2016 12:05:36 PM Also confirmed by Unique Searfoss  MD, Broadus John [phone])  on 06/30/2016 12:25:31 PM       Radiology Dg Chest Port 1 View  Result Date: 06/30/2016 CLINICAL DATA:  83 year old female with a history of weakness and dizziness EXAM: PORTABLE CHEST 1 VIEW COMPARISON:  06/28/2015, 09/05/2013, 04/02/2013 FINDINGS: Cardiomediastinal silhouette similar to the prior. Fullness in the bilateral hilar regions. Calcifications of the aortic arch. No pneumothorax. No pleural effusion. Linear opacities at  the bilateral lung bases unchanged. No evidence  of interlobular septal thickening. No displaced fracture. IMPRESSION: Chronic lung changes without evidence of acute cardiopulmonary disease. Aortic atherosclerosis Electronically Signed   By: Corrie Mckusick D.O.   On: 06/30/2016 12:42    Procedures Procedures (including critical care time)  Medications Ordered in ED Medications  atropine injection 1 mg (1 mg Intravenous Given 06/30/16 1228)     Initial Impression / Assessment and Plan / ED Course  I have reviewed the triage vital signs and the nursing notes.  Pertinent labs & imaging results that were available during my care of the patient were reviewed by me and considered in my medical decision making (see chart for details).    CRITICAL CARE Performed by: Dick Hark L Total critical care time: 40 minutes Critical care time was exclusive of separately billable procedures and treating other patients. Critical care was necessary to treat or prevent imminent or life-threatening deterioration. Critical care was time spent personally by me on the following activities: development of treatment plan with patient and/or surrogate as well as nursing, discussions with consultants, evaluation of patient's response to treatment, examination of patient, obtaining history from patient or surrogate, ordering and performing treatments and interventions, ordering and review of laboratory studies, ordering and review of radiographic studies, pulse oximetry and re-evaluation of patient's condition.   Patient with symptomatic bradycardia. She was given atropine 1 mg and her rate went from 30-60. Patient has been seen by cardiology they will admit her for the bradycardia  Final Clinical Impressions(s) / ED Diagnoses   Final diagnoses:  Bradycardia    New Prescriptions New Prescriptions   No medications on file     [name] Ferguson, MD 06/30/16 1435

## 2016-06-30 NOTE — Telephone Encounter (Signed)
Spoke with patient who states pulse was 39 bpm this morning when she woke up. She states she got up and took her medications and pulse was 40 bpm. She states it is 43 bpm currently. She c/o dizziness and weakness, states she has not passed out and does not feel like she is going to pass out. She c/o aching in chest last night, none today; denies SOB, n/v. She states she took all of her prescribed medications. I advised that I will review with Dr. Lovena Le and call her back with his advice. She verbalized understanding and agreement and thanked me for the call.

## 2016-07-01 ENCOUNTER — Encounter (HOSPITAL_COMMUNITY): Payer: Self-pay | Admitting: Internal Medicine

## 2016-07-01 ENCOUNTER — Encounter (HOSPITAL_COMMUNITY)
Admission: EM | Disposition: A | Discharge status: Home / Self Care | Payer: Self-pay | Source: Home / Self Care | Attending: Internal Medicine

## 2016-07-01 DIAGNOSIS — I495 Sick sinus syndrome: Principal | ICD-10-CM

## 2016-07-01 HISTORY — PX: PACEMAKER IMPLANT: EP1218

## 2016-07-01 LAB — CBC WITH DIFFERENTIAL/PLATELET
BASOS PCT: 0 %
Basophils Absolute: 0 10*3/uL (ref 0.0–0.1)
EOS ABS: 0.1 10*3/uL (ref 0.0–0.7)
Eosinophils Relative: 3 %
HEMATOCRIT: 29.9 % — AB (ref 36.0–46.0)
HEMOGLOBIN: 9.5 g/dL — AB (ref 12.0–15.0)
LYMPHS ABS: 1.9 10*3/uL (ref 0.7–4.0)
Lymphocytes Relative: 36 %
MCH: 31.5 pg (ref 26.0–34.0)
MCHC: 31.8 g/dL (ref 30.0–36.0)
MCV: 99 fL (ref 78.0–100.0)
Monocytes Absolute: 0.3 10*3/uL (ref 0.1–1.0)
Monocytes Relative: 6 %
NEUTROS ABS: 2.9 10*3/uL (ref 1.7–7.7)
NEUTROS PCT: 55 %
Platelets: 145 10*3/uL — ABNORMAL LOW (ref 150–400)
RBC: 3.02 MIL/uL — AB (ref 3.87–5.11)
RDW: 14.4 % (ref 11.5–15.5)
WBC: 5.2 10*3/uL (ref 4.0–10.5)

## 2016-07-01 LAB — BASIC METABOLIC PANEL
Anion gap: 8 (ref 5–15)
BUN: 20 mg/dL (ref 6–20)
CHLORIDE: 108 mmol/L (ref 101–111)
CO2: 24 mmol/L (ref 22–32)
Calcium: 8.9 mg/dL (ref 8.9–10.3)
Creatinine, Ser: 1.31 mg/dL — ABNORMAL HIGH (ref 0.44–1.00)
GFR calc non Af Amer: 35 mL/min — ABNORMAL LOW (ref >60)
GFR, EST AFRICAN AMERICAN: 41 mL/min — AB (ref >60)
Glucose, Bld: 100 mg/dL — ABNORMAL HIGH (ref 65–99)
POTASSIUM: 4.3 mmol/L (ref 3.5–5.1)
Sodium: 140 mmol/L (ref 135–145)

## 2016-07-01 SURGERY — PACEMAKER IMPLANT

## 2016-07-01 MED ORDER — MIDAZOLAM HCL 5 MG/5ML IJ SOLN
INTRAMUSCULAR | Status: DC | PRN
  Administered 2016-07-01 (×3): 1 mg via INTRAVENOUS

## 2016-07-01 MED ORDER — FENTANYL CITRATE (PF) 100 MCG/2ML IJ SOLN
INTRAMUSCULAR | Status: DC | PRN
  Administered 2016-07-01 (×2): 12.5 ug via INTRAVENOUS

## 2016-07-01 MED ORDER — LIDOCAINE HCL (PF) 1 % IJ SOLN
INTRAMUSCULAR | Status: AC
  Filled 2016-07-01: qty 30

## 2016-07-01 MED ORDER — HEPARIN (PORCINE) IN NACL 2-0.9 UNIT/ML-% IJ SOLN
INTRAMUSCULAR | Status: AC
  Filled 2016-07-01: qty 500

## 2016-07-01 MED ORDER — FENTANYL CITRATE (PF) 100 MCG/2ML IJ SOLN
INTRAMUSCULAR | Status: AC
  Filled 2016-07-01: qty 2

## 2016-07-01 MED ORDER — SODIUM CHLORIDE 0.9 % IR SOLN
Status: AC
  Filled 2016-07-01: qty 2

## 2016-07-01 MED ORDER — VANCOMYCIN HCL IN DEXTROSE 1-5 GM/200ML-% IV SOLN
INTRAVENOUS | Status: AC
  Filled 2016-07-01: qty 200

## 2016-07-01 MED ORDER — ACETAMINOPHEN 325 MG PO TABS: 325.0000 mg | ORAL_TABLET | ORAL | Status: DC | PRN

## 2016-07-01 MED ORDER — MIDAZOLAM HCL 5 MG/5ML IJ SOLN
INTRAMUSCULAR | Status: AC
  Filled 2016-07-01: qty 5

## 2016-07-01 MED ORDER — LIDOCAINE HCL (PF) 1 % IJ SOLN
INTRAMUSCULAR | Status: DC | PRN
  Administered 2016-07-01: 60 mL

## 2016-07-01 MED ORDER — ACETAMINOPHEN 325 MG PO TABS
325.0000 mg | ORAL_TABLET | ORAL | Status: DC | PRN
  Administered 2016-07-02: 650 mg via ORAL
  Filled 2016-07-01: qty 2

## 2016-07-01 MED ORDER — ONDANSETRON HCL 4 MG/2ML IJ SOLN: 4.0000 mg | Freq: Four times a day (QID) | INTRAMUSCULAR | Status: DC | PRN

## 2016-07-01 MED ORDER — VANCOMYCIN HCL IN DEXTROSE 1-5 GM/200ML-% IV SOLN
1000.0000 mg | Freq: Two times a day (BID) | INTRAVENOUS | Status: AC
  Administered 2016-07-02: 1000 mg via INTRAVENOUS
  Filled 2016-07-01: qty 200

## 2016-07-01 MED ORDER — HEPARIN (PORCINE) IN NACL 2-0.9 UNIT/ML-% IJ SOLN
INTRAMUSCULAR | Status: AC | PRN
  Administered 2016-07-01: 500 mL

## 2016-07-01 MED ORDER — CHLORHEXIDINE GLUCONATE 4 % EX LIQD
CUTANEOUS | Status: AC
  Administered 2016-07-01: 4 via TOPICAL
  Filled 2016-07-01: qty 15

## 2016-07-01 SURGICAL SUPPLY — 9 items
CABLE SURGICAL S-101-97-12 (CABLE) ×1 IMPLANT
INGEVITY MRI 7740-45CM (Lead) ×2 IMPLANT
INGEVITY MRI 7741-52CM (Lead) ×2 IMPLANT
LEAD PACING INGEVITY MRI 45CM (Lead) IMPLANT
LEAD PACING INGEVITY MRI 52CM (Lead) IMPLANT
PACEMAKER ACCOLADE GR (Pacemaker) ×1 IMPLANT
PAD DEFIB LIFELINK (PAD) ×1 IMPLANT
SHEATH CLASSIC 7F (SHEATH) ×2 IMPLANT
TRAY PACEMAKER INSERTION (PACKS) ×1 IMPLANT

## 2016-07-01 NOTE — Plan of Care (Signed)
Problem: Safety: Goal: Ability to remain free from injury will improve Outcome: Progressing Fall risk bundle in place. No injuries or skin breakdown this shift. Will continue to monitor and perform hourly rounding.  Problem: Physical Regulation: Goal: Ability to maintain clinical measurements within normal limits will improve Outcome: Not Progressing Pt HR remained in the 30-40's throughout the night. No complaints of cp. Pt remains NPO in the event she gets a pacemaker today.

## 2016-07-01 NOTE — Discharge Summary (Signed)
ELECTROPHYSIOLOGY PROCEDURE DISCHARGE SUMMARY    Patient ID: [name],  MRN: [id number], DOB/AGE: 1927-08-03 83 y.o.  Admit date: 06/30/2016 Discharge date: 07/02/2016  Primary Care Physician: Eloise Levels, NP (Inactive) Primary Cardiologist: Meda Coffee Electrophysiologist: Lovena Le  Primary Discharge Diagnosis:  Symptomatic bradycardia status post pacemaker implantation this admission  Secondary Discharge Diagnosis:  1.  HTN 2.  GERD 3.  CKD 4.  Frequent PAC's/PVC's  Allergies  Allergen Reactions  . Penicillins Rash  . Amoxicillin Other (See Comments)    Very weak Has patient had a PCN reaction causing immediate rash, facial/tongue/throat swelling, SOB or lightheadedness with hypotension: no Has patient had a PCN reaction causing severe rash involving mucus membranes or skin necrosis: no Has patient had a PCN reaction that required hospitalization: no Has patient had a PCN reaction occurring within the last 10 years: no If all of the above answers are "NO", then may proceed with Cephalosporin use.   . Diltiazem Hcl Other (See Comments)    Reaction unknown  . Sulfonamide Derivatives Other (See Comments)    Reaction unknown  . Zetia [Ezetimibe] Other (See Comments)    Weakness     Procedures This Admission:  1.  Implantation of a BSX dual chamber PPM on 07/01/16 by Dr Lovena Le.  See op note for full details. There were no immediate post procedure complications. 2.  CXR on 07/02/16 demonstrated no pneumothorax status post device implantation.   Brief HPI/Hospital Course:  [name] is a 83 y.o. female with the above past medical history. She was seen initially by Dr Lovena Le for evaluation of bradycardia felt to be 2/2 frequent ectopy.  She was started on low dose Flecainide but had persistent symptomatic sinus bradycardia.  She presented to the ER for evaluation and was found to be bradycardic with rates in the 30's.  She was given atropine with improvement in  heart rate and symptoms.  The patient has had symptomatic bradycardia without reversible causes identified.  Risks, benefits, and alternatives to PPM implantation were reviewed with the patient who wished to proceed.  The patient underwent implantation of a BSX dual chamber pacemaker with details as outlined above. She was monitored on telemetry overnight which demonstrated atrial pacing with intrinsic ventricular conduction.  Left chest was without hematoma or ecchymosis.  The device was interrogated and found to be functioning normally.  CXR was obtained and demonstrated no pneumothorax status post device implantation.  Wound care, arm mobility, and restrictions were reviewed with the patient.  The patient was examined and considered stable for discharge to home.    Physical Exam: Vitals:   07/01/16 1941 07/01/16 2341 07/02/16 0040 07/02/16 0441  BP: 131/64 (!) 148/72 127/61 (!) 154/61  Pulse: (!) 58  (!) 59 75  Resp: 13  19 15   Temp: 98.3 F (36.8 C)  97.8 F (36.6 C) 97.6 F (36.4 C)  TempSrc: Oral  Oral Oral  SpO2: 97%  98% 96%  Weight:    167 lb 12.8 oz (76.1 kg)  Height:        GEN- The patient is well appearing, alert and oriented x 3 today.   HEENT: normocephalic, atraumatic; sclera clear, conjunctiva pink; hearing intact; oropharynx clear; neck supple Lungs- Clear to ausculation bilaterally, normal work of breathing.  No wheezes, rales, rhonchi Heart- Regular rate and rhythm, no murmurs, rubs or gallops GI- soft, non-tender, non-distended, bowel sounds present  Extremities- no clubbing, cyanosis, or edema MS- no significant deformity or atrophy Skin-  warm and dry, no rash or lesion, left chest without hematoma/ecchymosis Psych- euthymic mood, full affect Neuro- strength and sensation are intact   Labs:   Lab Results  Component Value Date   WBC 5.2 07/01/2016   HGB 9.5 (L) 07/01/2016   HCT 29.9 (L) 07/01/2016   MCV 99.0 07/01/2016   PLT 145 (L) 07/01/2016     Recent  Labs Lab 07/01/16 0332  NA 140  K 4.3  CL 108  CO2 24  BUN 20  CREATININE 1.31*  CALCIUM 8.9  GLUCOSE 100*    Discharge Medications:  Allergies as of 07/02/2016      Reactions   Penicillins Rash   Amoxicillin Other (See Comments)   Very weak Has patient had a PCN reaction causing immediate rash, facial/tongue/throat swelling, SOB or lightheadedness with hypotension: no Has patient had a PCN reaction causing severe rash involving mucus membranes or skin necrosis: no Has patient had a PCN reaction that required hospitalization: no Has patient had a PCN reaction occurring within the last 10 years: no If all of the above answers are "NO", then may proceed with Cephalosporin use.   Diltiazem Hcl Other (See Comments)   Reaction unknown   Sulfonamide Derivatives Other (See Comments)   Reaction unknown   Zetia [ezetimibe] Other (See Comments)   Weakness      Medication List    STOP taking these medications   flecainide 50 MG tablet Commonly known as:  TAMBOCOR     TAKE these medications   acetaminophen 500 MG tablet Commonly known as:  TYLENOL Take 1,000 mg by mouth every 6 (six) hours as needed for mild pain, moderate pain or headache.   albuterol 108 (90 Base) MCG/ACT inhaler Commonly known as:  PROVENTIL HFA;VENTOLIN HFA Inhale 1 puff into the lungs every 6 (six) hours as needed for wheezing or shortness of breath.   alendronate 70 MG tablet Commonly known as:  FOSAMAX Take 70 mg by mouth every Friday.   allopurinol 100 MG tablet Commonly known as:  ZYLOPRIM Take 1 tablet (100 mg total) by mouth daily.   aspirin EC 81 MG tablet Take 81 mg by mouth at bedtime.   cholecalciferol 1000 units tablet Commonly known as:  VITAMIN D Take 1,000 Units by mouth at bedtime.   DRY EYES OP Place 1-2 drops into both eyes as needed (for dry eyes).   Ferrous Sulfate 28 MG Tabs Take 28 mg by mouth at bedtime.   FISH OIL BURP-LESS 1200 MG Caps Take 3 capsules by mouth at  bedtime.   losartan 25 MG tablet Commonly known as:  COZAAR Take 1 tablet (25 mg total) by mouth daily. What changed:  when to take this   montelukast 10 MG tablet Commonly known as:  SINGULAIR Take 10 mg by mouth at bedtime.   multivitamin with minerals tablet Take 1 tablet by mouth daily.   ondansetron 4 MG tablet Commonly known as:  ZOFRAN Take 4 mg by mouth every 8 (eight) hours as needed. For nausea   pantoprazole 40 MG tablet Commonly known as:  PROTONIX Take 1 tablet (40 mg total) by mouth daily. What changed:  when to take this   pravastatin 20 MG tablet Commonly known as:  PRAVACHOL Take 20 mg by mouth at bedtime.   sertraline 50 MG tablet Commonly known as:  ZOLOFT Take 50 mg by mouth every evening.   vitamin E 400 UNIT capsule Take 400 Units by mouth at bedtime.  Disposition:  Discharge Instructions    Diet - low sodium heart healthy    Complete by:  As directed    Increase activity slowly    Complete by:  As directed      Follow-up Information    [facility] Follow up on 07/13/2016.   Specialty:  Cardiology Why:  at 10am for wound check  Contact information: [address], [address] [place]       Evans Lance, MD Follow up on 10/05/2016.   Specialty:  Cardiology Why:  at [place] information: [address] [place] [address] [phone]           Duration of Discharge Encounter: Greater than 30 minutes including physician time.  Signed, Chanetta Marshall, NP 07/02/2016 6:42 AM  EP Attending  Patient seen and examined. Agree with above. The patient is stable after her PPM insertion. Her device has been interrogated under my direction. She will be discharged home with usual followup.   Mikle Bosworth.D.

## 2016-07-01 NOTE — H&P (View-Only) (Signed)
Electrophysiology Rounding Note  Patient Name: [name] Date of Encounter: 07/01/2016  Primary Cardiologist: Meda Coffee Electrophysiologist: Lovena Le   Subjective   The patient is doing well today. She remains weak.  Heart rates in the 30's at rest, 40-50's with exertion.   Inpatient Medications    Scheduled Meds: . allopurinol  100 mg Oral Daily  . aspirin EC  81 mg Oral Daily  . chlorhexidine  60 mL Topical Once  . cholecalciferol  1,000 Units Oral Daily  . gentamicin irrigation  80 mg Irrigation On Call  . losartan  25 mg Oral Daily  . montelukast  10 mg Oral QHS  . pantoprazole  40 mg Oral Daily  . pravastatin  20 mg Oral Q supper  . sertraline  50 mg Oral QPM  . vitamin E  400 Units Oral Daily   Continuous Infusions: . sodium chloride 50 mL/hr at 07/01/16 0622  . vancomycin     PRN Meds: acetaminophen, albuterol, ondansetron   Vital Signs    Vitals:   06/30/16 1450 06/30/16 1651 06/30/16 1955 07/01/16 0530  BP: (!) 161/60 (!) 150/47 (!) 139/45 (!) 138/39  Pulse: (!) 38 (!) 36 (!) 38 (!) 37  Resp: 14 13 16 15   Temp: 98.6 F (37 C) 97.7 F (36.5 C) 98.8 F (37.1 C) 98.6 F (37 C)  TempSrc: Oral Oral Oral Oral  SpO2: 100% 96% 95% 96%  Weight: 168 lb 12.8 oz (76.6 kg)   167 lb 9.6 oz (76 kg)  Height: 5\' 2"  (1.575 m)       Intake/Output Summary (Last 24 hours) at 07/01/16 0635 Last data filed at 07/01/16 0500  Gross per 24 hour  Intake                0 ml  Output             1850 ml  Net            -1850 ml   Filed Weights   06/30/16 1133 06/30/16 1450 07/01/16 0530  Weight: 168 lb (76.2 kg) 168 lb 12.8 oz (76.6 kg) 167 lb 9.6 oz (76 kg)    Physical Exam    GEN- The patient is elderly appearing, alert and oriented x 3 today.   Head- normocephalic, atraumatic Eyes-  Sclera clear, conjunctiva pink Ears- hearing intact Oropharynx- clear Neck- supple Lungs- Clear to ausculation bilaterally, normal work of breathing Heart- Bradycardic regular  rate and rhythm  GI- soft, NT, ND, + BS Extremities- no clubbing, cyanosis, or edema Skin- no rash or lesion Psych- euthymic mood, full affect Neuro- strength and sensation are intact  Labs    CBC  Recent Labs  06/30/16 1136 07/01/16 0332  WBC 4.8 5.2  NEUTROABS  --  2.9  HGB 10.0* 9.5*  HCT 31.2* 29.9*  MCV 98.4 99.0  PLT 149* 709*   Basic Metabolic Panel  Recent Labs  06/30/16 1136 07/01/16 0332  NA 139 140  K 4.4 4.3  CL 109 108  CO2 24 24  GLUCOSE 98 100*  BUN 22* 20  CREATININE 1.26* 1.31*  CALCIUM 9.1 8.9     Telemetry    Sinus bradycardia, 30's, blocked PAC's (personally reviewed)  Radiology    Dg Chest Port 1 View  Result Date: 06/30/2016 CLINICAL DATA:  83 year old female with a history of weakness and dizziness EXAM: PORTABLE CHEST 1 VIEW COMPARISON:  06/28/2015, 09/05/2013, 04/02/2013 FINDINGS: Cardiomediastinal silhouette similar to the prior. Fullness in the  bilateral hilar regions. Calcifications of the aortic arch. No pneumothorax. No pleural effusion. Linear opacities at the bilateral lung bases unchanged. No evidence of interlobular septal thickening. No displaced fracture. IMPRESSION: Chronic lung changes without evidence of acute cardiopulmonary disease. Aortic atherosclerosis Electronically Signed   By: Corrie Mckusick D.O.   On: 06/30/2016 12:42     Patient Profile     [name] is a 83 y.o. female with a past medical history significant for HTN, GERD, CKD.  She was admitted for symptomatic sinus bradycardia.   Assessment & Plan    1.  Symptomatic bradycardia Persists this morning with rates in the 30's Will plan PPM implant later today Risks, benefits reviewed with patient who wishes to proceed  2.  HTN Stable No change required today  Dr Lovena Le to see later today    Signed, Chanetta Marshall, NP  07/01/2016, 6:35 AM   EP Attending  Patient seen and examined. Agree with above. She has symptomatic bradycardia and will plan  to proceed with PPM insertion later today. I have discussed the indications/risks/benefits/goals/expectations of the procedure and she wishes to proceed.  Mikle Bosworth.D.

## 2016-07-01 NOTE — Interval H&P Note (Signed)
History and Physical Interval Note:  07/01/2016 3:28 PM  [name]  has presented today for surgery, with the diagnosis of bradicardia  The various methods of treatment have been discussed with the patient and family. After consideration of risks, benefits and other options for treatment, the patient has consented to  Procedure(s): Pacemaker Implant (N/A) as a surgical intervention .  The patient's history has been reviewed, patient examined, no change in status, stable for surgery.  I have reviewed the patient's chart and labs.  Questions were answered to the patient's satisfaction.     Cristopher Peru

## 2016-07-01 NOTE — Progress Notes (Signed)
Electrophysiology Rounding Note  Patient Name: [name] Date of Encounter: 07/01/2016  Primary Cardiologist: Meda Coffee Electrophysiologist: Lovena Le   Subjective   The patient is doing well today. She remains weak.  Heart rates in the 30's at rest, 40-50's with exertion.   Inpatient Medications    Scheduled Meds: . allopurinol  100 mg Oral Daily  . aspirin EC  81 mg Oral Daily  . chlorhexidine  60 mL Topical Once  . cholecalciferol  1,000 Units Oral Daily  . gentamicin irrigation  80 mg Irrigation On Call  . losartan  25 mg Oral Daily  . montelukast  10 mg Oral QHS  . pantoprazole  40 mg Oral Daily  . pravastatin  20 mg Oral Q supper  . sertraline  50 mg Oral QPM  . vitamin E  400 Units Oral Daily   Continuous Infusions: . sodium chloride 50 mL/hr at 07/01/16 0622  . vancomycin     PRN Meds: acetaminophen, albuterol, ondansetron   Vital Signs    Vitals:   06/30/16 1450 06/30/16 1651 06/30/16 1955 07/01/16 0530  BP: (!) 161/60 (!) 150/47 (!) 139/45 (!) 138/39  Pulse: (!) 38 (!) 36 (!) 38 (!) 37  Resp: 14 13 16 15   Temp: 98.6 F (37 C) 97.7 F (36.5 C) 98.8 F (37.1 C) 98.6 F (37 C)  TempSrc: Oral Oral Oral Oral  SpO2: 100% 96% 95% 96%  Weight: 168 lb 12.8 oz (76.6 kg)   167 lb 9.6 oz (76 kg)  Height: 5\' 2"  (1.575 m)       Intake/Output Summary (Last 24 hours) at 07/01/16 0635 Last data filed at 07/01/16 0500  Gross per 24 hour  Intake                0 ml  Output             1850 ml  Net            -1850 ml   Filed Weights   06/30/16 1133 06/30/16 1450 07/01/16 0530  Weight: 168 lb (76.2 kg) 168 lb 12.8 oz (76.6 kg) 167 lb 9.6 oz (76 kg)    Physical Exam    GEN- The patient is elderly appearing, alert and oriented x 3 today.   Head- normocephalic, atraumatic Eyes-  Sclera clear, conjunctiva pink Ears- hearing intact Oropharynx- clear Neck- supple Lungs- Clear to ausculation bilaterally, normal work of breathing Heart- Bradycardic regular  rate and rhythm  GI- soft, NT, ND, + BS Extremities- no clubbing, cyanosis, or edema Skin- no rash or lesion Psych- euthymic mood, full affect Neuro- strength and sensation are intact  Labs    CBC  Recent Labs  06/30/16 1136 07/01/16 0332  WBC 4.8 5.2  NEUTROABS  --  2.9  HGB 10.0* 9.5*  HCT 31.2* 29.9*  MCV 98.4 99.0  PLT 149* 361*   Basic Metabolic Panel  Recent Labs  06/30/16 1136 07/01/16 0332  NA 139 140  K 4.4 4.3  CL 109 108  CO2 24 24  GLUCOSE 98 100*  BUN 22* 20  CREATININE 1.26* 1.31*  CALCIUM 9.1 8.9     Telemetry    Sinus bradycardia, 30's, blocked PAC's (personally reviewed)  Radiology    Dg Chest Port 1 View  Result Date: 06/30/2016 CLINICAL DATA:  83 year old female with a history of weakness and dizziness EXAM: PORTABLE CHEST 1 VIEW COMPARISON:  06/28/2015, 09/05/2013, 04/02/2013 FINDINGS: Cardiomediastinal silhouette similar to the prior. Fullness in the  bilateral hilar regions. Calcifications of the aortic arch. No pneumothorax. No pleural effusion. Linear opacities at the bilateral lung bases unchanged. No evidence of interlobular septal thickening. No displaced fracture. IMPRESSION: Chronic lung changes without evidence of acute cardiopulmonary disease. Aortic atherosclerosis Electronically Signed   By: Corrie Mckusick D.O.   On: 06/30/2016 12:42     Patient Profile     [name] is a 83 y.o. female with a past medical history significant for HTN, GERD, CKD.  She was admitted for symptomatic sinus bradycardia.   Assessment & Plan    1.  Symptomatic bradycardia Persists this morning with rates in the 30's Will plan PPM implant later today Risks, benefits reviewed with patient who wishes to proceed  2.  HTN Stable No change required today  Dr Lovena Le to see later today    Signed, Chanetta Marshall, NP  07/01/2016, 6:35 AM   EP Attending  Patient seen and examined. Agree with above. She has symptomatic bradycardia and will plan  to proceed with PPM insertion later today. I have discussed the indications/risks/benefits/goals/expectations of the procedure and she wishes to proceed.  Mikle Bosworth.D.

## 2016-07-02 ENCOUNTER — Inpatient Hospital Stay (HOSPITAL_COMMUNITY): Payer: Medicare Other

## 2016-07-02 NOTE — Progress Notes (Signed)
Pt back to floor from x-ray

## 2016-07-02 NOTE — Plan of Care (Addendum)
Problem: Safety: Goal: Ability to remain free from injury will improve Outcome: Progressing Fall risk bundle in place. Pt repositioned q2h. No signs of skin breakdown this shift. Will continue to monitor and assess pt. Bed locked in lowest position, call light in reach, bed alarm on, pt states she has no needs or concerns at this time.  Problem: Pain Managment: Goal: General experience of comfort will improve Outcome: Progressing Pt complained of 6/10 left shoulder pain. Tylenol given per orders. Pain is gradually improving. Will continue to monitor this shift.    Problem: Tissue Perfusion: Goal: Risk factors for ineffective tissue perfusion will decrease Outcome: Progressing Pt HR was in 60-70's at rest. EKG uploaded in chart.

## 2016-07-02 NOTE — Consult Note (Signed)
           [facility] CM Primary Care Navigator  07/02/2016  JASANI LENGEL [birth year] [phone]   Went to see patientat the bedsideto identify possible discharge needs but she was alreadydischarged.  Patient was discharged home per RN report. Primary Care Physician: Eloise Levels, NP- (Inactive) [name].   For questions, please contact:  Dannielle Huh, BSN, RN- [facility] Primary Care Navigator  Telephone: [phone] [place]

## 2016-07-02 NOTE — Discharge Instructions (Signed)
Supplemental Discharge Instructions for  Pacemaker/Defibrillator Patients  Activity No heavy lifting or vigorous activity with your left/right arm for 6 to 8 weeks.  Do not raise your left/right arm above your head for one week.  Gradually raise your affected arm as drawn below.           __     07/05/16                   07/06/16                    07/07/16                      07/08/16      NO DRIVING for   1 week  ; you may begin driving on  3/71/06   .  WOUND CARE - Keep the wound area clean and dry.  Do not get this area wet for one week. No showers for one week; you may shower on   07/08/16  . - The tape/steri-strips on your wound will fall off; do not pull them off.  No bandage is needed on the site.  DO  NOT apply any creams, oils, or ointments to the wound area. - If you notice any drainage or discharge from the wound, any swelling or bruising at the site, or you develop a fever > 101? F after you are discharged home, call the office at once.  Special Instructions - You are still able to use cellular telephones; use the ear opposite the side where you have your pacemaker/defibrillator.  Avoid carrying your cellular phone near your device. - When traveling through airports, show security personnel your identification card to avoid being screened in the metal detectors.  Ask the security personnel to use the hand wand. - Avoid arc welding equipment, MRI testing (magnetic resonance imaging), TENS units (transcutaneous nerve stimulators).  Call the office for questions about other devices. - Avoid electrical appliances that are in poor condition or are not properly grounded. - Microwave ovens are safe to be near or to operate.    Bradycardia, Adult Bradycardia is a slower-than-normal heartbeat. A normal resting heart rate for an adult ranges from 60 to 100 beats per minute. With bradycardia, the resting heart rate is less than 60 beats per minute. Bradycardia can prevent enough  oxygen from reaching certain areas of your body when you are active. It can be serious if it keeps enough oxygen from reaching your brain and other parts of your body. Bradycardia is not a problem for everyone. For some healthy adults, a slow resting heart rate is normal. What are the causes? This condition may be caused by:  A problem with the heart, including:  A problem with the heart's electrical system, such as a heart block.  A problem with the heart's natural pacemaker (sinus node).  Heart disease.  A heart attack.  Heart damage.  A heart infection.  A heart condition that is present at birth (congenital heart defect).  Certain medicines that treat heart conditions.  Certain conditions, such as hypothyroidism and obstructive sleep apnea.  Problems with the balance of chemicals and other substances, like potassium, in the blood. What increases the risk? This condition is more likely to develop in adults who:  Are age 15 or older.  Have high blood pressure (hypertension), high cholesterol (hyperlipidemia), or diabetes.  Drink heavily, use tobacco or nicotine products, or use drugs.  Are stressed. What are the signs or symptoms? Symptoms of this condition include:  Light-headedness.  Feeling faint or fainting.  Fatigue and weakness.  Shortness of breath.  Chest pain (angina).  Drowsiness.  Confusion.  Dizziness. How is this diagnosed? This condition may be diagnosed based on:  Your symptoms.  Your medical history.  A physical exam. During the exam, your health care provider will listen to your heartbeat and check your pulse. To confirm the diagnosis, your health care provider may order tests, such as:  Blood tests.  An electrocardiogram (ECG). This test records the heart's electrical activity. The test can show how fast your heart is beating and whether the heartbeat is steady.  A test in which you wear a portable device (event recorder or Holter  monitor) to record your heart's electrical activity while you go about your day.  Anexercise test. How is this treated? Treatment for this condition depends on the cause of the condition and how severe your symptoms are. Treatment may involve:  Treatment of the underlying condition.  Changing your medicines or how much medicine you take.  Having a small, battery-operated device called a pacemaker implanted under the skin. When bradycardia occurs, this device can be used to increase your heart rate and help your heart to beat in a regular rhythm. Follow these instructions at home: Lifestyle    Manage any health conditions that contribute to bradycardia as told by your health care provider.  Follow a heart-healthy diet. A nutrition specialist (dietitian) can help to educate you about healthy food options and changes.  Follow an exercise program that is approved by your health care provider.  Maintain a healthy weight.  Try to reduce or manage your stress, such as with yoga or meditation. If you need help reducing stress, ask your health care provider.  Do not use use any products that contain nicotine or tobacco, such as cigarettes and e-cigarettes. If you need help quitting, ask your health care provider.  Do not use illegal drugs.  Limit alcohol intake to no more than 1 drink per day for nonpregnant women and 2 drinks per day for men. One drink equals 12 oz of beer, 5 oz of wine, or 1 oz of hard liquor. General instructions   Take over-the-counter and prescription medicines only as told by your health care provider.  Keep all follow-up visits as directed by your health care provider. This is important. How is this prevented? In some cases, bradycardia may be prevented by:  Treating underlying medical problems.  Stopping behaviors or medicines that can trigger the condition. Contact a health care provider if:  You feel light-headed or dizzy.  You almost faint.  You  feel weak or are easily fatigued during physical activity.  You experience confusion or have memory problems. Get help right away if:  You faint.  You have an irregular heartbeat (palpitations).  You have chest pain.  You have trouble breathing. This information is not intended to replace advice given to you by your health care provider. Make sure you discuss any questions you have with your health care provider. Document Released: 10/17/2001 Document Revised: 09/23/2015 Document Reviewed: 07/17/2015 Elsevier Interactive Patient Education  2017 [employer].   Pacemaker Implantation, Adult, Care After This sheet gives you information about how to care for yourself after your procedure. Your health care provider may also give you more specific instructions. If you have problems or questions, contact your health care provider. What can I expect after the  procedure? After the procedure, it is common to have:  Mild pain.  Slight bruising.  Some swelling over the incision.  A slight bump over the skin where the device was placed. Sometimes, it is possible to feel the device under the skin. This is normal. Follow these instructions at home: Medicines   Take over-the-counter and prescription medicines only as told by your health care provider.  If you were prescribed an antibiotic medicine, take it as told by your health care provider. Do not stop taking the antibiotic even if you start to feel better. Wound care   Do not remove the bandage on your chest until directed to do so by your health care provider.  After your bandage is removed, you may see pieces of tape called skin adhesive strips over the area where the cut was made (incision site). Let them fall off on their own.  Check the incision site every day to make sure it is not infected, bleeding, or starting to pull apart.  Do not use lotions or ointments near the incision site unless directed to do so.  Keep the  incision area clean and dry for 2-3 days after the procedure or as directed by your health care provider. It takes several weeks for the incision site to completely heal.  Do not take baths, swim, or use a hot tub for 7-10 days or as otherwise directed by your health care provider. Activity   Do not drive or use heavy machinery while taking prescription pain medicine.  Do not drive for 24 hours if you were given a medicine to help you relax (sedative).  Check with your health care provider before you start to drive or play sports.  Avoid sudden jerking, pulling, or chopping movements that pull your upper arm far away from your body. Avoid these movements for at least 6 weeks or as long as told by your health care provider.  Do not lift your upper arm above your shoulders for at least 6 weeks or as long as told by your health care provider. This means no tennis, golf, or swimming.  You may go back to work when your health care provider says it is okay. Pacemaker care   You may be shown how to transfer data from your pacemaker through the phone to your health care provider.  Always let all health care providers know about your pacemaker before you have any medical procedures or tests.  Wear a medical ID bracelet or necklace stating that you have a pacemaker. Carry a pacemaker ID card with you at all times.  Your pacemaker battery will last for 5-15 years. Routine checks by your health care provider will let the health care provider know when the battery is starting to run down. The pacemaker will need to be replaced when the battery starts to run down.  Do not use amateur Chief of Staff. Other electrical devices are safe to use, including power tools, lawn mowers, and speakers. If you are unsure of whether something is safe to use, ask your health care provider.  When using your cell phone, hold it to the ear opposite the pacemaker. Do not leave your cell phone  in a pocket over the pacemaker.  Avoid places or objects that have a strong electric or magnetic field, including:  Engineer, maintenance. When at the airport, let officials know that you have a pacemaker.  Power plants.  Large electrical generators.  Radiofrequency transmission towers, such as  cell phone and radio towers. General instructions   Weigh yourself every day. If you suddenly gain weight, fluid may be building up in your body.  Keep all follow-up visits as told by your health care provider. This is important. Contact a health care provider if:  You gain weight suddenly.  Your legs or feet swell.  It feels like your heart is fluttering or skipping beats (heart palpitations).  You have chills or a fever.  You have more redness, swelling, or pain around your incisions.  You have more fluid or blood coming from your incisions.  Your incisions feel warm to the touch.  You have pus or a bad smell coming from your incisions. Get help right away if:  You have chest pain.  You have trouble breathing or are short of breath.  You become extremely tired.  You are light-headed or you faint. This information is not intended to replace advice given to you by your health care provider. Make sure you discuss any questions you have with your health care provider. Document Released: 08/14/2004 Document Revised: 11/07/2015 Document Reviewed: 11/07/2015 Elsevier Interactive Patient Education  2017 [employer].

## 2016-07-02 NOTE — Progress Notes (Signed)
Reviewed discharge instructions with patient and she stated her understanding.  Reviewed pacemaker discharge instructions and arm limitations with patient also.  Discharged home with son via wheelchair. Sanda Linger

## 2016-07-02 NOTE — Progress Notes (Signed)
Pt transported to xray 

## 2016-07-06 ENCOUNTER — Telehealth: Payer: Self-pay | Admitting: Internal Medicine

## 2016-07-06 DIAGNOSIS — R001 Bradycardia, unspecified: Secondary | ICD-10-CM | POA: Diagnosis not present

## 2016-07-06 NOTE — Telephone Encounter (Signed)
New Message     Pt left all her paperwork and instructions on the bed in room 36 Friday when she left the office, please call her

## 2016-07-06 NOTE — Telephone Encounter (Signed)
Follow up    Pt is calling to follow up.

## 2016-07-07 ENCOUNTER — Ambulatory Visit: Payer: Medicare Other | Admitting: Internal Medicine

## 2016-07-07 NOTE — Telephone Encounter (Signed)
Called, pt unavailable. No voicemail.

## 2016-07-08 ENCOUNTER — Telehealth: Payer: Self-pay | Admitting: Cardiology

## 2016-07-08 NOTE — Telephone Encounter (Signed)
New message       Pt request a callback from the nurse.  Not other details given

## 2016-07-08 NOTE — Telephone Encounter (Signed)
Pt calling to clarify her pacemaker discharge instructions that she has misplaced.  Pt is mainly concerned as to when she can begin lifting her affected arm above her shoulder.  Went over lifting instructions with the pt as mentioned below.  Pt verbalized understanding and agrees with this plan.  Pt gracious for all the assistance provided.      ACTIVITY No heavy lifting or vigorous activity with your left/right arm for 6 to 8 weeks. Do not raise your left/right arm above your head for one week. Gradually raise your affected arm as drawn below. __ 07/05/16 07/06/16 07/07/16 07/08/16 NO DRIVING for 1 week ; you may begin driving on 05/22/22 . WOUND CARE . Keep the wound area clean and dry. Do not get this area wet for one week. No showers for one week; you may shower on 07/08/16 . Marland Kitchen The tape/steri-strips on your wound will fall off; do not pull them off. No bandage is needed on the site. DO NOT apply any creams, oils, or ointments to the wound area. . If you notice any drainage or discharge from the wound, any swelling or bruising at the site, or you develop a fever > 101F after you are discharged home, call the office at once. SPECIAL INSTRUCTIONS . You are still able to use cellular telephones; use the ear opposite the side where you have your pacemaker/defibrillator. Avoid carrying your cellular phone near your device. . When traveling through airports, show security personnel your identification card to avoid being screened in the metal detectors. Ask the security personnel to use the hand wand. . Avoid arc welding equipment, MRI testing (magnetic resonance imaging), TENS units (transcutaneous nerve stimulators). Call the office for questions about other devices. . Avoid electrical appliances that are in poor condition or are not properly grounded. Marland Kitchen

## 2016-07-13 ENCOUNTER — Ambulatory Visit (INDEPENDENT_AMBULATORY_CARE_PROVIDER_SITE_OTHER): Payer: Medicare Other | Admitting: *Deleted

## 2016-07-13 DIAGNOSIS — I495 Sick sinus syndrome: Secondary | ICD-10-CM | POA: Diagnosis not present

## 2016-07-13 LAB — CUP PACEART INCLINIC DEVICE CHECK
Date Time Interrogation Session: 20180605040000
Implantable Lead Implant Date: 20180524
Implantable Lead Location: 753859
Implantable Lead Location: 753860
Implantable Lead Model: 7740
Implantable Lead Serial Number: 861246
Lead Channel Pacing Threshold Amplitude: 0.9 V
Lead Channel Sensing Intrinsic Amplitude: 7.1 mV
Lead Channel Setting Pacing Pulse Width: 0.4 ms
MDC IDC LEAD IMPLANT DT: 20180524
MDC IDC LEAD SERIAL: 693708
MDC IDC MSMT LEADCHNL RA IMPEDANCE VALUE: 571 Ohm
MDC IDC MSMT LEADCHNL RA PACING THRESHOLD PULSEWIDTH: 0.4 ms
MDC IDC MSMT LEADCHNL RV IMPEDANCE VALUE: 630 Ohm
MDC IDC MSMT LEADCHNL RV PACING THRESHOLD AMPLITUDE: 0.8 V
MDC IDC MSMT LEADCHNL RV PACING THRESHOLD PULSEWIDTH: 0.4 ms
MDC IDC MSMT LEADCHNL RV SENSING INTR AMPL: 5.6 mV
MDC IDC PG IMPLANT DT: 20180524
MDC IDC PG SERIAL: 308044
MDC IDC SET LEADCHNL RA PACING AMPLITUDE: 3.5 V
MDC IDC SET LEADCHNL RV PACING AMPLITUDE: 3.5 V
MDC IDC SET LEADCHNL RV SENSING SENSITIVITY: 2 mV

## 2016-07-13 NOTE — Progress Notes (Signed)
Wound check appointment. Steri-strips removed. Wound without redness or edema. Incision edges approximated, wound well healed. Normal device function. Thresholds, sensing, and impedances consistent with implant measurements. Device programmed at 3.5V for extra safety margin until 3 month visit. Histogram distribution appropriate for patient and level of activity. No mode switches or high ventricular rates noted. Patient educated about wound care, arm mobility, lifting restrictions. ROV in 3 months with GT. 

## 2016-07-26 DIAGNOSIS — I4891 Unspecified atrial fibrillation: Secondary | ICD-10-CM | POA: Diagnosis not present

## 2016-07-26 DIAGNOSIS — I129 Hypertensive chronic kidney disease with stage 1 through stage 4 chronic kidney disease, or unspecified chronic kidney disease: Secondary | ICD-10-CM | POA: Diagnosis not present

## 2016-07-26 DIAGNOSIS — I10 Essential (primary) hypertension: Secondary | ICD-10-CM | POA: Diagnosis not present

## 2016-07-26 DIAGNOSIS — M109 Gout, unspecified: Secondary | ICD-10-CM | POA: Diagnosis not present

## 2016-07-26 DIAGNOSIS — K219 Gastro-esophageal reflux disease without esophagitis: Secondary | ICD-10-CM | POA: Diagnosis not present

## 2016-07-26 DIAGNOSIS — E782 Mixed hyperlipidemia: Secondary | ICD-10-CM | POA: Diagnosis not present

## 2016-09-12 ENCOUNTER — Encounter (HOSPITAL_COMMUNITY): Payer: Self-pay | Admitting: Emergency Medicine

## 2016-09-12 ENCOUNTER — Ambulatory Visit (HOSPITAL_COMMUNITY)
Admission: EM | Admit: 2016-09-12 | Discharge: 2016-09-12 | Disposition: A | Discharge status: Home / Self Care | Payer: Medicare Other | Attending: Emergency Medicine | Admitting: Emergency Medicine

## 2016-09-12 ENCOUNTER — Ambulatory Visit (INDEPENDENT_AMBULATORY_CARE_PROVIDER_SITE_OTHER): Payer: Medicare Other

## 2016-09-12 DIAGNOSIS — J181 Lobar pneumonia, unspecified organism: Secondary | ICD-10-CM

## 2016-09-12 DIAGNOSIS — Z880 Allergy status to penicillin: Secondary | ICD-10-CM | POA: Insufficient documentation

## 2016-09-12 DIAGNOSIS — Z882 Allergy status to sulfonamides status: Secondary | ICD-10-CM | POA: Insufficient documentation

## 2016-09-12 DIAGNOSIS — Z950 Presence of cardiac pacemaker: Secondary | ICD-10-CM | POA: Insufficient documentation

## 2016-09-12 DIAGNOSIS — J189 Pneumonia, unspecified organism: Secondary | ICD-10-CM

## 2016-09-12 DIAGNOSIS — R05 Cough: Secondary | ICD-10-CM | POA: Diagnosis not present

## 2016-09-12 LAB — CBC WITH DIFFERENTIAL/PLATELET
BASOS PCT: 0 %
Basophils Absolute: 0 10*3/uL (ref 0.0–0.1)
EOS ABS: 0.2 10*3/uL (ref 0.0–0.7)
Eosinophils Relative: 3 %
HCT: 32.2 % — ABNORMAL LOW (ref 36.0–46.0)
HEMOGLOBIN: 10.5 g/dL — AB (ref 12.0–15.0)
LYMPHS ABS: 1.4 10*3/uL (ref 0.7–4.0)
Lymphocytes Relative: 20 %
MCH: 32 pg (ref 26.0–34.0)
MCHC: 32.6 g/dL (ref 30.0–36.0)
MCV: 98.2 fL (ref 78.0–100.0)
MONO ABS: 0.4 10*3/uL (ref 0.1–1.0)
MONOS PCT: 6 %
NEUTROS ABS: 5.1 10*3/uL (ref 1.7–7.7)
NEUTROS PCT: 71 %
Platelets: 143 10*3/uL — ABNORMAL LOW (ref 150–400)
RBC: 3.28 MIL/uL — AB (ref 3.87–5.11)
RDW: 13.5 % (ref 11.5–15.5)
WBC: 7.2 10*3/uL (ref 4.0–10.5)

## 2016-09-12 LAB — POCT I-STAT, CHEM 8
BUN: 25 mg/dL — AB (ref 6–20)
CHLORIDE: 107 mmol/L (ref 101–111)
Calcium, Ion: 1.13 mmol/L — ABNORMAL LOW (ref 1.15–1.40)
Creatinine, Ser: 1.5 mg/dL — ABNORMAL HIGH (ref 0.44–1.00)
Glucose, Bld: 110 mg/dL — ABNORMAL HIGH (ref 65–99)
HEMATOCRIT: 32 % — AB (ref 36.0–46.0)
Hemoglobin: 10.9 g/dL — ABNORMAL LOW (ref 12.0–15.0)
Potassium: 4.4 mmol/L (ref 3.5–5.1)
SODIUM: 143 mmol/L (ref 135–145)
TCO2: 27 mmol/L (ref 0–100)

## 2016-09-12 MED ORDER — LEVOFLOXACIN 500 MG PO TABS: 500.0000 mg | ORAL_TABLET | Freq: Every day | ORAL | 0 refills | Status: DC

## 2016-09-12 NOTE — Discharge Instructions (Signed)
Keep a sharp watch over your symptoms, if you have any change in mentation, appetite, or fluid intake, or worsening of respiratory effort, go to the emergency room at once. I would like you to follow up with her primary care provider as soon as possible, preferably tomorrow, for reevaluation to make sure she is stable. Again, if there are any changes, go to the ER

## 2016-09-12 NOTE — ED Provider Notes (Signed)
[name]   [id number] 09/12/16 Arrival Time: [place]:  1. Pneumonia of right lower lobe due to infectious organism [facility])     Meds ordered this encounter  Medications  . levofloxacin (LEVAQUIN) 500 MG tablet    Sig: Take 1 tablet (500 mg total) by mouth daily.    Dispense:  5 tablet    Refill:  0    Order Specific Question:   Supervising Provider    Answer:   Sherlene Shams [[id number]]    Reviewed expectations re: course of current medical issues. Questions answered. Outlined signs and symptoms indicating need for more acute intervention. Patient verbalized understanding. After Visit Summary given.   SUBJECTIVE:  [name] is a 83 y.o. female who presents with complaint of Cough for 2 days that is been productive, with yellow sputum. She has had no fever, or chills, appetite is normal, she states she has been drinking fluids, last urination was 2-3 hours ago. She does have an extensive medical history, and recently had a pacemaker placed. She reports no complications with this procedure. She does not smoke, but states that she did smoke in her youth, "50 years ago" denies nausea, vomiting, or diarrhea.  Daughter is a Designer, jewellery working in family medicine, discuss the diagnosis and potential complications with the both patient and daughter, patient meets criteria for admission at 2 out of 5 on [address], however we will attempt outpatient therapy. She is encouraged to follow up with primary care provider tomorrow, go to the ER any time symptoms worsen  ROS: As per HPI, otherwise negative.   OBJECTIVE:  Vitals:   09/12/16 1223  BP: (!) 146/79  Pulse: (!) 59  Resp: 18  Temp: 98.8 F (37.1 C)  TempSrc: Oral  SpO2: 96%     General appearance: alert; no distress, Appearing stated age HEENT: normocephalic; atraumatic; conjunctivae normal; TMs normal; nasal mucosa normal; oral mucosa normal Neck: supple, cervical lymphadenopathy Lungs:  Normal effort, rhonchi heard right lower field Heart: regular rate and rhythm, +2 pulses Abdomen: soft, non-tender; bowel sounds normal; no masses or organomegaly; no guarding or rebound tenderness Back: no CVA tenderness Extremities: no cyanosis or edema; symmetrical with no gross deformities Skin: warm and dry Neurologic: normal symmetric reflexes; normal gait Psychological:  alert and cooperative; normal mood and affect   Labs Reviewed  CBC WITH DIFFERENTIAL/PLATELET - Abnormal; Notable for the following:       Result Value   RBC 3.28 (*)    Hemoglobin 10.5 (*)    HCT 32.2 (*)    Platelets 143 (*)    All other components within normal limits  POCT I-STAT, CHEM 8 - Abnormal; Notable for the following:    BUN 25 (*)    Creatinine, Ser 1.50 (*)    Glucose, Bld 110 (*)    Calcium, Ion 1.13 (*)    Hemoglobin 10.9 (*)    HCT 32.0 (*)    All other components within normal limits    Dg Chest 2 View  Result Date: 09/12/2016 CLINICAL DATA:  Cough, productive cough EXAM: CHEST  2 VIEW COMPARISON:  07/02/2016 FINDINGS: LEFT-sided pacemaker overlies enlarged cardiac silhouette. There is streaky opacity mean lingula no new from prior. Potential airspace disease in the lower lobe on lateral projection. IMPRESSION: Concern for lingular pneumonia versus atelectasis. Electronically Signed   By: Suzy Bouchard M.D.   On: 09/12/2016 12:59    Allergies  Allergen Reactions  . Penicillins Rash  .  Amoxicillin Other (See Comments)    Very weak Has patient had a PCN reaction causing immediate rash, facial/tongue/throat swelling, SOB or lightheadedness with hypotension: no Has patient had a PCN reaction causing severe rash involving mucus membranes or skin necrosis: no Has patient had a PCN reaction that required hospitalization: no Has patient had a PCN reaction occurring within the last 10 years: no If all of the above answers are "NO", then may proceed with Cephalosporin use.   . Diltiazem  Hcl Other (See Comments)    Reaction unknown  . Sulfonamide Derivatives Other (See Comments)    Reaction unknown  . Zetia [Ezetimibe] Other (See Comments)    Weakness    PMHx, SurgHx, SocialHx, Medications, and Allergies were reviewed in the Visit Navigator and updated as appropriate.      Barnet Glasgow, NP 09/12/16 1349

## 2016-09-12 NOTE — ED Triage Notes (Signed)
The patient presented to the [facility] with a complaint of a cough and congestion x 2 days. The patient stated that her PCP sent her here for a chest xray.

## 2016-09-13 DIAGNOSIS — N183 Chronic kidney disease, stage 3 (moderate): Secondary | ICD-10-CM | POA: Diagnosis not present

## 2016-09-13 DIAGNOSIS — J181 Lobar pneumonia, unspecified organism: Secondary | ICD-10-CM | POA: Diagnosis not present

## 2016-09-13 DIAGNOSIS — I129 Hypertensive chronic kidney disease with stage 1 through stage 4 chronic kidney disease, or unspecified chronic kidney disease: Secondary | ICD-10-CM | POA: Diagnosis not present

## 2016-09-24 DIAGNOSIS — M8580 Other specified disorders of bone density and structure, unspecified site: Secondary | ICD-10-CM | POA: Diagnosis not present

## 2016-09-24 DIAGNOSIS — J309 Allergic rhinitis, unspecified: Secondary | ICD-10-CM | POA: Diagnosis not present

## 2016-09-24 DIAGNOSIS — I10 Essential (primary) hypertension: Secondary | ICD-10-CM | POA: Diagnosis not present

## 2016-09-24 DIAGNOSIS — Z0000 Encounter for general adult medical examination without abnormal findings: Secondary | ICD-10-CM | POA: Diagnosis not present

## 2016-09-24 DIAGNOSIS — Z23 Encounter for immunization: Secondary | ICD-10-CM | POA: Diagnosis not present

## 2016-09-24 DIAGNOSIS — E782 Mixed hyperlipidemia: Secondary | ICD-10-CM | POA: Diagnosis not present

## 2016-09-24 DIAGNOSIS — I5032 Chronic diastolic (congestive) heart failure: Secondary | ICD-10-CM | POA: Diagnosis not present

## 2016-09-29 DIAGNOSIS — I129 Hypertensive chronic kidney disease with stage 1 through stage 4 chronic kidney disease, or unspecified chronic kidney disease: Secondary | ICD-10-CM | POA: Diagnosis not present

## 2016-09-29 DIAGNOSIS — N183 Chronic kidney disease, stage 3 (moderate): Secondary | ICD-10-CM | POA: Diagnosis not present

## 2016-10-05 ENCOUNTER — Ambulatory Visit (INDEPENDENT_AMBULATORY_CARE_PROVIDER_SITE_OTHER): Payer: Medicare Other | Admitting: Internal Medicine

## 2016-10-05 ENCOUNTER — Encounter: Payer: Self-pay | Admitting: Internal Medicine

## 2016-10-05 VITALS — BP 130/78 | HR 60 | Ht 63.0 in | Wt 169.6 lb

## 2016-10-05 DIAGNOSIS — R001 Bradycardia, unspecified: Secondary | ICD-10-CM

## 2016-10-05 NOTE — Patient Instructions (Addendum)
Medication Instructions:  Your physician recommends that you continue on your current medications as directed. Please refer to the Current Medication list given to you today.  Labwork: None ordered.  Testing/Procedures: None ordered.  Follow-Up: Your physician wants you to follow-up in: 9 months with Dr. Lovena Le.   You will receive a reminder letter in the mail two months in advance. If you don't receive a letter, please call our office to schedule the follow-up appointment.   Remote monitoring is used to monitor your Pacemaker from home. This monitoring reduces the number of office visits required to check your device to one time per year. It allows [place] to keep an eye on the functioning of your device to ensure it is working properly. You are scheduled for a device check from home on 01/04/2017. You may send your transmission at any time that day. If you have a wireless device, the transmission will be sent automatically. After your physician reviews your transmission, you will receive a postcard with your next transmission date.    Any Other Special Instructions Will Be Listed Below (If Applicable).     If you need a refill on your cardiac medications before your next appointment, please call your pharmacy.

## 2016-10-05 NOTE — Progress Notes (Signed)
HPI [name] returns today for followup. She is a very pleasant 83 year old woman with a history of sinus node dysfunction, PVCs, and dizziness. She underwent permanent pacemaker insertion approximately 3 months ago. She continues to have intermittent dizziness. She has frequent atrial as well as ventricular ectopy. She is pacing almost all the time in the atrium. We turned her pacemaker down to 30 beats a minute temporarily today and she was atrial pacing at that rate. She does not have chest pain or shortness of breath. She is able to do her housework daily. No syncope. Allergies  Allergen Reactions  . Penicillins Rash  . Amoxicillin Other (See Comments)    Very weak Has patient had a PCN reaction causing immediate rash, facial/tongue/throat swelling, SOB or lightheadedness with hypotension: no Has patient had a PCN reaction causing severe rash involving mucus membranes or skin necrosis: no Has patient had a PCN reaction that required hospitalization: no Has patient had a PCN reaction occurring within the last 10 years: no If all of the above answers are "NO", then may proceed with Cephalosporin use.   . Diltiazem Hcl Other (See Comments)    Reaction unknown  . Sulfonamide Derivatives Other (See Comments)    Reaction unknown  . Zetia [Ezetimibe] Other (See Comments)    Weakness     Current Outpatient Prescriptions  Medication Sig Dispense Refill  . acetaminophen (TYLENOL) 500 MG tablet Take 1,000 mg by mouth every 6 (six) hours as needed for mild pain, moderate pain or headache.    . albuterol (PROVENTIL HFA;VENTOLIN HFA) 108 (90 BASE) MCG/ACT inhaler Inhale 1 puff into the lungs every 6 (six) hours as needed for wheezing or shortness of breath. 1 Inhaler 3  . alendronate (FOSAMAX) 70 MG tablet Take 70 mg by mouth every Friday.     Marland Kitchen allopurinol (ZYLOPRIM) 100 MG tablet Take 1 tablet (100 mg total) by mouth daily. 30 tablet 6  . Artificial Tear Ointment (DRY EYES OP) Place  1-2 drops into both eyes as needed (for dry eyes).    Marland Kitchen aspirin EC 81 MG tablet Take 81 mg by mouth at bedtime.     . cholecalciferol (VITAMIN D) 1000 units tablet Take 1,000 Units by mouth at bedtime.     . colchicine 0.6 MG tablet Take 1 tablet by mouth every other day.    . Ferrous Sulfate 28 MG TABS Take 28 mg by mouth at bedtime.     . Glucosamine HCl (GLUCOSAMINE PO) Take 1 tablet by mouth daily.    Marland Kitchen losartan (COZAAR) 25 MG tablet Take 1 tablet (25 mg total) by mouth daily. 90 tablet 3  . montelukast (SINGULAIR) 10 MG tablet Take 10 mg by mouth at bedtime.      . Multiple Vitamins-Minerals (MULTIVITAMIN WITH MINERALS) tablet Take 1 tablet by mouth daily.    . Omega-3 Fatty Acids (FISH OIL BURP-LESS) 1200 MG CAPS Take 1 capsule by mouth at bedtime.     . pravastatin (PRAVACHOL) 20 MG tablet Take 20 mg by mouth at bedtime.     . ranitidine (ZANTAC) 150 MG tablet Take 1 tablet by mouth at bedtime.    . sertraline (ZOLOFT) 50 MG tablet Take 50 mg by mouth every evening.     . vitamin E 400 UNIT capsule Take 400 Units by mouth at bedtime.      No current facility-administered medications for this visit.      Past Medical History:  Diagnosis Date  .  Anemia    "I stay anemic"  . Arthritis   . Asthma    no problems recent  . Cataract   . Chronic diastolic CHF (congestive heart failure), NYHA class 2 ([place]) 03/19/2014  . CKD (chronic kidney disease), stage III   . Depression   . Diverticulitis   . DVT (deep venous thrombosis) ([employer])    from birth control, left groin  . Essential hypertension   . Fibromyalgia 10 y.a.  . GERD (gastroesophageal reflux disease)   . Gout   . Hx of cardiovascular stress test    Lexiscan Myoview (8/15): apical attenuation artifact, no ischemia, EF 69% - low risk.  Marland Kitchen Hypercholesteremia   . Odynophagia   . Other abnormal glucose   . PAF (paroxysmal atrial fibrillation) ([place])   . Personal history of other diseases of digestive system   . Pneumonia    hx  of  . Sinus bradycardia   . Skin cancer 2014   skin R elbow & face  . Symptomatic bradycardia 06/2016    ROS:   All systems reviewed and negative except as noted in the HPI.   Past Surgical History:  Procedure Laterality Date  . ABDOMINAL HYSTERECTOMY     partial  . APPENDECTOMY  ~55years ago  . CHOLECYSTECTOMY  10/2005   Dr. Effie Shy  . COLONOSCOPY W/ BIOPSIES  2005, 2011   Dr. Bing Plume, "balloon inside for last one at [facility]"  . KNEE ARTHROSCOPY Right   . LAMINECTOMY  05/2007   foraminotomy, L4-5 laminectomy  . LARYNGOSCOPY / BRONCHOSCOPY / ESOPHAGOSCOPY  2010   Dr. Benjamine Mola  . PACEMAKER IMPLANT N/A 07/01/2016   Procedure: Pacemaker Implant;  Surgeon: Evans Lance, MD;  Location: [facility];  Service: Cardiovascular;  Laterality: N/A;  . SKIN CANCER EXCISION Right ~2005   r elbow  . TOTAL KNEE ARTHROPLASTY Left 04/04/2013   Procedure: LEFT TOTAL KNEE ARTHROPLASTY;  Surgeon: Tobi Bastos, MD;  Location: [facility];  Service: Orthopedics;  Laterality: Left;  . TUBAL LIGATION    . TUMOR REMOVAL Right ~ 20 years ago   tumor in between toes     Family History  Problem Relation Age of Onset  . Heart disease Mother   . Hypertension Mother   . Fainting Mother   . Arrhythmia Mother   . Diabetes Father   . Stroke Father   . Hypertension Father   . Cancer Brother   . Sudden death Brother   . Heart attack Brother   . Cancer Sister   . Diabetes Sister   . Asthma Sister      Social History   Social History  . Marital status: Widowed    Spouse name: N/A  . Number of children: N/A  . Years of education: N/A   Occupational History  . Not on file.   Social History Main Topics  . Smoking status: Former Smoker    Years: 10.00    Types: Cigarettes    Quit date: 02/08/1973  . Smokeless tobacco: Never Used  . Alcohol use No  . Drug use: No  . Sexual activity: No   Other Topics Concern  . Not on file   Social History Narrative  . No narrative on  file     BP 130/78   Pulse 60   Ht 5\' 3"  (1.6 m)   Wt 169 lb 9.6 oz (76.9 kg)   SpO2 96%   BMI 30.04 kg/m   Physical Exam:  Well appearing Elderly woman, NAD HEENT: Unremarkable Neck:  6 cm JVD, no thyromegally Lymphatics:  No adenopathy Back:  No CVA tenderness Lungs:  Clear, with no wheezes, rales, or rhonchi. Well-healed pacemaker incision HEART:  Regular rate rhythm, no murmurs, no rubs, no clicks Abd:  soft, positive bowel sounds, no organomegally, no rebound, no guarding Ext:  2 plus pulses, no edema, no cyanosis, no clubbing Skin:  No rashes no nodules Neuro:  CN II through XII intact, motor grossly intact  EKG - normal sinus rhythm with atrial pacing  DEVICE  Normal device function.  See PaceArt for details.   Assess/Plan: 1. Sinus node dysfunction - she is doing well status post pacemaker insertion. Her intrinsic heart rate today is less than 30 bpm. 2. Pacemaker - her [place] pacemaker is working normally. Plan to recheck in several months 3. Palpitations she is minimally symptomatic. She does have frequent ventricular ectopy. 4. Hypertension - her blood pressure is well controlled today. No change in medications she is encouraged to maintain a low-sodium diet 5. Dizziness - today she complains of being dizzy despite a normal blood pressure and heart rate. Her dizziness is clearly related to a neurological condition. It is not related to hypotension or bradycardia. I have recommended that she follow-up with her medical doctor to consider whether additional evaluation would be reasonable.  Cristopher Peru, M.D.

## 2016-10-07 LAB — CUP PACEART INCLINIC DEVICE CHECK
Date Time Interrogation Session: 20180828040000
Implantable Lead Location: 753860
Implantable Lead Model: 7740
Implantable Lead Model: 7741
Implantable Lead Serial Number: 861246
Implantable Pulse Generator Implant Date: 20180524
Lead Channel Impedance Value: 627 Ohm
Lead Channel Impedance Value: 660 Ohm
Lead Channel Pacing Threshold Amplitude: 0.7 V
Lead Channel Pacing Threshold Pulse Width: 0.4 ms
MDC IDC LEAD IMPLANT DT: 20180524
MDC IDC LEAD IMPLANT DT: 20180524
MDC IDC LEAD LOCATION: 753859
MDC IDC LEAD SERIAL: 693708
MDC IDC MSMT LEADCHNL RV PACING THRESHOLD AMPLITUDE: 0.8 V
MDC IDC MSMT LEADCHNL RV PACING THRESHOLD PULSEWIDTH: 0.4 ms
MDC IDC MSMT LEADCHNL RV SENSING INTR AMPL: 7 mV
MDC IDC SET LEADCHNL RA PACING AMPLITUDE: 2 V
MDC IDC SET LEADCHNL RV PACING AMPLITUDE: 2.4 V
MDC IDC SET LEADCHNL RV PACING PULSEWIDTH: 0.4 ms
MDC IDC SET LEADCHNL RV SENSING SENSITIVITY: 2 mV
Pulse Gen Serial Number: 308044

## 2016-11-16 DIAGNOSIS — J309 Allergic rhinitis, unspecified: Secondary | ICD-10-CM | POA: Diagnosis not present

## 2016-11-16 DIAGNOSIS — J0111 Acute recurrent frontal sinusitis: Secondary | ICD-10-CM | POA: Diagnosis not present

## 2016-11-30 DIAGNOSIS — Z23 Encounter for immunization: Secondary | ICD-10-CM | POA: Diagnosis not present

## 2016-12-28 DIAGNOSIS — R1032 Left lower quadrant pain: Secondary | ICD-10-CM | POA: Diagnosis not present

## 2016-12-28 DIAGNOSIS — M8008XS Age-related osteoporosis with current pathological fracture, vertebra(e), sequela: Secondary | ICD-10-CM | POA: Diagnosis not present

## 2016-12-28 DIAGNOSIS — I129 Hypertensive chronic kidney disease with stage 1 through stage 4 chronic kidney disease, or unspecified chronic kidney disease: Secondary | ICD-10-CM | POA: Diagnosis not present

## 2016-12-28 DIAGNOSIS — K219 Gastro-esophageal reflux disease without esophagitis: Secondary | ICD-10-CM | POA: Diagnosis not present

## 2017-01-04 ENCOUNTER — Ambulatory Visit (INDEPENDENT_AMBULATORY_CARE_PROVIDER_SITE_OTHER): Payer: Medicare Other | Admitting: *Deleted

## 2017-01-04 DIAGNOSIS — I495 Sick sinus syndrome: Secondary | ICD-10-CM | POA: Diagnosis not present

## 2017-01-05 NOTE — Progress Notes (Signed)
Remote pacemaker transmission.   

## 2017-01-07 ENCOUNTER — Encounter: Payer: Self-pay | Admitting: Cardiology

## 2017-01-11 DIAGNOSIS — I10 Essential (primary) hypertension: Secondary | ICD-10-CM | POA: Diagnosis not present

## 2017-01-11 DIAGNOSIS — I5032 Chronic diastolic (congestive) heart failure: Secondary | ICD-10-CM | POA: Diagnosis not present

## 2017-01-11 DIAGNOSIS — K582 Mixed irritable bowel syndrome: Secondary | ICD-10-CM | POA: Diagnosis not present

## 2017-01-14 ENCOUNTER — Inpatient Hospital Stay (HOSPITAL_COMMUNITY)
Admission: EM | Admit: 2017-01-14 | Discharge: 2017-01-18 | DRG: 194 | Disposition: A | Discharge status: Home / Self Care | Payer: Medicare Other | Attending: Family Medicine | Admitting: Family Medicine

## 2017-01-14 ENCOUNTER — Other Ambulatory Visit: Payer: Self-pay

## 2017-01-14 ENCOUNTER — Emergency Department (HOSPITAL_COMMUNITY): Payer: Medicare Other

## 2017-01-14 DIAGNOSIS — Z950 Presence of cardiac pacemaker: Secondary | ICD-10-CM | POA: Diagnosis not present

## 2017-01-14 DIAGNOSIS — Z96652 Presence of left artificial knee joint: Secondary | ICD-10-CM | POA: Diagnosis present

## 2017-01-14 DIAGNOSIS — J45909 Unspecified asthma, uncomplicated: Secondary | ICD-10-CM | POA: Diagnosis present

## 2017-01-14 DIAGNOSIS — D649 Anemia, unspecified: Secondary | ICD-10-CM | POA: Diagnosis not present

## 2017-01-14 DIAGNOSIS — Z87891 Personal history of nicotine dependence: Secondary | ICD-10-CM | POA: Diagnosis not present

## 2017-01-14 DIAGNOSIS — M797 Fibromyalgia: Secondary | ICD-10-CM | POA: Diagnosis present

## 2017-01-14 DIAGNOSIS — N183 Chronic kidney disease, stage 3 unspecified: Secondary | ICD-10-CM | POA: Diagnosis present

## 2017-01-14 DIAGNOSIS — F32A Depression, unspecified: Secondary | ICD-10-CM | POA: Diagnosis present

## 2017-01-14 DIAGNOSIS — R0902 Hypoxemia: Secondary | ICD-10-CM | POA: Diagnosis not present

## 2017-01-14 DIAGNOSIS — N179 Acute kidney failure, unspecified: Secondary | ICD-10-CM | POA: Diagnosis not present

## 2017-01-14 DIAGNOSIS — I480 Paroxysmal atrial fibrillation: Secondary | ICD-10-CM | POA: Diagnosis not present

## 2017-01-14 DIAGNOSIS — I5032 Chronic diastolic (congestive) heart failure: Secondary | ICD-10-CM | POA: Diagnosis not present

## 2017-01-14 DIAGNOSIS — K219 Gastro-esophageal reflux disease without esophagitis: Secondary | ICD-10-CM | POA: Diagnosis present

## 2017-01-14 DIAGNOSIS — I272 Pulmonary hypertension, unspecified: Secondary | ICD-10-CM | POA: Diagnosis not present

## 2017-01-14 DIAGNOSIS — E7800 Pure hypercholesterolemia, unspecified: Secondary | ICD-10-CM | POA: Diagnosis not present

## 2017-01-14 DIAGNOSIS — R001 Bradycardia, unspecified: Secondary | ICD-10-CM | POA: Diagnosis not present

## 2017-01-14 DIAGNOSIS — R0602 Shortness of breath: Secondary | ICD-10-CM | POA: Diagnosis not present

## 2017-01-14 DIAGNOSIS — N1832 Chronic kidney disease, stage 3b: Secondary | ICD-10-CM | POA: Diagnosis present

## 2017-01-14 DIAGNOSIS — J189 Pneumonia, unspecified organism: Principal | ICD-10-CM | POA: Diagnosis present

## 2017-01-14 DIAGNOSIS — I10 Essential (primary) hypertension: Secondary | ICD-10-CM | POA: Diagnosis present

## 2017-01-14 DIAGNOSIS — Z7982 Long term (current) use of aspirin: Secondary | ICD-10-CM | POA: Diagnosis not present

## 2017-01-14 DIAGNOSIS — E785 Hyperlipidemia, unspecified: Secondary | ICD-10-CM | POA: Diagnosis not present

## 2017-01-14 DIAGNOSIS — Z8249 Family history of ischemic heart disease and other diseases of the circulatory system: Secondary | ICD-10-CM

## 2017-01-14 DIAGNOSIS — I130 Hypertensive heart and chronic kidney disease with heart failure and stage 1 through stage 4 chronic kidney disease, or unspecified chronic kidney disease: Secondary | ICD-10-CM | POA: Diagnosis present

## 2017-01-14 DIAGNOSIS — D696 Thrombocytopenia, unspecified: Secondary | ICD-10-CM | POA: Diagnosis present

## 2017-01-14 DIAGNOSIS — Z833 Family history of diabetes mellitus: Secondary | ICD-10-CM | POA: Diagnosis not present

## 2017-01-14 DIAGNOSIS — R05 Cough: Secondary | ICD-10-CM | POA: Diagnosis not present

## 2017-01-14 DIAGNOSIS — J181 Lobar pneumonia, unspecified organism: Secondary | ICD-10-CM

## 2017-01-14 DIAGNOSIS — F329 Major depressive disorder, single episode, unspecified: Secondary | ICD-10-CM | POA: Diagnosis present

## 2017-01-14 LAB — CBC WITH DIFFERENTIAL/PLATELET
BASOS PCT: 0 %
Basophils Absolute: 0 10*3/uL (ref 0.0–0.1)
EOS ABS: 0.1 10*3/uL (ref 0.0–0.7)
Eosinophils Relative: 1 %
HCT: 31.5 % — ABNORMAL LOW (ref 36.0–46.0)
Hemoglobin: 10.2 g/dL — ABNORMAL LOW (ref 12.0–15.0)
LYMPHS ABS: 0.8 10*3/uL (ref 0.7–4.0)
Lymphocytes Relative: 12 %
MCH: 31 pg (ref 26.0–34.0)
MCHC: 32.4 g/dL (ref 30.0–36.0)
MCV: 95.7 fL (ref 78.0–100.0)
MONO ABS: 0.2 10*3/uL (ref 0.1–1.0)
MONOS PCT: 3 %
Neutro Abs: 5.3 10*3/uL (ref 1.7–7.7)
Neutrophils Relative %: 84 %
Platelets: 108 10*3/uL — ABNORMAL LOW (ref 150–400)
RBC: 3.29 MIL/uL — ABNORMAL LOW (ref 3.87–5.11)
RDW: 12.9 % (ref 11.5–15.5)
WBC: 6.3 10*3/uL (ref 4.0–10.5)

## 2017-01-14 LAB — COMPREHENSIVE METABOLIC PANEL
ALBUMIN: 4 g/dL (ref 3.5–5.0)
ALT: 16 U/L (ref 14–54)
ANION GAP: 12 (ref 5–15)
AST: 24 U/L (ref 15–41)
Alkaline Phosphatase: 86 U/L (ref 38–126)
BILIRUBIN TOTAL: 0.6 mg/dL (ref 0.3–1.2)
BUN: 23 mg/dL — ABNORMAL HIGH (ref 6–20)
CALCIUM: 8.8 mg/dL — AB (ref 8.9–10.3)
CO2: 22 mmol/L (ref 22–32)
Chloride: 104 mmol/L (ref 101–111)
Creatinine, Ser: 1.41 mg/dL — ABNORMAL HIGH (ref 0.44–1.00)
GFR calc non Af Amer: 32 mL/min — ABNORMAL LOW (ref >60)
GFR, EST AFRICAN AMERICAN: 38 mL/min — AB (ref >60)
GLUCOSE: 189 mg/dL — AB (ref 65–99)
POTASSIUM: 3.5 mmol/L (ref 3.5–5.1)
SODIUM: 138 mmol/L (ref 135–145)
TOTAL PROTEIN: 6.1 g/dL — AB (ref 6.5–8.1)

## 2017-01-14 MED ORDER — LEVOFLOXACIN IN D5W 500 MG/100ML IV SOLN
500.0000 mg | Freq: Once | INTRAVENOUS | Status: AC
Start: 2017-01-14 — End: 2017-01-15
  Administered 2017-01-14: 500 mg via INTRAVENOUS
  Filled 2017-01-14: qty 100

## 2017-01-14 MED ORDER — ALBUTEROL SULFATE (2.5 MG/3ML) 0.083% IN NEBU
2.5000 mg | INHALATION_SOLUTION | Freq: Once | RESPIRATORY_TRACT | Status: AC
  Administered 2017-01-14: 2.5 mg via RESPIRATORY_TRACT
  Filled 2017-01-14: qty 3

## 2017-01-14 MED ORDER — METHYLPREDNISOLONE SODIUM SUCC 125 MG IJ SOLR: 125.0000 mg | Freq: Once | INTRAMUSCULAR | Status: DC

## 2017-01-14 MED ORDER — IPRATROPIUM-ALBUTEROL 0.5-2.5 (3) MG/3ML IN SOLN
3.0000 mL | Freq: Once | RESPIRATORY_TRACT | Status: AC
  Administered 2017-01-14: 3 mL via RESPIRATORY_TRACT
  Filled 2017-01-14: qty 3

## 2017-01-14 NOTE — ED Triage Notes (Signed)
Pt BIB EMS from PCP for SOB. Per EMS, pt has had SOB and productive cough for several days, went to PCP for same. O2 sats 91% on RA at that time, given 2.5 albuterol. EMS started pt on 5 albuterol and 0.5 atrovent, and gave 125 mg solumedrol. Per EMS, at home pt accidentally used steroid inhaler instead of rescue inhaler. Pt still on breathing tx at this time. Resp e/u. NAD.

## 2017-01-14 NOTE — ED Notes (Signed)
Family at bedside. 

## 2017-01-14 NOTE — ED Provider Notes (Addendum)
[facility] EMERGENCY DEPARTMENT Provider Note   CSN: [id number] Arrival date & time: 01/14/17  1853     History   Chief Complaint Chief Complaint  Patient presents with  . Shortness of Breath    HPI [name] is a 83 y.o. female.  Patient complains of cough shortness of breath for a month.  She has been on antibiotics once.  Shortness of breath and cough became worse in the last couple days.   The history is provided by the patient. No language interpreter was used.  Shortness of Breath  This is a recurrent problem. The problem occurs continuously.The current episode started more than 1 week ago. The problem has not changed since onset.Associated symptoms include a fever. Pertinent negatives include no headaches, no cough, no chest pain, no abdominal pain and no rash.    Past Medical History:  Diagnosis Date  . Anemia    "I stay anemic"  . Arthritis   . Asthma    no problems recent  . Cataract   . Chronic diastolic CHF (congestive heart failure), NYHA class 2 ([place]) 03/19/2014  . CKD (chronic kidney disease), stage III   . Depression   . Diverticulitis   . DVT (deep venous thrombosis) ([place])    from birth control, left groin  . Essential hypertension   . Fibromyalgia 10 y.a.  . GERD (gastroesophageal reflux disease)   . Gout   . Hx of cardiovascular stress test    Lexiscan Myoview (8/15): apical attenuation artifact, no ischemia, EF 69% - low risk.  Marland Kitchen Hypercholesteremia   . Odynophagia   . Other abnormal glucose   . PAF (paroxysmal atrial fibrillation) ([place])   . Personal history of other diseases of digestive system   . Pneumonia    hx of  . Sinus bradycardia   . Skin cancer 2014   skin R elbow & face  . Symptomatic bradycardia 06/2016    Patient Active Problem List   Diagnosis Date Noted  . Symptomatic bradycardia 06/30/2016  . Hypertensive heart disease 04/01/2016  . Bradycardia 04/01/2016  . Fall 04/01/2016  . Sinus  bradycardia   . CKD (chronic kidney disease), stage III ([place])   . Anemia   . Essential hypertension   . PAF (paroxysmal atrial fibrillation) ([place])   . Palpitations 09/18/2014  . DOE (dyspnea on exertion) 03/19/2014  . Chronic diastolic CHF (congestive heart failure), NYHA class 2 ([place]) 03/19/2014  . Fatigue 09/11/2013  . Gastroenteritis 07/20/2013  . Abdominal pain 07/20/2013  . Abnormal x-ray of abdomen 07/20/2013  . Diarrhea 07/20/2013  . Generalized weakness 07/20/2013  . Obesity (BMI 30-39.9) 07/20/2013  . Hypercholesteremia   . GERD (gastroesophageal reflux disease)   . Depression   . Asthma   . Gout   . Acute blood loss anemia 04/05/2013  . Osteoarthritis of left knee 04/04/2013  . Total knee replacement status 04/04/2013  . Pulmonary hypertension ([place]) 03/07/2013  . Anemia, unspecified 03/19/2012  . Unspecified deficiency anemia 03/19/2012  . Dizziness 07/09/2010  . CHEST PAIN UNSPECIFIED 07/30/2009  . HYPERTENSION, UNSPECIFIED 08/19/2008  . PAROXYSMAL ATRIAL FIBRILLATION 08/19/2008  . GASTROESOPHAGEAL REFLUX DISEASE, HX OF 08/19/2008    Past Surgical History:  Procedure Laterality Date  . ABDOMINAL HYSTERECTOMY     partial  . APPENDECTOMY  ~55years ago  . CHOLECYSTECTOMY  10/2005   Dr. Effie Shy  . COLONOSCOPY W/ BIOPSIES  2005, 2011   Dr. Bing Plume, "balloon inside for last one at [facility]"  .  KNEE ARTHROSCOPY Right   . LAMINECTOMY  05/2007   foraminotomy, L4-5 laminectomy  . LARYNGOSCOPY / BRONCHOSCOPY / ESOPHAGOSCOPY  2010   Dr. Benjamine Mola  . PACEMAKER IMPLANT N/A 07/01/2016   Procedure: Pacemaker Implant;  Surgeon: Evans Lance, MD;  Location: [facility];  Service: Cardiovascular;  Laterality: N/A;  . SKIN CANCER EXCISION Right ~2005   r elbow  . TOTAL KNEE ARTHROPLASTY Left 04/04/2013   Procedure: LEFT TOTAL KNEE ARTHROPLASTY;  Surgeon: Tobi Bastos, MD;  Location: [facility];  Service: Orthopedics;  Laterality: Left;  . TUBAL LIGATION    .  TUMOR REMOVAL Right ~ 20 years ago   tumor in between toes    OB History    No data available       Home Medications    Prior to Admission medications   Medication Sig Start Date End Date Taking? Authorizing Provider  acetaminophen (TYLENOL) 500 MG tablet Take 1,000 mg by mouth every 6 (six) hours as needed for mild pain, moderate pain or headache.    [provider]  albuterol (PROVENTIL HFA;VENTOLIN HFA) 108 (90 BASE) MCG/ACT inhaler Inhale 1 puff into the lungs every 6 (six) hours as needed for wheezing or shortness of breath. 11/13/14   Dorothy Spark, MD  alendronate (FOSAMAX) 70 MG tablet Take 70 mg by mouth every Friday.  02/13/14   [provider]  allopurinol (ZYLOPRIM) 100 MG tablet Take 1 tablet (100 mg total) by mouth daily. 04/01/16   Veatrice Bourbon, MD  Artificial Tear Ointment (DRY EYES OP) Place 1-2 drops into both eyes as needed (for dry eyes).    [provider]  aspirin EC 81 MG tablet Take 81 mg by mouth at bedtime.     [provider]  cholecalciferol (VITAMIN D) 1000 units tablet Take 1,000 Units by mouth at bedtime.     [provider]  colchicine 0.6 MG tablet Take 1 tablet by mouth every other day. 07/26/16   [provider]  Ferrous Sulfate 28 MG TABS Take 28 mg by mouth at bedtime.     [provider]  Glucosamine HCl (GLUCOSAMINE PO) Take 1 tablet by mouth daily.    [provider]  losartan (COZAAR) 25 MG tablet Take 1 tablet (25 mg total) by mouth daily. 04/01/16 10/05/16  Dorothy Spark, MD  montelukast (SINGULAIR) 10 MG tablet Take 10 mg by mouth at bedtime.      [provider]  Multiple Vitamins-Minerals (MULTIVITAMIN WITH MINERALS) tablet Take 1 tablet by mouth daily.    [provider]  Omega-3 Fatty Acids (FISH OIL BURP-LESS) 1200 MG CAPS Take 1 capsule by mouth at bedtime.     [provider]  pravastatin (PRAVACHOL) 20 MG tablet Take 20 mg by mouth at  bedtime.  09/17/14   [provider]  ranitidine (ZANTAC) 150 MG tablet Take 1 tablet by mouth at bedtime. 09/24/16   [provider]  sertraline (ZOLOFT) 50 MG tablet Take 50 mg by mouth every evening.  03/15/12   [provider]  vitamin E 400 UNIT capsule Take 400 Units by mouth at bedtime.     [provider]    Family History Family History  Problem Relation Age of Onset  . Heart disease Mother   . Hypertension Mother   . Fainting Mother   . Arrhythmia Mother   . Diabetes Father   . Stroke Father   . Hypertension Father   .  Cancer Brother   . Sudden death Brother   . Heart attack Brother   . Cancer Sister   . Diabetes Sister   . Asthma Sister     Social History Social History   Tobacco Use  . Smoking status: Former Smoker    Years: 10.00    Types: Cigarettes    Last attempt to quit: 02/08/1973    Years since quitting: 43.9  . Smokeless tobacco: Never Used  Substance Use Topics  . Alcohol use: No  . Drug use: No     Allergies   Penicillins; Amoxicillin; Diltiazem hcl; Sulfonamide derivatives; and Zetia [ezetimibe]   Review of Systems Review of Systems  Constitutional: Positive for fever. Negative for appetite change and fatigue.  HENT: Negative for congestion, ear discharge and sinus pressure.   Eyes: Negative for discharge.  Respiratory: Positive for shortness of breath. Negative for cough.   Cardiovascular: Negative for chest pain.  Gastrointestinal: Negative for abdominal pain and diarrhea.  Genitourinary: Negative for frequency and hematuria.  Musculoskeletal: Negative for back pain.  Skin: Negative for rash.  Neurological: Negative for seizures and headaches.  Psychiatric/Behavioral: Negative for hallucinations.     Physical Exam Updated Vital Signs BP 138/60   Pulse (!) 104   Temp 99.4 F (37.4 C) (Oral)   Resp (!) 23   Ht 5\' 3"  (1.6 m)   Wt 77.1 kg (170 lb)   SpO2 96%   BMI 30.11 kg/m   Physical Exam    Constitutional: She is oriented to person, place, and time. She appears well-developed.  HENT:  Head: Normocephalic.  Eyes: Conjunctivae and EOM are normal. No scleral icterus.  Neck: Neck supple. No thyromegaly present.  Cardiovascular: Normal rate and regular rhythm. Exam reveals no gallop and no friction rub.  No murmur heard. Pulmonary/Chest: No stridor. She has no wheezes. She has no rales. She exhibits no tenderness.  Abdominal: She exhibits no distension. There is no tenderness. There is no rebound.  Musculoskeletal: Normal range of motion. She exhibits no edema.  Lymphadenopathy:    She has no cervical adenopathy.  Neurological: She is oriented to person, place, and time. She exhibits normal muscle tone. Coordination normal.  Skin: No rash noted. No erythema.  Psychiatric: She has a normal mood and affect. Her behavior is normal.     ED Treatments / Results  Labs (all labs ordered are listed, but only abnormal results are displayed) Labs Reviewed  CBC WITH DIFFERENTIAL/PLATELET - Abnormal; Notable for the following components:      Result Value   RBC 3.29 (*)    Hemoglobin 10.2 (*)    HCT 31.5 (*)    Platelets 108 (*)    All other components within normal limits  COMPREHENSIVE METABOLIC PANEL - Abnormal; Notable for the following components:   Glucose, Bld 189 (*)    BUN 23 (*)    Creatinine, Ser 1.41 (*)    Calcium 8.8 (*)    Total Protein 6.1 (*)    GFR calc non Af Amer 32 (*)    GFR calc Af Amer 38 (*)    All other components within normal limits  CULTURE, BLOOD (ROUTINE X 2)  CULTURE, BLOOD (ROUTINE X 2)    EKG  EKG Interpretation None       Radiology Dg Chest 2 View  Result Date: 01/14/2017 CLINICAL DATA:  Shortness breath. Productive cough. Hypertension and pneumonia. EXAM: CHEST  2 VIEW COMPARISON:  Two-view chest x-ray 09/12/2016 FINDINGS: Cardiomegaly is again  noted. Bilateral lower lobe pneumonia is present. Moderate pulmonary vascular  congestion is evident. The upper lung fields are clear. The visualized soft tissues and bony thorax are unremarkable. Exaggerated thoracolumbar kyphosis is again seen. A remote T12 fracture is noted. IMPRESSION: 1. Bilateral lower lobar pneumonia. 2. Cardiomegaly with increased pulmonary vascular congestion. Electronically Signed   By: San Morelle M.D.   On: 01/14/2017 20:10    Procedures Procedures (including critical care time)  Medications Ordered in ED Medications  methylPREDNISolone sodium succinate (SOLU-MEDROL) 125 mg/2 mL injection 125 mg (125 mg Intravenous Not Given 01/14/17 2030)  levofloxacin (LEVAQUIN) IVPB 500 mg (not administered)  ipratropium-albuterol (DUONEB) 0.5-2.5 (3) MG/3ML nebulizer solution 3 mL (3 mLs Nebulization Given 01/14/17 2025)  albuterol (PROVENTIL) (2.5 MG/3ML) 0.083% nebulizer solution 2.5 mg (2.5 mg Nebulization Given 01/14/17 2025)     Initial Impression / Assessment and Plan / ED Course  I have reviewed the triage vital signs and the nursing notes.  Pertinent labs & imaging results that were available during my care of the patient were reviewed by me and considered in my medical decision making (see chart for details).    Patient with bilateral pneumonia.  She will be admitted to medicine  Final Clinical Impressions(s) / ED Diagnoses   Final diagnoses:  Community acquired pneumonia of left lower lobe of lung [facility])    ED Discharge Orders    None       [name] Ferguson, MD 01/14/17 2244    [name] Ferguson, MD 01/25/17 [phone]

## 2017-01-14 NOTE — ED Notes (Signed)
The pt is asking for  Food. given

## 2017-01-14 NOTE — ED Notes (Signed)
Patient transported to X-ray 

## 2017-01-15 ENCOUNTER — Encounter (HOSPITAL_COMMUNITY): Payer: Self-pay

## 2017-01-15 DIAGNOSIS — R001 Bradycardia, unspecified: Secondary | ICD-10-CM

## 2017-01-15 DIAGNOSIS — E785 Hyperlipidemia, unspecified: Secondary | ICD-10-CM | POA: Diagnosis present

## 2017-01-15 DIAGNOSIS — J189 Pneumonia, unspecified organism: Secondary | ICD-10-CM | POA: Diagnosis not present

## 2017-01-15 DIAGNOSIS — D649 Anemia, unspecified: Secondary | ICD-10-CM | POA: Diagnosis not present

## 2017-01-15 DIAGNOSIS — Z87891 Personal history of nicotine dependence: Secondary | ICD-10-CM | POA: Diagnosis not present

## 2017-01-15 DIAGNOSIS — N183 Chronic kidney disease, stage 3 (moderate): Secondary | ICD-10-CM

## 2017-01-15 DIAGNOSIS — I130 Hypertensive heart and chronic kidney disease with heart failure and stage 1 through stage 4 chronic kidney disease, or unspecified chronic kidney disease: Secondary | ICD-10-CM | POA: Diagnosis present

## 2017-01-15 DIAGNOSIS — F329 Major depressive disorder, single episode, unspecified: Secondary | ICD-10-CM | POA: Diagnosis present

## 2017-01-15 DIAGNOSIS — I480 Paroxysmal atrial fibrillation: Secondary | ICD-10-CM | POA: Diagnosis present

## 2017-01-15 DIAGNOSIS — Z833 Family history of diabetes mellitus: Secondary | ICD-10-CM | POA: Diagnosis not present

## 2017-01-15 DIAGNOSIS — Z950 Presence of cardiac pacemaker: Secondary | ICD-10-CM | POA: Diagnosis not present

## 2017-01-15 DIAGNOSIS — D696 Thrombocytopenia, unspecified: Secondary | ICD-10-CM | POA: Diagnosis present

## 2017-01-15 DIAGNOSIS — I272 Pulmonary hypertension, unspecified: Secondary | ICD-10-CM | POA: Diagnosis present

## 2017-01-15 DIAGNOSIS — Z96652 Presence of left artificial knee joint: Secondary | ICD-10-CM | POA: Diagnosis present

## 2017-01-15 DIAGNOSIS — E7800 Pure hypercholesterolemia, unspecified: Secondary | ICD-10-CM | POA: Diagnosis present

## 2017-01-15 DIAGNOSIS — I10 Essential (primary) hypertension: Secondary | ICD-10-CM

## 2017-01-15 DIAGNOSIS — K219 Gastro-esophageal reflux disease without esophagitis: Secondary | ICD-10-CM | POA: Diagnosis present

## 2017-01-15 DIAGNOSIS — N179 Acute kidney failure, unspecified: Secondary | ICD-10-CM | POA: Diagnosis present

## 2017-01-15 DIAGNOSIS — R0602 Shortness of breath: Secondary | ICD-10-CM | POA: Diagnosis present

## 2017-01-15 DIAGNOSIS — I5032 Chronic diastolic (congestive) heart failure: Secondary | ICD-10-CM | POA: Diagnosis present

## 2017-01-15 DIAGNOSIS — Z7982 Long term (current) use of aspirin: Secondary | ICD-10-CM | POA: Diagnosis not present

## 2017-01-15 DIAGNOSIS — J45909 Unspecified asthma, uncomplicated: Secondary | ICD-10-CM | POA: Diagnosis present

## 2017-01-15 DIAGNOSIS — J9811 Atelectasis: Secondary | ICD-10-CM | POA: Diagnosis not present

## 2017-01-15 DIAGNOSIS — M797 Fibromyalgia: Secondary | ICD-10-CM | POA: Diagnosis present

## 2017-01-15 DIAGNOSIS — Z8249 Family history of ischemic heart disease and other diseases of the circulatory system: Secondary | ICD-10-CM | POA: Diagnosis not present

## 2017-01-15 LAB — CBC WITH DIFFERENTIAL/PLATELET
BASOS ABS: 0 10*3/uL (ref 0.0–0.1)
BASOS PCT: 0 %
EOS ABS: 0 10*3/uL (ref 0.0–0.7)
Eosinophils Relative: 0 %
HEMATOCRIT: 30.7 % — AB (ref 36.0–46.0)
Hemoglobin: 9.8 g/dL — ABNORMAL LOW (ref 12.0–15.0)
Lymphocytes Relative: 4 %
Lymphs Abs: 0.2 10*3/uL — ABNORMAL LOW (ref 0.7–4.0)
MCH: 30.9 pg (ref 26.0–34.0)
MCHC: 31.9 g/dL (ref 30.0–36.0)
MCV: 96.8 fL (ref 78.0–100.0)
MONO ABS: 0.1 10*3/uL (ref 0.1–1.0)
Monocytes Relative: 1 %
NEUTROS ABS: 5.7 10*3/uL (ref 1.7–7.7)
NEUTROS PCT: 95 %
Platelets: 118 10*3/uL — ABNORMAL LOW (ref 150–400)
RBC: 3.17 MIL/uL — ABNORMAL LOW (ref 3.87–5.11)
RDW: 13.1 % (ref 11.5–15.5)
WBC: 6 10*3/uL (ref 4.0–10.5)

## 2017-01-15 LAB — BASIC METABOLIC PANEL
ANION GAP: 14 (ref 5–15)
BUN: 27 mg/dL — ABNORMAL HIGH (ref 6–20)
CALCIUM: 8.6 mg/dL — AB (ref 8.9–10.3)
CO2: 19 mmol/L — ABNORMAL LOW (ref 22–32)
CREATININE: 1.6 mg/dL — AB (ref 0.44–1.00)
Chloride: 104 mmol/L (ref 101–111)
GFR, EST AFRICAN AMERICAN: 32 mL/min — AB (ref >60)
GFR, EST NON AFRICAN AMERICAN: 28 mL/min — AB (ref >60)
GLUCOSE: 299 mg/dL — AB (ref 65–99)
Potassium: 4 mmol/L (ref 3.5–5.1)
Sodium: 137 mmol/L (ref 135–145)

## 2017-01-15 LAB — STREP PNEUMONIAE URINARY ANTIGEN: STREP PNEUMO URINARY ANTIGEN: NEGATIVE

## 2017-01-15 LAB — HIV ANTIBODY (ROUTINE TESTING W REFLEX): HIV SCREEN 4TH GENERATION: NONREACTIVE

## 2017-01-15 MED ORDER — PRAVASTATIN SODIUM 10 MG PO TABS
20.0000 mg | ORAL_TABLET | Freq: Every day | ORAL | Status: DC
  Administered 2017-01-15 – 2017-01-17 (×3): 20 mg via ORAL
  Filled 2017-01-15 (×3): qty 2

## 2017-01-15 MED ORDER — HEPARIN SODIUM (PORCINE) 5000 UNIT/ML IJ SOLN
5000.0000 [IU] | Freq: Three times a day (TID) | INTRAMUSCULAR | Status: DC
  Administered 2017-01-15 – 2017-01-18 (×9): 5000 [IU] via SUBCUTANEOUS
  Filled 2017-01-15 (×9): qty 1

## 2017-01-15 MED ORDER — LORAZEPAM 0.5 MG PO TABS
0.5000 mg | ORAL_TABLET | Freq: Four times a day (QID) | ORAL | Status: DC | PRN
  Administered 2017-01-17: 1 mg via ORAL
  Filled 2017-01-15: qty 2

## 2017-01-15 MED ORDER — ALBUTEROL SULFATE (2.5 MG/3ML) 0.083% IN NEBU
2.5000 mg | INHALATION_SOLUTION | RESPIRATORY_TRACT | Status: DC | PRN
  Administered 2017-01-17: 2.5 mg via RESPIRATORY_TRACT
  Filled 2017-01-15: qty 3

## 2017-01-15 MED ORDER — METHYLPREDNISOLONE SODIUM SUCC 40 MG IJ SOLR
40.0000 mg | Freq: Two times a day (BID) | INTRAMUSCULAR | Status: DC
  Administered 2017-01-15 – 2017-01-16 (×3): 40 mg via INTRAVENOUS
  Filled 2017-01-15 (×3): qty 1

## 2017-01-15 MED ORDER — LOSARTAN POTASSIUM 50 MG PO TABS
25.0000 mg | ORAL_TABLET | Freq: Every evening | ORAL | Status: DC
  Administered 2017-01-15 – 2017-01-17 (×3): 25 mg via ORAL
  Filled 2017-01-15 (×3): qty 1

## 2017-01-15 MED ORDER — COLCHICINE 0.6 MG PO TABS
0.6000 mg | ORAL_TABLET | ORAL | Status: DC
  Administered 2017-01-15 – 2017-01-17 (×2): 0.6 mg via ORAL
  Filled 2017-01-15 (×2): qty 1

## 2017-01-15 MED ORDER — DEXTROSE 5 % IV SOLN
1.0000 g | Freq: Every day | INTRAVENOUS | Status: DC
  Administered 2017-01-15 – 2017-01-16 (×3): 1 g via INTRAVENOUS
  Filled 2017-01-15 (×3): qty 10

## 2017-01-15 MED ORDER — SERTRALINE HCL 50 MG PO TABS
50.0000 mg | ORAL_TABLET | Freq: Every evening | ORAL | Status: DC
  Administered 2017-01-15 – 2017-01-17 (×3): 50 mg via ORAL
  Filled 2017-01-15 (×3): qty 1

## 2017-01-15 MED ORDER — OMEGA-3-ACID ETHYL ESTERS 1 G PO CAPS
1.0000 g | ORAL_CAPSULE | Freq: Every day | ORAL | Status: DC
  Administered 2017-01-15 – 2017-01-17 (×4): 1 g via ORAL
  Filled 2017-01-15 (×5): qty 1

## 2017-01-15 MED ORDER — ADULT MULTIVITAMIN W/MINERALS CH
1.0000 | ORAL_TABLET | Freq: Every day | ORAL | Status: DC
  Administered 2017-01-15 – 2017-01-18 (×4): 1 via ORAL
  Filled 2017-01-15 (×4): qty 1

## 2017-01-15 MED ORDER — DEXTROSE 5 % IV SOLN
500.0000 mg | Freq: Every day | INTRAVENOUS | Status: DC
  Administered 2017-01-15: 500 mg via INTRAVENOUS
  Filled 2017-01-15: qty 500

## 2017-01-15 MED ORDER — MONTELUKAST SODIUM 10 MG PO TABS
10.0000 mg | ORAL_TABLET | Freq: Every day | ORAL | Status: DC
  Administered 2017-01-15 – 2017-01-17 (×4): 10 mg via ORAL
  Filled 2017-01-15 (×5): qty 1

## 2017-01-15 MED ORDER — DM-GUAIFENESIN ER 30-600 MG PO TB12
1.0000 | ORAL_TABLET | Freq: Two times a day (BID) | ORAL | Status: DC
  Administered 2017-01-15 – 2017-01-18 (×8): 1 via ORAL
  Filled 2017-01-15 (×8): qty 1

## 2017-01-15 MED ORDER — ASPIRIN EC 81 MG PO TBEC
81.0000 mg | DELAYED_RELEASE_TABLET | Freq: Every day | ORAL | Status: DC
  Administered 2017-01-15 – 2017-01-17 (×4): 81 mg via ORAL
  Filled 2017-01-15 (×4): qty 1

## 2017-01-15 MED ORDER — FAMOTIDINE 20 MG PO TABS
20.0000 mg | ORAL_TABLET | Freq: Every day | ORAL | Status: DC
  Administered 2017-01-15 – 2017-01-17 (×4): 20 mg via ORAL
  Filled 2017-01-15 (×4): qty 1

## 2017-01-15 MED ORDER — VITAMIN D 1000 UNITS PO TABS
1000.0000 [IU] | ORAL_TABLET | Freq: Every day | ORAL | Status: DC
  Administered 2017-01-15 – 2017-01-17 (×4): 1000 [IU] via ORAL
  Filled 2017-01-15 (×4): qty 1

## 2017-01-15 MED ORDER — FUROSEMIDE 10 MG/ML IJ SOLN
20.0000 mg | Freq: Once | INTRAMUSCULAR | Status: AC
  Administered 2017-01-15: 20 mg via INTRAVENOUS
  Filled 2017-01-15: qty 2

## 2017-01-15 NOTE — Progress Notes (Signed)
  74 female symp bradycardia s/p ppm 06/30/16 Prior atrial ectopy on flecainide LOw risk stess ef 69% 09/2013 Mild pulm htn Fibromyalgia gerd ckd iii htn Prior aneamia  Admitted with cough fever and likely PNA--CXR showed bilateral Lower lobe OPNA Has been sick ~ 1 mo--has had cough and rx with 1 round abx Went to her doc last Tuesday--thoguth to have sinus issues  P Cont abx as per Dr. Myna Hidalgo She is warm to touch on exam I and I feel this is more likely PNA Will r/o HF with bnp in am Will continue Abx and get CXR rpt in am [facility] without telemetry   Verneita Griffes, MD [facility] [phone]

## 2017-01-15 NOTE — Progress Notes (Signed)
Received pt alert and oriented x4. Pt on O2 at 2 L/min. Pt ambulated to bed. Vitals within normal limits. Oriented to pt's room.

## 2017-01-15 NOTE — H&P (Signed)
History and Physical    [name] MWU:[id number] DOB: 1928/02/08 DOA: 01/14/2017  PCP: [name], MD   Patient coming from: Home  Chief Complaint: SOB, productive cough  HPI: [name] is a 83 y.o. female with medical history significant for chronic kidney disease stage III, hypertension, depression, GERD, and anemia and thrombocytopenia, now presenting to the emergency department for evaluation of shortness of breath and productive cough.  Patient reports that she developed shortness of breath and a cough approximately a month ago, was evaluated by her outpatient physician for this and started on an antibiotic that she does not recall the name of.  She did not seem to have any improvement with this treatment and over the past several days has noted worsening.  She now reports being short of breath while at rest.  She continues to cough up thick yellow and green sputum.  Denies any chest pain or palpitations.  Denies fevers or chills.  ED Course: Upon arrival to the ED, patient is found to be afebrile, saturating mid 90s on 2 L/min of supplemental oxygen, slightly tachycardic, and with stable blood pressure.  EKG features a sinus rhythm with left anterior fascicular block.  Chest x-ray is notable for bilateral lower lobe consolidations concerning for pneumonia.  Chemistry panel is notable for serum creatinine 1.41 which appears consistent with her baseline.  CBC features a normocytic anemia with hemoglobin of 10.2 and a chronic thrombocytopenia with platelets 108,000.  Blood cultures were collected, 125 mg of IV Solu-Medrol was given, and the patient was treated with duo nebs and Levaquin.  Tachycardia resolved, blood pressure remains stable, patient continues to be short of breath, and she will be admitted to the medical-surgical unit for ongoing evaluation and community-acquired pneumonia.  Review of Systems:  All other systems reviewed and apart from HPI, are  negative.  Past Medical History:  Diagnosis Date  . Anemia    "I stay anemic"  . Arthritis   . Asthma    no problems recent  . Cataract   . Chronic diastolic CHF (congestive heart failure), NYHA class 2 ([place]) 03/19/2014  . CKD (chronic kidney disease), stage III   . Depression   . Diverticulitis   . DVT (deep venous thrombosis) ([place])    from birth control, left groin  . Essential hypertension   . Fibromyalgia 10 y.a.  . GERD (gastroesophageal reflux disease)   . Gout   . Hx of cardiovascular stress test    Lexiscan Myoview (8/15): apical attenuation artifact, no ischemia, EF 69% - low risk.  Marland Kitchen Hypercholesteremia   . Odynophagia   . Other abnormal glucose   . PAF (paroxysmal atrial fibrillation) ([place])   . Personal history of other diseases of digestive system   . Pneumonia    hx of  . Sinus bradycardia   . Skin cancer 2014   skin R elbow & face  . Symptomatic bradycardia 06/2016    Past Surgical History:  Procedure Laterality Date  . ABDOMINAL HYSTERECTOMY     partial  . APPENDECTOMY  ~55years ago  . CHOLECYSTECTOMY  10/2005   Dr. Effie Shy  . COLONOSCOPY W/ BIOPSIES  2005, 2011   Dr. Bing Plume, "balloon inside for last one at [facility]"  . KNEE ARTHROSCOPY Right   . LAMINECTOMY  05/2007   foraminotomy, L4-5 laminectomy  . LARYNGOSCOPY / BRONCHOSCOPY / ESOPHAGOSCOPY  2010   Dr. Benjamine Mola  . PACEMAKER IMPLANT N/A 07/01/2016   Procedure: Pacemaker Implant;  Surgeon:  Evans Lance, MD;  Location: [place] [facility];  Service: Cardiovascular;  Laterality: N/A;  . SKIN CANCER EXCISION Right ~2005   r elbow  . TOTAL KNEE ARTHROPLASTY Left 04/04/2013   Procedure: LEFT TOTAL KNEE ARTHROPLASTY;  Surgeon: Tobi Bastos, MD;  Location: [facility];  Service: Orthopedics;  Laterality: Left;  . TUBAL LIGATION    . TUMOR REMOVAL Right ~ 20 years ago   tumor in between toes     reports that she quit smoking about 43 years ago. Her smoking use included cigarettes. She quit after  10.00 years of use. she has never used smokeless tobacco. She reports that she does not drink alcohol or use drugs.  Allergies  Allergen Reactions  . Penicillins Rash  . Amoxicillin Other (See Comments)    Very weak Has patient had a PCN reaction causing immediate rash, facial/tongue/throat swelling, SOB or lightheadedness with hypotension: no Has patient had a PCN reaction causing severe rash involving mucus membranes or skin necrosis: no Has patient had a PCN reaction that required hospitalization: no Has patient had a PCN reaction occurring within the last 10 years: no If all of the above answers are "NO", then may proceed with Cephalosporin use.   . Diltiazem Hcl Other (See Comments)    Reaction unknown  . Sulfonamide Derivatives Other (See Comments)    Reaction unknown  . Zetia [Ezetimibe] Other (See Comments)    Weakness    Family History  Problem Relation Age of Onset  . Heart disease Mother   . Hypertension Mother   . Fainting Mother   . Arrhythmia Mother   . Diabetes Father   . Stroke Father   . Hypertension Father   . Cancer Brother   . Sudden death Brother   . Heart attack Brother   . Cancer Sister   . Diabetes Sister   . Asthma Sister      Prior to Admission medications   Medication Sig Start Date End Date Taking? Authorizing Provider  acetaminophen (TYLENOL) 500 MG tablet Take 1,000 mg by mouth every 6 (six) hours as needed for mild pain, moderate pain or headache.   Yes [provider]  albuterol (PROVENTIL HFA;VENTOLIN HFA) 108 (90 BASE) MCG/ACT inhaler Inhale 1 puff into the lungs every 6 (six) hours as needed for wheezing or shortness of breath. 11/13/14  Yes Dorothy Spark, MD  alendronate (FOSAMAX) 70 MG tablet Take 70 mg by mouth every Friday.  02/13/14  Yes [provider]  Artificial Tear Ointment (DRY EYES OP) Place 1-2 drops into both eyes as needed (for dry eyes).   Yes [provider]  aspirin EC 81 MG tablet Take 81  mg by mouth at bedtime.    Yes [provider]  cholecalciferol (VITAMIN D) 1000 units tablet Take 1,000 Units by mouth at bedtime.    Yes [provider]  colchicine 0.6 MG tablet Take 1 tablet by mouth every other day. 07/26/16  Yes [provider]  DM-Phenylephrine-Acetaminophen (ALKA-SELTZER PLS SINUS & COUGH) 10-5-325 MG CAPS Take 1 capsule by mouth daily as needed.   Yes [provider]  Ferrous Sulfate 28 MG TABS Take 28 mg by mouth at bedtime.    Yes [provider]  Glucosamine HCl (GLUCOSAMINE PO) Take 1 tablet by mouth daily.   Yes [provider]  ibuprofen (ADVIL,MOTRIN) 800 MG tablet Take 800 mg by mouth every 6 (six) hours as needed. 10/09/16  Yes [provider]  losartan (COZAAR) 25 MG tablet Take 1 tablet (25 mg total) by mouth daily. Patient taking differently: Take 25 mg by mouth every evening.  04/01/16 01/14/17 Yes Dorothy Spark, MD  montelukast (SINGULAIR) 10 MG tablet Take 10 mg by mouth at bedtime.     Yes [provider]  Multiple Vitamins-Minerals (MULTIVITAMIN WITH MINERALS) tablet Take 1 tablet by mouth daily.   Yes [provider]  Omega-3 Fatty Acids (FISH OIL BURP-LESS) 1200 MG CAPS Take 1 capsule by mouth at bedtime.    Yes [provider]  ondansetron (ZOFRAN) 4 MG tablet Take 4 mg by mouth every 8 (eight) hours as needed. 12/14/16  Yes [provider]  pravastatin (PRAVACHOL) 20 MG tablet Take 20 mg by mouth at bedtime.  09/17/14  Yes [provider]  ranitidine (ZANTAC) 150 MG tablet Take 1 tablet by mouth at bedtime. 09/24/16  Yes [provider]  sertraline (ZOLOFT) 50 MG tablet Take 50 mg by mouth every evening.  03/15/12  Yes [provider]  vitamin E 400 UNIT capsule Take 400 Units by mouth at bedtime.    Yes [provider]  allopurinol (ZYLOPRIM) 100 MG tablet Take 1 tablet (100 mg total) by mouth daily. Patient not taking:  Reported on 01/14/2017 04/01/16   Veatrice Bourbon, MD    Physical Exam: Vitals:   01/14/17 2315 01/14/17 2330 01/15/17 0000 01/15/17 0015  BP: (!) 100/43 (!) 119/48 123/63 (!) 115/58  Pulse: 92 94 95 94  Resp: 19 12 18 14   Temp:      TempSrc:      SpO2: 95% 97% 96% 95%  Weight:      Height:          Constitutional: Not in acute distress, tachypneic, calm Eyes: PERTLA, lids and conjunctivae normal ENMT: Mucous membranes are moist. Posterior pharynx clear of any exudate or lesions.   Neck: normal, supple, no masses, no thyromegaly Respiratory: Tachypnea and mild dyspnea with speech. Rhonchi bilaterally. Increased WOB. No pallor. No cyanosis.  Cardiovascular: S1 & S2 heard, regular rate and rhythm. 1+ pretibial edema bilaterally. No significant JVD. Abdomen: No distension, no tenderness, no masses palpated. Bowel sounds normal.  Musculoskeletal: no clubbing / cyanosis. No joint deformity upper and lower extremities.   Skin: no significant rashes, lesions, ulcers. Warm, dry, well-perfused. Neurologic: CN 2-12 grossly intact. Sensation intact. Strength 5/5 in all 4 limbs.  Psychiatric: Alert and oriented x 3. Calm, cooperative.    Labs on Admission: I have personally reviewed following labs and imaging studies  CBC: Recent Labs  Lab 01/14/17 2021  WBC 6.3  NEUTROABS 5.3  HGB 10.2*  HCT 31.5*  MCV 95.7  PLT 272*   Basic Metabolic Panel: Recent Labs  Lab 01/14/17 2021  NA 138  K 3.5  CL 104  CO2 22  GLUCOSE 189*  BUN 23*  CREATININE 1.41*  CALCIUM 8.8*   GFR: Estimated Creatinine Clearance: 27.6 mL/min (A) (by C-G formula based on SCr of 1.41 mg/dL (H)). Liver Function Tests: Recent Labs  Lab 01/14/17 2021  AST 24  ALT 16  ALKPHOS 86  BILITOT 0.6  PROT 6.1*  ALBUMIN 4.0   No results for input(s): LIPASE, AMYLASE in the last 168 hours. No results for input(s): AMMONIA in the last 168 hours. Coagulation Profile: No results for input(s): INR, PROTIME in  the last 168 hours. Cardiac Enzymes: No results for input(s): CKTOTAL, CKMB, CKMBINDEX, TROPONINI in the last 168 hours. BNP (last 3  results) No results for input(s): PROBNP in the last 8760 hours. HbA1C: No results for input(s): HGBA1C in the last 72 hours. CBG: No results for input(s): GLUCAP in the last 168 hours. Lipid Profile: No results for input(s): CHOL, HDL, LDLCALC, TRIG, CHOLHDL, LDLDIRECT in the last 72 hours. Thyroid Function Tests: No results for input(s): TSH, T4TOTAL, FREET4, T3FREE, THYROIDAB in the last 72 hours. Anemia Panel: No results for input(s): VITAMINB12, FOLATE, FERRITIN, TIBC, IRON, RETICCTPCT in the last 72 hours. Urine analysis:    Component Value Date/Time   COLORURINE YELLOW 07/20/2013 [place] 07/20/2013 1457   LABSPEC 1.011 07/20/2013 1457   PHURINE 6.0 07/20/2013 1457   GLUCOSEU NEGATIVE 07/20/2013 1457   HGBUR NEGATIVE 07/20/2013 1457   BILIRUBINUR NEGATIVE 07/20/2013 1457   KETONESUR NEGATIVE 07/20/2013 1457   PROTEINUR NEGATIVE 07/20/2013 1457   UROBILINOGEN 0.2 07/20/2013 1457   NITRITE NEGATIVE 07/20/2013 1457   LEUKOCYTESUR NEGATIVE 07/20/2013 1457   Sepsis Labs: @LABRCNTIP (procalcitonin:4,lacticidven:4) )No results found for this or any previous visit (from the past 240 hour(s)).   Radiological Exams on Admission: Dg Chest 2 View  Result Date: 01/14/2017 CLINICAL DATA:  Shortness breath. Productive cough. Hypertension and pneumonia. EXAM: CHEST  2 VIEW COMPARISON:  Two-view chest x-ray 09/12/2016 FINDINGS: Cardiomegaly is again noted. Bilateral lower lobe pneumonia is present. Moderate pulmonary vascular congestion is evident. The upper lung fields are clear. The visualized soft tissues and bony thorax are unremarkable. Exaggerated thoracolumbar kyphosis is again seen. A remote T12 fracture is noted. IMPRESSION: 1. Bilateral lower lobar pneumonia. 2. Cardiomegaly with increased pulmonary vascular congestion.  Electronically Signed   By: San Morelle M.D.   On: 01/14/2017 20:10    EKG: Independently reviewed. Sinus rhythm, LAFB.   Assessment/Plan  1. Pneumonia  - Pt presents with dyspnea and productive cough, noted to have poor saturations on room air  - No fever or leukocytosis on admission, but rhonchi noted and CXR with bibasilar consolidations consistent with PNA  - Blood cultures collected in ED, will add sputum cultures and check strep pneumo antigen, start Rocephin and azithromycin  - Continue adjunctive glucocorticoid, continue supportive care with prn supplemental O2 and albuterol    2. CKD stage III  - SCr is 1.41 on admission, consistent with her apparent baseline  - Renally-dose medications and avoid nephrotoxins    3. Hypertension  - BP at goal  - Continue losartan    4. Anemia; thrombocytopenia  - Hgb is stable at 10.2 on admission and platelets a little worse at 108,000  - No bleeding evident, attributed to iron-deficiency and treated with iron-supplementation   5. Depression  - Stable, continue Zoloft    6. Symptomatic bradycardia  - She is s/p pacer placement, normal HR in ED    DVT prophylaxis: sq heparin  Code Status: Full  Family Communication: Discussed with patient Disposition Plan: Admit to med-surg Consults called: None Admission status: Inpatient    Vianne Bulls, MD [facility] Pager [phone]  If 7PM-7AM, please contact night-coverage [email or web address] Password [email or web address]  01/15/2017, 12:30 AM

## 2017-01-15 NOTE — Progress Notes (Signed)
SATURATION QUALIFICATIONS: (This note is used to comply with regulatory documentation for home oxygen) ? ?Patient Saturations on Room Air at Rest = 93% ? ?Patient Saturations on Room Air while Ambulating = 91% ? ? ?

## 2017-01-15 NOTE — ED Notes (Signed)
Report given to rn on 6n      

## 2017-01-16 ENCOUNTER — Inpatient Hospital Stay (HOSPITAL_COMMUNITY): Payer: Medicare Other

## 2017-01-16 LAB — CBC WITH DIFFERENTIAL/PLATELET
BASOS PCT: 0 %
Basophils Absolute: 0 10*3/uL (ref 0.0–0.1)
EOS ABS: 0 10*3/uL (ref 0.0–0.7)
Eosinophils Relative: 0 %
HCT: 29.8 % — ABNORMAL LOW (ref 36.0–46.0)
HEMOGLOBIN: 9.7 g/dL — AB (ref 12.0–15.0)
LYMPHS ABS: 0.9 10*3/uL (ref 0.7–4.0)
Lymphocytes Relative: 9 %
MCH: 31.3 pg (ref 26.0–34.0)
MCHC: 32.6 g/dL (ref 30.0–36.0)
MCV: 96.1 fL (ref 78.0–100.0)
Monocytes Absolute: 0.4 10*3/uL (ref 0.1–1.0)
Monocytes Relative: 4 %
NEUTROS ABS: 9 10*3/uL — AB (ref 1.7–7.7)
NEUTROS PCT: 87 %
Platelets: 149 10*3/uL — ABNORMAL LOW (ref 150–400)
RBC: 3.1 MIL/uL — AB (ref 3.87–5.11)
RDW: 13.3 % (ref 11.5–15.5)
WBC: 10.3 10*3/uL (ref 4.0–10.5)

## 2017-01-16 LAB — COMPREHENSIVE METABOLIC PANEL
ALBUMIN: 3.4 g/dL — AB (ref 3.5–5.0)
ALK PHOS: 75 U/L (ref 38–126)
ALT: 16 U/L (ref 14–54)
AST: 24 U/L (ref 15–41)
Anion gap: 9 (ref 5–15)
BUN: 34 mg/dL — ABNORMAL HIGH (ref 6–20)
CALCIUM: 8.8 mg/dL — AB (ref 8.9–10.3)
CO2: 24 mmol/L (ref 22–32)
CREATININE: 1.37 mg/dL — AB (ref 0.44–1.00)
Chloride: 103 mmol/L (ref 101–111)
GFR calc Af Amer: 39 mL/min — ABNORMAL LOW (ref >60)
GFR calc non Af Amer: 34 mL/min — ABNORMAL LOW (ref >60)
GLUCOSE: 154 mg/dL — AB (ref 65–99)
Potassium: 4.6 mmol/L (ref 3.5–5.1)
SODIUM: 136 mmol/L (ref 135–145)
Total Bilirubin: 0.2 mg/dL — ABNORMAL LOW (ref 0.3–1.2)
Total Protein: 5.8 g/dL — ABNORMAL LOW (ref 6.5–8.1)

## 2017-01-16 LAB — BRAIN NATRIURETIC PEPTIDE: B Natriuretic Peptide: 334.6 pg/mL — ABNORMAL HIGH (ref 0.0–100.0)

## 2017-01-16 MED ORDER — FUROSEMIDE 10 MG/ML IJ SOLN
40.0000 mg | Freq: Once | INTRAMUSCULAR | Status: AC
  Administered 2017-01-16: 40 mg via INTRAVENOUS
  Filled 2017-01-16: qty 4

## 2017-01-16 MED ORDER — AZITHROMYCIN 250 MG PO TABS
500.0000 mg | ORAL_TABLET | Freq: Every day | ORAL | Status: DC
  Administered 2017-01-16: 500 mg via ORAL
  Filled 2017-01-16: qty 2

## 2017-01-16 NOTE — Progress Notes (Signed)
PROGRESS NOTE    [name]  SWN:[id number] DOB: [birth year] DOA: 01/14/2017 PCP: [name], MD   Specialists:     Brief Narrative:   12 female symp bradycardia s/p ppm 06/30/16 Prior atrial ectopy on flecainide Low risk stess ef 69% 09/2013 Mild pulm htn Fibromyalgia gerd ckd iii htn Prior anemia  Admitted with cough fever and likely PNA--CXR showed bilateral Lower lobe OPNA Has been sick ~ 1 mo--has had cough and rx with 1 round abx Went to her doc last Tuesday--thoguth to have sinus issues     Assessment & Plan:   Principal Problem:   CAP (community acquired pneumonia) Active Problems:   Anemia, unspecified   Depression   CKD (chronic kidney disease), stage III ([place])   Essential hypertension   Symptomatic bradycardia   Thrombocytopenia ([place])   CAP-Cont azithro/ceftriax.  Change to po levaquin in am.  Cbc in am.  possible diastolic AECHF-given lasix 40 x 2 occasions.  Would monitor symptoms-BNP slight elevated 300 raising possibility of both issues concurrently--will taper solu-medrol to off today-cont mucinex 1 tab, albuterol nebs 2.5  Q4prn, singulair 10 hs AKI-on admit bun/creat 1.6-->now 1.3.  OP re-eval 1 week sympt bradycardia s/p ppm 06/30/16--OP re-eval Mild pulm htn-stable-cont losartan 25 hld-cont pravachol 20 pm Depression-cont sertraline 50, ativan 0.5-1 daily GERD-cont pepcid 20 mg Mild TCP-age related sensecence vs other-OP work-up     DVT prophylaxis: scd Code Status: full Family Communication: inpatient Disposition Plan: home 24-48 h   Consultants:    none  Procedures:   none  Antimicrobials:   Ceftriaxone, Azithromycin    Subjective:  Well no new issue Eating drinking sitting comfy in chair without issue nor concern No cp fever chills n/v  Objective: Vitals:   01/15/17 0546 01/15/17 1441 01/15/17 2054 01/16/17 0536  BP: (!) 115/51 (!) 127/51 (!) 119/48 112/72  Pulse: 81 62 60 61  Resp: 16 16 18 16    Temp: 98.2 F (36.8 C) 99 F (37.2 C) 98.8 F (37.1 C) 98.1 F (36.7 C)  TempSrc: Oral Oral Oral Oral  SpO2: 94% 94% 94% 91%  Weight:      Height:        Intake/Output Summary (Last 24 hours) at 01/16/2017 1403 Last data filed at 01/15/2017 2340 Gross per 24 hour  Intake -  Output 1025 ml  Net -1025 ml   Filed Weights   01/14/17 1908 01/15/17 0152  Weight: 77.1 kg (170 lb) 79.8 kg (176 lb)    Examination:  eomi strong apperaing older female no distres snot on oxygen today  Chest clear no added sodun abd soft nnt dn no rebound no guard No le edema  Data Reviewed: I have personally reviewed following labs and imaging studies  CBC: Recent Labs  Lab 01/14/17 2021 01/15/17 0120 01/16/17 0522  WBC 6.3 6.0 10.3  NEUTROABS 5.3 5.7 9.0*  HGB 10.2* 9.8* 9.7*  HCT 31.5* 30.7* 29.8*  MCV 95.7 96.8 96.1  PLT 108* 118* 938*   Basic Metabolic Panel: Recent Labs  Lab 01/14/17 2021 01/15/17 0120 01/16/17 0522  NA 138 137 136  K 3.5 4.0 4.6  CL 104 104 103  CO2 22 19* 24  GLUCOSE 189* 299* 154*  BUN 23* 27* 34*  CREATININE 1.41* 1.60* 1.37*  CALCIUM 8.8* 8.6* 8.8*   GFR: Estimated Creatinine Clearance: 29 mL/min (A) (by C-G formula based on SCr of 1.37 mg/dL (H)). Liver Function Tests: Recent Labs  Lab 01/14/17 2021 01/16/17 0522  AST 24  24  ALT 16 16  ALKPHOS 86 75  BILITOT 0.6 0.2*  PROT 6.1* 5.8*  ALBUMIN 4.0 3.4*   No results for input(s): LIPASE, AMYLASE in the last 168 hours. No results for input(s): AMMONIA in the last 168 hours. Coagulation Profile: No results for input(s): INR, PROTIME in the last 168 hours. Cardiac Enzymes: No results for input(s): CKTOTAL, CKMB, CKMBINDEX, TROPONINI in the last 168 hours. BNP (last 3 results) No results for input(s): PROBNP in the last 8760 hours. HbA1C: No results for input(s): HGBA1C in the last 72 hours. CBG: No results for input(s): GLUCAP in the last 168 hours. Lipid Profile: No results for input(s):  CHOL, HDL, LDLCALC, TRIG, CHOLHDL, LDLDIRECT in the last 72 hours. Thyroid Function Tests: No results for input(s): TSH, T4TOTAL, FREET4, T3FREE, THYROIDAB in the last 72 hours. Anemia Panel: No results for input(s): VITAMINB12, FOLATE, FERRITIN, TIBC, IRON, RETICCTPCT in the last 72 hours. Urine analysis:    Component Value Date/Time   COLORURINE YELLOW 07/20/2013 [place] 07/20/2013 1457   LABSPEC 1.011 07/20/2013 1457   PHURINE 6.0 07/20/2013 1457   GLUCOSEU NEGATIVE 07/20/2013 1457   HGBUR NEGATIVE 07/20/2013 1457   BILIRUBINUR NEGATIVE 07/20/2013 1457   KETONESUR NEGATIVE 07/20/2013 1457   PROTEINUR NEGATIVE 07/20/2013 1457   UROBILINOGEN 0.2 07/20/2013 1457   NITRITE NEGATIVE 07/20/2013 [place] 07/20/2013 1457     Radiology Studies: Reviewed images personally in health database    Scheduled Meds: . aspirin EC  81 mg Oral QHS  . azithromycin  500 mg Oral QHS  . cholecalciferol  1,000 Units Oral QHS  . colchicine  0.6 mg Oral QODAY  . dextromethorphan-guaiFENesin  1 tablet Oral BID  . famotidine  20 mg Oral QHS  . heparin  5,000 Units Subcutaneous Q8H  . losartan  25 mg Oral QPM  . methylPREDNISolone (SOLU-MEDROL) injection  125 mg Intravenous Once  . methylPREDNISolone (SOLU-MEDROL) injection  40 mg Intravenous Q12H  . montelukast  10 mg Oral QHS  . multivitamin with minerals  1 tablet Oral Daily  . omega-3 acid ethyl esters  1 g Oral QHS  . pravastatin  20 mg Oral q1800  . sertraline  50 mg Oral QPM   Continuous Infusions: . cefTRIAXone (ROCEPHIN)  IV Stopped (01/15/17 2127)     LOS: 1 day    Time spent: [place], MD [facility]   If 7PM-7AM, please contact night-coverage [email or web address] Password [email or web address] 01/16/2017, 2:03 PM

## 2017-01-17 LAB — CUP PACEART REMOTE DEVICE CHECK
Battery Remaining Longevity: 114 mo
Date Time Interrogation Session: 20181127141400
Implantable Lead Implant Date: 20180524
Implantable Lead Location: 753860
Implantable Lead Model: 7740
Implantable Pulse Generator Implant Date: 20180524
Lead Channel Pacing Threshold Amplitude: 0.8 V
Lead Channel Pacing Threshold Pulse Width: 0.4 ms
Lead Channel Setting Pacing Amplitude: 2 V
Lead Channel Setting Sensing Sensitivity: 2 mV
MDC IDC LEAD IMPLANT DT: 20180524
MDC IDC LEAD LOCATION: 753859
MDC IDC LEAD SERIAL: 693708
MDC IDC LEAD SERIAL: 861246
MDC IDC MSMT BATTERY REMAINING PERCENTAGE: 100 %
MDC IDC MSMT LEADCHNL RA IMPEDANCE VALUE: 705 Ohm
MDC IDC MSMT LEADCHNL RA PACING THRESHOLD AMPLITUDE: 0.5 V
MDC IDC MSMT LEADCHNL RA PACING THRESHOLD PULSEWIDTH: 0.4 ms
MDC IDC MSMT LEADCHNL RV IMPEDANCE VALUE: 590 Ohm
MDC IDC PG SERIAL: 308044
MDC IDC SET LEADCHNL RV PACING AMPLITUDE: 2.4 V
MDC IDC SET LEADCHNL RV PACING PULSEWIDTH: 0.4 ms
MDC IDC STAT BRADY RA PERCENT PACED: 69 %
MDC IDC STAT BRADY RV PERCENT PACED: 0 %

## 2017-01-17 MED ORDER — HYDROCOD POLST-CPM POLST ER 10-8 MG/5ML PO SUER
5.0000 mL | Freq: Three times a day (TID) | ORAL | Status: DC
  Administered 2017-01-17 – 2017-01-18 (×3): 5 mL via ORAL
  Filled 2017-01-17 (×3): qty 5

## 2017-01-17 MED ORDER — FUROSEMIDE 20 MG PO TABS
20.0000 mg | ORAL_TABLET | Freq: Every day | ORAL | Status: DC
  Administered 2017-01-17 – 2017-01-18 (×2): 20 mg via ORAL
  Filled 2017-01-17 (×2): qty 1

## 2017-01-17 MED ORDER — WHITE PETROLATUM EX OINT
TOPICAL_OINTMENT | CUTANEOUS | Status: AC
  Administered 2017-01-17: 18:00:00
  Filled 2017-01-17: qty 28.35

## 2017-01-17 MED ORDER — LEVOFLOXACIN 500 MG PO TABS
500.0000 mg | ORAL_TABLET | Freq: Every day | ORAL | Status: DC
  Administered 2017-01-17 – 2017-01-18 (×2): 500 mg via ORAL
  Filled 2017-01-17 (×2): qty 1

## 2017-01-17 MED ORDER — BLISTEX MEDICATED EX OINT
TOPICAL_OINTMENT | CUTANEOUS | Status: DC | PRN
  Filled 2017-01-17: qty 7

## 2017-01-17 NOTE — Progress Notes (Signed)
PROGRESS NOTE    [name]  CHY:[id number] DOB: [birth year] DOA: 01/14/2017 PCP: [name], MD   Specialists:     Brief Narrative:   8 female symp bradycardia s/p ppm 06/30/16 Prior atrial ectopy on flecainide Low risk stess ef 69% 09/2013 Mild pulm htn Fibromyalgia gerd ckd iii htn Prior anemia  Admitted with cough fever and likely PNA--CXR showed bilateral Lower lobe OPNA Has been sick ~ 1 mo--has had cough and rx with 1 round abx Went to her doc last Tuesday--thoguth to have sinus issues     Assessment & Plan:   Principal Problem:   CAP (community acquired pneumonia) Active Problems:   Anemia, unspecified   Depression   CKD (chronic kidney disease), stage III ([place])   Essential hypertension   Symptomatic bradycardia   Thrombocytopenia ([facility])   CAP-azithro/ceftriax change to po levaquin 12/10-need cxr 4 wk  possible diastolic AECHF-given lasix 40 x 2 occasions.  Would monitor symptoms-BNP slight elevated 300 raising possibility of both issues concurrently--will taper solu-medrol to off today-cont mucinex 1 tab, albuterol nebs 2.5  Q4prn, singulair 10 hs--giving lasix 20 po daily and will see how breathing changes with addition of daily dosing AKI-on admit bun/creat 1.6-->now 1.3.  OP re-eval 1 week sympt bradycardia s/p ppm 06/30/16--OP re-eval Mild pulm htn-stable-cont losartan 25 hld-cont pravachol 20 pm Depression-cont sertraline 50, ativan 0.5-1 daily GERD-cont pepcid 20 mg Mild TCP-age related sensecence vs other-OP work-up     DVT prophylaxis: scd Code Status: full Family Communication: inpatient Disposition Plan: home 24-48 h   Consultants:    none  Procedures:   none  Antimicrobials:   Ceftriaxone, Azithromycin    Subjective:  Looks a little frailer Some cough today not as well as prior warm to touch No   Objective: Vitals:   01/16/17 0536 01/16/17 1500 01/16/17 2115 01/17/17 1409  BP: 112/72 (!) 119/54  137/61 (!) 113/56  Pulse: 61 (!) 58 60 67  Resp: 16 16 18 18   Temp: 98.1 F (36.7 C) 99 F (37.2 C) 98.2 F (36.8 C) 98.2 F (36.8 C)  TempSrc: Oral Oral Oral Oral  SpO2: 91% 97% 96% 94%  Weight:      Height:        Intake/Output Summary (Last 24 hours) at 01/17/2017 1425 Last data filed at 01/17/2017 1100 Gross per 24 hour  Intake 650 ml  Output -  Net 650 ml   Filed Weights   01/14/17 1908 01/15/17 0152  Weight: 77.1 kg (170 lb) 79.8 kg (176 lb)    Examination:  eomi strong appearing older female no distress Slight warm to touch not on oxygen  Chest clear no added sodun abd soft nnt dn no rebound no guard No le edema  Data Reviewed: I have personally reviewed following labs and imaging studies  CBC: Recent Labs  Lab 01/14/17 2021 01/15/17 0120 01/16/17 0522  WBC 6.3 6.0 10.3  NEUTROABS 5.3 5.7 9.0*  HGB 10.2* 9.8* 9.7*  HCT 31.5* 30.7* 29.8*  MCV 95.7 96.8 96.1  PLT 108* 118* 878*   Basic Metabolic Panel: Recent Labs  Lab 01/14/17 2021 01/15/17 0120 01/16/17 0522  NA 138 137 136  K 3.5 4.0 4.6  CL 104 104 103  CO2 22 19* 24  GLUCOSE 189* 299* 154*  BUN 23* 27* 34*  CREATININE 1.41* 1.60* 1.37*  CALCIUM 8.8* 8.6* 8.8*   GFR: Estimated Creatinine Clearance: 29 mL/min (A) (by C-G formula based on SCr of 1.37 mg/dL (H)). Liver Function  Tests: Recent Labs  Lab 01/14/17 2021 01/16/17 0522  AST 24 24  ALT 16 16  ALKPHOS 86 75  BILITOT 0.6 0.2*  PROT 6.1* 5.8*  ALBUMIN 4.0 3.4*   No results for input(s): LIPASE, AMYLASE in the last 168 hours. No results for input(s): AMMONIA in the last 168 hours. Coagulation Profile: No results for input(s): INR, PROTIME in the last 168 hours. Cardiac Enzymes: No results for input(s): CKTOTAL, CKMB, CKMBINDEX, TROPONINI in the last 168 hours. BNP (last 3 results) No results for input(s): PROBNP in the last 8760 hours. HbA1C: No results for input(s): HGBA1C in the last 72 hours. CBG: No results for  input(s): GLUCAP in the last 168 hours. Lipid Profile: No results for input(s): CHOL, HDL, LDLCALC, TRIG, CHOLHDL, LDLDIRECT in the last 72 hours. Thyroid Function Tests: No results for input(s): TSH, T4TOTAL, FREET4, T3FREE, THYROIDAB in the last 72 hours. Anemia Panel: No results for input(s): VITAMINB12, FOLATE, FERRITIN, TIBC, IRON, RETICCTPCT in the last 72 hours. Urine analysis:    Component Value Date/Time   COLORURINE YELLOW 07/20/2013 [place] 07/20/2013 1457   LABSPEC 1.011 07/20/2013 1457   PHURINE 6.0 07/20/2013 1457   GLUCOSEU NEGATIVE 07/20/2013 1457   HGBUR NEGATIVE 07/20/2013 1457   BILIRUBINUR NEGATIVE 07/20/2013 1457   KETONESUR NEGATIVE 07/20/2013 1457   PROTEINUR NEGATIVE 07/20/2013 1457   UROBILINOGEN 0.2 07/20/2013 1457   NITRITE NEGATIVE 07/20/2013 [place] 07/20/2013 1457     Radiology Studies: Reviewed images personally in health database    Scheduled Meds: . aspirin EC  81 mg Oral QHS  . cholecalciferol  1,000 Units Oral QHS  . colchicine  0.6 mg Oral QODAY  . dextromethorphan-guaiFENesin  1 tablet Oral BID  . famotidine  20 mg Oral QHS  . furosemide  20 mg Oral Daily  . heparin  5,000 Units Subcutaneous Q8H  . levofloxacin  500 mg Oral Daily  . losartan  25 mg Oral QPM  . methylPREDNISolone (SOLU-MEDROL) injection  125 mg Intravenous Once  . montelukast  10 mg Oral QHS  . multivitamin with minerals  1 tablet Oral Daily  . omega-3 acid ethyl esters  1 g Oral QHS  . pravastatin  20 mg Oral q1800  . sertraline  50 mg Oral QPM   Continuous Infusions:    LOS: 2 days    Time spent: [place], MD [facility] ([facility]   If 7PM-7AM, please contact night-coverage [email or web address] Password [email or web address] 01/17/2017, 2:25 PM

## 2017-01-18 MED ORDER — LEVOFLOXACIN 500 MG PO TABS: 500.0000 mg | ORAL_TABLET | Freq: Every day | ORAL | 0 refills | Status: AC

## 2017-01-18 MED ORDER — HYDROCOD POLST-CPM POLST ER 10-8 MG/5ML PO SUER: 5.0000 mL | Freq: Three times a day (TID) | ORAL | 0 refills | Status: DC

## 2017-01-18 MED ORDER — FUROSEMIDE 20 MG PO TABS: 20.0000 mg | ORAL_TABLET | ORAL | 0 refills | Status: DC

## 2017-01-18 NOTE — Progress Notes (Signed)
[name] to be D/C'd  per MD order. Discussed with the patient and all questions fully answered.  VSS, Skin clean, dry and intact without evidence of skin break down, no evidence of skin tears noted.  IV catheter discontinued intact. Site without signs and symptoms of complications. Dressing and pressure applied.  An After Visit Summary was printed and given to the patient. Patient received prescription.  D/c education completed with patient/family including follow up instructions, medication list, d/c activities limitations if indicated, with other d/c instructions as indicated by MD - patient able to verbalize understanding, all questions fully answered.   Patient instructed to return to ED, call 911, or call MD for any changes in condition.   Patient to be escorted via [place], and D/C home via private auto.

## 2017-01-18 NOTE — Discharge Summary (Signed)
Physician Discharge Summary  [name] DVV:[id number] DOB: [birth year] DOA: 01/14/2017  PCP: [name], MD  Admit date: 01/14/2017 Discharge date: 01/18/2017  Time spent: 25 minutes  Recommendations for Outpatient Follow-up:  Needs ? Echo as OP--had slght ? bnp-adding lasic to OP meds Will also need CXR 1 mo-levaquin stop date 12/13  Discharge Diagnoses:  Principal Problem:   CAP (community acquired pneumonia) Active Problems:   Anemia, unspecified   Depression   CKD (chronic kidney disease), stage III ([place])   Essential hypertension   Symptomatic bradycardia   Thrombocytopenia ([place])   Discharge Condition: improved  Diet recommendation:  hh low salt  Filed Weights   01/14/17 1908 01/15/17 0152  Weight: 77.1 kg (170 lb) 79.8 kg (176 lb)    History of present illness: 5 female symp bradycardia s/p ppm 06/30/16 Prior atrial ectopy on flecainide Low risk stess ef 69% 09/2013 Mild pulm htn Fibromyalgia gerd ckd iii htn Prior anemia   Admitted with cough fever and likely PNA--CXR showed bilateral Lower lobe OPNA Has been sick ~ 1 mo--has had cough and rx with 1 round abx Went to her doc last Tuesday--thoguth to have sinus issues   Hospital Course:  CAP-azithro/ceftriax change to po levaquin 12/10-need cxr 4 wk  possible diastolic AECHF-given lasix 40 x 2 occasions.  Would monitor symptoms-BNP slight elevated 300 raising possibility of both issues concurrently--will taper solu-medrol to off today-cont mucinex 1 tab, albuterol nebs 2.5  Q4prn, singulair 10 hs--giving lasix 20 po qod as slight low bp on d/c--not septic.  Get TTE as OP AKI-on admit bun/creat 1.6-->now 1.3.  OP re-eval 1 week sympt bradycardia s/p ppm 06/30/16--OP re-eval Mild pulm htn-stable-cont losartan 25 hld-cont pravachol 20 pm Depression-cont sertraline 50, ativan 0.5-1 daily GERD-cont pepcid 20 mg Mild TCP-age related sensecence vs other-OP work-up     Discharge Exam: Vitals:    01/17/17 2155 01/18/17 0446  BP: (!) 98/44 (!) 98/50  Pulse: 80 61  Resp: 18 16  Temp: 98.8 F (37.1 C) (!) 97.4 F (36.3 C)  SpO2: 97% 94%    General: alert fair in nad no distress Cardiovascular:  s1 s2 no m/r/g Respiratory: clear no added sound  Discharge Instructions   Discharge Instructions    Diet - low sodium heart healthy   Complete by:  As directed    Discharge instructions   Complete by:  As directed    Complete oral antibiotics for PNA Discuss with your doctor taking lasix every other day and addition of this to your meds.  You probably need to have some heart tests done and I will infomr your doctor of what needs to be done--nothting to worry about, just some routine tests   Increase activity slowly   Complete by:  As directed        Allergies as of 01/18/2017      Reactions   Penicillins Rash   Amoxicillin Other (See Comments)   Very weak Has patient had a PCN reaction causing immediate rash, facial/tongue/throat swelling, SOB or lightheadedness with hypotension: no Has patient had a PCN reaction causing severe rash involving mucus membranes or skin necrosis: no Has patient had a PCN reaction that required hospitalization: no Has patient had a PCN reaction occurring within the last 10 years: no If all of the above answers are "NO", then may proceed with Cephalosporin use.   Diltiazem Hcl Other (See Comments)   Reaction unknown   Sulfonamide Derivatives Other (See Comments)   Reaction unknown  Zetia [ezetimibe] Other (See Comments)   Weakness      Medication List    STOP taking these medications   ibuprofen 800 MG tablet Commonly known as:  ADVIL,MOTRIN     TAKE these medications   acetaminophen 500 MG tablet Commonly known as:  TYLENOL Take 1,000 mg by mouth every 6 (six) hours as needed for mild pain, moderate pain or headache.   albuterol 108 (90 Base) MCG/ACT inhaler Commonly known as:  PROVENTIL HFA;VENTOLIN HFA Inhale 1 puff into the  lungs every 6 (six) hours as needed for wheezing or shortness of breath.   alendronate 70 MG tablet Commonly known as:  FOSAMAX Take 70 mg by mouth every Friday.   ALKA-SELTZER PLS SINUS & COUGH 10-5-325 MG Caps Generic drug:  DM-Phenylephrine-Acetaminophen Take 1 capsule by mouth daily as needed.   allopurinol 100 MG tablet Commonly known as:  ZYLOPRIM Take 1 tablet (100 mg total) by mouth daily.   aspirin EC 81 MG tablet Take 81 mg by mouth at bedtime.   cholecalciferol 1000 units tablet Commonly known as:  VITAMIN D Take 1,000 Units by mouth at bedtime.   colchicine 0.6 MG tablet Take 1 tablet by mouth every other day.   DRY EYES OP Place 1-2 drops into both eyes as needed (for dry eyes).   Ferrous Sulfate 28 MG Tabs Take 28 mg by mouth at bedtime.   FISH OIL BURP-LESS 1200 MG Caps Take 1 capsule by mouth at bedtime.   furosemide 20 MG tablet Commonly known as:  LASIX Take 1 tablet (20 mg total) by mouth every other day.   GLUCOSAMINE PO Take 1 tablet by mouth daily.   levofloxacin 500 MG tablet Commonly known as:  LEVAQUIN Take 1 tablet (500 mg total) by mouth daily for 10 days.   losartan 25 MG tablet Commonly known as:  COZAAR Take 1 tablet (25 mg total) by mouth daily. What changed:  when to take this   montelukast 10 MG tablet Commonly known as:  SINGULAIR Take 10 mg by mouth at bedtime.   multivitamin with minerals tablet Take 1 tablet by mouth daily.   ondansetron 4 MG tablet Commonly known as:  ZOFRAN Take 4 mg by mouth every 8 (eight) hours as needed.   pravastatin 20 MG tablet Commonly known as:  PRAVACHOL Take 20 mg by mouth at bedtime.   ranitidine 150 MG tablet Commonly known as:  ZANTAC Take 1 tablet by mouth at bedtime.   sertraline 50 MG tablet Commonly known as:  ZOLOFT Take 50 mg by mouth every evening.   vitamin E 400 UNIT capsule Take 400 Units by mouth at bedtime.          Allergies  Allergen Reactions  .  Penicillins Rash  . Amoxicillin Other (See Comments)    Very weak Has patient had a PCN reaction causing immediate rash, facial/tongue/throat swelling, SOB or lightheadedness with hypotension: no Has patient had a PCN reaction causing severe rash involving mucus membranes or skin necrosis: no Has patient had a PCN reaction that required hospitalization: no Has patient had a PCN reaction occurring within the last 10 years: no If all of the above answers are "NO", then may proceed with Cephalosporin use.   . Diltiazem Hcl Other (See Comments)    Reaction unknown  . Sulfonamide Derivatives Other (See Comments)    Reaction unknown  . Zetia [Ezetimibe] Other (See Comments)    Weakness      The results of  significant diagnostics from this hospitalization (including imaging, microbiology, ancillary and laboratory) are listed below for reference.    Significant Diagnostic Studies: Dg Chest 2 View  Result Date: 01/16/2017 CLINICAL DATA:  83 year old female with pneumonia. EXAM: CHEST  2 VIEW COMPARISON:  01/14/2017 FINDINGS: Left-sided 2 lead ICD device appears in stable position. Cardiomediastinal silhouette is unchanged in size and configuration. Improved appearance of bibasilar pneumonia with mild residual changes noted. New linear left basilar atelectasis. No pleural effusions or pneumothorax. No acute osseous abnormality. IMPRESSION: Improving appearance of bibasilar opacities with new linear retrocardiac atelectasis. Electronically Signed   By: Kristopher Oppenheim M.D.   On: 01/16/2017 07:48   Dg Chest 2 View  Result Date: 01/14/2017 CLINICAL DATA:  Shortness breath. Productive cough. Hypertension and pneumonia. EXAM: CHEST  2 VIEW COMPARISON:  Two-view chest x-ray 09/12/2016 FINDINGS: Cardiomegaly is again noted. Bilateral lower lobe pneumonia is present. Moderate pulmonary vascular congestion is evident. The upper lung fields are clear. The visualized soft tissues and bony thorax are  unremarkable. Exaggerated thoracolumbar kyphosis is again seen. A remote T12 fracture is noted. IMPRESSION: 1. Bilateral lower lobar pneumonia. 2. Cardiomegaly with increased pulmonary vascular congestion. Electronically Signed   By: San Morelle M.D.   On: 01/14/2017 20:10    Microbiology: Recent Results (from the past 240 hour(s))  Blood culture (routine x 2)     Status: None (Preliminary result)   Collection Time: 01/14/17 10:42 PM  Result Value Ref Range Status   Specimen Description BLOOD RIGHT ANTECUBITAL  Final   Special Requests   Final    BOTTLES DRAWN AEROBIC AND ANAEROBIC Blood Culture results may not be optimal due to an excessive volume of blood received in culture bottles   Culture NO GROWTH 4 DAYS  Final   Report Status PENDING  Incomplete  Blood culture (routine x 2)     Status: None (Preliminary result)   Collection Time: 01/14/17 11:00 PM  Result Value Ref Range Status   Specimen Description BLOOD HAND RIGHT  Final   Special Requests   Final    BOTTLES DRAWN AEROBIC AND ANAEROBIC Blood Culture adequate volume   Culture NO GROWTH 4 DAYS  Final   Report Status PENDING  Incomplete     Labs: Basic Metabolic Panel: Recent Labs  Lab 01/14/17 2021 01/15/17 0120 01/16/17 0522  NA 138 137 136  K 3.5 4.0 4.6  CL 104 104 103  CO2 22 19* 24  GLUCOSE 189* 299* 154*  BUN 23* 27* 34*  CREATININE 1.41* 1.60* 1.37*  CALCIUM 8.8* 8.6* 8.8*   Liver Function Tests: Recent Labs  Lab 01/14/17 2021 01/16/17 0522  AST 24 24  ALT 16 16  ALKPHOS 86 75  BILITOT 0.6 0.2*  PROT 6.1* 5.8*  ALBUMIN 4.0 3.4*   No results for input(s): LIPASE, AMYLASE in the last 168 hours. No results for input(s): AMMONIA in the last 168 hours. CBC: Recent Labs  Lab 01/14/17 2021 01/15/17 0120 01/16/17 0522  WBC 6.3 6.0 10.3  NEUTROABS 5.3 5.7 9.0*  HGB 10.2* 9.8* 9.7*  HCT 31.5* 30.7* 29.8*  MCV 95.7 96.8 96.1  PLT 108* 118* 149*   Cardiac Enzymes: No results for  input(s): CKTOTAL, CKMB, CKMBINDEX, TROPONINI in the last 168 hours. BNP: BNP (last 3 results) Recent Labs    01/16/17 0522  BNP 334.6*    ProBNP (last 3 results) No results for input(s): PROBNP in the last 8760 hours.  CBG: No results for input(s): GLUCAP in the  last 168 hours.     Signed:  Nita Sells MD   [facility] 01/18/2017, 12:53 PM

## 2017-01-18 NOTE — Consult Note (Signed)
            [facility] CM Primary Care Navigator  01/18/2017  [name] 08/12/1927 [id number]   Per chart review, patient was having cough and fever with chest x-ray result that showed bilateral lobe pneumonia that resulted to this admission and she was treated for it.  Patient was discharged home today.  Primary care provider's office is listed as providing transition of care (TOC).   For additional questions please contact:  Edwena Felty A. Jamiaya Bina, BSN, RN-BC [facility] PRIMARY CARE Navigator Cell: [phone]

## 2017-01-19 LAB — CULTURE, BLOOD (ROUTINE X 2)
CULTURE: NO GROWTH
Culture: NO GROWTH
SPECIAL REQUESTS: ADEQUATE

## 2017-01-19 NOTE — Progress Notes (Signed)
Pharmacy communication  I received a message from patients [facility] regarding clarification of Tussionex dose. She had been discharged by Dr. Verlon Au on 01/18/17 with Tussionex 5 ML TID but the Pharmacist advised that they had the extended release preparation and the dosing would be bid. I called and discussed with Dr. Verlon Au who wanted the prescription to change to 5 mL bid for 7 days without refill. He indicated that although this medication showed up on patient's AVS, it was not on her DC summary because he added this medication after completing the DC summary which was not updated. Also as per his note, levofloxacin to stop 01/20/2017. Communicated this with the pharmacist.  Vernell Leep, MD, FACP, [facility]. [facility] Pager [phone]  If 7PM-7AM, please contact night-coverage [email or web address] Password [email or web address] 01/20/2017, 5:30 PM

## 2017-01-20 DIAGNOSIS — J189 Pneumonia, unspecified organism: Secondary | ICD-10-CM | POA: Diagnosis not present

## 2017-01-20 NOTE — Progress Notes (Signed)
Spoke with patient's daughter 2x today and grandaughter once. Family voices concern about the discharge prescription for p.o. Levaquin and whether additional doses need to be given. Earlier today, I spoke with the [facility] office. And per earlier progress note, modifications were made as needed.  Patient's daughter [name], continues to request additional does of Levaquin  to be released.  Will follow-up with [place] office.

## 2017-01-25 DIAGNOSIS — J189 Pneumonia, unspecified organism: Secondary | ICD-10-CM | POA: Diagnosis not present

## 2017-02-11 ENCOUNTER — Ambulatory Visit: Payer: Medicare Other | Admitting: Cardiology

## 2017-02-23 ENCOUNTER — Other Ambulatory Visit: Payer: Self-pay | Admitting: Internal Medicine

## 2017-02-23 ENCOUNTER — Ambulatory Visit
Admission: RE | Admit: 2017-02-23 | Discharge: 2017-02-23 | Disposition: A | Discharge status: Home / Self Care | Payer: Medicare Other | Source: Ambulatory Visit | Attending: Internal Medicine | Admitting: Internal Medicine

## 2017-02-23 DIAGNOSIS — J189 Pneumonia, unspecified organism: Secondary | ICD-10-CM | POA: Diagnosis not present

## 2017-02-23 DIAGNOSIS — R05 Cough: Secondary | ICD-10-CM | POA: Diagnosis not present

## 2017-02-23 DIAGNOSIS — J411 Mucopurulent chronic bronchitis: Secondary | ICD-10-CM | POA: Diagnosis not present

## 2017-02-28 ENCOUNTER — Ambulatory Visit: Payer: Medicare Other | Admitting: Cardiology

## 2017-02-28 ENCOUNTER — Encounter: Payer: Self-pay | Admitting: Cardiology

## 2017-02-28 VITALS — BP 136/70 | HR 60 | Ht 63.0 in | Wt 165.0 lb

## 2017-02-28 DIAGNOSIS — R9431 Abnormal electrocardiogram [ECG] [EKG]: Secondary | ICD-10-CM | POA: Diagnosis not present

## 2017-02-28 DIAGNOSIS — I480 Paroxysmal atrial fibrillation: Secondary | ICD-10-CM

## 2017-02-28 NOTE — Progress Notes (Signed)
02/28/2017 [name]   14-Apr-1927  [id number]  Primary Physician [name], MD Primary Cardiologist: Dr. Meda Coffee Electrophysiologist: Dr. Lovena Le   Reason for Visit/CC: Routine F/u; h/o PAF and bradycardia  HPI:  84 y/o female, followed by Dr. Meda Coffee, as well as Dr. Lovena Le in device clinic, w/o a history of remote PAF, mild pulm HTN, HLD, h/o bradycardia s/p PPM, fibromyalgia, anemia, GERD, and CKD stage II. 2D echo in 2015 showed mild LVH, EF 55-60%, grade 1 DD, mild MR, severe LAE. Stress test 09/13/13 was low risk, EF 69%, no reversible ischemia. Her PPM was placed in May 2018 due to symptomatic bradycardia. BSX dual chamber PPM. Last seen by Dr. Lovena Le 09/2016 and was doing well, other than dizziness, which she has a chronic history of. It was outlined in Dr. Tanna Furry note, "today she complains of being dizzy despite a normal blood pressure and heart rate. Her dizziness is clearly related to a neurological condition. It is not related to hypotension or bradycardia". He recommended she f/u with her PCP.   Pt follows up today in general cardiology clinic. She denies CP and dyspnea. Main complaint is URI/ cough. Also had PNA back in Dec and was on antibiotics. She saw her PCP on Friday and got a repeat CXR that showed improvement/ resolving PNA. She denies fever.   EKG today shows atrial paced rhythm. 60 bpm and new anterolateral TWIs, not seen on previous EKG. Again no CP.    Current Meds  Medication Sig  . acetaminophen (TYLENOL) 500 MG tablet Take 1,000 mg by mouth every 6 (six) hours as needed for mild pain, moderate pain or headache.  . albuterol (PROVENTIL HFA;VENTOLIN HFA) 108 (90 BASE) MCG/ACT inhaler Inhale 1 puff into the lungs every 6 (six) hours as needed for wheezing or shortness of breath.  Marland Kitchen alendronate (FOSAMAX) 70 MG tablet Take 70 mg by mouth every Friday.   Marland Kitchen allopurinol (ZYLOPRIM) 100 MG tablet Take 1 tablet (100 mg total) by mouth daily.  . Artificial  Tear Ointment (DRY EYES OP) Place 1-2 drops into both eyes as needed (for dry eyes).  Marland Kitchen aspirin EC 81 MG tablet Take 81 mg by mouth at bedtime.   . chlorpheniramine-HYDROcodone (TUSSIONEX) 10-8 MG/5ML SUER Take 5 mLs by mouth 3 (three) times daily.  . cholecalciferol (VITAMIN D) 1000 units tablet Take 1,000 Units by mouth at bedtime.   . colchicine 0.6 MG tablet Take 1 tablet by mouth every other day.  Marland Kitchen DM-Phenylephrine-Acetaminophen (ALKA-SELTZER PLS SINUS & COUGH) 10-5-325 MG CAPS Take 1 capsule by mouth daily as needed.  . Ferrous Sulfate 28 MG TABS Take 28 mg by mouth at bedtime.   . furosemide (LASIX) 20 MG tablet Take 1 tablet (20 mg total) by mouth every other day.  . Glucosamine HCl (GLUCOSAMINE PO) Take 1 tablet by mouth daily.  . montelukast (SINGULAIR) 10 MG tablet Take 10 mg by mouth at bedtime.    . Multiple Vitamins-Minerals (MULTIVITAMIN WITH MINERALS) tablet Take 1 tablet by mouth daily.  . Omega-3 Fatty Acids (FISH OIL BURP-LESS) 1200 MG CAPS Take 1 capsule by mouth at bedtime.   . ondansetron (ZOFRAN) 4 MG tablet Take 4 mg by mouth every 8 (eight) hours as needed.  . pravastatin (PRAVACHOL) 20 MG tablet Take 20 mg by mouth at bedtime.   . ranitidine (ZANTAC) 150 MG tablet Take 1 tablet by mouth at bedtime.  . sertraline (ZOLOFT) 50 MG tablet Take 50 mg by mouth every evening.   Marland Kitchen  vitamin E 400 UNIT capsule Take 400 Units by mouth at bedtime.    Allergies  Allergen Reactions  . Penicillins Rash  . Amoxicillin Other (See Comments)    Very weak Has patient had a PCN reaction causing immediate rash, facial/tongue/throat swelling, SOB or lightheadedness with hypotension: no Has patient had a PCN reaction causing severe rash involving mucus membranes or skin necrosis: no Has patient had a PCN reaction that required hospitalization: no Has patient had a PCN reaction occurring within the last 10 years: no If all of the above answers are "NO", then may proceed with Cephalosporin  use.   . Diltiazem Hcl Other (See Comments)    Reaction unknown  . Sulfonamide Derivatives Other (See Comments)    Reaction unknown  . Zetia [Ezetimibe] Other (See Comments)    Weakness   Past Medical History:  Diagnosis Date  . Anemia    "I stay anemic"  . Arthritis   . Asthma    no problems recent  . Cataract   . Chronic diastolic CHF (congestive heart failure), NYHA class 2 ([place]) 03/19/2014  . CKD (chronic kidney disease), stage III ([place])   . Depression   . Diverticulitis   . DVT (deep venous thrombosis) ([place])    from birth control, left groin  . Essential hypertension   . Fibromyalgia 10 y.a.  . GERD (gastroesophageal reflux disease)   . Gout   . Hx of cardiovascular stress test    Lexiscan Myoview (8/15): apical attenuation artifact, no ischemia, EF 69% - low risk.  Marland Kitchen Hypercholesteremia   . Odynophagia   . Other abnormal glucose   . PAF (paroxysmal atrial fibrillation) ([place])   . Personal history of other diseases of digestive system   . Pneumonia    hx of  . Sinus bradycardia   . Skin cancer 2014   skin R elbow & face  . Symptomatic bradycardia 06/2016   Family History  Problem Relation Age of Onset  . Heart disease Mother   . Hypertension Mother   . Fainting Mother   . Arrhythmia Mother   . Diabetes Father   . Stroke Father   . Hypertension Father   . Cancer Brother   . Sudden death Brother   . Heart attack Brother   . Cancer Sister   . Diabetes Sister   . Asthma Sister    Past Surgical History:  Procedure Laterality Date  . ABDOMINAL HYSTERECTOMY     partial  . APPENDECTOMY  ~55years ago  . CHOLECYSTECTOMY  10/2005   Dr. Effie Shy  . COLONOSCOPY W/ BIOPSIES  2005, 2011   Dr. Bing Plume, "balloon inside for last one at [facility]"  . KNEE ARTHROSCOPY Right   . LAMINECTOMY  05/2007   foraminotomy, L4-5 laminectomy  . LARYNGOSCOPY / BRONCHOSCOPY / ESOPHAGOSCOPY  2010   Dr. Benjamine Mola  . PACEMAKER IMPLANT N/A 07/01/2016   Procedure: Pacemaker  Implant;  Surgeon: Evans Lance, MD;  Location: [place] [facility];  Service: Cardiovascular;  Laterality: N/A;  . SKIN CANCER EXCISION Right ~2005   r elbow  . TOTAL KNEE ARTHROPLASTY Left 04/04/2013   Procedure: LEFT TOTAL KNEE ARTHROPLASTY;  Surgeon: Tobi Bastos, MD;  Location: [facility];  Service: Orthopedics;  Laterality: Left;  . TUBAL LIGATION    . TUMOR REMOVAL Right ~ 20 years ago   tumor in between toes   Social History   Socioeconomic History  . Marital status: Widowed    Spouse name: Not  on file  . Number of children: Not on file  . Years of education: Not on file  . Highest education level: Not on file  Social Needs  . Financial resource strain: Not on file  . Food insecurity - worry: Not on file  . Food insecurity - inability: Not on file  . Transportation needs - medical: Not on file  . Transportation needs - non-medical: Not on file  Occupational History  . Not on file  Tobacco Use  . Smoking status: Former Smoker    Years: 10.00    Types: Cigarettes    Last attempt to quit: 02/08/1973    Years since quitting: 44.0  . Smokeless tobacco: Never Used  Substance and Sexual Activity  . Alcohol use: No  . Drug use: No  . Sexual activity: No  Other Topics Concern  . Not on file  Social History Narrative  . Not on file     Review of Systems: General: negative for chills, fever, night sweats or weight changes.  Cardiovascular: negative for chest pain, dyspnea on exertion, edema, orthopnea, palpitations, paroxysmal nocturnal dyspnea or shortness of breath Dermatological: negative for rash Respiratory: negative for cough or wheezing Urologic: negative for hematuria Abdominal: negative for nausea, vomiting, diarrhea, bright red blood per rectum, melena, or hematemesis Neurologic: negative for visual changes, syncope, or dizziness All other systems reviewed and are otherwise negative except as noted above.   Physical Exam:  Blood pressure 136/70, pulse 60,  height 5\' 3"  (1.6 m), weight 165 lb (74.8 kg).  General appearance: alert, cooperative and no distress Neck: no carotid bruit and no JVD Lungs: clear to auscultation bilaterally Heart: regular rate and rhythm, S1, S2 normal, no murmur, click, rub or gallop Extremities: extremities normal, atraumatic, no cyanosis or edema Pulses: 2+ and symmetric Skin: Skin color, texture, turgor normal. No rashes or lesions Neurologic: Grossly normal  EKG atrial paced rhythm, 60 bpm, new anterolateral TWIs -- personally reviewed   ASSESSMENT AND PLAN:   1. Sinus Node Dysfunction w/ h/o Symptomatic Bradycardia: s/p dual chamber BSX PPM, implanted 06/2016 and followed by Dr. Lovena Le.   2. Abnormal EKG: new anterolateral TWIs not seen on previous EKG. Pt denies CP and dyspnea. Has had chronic cough x 8 weeks. BP is stable.  Will get 2D echo to assess LVEF and wall motion.   Follow-Up w/ Dr. Meda Coffee in 3 months, sooner if abnormal echo results.   [name], MHS [facility] 02/28/2017 11:26 AM

## 2017-02-28 NOTE — Patient Instructions (Signed)
Medication Instructions:  Your physician recommends that you continue on your current medications as directed. Please refer to the Current Medication list given to you today.   Labwork: None ordered today  Testing/Procedures: Your physician has requested that you have an echocardiogram. Echocardiography is a painless test that uses sound waves to create images of your heart. It provides your doctor with information about the size and shape of your heart and how well your heart's chambers and valves are working. This procedure takes approximately one hour. There are no restrictions for this procedure.    Follow-Up: Your physician recommends that you keep your follow-up appointment on Jun 13, 2017 at 3:20 pm with Dr. Meda Coffee. If you can't keep this appointment or need to reschedule it, please call our office as soon as possible at [phone] to do so.    Any Other Special Instructions Will Be Listed Below (If Applicable).     If you need a refill on your cardiac medications before your next appointment, please call your pharmacy.

## 2017-03-05 DIAGNOSIS — J208 Acute bronchitis due to other specified organisms: Secondary | ICD-10-CM | POA: Diagnosis not present

## 2017-03-08 ENCOUNTER — Ambulatory Visit (HOSPITAL_COMMUNITY): Payer: Medicare Other | Attending: Cardiology

## 2017-03-08 ENCOUNTER — Other Ambulatory Visit: Payer: Self-pay

## 2017-03-08 DIAGNOSIS — J45909 Unspecified asthma, uncomplicated: Secondary | ICD-10-CM | POA: Insufficient documentation

## 2017-03-08 DIAGNOSIS — Z950 Presence of cardiac pacemaker: Secondary | ICD-10-CM | POA: Diagnosis not present

## 2017-03-08 DIAGNOSIS — N189 Chronic kidney disease, unspecified: Secondary | ICD-10-CM | POA: Insufficient documentation

## 2017-03-08 DIAGNOSIS — R9431 Abnormal electrocardiogram [ECG] [EKG]: Secondary | ICD-10-CM | POA: Diagnosis not present

## 2017-03-08 DIAGNOSIS — Z8701 Personal history of pneumonia (recurrent): Secondary | ICD-10-CM | POA: Diagnosis not present

## 2017-03-08 DIAGNOSIS — I071 Rheumatic tricuspid insufficiency: Secondary | ICD-10-CM | POA: Diagnosis not present

## 2017-03-08 DIAGNOSIS — I517 Cardiomegaly: Secondary | ICD-10-CM | POA: Diagnosis not present

## 2017-03-08 DIAGNOSIS — I480 Paroxysmal atrial fibrillation: Secondary | ICD-10-CM | POA: Insufficient documentation

## 2017-03-08 DIAGNOSIS — E7800 Pure hypercholesterolemia, unspecified: Secondary | ICD-10-CM | POA: Diagnosis not present

## 2017-03-14 DIAGNOSIS — J45901 Unspecified asthma with (acute) exacerbation: Secondary | ICD-10-CM | POA: Diagnosis not present

## 2017-03-18 DIAGNOSIS — J45901 Unspecified asthma with (acute) exacerbation: Secondary | ICD-10-CM | POA: Diagnosis not present

## 2017-03-18 DIAGNOSIS — J45998 Other asthma: Secondary | ICD-10-CM | POA: Diagnosis not present

## 2017-04-05 ENCOUNTER — Ambulatory Visit (INDEPENDENT_AMBULATORY_CARE_PROVIDER_SITE_OTHER): Payer: Medicare Other | Admitting: *Deleted

## 2017-04-05 DIAGNOSIS — I495 Sick sinus syndrome: Secondary | ICD-10-CM

## 2017-04-05 NOTE — Progress Notes (Signed)
Remote pacemaker transmission.   

## 2017-04-07 ENCOUNTER — Encounter: Payer: Self-pay | Admitting: Cardiology

## 2017-04-15 DIAGNOSIS — J45901 Unspecified asthma with (acute) exacerbation: Secondary | ICD-10-CM | POA: Diagnosis not present

## 2017-04-15 DIAGNOSIS — J45998 Other asthma: Secondary | ICD-10-CM | POA: Diagnosis not present

## 2017-04-22 LAB — CUP PACEART REMOTE DEVICE CHECK
Brady Statistic RV Percent Paced: 1 %
Date Time Interrogation Session: 20190226082200
Implantable Lead Implant Date: 20180524
Implantable Lead Location: 753860
Implantable Lead Model: 7741
Implantable Lead Serial Number: 861246
Implantable Pulse Generator Implant Date: 20180524
Lead Channel Impedance Value: 702 Ohm
Lead Channel Pacing Threshold Amplitude: 0.4 V
Lead Channel Pacing Threshold Amplitude: 0.9 V
Lead Channel Pacing Threshold Pulse Width: 0.4 ms
Lead Channel Setting Pacing Amplitude: 2 V
MDC IDC LEAD IMPLANT DT: 20180524
MDC IDC LEAD LOCATION: 753859
MDC IDC LEAD SERIAL: 693708
MDC IDC MSMT BATTERY REMAINING LONGEVITY: 114 mo
MDC IDC MSMT BATTERY REMAINING PERCENTAGE: 100 %
MDC IDC MSMT LEADCHNL RV IMPEDANCE VALUE: 555 Ohm
MDC IDC MSMT LEADCHNL RV PACING THRESHOLD PULSEWIDTH: 0.4 ms
MDC IDC SET LEADCHNL RV PACING AMPLITUDE: 2.4 V
MDC IDC SET LEADCHNL RV PACING PULSEWIDTH: 0.4 ms
MDC IDC SET LEADCHNL RV SENSING SENSITIVITY: 2 mV
MDC IDC STAT BRADY RA PERCENT PACED: 64 %
Pulse Gen Serial Number: 308044

## 2017-04-25 ENCOUNTER — Encounter: Payer: Self-pay | Admitting: Internal Medicine

## 2017-04-25 ENCOUNTER — Ambulatory Visit: Payer: Medicare Other | Admitting: Internal Medicine

## 2017-04-25 VITALS — BP 150/70 | HR 60 | Ht 63.0 in | Wt 165.0 lb

## 2017-04-25 DIAGNOSIS — K5900 Constipation, unspecified: Secondary | ICD-10-CM | POA: Insufficient documentation

## 2017-04-25 DIAGNOSIS — R8290 Unspecified abnormal findings in urine: Secondary | ICD-10-CM | POA: Insufficient documentation

## 2017-04-25 DIAGNOSIS — I480 Paroxysmal atrial fibrillation: Secondary | ICD-10-CM

## 2017-04-25 DIAGNOSIS — Z950 Presence of cardiac pacemaker: Secondary | ICD-10-CM | POA: Diagnosis not present

## 2017-04-25 DIAGNOSIS — J069 Acute upper respiratory infection, unspecified: Secondary | ICD-10-CM | POA: Insufficient documentation

## 2017-04-25 DIAGNOSIS — J3489 Other specified disorders of nose and nasal sinuses: Secondary | ICD-10-CM | POA: Insufficient documentation

## 2017-04-25 DIAGNOSIS — R001 Bradycardia, unspecified: Secondary | ICD-10-CM

## 2017-04-25 NOTE — Patient Instructions (Signed)
Medication Instructions:  Your physician recommends that you continue on your current medications as directed. Please refer to the Current Medication list given to you today.  Labwork: None ordered.  Testing/Procedures: None ordered.  Follow-Up: Your physician wants you to follow-up in: one year with Dr. Lovena Le.   You will receive a reminder letter in the mail two months in advance. If you don't receive a letter, please call our office to schedule the follow-up appointment.  Remote monitoring is used to monitor your Pacemaker from home. This monitoring reduces the number of office visits required to check your device to one time per year. It allows [place] to keep an eye on the functioning of your device to ensure it is working properly. You are scheduled for a device check from home on 07/06/2017. You may send your transmission at any time that day. If you have a wireless device, the transmission will be sent automatically. After your physician reviews your transmission, you will receive a postcard with your next transmission date.  Any Other Special Instructions Will Be Listed Below (If Applicable).  If you need a refill on your cardiac medications before your next appointment, please call your pharmacy.

## 2017-04-25 NOTE — Progress Notes (Signed)
HPI [name] returns today for ongoing evaluation and management of her PPM, sinus node dysfunction and PVC's. She denies palpitations in the interim. No chest pain, sob, or syncope. No edema.  She notes occaisional weak spells which are associated with palpitations. Allergies  Allergen Reactions  . Penicillins Rash  . Amoxicillin Other (See Comments)    Very weak Has patient had a PCN reaction causing immediate rash, facial/tongue/throat swelling, SOB or lightheadedness with hypotension: no Has patient had a PCN reaction causing severe rash involving mucus membranes or skin necrosis: no Has patient had a PCN reaction that required hospitalization: no Has patient had a PCN reaction occurring within the last 10 years: no If all of the above answers are "NO", then may proceed with Cephalosporin use.   . Diltiazem Hcl Other (See Comments)    Reaction unknown  . Sulfonamide Derivatives Other (See Comments)    Reaction unknown  . Zetia [Ezetimibe] Other (See Comments)    Weakness     Current Outpatient Medications  Medication Sig Dispense Refill  . acetaminophen (TYLENOL) 500 MG tablet Take 1,000 mg by mouth every 6 (six) hours as needed for mild pain, moderate pain or headache.    . albuterol (PROVENTIL HFA;VENTOLIN HFA) 108 (90 BASE) MCG/ACT inhaler Inhale 1 puff into the lungs every 6 (six) hours as needed for wheezing or shortness of breath. 1 Inhaler 3  . alendronate (FOSAMAX) 70 MG tablet Take 70 mg by mouth every Friday.     Marland Kitchen allopurinol (ZYLOPRIM) 100 MG tablet Take 1 tablet (100 mg total) by mouth daily. 30 tablet 6  . Artificial Tear Ointment (DRY EYES OP) Place 1-2 drops into both eyes as needed (for dry eyes).    Marland Kitchen aspirin EC 81 MG tablet Take 81 mg by mouth at bedtime.     . cholecalciferol (VITAMIN D) 1000 units tablet Take 1,000 Units by mouth at bedtime.     . colchicine 0.6 MG tablet Take 1 tablet by mouth every other day.    . Ferrous Sulfate 28 MG TABS  Take 28 mg by mouth at bedtime.     . Glucosamine HCl (GLUCOSAMINE PO) Take 1 tablet by mouth daily.    . montelukast (SINGULAIR) 10 MG tablet Take 10 mg by mouth at bedtime.      . Multiple Vitamins-Minerals (MULTIVITAMIN WITH MINERALS) tablet Take 1 tablet by mouth daily.    . Omega-3 Fatty Acids (FISH OIL BURP-LESS) 1200 MG CAPS Take 1 capsule by mouth at bedtime.     . ondansetron (ZOFRAN) 4 MG tablet Take 4 mg by mouth every 8 (eight) hours as needed.  0  . pravastatin (PRAVACHOL) 20 MG tablet Take 20 mg by mouth at bedtime.     . ranitidine (ZANTAC) 150 MG tablet Take 1 tablet by mouth at bedtime.    . sertraline (ZOLOFT) 50 MG tablet Take 50 mg by mouth every evening.     . vitamin E 400 UNIT capsule Take 400 Units by mouth at bedtime.     Marland Kitchen losartan (COZAAR) 25 MG tablet Take 1 tablet (25 mg total) by mouth daily. (Patient taking differently: Take 25 mg by mouth every evening. ) 90 tablet 3   No current facility-administered medications for this visit.      Past Medical History:  Diagnosis Date  . Anemia    "I stay anemic"  . Arthritis   . Asthma    no problems recent  . Cataract   .  Chronic diastolic CHF (congestive heart failure), NYHA class 2 ([place]) 03/19/2014  . CKD (chronic kidney disease), stage III ([place])   . Depression   . Diverticulitis   . DVT (deep venous thrombosis) ([place])    from birth control, left groin  . Essential hypertension   . Fibromyalgia 10 y.a.  . GERD (gastroesophageal reflux disease)   . Gout   . Hx of cardiovascular stress test    Lexiscan Myoview (8/15): apical attenuation artifact, no ischemia, EF 69% - low risk.  Marland Kitchen Hypercholesteremia   . Odynophagia   . Other abnormal glucose   . PAF (paroxysmal atrial fibrillation) ([place])   . Personal history of other diseases of digestive system   . Pneumonia    hx of  . Sinus bradycardia   . Skin cancer 2014   skin R elbow & face  . Symptomatic bradycardia 06/2016    ROS:   All systems reviewed and  negative except as noted in the HPI.   Past Surgical History:  Procedure Laterality Date  . ABDOMINAL HYSTERECTOMY     partial  . APPENDECTOMY  ~55years ago  . CHOLECYSTECTOMY  10/2005   Dr. Effie Shy  . COLONOSCOPY W/ BIOPSIES  2005, 2011   Dr. Bing Plume, "balloon inside for last one at [facility]"  . KNEE ARTHROSCOPY Right   . LAMINECTOMY  05/2007   foraminotomy, L4-5 laminectomy  . LARYNGOSCOPY / BRONCHOSCOPY / ESOPHAGOSCOPY  2010   Dr. Benjamine Mola  . PACEMAKER IMPLANT N/A 07/01/2016   Procedure: Pacemaker Implant;  Surgeon: Evans Lance, MD;  Location: [facility];  Service: Cardiovascular;  Laterality: N/A;  . SKIN CANCER EXCISION Right ~2005   r elbow  . TOTAL KNEE ARTHROPLASTY Left 04/04/2013   Procedure: LEFT TOTAL KNEE ARTHROPLASTY;  Surgeon: Tobi Bastos, MD;  Location: [facility];  Service: Orthopedics;  Laterality: Left;  . TUBAL LIGATION    . TUMOR REMOVAL Right ~ 20 years ago   tumor in between toes     Family History  Problem Relation Age of Onset  . Heart disease Mother   . Hypertension Mother   . Fainting Mother   . Arrhythmia Mother   . Diabetes Father   . Stroke Father   . Hypertension Father   . Cancer Brother   . Sudden death Brother   . Heart attack Brother   . Cancer Sister   . Diabetes Sister   . Asthma Sister      Social History   Socioeconomic History  . Marital status: Widowed    Spouse name: Not on file  . Number of children: Not on file  . Years of education: Not on file  . Highest education level: Not on file  Social Needs  . Financial resource strain: Not on file  . Food insecurity - worry: Not on file  . Food insecurity - inability: Not on file  . Transportation needs - medical: Not on file  . Transportation needs - non-medical: Not on file  Occupational History  . Not on file  Tobacco Use  . Smoking status: Former Smoker    Years: 10.00    Types: Cigarettes    Last attempt to quit: 02/08/1973    Years since  quitting: 44.2  . Smokeless tobacco: Never Used  Substance and Sexual Activity  . Alcohol use: No  . Drug use: No  . Sexual activity: No  Other Topics Concern  . Not on file  Social History Narrative  .  Not on file     BP (!) 150/70   Pulse 60   Ht 5\' 3"  (1.6 m)   Wt 165 lb (74.8 kg)   BMI 29.23 kg/m   Physical Exam:  Well appearing elderly woman, NAD HEENT: Unremarkable Neck:  6 cm JVD, no thyromegally Lymphatics:  No adenopathy Back:  No CVA tenderness Lungs:  Clear with no wheezes HEART:  Regular rate rhythm, no murmurs, no rubs, no clicks Abd:  soft, positive bowel sounds, no organomegally, no rebound, no guarding Ext:  2 plus pulses, no edema, no cyanosis, no clubbing Skin:  No rashes no nodules Neuro:  CN II through XII intact, motor grossly intact  DEVICE  Normal device function.  See PaceArt for details.   Assess/Plan: 1. PAF - she has had 2 hours of atrial fib. We discussed the pro's and con's of an [place]. She is not a great candidate for systemic anti-coagulation but if she has more than we will consider Eliquis. 2. PPM - her b[place] sci device is working normally. Will follow. 3. PVC's - her symptoms have resolved. She will continue her current meds.  Mikle Bosworth.D.

## 2017-04-26 ENCOUNTER — Other Ambulatory Visit: Payer: Self-pay | Admitting: Internal Medicine

## 2017-04-26 DIAGNOSIS — Z1231 Encounter for screening mammogram for malignant neoplasm of breast: Secondary | ICD-10-CM

## 2017-04-26 LAB — CUP PACEART INCLINIC DEVICE CHECK
Brady Statistic RA Percent Paced: 66 %
Brady Statistic RV Percent Paced: 1 % — CL
Date Time Interrogation Session: 20190318040000
Implantable Lead Implant Date: 20180524
Implantable Lead Implant Date: 20180524
Implantable Lead Location: 753859
Implantable Lead Location: 753860
Implantable Lead Serial Number: 861246
Lead Channel Impedance Value: 579 Ohm
Lead Channel Impedance Value: 727 Ohm
Lead Channel Pacing Threshold Amplitude: 0.7 V
Lead Channel Pacing Threshold Pulse Width: 0.4 ms
Lead Channel Sensing Intrinsic Amplitude: 3.9 mV
Lead Channel Setting Pacing Amplitude: 2.4 V
Lead Channel Setting Pacing Pulse Width: 0.4 ms
Lead Channel Setting Sensing Sensitivity: 2 mV
MDC IDC LEAD SERIAL: 693708
MDC IDC MSMT LEADCHNL RV PACING THRESHOLD AMPLITUDE: 0.8 V
MDC IDC MSMT LEADCHNL RV PACING THRESHOLD PULSEWIDTH: 0.4 ms
MDC IDC MSMT LEADCHNL RV SENSING INTR AMPL: 5.8 mV
MDC IDC PG IMPLANT DT: 20180524
MDC IDC SET LEADCHNL RA PACING AMPLITUDE: 2 V
Pulse Gen Serial Number: 308044

## 2017-05-13 ENCOUNTER — Other Ambulatory Visit: Payer: Self-pay

## 2017-05-13 ENCOUNTER — Observation Stay (HOSPITAL_COMMUNITY)
Admission: EM | Admit: 2017-05-13 | Discharge: 2017-05-16 | Disposition: A | Discharge status: Home / Self Care | Payer: Medicare HMO | Attending: Internal Medicine | Admitting: Internal Medicine

## 2017-05-13 ENCOUNTER — Emergency Department (HOSPITAL_COMMUNITY): Payer: Medicare HMO

## 2017-05-13 ENCOUNTER — Encounter (HOSPITAL_COMMUNITY): Payer: Self-pay | Admitting: Emergency Medicine

## 2017-05-13 ENCOUNTER — Telehealth: Payer: Self-pay | Admitting: Cardiology

## 2017-05-13 DIAGNOSIS — M109 Gout, unspecified: Secondary | ICD-10-CM | POA: Diagnosis not present

## 2017-05-13 DIAGNOSIS — K219 Gastro-esophageal reflux disease without esophagitis: Secondary | ICD-10-CM | POA: Diagnosis not present

## 2017-05-13 DIAGNOSIS — Z85828 Personal history of other malignant neoplasm of skin: Secondary | ICD-10-CM | POA: Diagnosis not present

## 2017-05-13 DIAGNOSIS — F329 Major depressive disorder, single episode, unspecified: Secondary | ICD-10-CM | POA: Insufficient documentation

## 2017-05-13 DIAGNOSIS — I130 Hypertensive heart and chronic kidney disease with heart failure and stage 1 through stage 4 chronic kidney disease, or unspecified chronic kidney disease: Secondary | ICD-10-CM | POA: Diagnosis not present

## 2017-05-13 DIAGNOSIS — Z888 Allergy status to other drugs, medicaments and biological substances status: Secondary | ICD-10-CM | POA: Insufficient documentation

## 2017-05-13 DIAGNOSIS — E785 Hyperlipidemia, unspecified: Secondary | ICD-10-CM | POA: Diagnosis not present

## 2017-05-13 DIAGNOSIS — Z96652 Presence of left artificial knee joint: Secondary | ICD-10-CM | POA: Diagnosis not present

## 2017-05-13 DIAGNOSIS — R001 Bradycardia, unspecified: Secondary | ICD-10-CM | POA: Diagnosis not present

## 2017-05-13 DIAGNOSIS — I4510 Unspecified right bundle-branch block: Secondary | ICD-10-CM | POA: Diagnosis not present

## 2017-05-13 DIAGNOSIS — I5032 Chronic diastolic (congestive) heart failure: Secondary | ICD-10-CM | POA: Diagnosis not present

## 2017-05-13 DIAGNOSIS — Z95810 Presence of automatic (implantable) cardiac defibrillator: Secondary | ICD-10-CM | POA: Insufficient documentation

## 2017-05-13 DIAGNOSIS — Z79899 Other long term (current) drug therapy: Secondary | ICD-10-CM | POA: Insufficient documentation

## 2017-05-13 DIAGNOSIS — I272 Pulmonary hypertension, unspecified: Secondary | ICD-10-CM | POA: Diagnosis not present

## 2017-05-13 DIAGNOSIS — N183 Chronic kidney disease, stage 3 unspecified: Secondary | ICD-10-CM | POA: Diagnosis present

## 2017-05-13 DIAGNOSIS — R509 Fever, unspecified: Secondary | ICD-10-CM | POA: Insufficient documentation

## 2017-05-13 DIAGNOSIS — D696 Thrombocytopenia, unspecified: Secondary | ICD-10-CM | POA: Diagnosis not present

## 2017-05-13 DIAGNOSIS — E7800 Pure hypercholesterolemia, unspecified: Secondary | ICD-10-CM | POA: Diagnosis not present

## 2017-05-13 DIAGNOSIS — N1832 Chronic kidney disease, stage 3b: Secondary | ICD-10-CM | POA: Diagnosis present

## 2017-05-13 DIAGNOSIS — Z86718 Personal history of other venous thrombosis and embolism: Secondary | ICD-10-CM | POA: Insufficient documentation

## 2017-05-13 DIAGNOSIS — Z66 Do not resuscitate: Secondary | ICD-10-CM | POA: Diagnosis not present

## 2017-05-13 DIAGNOSIS — M797 Fibromyalgia: Secondary | ICD-10-CM | POA: Diagnosis not present

## 2017-05-13 DIAGNOSIS — J45909 Unspecified asthma, uncomplicated: Secondary | ICD-10-CM | POA: Diagnosis not present

## 2017-05-13 DIAGNOSIS — Z7982 Long term (current) use of aspirin: Secondary | ICD-10-CM | POA: Insufficient documentation

## 2017-05-13 DIAGNOSIS — R0902 Hypoxemia: Secondary | ICD-10-CM | POA: Diagnosis not present

## 2017-05-13 DIAGNOSIS — R079 Chest pain, unspecified: Secondary | ICD-10-CM

## 2017-05-13 DIAGNOSIS — I480 Paroxysmal atrial fibrillation: Secondary | ICD-10-CM | POA: Diagnosis present

## 2017-05-13 DIAGNOSIS — N289 Disorder of kidney and ureter, unspecified: Secondary | ICD-10-CM

## 2017-05-13 DIAGNOSIS — D649 Anemia, unspecified: Secondary | ICD-10-CM | POA: Diagnosis not present

## 2017-05-13 DIAGNOSIS — Z87891 Personal history of nicotine dependence: Secondary | ICD-10-CM | POA: Insufficient documentation

## 2017-05-13 DIAGNOSIS — Z882 Allergy status to sulfonamides status: Secondary | ICD-10-CM | POA: Insufficient documentation

## 2017-05-13 DIAGNOSIS — M1990 Unspecified osteoarthritis, unspecified site: Secondary | ICD-10-CM | POA: Insufficient documentation

## 2017-05-13 DIAGNOSIS — R0789 Other chest pain: Secondary | ICD-10-CM | POA: Diagnosis not present

## 2017-05-13 DIAGNOSIS — Z880 Allergy status to penicillin: Secondary | ICD-10-CM | POA: Insufficient documentation

## 2017-05-13 LAB — CBC
HEMATOCRIT: 31.8 % — AB (ref 36.0–46.0)
Hemoglobin: 10.3 g/dL — ABNORMAL LOW (ref 12.0–15.0)
MCH: 31 pg (ref 26.0–34.0)
MCHC: 32.4 g/dL (ref 30.0–36.0)
MCV: 95.8 fL (ref 78.0–100.0)
Platelets: 120 10*3/uL — ABNORMAL LOW (ref 150–400)
RBC: 3.32 MIL/uL — ABNORMAL LOW (ref 3.87–5.11)
RDW: 13.7 % (ref 11.5–15.5)
WBC: 7.8 10*3/uL (ref 4.0–10.5)

## 2017-05-13 LAB — BASIC METABOLIC PANEL
Anion gap: 12 (ref 5–15)
BUN: 33 mg/dL — AB (ref 6–20)
CHLORIDE: 105 mmol/L (ref 101–111)
CO2: 21 mmol/L — ABNORMAL LOW (ref 22–32)
Calcium: 9.4 mg/dL (ref 8.9–10.3)
Creatinine, Ser: 1.34 mg/dL — ABNORMAL HIGH (ref 0.44–1.00)
GFR calc Af Amer: 40 mL/min — ABNORMAL LOW (ref >60)
GFR calc non Af Amer: 35 mL/min — ABNORMAL LOW (ref >60)
Glucose, Bld: 124 mg/dL — ABNORMAL HIGH (ref 65–99)
POTASSIUM: 4 mmol/L (ref 3.5–5.1)
SODIUM: 138 mmol/L (ref 135–145)

## 2017-05-13 LAB — I-STAT BETA HCG BLOOD, ED (MC, WL, AP ONLY): HCG, QUANTITATIVE: 8.9 m[IU]/mL — AB (ref <5)

## 2017-05-13 LAB — I-STAT TROPONIN, ED: Troponin i, poc: 0 ng/mL (ref 0.00–0.08)

## 2017-05-13 NOTE — ED Notes (Signed)
EKG done in triage, did not cross over in MUSE. Hardcopy available. 

## 2017-05-13 NOTE — ED Triage Notes (Signed)
Pt reports left sided chest pain and back pain starting last night and "has been in bed ill all day."  Pt has had to care for adult son who just had a heart cath.  Pt has hx of cardiac issues including "several trips to the cath lab and a pacemaker."  Pt reports weakness and thirst today.

## 2017-05-13 NOTE — Telephone Encounter (Signed)
Pt's daughter called, the pt is having chest pain, through to her back and feels bad.  She may have overdone it yesterday with her son who just had a stent and is autistic. I asked her daughter to bring to ER, could be cardiac could be muscular.  She agreed.

## 2017-05-14 ENCOUNTER — Encounter (HOSPITAL_COMMUNITY): Payer: Self-pay | Admitting: Family Medicine

## 2017-05-14 ENCOUNTER — Other Ambulatory Visit: Payer: Self-pay

## 2017-05-14 DIAGNOSIS — R509 Fever, unspecified: Secondary | ICD-10-CM | POA: Diagnosis not present

## 2017-05-14 DIAGNOSIS — N183 Chronic kidney disease, stage 3 (moderate): Secondary | ICD-10-CM | POA: Diagnosis not present

## 2017-05-14 DIAGNOSIS — N289 Disorder of kidney and ureter, unspecified: Secondary | ICD-10-CM | POA: Insufficient documentation

## 2017-05-14 DIAGNOSIS — I480 Paroxysmal atrial fibrillation: Secondary | ICD-10-CM | POA: Diagnosis not present

## 2017-05-14 DIAGNOSIS — D696 Thrombocytopenia, unspecified: Secondary | ICD-10-CM | POA: Diagnosis not present

## 2017-05-14 DIAGNOSIS — D649 Anemia, unspecified: Secondary | ICD-10-CM | POA: Diagnosis not present

## 2017-05-14 DIAGNOSIS — R079 Chest pain, unspecified: Secondary | ICD-10-CM | POA: Diagnosis not present

## 2017-05-14 LAB — I-STAT TROPONIN, ED
TROPONIN I, POC: 0.02 ng/mL (ref 0.00–0.08)
TROPONIN I, POC: 0.02 ng/mL (ref 0.00–0.08)

## 2017-05-14 LAB — URINALYSIS, ROUTINE W REFLEX MICROSCOPIC
Bilirubin Urine: NEGATIVE
Glucose, UA: NEGATIVE mg/dL
HGB URINE DIPSTICK: NEGATIVE
KETONES UR: NEGATIVE mg/dL
Leukocytes, UA: NEGATIVE
Nitrite: NEGATIVE
PH: 5 (ref 5.0–8.0)
Protein, ur: NEGATIVE mg/dL
SPECIFIC GRAVITY, URINE: 1.01 (ref 1.005–1.030)

## 2017-05-14 LAB — COMPREHENSIVE METABOLIC PANEL
ALBUMIN: 3.6 g/dL (ref 3.5–5.0)
ALT: 17 U/L (ref 14–54)
ANION GAP: 13 (ref 5–15)
AST: 22 U/L (ref 15–41)
Alkaline Phosphatase: 75 U/L (ref 38–126)
BILIRUBIN TOTAL: 1.1 mg/dL (ref 0.3–1.2)
BUN: 32 mg/dL — ABNORMAL HIGH (ref 6–20)
CO2: 21 mmol/L — AB (ref 22–32)
Calcium: 9.1 mg/dL (ref 8.9–10.3)
Chloride: 104 mmol/L (ref 101–111)
Creatinine, Ser: 1.36 mg/dL — ABNORMAL HIGH (ref 0.44–1.00)
GFR calc Af Amer: 39 mL/min — ABNORMAL LOW (ref >60)
GFR calc non Af Amer: 34 mL/min — ABNORMAL LOW (ref >60)
GLUCOSE: 134 mg/dL — AB (ref 65–99)
POTASSIUM: 3.8 mmol/L (ref 3.5–5.1)
SODIUM: 138 mmol/L (ref 135–145)
TOTAL PROTEIN: 5.9 g/dL — AB (ref 6.5–8.1)

## 2017-05-14 LAB — TROPONIN I

## 2017-05-14 LAB — LIPASE, BLOOD: Lipase: 26 U/L (ref 11–51)

## 2017-05-14 MED ORDER — MORPHINE SULFATE (PF) 4 MG/ML IV SOLN: 4.0000 mg | Freq: Once | INTRAVENOUS | Status: DC

## 2017-05-14 MED ORDER — PRAVASTATIN SODIUM 20 MG PO TABS
20.0000 mg | ORAL_TABLET | Freq: Every day | ORAL | Status: DC
  Administered 2017-05-14 – 2017-05-15 (×2): 20 mg via ORAL
  Filled 2017-05-14 (×2): qty 1

## 2017-05-14 MED ORDER — VITAMIN D 1000 UNITS PO TABS
1000.0000 [IU] | ORAL_TABLET | Freq: Every day | ORAL | Status: DC
  Administered 2017-05-14 – 2017-05-16 (×3): 1000 [IU] via ORAL
  Filled 2017-05-14 (×3): qty 1

## 2017-05-14 MED ORDER — SERTRALINE HCL 50 MG PO TABS
50.0000 mg | ORAL_TABLET | Freq: Every day | ORAL | Status: DC
  Administered 2017-05-14 – 2017-05-16 (×3): 50 mg via ORAL
  Filled 2017-05-14 (×3): qty 1

## 2017-05-14 MED ORDER — OMEGA-3-ACID ETHYL ESTERS 1 G PO CAPS
1.0000 g | ORAL_CAPSULE | Freq: Every day | ORAL | Status: DC
  Administered 2017-05-14 – 2017-05-15 (×2): 1 g via ORAL
  Filled 2017-05-14 (×3): qty 1

## 2017-05-14 MED ORDER — ASPIRIN 81 MG PO CHEW
324.0000 mg | CHEWABLE_TABLET | ORAL | Status: AC
  Administered 2017-05-14: 324 mg via ORAL
  Filled 2017-05-14: qty 4

## 2017-05-14 MED ORDER — PANTOPRAZOLE SODIUM 40 MG IV SOLR
40.0000 mg | Freq: Once | INTRAVENOUS | Status: AC
  Administered 2017-05-14: 40 mg via INTRAVENOUS
  Filled 2017-05-14: qty 40

## 2017-05-14 MED ORDER — GI COCKTAIL ~~LOC~~
30.0000 mL | Freq: Once | ORAL | Status: AC
  Administered 2017-05-14: 30 mL via ORAL
  Filled 2017-05-14: qty 30

## 2017-05-14 MED ORDER — ASPIRIN EC 81 MG PO TBEC
81.0000 mg | DELAYED_RELEASE_TABLET | Freq: Every day | ORAL | Status: DC
  Administered 2017-05-14 – 2017-05-16 (×3): 81 mg via ORAL
  Filled 2017-05-14 (×3): qty 1

## 2017-05-14 MED ORDER — NITROGLYCERIN 0.4 MG SL SUBL: 0.4000 mg | SUBLINGUAL_TABLET | SUBLINGUAL | Status: DC | PRN

## 2017-05-14 MED ORDER — ACETAMINOPHEN 325 MG PO TABS: 650.0000 mg | ORAL_TABLET | ORAL | Status: DC | PRN

## 2017-05-14 MED ORDER — MORPHINE SULFATE (PF) 4 MG/ML IV SOLN
1.0000 mg | INTRAVENOUS | Status: DC | PRN
  Administered 2017-05-14 (×2): 1 mg via INTRAVENOUS
  Filled 2017-05-14 (×2): qty 1

## 2017-05-14 MED ORDER — MONTELUKAST SODIUM 10 MG PO TABS
10.0000 mg | ORAL_TABLET | Freq: Every day | ORAL | Status: DC
  Administered 2017-05-14 – 2017-05-15 (×2): 10 mg via ORAL
  Filled 2017-05-14 (×2): qty 1

## 2017-05-14 MED ORDER — ADULT MULTIVITAMIN W/MINERALS CH
1.0000 | ORAL_TABLET | Freq: Every day | ORAL | Status: DC
Start: 2017-05-14 — End: 2017-05-16
  Administered 2017-05-14 – 2017-05-16 (×3): 1 via ORAL
  Filled 2017-05-14 (×3): qty 1

## 2017-05-14 MED ORDER — ONDANSETRON HCL 4 MG/2ML IJ SOLN: 4.0000 mg | Freq: Four times a day (QID) | INTRAMUSCULAR | Status: DC | PRN

## 2017-05-14 MED ORDER — HEPARIN SODIUM (PORCINE) 5000 UNIT/ML IJ SOLN
5000.0000 [IU] | Freq: Three times a day (TID) | INTRAMUSCULAR | Status: DC
  Administered 2017-05-14 – 2017-05-16 (×8): 5000 [IU] via SUBCUTANEOUS
  Filled 2017-05-14 (×8): qty 1

## 2017-05-14 MED ORDER — ALBUTEROL SULFATE (2.5 MG/3ML) 0.083% IN NEBU: 3.0000 mL | INHALATION_SOLUTION | Freq: Four times a day (QID) | RESPIRATORY_TRACT | Status: DC | PRN

## 2017-05-14 MED ORDER — FISH OIL BURP-LESS 1200 MG PO CAPS: 1.0000 | ORAL_CAPSULE | Freq: Every day | ORAL | Status: DC

## 2017-05-14 MED ORDER — FAMOTIDINE 20 MG PO TABS
20.0000 mg | ORAL_TABLET | Freq: Every day | ORAL | Status: DC
  Administered 2017-05-14 – 2017-05-15 (×2): 20 mg via ORAL
  Filled 2017-05-14 (×2): qty 1

## 2017-05-14 MED ORDER — MORPHINE SULFATE (PF) 4 MG/ML IV SOLN
1.0000 mg | INTRAVENOUS | Status: DC | PRN
Start: 2017-05-14 — End: 2017-05-16
  Administered 2017-05-16: 1 mg via INTRAVENOUS
  Filled 2017-05-14: qty 1

## 2017-05-14 MED ORDER — SODIUM CHLORIDE 0.9 % IV BOLUS
500.0000 mL | Freq: Once | INTRAVENOUS | Status: AC
  Administered 2017-05-14: 500 mL via INTRAVENOUS

## 2017-05-14 MED ORDER — ONDANSETRON HCL 4 MG/2ML IJ SOLN
4.0000 mg | Freq: Once | INTRAMUSCULAR | Status: AC
  Administered 2017-05-14: 4 mg via INTRAVENOUS
  Filled 2017-05-14: qty 2

## 2017-05-14 MED ORDER — TRAMADOL HCL 50 MG PO TABS
50.0000 mg | ORAL_TABLET | Freq: Two times a day (BID) | ORAL | Status: DC | PRN
  Administered 2017-05-14 – 2017-05-16 (×3): 50 mg via ORAL
  Filled 2017-05-14 (×4): qty 1

## 2017-05-14 MED ORDER — ALLOPURINOL 100 MG PO TABS
100.0000 mg | ORAL_TABLET | Freq: Every day | ORAL | Status: DC
  Administered 2017-05-14 – 2017-05-16 (×3): 100 mg via ORAL
  Filled 2017-05-14 (×3): qty 1

## 2017-05-14 MED ORDER — ASPIRIN 300 MG RE SUPP: 300.0000 mg | RECTAL | Status: AC

## 2017-05-14 NOTE — Plan of Care (Signed)
Poc initiated and progressing.  

## 2017-05-14 NOTE — ED Provider Notes (Signed)
[facility] EMERGENCY DEPARTMENT Provider Note   CSN: [id number] Arrival date & time: 05/13/17  1910     History   Chief Complaint Chief Complaint  Patient presents with  . Chest Pain  . Back Pain    HPI [name] is a 84 y.o. female.  The history is provided by the patient and a relative.  She has history of hyperlipidemia, chronic diastolic heart failure, chronic kidney disease, GERD, paroxysmal atrial fibrillation, and pacemaker for symptomatic bradycardia who comes in complaining of upper abdominal pain radiating to the lower chest and into the back.  This been present for 2 days.  She describes it as a throbbing pain which he rates at 10/10.  It is worse with movement.  She denies dyspnea and diaphoresis.  She denies nausea, but her daughter states that she had complained of nausea.  Of note, she has been taking care of her autistic son who had cardiac catheterization with stent placement was sent home because he would not do well in the hospital.  This has been very stressful for her.  She denies history of similar pain in the past.  She has not done anything to treat it at home.  Past Medical History:  Diagnosis Date  . Anemia    "I stay anemic"  . Arthritis   . Asthma    no problems recent  . Cataract   . Chronic diastolic CHF (congestive heart failure), NYHA class 2 ([place]) 03/19/2014  . CKD (chronic kidney disease), stage III ([place])   . Depression   . Diverticulitis   . DVT (deep venous thrombosis) ([place])    from birth control, left groin  . Essential hypertension   . Fibromyalgia 10 y.a.  . GERD (gastroesophageal reflux disease)   . Gout   . Hx of cardiovascular stress test    Lexiscan Myoview (8/15): apical attenuation artifact, no ischemia, EF 69% - low risk.  Marland Kitchen Hypercholesteremia   . Odynophagia   . Other abnormal glucose   . PAF (paroxysmal atrial fibrillation) ([place])   . Personal history of other diseases of digestive system   .  Pneumonia    hx of  . Sinus bradycardia   . Skin cancer 2014   skin R elbow & face  . Symptomatic bradycardia 06/2016    Patient Active Problem List   Diagnosis Date Noted  . Constipation 04/25/2017  . Urine finding 04/25/2017  . Posterior rhinorrhea 04/25/2017  . Acute upper respiratory infection 04/25/2017  . Thrombocytopenia ([place]) 01/15/2017  . CAP (community acquired pneumonia) 01/15/2017  . Symptomatic bradycardia 06/30/2016  . Hypertensive heart disease 04/01/2016  . Bradycardia 04/01/2016  . Fall 04/01/2016  . Sinus bradycardia   . CKD (chronic kidney disease), stage III ([place])   . Anemia   . Essential hypertension   . PAF (paroxysmal atrial fibrillation) ([place])   . Palpitations 09/18/2014  . DOE (dyspnea on exertion) 03/19/2014  . Chronic diastolic CHF (congestive heart failure), NYHA class 2 ([place]) 03/19/2014  . Fatigue 09/11/2013  . Gastroenteritis 07/20/2013  . Abdominal pain 07/20/2013  . Abnormal x-ray of abdomen 07/20/2013  . Diarrhea 07/20/2013  . Generalized weakness 07/20/2013  . Obesity (BMI 30-39.9) 07/20/2013  . Hypercholesteremia   . GERD (gastroesophageal reflux disease)   . Depression   . Asthma   . Gout   . Acute blood loss anemia 04/05/2013  . Osteoarthritis of left knee 04/04/2013  . Total knee replacement status 04/04/2013  . Pulmonary hypertension (  [place]) 03/07/2013  . Anemia, unspecified 03/19/2012  . Unspecified deficiency anemia 03/19/2012  . Dizziness 07/09/2010  . CHEST PAIN UNSPECIFIED 07/30/2009  . HYPERTENSION, UNSPECIFIED 08/19/2008  . PAROXYSMAL ATRIAL FIBRILLATION 08/19/2008  . GASTROESOPHAGEAL REFLUX DISEASE, HX OF 08/19/2008    Past Surgical History:  Procedure Laterality Date  . ABDOMINAL HYSTERECTOMY     partial  . APPENDECTOMY  ~55years ago  . CHOLECYSTECTOMY  10/2005   Dr. Effie Shy  . COLONOSCOPY W/ BIOPSIES  2005, 2011   Dr. Bing Plume, "balloon inside for last one at [facility]"  . KNEE ARTHROSCOPY Right   .  LAMINECTOMY  05/2007   foraminotomy, L4-5 laminectomy  . LARYNGOSCOPY / BRONCHOSCOPY / ESOPHAGOSCOPY  2010   Dr. Benjamine Mola  . PACEMAKER IMPLANT N/A 07/01/2016   Procedure: Pacemaker Implant;  Surgeon: Evans Lance, MD;  Location: [facility];  Service: Cardiovascular;  Laterality: N/A;  . SKIN CANCER EXCISION Right ~2005   r elbow  . TOTAL KNEE ARTHROPLASTY Left 04/04/2013   Procedure: LEFT TOTAL KNEE ARTHROPLASTY;  Surgeon: Tobi Bastos, MD;  Location: [facility];  Service: Orthopedics;  Laterality: Left;  . TUBAL LIGATION    . TUMOR REMOVAL Right ~ 20 years ago   tumor in between toes     OB History   None      Home Medications    Prior to Admission medications   Medication Sig Start Date End Date Taking? Authorizing Provider  acetaminophen (TYLENOL) 500 MG tablet Take 1,000 mg by mouth every 6 (six) hours as needed for mild pain, moderate pain or headache.    [provider]  albuterol (PROVENTIL HFA;VENTOLIN HFA) 108 (90 BASE) MCG/ACT inhaler Inhale 1 puff into the lungs every 6 (six) hours as needed for wheezing or shortness of breath. 11/13/14   Dorothy Spark, MD  alendronate (FOSAMAX) 70 MG tablet Take 70 mg by mouth every Friday.  02/13/14   [provider]  allopurinol (ZYLOPRIM) 100 MG tablet Take 1 tablet (100 mg total) by mouth daily. 04/01/16   Veatrice Bourbon, MD  Artificial Tear Ointment (DRY EYES OP) Place 1-2 drops into both eyes as needed (for dry eyes).    [provider]  aspirin EC 81 MG tablet Take 81 mg by mouth at bedtime.     [provider]  cholecalciferol (VITAMIN D) 1000 units tablet Take 1,000 Units by mouth at bedtime.     [provider]  colchicine 0.6 MG tablet Take 1 tablet by mouth every other day. 07/26/16   [provider]  Ferrous Sulfate 28 MG TABS Take 28 mg by mouth at bedtime.     [provider]  Glucosamine HCl (GLUCOSAMINE PO) Take 1 tablet by mouth daily.    [provider]  losartan (COZAAR) 25 MG tablet Take 1 tablet (25 mg total) by mouth daily. Patient taking differently: Take 25 mg by mouth every evening.  04/01/16 01/14/17  Dorothy Spark, MD  montelukast (SINGULAIR) 10 MG tablet Take 10 mg by mouth at bedtime.      [provider]  Multiple Vitamins-Minerals (MULTIVITAMIN WITH MINERALS) tablet Take 1 tablet by mouth daily.    [provider]  Omega-3 Fatty Acids (FISH OIL BURP-LESS) 1200 MG CAPS Take 1 capsule by mouth at bedtime.     [provider]  ondansetron (ZOFRAN) 4 MG tablet Take 4 mg by mouth every 8 (eight) hours as needed. 12/14/16   [provider]  pravastatin (PRAVACHOL) 20 MG tablet Take 20 mg by mouth at bedtime.  09/17/14   [provider]  ranitidine (ZANTAC) 150 MG tablet Take 1 tablet by mouth at bedtime. 09/24/16   [provider]  sertraline (ZOLOFT) 50 MG tablet Take 50 mg by mouth every evening.  03/15/12   [provider]  vitamin E 400 UNIT capsule Take 400 Units by mouth at bedtime.     [provider]    Family History Family History  Problem Relation Age of Onset  . Heart disease Mother   . Hypertension Mother   . Fainting Mother   . Arrhythmia Mother   . Diabetes Father   . Stroke Father   . Hypertension Father   . Cancer Brother   . Sudden death Brother   . Heart attack Brother   . Cancer Sister   . Diabetes Sister   . Asthma Sister     Social History Social History   Tobacco Use  . Smoking status: Former Smoker    Years: 10.00    Types: Cigarettes    Last attempt to quit: 02/08/1973    Years since quitting: 44.2  . Smokeless tobacco: Never Used  Substance Use Topics  . Alcohol use: No  . Drug use: No     Allergies   Penicillins; Amoxicillin; Diltiazem hcl; Sulfonamide derivatives; and Zetia [ezetimibe]   Review of Systems Review of Systems  All other systems reviewed and are negative.    Physical Exam Updated  Vital Signs BP (!) 122/49   Pulse 60   Temp 98.8 F (37.1 C)   Resp 18   Ht 5\' 3"  (1.6 m)   Wt 73.9 kg (163 lb)   SpO2 95%   BMI 28.87 kg/m   Physical Exam  Nursing note and vitals reviewed.  84 year old female, resting comfortably and in no acute distress. Vital signs are normal. Oxygen saturation is 95%, which is normal. Head is normocephalic and atraumatic. PERRLA, EOMI. Oropharynx is clear. Neck is nontender and supple without adenopathy or JVD. Back is nontender and there is no CVA tenderness. Lungs are clear without rales, wheezes, or rhonchi. Chest is nontender. Heart has regular rate and rhythm without murmur. Abdomen is soft, flat, with moderate epigastric tenderness.  There is no rebound or guarding.  There are no masses or hepatosplenomegaly and peristalsis is hypoactive. Extremities have no cyanosis or edema, full range of motion is present. Skin is warm and dry without rash. Neurologic: Mental status is normal, cranial nerves are intact, there are no motor or sensory deficits.  ED Treatments / Results  Labs (all labs ordered are listed, but only abnormal results are displayed) Labs Reviewed  BASIC METABOLIC PANEL - Abnormal; Notable for the following components:      Result Value   CO2 21 (*)    Glucose, Bld 124 (*)    BUN 33 (*)    Creatinine, Ser 1.34 (*)    GFR calc non Af Amer 35 (*)    GFR calc Af Amer 40 (*)    All other components within normal limits  CBC - Abnormal; Notable for the following components:   RBC 3.32 (*)    Hemoglobin 10.3 (*)    HCT 31.8 (*)    Platelets 120 (*)    All other components within normal limits  I-STAT BETA HCG BLOOD, ED ([facility], [facility], [facility] ONLY) - Abnormal; Notable for the following components:   I-stat hCG, quantitative  8.9 (*)    All other components within normal limits  URINE CULTURE  URINALYSIS, ROUTINE W REFLEX MICROSCOPIC  I-STAT TROPONIN, ED  I-STAT TROPONIN, ED  I-STAT TROPONIN, ED    EKG Atrial paced rhythm  60 bpm.  Left axis deviation.  Normal intervals.  Normal ST and T wave.  When compared with ECG of February 28, 2017, T waves are no longer inverted in the anterior leads.  Radiology Dg Chest 2 View  Result Date: 05/13/2017 CLINICAL DATA:  Chest pain. EXAM: CHEST - 2 VIEW COMPARISON:  Chest x-ray dated February 23, 2017. FINDINGS: Unchanged left chest wall AICD. Stable mild cardiomegaly. Normal pulmonary vascularity. Low lung volumes with bibasilar atelectasis. No focal consolidation, pleural effusion, or pneumothorax. No acute osseous abnormality. IMPRESSION: Low lung volumes with bibasilar atelectasis. No active cardiopulmonary disease. Electronically Signed   By: Titus Dubin M.D.   On: 05/13/2017 20:09    Procedures Procedures   Medications Ordered in ED Medications  pantoprazole (PROTONIX) injection 40 mg (has no administration in time range)  morphine 4 MG/ML injection 4 mg (has no administration in time range)  ondansetron (ZOFRAN) injection 4 mg (has no administration in time range)  gi cocktail (Maalox,Lidocaine,Donnatal) (30 mLs Oral Given 05/14/17 0126)     Initial Impression / Assessment and Plan / ED Course  I have reviewed the triage vital signs and the nursing notes.  Pertinent labs & imaging results that were available during my care of the patient were reviewed by me and considered in my medical decision making (see chart for details).  Chest abdominal pain which seems much more likely to be gastrointestinal in origin than cardiovascular.  ECG shows no acute changes, and troponin is negative x2.  Chest x-ray is unremarkable.  Old records are reviewed, and she is followed by cardiology.  Last stress test was in October 2019 and was low risk.  Will give trial of a GI cocktail.  She states no improvement with GI cocktail.  I still have strong suspicion that the pain is GI in origin, but am concerned about persistence of pain.  She is given dose of pantoprazole and morphine.   Case is discussed with Dr. Myna Hidalgo of [facility] hospital[facility], who agrees to admit the patient.  Final Clinical Impressions(s) / ED Diagnoses   Final diagnoses:  Chest pain, unspecified type  Renal insufficiency  Thrombocytopenia (HCC)  Normochromic normocytic anemia    ED Discharge Orders    None       Delora Fuel, MD 00/45/99 [phone]

## 2017-05-14 NOTE — ED Notes (Signed)
Pt asked about lunch. Pt's lunch tray ordered at 11:55. Informed pt.

## 2017-05-14 NOTE — Progress Notes (Addendum)
Patient and family have all stated that they never wanted patient to be a DNR. They want status changed immediately to show Full Code. Status Full Code has been explained to family and they understand what a Full Code means. Notified Provider of family wishes so order can be changed.

## 2017-05-14 NOTE — Progress Notes (Signed)
Patient arrived to the unit. Patient is Alert and Oriented. Placed patient on 2L of O2 as her sats were 88. Patient is resting comfortably in bed with daughter by the bedside. Patient has been placed on telemetry and will continue to monitor.

## 2017-05-14 NOTE — ED Notes (Signed)
DNR armband placed on R wrist

## 2017-05-14 NOTE — Progress Notes (Signed)
PROGRESS NOTE   [name]  KGU:[id number]    DOB: 25-Mar-1927    DOA: 05/13/2017  PCP: [name], MD   I have briefly reviewed patients previous medical records in [facility].  Brief Narrative:  84 year old female, her family (daughter, son-in-law and handicapped son) live with her, independent at home but at times uses a cane when she gets out of her house, PMH of chronic diastolic CHF (Dr. Meda Coffee, Cardiology), PAF not on anticoagulation, symptomatic bradycardia status post PPM (Dr. Lovena Le, EP Cardiology), stage III chronic kidney disease, HTN, HLD, GERD presented to the ED with complaints of atypical chest pain.  Admitted for observation and further evaluation.   Assessment & Plan:   Principal Problem:   Chest pain Active Problems:   Anemia, unspecified   CKD (chronic kidney disease), stage III (HCC)   Normochromic normocytic anemia   PAF (paroxysmal atrial fibrillation) (HCC)   Fever   Atypical Chest pain: Unclear etiology.?  Musculoskeletal versus others.  Patient reports some increased physical activity while caring for her disabled son who was recently brought to the ED and feels that she might have pulled something while attending to him.  EKG and chest x-ray without acute findings.  POC troponin x2: Negative and troponin x 1: Negative.  Patient had low risk stress test 09/14/13.  TTE 03/08/17: LVEF 55-60%.  Continue to cycle troponin.  Continue aspirin 81 mg daily, pravastatin 20 mg at bedtime.  Treat supportively with pain medicines.  Fever: Low-grade fever noted in ED without symptoms or signs to suggest source.  Chest x-ray negative.  Monitor off of antibiotics.  Essential hypertension: Intermittent soft blood pressures.  ARB temporarily held.  Paroxysmal atrial fibrillation: Currently in sinus rhythm.  As per last cardiology office visit 04/25/17, anticoagulation was discussed with patient and she was not felt to be a great candidate for systemic  anticoagulation but if she had more episodes then considering Eliquis.  Status post permanent pacemaker: During last office visit with EP cardiology, PPM was working normally.  Stage III chronic kidney disease: Creatinine seems to be at baseline/1.3 range.  Normocytic anemia/thrombocytopenia, chronic: Stable.  GERD: Symptoms not consistent with GERD.  Continue Pepcid.   DVT prophylaxis: Subcutaneous heparin Code Status: DNR Family Communication: None at bedside Disposition: DC home pending clinical improvement and negative workup, possibly 4/7.   Consultants:  None  Procedures:  None  Antimicrobials:  None   Subjective: This morning in the ED.  She states that 2 days PTA, her disabled son had to be brought to the emergency department and upon discharge, he was somewhat not his usual self and needed to be attended/assistance and wonders if she may have pulled something in her chest.  Indicates lower midsternal chest discomfort, radiating underneath both breasts and occasionally to the back, aching, 10/10 in severity at times, worse with deep breathing and at times with chest wall movement.  Partially reproducible to pressure.  Improved after IV morphine in ED.  Discussed with ED RN.  ROS: As above.  Objective:  Vitals:   05/14/17 0730 05/14/17 0800 05/14/17 0900 05/14/17 0930  BP: 94/63 (!) 114/44 (!) 103/47 104/60  Pulse: 61 (!) 59 (!) 59 62  Resp: (!) 23 19 20 15   Temp:      TempSrc:      SpO2: 95% 93% 91% 98%  Weight:      Height:        Examination:  General exam: Pleasant elderly female, moderately built and  nourished, lying comfortably supine in bed. Respiratory system: Clear to auscultation. Respiratory effort normal.  Partially reproducible lower midsternal tenderness. Cardiovascular system: S1 & S2 heard, RRR. No JVD, murmurs, rubs, gallops or clicks. No pedal edema.  Telemetry personally reviewed: Sinus rhythm. Gastrointestinal system: Abdomen is  nondistended, soft and nontender. No organomegaly or masses felt. Normal bowel sounds heard. Central nervous system: Alert and oriented. No focal neurological deficits. Extremities: Symmetric 5 x 5 power. Skin: No rashes, lesions or ulcers Psychiatry: Judgement and insight appear normal. Mood & affect appropriate.     Data Reviewed: I have personally reviewed following labs and imaging studies  CBC: Recent Labs  Lab 05/13/17 1935  WBC 7.8  HGB 10.3*  HCT 31.8*  MCV 95.8  PLT 818*   Basic Metabolic Panel: Recent Labs  Lab 05/13/17 1935 05/14/17 0435  NA 138 138  K 4.0 3.8  CL 105 104  CO2 21* 21*  GLUCOSE 124* 134*  BUN 33* 32*  CREATININE 1.34* 1.36*  CALCIUM 9.4 9.1   Liver Function Tests: Recent Labs  Lab 05/14/17 0435  AST 22  ALT 17  ALKPHOS 75  BILITOT 1.1  PROT 5.9*  ALBUMIN 3.6   Cardiac Enzymes: Recent Labs  Lab 05/14/17 0426  TROPONINI <0.03      Radiology Studies: Dg Chest 2 View  Result Date: 05/13/2017 CLINICAL DATA:  Chest pain. EXAM: CHEST - 2 VIEW COMPARISON:  Chest x-ray dated February 23, 2017. FINDINGS: Unchanged left chest wall AICD. Stable mild cardiomegaly. Normal pulmonary vascularity. Low lung volumes with bibasilar atelectasis. No focal consolidation, pleural effusion, or pneumothorax. No acute osseous abnormality. IMPRESSION: Low lung volumes with bibasilar atelectasis. No active cardiopulmonary disease. Electronically Signed   By: Titus Dubin M.D.   On: 05/13/2017 20:09        Scheduled Meds: . allopurinol  100 mg Oral Daily  . aspirin EC  81 mg Oral Daily  . cholecalciferol  1,000 Units Oral Daily  . famotidine  20 mg Oral QHS  . heparin  5,000 Units Subcutaneous Q8H  . montelukast  10 mg Oral QHS  . multivitamin with minerals  1 tablet Oral Daily  . omega-3 acid ethyl esters  1 g Oral Daily  . pravastatin  20 mg Oral q1800  . sertraline  50 mg Oral Daily   Continuous Infusions:   LOS: 0 days     Vernell Leep, MD, FACP, [facility]. [facility] Pager [phone]  If 7PM-7AM, please contact night-coverage [email or web address] Password [email or web address] 05/14/2017, 11:44 AM

## 2017-05-14 NOTE — ED Notes (Signed)
Pt's lunch tray arrived. 

## 2017-05-14 NOTE — H&P (Signed)
History and Physical    [name] [id number]:[id number] DOB: Jun 15, 1927 DOA: 05/13/2017  PCP: [name], MD   Patient coming from: Home  Chief Complaint: Chest pain   HPI: [name] is a 84 y.o. female with medical history significant for chronic kidney disease stage III, hypertension, GERD, paroxysmal atrial fibrillation not anticoagulated, and history of symptomatic bradycardia with pacemaker, now presenting to the emergency department for evaluation of chest pain.  Patient had been exerting herself more than usual on 05/12/2017 and developed pain in the lower chest that evening.  Patient reports constant pain in the lower middle chest since that time.  She has not noted any alleviating factors, but reports that the pain is worse with activity.  She denies shortness of breath or cough.  She denies abdominal pain, nausea, vomiting, or diarrhea.  No rhinorrhea or sore throat.  ED Course: Upon arrival to the ED, patient is found to be febrile to 38.2 C, saturating well on room air, blood pressure 100/55, and vitals otherwise normal.  EKG features a sinus rhythm and chest x-ray is and stable chronic thrombocytopenia with platelets 120,000.  Troponin is negative for acute cardiopulmonary disease.  Chemistry panel is notable for creatinine 1.34, consistent with her baseline.  CBC features a stable chronic normocytic anemia with hemoglobin of 10.3 normal x2.  Patient was treated with 40 mg IV Protonix, GI cocktail, and morphine in the ED.  There was no relief with Protonix for GI cocktail.  She remains stable in the emergency department and will be observed on the telemetry unit for ongoing evaluation and management of atypical chest pain with fever.  Review of Systems:  All other systems reviewed and apart from HPI, are negative.  Past Medical History:  Diagnosis Date  . Anemia    "I stay anemic"  . Arthritis   . Asthma    no problems recent  . Cataract   . Chronic diastolic CHF  (congestive heart failure), NYHA class 2 ([place]) 03/19/2014  . CKD (chronic kidney disease), stage III ([place])   . Depression   . Diverticulitis   . DVT (deep venous thrombosis) ([place])    from birth control, left groin  . Essential hypertension   . Fibromyalgia 10 y.a.  . GERD (gastroesophageal reflux disease)   . Gout   . Hx of cardiovascular stress test    Lexiscan Myoview (8/15): apical attenuation artifact, no ischemia, EF 69% - low risk.  Marland Kitchen Hypercholesteremia   . Odynophagia   . Other abnormal glucose   . PAF (paroxysmal atrial fibrillation) ([place])   . Personal history of other diseases of digestive system   . Pneumonia    hx of  . Sinus bradycardia   . Skin cancer 2014   skin R elbow & face  . Symptomatic bradycardia 06/2016    Past Surgical History:  Procedure Laterality Date  . ABDOMINAL HYSTERECTOMY     partial  . APPENDECTOMY  ~55years ago  . CHOLECYSTECTOMY  10/2005   Dr. Effie Shy  . COLONOSCOPY W/ BIOPSIES  2005, 2011   Dr. Bing Plume, "balloon inside for last one at [facility]"  . KNEE ARTHROSCOPY Right   . LAMINECTOMY  05/2007   foraminotomy, L4-5 laminectomy  . LARYNGOSCOPY / BRONCHOSCOPY / ESOPHAGOSCOPY  2010   Dr. Benjamine Mola  . PACEMAKER IMPLANT N/A 07/01/2016   Procedure: Pacemaker Implant;  Surgeon: Evans Lance, MD;  Location: [facility];  Service: Cardiovascular;  Laterality: N/A;  . SKIN  CANCER EXCISION Right ~2005   r elbow  . TOTAL KNEE ARTHROPLASTY Left 04/04/2013   Procedure: LEFT TOTAL KNEE ARTHROPLASTY;  Surgeon: Tobi Bastos, MD;  Location: [facility];  Service: Orthopedics;  Laterality: Left;  . TUBAL LIGATION    . TUMOR REMOVAL Right ~ 20 years ago   tumor in between toes     reports that she quit smoking about 44 years ago. Her smoking use included cigarettes. She quit after 10.00 years of use. She has never used smokeless tobacco. She reports that she does not drink alcohol or use drugs.  Allergies  Allergen Reactions  . Penicillins  Rash  . Amoxicillin Other (See Comments)    Very weak Has patient had a PCN reaction causing immediate rash, facial/tongue/throat swelling, SOB or lightheadedness with hypotension: no Has patient had a PCN reaction causing severe rash involving mucus membranes or skin necrosis: no Has patient had a PCN reaction that required hospitalization: no Has patient had a PCN reaction occurring within the last 10 years: no If all of the above answers are "NO", then may proceed with Cephalosporin use.   . Diltiazem Hcl Other (See Comments)    Reaction unknown  . Sulfonamide Derivatives Other (See Comments)    Reaction unknown  . Zetia [Ezetimibe] Other (See Comments)    Weakness    Family History  Problem Relation Age of Onset  . Heart disease Mother   . Hypertension Mother   . Fainting Mother   . Arrhythmia Mother   . Diabetes Father   . Stroke Father   . Hypertension Father   . Cancer Brother   . Sudden death Brother   . Heart attack Brother   . Cancer Sister   . Diabetes Sister   . Asthma Sister      Prior to Admission medications   Medication Sig Start Date End Date Taking? Authorizing Provider  acetaminophen (TYLENOL) 500 MG tablet Take 1,000 mg by mouth every 6 (six) hours as needed for mild pain, moderate pain or headache.   Yes [provider]  albuterol (PROVENTIL HFA;VENTOLIN HFA) 108 (90 BASE) MCG/ACT inhaler Inhale 1 puff into the lungs every 6 (six) hours as needed for wheezing or shortness of breath. 11/13/14  Yes Dorothy Spark, MD  alendronate (FOSAMAX) 70 MG tablet Take 70 mg by mouth once a week. On Friday 02/13/14  Yes [provider]  allopurinol (ZYLOPRIM) 100 MG tablet Take 1 tablet (100 mg total) by mouth daily. 04/01/16  Yes Haney, Alyssa A, MD  aspirin EC 81 MG tablet Take 81 mg by mouth daily.    Yes [provider]  cholecalciferol (VITAMIN D) 1000 units tablet Take 1,000 Units by mouth daily.    Yes [provider]    colchicine 0.6 MG tablet Take 1 tablet by mouth every other day. 07/26/16  Yes [provider]  Ferrous Sulfate 28 MG TABS Take 28 mg by mouth daily.    Yes [provider]  losartan (COZAAR) 25 MG tablet Take 1 tablet (25 mg total) by mouth daily. 04/01/16 05/14/24 Yes Dorothy Spark, MD  montelukast (SINGULAIR) 10 MG tablet Take 10 mg by mouth at bedtime.     Yes [provider]  Multiple Vitamins-Minerals (MULTIVITAMIN WITH MINERALS) tablet Take 1 tablet by mouth daily.   Yes [provider]  Omega-3 Fatty Acids (FISH OIL BURP-LESS) 1200 MG CAPS Take 1 capsule by mouth daily.    Yes [provider]  pravastatin (PRAVACHOL) 20 MG tablet Take 20 mg by mouth at bedtime.  09/17/14  Yes [provider]  ranitidine (ZANTAC) 150 MG tablet Take 1 tablet by mouth at bedtime. 09/24/16  Yes [provider]  sertraline (ZOLOFT) 50 MG tablet Take 50 mg by mouth daily.  03/15/12  Yes [provider]  vitamin E 400 UNIT capsule Take 400 Units by mouth daily.    Yes [provider]    Physical Exam: Vitals:   05/14/17 0053 05/14/17 0130 05/14/17 0200 05/14/17 0230  BP: (!) 122/49 (!) 104/92 (!) 101/55 (!) 103/47  Pulse: 60 68 (!) 52 60  Resp: 18  16   Temp: 98.8 F (37.1 C)     TempSrc:      SpO2: 95% 100% 96% 93%  Weight:      Height:          Constitutional: NAD, calm  Eyes: PERTLA, lids and conjunctivae normal ENMT: Mucous membranes are moist. Posterior pharynx clear of any exudate or lesions.   Neck: normal, supple, no masses, no thyromegaly Respiratory: clear to auscultation bilaterally, no wheezing, no crackles. Normal respiratory effort.    Cardiovascular: S1 & S2 heard, regular rate and rhythm. No significant JVD. Abdomen: No distension, no tenderness, soft. Bowel sounds normal.  Musculoskeletal: no clubbing / cyanosis. No joint deformity upper and lower extremities.   Skin: no significant rashes, lesions,  ulcers. Warm, dry, well-perfused. Neurologic: CN 2-12 grossly intact. Sensation intact. Strength 5/5 in all 4 limbs.  Psychiatric: Alert and oriented x 3. Pleasant, cooperative.     Labs on Admission: I have personally reviewed following labs and imaging studies  CBC: Recent Labs  Lab 05/13/17 1935  WBC 7.8  HGB 10.3*  HCT 31.8*  MCV 95.8  PLT 161*   Basic Metabolic Panel: Recent Labs  Lab 05/13/17 1935  NA 138  K 4.0  CL 105  CO2 21*  GLUCOSE 124*  BUN 33*  CREATININE 1.34*  CALCIUM 9.4   GFR: Estimated Creatinine Clearance: 28.5 mL/min (A) (by C-G formula based on SCr of 1.34 mg/dL (H)). Liver Function Tests: No results for input(s): AST, ALT, ALKPHOS, BILITOT, PROT, ALBUMIN in the last 168 hours. No results for input(s): LIPASE, AMYLASE in the last 168 hours. No results for input(s): AMMONIA in the last 168 hours. Coagulation Profile: No results for input(s): INR, PROTIME in the last 168 hours. Cardiac Enzymes: No results for input(s): CKTOTAL, CKMB, CKMBINDEX, TROPONINI in the last 168 hours. BNP (last 3 results) No results for input(s): PROBNP in the last 8760 hours. HbA1C: No results for input(s): HGBA1C in the last 72 hours. CBG: No results for input(s): GLUCAP in the last 168 hours. Lipid Profile: No results for input(s): CHOL, HDL, LDLCALC, TRIG, CHOLHDL, LDLDIRECT in the last 72 hours. Thyroid Function Tests: No results for input(s): TSH, T4TOTAL, FREET4, T3FREE, THYROIDAB in the last 72 hours. Anemia Panel: No results for input(s): VITAMINB12, FOLATE, FERRITIN, TIBC, IRON, RETICCTPCT in the last 72 hours. Urine analysis:    Component Value Date/Time   COLORURINE YELLOW 07/20/2013 [place] 07/20/2013 1457   LABSPEC 1.011 07/20/2013 1457   PHURINE 6.0 07/20/2013 1457   GLUCOSEU NEGATIVE 07/20/2013 1457   HGBUR NEGATIVE 07/20/2013 1457   BILIRUBINUR NEGATIVE 07/20/2013 1457   KETONESUR NEGATIVE 07/20/2013 1457   PROTEINUR  NEGATIVE 07/20/2013 1457   UROBILINOGEN 0.2 07/20/2013 1457   NITRITE NEGATIVE 07/20/2013 1457   LEUKOCYTESUR NEGATIVE 07/20/2013 1457   Sepsis Labs: @  LABRCNTIP(procalcitonin:4,lacticidven:4) )No results found for this or any previous visit (from the past 240 hour(s)).   Radiological Exams on Admission: Dg Chest 2 View  Result Date: 05/13/2017 CLINICAL DATA:  Chest pain. EXAM: CHEST - 2 VIEW COMPARISON:  Chest x-ray dated February 23, 2017. FINDINGS: Unchanged left chest wall AICD. Stable mild cardiomegaly. Normal pulmonary vascularity. Low lung volumes with bibasilar atelectasis. No focal consolidation, pleural effusion, or pneumothorax. No acute osseous abnormality. IMPRESSION: Low lung volumes with bibasilar atelectasis. No active cardiopulmonary disease. Electronically Signed   By: Titus Dubin M.D.   On: 05/13/2017 20:09    EKG: Independently reviewed. Sinus rhythm, similar to priors.   Assessment/Plan  1. Chest pain  - Presents with 2 days of chest pain and is noted to have fever on admission  - EKG similar to priors, CXR without edema or consolidation, troponin negative  - GI-etiology suspected by ED physician, but no relief with PPI or GI cocktail  - Infectious etiology considered given the fever, but no respiratory complaints, lungs clear to auscultation, and she is s/p cholecystectomy with benign abdominal exam  - Treat with ASA 324 mg, continue statin, hold ARB until BP improves, continue cardiac monitoring, obtain serial troponin measurements, repeat EKG    2. Fever  - Fever noted on arrival to ED  - No leukocytosis; no SOB or cough, no upper respiratory sxs, no abd pain, no meningismus, no wounds or rash, she is s/p cholecystectomy  - She does not appear septic  - Culture, check UA, continue supportive care   3. CKD stage III  - SCr is 1.34 on admission, consistent with her apparent baseline  - Renally-dose medications, avoid nephrotoxins    4. Paroxysmal atrial  fibrillation  - In a sinus rhythm on admission  - CHADS-VASc 5 (age x2, gender, CHF, HTN)  - She follows with cardiology and has not been anticoagulated d/t only rare episodes of AF   5. Anemia; thrombocytopenia  - Hgb is 10.3 on admission and platelets 120,000  - Both indices appear to be stable and there is no bleeding  - She will continue iron-supplementation    6. Hypertension  - BP is at low end of normal in ED and losartan held on admission    DVT prophylaxis: sq heparin  Code Status: DNR Family Communication: Daughter updated at bedside Consults called: None Admission status: Observation    Vianne Bulls, MD [facility] Pager [phone]  If 7PM-7AM, please contact night-coverage [email or web address] Password [email or web address]  05/14/2017, 4:10 AM

## 2017-05-14 NOTE — ED Notes (Signed)
Attempted two IV unsuccessful at this time.

## 2017-05-15 DIAGNOSIS — R509 Fever, unspecified: Secondary | ICD-10-CM | POA: Diagnosis not present

## 2017-05-15 DIAGNOSIS — I480 Paroxysmal atrial fibrillation: Secondary | ICD-10-CM | POA: Diagnosis not present

## 2017-05-15 DIAGNOSIS — R079 Chest pain, unspecified: Secondary | ICD-10-CM | POA: Diagnosis not present

## 2017-05-15 DIAGNOSIS — N183 Chronic kidney disease, stage 3 (moderate): Secondary | ICD-10-CM | POA: Diagnosis not present

## 2017-05-15 DIAGNOSIS — K219 Gastro-esophageal reflux disease without esophagitis: Secondary | ICD-10-CM | POA: Diagnosis not present

## 2017-05-15 DIAGNOSIS — E785 Hyperlipidemia, unspecified: Secondary | ICD-10-CM | POA: Diagnosis not present

## 2017-05-15 DIAGNOSIS — Z66 Do not resuscitate: Secondary | ICD-10-CM | POA: Diagnosis not present

## 2017-05-15 DIAGNOSIS — I5032 Chronic diastolic (congestive) heart failure: Secondary | ICD-10-CM | POA: Diagnosis not present

## 2017-05-15 DIAGNOSIS — R0789 Other chest pain: Secondary | ICD-10-CM | POA: Diagnosis not present

## 2017-05-15 DIAGNOSIS — D649 Anemia, unspecified: Secondary | ICD-10-CM | POA: Diagnosis not present

## 2017-05-15 DIAGNOSIS — I130 Hypertensive heart and chronic kidney disease with heart failure and stage 1 through stage 4 chronic kidney disease, or unspecified chronic kidney disease: Secondary | ICD-10-CM | POA: Diagnosis not present

## 2017-05-15 DIAGNOSIS — D696 Thrombocytopenia, unspecified: Secondary | ICD-10-CM | POA: Diagnosis not present

## 2017-05-15 DIAGNOSIS — R001 Bradycardia, unspecified: Secondary | ICD-10-CM | POA: Diagnosis not present

## 2017-05-15 LAB — D-DIMER, QUANTITATIVE: D-Dimer, Quant: 0.76 ug/mL-FEU — ABNORMAL HIGH (ref 0.00–0.50)

## 2017-05-15 NOTE — Progress Notes (Signed)
PROGRESS NOTE   KHRISTA BRAUN  ZOX:[id number]    DOB: 09-22-1927    DOA: 05/13/2017  PCP: [name], MD   I have briefly reviewed patients previous medical records in [facility].  Brief Narrative:  84 year old female, her family (daughter, son-in-law and handicapped son) live with her, independent at home but at times uses a cane when she gets out of her house, PMH of chronic diastolic CHF (Dr. Meda Coffee, Cardiology), PAF not on anticoagulation, symptomatic bradycardia status post PPM (Dr. Lovena Le, EP Cardiology), stage III chronic kidney disease, HTN, HLD, GERD presented to the ED with complaints of atypical chest pain.  Admitted for observation and further evaluation.   Assessment & Plan:   Principal Problem:   Chest pain Active Problems:   Anemia, unspecified   CKD (chronic kidney disease), stage III (HCC)   Normochromic normocytic anemia   PAF (paroxysmal atrial fibrillation) (HCC)   Fever   Atypical Chest pain: Unclear etiology.?  Musculoskeletal versus others.  Patient reports some increased physical activity while caring for her disabled son who was recently brought to the ED and feels that she might have pulled something while attending to him.  EKG and chest x-ray without acute findings.  POC troponin x2: Negative and troponin x 2: Negative.  Patient had low risk stress test 09/14/13.  TTE 03/08/17: LVEF 55-60%. Continue aspirin 81 mg daily, pravastatin 20 mg at bedtime.  Treat supportively with pain medicines.  Pain improved with Tylenol & tramadol.  Noted hypoxia on room air with activity by PT today.  Discussed in detail with patient's daughter who is a PA.  Advised that her chest pain clinically seems musculoskeletal in nature.  However given hypoxia, some concern regarding PE but low index of suspicion.  Hypoxia may be related to atelectasis, poor inspiratory effort due to pain and possible COPD (apparently a physician has told her that she has COPD and another one  said not and she was advised PFT which have not been done).  Not sure how mobile she was pta.  Discussed options of treating supportively with pain medicines, incentive spirometry, home with oxygen and outpatient follow-up with PCP versus checking d-dimer and if positive pursue VQ scan since unable to do CTA chest due to CKD and risk of worsening.  They opted to check d-dimer which turned out positive.  VQ scan ordered and will follow.  Repeat EKG from today shows atrial paced rhythm, LAD, incomplete RBBB, nonspecific ST-T changes and no acute abnormalities.  QTc 390 ms.  Fever: Low-grade fever noted in ED without symptoms or signs to suggest source.  Chest x-ray negative.  Monitor off of antibiotics.  No recurrence of fevers.  Essential hypertension: Intermittent soft blood pressures.  ARB temporarily held.  Stable.  Paroxysmal atrial fibrillation: Currently in sinus rhythm.  As per last cardiology office visit 04/25/17, anticoagulation was discussed with patient and she was not felt to be a great candidate for systemic anticoagulation but if she had more episodes then considering Eliquis.  Status post permanent pacemaker: During last office visit with EP cardiology, PPM was working normally.  Stage III chronic kidney disease: Creatinine seems to be at baseline/1.3 range.  Normocytic anemia/thrombocytopenia, chronic: Stable.  GERD: Symptoms not consistent with GERD.  Continue Pepcid.   DVT prophylaxis: Subcutaneous heparin Code Status: Patient and family changed CODE STATUS on 4/6 to full code. Family Communication: Discussed in detail with patient's daughter who is a Librarian, academic. Disposition: DC home pending clinical improvement and  negative workup, possibly 4/8   Consultants:  None  Procedures:  None  Antimicrobials:  None   Subjective: Chest pain improved after tramadol.  Indicates chest pain is worse with certain activity such as moving her chest wall from side-to-side  or deep breath.  No dyspnea.  No cough reported.  As per daughter, patient has issues with intermittent dizziness while ambulating at home.  Orthostatics checked and negative.  Patient was interviewed and examined with RN in room.  ROS: As above.  Objective:  Vitals:   05/15/17 1603 05/15/17 1638 05/15/17 1641 05/15/17 1643  BP: (!) 106/48     Pulse: 60 (!) 59    Resp: 18 13    Temp: 98.2 F (36.8 C)     TempSrc: Oral     SpO2: 98% 98% 98% 95%  Weight:      Height:        Examination:  General exam: Pleasant elderly female, moderately built and nourished, lying comfortably supine in bed. Respiratory system: Clear to auscultation. Respiratory effort normal.  Partially reproducible lower midsternal tenderness.  Stable without change. Cardiovascular system: S1 & S2 heard, RRR. No JVD, murmurs, rubs, gallops or clicks. No pedal edema.  Telemetry personally reviewed: SR.  An episode of nonsustained SVT on 4/6 at 10:05 PM. Gastrointestinal system: Abdomen is nondistended, soft and nontender. No organomegaly or masses felt. Normal bowel sounds heard.  Stable. Central nervous system: Alert and oriented. No focal neurological deficits. Extremities: Symmetric 5 x 5 power. Skin: No rashes, lesions or ulcers Psychiatry: Judgement and insight appear normal. Mood & affect appropriate.     Data Reviewed: I have personally reviewed following labs and imaging studies  CBC: Recent Labs  Lab 05/13/17 1935  WBC 7.8  HGB 10.3*  HCT 31.8*  MCV 95.8  PLT 106*   Basic Metabolic Panel: Recent Labs  Lab 05/13/17 1935 05/14/17 0435  NA 138 138  K 4.0 3.8  CL 105 104  CO2 21* 21*  GLUCOSE 124* 134*  BUN 33* 32*  CREATININE 1.34* 1.36*  CALCIUM 9.4 9.1   Liver Function Tests: Recent Labs  Lab 05/14/17 0435  AST 22  ALT 17  ALKPHOS 75  BILITOT 1.1  PROT 5.9*  ALBUMIN 3.6   Cardiac Enzymes: Recent Labs  Lab 05/14/17 0426 05/14/17 1051  TROPONINI <0.03 <0.03       Radiology Studies: Dg Chest 2 View  Result Date: 05/13/2017 CLINICAL DATA:  Chest pain. EXAM: CHEST - 2 VIEW COMPARISON:  Chest x-ray dated February 23, 2017. FINDINGS: Unchanged left chest wall AICD. Stable mild cardiomegaly. Normal pulmonary vascularity. Low lung volumes with bibasilar atelectasis. No focal consolidation, pleural effusion, or pneumothorax. No acute osseous abnormality. IMPRESSION: Low lung volumes with bibasilar atelectasis. No active cardiopulmonary disease. Electronically Signed   By: Titus Dubin M.D.   On: 05/13/2017 20:09        Scheduled Meds: . allopurinol  100 mg Oral Daily  . aspirin EC  81 mg Oral Daily  . cholecalciferol  1,000 Units Oral Daily  . famotidine  20 mg Oral QHS  . heparin  5,000 Units Subcutaneous Q8H  . montelukast  10 mg Oral QHS  . multivitamin with minerals  1 tablet Oral Daily  . omega-3 acid ethyl esters  1 g Oral Daily  . pravastatin  20 mg Oral q1800  . sertraline  50 mg Oral Daily   Continuous Infusions:   LOS: 0 days     Vernell Leep, MD,  FACP, FHM. [facility] Pager [phone]  If 7PM-7AM, please contact night-coverage [email or web address] Password [email or web address] 05/15/2017, 5:29 PM

## 2017-05-15 NOTE — Evaluation (Signed)
Physical Therapy Evaluation Patient Details Name: [name] MRN: [id number] DOB: 04/05/1927 Today's Date: 05/15/2017   History of Present Illness  [name] is a 84 y.o. female with medical history significant for chronic kidney disease stage III, hypertension, GERD, paroxysmal atrial fibrillation not anticoagulated, and history of symptomatic bradycardia with pacemaker, now presenting to the emergency department for evaluation of chest pain.   Clinical Impression  Pt with noted decreased SPO2 with ambulation on RA and noted wheezing upon return from ambulation of 120' with RW. Pt functioning at supervision level. See O2 states below. Pt was indep and didn't require AD for ambulation. Pt to recommend HHPT to improved strength, balance and activity tolerance especially if pt is going to need O2 for home.  SATURATION QUALIFICATIONS: (This note is used to comply with regulatory documentation for home oxygen)  Patient Saturations on Room Air at Rest = 93%%  Patient Saturations on Room Air while Ambulating = 82%  Patient Saturations on 2 Liters of oxygen while Ambulating = 88-89%  Please briefly explain why patient needs home oxygen: pt unable to maintain >88% on RA     Follow Up Recommendations Home health PT;Supervision/Assistance - 24 hour    Equipment Recommendations  (instructed pt to use RW, O2???)    Recommendations for Other Services       Precautions / Restrictions Precautions Precautions: Fall Precaution Comments: watch SpO2 Restrictions Weight Bearing Restrictions: No      Mobility  Bed Mobility Overal bed mobility: Needs Assistance Bed Mobility: Supine to Sit     Supine to sit: Supervision     General bed mobility comments: used bed rail, increased time but no physical assist needed  Transfers Overall transfer level: Needs assistance Equipment used: Rolling walker (2 wheeled) Transfers: Sit to/from Stand Sit to Stand: Supervision          General transfer comment: v/c's to push up from bed, not difficulties  Ambulation/Gait Ambulation/Gait assistance: Min guard Ambulation Distance (Feet): 120 Feet Assistive device: Rolling walker (2 wheeled) Gait Pattern/deviations: Step-through pattern Gait velocity: slow Gait velocity interpretation: Below normal speed for age/gender General Gait Details: pt SPO2 dec to 82% on RA, s/p standing rest break and 2LO2 pt SpO2 at 93% but then dropped to 88% during amb back to room. no epsidoes of LOB, but noted wheezing upon return to chair  Stairs            Wheelchair Mobility    Modified Rankin (Stroke Patients Only)       Balance Overall balance assessment: Mild deficits observed, not formally tested                                           Pertinent Vitals/Pain Pain Assessment: No/denies pain    Home Living Family/patient expects to be discharged to:: Private residence Living Arrangements: Children Available Help at Discharge: Family;Friend(s);Available 24 hours/day Type of Home: House Home Access: Stairs to enter Entrance Stairs-Rails: Right Entrance Stairs-Number of Steps: 2 Home Layout: One level Home Equipment: Walker - 2 wheels;Cane - single point;Shower seat      Prior Function Level of Independence: Independent         Comments: wasn't using AD, reports doing house work     Hand Dominance   Dominant Hand: Right    Extremity/Trunk Assessment   Upper Extremity Assessment Upper Extremity Assessment: Generalized weakness  Lower Extremity Assessment Lower Extremity Assessment: Generalized weakness    Cervical / Trunk Assessment Cervical / Trunk Assessment: Kyphotic  Communication   Communication: No difficulties  Cognition Arousal/Alertness: Awake/alert Behavior During Therapy: WFL for tasks assessed/performed Overall Cognitive Status: Within Functional Limits for tasks assessed                                  General Comments: pt extremely HOH      General Comments General comments (skin integrity, edema, etc.): pt was able to don mesh panties in sitting and then stand to pull them up without difficulty    Exercises     Assessment/Plan    PT Assessment Patient needs continued PT services  PT Problem List Decreased strength;Decreased activity tolerance;Decreased balance;Decreased mobility;Decreased coordination;Decreased knowledge of use of DME;Decreased safety awareness       PT Treatment Interventions DME instruction;Gait training;Stair training;Functional mobility training;Therapeutic activities;Therapeutic exercise;Balance training;Neuromuscular re-education    PT Goals (Current goals can be found in the Care Plan section)  Acute Rehab PT Goals Patient Stated Goal: home PT Goal Formulation: With patient Time For Goal Achievement: 05/29/17 Potential to Achieve Goals: Good    Frequency Min 3X/week   Barriers to discharge        Co-evaluation               AM-PAC PT "6 Clicks" Daily Activity  Outcome Measure Difficulty turning over in bed (including adjusting bedclothes, sheets and blankets)?: None Difficulty moving from lying on back to sitting on the side of the bed? : None Difficulty sitting down on and standing up from a chair with arms (e.g., wheelchair, bedside commode, etc,.)?: None Help needed moving to and from a bed to chair (including a wheelchair)?: A Little Help needed walking in hospital room?: A Little Help needed climbing 3-5 steps with a railing? : A Little 6 Click Score: 21    End of Session Equipment Utilized During Treatment: Gait belt;Oxygen(2LO2 via [place]) Activity Tolerance: Patient tolerated treatment well Patient left: in chair;with call bell/phone within reach Nurse Communication: Mobility status PT Visit Diagnosis: Unsteadiness on feet (R26.81)    Time: 2774-1287 PT Time Calculation (min) (ACUTE ONLY): 22 min   Charges:   PT  Evaluation $PT Eval Moderate Complexity: 1 Mod PT Treatments $Gait Training: 8-22 mins   PT G Codes:        Kittie Plater, PT, DPT Pager #: [phone] Office #: [phone]   [place] 05/15/2017, 1:57 PM

## 2017-05-15 NOTE — Care Management Note (Signed)
Case Management Note Marvetta Gibbons RN, BSN Unit 4E-Case Manager [phone]  Patient Details  Name: [name] MRN: [id number] Date of Birth: 01-12-1928  Subjective/Objective:  Pt presented with chest pain                  Action/Plan: PTA pt lived at home with family, has walker, cane and shower seat at home, Per PT eval- recommendations for home 02 and HHPT- orders have been placed for both [facility] and DME needs- spoke with pt and daughter at bedside- choice offered for [facility] agency- per pt and daughter they don't have preference as long as in network with insurance which is [employer]- offered in house provider- [facility]- which they are agreeable with. - Referral called to [facility] with [facility] for [facility] and DME needs- portable 02 tank to be delivered to room once approved prior to discharge (anticipate 05/15/17) HHPT referral accepted.   Expected Discharge Date:      05/15/17            Expected Discharge Plan:  [place]  In-House Referral:  NA  Discharge planning Services  CM Consult  Post Acute Care Choice:  Durable Medical Equipment, Home Health Choice offered to:  Patient, Adult Children  DME Arranged:  Oxygen DME Agency:  [place] Arranged:  PT Story Agency:  [place]  Status of Service:  Completed, signed off  If discussed at [place] of Stay Meetings, dates discussed:    Discharge Disposition: home/home health   Additional Comments:  Dawayne Patricia, RN 05/15/2017, 3:50 PM

## 2017-05-15 NOTE — Care Management Obs Status (Signed)
[place] NOTIFICATION   Patient Details  Name: ZAILA CREW MRN: [id number] Date of Birth: [birth year]   Medicare Observation Status Notification Given:  Yes    Dawayne Patricia, RN 05/15/2017, 3:43 PM

## 2017-05-16 ENCOUNTER — Ambulatory Visit: Payer: Medicare Other

## 2017-05-16 ENCOUNTER — Observation Stay (HOSPITAL_COMMUNITY): Payer: Medicare HMO

## 2017-05-16 DIAGNOSIS — D696 Thrombocytopenia, unspecified: Secondary | ICD-10-CM | POA: Diagnosis not present

## 2017-05-16 DIAGNOSIS — N183 Chronic kidney disease, stage 3 (moderate): Secondary | ICD-10-CM | POA: Diagnosis not present

## 2017-05-16 DIAGNOSIS — D649 Anemia, unspecified: Secondary | ICD-10-CM

## 2017-05-16 DIAGNOSIS — I5032 Chronic diastolic (congestive) heart failure: Secondary | ICD-10-CM | POA: Diagnosis not present

## 2017-05-16 DIAGNOSIS — I480 Paroxysmal atrial fibrillation: Secondary | ICD-10-CM

## 2017-05-16 DIAGNOSIS — R079 Chest pain, unspecified: Secondary | ICD-10-CM | POA: Diagnosis not present

## 2017-05-16 DIAGNOSIS — Z96651 Presence of right artificial knee joint: Secondary | ICD-10-CM | POA: Diagnosis not present

## 2017-05-16 DIAGNOSIS — R0789 Other chest pain: Secondary | ICD-10-CM | POA: Diagnosis not present

## 2017-05-16 MED ORDER — ACETAMINOPHEN 500 MG PO TABS: 1000.0000 mg | ORAL_TABLET | Freq: Three times a day (TID) | ORAL | Status: DC | PRN

## 2017-05-16 MED ORDER — TECHNETIUM TC 99M DIETHYLENETRIAME-PENTAACETIC ACID
32.1000 | Freq: Once | INTRAVENOUS | Status: AC | PRN
  Administered 2017-05-16: 32.1 via RESPIRATORY_TRACT

## 2017-05-16 MED ORDER — TECHNETIUM TO 99M ALBUMIN AGGREGATED
4.3400 | Freq: Once | INTRAVENOUS | Status: AC | PRN
  Administered 2017-05-16: 4.34 via INTRAVENOUS

## 2017-05-16 MED ORDER — TRAMADOL HCL 50 MG PO TABS: 50.0000 mg | ORAL_TABLET | Freq: Two times a day (BID) | ORAL | 0 refills | Status: DC | PRN

## 2017-05-16 NOTE — Discharge Summary (Signed)
Physician Discharge Summary  [name] HKV:[id number] DOB: 1928/01/31  PCP: [name], MD  Admit date: 05/13/2017 Discharge date: 05/16/2017  Recommendations for Outpatient Follow-up:  1. Dr. [name], PCP in 3 days.  Home Health: PT Equipment/Devices: Oxygen via nasal cannula at 3 L/min.  Discharge Condition: Improved and stable CODE STATUS: Full. Diet recommendation: Heart healthy diet.  Discharge Diagnoses:  Principal Problem:   Chest pain Active Problems:   Anemia, unspecified   CKD (chronic kidney disease), stage III (HCC)   Normochromic normocytic anemia   PAF (paroxysmal atrial fibrillation) (HCC)   Fever   Brief Summary: 84 year old female, her family (daughter, son-in-law and handicapped son) live with her, independent at home but at times uses a cane when she gets out of her house, PMH of chronic diastolic CHF (Dr. Meda Coffee, Cardiology), PAF not on anticoagulation, symptomatic bradycardia status post PPM (Dr. Lovena Le, EP Cardiology), stage III chronic kidney disease, HTN, HLD, GERD presented to the ED with complaints of atypical chest pain.  Admitted for observation and further evaluation.   Assessment & Plan:   Atypical Chest pain: Unclear etiology.  Suspect musculoskeletal etiology.  Patient reports some increased physical activity while caring for her disabled son who was recently brought to the hospital and had to have a stent placed, feels that she might have pulled something while attending to him when he was agitated (had to hold his hand down).  This was confirmed by family.  EKG and chest x-ray without acute findings.  POC troponin x2: Negative and troponin x 2: Negative.  Patient had low risk stress test 09/14/13.  TTE 03/08/17: LVEF 55-60%. Continue aspirin 81 mg daily, pravastatin 20 mg at bedtime.  Treat supportively with pain medicines.  Pain improved with Tylenol & tramadol.  Noted hypoxia on room air with activity by PT on 4/7.   Discussed in detail with patient's daughter who is a NP.  Advised that her chest pain clinically seems musculoskeletal in nature.  However given hypoxia, some concern regarding PE but low index of suspicion for PE or Aortic dissection.  Hypoxia may be related to atelectasis, poor inspiratory effort due to pain and possible COPD (apparently a physician has told her that she has COPD and another one said not and she was advised PFT which have not been done).  Not sure how mobile she was pta.  Discussed options of treating supportively with pain medicines, incentive spirometry, home with oxygen and outpatient follow-up with PCP versus checking d-dimer and if positive pursue VQ scan since unable to do CTA chest due to CKD and risk of worsening.  They opted to check d-dimer which turned out positive.  VQ scan : Low probability for PE.  Repeat EKG from 4/7 showed atrial paced rhythm, LAD, incomplete RBBB, nonspecific ST-T changes and no acute abnormalities.  QTc 390 ms.  Chest pain much improved.  Updated patient and daughter regarding VQ scan results.  Outpatient follow-up with PCP later this week.  Fever: Low-grade fever noted in ED without symptoms or signs to suggest source.  Chest x-ray negative.  Monitor off of antibiotics.  No recurrence of fevers.  Essential hypertension:  Continue ARB.  Negative orthostatics yesterday. Periodically follow BMP as outpatient.  Paroxysmal atrial fibrillation: Currently in sinus rhythm.  As per last cardiology office visit 04/25/17, anticoagulation was discussed with patient and she was not felt to be a great candidate for systemic anticoagulation but if she had more episodes then considering Eliquis.  Status post permanent  pacemaker: During last office visit with EP cardiology, PPM was working normally.  Stage III chronic kidney disease: Creatinine seems to be at baseline/1.3 range.  Normocytic anemia/thrombocytopenia, chronic: Stable.  GERD: Symptoms not  consistent with GERD.  Continue Pepcid.     Consultants:  None  Procedures:  As above.   Discharge Instructions  Discharge Instructions    Call MD for:  difficulty breathing, headache or visual disturbances   Complete by:  As directed    Call MD for:  extreme fatigue   Complete by:  As directed    Call MD for:  persistant dizziness or light-headedness   Complete by:  As directed    Call MD for:  severe uncontrolled pain   Complete by:  As directed    Diet - low sodium heart healthy   Complete by:  As directed    Discharge instructions   Complete by:  As directed    Oxygen via nasal cannula at 3 L/min continuously.   Increase activity slowly   Complete by:  As directed        Medication List    TAKE these medications   acetaminophen 500 MG tablet Commonly known as:  TYLENOL Take 2 tablets (1,000 mg total) by mouth every 8 (eight) hours as needed for mild pain or headache. What changed:    when to take this  reasons to take this   albuterol 108 (90 Base) MCG/ACT inhaler Commonly known as:  PROVENTIL HFA;VENTOLIN HFA Inhale 1 puff into the lungs every 6 (six) hours as needed for wheezing or shortness of breath.   alendronate 70 MG tablet Commonly known as:  FOSAMAX Take 70 mg by mouth once a week. On Friday   allopurinol 100 MG tablet Commonly known as:  ZYLOPRIM Take 1 tablet (100 mg total) by mouth daily.   aspirin EC 81 MG tablet Take 81 mg by mouth daily.   cholecalciferol 1000 units tablet Commonly known as:  VITAMIN D Take 1,000 Units by mouth daily.   colchicine 0.6 MG tablet Take 1 tablet by mouth every other day.   Ferrous Sulfate 28 MG Tabs Take 28 mg by mouth daily.   FISH OIL BURP-LESS 1200 MG Caps Take 1 capsule by mouth daily.   losartan 25 MG tablet Commonly known as:  COZAAR Take 1 tablet (25 mg total) by mouth daily.   montelukast 10 MG tablet Commonly known as:  SINGULAIR Take 10 mg by mouth at bedtime.   multivitamin  with minerals tablet Take 1 tablet by mouth daily.   pravastatin 20 MG tablet Commonly known as:  PRAVACHOL Take 20 mg by mouth at bedtime.   ranitidine 150 MG tablet Commonly known as:  ZANTAC Take 1 tablet by mouth at bedtime.   sertraline 50 MG tablet Commonly known as:  ZOLOFT Take 50 mg by mouth daily.   traMADol 50 MG tablet Commonly known as:  ULTRAM Take 1 tablet (50 mg total) by mouth every 12 (twelve) hours as needed for moderate pain.   vitamin E 400 UNIT capsule Take 400 Units by mouth daily.      [facility] Follow up.   Why:  home 02 arranged- portable tank to be delivered to room prior to discharge Contact information: [place] [address] [phone]        [facility] Follow up.   Specialty:  Home Health Services Why:  HHPT  arranged- they will call you at home to set up home visits  Contact information: [place] [address] [phone]        [name], MD. Schedule an appointment as soon as possible for a visit in 3 day(s).   Specialty:  Internal Medicine Contact information: [address] [place] [address] [phone]          Allergies  Allergen Reactions  . Penicillins Rash  . Amoxicillin Other (See Comments)    Very weak Has patient had a PCN reaction causing immediate rash, facial/tongue/throat swelling, SOB or lightheadedness with hypotension: no Has patient had a PCN reaction causing severe rash involving mucus membranes or skin necrosis: no Has patient had a PCN reaction that required hospitalization: no Has patient had a PCN reaction occurring within the last 10 years: no If all of the above answers are "NO", then may proceed with Cephalosporin use.   . Diltiazem Hcl Other (See Comments)    Reaction unknown  . Sulfonamide Derivatives Other (See Comments)    Reaction unknown  .  Zetia [Ezetimibe] Other (See Comments)    Weakness      Procedures/Studies: Dg Chest 2 View  Result Date: 05/13/2017 CLINICAL DATA:  Chest pain. EXAM: CHEST - 2 VIEW COMPARISON:  Chest x-ray dated February 23, 2017. FINDINGS: Unchanged left chest wall AICD. Stable mild cardiomegaly. Normal pulmonary vascularity. Low lung volumes with bibasilar atelectasis. No focal consolidation, pleural effusion, or pneumothorax. No acute osseous abnormality. IMPRESSION: Low lung volumes with bibasilar atelectasis. No active cardiopulmonary disease. Electronically Signed   By: Titus Dubin M.D.   On: 05/13/2017 20:09   Nm Pulmonary Perf And Vent  Result Date: 05/16/2017 CLINICAL DATA:  Atypical chest pain, history chronic kidney disease, CHF, asthma, former smoker EXAM: NUCLEAR MEDICINE VENTILATION - PERFUSION LUNG SCAN TECHNIQUE: Ventilation images were obtained in multiple projections using inhaled aerosol Tc-31m DTPA. Perfusion images were obtained in multiple projections after intravenous injection of Tc-76m-MAA. RADIOPHARMACEUTICALS:  32.1 mCi of Tc-67m DTPA aerosol inhalation and 4.34 mCi Tc6m-MAA IV COMPARISON:  None Correlation: Chest radiograph 05/13/2017 FINDINGS: Ventilation: Mild central airway deposition of aerosol. Enlargement of cardiac silhouette. Diminished ventilation posterior RIGHT upper lobe and in posterior LEFT lower lobe. No other focal ventilatory defects. Perfusion: Matching diminished perfusion in posterior RIGHT upper lobe. Less significantly diminished perfusion in LEFT lower lobe. No other perfusion defects. Chest radiograph: Enlargement of cardiac silhouette post AICD with bibasilar atelectasis IMPRESSION: Low probability for pulmonary embolism. Electronically Signed   By: Lavonia Dana M.D.   On: 05/16/2017 16:06      Subjective: Patient interviewed and examined with her son at bedside.  Reports improvement of chest pain which she rates as intermittent, 4/10, worse with chest wall  movements side to side and on deep inspiration.  Denies dyspnea.  Passing flatus.  Last BM 2 days ago.  No abdominal pain, nausea or vomiting.  Tolerating diet well.  As per RN, hypoxic in the low to mid 80s with activity.  No other acute issues reported.  Discharge Exam:  Vitals:   05/16/17 0731 05/16/17 0732 05/16/17 1256 05/16/17 1621  BP: (!) 105/55 (!) 111/53 (!) 126/48 (!) 120/54  Pulse: 61 (!) 59 60   Resp: 12 10    Temp: 98.2 F (36.8 C)     TempSrc: Oral     SpO2: 97% 98%    Weight:      Height:  General exam: Pleasant elderly female, moderately built and nourished, lying comfortably supine in bed. Respiratory system: Clear to auscultation. Respiratory effort normal.  Partially reproducible lower midsternal tenderness-better today.  Cardiovascular system: S1 & S2 heard, RRR. No JVD, murmurs, rubs, gallops or clicks. No pedal edema.    Telemetry personally reviewed: On demand atrial pacing but otherwise sinus rhythm.  13 beat nonsustained SVT noted. Gastrointestinal system: Abdomen is nondistended, soft and nontender. No organomegaly or masses felt. Normal bowel sounds heard.   Central nervous system: Alert and oriented. No focal neurological deficits. Extremities: Symmetric 5 x 5 power. Skin: No rashes, lesions or ulcers Psychiatry: Judgement and insight appear normal. Mood & affect flat.       The results of significant diagnostics from this hospitalization (including imaging, microbiology, ancillary and laboratory) are listed below for reference.      Labs: CBC: Recent Labs  Lab 05/13/17 1935  WBC 7.8  HGB 10.3*  HCT 31.8*  MCV 95.8  PLT 599*   Basic Metabolic Panel: Recent Labs  Lab 05/13/17 1935 05/14/17 0435  NA 138 138  K 4.0 3.8  CL 105 104  CO2 21* 21*  GLUCOSE 124* 134*  BUN 33* 32*  CREATININE 1.34* 1.36*  CALCIUM 9.4 9.1   Liver Function Tests: Recent Labs  Lab 05/14/17 0435  AST 22  ALT 17  ALKPHOS 75  BILITOT 1.1  PROT  5.9*  ALBUMIN 3.6    Cardiac Enzymes: Recent Labs  Lab 05/14/17 0426 05/14/17 1051  TROPONINI <0.03 <0.03   Urinalysis    Component Value Date/Time   COLORURINE YELLOW 05/14/2017 2053   APPEARANCEUR CLEAR 05/14/2017 2053   LABSPEC 1.010 05/14/2017 2053   PHURINE 5.0 05/14/2017 2053   GLUCOSEU NEGATIVE 05/14/2017 2053   HGBUR NEGATIVE 05/14/2017 2053   [place] NEGATIVE 05/14/2017 2053   [place] NEGATIVE 05/14/2017 2053   PROTEINUR NEGATIVE 05/14/2017 2053   UROBILINOGEN 0.2 07/20/2013 1457   NITRITE NEGATIVE 05/14/2017 2053   LEUKOCYTESUR NEGATIVE 05/14/2017 2053   I discussed in detail with patient's son at bedside this morning prior to VQ scan.  I just discussed with patient's daughter who is a Designer, jewellery via phone.  Updated care and answered all questions.   Time coordinating discharge: Over 30 minutes  SIGNED:  Vernell Leep, MD, FACP, [facility]. [facility] Pager [phone]  If 7PM-7AM, please contact night-coverage [email or web address] Password [facility] 05/16/2017, 5:24 PM

## 2017-05-16 NOTE — Discharge Instructions (Signed)
Please get your medications reviewed and adjusted by your Primary MD. ° °Please request your Primary MD to go over all Hospital Tests and Procedure/Radiological results at the follow up, please get all Hospital records sent to your Prim MD by signing hospital release before you go home. ° °If you had Pneumonia of Lung problems at the Hospital: °Please get a 2 view Chest X ray done in 6-8 weeks after hospital discharge or sooner if instructed by your Primary MD. ° °If you have Congestive Heart Failure: °Please call your Cardiologist or Primary MD anytime you have any of the following symptoms:  °1) 3 pound weight gain in 24 hours or 5 pounds in 1 week  °2) shortness of breath, with or without a dry hacking cough  °3) swelling in the hands, feet or stomach  °4) if you have to sleep on extra pillows at night in order to breathe ° °Follow cardiac low salt diet and 1.5 lit/day fluid restriction. ° °If you have diabetes °Accuchecks 4 times/day, Once in AM empty stomach and then before each meal. °Log in all results and show them to your primary doctor at your next visit. °If any glucose reading is under 80 or above 300 call your primary MD immediately. ° °If you have Seizure/Convulsions/Epilepsy: °Please do not drive, operate heavy machinery, participate in activities at heights or participate in high speed sports until you have seen by Primary MD or a Neurologist and advised to do so again. ° °If you had Gastrointestinal Bleeding: °Please ask your Primary MD to check a complete blood count within one week of discharge or at your next visit. Your endoscopic/colonoscopic biopsies that are pending at the time of discharge, will also need to followed by your Primary MD. ° °Get Medicines reviewed and adjusted. °Please take all your medications with you for your next visit with your Primary MD ° °Please request your Primary MD to go over all hospital tests and procedure/radiological results at the follow up, please ask your  Primary MD to get all Hospital records sent to his/her office. ° °If you experience worsening of your admission symptoms, develop shortness of breath, life threatening emergency, suicidal or homicidal thoughts you must seek medical attention immediately by calling 911 or calling your MD immediately  if symptoms less severe. ° °You must read complete instructions/literature along with all the possible adverse reactions/side effects for all the Medicines you take and that have been prescribed to you. Take any new Medicines after you have completely understood and accpet all the possible adverse reactions/side effects.  ° °Do not drive or operate heavy machinery when taking Pain medications.  ° °Do not take more than prescribed Pain, Sleep and Anxiety Medications ° °Special Instructions: If you have smoked or chewed Tobacco  in the last 2 yrs please stop smoking, stop any regular Alcohol  and or any Recreational drug use. ° °Wear Seat belts while driving. ° °Please note °You were cared for by a hospitalist during your hospital stay. If you have any questions about your discharge medications or the care you received while you were in the hospital after you are discharged, you can call the unit and asked to speak with the hospitalist on call if the hospitalist that took care of you is not available. Once you are discharged, your primary care physician will handle any further medical issues. Please note that NO REFILLS for any discharge medications will be authorized once you are discharged, as it is imperative that you   return to your primary care physician (or establish a relationship with a primary care physician if you do not have one) for your aftercare needs so that they can reassess your need for medications and monitor your lab values.  You can reach the hospitalist office at phone [phone] or fax [phone]   If you do not have a primary care physician, you can call [phone] for a physician  referral.   Nonspecific Chest Pain Chest pain can be caused by many different conditions. There is always a chance that your pain could be related to something serious, such as a heart attack or a blood clot in your lungs. Chest pain can also be caused by conditions that are not life-threatening. If you have chest pain, it is very important to follow up with your health care provider. What are the causes? Causes of this condition include:  Heartburn.  Pneumonia or bronchitis.  Anxiety or stress.  Inflammation around your heart (pericarditis) or lung (pleuritis or pleurisy).  A blood clot in your lung.  A collapsed lung (pneumothorax). This can develop suddenly on its own (spontaneous pneumothorax) or from trauma to the chest.  Shingles infection (varicella-zoster virus).  Heart attack.  Damage to the bones, muscles, and cartilage that make up your chest wall. This can include: ? Bruised bones due to injury. ? Strained muscles or cartilage due to frequent or repeated coughing or overwork. ? Fracture to one or more ribs. ? Sore cartilage due to inflammation (costochondritis).  What increases the risk? Risk factors for this condition may include:  Activities that increase your risk for trauma or injury to your chest.  Respiratory infections or conditions that cause frequent coughing.  Medical conditions or overeating that can cause heartburn.  Heart disease or family history of heart disease.  Conditions or health behaviors that increase your risk of developing a blood clot.  Having had chicken pox (varicella zoster).  What are the signs or symptoms? Chest pain can feel like:  Burning or tingling on the surface of your chest or deep in your chest.  Crushing, pressure, aching, or squeezing pain.  Dull or sharp pain that is worse when you move, cough, or take a deep breath.  Pain that is also felt in your back, neck, shoulder, or arm, or pain that spreads to any of  these areas.  Your chest pain may come and go, or it may stay constant. How is this diagnosed? Lab tests or other studies may be needed to find the cause of your pain. Your health care provider may have you take a test called an ECG (electrocardiogram). An ECG records your heartbeat patterns at the time the test is performed. You may also have other tests, such as:  Transthoracic echocardiogram (TTE). In this test, sound waves are used to create a picture of the heart structures and to look at how blood flows through your heart.  Transesophageal echocardiogram (TEE).This is a more advanced imaging test that takes images from inside your body. It allows your health care provider to see your heart in finer detail.  Cardiac monitoring. This allows your health care provider to monitor your heart rate and rhythm in real time.  Holter monitor. This is a portable device that records your heartbeat and can help to diagnose abnormal heartbeats. It allows your health care provider to track your heart activity for several days, if needed.  Stress tests. These can be done through exercise or by taking medicine that makes your heart  beat more quickly.  Blood tests.  Other imaging tests.  How is this treated? Treatment depends on what is causing your chest pain. Treatment may include:  Medicines. These may include: ? Acid blockers for heartburn. ? Anti-inflammatory medicine. ? Pain medicine for inflammatory conditions. ? Antibiotic medicine, if an infection is present. ? Medicines to dissolve blood clots. ? Medicines to treat coronary artery disease (CAD).  Supportive care for conditions that do not require medicines. This may include: ? Resting. ? Applying heat or cold packs to injured areas. ? Limiting activities until pain decreases.  Follow these instructions at home: Medicines  If you were prescribed an antibiotic, take it as told by your health care provider. Do not stop taking the  antibiotic even if you start to feel better.  Take over-the-counter and prescription medicines only as told by your health care provider. Lifestyle  Do not use any products that contain nicotine or tobacco, such as cigarettes and e-cigarettes. If you need help quitting, ask your health care provider.  Do not drink alcohol.  Make lifestyle changes as directed by your health care provider. These may include: ? Getting regular exercise. Ask your health care provider to suggest some activities that are safe for you. ? Eating a heart-healthy diet. A registered dietitian can help you to learn healthy eating options. ? Maintaining a healthy weight. ? Managing diabetes, if necessary. ? Reducing stress, such as with yoga or relaxation techniques. General instructions  Avoid any activities that bring on chest pain.  If heartburn is the cause for your chest pain, raise (elevate) the head of your bed about 6 inches (15 cm) by putting blocks under the legs. Sleeping with more pillows does not effectively relieve heartburn because it only changes the position of your head.  Keep all follow-up visits as told by your health care provider. This is important. This includes any further testing if your chest pain does not go away. Contact a health care provider if:  Your chest pain does not go away.  You have a rash with blisters on your chest.  You have a fever.  You have chills. Get help right away if:  Your chest pain is worse.  You have a cough that gets worse, or you cough up blood.  You have severe pain in your abdomen.  You have severe weakness.  You faint.  You have sudden, unexplained chest discomfort.  You have sudden, unexplained discomfort in your arms, back, neck, or jaw.  You have shortness of breath at any time.  You suddenly start to sweat, or your skin gets clammy.  You feel nauseous or you vomit.  You suddenly feel light-headed or dizzy.  Your heart begins to beat  quickly, or it feels like it is skipping beats. These symptoms may represent a serious problem that is an emergency. Do not wait to see if the symptoms will go away. Get medical help right away. Call your local emergency services (911 in the [place] Do not drive yourself to the hospital. This information is not intended to replace advice given to you by your health care provider. Make sure you discuss any questions you have with your health care provider. Document Released: 11/04/2004 Document Revised: 10/20/2015 Document Reviewed: 10/20/2015 Elsevier Interactive Patient Education  2017 [employer].

## 2017-05-16 NOTE — Progress Notes (Signed)
PT Cancellation Note  Patient Details Name: [name] MRN: [id number] DOB: 1927/07/18   Cancelled Treatment:    Reason Eval/Treat Not Completed: Patient at procedure or test/unavailable. RN advised pt is at VQ testing. Will check back as time allows.   Benjiman Core, PTA Pager [phone] Acute Rehab   Allena Katz 05/16/2017, 3:26 PM

## 2017-05-16 NOTE — Care Management (Signed)
ED CM received call from RN on 4E concerning adding an RN at patient's and family request to [facility] services. Order was obtained and faxed to [facility] via [place]. No further CM needs identified.

## 2017-05-17 ENCOUNTER — Ambulatory Visit
Admission: RE | Admit: 2017-05-17 | Discharge: 2017-05-17 | Disposition: A | Discharge status: Home / Self Care | Payer: Medicare HMO | Source: Ambulatory Visit | Attending: Internal Medicine | Admitting: Internal Medicine

## 2017-05-17 ENCOUNTER — Other Ambulatory Visit: Payer: Self-pay | Admitting: Internal Medicine

## 2017-05-17 DIAGNOSIS — J411 Mucopurulent chronic bronchitis: Secondary | ICD-10-CM | POA: Diagnosis not present

## 2017-05-17 DIAGNOSIS — R600 Localized edema: Secondary | ICD-10-CM | POA: Diagnosis not present

## 2017-05-17 DIAGNOSIS — R7989 Other specified abnormal findings of blood chemistry: Secondary | ICD-10-CM

## 2017-05-17 DIAGNOSIS — R42 Dizziness and giddiness: Secondary | ICD-10-CM | POA: Diagnosis not present

## 2017-05-17 DIAGNOSIS — R0781 Pleurodynia: Secondary | ICD-10-CM | POA: Diagnosis not present

## 2017-05-17 DIAGNOSIS — R0902 Hypoxemia: Secondary | ICD-10-CM | POA: Diagnosis not present

## 2017-05-17 LAB — URINE CULTURE

## 2017-05-18 ENCOUNTER — Ambulatory Visit: Payer: Medicare HMO | Admitting: Physician Assistant

## 2017-05-18 ENCOUNTER — Ambulatory Visit
Admission: RE | Admit: 2017-05-18 | Discharge: 2017-05-18 | Disposition: A | Discharge status: Home / Self Care | Payer: Medicare HMO | Source: Ambulatory Visit | Attending: Physician Assistant | Admitting: Physician Assistant

## 2017-05-18 ENCOUNTER — Encounter: Payer: Self-pay | Admitting: Physician Assistant

## 2017-05-18 VITALS — BP 140/58 | HR 70 | Ht 63.0 in | Wt 166.0 lb

## 2017-05-18 DIAGNOSIS — Z950 Presence of cardiac pacemaker: Secondary | ICD-10-CM | POA: Diagnosis not present

## 2017-05-18 DIAGNOSIS — I130 Hypertensive heart and chronic kidney disease with heart failure and stage 1 through stage 4 chronic kidney disease, or unspecified chronic kidney disease: Secondary | ICD-10-CM | POA: Diagnosis not present

## 2017-05-18 DIAGNOSIS — R0602 Shortness of breath: Secondary | ICD-10-CM | POA: Diagnosis not present

## 2017-05-18 DIAGNOSIS — R0902 Hypoxemia: Secondary | ICD-10-CM | POA: Diagnosis not present

## 2017-05-18 DIAGNOSIS — I4510 Unspecified right bundle-branch block: Secondary | ICD-10-CM | POA: Diagnosis not present

## 2017-05-18 DIAGNOSIS — E7800 Pure hypercholesterolemia, unspecified: Secondary | ICD-10-CM | POA: Diagnosis not present

## 2017-05-18 DIAGNOSIS — R079 Chest pain, unspecified: Secondary | ICD-10-CM

## 2017-05-18 DIAGNOSIS — N183 Chronic kidney disease, stage 3 unspecified: Secondary | ICD-10-CM

## 2017-05-18 DIAGNOSIS — I5032 Chronic diastolic (congestive) heart failure: Secondary | ICD-10-CM | POA: Diagnosis not present

## 2017-05-18 DIAGNOSIS — D631 Anemia in chronic kidney disease: Secondary | ICD-10-CM | POA: Diagnosis not present

## 2017-05-18 DIAGNOSIS — I480 Paroxysmal atrial fibrillation: Secondary | ICD-10-CM

## 2017-05-18 DIAGNOSIS — R05 Cough: Secondary | ICD-10-CM | POA: Diagnosis not present

## 2017-05-18 DIAGNOSIS — J45909 Unspecified asthma, uncomplicated: Secondary | ICD-10-CM | POA: Diagnosis not present

## 2017-05-18 DIAGNOSIS — K219 Gastro-esophageal reflux disease without esophagitis: Secondary | ICD-10-CM | POA: Diagnosis not present

## 2017-05-18 MED ORDER — DOXYCYCLINE HYCLATE 100 MG PO CAPS: 100.0000 mg | ORAL_CAPSULE | Freq: Two times a day (BID) | ORAL | 0 refills | Status: DC

## 2017-05-18 NOTE — Patient Instructions (Signed)
Medication Instructions:  1. START DOXYCYCLINE 100 MG TAKE 1 TABLET TWICE DAILY FOR 7 DAYS THEN STOP   Labwork: 1. TODAY BMET, PRO BNP  Testing/Procedures: 1. Your physician has requested that you have an echocardiogram. Echocardiography is a painless test that uses sound waves to create images of your heart. It provides your doctor with information about the size and shape of your heart and how well your heart's chambers and valves are working. This procedure takes approximately one hour. There are no restrictions for this procedure.  2. A chest x-ray takes a picture of the organs and structures inside the chest, including the heart, lungs, and blood vessels. This test can show several things, including, whether the heart is enlarges; whether fluid is building up in the lungs; and whether pacemaker / defibrillator leads are still in place. THIS IS TO BE DONE TODAY AT [facility]   Follow-Up: KEEP YOUR APPT WITH DR/ NELSON 06/13/17   Any Other Special Instructions Will Be Listed Below (If Applicable).     If you need a refill on your cardiac medications before your next appointment, please call your pharmacy.

## 2017-05-18 NOTE — Progress Notes (Signed)
Cardiology Office Note:    Date:  05/18/2017   ID:  [name], DOB 1927-10-18, MRN [id number]  PCP:  [name], MD  Cardiologist:  Ena Dawley, MD  Electrophysiologist: Dr. Cristopher Peru   Referring MD: [name],*   Chief Complaint  Patient presents with  . Chest Pain  . Shortness of Breath    History of Present Illness:    [name] is a 84 y.o. female with paroxysmal atrial fibrillation, diastolic heart failure, sinus node dysfunction s/p pacemaker, mild pulmonary HTN, hyperlipidemia, GERD, CKD.  She also has a hx of palpitations and PVCs as well as dizziness.  She saw Lyda Jester, PA-C in 02/2017.  She had TW inversions on her ECG and a follow up echocardiogram demonstrated normal LV function.  Her pacemaker has demonstrated episodes of atrial fibrillation but the patient is not felt to be an ideal candidate for anticoagulation unless there is more significant atrial fibrillation noted.  Last seen by Dr. Lovena Le in March 2019.  She was admitted 4/5-4/8 with chest pain.  Cardiac enzymes were negative.  Patient did develop hypoxia with ambulation.  D-dimer was elevated.  VQ scan was low probability for pulmonary embolism.  [name] returns for further evaluation of chest pain, shortness of breath.  She is here with her daughter's friend Nurse, learning disability - prior RN with cardiac surgery).  She started to develop chest discomfort while her son (who has special needs) was in the hospital undergoing PCI.  She was sitting in the same position for several hours.  Prior to this, she was able to walk down the hall with a cane and no issues.  After her chest discomfort started she has had difficulty with shortness of breath.  Her pain was constant for hours prior to being admitted.  She denies associated nausea or diaphoresis or jaw pain.  She does have worsening pain with lying supine as well as pleuritic symptoms.  She has also had a cough with yellowish  sputum.  She had a fever at one point during her hospitalization.  She is now on oxygen.  She sees pulmonology in 2 days.  Her pain is better than it was when she went to the hospital.  She still has intermittent pain that seems to occur with positional changes.  Prior CV studies:   The following studies were reviewed today:  VQ scan 05/16/17 IMPRESSION: Low probability for pulmonary embolism.  Venous duplex 05/17/17 IMPRESSION: No significant femoropopliteal DVT in either extremity. Limited assessment of the calf veins.  Echo 03/08/17 Moderate LVH, EF 75-17, grade 1 diastolic dysfunction, MAC, severe LAE  Event monitor 04/07/16 Atrial fibrillation with slow ventricular response, but no significant pauses. She is on no AV nodal blocking agent.  Carotid US 09/27/14 IMPRESSION: 1. Mild bilateral carotid bifurcation and proximal ICA plaque, resulting in less than 50% diameter stenosis. The exam does not exclude plaque ulceration or embolization. Continued surveillance Recommended.  48-hour Holter 8/16  Sinus bradycardia to sinus tachycardia, profound sinus arrhythmia and increased vagal tone with episodes of sinus bradycardia dwon to 43 BPM but no pause longer then 2 seconds.  Very short episodes of atrial tachycardia.  Nuclear stress test 09/14/13 Low risk stress nuclear study with apical attenuation artifact, worse at rest than stress. No reversible ischemia.  LV Ejection Fraction: 69%.  LV Wall Motion:  Normal Wall Motion  Echo 08/24/13 Mild LVH, EF 00-17, grade 1 diastolic dysfunction, mild MR, severe LAE  Echo 03/12/13 EF 55-60, PASP  60  Past Medical History:  Diagnosis Date  . Anemia    "I stay anemic"  . Arthritis   . Asthma    no problems recent  . Cataract   . Chronic diastolic CHF (congestive heart failure), NYHA class 2 ([place]) 03/19/2014  . CKD (chronic kidney disease), stage III ([place])   . Depression   . Diverticulitis   . DVT (deep venous thrombosis) ([place])    from  birth control, left groin  . Essential hypertension   . Fibromyalgia 10 y.a.  . GERD (gastroesophageal reflux disease)   . Gout   . Hx of cardiovascular stress test    Lexiscan Myoview (8/15): apical attenuation artifact, no ischemia, EF 69% - low risk.  Marland Kitchen Hypercholesteremia   . Odynophagia   . Other abnormal glucose   . PAF (paroxysmal atrial fibrillation) ([place])   . Personal history of other diseases of digestive system   . Pneumonia    hx of  . Sinus bradycardia   . Skin cancer 2014   skin R elbow & face  . Symptomatic bradycardia 06/2016   Surgical Hx: The patient  has a past surgical history that includes Laryngoscopy / bronchoscopy / esophagoscopy (2010); Laminectomy (05/2007); Cholecystectomy (10/2005); Colonoscopy w/ biopsies (2005, 2011); Skin cancer excision (Right, ~2005); Tubal ligation; Appendectomy (~55years ago); Abdominal hysterectomy; Tumor removal (Right, ~ 20 years ago); Knee arthroscopy (Right); Total knee arthroplasty (Left, 04/04/2013); and PACEMAKER IMPLANT (N/A, 07/01/2016).   Current Medications: Current Meds  Medication Sig  . acetaminophen (TYLENOL) 500 MG tablet Take 2 tablets (1,000 mg total) by mouth every 8 (eight) hours as needed for mild pain or headache.  . albuterol (PROVENTIL HFA;VENTOLIN HFA) 108 (90 BASE) MCG/ACT inhaler Inhale 1 puff into the lungs every 6 (six) hours as needed for wheezing or shortness of breath.  Marland Kitchen alendronate (FOSAMAX) 70 MG tablet Take 70 mg by mouth once a week. On Friday  . allopurinol (ZYLOPRIM) 100 MG tablet Take 1 tablet (100 mg total) by mouth daily.  Marland Kitchen aspirin EC 81 MG tablet Take 81 mg by mouth daily.   . cholecalciferol (VITAMIN D) 1000 units tablet Take 1,000 Units by mouth daily.   . colchicine 0.6 MG tablet Take 1 tablet by mouth every other day.  . Ferrous Sulfate 28 MG TABS Take 28 mg by mouth daily.   Marland Kitchen losartan (COZAAR) 25 MG tablet Take 1 tablet (25 mg total) by mouth daily.  . montelukast (SINGULAIR) 10 MG  tablet Take 10 mg by mouth at bedtime.    . Multiple Vitamins-Minerals (MULTIVITAMIN WITH MINERALS) tablet Take 1 tablet by mouth daily.  . Omega-3 Fatty Acids (FISH OIL BURP-LESS) 1200 MG CAPS Take 1 capsule by mouth daily.   . pravastatin (PRAVACHOL) 20 MG tablet Take 20 mg by mouth at bedtime.   . ranitidine (ZANTAC) 150 MG tablet Take 1 tablet by mouth at bedtime.  . sertraline (ZOLOFT) 50 MG tablet Take 50 mg by mouth daily.   . traMADol (ULTRAM) 50 MG tablet Take 1 tablet (50 mg total) by mouth every 12 (twelve) hours as needed for moderate pain.  . vitamin E 400 UNIT capsule Take 400 Units by mouth daily.      Allergies:   Penicillins; Amoxicillin; Diltiazem hcl; Sulfonamide derivatives; and Zetia [ezetimibe]   Social History   Tobacco Use  . Smoking status: Former Smoker    Years: 10.00    Types: Cigarettes    Last attempt to quit: 02/08/1973  Years since quitting: 44.3  . Smokeless tobacco: Never Used  Substance Use Topics  . Alcohol use: No  . Drug use: No     Family Hx: The patient's family history includes Arrhythmia in her mother; Asthma in her sister; Cancer in her brother and sister; Diabetes in her father and sister; Fainting in her mother; Heart attack in her brother; Heart disease in her mother; Hypertension in her father and mother; Stroke in her father; Sudden death in her brother.  ROS:   Please see the history of present illness.    Review of Systems  Constitution: Positive for decreased appetite, fever and malaise/fatigue.  HENT: Positive for hearing loss.   Eyes: Positive for visual disturbance.  Cardiovascular: Positive for chest pain and dyspnea on exertion.  Hematologic/Lymphatic: Bruises/bleeds easily.  Musculoskeletal: Positive for back pain and joint pain.  Gastrointestinal: Positive for abdominal pain.  Neurological: Positive for dizziness and loss of balance.  Psychiatric/Behavioral: Positive for depression.   All other systems reviewed and are  negative.   EKGs/Labs/Other Test Reviewed:    EKG:  EKG is  ordered today.  The ekg ordered today demonstrates atrial paced, HR 60, left axis deviation, T wave inversions V3-V6, similar to prior tracings  Recent Labs: 01/16/2017: B Natriuretic Peptide 334.6 05/13/2017: Hemoglobin 10.3; Platelets 120 05/14/2017: ALT 17; BUN 32; Creatinine, Ser 1.36; Potassium 3.8; Sodium 138   Recent Lipid Panel Lab Results  Component Value Date/Time   CHOL  06/07/2008 04:05 AM    118        ATP III CLASSIFICATION:  <200     mg/dL   Desirable  200-239  mg/dL   Borderline High  >=240    mg/dL   High          TRIG 85 06/07/2008 04:05 AM   HDL 35 (L) 06/07/2008 04:05 AM   CHOLHDL 3.4 06/07/2008 04:05 AM   LDLCALC  06/07/2008 04:05 AM    66        Total Cholesterol/HDL:CHD Risk Coronary Heart Disease Risk Table                     Men   Women  1/2 Average Risk   3.4   3.3  Average Risk       5.0   4.4  2 X Average Risk   9.6   7.1  3 X Average Risk  23.4   11.0        Use the calculated Patient Ratio above and the CHD Risk Table to determine the patient's CHD Risk.        ATP III CLASSIFICATION (LDL):  <100     mg/dL   Optimal  100-129  mg/dL   Near or Above                    Optimal  130-159  mg/dL   Borderline  160-189  mg/dL   High  >190     mg/dL   Very High    Physical Exam:    VS:  BP (!) 140/58   Pulse 70   Ht 5' 3"  (1.6 m)   Wt 166 lb (75.3 kg)   SpO2 98%   BMI 29.41 kg/m     Wt Readings from Last 3 Encounters:  05/18/17 166 lb (75.3 kg)  05/13/17 163 lb (73.9 kg)  04/25/17 165 lb (74.8 kg)     Physical Exam  Constitutional: She is oriented to person, place,  and time. She appears well-developed and well-nourished. No distress.  HENT:  Head: Normocephalic and atraumatic.  Neck: No JVD present.  Cardiovascular: Normal rate and regular rhythm. Exam reveals no gallop.  No murmur heard. Pulmonary/Chest: Effort normal. She has no rales.  Abdominal: Soft.    Musculoskeletal: She exhibits no edema.  Neurological: She is alert and oriented to person, place, and time.  Skin: Skin is warm and dry.    ASSESSMENT & PLAN:    #1.  Shortness of breath  As noted, she was recently admitted for prolonged chest pain as well as shortness of breath with associated hypoxia.  D-dimer was minimally elevated and VQ scan was low probability for pulmonary embolism.  Her daughter's friend, who is with her today, questions if we should not just go ahead and put her on anticoagulation because her d-dimer was elevated.  At this point, I do not think that this would be indicated unless she has some other imaging study that confirms pulmonary embolism.  She had an echo in January that demonstrated normal EF and mild diastolic dysfunction.  A repeat echo would help rule out new wall motion abnormalities, new LV dysfunction as well as assess pulmonary pressures and right heart function.  If she has elevated pulmonary artery pressures and decreased RV function, we could consider trying to pursue chest CTA.  She does have a cough with yellowish sputum as well as pleuritic chest discomfort.  I question whether or not she has a pulmonary process (i.e. pneumonia) contributing to her symptomatology.  She does see pulmonology in 2 days.  However, I think she would benefit from a repeat chest x-ray as well as starting on antibiotic therapy.  She does not appear to be volume overloaded.  However, I will obtain a BNP today as well.  If the BNP is significantly elevated, I will place her on low-dose diuretic therapy.  -Obtain chest x-ray  -Start doxycycline 100 mg twice daily times 7 days  -BMET, BNP  -Arrange echocardiogram  -Follow-up with pulmonology in 2 days as planned  -Keep follow-up with Dr. Meda Coffee 06/13/17  #2.  Chest pain, unspecified type She had chest pain that went on for hours without relief.  Her cardiac enzymes remained negative.  Her symptoms of chest discomfort seem to be  more related to positional changes.  She has anterior T wave inversions that have been noted previously without change.  She would be high risk for contrast-induced nephropathy with cardiac catheterization.  At this point, I am not convinced that stress testing would be helpful.  I believe treating possible infection and obtaining an echocardiogram is the best initial testing.  If she continues to have symptoms, we could certainly consider proceeding with stress testing.  #3.  Chronic diastolic CHF (congestive heart failure) ([place]) She has a history of this but was taken off of diuretic therapy some years ago.  She does not appear to be volume overloaded on exam.  As noted, a BNP will be obtained.  If this is significantly elevated, I will place her on Lasix.  Echocardiogram is pending.  #4.  PAF (paroxysmal atrial fibrillation) ([place]) This has been noted on interrogation of her pacemaker.  She has not been felt to be an ideal candidate for anticoagulation.  Continue follow-up with Dr. Lovena Le as planned.  #5.  CKD (chronic kidney disease), stage III ([place]) Repeat BMET today.  If Lasix started, I will need to keep a close eye on her renal function potassium.  #  6.  Cardiac pacemaker in situ Continue follow-up with EP as planned.   Dispo:  Return in about 4 weeks (around 06/13/2017) for Scheduled Follow Up w/ Dr. Meda Coffee.   Medication Adjustments/Labs and Tests Ordered: Current medicines are reviewed at length with the patient today.  Concerns regarding medicines are outlined above.  Tests Ordered: Orders Placed This Encounter  Procedures  . DG Chest 2 View  . Basic Metabolic Panel (BMET)  . Pro b natriuretic peptide  . EKG 12-Lead  . ECHOCARDIOGRAM COMPLETE   Medication Changes: Meds ordered this encounter  Medications  . doxycycline (VIBRAMYCIN) 100 MG capsule    Sig: Take 1 capsule (100 mg total) by mouth 2 (two) times daily.    Dispense:  14 capsule    Refill:  0    Signed, Richardson Dopp, PA-C  05/18/2017 1:29 PM    [facility] [place], [place], [place]  [address] Phone: [phone]; Fax: [phone]

## 2017-05-19 ENCOUNTER — Other Ambulatory Visit: Payer: Self-pay | Admitting: *Deleted

## 2017-05-19 DIAGNOSIS — N183 Chronic kidney disease, stage 3 unspecified: Secondary | ICD-10-CM

## 2017-05-19 DIAGNOSIS — I5032 Chronic diastolic (congestive) heart failure: Secondary | ICD-10-CM

## 2017-05-19 DIAGNOSIS — R0602 Shortness of breath: Secondary | ICD-10-CM

## 2017-05-19 LAB — BASIC METABOLIC PANEL
BUN/Creatinine Ratio: 27 (ref 12–28)
BUN: 32 mg/dL — AB (ref 8–27)
CALCIUM: 9.2 mg/dL (ref 8.7–10.3)
CHLORIDE: 101 mmol/L (ref 96–106)
CO2: 24 mmol/L (ref 20–29)
Creatinine, Ser: 1.18 mg/dL — ABNORMAL HIGH (ref 0.57–1.00)
GFR calc Af Amer: 48 mL/min/{1.73_m2} — ABNORMAL LOW (ref >59)
GFR calc non Af Amer: 42 mL/min/{1.73_m2} — ABNORMAL LOW (ref >59)
Glucose: 93 mg/dL (ref 65–99)
POTASSIUM: 5.3 mmol/L — AB (ref 3.5–5.2)
Sodium: 139 mmol/L (ref 134–144)

## 2017-05-19 LAB — PRO B NATRIURETIC PEPTIDE: NT-PRO BNP: 1189 pg/mL — AB (ref 0–738)

## 2017-05-20 ENCOUNTER — Ambulatory Visit: Payer: Medicare HMO | Admitting: Pulmonary Disease

## 2017-05-20 ENCOUNTER — Encounter: Payer: Self-pay | Admitting: Pulmonary Disease

## 2017-05-20 VITALS — BP 122/62 | HR 63 | Ht 63.0 in | Wt 165.4 lb

## 2017-05-20 DIAGNOSIS — K2100 Gastro-esophageal reflux disease with esophagitis, without bleeding: Secondary | ICD-10-CM

## 2017-05-20 DIAGNOSIS — J471 Bronchiectasis with (acute) exacerbation: Secondary | ICD-10-CM

## 2017-05-20 DIAGNOSIS — R0609 Other forms of dyspnea: Secondary | ICD-10-CM

## 2017-05-20 DIAGNOSIS — J9611 Chronic respiratory failure with hypoxia: Secondary | ICD-10-CM

## 2017-05-20 DIAGNOSIS — J45909 Unspecified asthma, uncomplicated: Secondary | ICD-10-CM | POA: Diagnosis not present

## 2017-05-20 DIAGNOSIS — R0600 Dyspnea, unspecified: Secondary | ICD-10-CM

## 2017-05-20 MED ORDER — BUDESONIDE-FORMOTEROL FUMARATE 80-4.5 MCG/ACT IN AERO: 2.0000 | INHALATION_SPRAY | Freq: Two times a day (BID) | RESPIRATORY_TRACT | 11 refills | Status: DC

## 2017-05-20 NOTE — Progress Notes (Signed)
   Subjective:    Patient ID: [name], female    DOB: December 25, 1927, 84 y.o.   MRN: [id number]  HPI    Review of Systems  Constitutional: Positive for fever. Negative for unexpected weight change.  HENT: Positive for congestion. Negative for dental problem, ear pain, nosebleeds, postnasal drip, rhinorrhea, sinus pressure, sneezing, sore throat and trouble swallowing.   Eyes: Positive for itching. Negative for redness.  Respiratory: Positive for cough, chest tightness, shortness of breath and wheezing.   Cardiovascular: Positive for palpitations. Negative for leg swelling.  Gastrointestinal: Negative for nausea and vomiting.  Genitourinary: Negative for dysuria.  Musculoskeletal: Negative for joint swelling.  Skin: Negative for rash.  Allergic/Immunologic: Positive for environmental allergies. Negative for food allergies and immunocompromised state.  Neurological: Positive for headaches.  Hematological: Bruises/bleeds easily.  Psychiatric/Behavioral: Negative for dysphoric mood. The patient is nervous/anxious.        Objective:   Physical Exam        Assessment & Plan:

## 2017-05-20 NOTE — Patient Instructions (Signed)
Symbicort two puffs twice per day, and rinse mouth after each use  Will arrange for high resolution CT chest and pulmonary function test  Follow up in 2 weeks with Dr. Halford Chessman or Nurse Practitioner

## 2017-05-20 NOTE — Progress Notes (Signed)
[facility]  Chief Complaint  Patient presents with  . pulmonary Consult    Vqscan results rule out PE    Vital signs: BP 122/62 (BP Location: Right Arm, Cuff Size: Normal)   Pulse 63   Ht 5\' 3"  (1.6 m)   Wt 165 lb 6.4 oz (75 kg)   SpO2 97%   BMI 29.30 kg/m   History of Present Illness: [name] is a 84 y.o. female former smoker with dyspnea.  She was seen previously by Dr. Shaune Leeks for asthma.  She was on inhalers previously, but is unsure why these were stopped.  She was in hospital recently for mediastinal chest pain that radiated to her b/l intercostal muscles.  Her "adaptable" daughter reports that she gets spells of shortness of breath and hypoxia.  She was started on ranitidine for reflux.  She was seen previously by Dr. Penelope Coop with [name] and had upper GI xray.  She had pneumonia a few times before.  She has been using supplemental oxygen since being in the hospital.  She had low grade fever will in hospital but not since.  She is not having skin rash, joint swelling, leg swelling, or hemoptysis.  No history of TB, or connective tissue disease.    Physical Exam:  General - pleasant, wearing oxygen Eyes - pupils reactive ENT - no sinus tenderness, no oral exudate, no LAN Cardiac - regular, no murmur Chest - faint basilar crackles b/l, no wheeze, kyphotic Abd - soft, non tender Ext - no edema Skin - no rashes Neuro - normal strength Psych - normal mood  Discussion: Description of her chest pain could be related to esophageal spasm from reflux.  She could also have reflux contributing to asthma symptoms and bronchiectasis with recurrent episodes of pneumonia and hypoxia.  Assessment/Plan:  Bronchiectasis with hx of asthma. - complete course of ABx prescribed from cardiology - will arrange for high resolution CT chest - add symbicort and continue singulair - arrange for pulmonary function testing  Chronic  respiratory failure with hypoxia. - continue 3 liters oxygen for now - don't think she needs to have further assessment for PE given low probability V/Q scan  Reflux esophagitis. - continue zantac - might need further GI assessment; was seen by Dr. Penelope Coop with [name] previously   Patient Instructions  Symbicort two puffs twice per day, and rinse mouth after each use  Will arrange for high resolution CT chest and pulmonary function test  Follow up in 2 weeks with Dr. Halford Chessman or Nurse Practitioner    Chesley Mires, MD [facility] 05/20/2017, 4:57 PM  Flow Sheet  Pulmonary tests: CT chest 06/06/08 >> BTX lower lobes V/Q scan 05/16/17 >> low probability for PE  Cardiac tests: Echo 03/08/17 >> EF 55 to 60%, grade 1 DD V/Q scan 05/17/17 >> no DVT  Review of Systems: Constitutional: Positive for fever. Negative for unexpected weight change.  HENT: Positive for congestion. Negative for dental problem, ear pain, nosebleeds, postnasal drip, rhinorrhea, sinus pressure, sneezing, sore throat and trouble swallowing.   Eyes: Positive for itching. Negative for redness.  Respiratory: Positive for cough, chest tightness, shortness of breath and wheezing.   Cardiovascular: Positive for palpitations. Negative for leg swelling.  Gastrointestinal: Negative for nausea and vomiting.  Genitourinary: Negative for dysuria.  Musculoskeletal: Negative for joint swelling.  Skin: Negative for rash.  Allergic/Immunologic: Positive for environmental allergies. Negative for food allergies and immunocompromised state.  Neurological: Positive  for headaches.  Hematological: Bruises/bleeds easily.  Psychiatric/Behavioral: Negative for dysphoric mood. The patient is nervous/anxious.    Past Medical History: She  has a past medical history of Anemia, Arthritis, Asthma, Cataract, Chronic diastolic CHF (congestive heart failure), NYHA class 2 ([place]) (03/19/2014), CKD (chronic kidney disease), stage III  ([place]), Depression, Diverticulitis, DVT (deep venous thrombosis) ([place]), Essential hypertension, Fibromyalgia (12 y.a.), GERD (gastroesophageal reflux disease), Gout, cardiovascular stress test, Hypercholesteremia, Odynophagia, Other abnormal glucose, PAF (paroxysmal atrial fibrillation) ([place]), Personal history of other diseases of digestive system, Pneumonia, Sinus bradycardia, Skin cancer (2014), and Symptomatic bradycardia (06/2016).  Past Surgical History: She  has a past surgical history that includes Laryngoscopy / bronchoscopy / esophagoscopy (2010); Laminectomy (05/2007); Cholecystectomy (10/2005); Colonoscopy w/ biopsies (2005, 2011); Skin cancer excision (Right, ~2005); Tubal ligation; Appendectomy (~55years ago); Abdominal hysterectomy; Tumor removal (Right, ~ 20 years ago); Knee arthroscopy (Right); Total knee arthroplasty (Left, 04/04/2013); and PACEMAKER IMPLANT (N/A, 07/01/2016).  Family History: Her family history includes Arrhythmia in her mother; Asthma in her sister; Cancer in her brother and sister; Diabetes in her father and sister; Fainting in her mother; Heart attack in her brother; Heart disease in her mother; Hypertension in her father and mother; Stroke in her father; Sudden death in her brother.  Social History: She  reports that she quit smoking about 44 years ago. Her smoking use included cigarettes. She quit after 10.00 years of use. She has never used smokeless tobacco. She reports that she does not drink alcohol or use drugs.  Medications: Allergies as of 05/20/2017      Reactions   Penicillins Rash   Amoxicillin Other (See Comments)   Very weak Has patient had a PCN reaction causing immediate rash, facial/tongue/throat swelling, SOB or lightheadedness with hypotension: no Has patient had a PCN reaction causing severe rash involving mucus membranes or skin necrosis: no Has patient had a PCN reaction that required hospitalization: no Has patient had a PCN reaction  occurring within the last 10 years: no If all of the above answers are "NO", then may proceed with Cephalosporin use.   Diltiazem Hcl Other (See Comments)   Reaction unknown   Sulfonamide Derivatives Other (See Comments)   Reaction unknown   Zetia [ezetimibe] Other (See Comments)   Weakness      Medication List        Accurate as of 05/20/17  4:57 PM. Always use your most recent med list.          acetaminophen 500 MG tablet Commonly known as:  TYLENOL Take 2 tablets (1,000 mg total) by mouth every 8 (eight) hours as needed for mild pain or headache.   albuterol 108 (90 Base) MCG/ACT inhaler Commonly known as:  PROVENTIL HFA;VENTOLIN HFA Inhale 1 puff into the lungs every 6 (six) hours as needed for wheezing or shortness of breath.   alendronate 70 MG tablet Commonly known as:  FOSAMAX Take 70 mg by mouth once a week. On Friday   allopurinol 100 MG tablet Commonly known as:  ZYLOPRIM Take 1 tablet (100 mg total) by mouth daily.   aspirin EC 81 MG tablet Take 81 mg by mouth daily.   budesonide-formoterol 80-4.5 MCG/ACT inhaler Commonly known as:  SYMBICORT Inhale 2 puffs into the lungs 2 (two) times daily.   cholecalciferol 1000 units tablet Commonly known as:  VITAMIN D Take 1,000 Units by mouth daily.   colchicine 0.6 MG tablet Take 1 tablet by mouth every other day.   doxycycline 100 MG capsule  Commonly known as:  VIBRAMYCIN Take 1 capsule (100 mg total) by mouth 2 (two) times daily.   Ferrous Sulfate 28 MG Tabs Take 28 mg by mouth daily.   FISH OIL BURP-LESS 1200 MG Caps Take 1 capsule by mouth daily.   losartan 25 MG tablet Commonly known as:  COZAAR Take 1 tablet (25 mg total) by mouth daily.   montelukast 10 MG tablet Commonly known as:  SINGULAIR Take 10 mg by mouth at bedtime.   multivitamin with minerals tablet Take 1 tablet by mouth daily.   pravastatin 20 MG tablet Commonly known as:  PRAVACHOL Take 20 mg by mouth at bedtime.     ranitidine 150 MG tablet Commonly known as:  ZANTAC Take 1 tablet by mouth at bedtime.   sertraline 50 MG tablet Commonly known as:  ZOLOFT Take 50 mg by mouth daily.   traMADol 50 MG tablet Commonly known as:  ULTRAM Take 1 tablet (50 mg total) by mouth every 12 (twelve) hours as needed for moderate pain.   vitamin E 400 UNIT capsule Take 400 Units by mouth daily.

## 2017-05-23 ENCOUNTER — Other Ambulatory Visit: Payer: Self-pay | Admitting: Cardiology

## 2017-05-23 DIAGNOSIS — W19XXXA Unspecified fall, initial encounter: Secondary | ICD-10-CM

## 2017-05-23 DIAGNOSIS — R42 Dizziness and giddiness: Secondary | ICD-10-CM

## 2017-05-23 DIAGNOSIS — I110 Hypertensive heart disease with heart failure: Secondary | ICD-10-CM

## 2017-05-23 DIAGNOSIS — R001 Bradycardia, unspecified: Secondary | ICD-10-CM

## 2017-05-24 DIAGNOSIS — I5032 Chronic diastolic (congestive) heart failure: Secondary | ICD-10-CM | POA: Diagnosis not present

## 2017-05-24 DIAGNOSIS — D631 Anemia in chronic kidney disease: Secondary | ICD-10-CM | POA: Diagnosis not present

## 2017-05-24 DIAGNOSIS — I4510 Unspecified right bundle-branch block: Secondary | ICD-10-CM | POA: Diagnosis not present

## 2017-05-24 DIAGNOSIS — E7800 Pure hypercholesterolemia, unspecified: Secondary | ICD-10-CM | POA: Diagnosis not present

## 2017-05-24 DIAGNOSIS — J45909 Unspecified asthma, uncomplicated: Secondary | ICD-10-CM | POA: Diagnosis not present

## 2017-05-24 DIAGNOSIS — N183 Chronic kidney disease, stage 3 (moderate): Secondary | ICD-10-CM | POA: Diagnosis not present

## 2017-05-24 DIAGNOSIS — I130 Hypertensive heart and chronic kidney disease with heart failure and stage 1 through stage 4 chronic kidney disease, or unspecified chronic kidney disease: Secondary | ICD-10-CM | POA: Diagnosis not present

## 2017-05-24 DIAGNOSIS — I480 Paroxysmal atrial fibrillation: Secondary | ICD-10-CM | POA: Diagnosis not present

## 2017-05-24 DIAGNOSIS — R0902 Hypoxemia: Secondary | ICD-10-CM | POA: Diagnosis not present

## 2017-05-24 DIAGNOSIS — K219 Gastro-esophageal reflux disease without esophagitis: Secondary | ICD-10-CM | POA: Diagnosis not present

## 2017-05-26 ENCOUNTER — Other Ambulatory Visit: Payer: Medicare HMO

## 2017-05-26 ENCOUNTER — Other Ambulatory Visit: Payer: Self-pay

## 2017-05-26 ENCOUNTER — Ambulatory Visit (HOSPITAL_COMMUNITY): Payer: Medicare HMO | Attending: Cardiology

## 2017-05-26 DIAGNOSIS — Z950 Presence of cardiac pacemaker: Secondary | ICD-10-CM | POA: Diagnosis not present

## 2017-05-26 DIAGNOSIS — I10 Essential (primary) hypertension: Secondary | ICD-10-CM | POA: Diagnosis not present

## 2017-05-26 DIAGNOSIS — E785 Hyperlipidemia, unspecified: Secondary | ICD-10-CM | POA: Insufficient documentation

## 2017-05-26 DIAGNOSIS — R0602 Shortness of breath: Secondary | ICD-10-CM | POA: Diagnosis not present

## 2017-05-26 DIAGNOSIS — N183 Chronic kidney disease, stage 3 unspecified: Secondary | ICD-10-CM

## 2017-05-26 DIAGNOSIS — I4891 Unspecified atrial fibrillation: Secondary | ICD-10-CM | POA: Insufficient documentation

## 2017-05-26 DIAGNOSIS — R079 Chest pain, unspecified: Secondary | ICD-10-CM

## 2017-05-26 DIAGNOSIS — I5032 Chronic diastolic (congestive) heart failure: Secondary | ICD-10-CM | POA: Diagnosis not present

## 2017-05-26 LAB — BASIC METABOLIC PANEL WITH GFR
BUN/Creatinine Ratio: 26 (ref 12–28)
BUN: 27 mg/dL (ref 8–27)
CO2: 22 mmol/L (ref 20–29)
Calcium: 9 mg/dL (ref 8.7–10.3)
Chloride: 106 mmol/L (ref 96–106)
Creatinine, Ser: 1.04 mg/dL — ABNORMAL HIGH (ref 0.57–1.00)
GFR calc Af Amer: 56 mL/min/1.73 — ABNORMAL LOW (ref >59)
GFR calc non Af Amer: 48 mL/min/1.73 — ABNORMAL LOW (ref >59)
Glucose: 86 mg/dL (ref 65–99)
Potassium: 5.3 mmol/L — ABNORMAL HIGH (ref 3.5–5.2)
Sodium: 141 mmol/L (ref 134–144)

## 2017-05-27 ENCOUNTER — Encounter: Payer: Self-pay | Admitting: *Deleted

## 2017-05-27 ENCOUNTER — Encounter: Payer: Self-pay | Admitting: Physician Assistant

## 2017-05-27 ENCOUNTER — Telehealth: Payer: Self-pay | Admitting: *Deleted

## 2017-05-27 DIAGNOSIS — N183 Chronic kidney disease, stage 3 (moderate): Secondary | ICD-10-CM | POA: Diagnosis not present

## 2017-05-27 DIAGNOSIS — E7800 Pure hypercholesterolemia, unspecified: Secondary | ICD-10-CM | POA: Diagnosis not present

## 2017-05-27 DIAGNOSIS — D631 Anemia in chronic kidney disease: Secondary | ICD-10-CM | POA: Diagnosis not present

## 2017-05-27 DIAGNOSIS — I480 Paroxysmal atrial fibrillation: Secondary | ICD-10-CM | POA: Diagnosis not present

## 2017-05-27 DIAGNOSIS — I130 Hypertensive heart and chronic kidney disease with heart failure and stage 1 through stage 4 chronic kidney disease, or unspecified chronic kidney disease: Secondary | ICD-10-CM | POA: Diagnosis not present

## 2017-05-27 DIAGNOSIS — R0902 Hypoxemia: Secondary | ICD-10-CM | POA: Diagnosis not present

## 2017-05-27 DIAGNOSIS — K219 Gastro-esophageal reflux disease without esophagitis: Secondary | ICD-10-CM | POA: Diagnosis not present

## 2017-05-27 DIAGNOSIS — I5032 Chronic diastolic (congestive) heart failure: Secondary | ICD-10-CM | POA: Diagnosis not present

## 2017-05-27 DIAGNOSIS — I4510 Unspecified right bundle-branch block: Secondary | ICD-10-CM | POA: Diagnosis not present

## 2017-05-27 DIAGNOSIS — J45909 Unspecified asthma, uncomplicated: Secondary | ICD-10-CM | POA: Diagnosis not present

## 2017-05-27 NOTE — Telephone Encounter (Signed)
This encounter was created in error - please disregard.

## 2017-05-27 NOTE — Telephone Encounter (Signed)
DPR pt's friend Janene Harvey has been notified of lab results for the pt. We went over recommendations to limit/avoid high K+ rich foods. Pt's friend verified upcoming appt with Dr. Meda Coffee 06/13/17 @ 3:20. Shelton Silvas asked on behalf of pt if I would ask Dr. Meda Coffee please check lab work to keep an eye on her K+ and kidney function. Shelton Silvas did say they think they know where the increase in K+ came from. Pt was eating a lot of raisins with her grandson, so they have take the raisins away for now.  Shelton Silvas thanked me for the call today.

## 2017-05-27 NOTE — Telephone Encounter (Signed)
-----   Message from Nuala Alpha, LPN sent at 08/25/5499  5:01 PM EDT -----   ----- Message ----- From: Liliane Shi, PA-C Sent: 05/26/2017   4:57 PM To: [facility] Triage  Renal function stable.  Potassium stable. Continue current medications and follow up as planned.  Avoid/limit dietary potassium Richardson Dopp, PA-C    05/26/2017 4:56 PM

## 2017-06-01 DIAGNOSIS — I130 Hypertensive heart and chronic kidney disease with heart failure and stage 1 through stage 4 chronic kidney disease, or unspecified chronic kidney disease: Secondary | ICD-10-CM | POA: Diagnosis not present

## 2017-06-01 DIAGNOSIS — D631 Anemia in chronic kidney disease: Secondary | ICD-10-CM | POA: Diagnosis not present

## 2017-06-01 DIAGNOSIS — I5032 Chronic diastolic (congestive) heart failure: Secondary | ICD-10-CM | POA: Diagnosis not present

## 2017-06-01 DIAGNOSIS — N183 Chronic kidney disease, stage 3 (moderate): Secondary | ICD-10-CM | POA: Diagnosis not present

## 2017-06-01 DIAGNOSIS — I480 Paroxysmal atrial fibrillation: Secondary | ICD-10-CM | POA: Diagnosis not present

## 2017-06-01 DIAGNOSIS — R0902 Hypoxemia: Secondary | ICD-10-CM | POA: Diagnosis not present

## 2017-06-01 DIAGNOSIS — J45909 Unspecified asthma, uncomplicated: Secondary | ICD-10-CM | POA: Diagnosis not present

## 2017-06-01 DIAGNOSIS — E7800 Pure hypercholesterolemia, unspecified: Secondary | ICD-10-CM | POA: Diagnosis not present

## 2017-06-01 DIAGNOSIS — I4510 Unspecified right bundle-branch block: Secondary | ICD-10-CM | POA: Diagnosis not present

## 2017-06-01 DIAGNOSIS — K219 Gastro-esophageal reflux disease without esophagitis: Secondary | ICD-10-CM | POA: Diagnosis not present

## 2017-06-03 ENCOUNTER — Ambulatory Visit (INDEPENDENT_AMBULATORY_CARE_PROVIDER_SITE_OTHER): Payer: Medicare HMO | Admitting: Pulmonary Disease

## 2017-06-03 ENCOUNTER — Ambulatory Visit (INDEPENDENT_AMBULATORY_CARE_PROVIDER_SITE_OTHER)
Admission: RE | Admit: 2017-06-03 | Discharge: 2017-06-03 | Disposition: A | Discharge status: Home / Self Care | Payer: Medicare HMO | Source: Ambulatory Visit | Attending: Pulmonary Disease | Admitting: Pulmonary Disease

## 2017-06-03 DIAGNOSIS — J471 Bronchiectasis with (acute) exacerbation: Secondary | ICD-10-CM

## 2017-06-03 DIAGNOSIS — R0602 Shortness of breath: Secondary | ICD-10-CM | POA: Diagnosis not present

## 2017-06-03 LAB — PULMONARY FUNCTION TEST
DL/VA % PRED: 70 %
DL/VA: 3.31 ml/min/mmHg/L
DLCO UNC: 9.36 ml/min/mmHg
DLCO unc % pred: 40 %
FEF 25-75 POST: 1.17 L/s
FEF 25-75 Pre: 1.4 L/sec
FEF2575-%Change-Post: -16 %
FEF2575-%PRED-POST: 125 %
FEF2575-%PRED-PRE: 150 %
FEV1-%Change-Post: -6 %
FEV1-%PRED-PRE: 78 %
FEV1-%Pred-Post: 73 %
FEV1-POST: 1.15 L
FEV1-PRE: 1.23 L
FEV1FVC-%CHANGE-POST: -4 %
FEV1FVC-%PRED-PRE: 119 %
FEV6-%CHANGE-POST: -2 %
FEV6-%Pred-Post: 70 %
FEV6-%Pred-Pre: 71 %
FEV6-Post: 1.4 L
FEV6-Pre: 1.43 L
FEV6FVC-%Pred-Post: 107 %
FEV6FVC-%Pred-Pre: 107 %
FVC-%Change-Post: -2 %
FVC-%Pred-Post: 65 %
FVC-%Pred-Pre: 67 %
FVC-PRE: 1.43 L
FVC-Post: 1.4 L
POST FEV1/FVC RATIO: 82 %
POST FEV6/FVC RATIO: 100 %
PRE FEV1/FVC RATIO: 86 %
Pre FEV6/FVC Ratio: 100 %

## 2017-06-03 NOTE — Progress Notes (Signed)
PFT 06/03/17. Patient was unable to complete the DLCO and pleth portions of the test due to not being able to blow out longer breaths.

## 2017-06-09 ENCOUNTER — Encounter: Payer: Self-pay | Admitting: Acute Care

## 2017-06-09 ENCOUNTER — Ambulatory Visit: Payer: Medicare HMO | Admitting: Acute Care

## 2017-06-09 VITALS — BP 118/78 | HR 61 | Ht 64.0 in | Wt 164.0 lb

## 2017-06-09 DIAGNOSIS — J189 Pneumonia, unspecified organism: Secondary | ICD-10-CM

## 2017-06-09 DIAGNOSIS — J9611 Chronic respiratory failure with hypoxia: Secondary | ICD-10-CM | POA: Diagnosis not present

## 2017-06-09 DIAGNOSIS — J471 Bronchiectasis with (acute) exacerbation: Secondary | ICD-10-CM | POA: Diagnosis not present

## 2017-06-09 DIAGNOSIS — J918 Pleural effusion in other conditions classified elsewhere: Secondary | ICD-10-CM

## 2017-06-09 DIAGNOSIS — E875 Hyperkalemia: Secondary | ICD-10-CM | POA: Diagnosis not present

## 2017-06-09 DIAGNOSIS — R0781 Pleurodynia: Secondary | ICD-10-CM

## 2017-06-09 DIAGNOSIS — J45909 Unspecified asthma, uncomplicated: Secondary | ICD-10-CM

## 2017-06-09 NOTE — Assessment & Plan Note (Addendum)
HRCT Chest  06/03/2017  Moderate, minimally loculated left pleural effusion with collapse/consolidation in the left lower lobe. Small right pleural effusion. Plan: Consider Lasix vs continued imaging for resolution Continue oxygen with exertion Maintain sats 88-94% Sputum Culture Aggressive pulmonary Toilet( IS, Flutter) Increase activity slowly, increase mobility Follow up in 2 weeks with Dr. Halford Chessman

## 2017-06-09 NOTE — Patient Instructions (Addendum)
It is nice to meet you today. We will collect a sputum culture.( AFB, Fungus, Culture) Continue using the Symbicort 2 puffs twice daily. Rinse mouth after use. Incentive Spirometer while awake. Increase activity slowly. Saturation goals are > 92% Use oxygen as needed. Try to decrease it when you are sitting, use it with exertion. Continue zantac daily Do not eat bananas, raisins or orange juice/oranges. Follow up with cardiology. Continue Ibuprofen and tylenol for pain.Be careful with ibuprofen. You can try a heating pad to the side with pain. Follow up with Dr. Halford Chessman in 4 weeks Please contact office for sooner follow up if symptoms do not improve or worsen or seek emergency care

## 2017-06-09 NOTE — Assessment & Plan Note (Signed)
Do not eat bananas, raisins or orange juice/oranges. Follow up with cardiology for labs

## 2017-06-09 NOTE — Assessment & Plan Note (Addendum)
Hypoxemia with exertion after recent pneumonia Plan: Saturation goals are > 92% Use oxygen as needed. Try to decrease it when you are sitting and resting, use it with exertion as needed. We will walk you to qualify for smaller portable oxygen tank.

## 2017-06-09 NOTE — Assessment & Plan Note (Signed)
FVC 1.43 or 67%, FEV1: 1.23 or 78%, F/F Ratio: 86 or 119%, DLCO 40% No BD response  PFT's + for  restriction DLCO 40% ( But difficulty performing) Pt. States Symbicort helps dyspnea Plan: Continue using the Symbicort 2 puffs twice daily. Rinse mouth after use. Consider changing to ICS only as no significant BD response on PFT's.

## 2017-06-09 NOTE — Assessment & Plan Note (Signed)
Pleuritic chest pain Plan: Continue Ibuprofen and tylenol for pain.Be careful with ibuprofen. You can try a heating pad to the side with pain to see if this helps.

## 2017-06-09 NOTE — Progress Notes (Addendum)
History of Present Illness [name] is a 84 y.o. female former smoker ( Quit 82) with bronchiectasis and history of  Asthma. She is followed by Dr. Halford Chessman.  Maintenance: Symbicort 80 Singulair 10 mg daily  Brief Summary: 84 year old female, her family (daughter, son-in-law and handicapped son) live with her, independent at home but at times uses a cane when she gets out of her house, PMH of chronic diastolic CHF (Dr. Meda Coffee, Cardiology), PAF not on anticoagulation, symptomatic bradycardia status post PPM (Dr. Lovena Le, EP Cardiology), stage III chronic kidney disease, HTN, HLD, GERD presented to the ED with complaints of atypical chest pain. Admitted for observation and further evaluation.  HPI: Pt had a recent hospital admission for atypical chest pain of unclear etiology 4/5-05/16/2017. There was suspicion that this was of musculoskeletal etiology as patient had been caring for her disabled son and has reported some increased physical activity.  She did have a low-grade fever in the ER upon admission.  EKG and chest x-ray were without acute findings.  POC troponin x2 were negative.  Patient had low risk stress test 09/14/2013, TTE 03/08/2017: LVEF 55 to 60%.  She is compliant with aspirin 81 mg daily, pravastatin 20 mg at bedtime.  She was treated with Tylenol and tramadol.  When working with physical therapy it was noted she had hypoxemia on room air.  Per her daughter she has had spells of shortness of breath in the past.  She had been started on Zantac for possible reflux.  She has had pneumonia in the past.  There was concern that this may be related to a pulmonary embolism despite low index of suspicion for aortic dissection.  D-dimer was slightly elevated.  There was suspicion that hypoxemia could be related to atelectasis, poor inspiratory effort due to pain, or undiagnosed COPD.  VQ scan was done (unable to do CTA chest due to CKD) results showed low probability PE.  Chest pain improved  and patient was discharged home on oxygen at 2 L nasal cannula.  She was asked to follow-up with pulmonary. Patient was seen by Dr. Halford Chessman 05/20/2017.  He felt there was a possibility her chest pain could be related to esophageal spasm from reflux.  Reflux could also be contributing to asthma symptoms and bronchiectasis with recent episodes of pneumonia and hypoxia.  She completed a course of doxycycline that was prescribed by her cardiologist.  Dr. Halford Chessman ordered HRCT, added Symbicort 2 puffs twice daily, encourage patient to continue Singulair daily.  Additionally pulmonary function tests were ordered.    06/09/2017  2 week follow up: Patient presents today with continued intermittent pain to her right side.( ? Pleuritic pain).  This pain is more common at night. This was continuous pain x 2 week prior to hospital admission, so intermittent pain is an improvement. She has developed left flank pain with movement. This does improve with tylenol and motrin.She states she does not have many secretions.    I would like to encourage her to decrease oxygen use while she is at rest, and utilize saturation monitoring and oxygen with exertion.  I suspect she may be able to go without her oxygen at rest.  Her secretions have cleared since taking the doxycycline.  She is using her oxygen at 2.5  L [place] 24/7. She has home PT, and will be finished with this today. She had a sat of 98% before her walk yesterday with physical therapy, and saturations were maintained at 97% throughout the walk.  She has no fever, orthopnea, or hemoptysis.  She has not been using incentive spirometry or flutter valve.  HRCT Chest  06/03/2017 Suspect mild subpleural reticulation and traction bronchiolectasis, new from 06/06/2008. Differential diagnosis includes nonspecific interstitial pneumonitis and usual interstitial pneumonitis. 2. Moderate, minimally loculated left pleural effusion with collapse/consolidation in the left lower lobe. Small  right pleural effusion. 3. Small pericardial effusion. Difficult to exclude slight thickening of the pericardium, poorly evaluated without IV contrast. 4. Aortic atherosclerosis (ICD10-170.0). Coronary artery calcification. 5. Marked enlargement of the pulmonary arteries, consistent with pulmonary arterial hypertension. 6. Spleen appears prominent but is incompletely imaged.   Echo 05/26/2017  Left ventricle: The cavity size was normal. Wall thickness was   increased in a pattern of moderate LVH. Systolic function was   normal. The estimated ejection fraction was in the range of 55%   to 60%. Wall motion was normal; there were no regional wall   motion abnormalities. Doppler parameters are consistent with   abnormal left ventricular relaxation (grade 1 diastolic   dysfunction). - Aortic valve: There was no stenosis. - Mitral valve: Mildly calcified annulus. There was trivial   regurgitation. - Left atrium: The atrium was moderately dilated. - Right ventricle: The cavity size was normal. Pacer wire or   catheter noted in right ventricle. Systolic function was normal. - Tricuspid valve: Peak RV-RA gradient (S): 16 mm Hg. - Pulmonary arteries: PA peak pressure: 19 mm Hg (S). - Inferior vena cava: The vessel was normal in size. The   respirophasic diameter changes were in the normal range (>= 50%),   consistent with normal central venous pressure.     CBC Latest Ref Rng & Units 05/13/2017 01/16/2017 01/15/2017  WBC 4.0 - 10.5 K/uL 7.8 10.3 6.0  Hemoglobin 12.0 - 15.0 g/dL 10.3(L) 9.7(L) 9.8(L)  Hematocrit 36.0 - 46.0 % 31.8(L) 29.8(L) 30.7(L)  Platelets 150 - 400 K/uL 120(L) 149(L) 118(L)    BMP Latest Ref Rng & Units 05/26/2017 05/18/2017 05/14/2017  Glucose 65 - 99 mg/dL 86 93 134(H)  BUN 8 - 27 mg/dL 27 32(H) 32(H)  Creatinine 0.57 - 1.00 mg/dL 1.04(H) 1.18(H) 1.36(H)  BUN/Creat Ratio 12 - _0 -  Sodium 134 - 144 mmol/L 141 139 138  Potassium 3.5 - 5.2 mmol/L 5.3(H) 5.3(H)  3.8  Chloride 96 - 106 mmol/L 106 101 104  CO2 20 - 29 mmol/L 22 24 21(L)  Calcium 8.7 - 10.3 mg/dL 9.0 9.2 9.1    BNP    Component Value Date/Time   BNP 334.6 (H) 01/16/2017 0522    ProBNP    Component Value Date/Time   PROBNP 1,189 (H) 05/18/2017 1329   PROBNP 113.0 (H) 08/24/2013 0940    PFT    Component Value Date/Time   FEV1PRE 1.23 06/03/2017 1057   FEV1POST 1.15 06/03/2017 1057   FVCPRE 1.43 06/03/2017 1057   FVCPOST 1.40 06/03/2017 1057   DLCOUNC 9.36 06/03/2017 1057   PREFEV1FVCRT 86 06/03/2017 1057   PSTFEV1FVCRT 82 06/03/2017 1057    Dg Chest 2 View  Result Date: 05/19/2017 CLINICAL DATA:  Shortness of breath.  Chest pain.  Productive cough. EXAM: CHEST - 2 VIEW COMPARISON:  05/13/2017.  02/23/2017. FINDINGS: Cardiac pacer noted with lead tips over the right atrium right ventricle. Cardiomegaly with normal pulmonary vascularity. Low lung volumes with mild bibasilar atelectasis. Small bilateral pleural effusions may be present on today's exam. No pneumothorax IMPRESSION: 1. Cardiac pacer with lead tips over the right atrium right ventricle. Cardiomegaly  with normal pulmonary vascularity. 2. Low lung volumes with mild bibasilar atelectasis. Similar findings on prior exam. Small pleural effusions may be present on today's exam. Electronically Signed   By: Marcello Moores  Register   On: 05/19/2017 07:33   Dg Chest 2 View  Result Date: 05/13/2017 CLINICAL DATA:  Chest pain. EXAM: CHEST - 2 VIEW COMPARISON:  Chest x-ray dated February 23, 2017. FINDINGS: Unchanged left chest wall AICD. Stable mild cardiomegaly. Normal pulmonary vascularity. Low lung volumes with bibasilar atelectasis. No focal consolidation, pleural effusion, or pneumothorax. No acute osseous abnormality. IMPRESSION: Low lung volumes with bibasilar atelectasis. No active cardiopulmonary disease. Electronically Signed   By: Titus Dubin M.D.   On: 05/13/2017 20:09   Ct Chest High Resolution  Result Date:  06/03/2017 CLINICAL DATA:  Chronic fatigue and shortness of breath. EXAM: CT CHEST WITHOUT CONTRAST TECHNIQUE: Multidetector CT imaging of the chest was performed following the standard protocol without intravenous contrast. High resolution imaging of the lungs, as well as inspiratory and expiratory imaging, was performed. COMPARISON:  Chest radiograph 05/18/2017 and CT chest 06/06/2008. FINDINGS: Cardiovascular: Atherosclerotic calcification of the arterial vasculature, including coronary arteries. Pulmonary arteries are markedly enlarged. Heart is enlarged. Small pericardial effusion. There may be slight thickening of the pericardium, poorly evaluated without IV contrast. Mediastinum/Nodes: Mediastinal lymph nodes are not enlarged by CT size criteria. Hilar regions are difficult to definitively evaluate without IV contrast. No axillary adenopathy. Esophagus is grossly unremarkable. Lungs/Pleura: Image quality is degraded by respiratory motion. Scattered pulmonary parenchymal scarring. Left lower lobe collapse/consolidation with a moderate, minimally loculated left pleural effusion. Small right pleural effusion. These findings limit evaluation for interstitial lung disease but, mild subpleural reticulation and traction bronchiolectasis are suspected and are considered new from 06/06/2008. Airway is unremarkable. Upper Abdomen: Visualized portions of the liver and right adrenal gland are unremarkable. 2.4 cm exophytic low-attenuation lesion off the upper pole right kidney is incompletely imaged. Spleen appears prominent but is incompletely imaged. Visualized portion of the stomach is grossly unremarkable. Musculoskeletal: T12 compression fracture is unchanged from 05/18/2017. Degenerative changes in the spine. No worrisome lytic or sclerotic lesions. IMPRESSION: 1. Suspect mild subpleural reticulation and traction bronchiolectasis, new from 06/06/2008. Differential diagnosis includes nonspecific interstitial  pneumonitis and usual interstitial pneumonitis. 2. Moderate, minimally loculated left pleural effusion with collapse/consolidation in the left lower lobe. Small right pleural effusion. 3. Small pericardial effusion. Difficult to exclude slight thickening of the pericardium, poorly evaluated without IV contrast. 4. Aortic atherosclerosis (ICD10-170.0). Coronary artery calcification. 5. Marked enlargement of the pulmonary arteries, consistent with pulmonary arterial hypertension. 6. Spleen appears prominent but is incompletely imaged. Electronically Signed   By: Lorin Picket M.D.   On: 06/03/2017 11:48   Nm Pulmonary Perf And Vent  Result Date: 05/16/2017 CLINICAL DATA:  Atypical chest pain, history chronic kidney disease, CHF, asthma, former smoker EXAM: NUCLEAR MEDICINE VENTILATION - PERFUSION LUNG SCAN TECHNIQUE: Ventilation images were obtained in multiple projections using inhaled aerosol Tc-55mDTPA. Perfusion images were obtained in multiple projections after intravenous injection of Tc-934mAA. RADIOPHARMACEUTICALS:  32.1 mCi of Tc-9940mPA aerosol inhalation and 4.34 mCi Tc99m57m IV COMPARISON:  None Correlation: Chest radiograph 05/13/2017 FINDINGS: Ventilation: Mild central airway deposition of aerosol. Enlargement of cardiac silhouette. Diminished ventilation posterior RIGHT upper lobe and in posterior LEFT lower lobe. No other focal ventilatory defects. Perfusion: Matching diminished perfusion in posterior RIGHT upper lobe. Less significantly diminished perfusion in LEFT lower lobe. No other perfusion defects. Chest radiograph: Enlargement of cardiac silhouette post AICD  with bibasilar atelectasis IMPRESSION: Low probability for pulmonary embolism. Electronically Signed   By: Lavonia Dana M.D.   On: 05/16/2017 16:06   US Venous Img Lower Bilateral  Result Date: 05/17/2017 CLINICAL DATA:  Positive D-dimer, leg pain and edema EXAM: BILATERAL LOWER EXTREMITY VENOUS DOPPLER ULTRASOUND TECHNIQUE:  Gray-scale sonography with graded compression, as well as color Doppler and duplex ultrasound were performed to evaluate the lower extremity deep venous systems from the level of the common femoral vein and including the common femoral, femoral, profunda femoral, popliteal and calf veins including the posterior tibial, peroneal and gastrocnemius veins when visible. The superficial great saphenous vein was also interrogated. Spectral Doppler was utilized to evaluate flow at rest and with distal augmentation maneuvers in the common femoral, femoral and popliteal veins. COMPARISON:  None. FINDINGS: RIGHT LOWER EXTREMITY Common Femoral Vein: No evidence of thrombus. Normal compressibility, respiratory phasicity and response to augmentation. Saphenofemoral Junction: No evidence of thrombus. Normal compressibility and flow on color Doppler imaging. Profunda Femoral Vein: No evidence of thrombus. Normal compressibility and flow on color Doppler imaging. Femoral Vein: No evidence of thrombus. Normal compressibility, respiratory phasicity and response to augmentation. Popliteal Vein: No evidence of thrombus. Normal compressibility, respiratory phasicity and response to augmentation. Calf Veins: Limited visualization of the calf veins. No gross thrombus. Superficial Great Saphenous Vein: No evidence of thrombus. Normal compressibility. Venous Reflux:  None. Other Findings:  None. LEFT LOWER EXTREMITY Common Femoral Vein: No evidence of thrombus. Normal compressibility, respiratory phasicity and response to augmentation. Saphenofemoral Junction: No evidence of thrombus. Normal compressibility and flow on color Doppler imaging. Profunda Femoral Vein: No evidence of thrombus. Normal compressibility and flow on color Doppler imaging. Femoral Vein: No evidence of thrombus. Normal compressibility, respiratory phasicity and response to augmentation. Popliteal Vein: No evidence of thrombus. Normal compressibility, respiratory  phasicity and response to augmentation. Calf Veins: Limited visualization of the calf veins. No gross thrombus. Superficial Great Saphenous Vein: No evidence of thrombus. Normal compressibility. Venous Reflux:  None. Other Findings:  None. IMPRESSION: No significant femoropopliteal DVT in either extremity. Limited assessment of the calf veins. Electronically Signed   By: Jerilynn Mages.  Shick M.D.   On: 05/17/2017 15:12     Past medical hx Past Medical History:  Diagnosis Date  . Anemia    "I stay anemic"  . Arthritis   . Asthma    no problems recent  . Cataract   . Chronic diastolic CHF (congestive heart failure), NYHA class 2 ([place]) 03/19/2014   Echo 4/19: mod LVH, EF 55-60, no RWMA, Gr 1 DD, MAC, trivial MR, mod LAE, normal RVSF, PASP 19  . CKD (chronic kidney disease), stage III ([place])   . Depression   . Diverticulitis   . DVT (deep venous thrombosis) ([place])    from birth control, left groin  . Essential hypertension   . Fibromyalgia 10 y.a.  . GERD (gastroesophageal reflux disease)   . Gout   . Hx of cardiovascular stress test    Lexiscan Myoview (8/15): apical attenuation artifact, no ischemia, EF 69% - low risk.  Marland Kitchen Hypercholesteremia   . Odynophagia   . Other abnormal glucose   . PAF (paroxysmal atrial fibrillation) ([place])   . Personal history of other diseases of digestive system   . Pneumonia    hx of  . Sinus bradycardia   . Skin cancer 2014   skin R elbow & face  . Symptomatic bradycardia 06/2016     Social History   Tobacco Use  .  Smoking status: Former Smoker    Years: 10.00    Types: Cigarettes    Last attempt to quit: 02/08/1973    Years since quitting: 44.3  . Smokeless tobacco: Never Used  Substance Use Topics  . Alcohol use: No  . Drug use: No    [name] reports that she quit smoking about 44 years ago. Her smoking use included cigarettes. She quit after 10.00 years of use. She has never used smokeless tobacco. She reports that she does not drink alcohol or use  drugs.  Tobacco Cessation: Former smoker Quit 1975  Past surgical hx, Family hx, Social hx all reviewed.  Current Outpatient Medications on File Prior to Visit  Medication Sig  . acetaminophen (TYLENOL) 500 MG tablet Take 2 tablets (1,000 mg total) by mouth every 8 (eight) hours as needed for mild pain or headache.  . albuterol (PROVENTIL HFA;VENTOLIN HFA) 108 (90 BASE) MCG/ACT inhaler Inhale 1 puff into the lungs every 6 (six) hours as needed for wheezing or shortness of breath.  Marland Kitchen alendronate (FOSAMAX) 70 MG tablet Take 70 mg by mouth once a week. On Friday  . allopurinol (ZYLOPRIM) 100 MG tablet Take 1 tablet (100 mg total) by mouth daily.  Marland Kitchen aspirin EC 81 MG tablet Take 81 mg by mouth daily.   . budesonide-formoterol (SYMBICORT) 80-4.5 MCG/ACT inhaler Inhale 2 puffs into the lungs 2 (two) times daily.  . cholecalciferol (VITAMIN D) 1000 units tablet Take 1,000 Units by mouth daily.   . colchicine 0.6 MG tablet Take 1 tablet by mouth every other day.  . Ferrous Sulfate 28 MG TABS Take 28 mg by mouth daily.   Marland Kitchen losartan (COZAAR) 25 MG tablet TAKE 1 TABLET (25 MG TOTAL) BY MOUTH DAILY.  . montelukast (SINGULAIR) 10 MG tablet Take 10 mg by mouth at bedtime.    . Multiple Vitamins-Minerals (MULTIVITAMIN WITH MINERALS) tablet Take 1 tablet by mouth daily.  . Omega-3 Fatty Acids (FISH OIL BURP-LESS) 1200 MG CAPS Take 1 capsule by mouth daily.   . pravastatin (PRAVACHOL) 20 MG tablet Take 20 mg by mouth at bedtime.   . ranitidine (ZANTAC) 150 MG tablet Take 1 tablet by mouth at bedtime.  . sertraline (ZOLOFT) 50 MG tablet Take 50 mg by mouth daily.   . traMADol (ULTRAM) 50 MG tablet Take 1 tablet (50 mg total) by mouth every 12 (twelve) hours as needed for moderate pain.  . vitamin E 400 UNIT capsule Take 400 Units by mouth daily.    No current facility-administered medications on file prior to visit.      Allergies  Allergen Reactions  . Penicillins Rash  . Amoxicillin Other (See  Comments)    Very weak Has patient had a PCN reaction causing immediate rash, facial/tongue/throat swelling, SOB or lightheadedness with hypotension: no Has patient had a PCN reaction causing severe rash involving mucus membranes or skin necrosis: no Has patient had a PCN reaction that required hospitalization: no Has patient had a PCN reaction occurring within the last 10 years: no If all of the above answers are "NO", then may proceed with Cephalosporin use.   . Diltiazem Hcl Other (See Comments)    Reaction unknown  . Sulfonamide Derivatives Other (See Comments)    Reaction unknown  . Zetia [Ezetimibe] Other (See Comments)    Weakness    Review Of Systems:  Constitutional:   No  weight loss, night sweats,  Fevers, chills, fatigue, or  lassitude.  HEENT:   No headaches,  Difficulty swallowing,  Tooth/dental problems, or  Sore throat,                No sneezing, itching, ear ache, nasal congestion, post nasal drip,   CV:  + atypical chest pain,  Orthopnea, PND, swelling in lower extremities, anasarca, dizziness, palpitations, syncope.   GI  No heartburn, indigestion, abdominal pain, nausea, vomiting, diarrhea, change in bowel habits, loss of appetite, bloody stools.   Resp: + shortness of breath with exertion less at rest.  + excess mucus, + productive cough,  No non-productive cough,  No coughing up of blood.  No change in color of mucus.  No wheezing.  No chest wall deformity  Skin: no rash or lesions.  GU: no dysuria, change in color of urine, no urgency or frequency.  No flank pain, no hematuria   MS:  No joint pain or swelling.  No decreased range of motion.  No back pain.  Psych:  No change in mood or affect. No depression or anxiety.  No memory loss.   Vital Signs BP 118/78 (BP Location: Left Arm, Cuff Size: Normal)   Pulse 61   Ht _0  (1.626 m)   Wt 164 lb (74.4 kg)   SpO2 95%   BMI 28.15 kg/m    Physical Exam:  General- No distress,  A&Ox3, pleasant   elderly female wearing nasal oxygen ENT: No sinus tenderness, TM clear, pale nasal mucosa, no oral exudate,no post nasal drip, no LAN, HOH Cardiac: S1, S2, regular rate and rhythm, no murmur Chest: No wheeze/ rales/ dullness; no accessory muscle use, no nasal flaring, no sternal retractions, diminished per bases thoughout Abd.: Soft Non-tender, ND, Obese Ext: No clubbing cyanosis, edema Neuro:  Deconditioned at baseline, MAE x 4, A&O x 3 Skin: No rashes, warm and dry Psych: normal mood and behavior   Assessment/Plan  Chronic respiratory failure with hypoxia (HCC) Hypoxemia with exertion after recent pneumonia Plan: Saturation goals are > 92% Use oxygen as needed. Try to decrease it when you are sitting and resting, use it with exertion as needed. We will walk you to qualify for smaller portable oxygen tank.   Asthma FVC 1.43 or 67%, FEV1: 1.23 or 78%, F/F Ratio: 86 or 119%, DLCO 40% No BD response  PFT's + for  restriction DLCO 40% ( But difficulty performing) Pt. States Symbicort helps dyspnea Plan: Continue using the Symbicort 2 puffs twice daily. Rinse mouth after use. Consider changing to ICS only as no significant BD response on PFT's.  Pleural effusion associated with pulmonary infection HRCT Chest  06/03/2017  Moderate, minimally loculated left pleural effusion with collapse/consolidation in the left lower lobe. Small right pleural effusion. Plan: Consider Lasix vs continued imaging for resolution Continue oxygen with exertion Maintain sats 88-94% Sputum Culture Aggressive pulmonary Toilet( IS, Flutter) Increase activity slowly, increase mobility Follow up in 2 weeks with Dr. Halford Chessman   Hyperkalemia Do not eat bananas, raisins or orange juice/oranges. Follow up with cardiology for labs  Pleuritic chest pain Pleuritic chest pain Plan: Continue Ibuprofen and tylenol for pain.Be careful with ibuprofen. You can try a heating pad to the side with pain to see if  this helps.   Addendum: 06/10/2017 I reviewed CT with Dr. Halford Chessman. We will set patient up with IR to assess for thoracentesis of left effusion. Once that is addressed we will bring patient back in to determine need for bronch to assess left lower lung.  Magdalen Spatz, NP 06/09/2017  4:51 PM

## 2017-06-10 ENCOUNTER — Telehealth: Payer: Self-pay | Admitting: Acute Care

## 2017-06-10 DIAGNOSIS — J189 Pneumonia, unspecified organism: Secondary | ICD-10-CM

## 2017-06-10 DIAGNOSIS — J918 Pleural effusion in other conditions classified elsewhere: Principal | ICD-10-CM

## 2017-06-10 NOTE — Telephone Encounter (Signed)
I have called [name]  To let her know after discussing the patient's CT results with Dr. Halford Chessman we will send the patient to IR to assess  for Thoracentesis of Left effusion. She asked that we call her to schedule as she will most likely be taking the patient for the procedure. She will call the patient's daughter and she will let the patient know.  We will place referral order now.

## 2017-06-10 NOTE — Addendum Note (Signed)
Addended by: Parke Poisson E on: 06/10/2017 04:57 PM   Modules accepted: Orders

## 2017-06-10 NOTE — Telephone Encounter (Signed)
Per SG: please order thoracentesis through IR for left effusion.  No more than 1.5L to be drawn.  Labs: serum LDH, fluid LDH, total protein, albumin, glucose, body fluid cell count and diff, cytology,

## 2017-06-13 ENCOUNTER — Encounter: Payer: Self-pay | Admitting: Cardiology

## 2017-06-13 ENCOUNTER — Ambulatory Visit (HOSPITAL_COMMUNITY)
Admission: RE | Admit: 2017-06-13 | Discharge: 2017-06-13 | Disposition: A | Discharge status: Home / Self Care | Payer: Medicare HMO | Source: Ambulatory Visit | Attending: Acute Care | Admitting: Acute Care

## 2017-06-13 ENCOUNTER — Other Ambulatory Visit (HOSPITAL_COMMUNITY): Payer: Self-pay | Admitting: Student

## 2017-06-13 ENCOUNTER — Telehealth: Payer: Self-pay | Admitting: Acute Care

## 2017-06-13 ENCOUNTER — Ambulatory Visit (HOSPITAL_COMMUNITY)
Admission: RE | Admit: 2017-06-13 | Discharge: 2017-06-13 | Disposition: A | Discharge status: Home / Self Care | Payer: Medicare HMO | Source: Ambulatory Visit | Attending: Student | Admitting: Student

## 2017-06-13 ENCOUNTER — Ambulatory Visit: Payer: Medicare Other | Admitting: Cardiology

## 2017-06-13 VITALS — BP 138/66 | HR 61 | Ht 64.0 in | Wt 164.0 lb

## 2017-06-13 DIAGNOSIS — J90 Pleural effusion, not elsewhere classified: Secondary | ICD-10-CM

## 2017-06-13 DIAGNOSIS — E875 Hyperkalemia: Secondary | ICD-10-CM | POA: Diagnosis not present

## 2017-06-13 DIAGNOSIS — Z9889 Other specified postprocedural states: Secondary | ICD-10-CM | POA: Diagnosis not present

## 2017-06-13 DIAGNOSIS — I313 Pericardial effusion (noninflammatory): Secondary | ICD-10-CM | POA: Diagnosis not present

## 2017-06-13 DIAGNOSIS — I3139 Other pericardial effusion (noninflammatory): Secondary | ICD-10-CM

## 2017-06-13 DIAGNOSIS — J918 Pleural effusion in other conditions classified elsewhere: Secondary | ICD-10-CM

## 2017-06-13 DIAGNOSIS — R091 Pleurisy: Secondary | ICD-10-CM | POA: Diagnosis not present

## 2017-06-13 DIAGNOSIS — I5033 Acute on chronic diastolic (congestive) heart failure: Secondary | ICD-10-CM | POA: Diagnosis not present

## 2017-06-13 DIAGNOSIS — J189 Pneumonia, unspecified organism: Secondary | ICD-10-CM

## 2017-06-13 DIAGNOSIS — I878 Other specified disorders of veins: Secondary | ICD-10-CM | POA: Diagnosis not present

## 2017-06-13 HISTORY — PX: IR THORACENTESIS ASP PLEURAL SPACE W/IMG GUIDE: IMG5380

## 2017-06-13 LAB — BODY FLUID CELL COUNT WITH DIFFERENTIAL
EOS FL: NONE SEEN %
Lymphs, Fluid: 19 %
MONOCYTE-MACROPHAGE-SEROUS FLUID: 8 % — AB (ref 50–90)
Neutrophil Count, Fluid: 73 % — ABNORMAL HIGH (ref 0–25)
Total Nucleated Cell Count, Fluid: 4226 cu mm — ABNORMAL HIGH (ref 0–1000)

## 2017-06-13 LAB — LACTATE DEHYDROGENASE, PLEURAL OR PERITONEAL FLUID: LD, Fluid: 273 U/L — ABNORMAL HIGH (ref 3–23)

## 2017-06-13 LAB — GLUCOSE, PLEURAL OR PERITONEAL FLUID: GLUCOSE FL: 101 mg/dL

## 2017-06-13 LAB — ALBUMIN, PLEURAL OR PERITONEAL FLUID: Albumin, Fluid: 2.3 g/dL

## 2017-06-13 LAB — PROTEIN, PLEURAL OR PERITONEAL FLUID: Total protein, fluid: 3.6 g/dL

## 2017-06-13 MED ORDER — MAGNESIUM OXIDE -MG SUPPLEMENT 200 MG PO TABS: 200.0000 mg | ORAL_TABLET | Freq: Two times a day (BID) | ORAL | 3 refills | Status: DC

## 2017-06-13 MED ORDER — FUROSEMIDE 20 MG PO TABS: 20.0000 mg | ORAL_TABLET | Freq: Every day | ORAL | 3 refills | Status: DC

## 2017-06-13 MED ORDER — LIDOCAINE HCL (PF) 2 % IJ SOLN
INTRAMUSCULAR | Status: AC
  Administered 2017-06-13: 10 mL
  Filled 2017-06-13: qty 20

## 2017-06-13 NOTE — Telephone Encounter (Signed)
Called and spoke with [facility] [place]. She wanted to let SG know that they were able to draw 600 cc off of the patient today. Sending to SG as FYI.

## 2017-06-13 NOTE — Patient Instructions (Addendum)
Medication Instructions:   STOP TAKING LOSARTAN NOW  START TAKING LASIX 20 MG ONCE DAILY  START TAKING MAGNESIUM OXIDE 200 MG TWICE DAILY    Labwork:  IN 2 WEEKS, SAME DAY AS YOUR SEE THE PA-C--TO CHECK---BMET AND PRO-BNP     Follow-Up:  2 WEEKS WITH AN EXTENDER--PLEASE HAVE YOUR LABS DONE THE SAME DAY AS THIS APPOINTMENT       If you need a refill on your cardiac medications before your next appointment, please call your pharmacy.

## 2017-06-13 NOTE — Procedures (Signed)
PROCEDURE SUMMARY:  Successful US guided diagnostic and therapeutic left thoracentesis. Yielded 600 mL of amber fluid. Pt tolerated procedure fair due to back spasms before and after procedure.  Stopped prior to removal of all fluid due to patient discomfort. No immediate complications.  Specimen was sent for labs. CXR ordered.  Docia Barrier PA-C 06/13/2017 1:50 PM

## 2017-06-13 NOTE — Progress Notes (Addendum)
Cardiology Office Note:    Date:  06/15/2017   ID:  [name], DOB [birth year], MRN [id number]  PCP:  [name], MD  Cardiologist:  Ena Dawley, MD  Electrophysiologist: Dr. Cristopher [name]   Referring MD: [name],*   No chief complaint on file.   History of Present Illness:    [name] is a 84 y.o. female with paroxysmal atrial fibrillation, diastolic heart failure, sinus node dysfunction s/p pacemaker, mild pulmonary HTN, hyperlipidemia, GERD, CKD.  She also has a hx of palpitations and PVCs as well as dizziness.  She saw Michelle Jester, PA-C in 02/2017.  She had TW inversions on her ECG and a follow up echocardiogram demonstrated normal LV function.  Her pacemaker has demonstrated episodes of atrial fibrillation but the patient is not felt to be an ideal candidate for anticoagulation unless there is more significant atrial fibrillation noted.  Last seen by Dr. Lovena [name] in March 2019.  She was admitted 4/5-4/8 with chest pain.  Cardiac enzymes were negative.  Patient did develop hypoxia with ambulation.  D-dimer was elevated.  VQ scan was low probability for pulmonary embolism.  05/18/17 - seen by Richardson [name], returns for further evaluation of chest pain, shortness of breath.  She is here with her daughter's friend Nurse, learning disability - prior RN with cardiac surgery).  She started to develop chest discomfort while her son (who has special needs) was in the hospital undergoing PCI.  She was sitting in the same position for several hours.  Prior to this, she was able to walk down the hall with a cane and no issues.  After her chest discomfort started she has had difficulty with shortness of breath.  Her pain was constant for hours prior to being admitted.  She denies associated nausea or diaphoresis or jaw pain.  She does have worsening pain with lying supine as well as pleuritic symptoms.  She has also had a cough with yellowish sputum.  She had a fever at one  point during her hospitalization.  She is now on oxygen.  She sees pulmonology in 2 days.  Her pain is better than it was when she went to the hospital.  She still has intermittent pain that seems to occur with positional changes.  06/13/17 - 1 month follow up, she is post thoracentesis for SOB and pleural effusion, improvement of symptoms, still some SOB and severe back muscle cramping, denies orthopnea, PND, [name] edema.  Prior CV studies:   The following studies were reviewed today:  VQ scan 05/16/17 IMPRESSION: Low probability for pulmonary embolism.  Venous duplex 05/17/17 IMPRESSION: No significant femoropopliteal DVT in either extremity. Limited assessment of the calf veins.  Echo 03/08/17 Moderate LVH, EF 35-36, grade 1 diastolic dysfunction, MAC, severe LAE  Event monitor 04/07/16 Atrial fibrillation with slow ventricular response, but no significant pauses. She is on no AV nodal blocking agent.  Carotid US 09/27/14 IMPRESSION: 1. Mild bilateral carotid bifurcation and proximal ICA plaque, resulting in less than 50% diameter stenosis. The exam does not exclude plaque ulceration or embolization. Continued surveillance Recommended.  48-hour Holter 8/16  Sinus bradycardia to sinus tachycardia, profound sinus arrhythmia and increased vagal tone with episodes of sinus bradycardia dwon to 43 BPM but no pause longer then 2 seconds.  Very short episodes of atrial tachycardia.  Nuclear stress test 09/14/13 Low risk stress nuclear study with apical attenuation artifact, worse at rest than stress. No reversible ischemia.  LV Ejection Fraction: 69%.  LV Wall Motion:  Normal Wall Motion  Echo 08/24/13 Mild LVH, EF 41-74, grade 1 diastolic dysfunction, mild MR, severe LAE  Echo 03/12/13 EF 55-60, PASP 35  Past Medical History:  Diagnosis Date  . Anemia    "I stay anemic"  . Arthritis   . Asthma    no problems recent  . Cataract   . Chronic diastolic CHF (congestive heart failure),  NYHA class 2 ([place]) 03/19/2014   Echo 4/19: mod LVH, EF 55-60, no RWMA, Gr 1 DD, MAC, trivial MR, mod LAE, normal RVSF, PASP 19  . CKD (chronic kidney disease), stage III ([place])   . Depression   . Diverticulitis   . DVT (deep venous thrombosis) ([place])    from birth control, left groin  . Essential hypertension   . Fibromyalgia 10 y.a.  . GERD (gastroesophageal reflux disease)   . Gout   . Hx of cardiovascular stress test    Lexiscan Myoview (8/15): apical attenuation artifact, no ischemia, EF 69% - low risk.  Marland Kitchen Hypercholesteremia   . Odynophagia   . Other abnormal glucose   . PAF (paroxysmal atrial fibrillation) ([place])   . Personal history of other diseases of digestive system   . Pneumonia    hx of  . Sinus bradycardia   . Skin cancer 2014   skin R elbow & face  . Symptomatic bradycardia 06/2016   Surgical Hx: The patient  has a past surgical history that includes Laryngoscopy / bronchoscopy / esophagoscopy (2010); Laminectomy (05/2007); Cholecystectomy (10/2005); Colonoscopy w/ biopsies (2005, 2011); Skin cancer excision (Right, ~2005); Tubal ligation; Appendectomy (~55years ago); Abdominal hysterectomy; Tumor removal (Right, ~ 20 years ago); Knee arthroscopy (Right); Total knee arthroplasty (Left, 04/04/2013); PACEMAKER IMPLANT (N/A, 07/01/2016); and IR THORACENTESIS ASP PLEURAL SPACE W/IMG GUIDE (06/13/2017).   Current Medications: Current Meds  Medication Sig  . acetaminophen (TYLENOL) 500 MG tablet Take 2 tablets (1,000 mg total) by mouth every 8 (eight) hours as needed for mild pain or headache.  . albuterol (PROVENTIL HFA;VENTOLIN HFA) 108 (90 BASE) MCG/ACT inhaler Inhale 1 puff into the lungs every 6 (six) hours as needed for wheezing or shortness of breath.  Marland Kitchen alendronate (FOSAMAX) 70 MG tablet Take 70 mg by mouth once a week. On Friday  . allopurinol (ZYLOPRIM) 100 MG tablet Take 1 tablet (100 mg total) by mouth daily.  Marland Kitchen aspirin EC 81 MG tablet Take 81 mg by mouth daily.   .  budesonide-formoterol (SYMBICORT) 80-4.5 MCG/ACT inhaler Inhale 2 puffs into the lungs 2 (two) times daily.  . cholecalciferol (VITAMIN D) 1000 units tablet Take 1,000 Units by mouth daily.   . colchicine 0.6 MG tablet Take 1 tablet by mouth every other day.  . Ferrous Sulfate 28 MG TABS Take 28 mg by mouth daily.   . montelukast (SINGULAIR) 10 MG tablet Take 10 mg by mouth at bedtime.    . pravastatin (PRAVACHOL) 20 MG tablet Take 20 mg by mouth at bedtime.   . ranitidine (ZANTAC) 150 MG tablet Take 1 tablet by mouth at bedtime.  . sertraline (ZOLOFT) 50 MG tablet Take 50 mg by mouth daily.   . traMADol (ULTRAM) 50 MG tablet Take 1 tablet (50 mg total) by mouth every 12 (twelve) hours as needed for moderate pain.  . [DISCONTINUED] losartan (COZAAR) 25 MG tablet TAKE 1 TABLET (25 MG TOTAL) BY MOUTH DAILY.     Allergies:   Penicillins; Amoxicillin; Diltiazem hcl; Sulfonamide derivatives; and Zetia [ezetimibe]   Social History   Tobacco Use  .  Smoking status: Former Smoker    Years: 10.00    Types: Cigarettes    Last attempt to quit: 02/08/1973    Years since quitting: 44.3  . Smokeless tobacco: Never Used  Substance Use Topics  . Alcohol use: No  . Drug use: No     Family Hx: The patient's family history includes Arrhythmia in her mother; Asthma in her sister; Cancer in her brother and sister; Diabetes in her father and sister; Fainting in her mother; Heart attack in her brother; Heart disease in her mother; Hypertension in her father and mother; Stroke in her father; Sudden death in her brother.  ROS:   Please see the history of present illness.    Review of Systems  Constitution: Positive for decreased appetite, fever and malaise/fatigue.  HENT: Positive for hearing loss.   Eyes: Positive for visual disturbance.  Cardiovascular: Positive for chest pain and dyspnea on exertion.  Hematologic/Lymphatic: Bruises/bleeds easily.  Musculoskeletal: Positive for back pain and joint pain.   Gastrointestinal: Positive for abdominal pain.  Neurological: Positive for dizziness and loss of balance.  Psychiatric/Behavioral: Positive for depression.   All other systems reviewed and are negative.   EKGs/Labs/Other Test Reviewed:    EKG:  EKG is  ordered today.  The ekg ordered today demonstrates atrial paced, HR 60, left axis deviation, T wave inversions V3-V6, similar to prior tracings  Recent Labs: 01/16/2017: B Natriuretic Peptide 334.6 05/13/2017: Hemoglobin 10.3; Platelets 120 05/14/2017: ALT 17 05/18/2017: NT-Pro BNP 1,189 05/26/2017: BUN 27; Creatinine, Ser 1.04; Potassium 5.3; Sodium 141   Recent Lipid Panel Lab Results  Component Value Date/Time   CHOL  06/07/2008 04:05 AM    118        ATP III CLASSIFICATION:  <200     mg/dL   Desirable  200-239  mg/dL   Borderline High  >=240    mg/dL   High          TRIG 85 06/07/2008 04:05 AM   HDL 35 (L) 06/07/2008 04:05 AM   CHOLHDL 3.4 06/07/2008 04:05 AM   LDLCALC  06/07/2008 04:05 AM    66        Total Cholesterol/HDL:CHD Risk Coronary Heart Disease Risk Table                     Men   Women  1/2 Average Risk   3.4   3.3  Average Risk       5.0   4.4  2 X Average Risk   9.6   7.1  3 X Average Risk  23.4   11.0        Use the calculated Patient Ratio above and the CHD Risk Table to determine the patient's CHD Risk.        ATP III CLASSIFICATION (LDL):  <100     mg/dL   Optimal  100-129  mg/dL   Near or Above                    Optimal  130-159  mg/dL   Borderline  160-189  mg/dL   High  >190     mg/dL   Very High    Physical Exam:    VS:  BP 138/66   Pulse 61   Ht _0  (1.626 m)   Wt 164 lb (74.4 kg)   SpO2 98%   BMI 28.15 kg/m     Wt Readings from Last 3 Encounters:  06/13/17 164  lb (74.4 kg)  06/09/17 164 lb (74.4 kg)  05/20/17 165 lb 6.4 oz (75 kg)     Physical Exam  Constitutional: She is oriented to person, place, and time. She appears well-developed and well-nourished. No distress.    HENT:  Head: Normocephalic and atraumatic.  Neck: No JVD present.  Cardiovascular: Normal rate and regular rhythm. Exam reveals no gallop.  No murmur heard. Pulmonary/Chest: Effort normal. She has no rales.  Abdominal: Soft.  Musculoskeletal: She exhibits no edema.  Neurological: She is alert and oriented to person, place, and time.  Skin: Skin is warm and dry.    ASSESSMENT & PLAN:      #1.  Shortness of breath  - chest CT showed B/L pleural effusions, s/p thoracentesis, still persistent decreased breathing sounds at the lung bases, start lasix 20 mg po daily   #2.  Chest pain, unspecified type She had chest pain that went on for hours without relief.  Her cardiac enzymes remained negative.  Her symptoms of chest discomfort seem to be more related to positional changes.  No ischemic workup indicated at this time.  #3. Acute on chronic diastolic CHF (congestive heart failure) ([facility]) Start lasix 20 mg po daily  #4.  PAF (paroxysmal atrial fibrillation) ([place]) This has been noted on interrogation of her pacemaker.  She has not been felt to be an ideal candidate for anticoagulation.    #5.  CKD (chronic kidney disease), Crea improved to 1.04  #6.  Cardiac pacemaker in situ Continue follow-up with Dr Lovena [name]   Dispo:  Follow up in 3 months.  Medication Adjustments/Labs and Tests Ordered: Current medicines are reviewed at length with the patient today.  Concerns regarding medicines are outlined above.  Tests Ordered: Orders Placed This Encounter  Procedures  . Basic Metabolic Panel (BMET)  . Pro b natriuretic peptide   Medication Changes: Meds ordered this encounter  Medications  . furosemide (LASIX) 20 MG tablet    Sig: Take 1 tablet (20 mg total) by mouth daily.    Dispense:  30 tablet    Refill:  3  . Magnesium Oxide 200 MG TABS    Sig: Take 1 tablet (200 mg total) by mouth 2 (two) times daily.    Dispense:  60 tablet    Refill:  3    Signed, Ena Dawley, MD   06/15/2017 2:03 PM    [address], [place], [place]  [address] Phone: [phone]; Fax: [phone]

## 2017-06-14 ENCOUNTER — Other Ambulatory Visit: Payer: Medicare HMO

## 2017-06-14 DIAGNOSIS — J471 Bronchiectasis with (acute) exacerbation: Secondary | ICD-10-CM | POA: Diagnosis not present

## 2017-06-14 DIAGNOSIS — I5032 Chronic diastolic (congestive) heart failure: Secondary | ICD-10-CM | POA: Diagnosis not present

## 2017-06-14 DIAGNOSIS — Z96651 Presence of right artificial knee joint: Secondary | ICD-10-CM | POA: Diagnosis not present

## 2017-06-15 DIAGNOSIS — I5032 Chronic diastolic (congestive) heart failure: Secondary | ICD-10-CM | POA: Diagnosis not present

## 2017-06-15 DIAGNOSIS — Z96651 Presence of right artificial knee joint: Secondary | ICD-10-CM | POA: Diagnosis not present

## 2017-06-16 ENCOUNTER — Telehealth: Payer: Self-pay | Admitting: Pulmonary Disease

## 2017-06-16 DIAGNOSIS — I4891 Unspecified atrial fibrillation: Secondary | ICD-10-CM | POA: Diagnosis not present

## 2017-06-16 DIAGNOSIS — I5032 Chronic diastolic (congestive) heart failure: Secondary | ICD-10-CM | POA: Diagnosis not present

## 2017-06-16 DIAGNOSIS — I129 Hypertensive chronic kidney disease with stage 1 through stage 4 chronic kidney disease, or unspecified chronic kidney disease: Secondary | ICD-10-CM | POA: Diagnosis not present

## 2017-06-16 DIAGNOSIS — I10 Essential (primary) hypertension: Secondary | ICD-10-CM | POA: Diagnosis not present

## 2017-06-16 DIAGNOSIS — J411 Mucopurulent chronic bronchitis: Secondary | ICD-10-CM | POA: Diagnosis not present

## 2017-06-16 DIAGNOSIS — M4854XS Collapsed vertebra, not elsewhere classified, thoracic region, sequela of fracture: Secondary | ICD-10-CM | POA: Diagnosis not present

## 2017-06-16 LAB — BODY FLUID CULTURE: CULTURE: NO GROWTH

## 2017-06-16 NOTE — Telephone Encounter (Signed)
Called [facility], spoke with Fraser Din Verified fax number(s) >> [phone] and [phone]  Last ov note, HRCT, Thoracentesis report printed and faxed to both numbers Labs not printed because results are not finalized  Nothing further needed; will sign off

## 2017-06-20 ENCOUNTER — Other Ambulatory Visit: Payer: Self-pay | Admitting: Internal Medicine

## 2017-06-28 DIAGNOSIS — R69 Illness, unspecified: Secondary | ICD-10-CM | POA: Diagnosis not present

## 2017-07-06 ENCOUNTER — Ambulatory Visit (INDEPENDENT_AMBULATORY_CARE_PROVIDER_SITE_OTHER): Payer: Medicare HMO | Admitting: *Deleted

## 2017-07-06 ENCOUNTER — Encounter: Payer: Self-pay | Admitting: Cardiology

## 2017-07-06 ENCOUNTER — Other Ambulatory Visit: Payer: Medicare HMO | Admitting: *Deleted

## 2017-07-06 ENCOUNTER — Ambulatory Visit (INDEPENDENT_AMBULATORY_CARE_PROVIDER_SITE_OTHER): Payer: Medicare HMO | Admitting: Cardiology

## 2017-07-06 VITALS — BP 128/78 | HR 60 | Ht 64.0 in | Wt 161.6 lb

## 2017-07-06 DIAGNOSIS — I313 Pericardial effusion (noninflammatory): Secondary | ICD-10-CM | POA: Diagnosis not present

## 2017-07-06 DIAGNOSIS — N183 Chronic kidney disease, stage 3 unspecified: Secondary | ICD-10-CM

## 2017-07-06 DIAGNOSIS — Z950 Presence of cardiac pacemaker: Secondary | ICD-10-CM

## 2017-07-06 DIAGNOSIS — I3139 Other pericardial effusion (noninflammatory): Secondary | ICD-10-CM

## 2017-07-06 DIAGNOSIS — R0602 Shortness of breath: Secondary | ICD-10-CM | POA: Diagnosis not present

## 2017-07-06 DIAGNOSIS — E875 Hyperkalemia: Secondary | ICD-10-CM

## 2017-07-06 DIAGNOSIS — I480 Paroxysmal atrial fibrillation: Secondary | ICD-10-CM | POA: Diagnosis not present

## 2017-07-06 DIAGNOSIS — J90 Pleural effusion, not elsewhere classified: Secondary | ICD-10-CM

## 2017-07-06 DIAGNOSIS — R001 Bradycardia, unspecified: Secondary | ICD-10-CM | POA: Diagnosis not present

## 2017-07-06 DIAGNOSIS — I5032 Chronic diastolic (congestive) heart failure: Secondary | ICD-10-CM

## 2017-07-06 DIAGNOSIS — I5033 Acute on chronic diastolic (congestive) heart failure: Secondary | ICD-10-CM

## 2017-07-06 LAB — BASIC METABOLIC PANEL
BUN/Creatinine Ratio: 19 (ref 12–28)
BUN: 24 mg/dL (ref 8–27)
CO2: 25 mmol/L (ref 20–29)
Calcium: 9.9 mg/dL (ref 8.7–10.3)
Chloride: 101 mmol/L (ref 96–106)
Creatinine, Ser: 1.28 mg/dL — ABNORMAL HIGH (ref 0.57–1.00)
GFR calc Af Amer: 43 mL/min/{1.73_m2} — ABNORMAL LOW (ref >59)
GFR calc non Af Amer: 37 mL/min/{1.73_m2} — ABNORMAL LOW (ref >59)
Glucose: 92 mg/dL (ref 65–99)
Potassium: 5.2 mmol/L (ref 3.5–5.2)
Sodium: 140 mmol/L (ref 134–144)

## 2017-07-06 LAB — PRO B NATRIURETIC PEPTIDE: NT-Pro BNP: 1456 pg/mL — ABNORMAL HIGH (ref 0–738)

## 2017-07-06 NOTE — Progress Notes (Signed)
Remote pacemaker transmission.   

## 2017-07-06 NOTE — Patient Instructions (Signed)
Medication Instructions:  Your physician recommends that you continue on your current medications as directed. Please refer to the Current Medication list given to you today.  Labwork: Your physician recommends that you have lab work today- BMET   Testing/Procedures: NONE  Follow-Up: Your physician wants you to follow-up in: 3 months with Dr. Meda Coffee.    If you need a refill on your cardiac medications before your next appointment, please call your pharmacy.

## 2017-07-06 NOTE — Progress Notes (Signed)
Cardiology Office Note:    Date:  07/07/2017   ID:  [name], DOB 1927/08/01, MRN [id number]  PCP:  [name], MD  Cardiologist:  Ena Dawley, MD  Referring MD: [name],*   Chief Complaint  Patient presents with  . Follow-up    CHF    History of Present Illness:    [name] is a 84 y.o. female with a past medical history significant for paroxysmal atrial fibrillation, diastolic heart failure, sinus node dysfunction S/P pacemaker, mild pulmonary hypertension, hyperlipidemia, GERD, CKD palpitations/PVCs with dizziness.  The patient has been deemed a poor candidate for anticoagulation for her atrial fibrillation unless she develops significant time in A. Fib.  According to her pacemaker she has only had brief episodes.  She was admitted to the hospital 4/5-05/16/17 for evaluation of atypical chest pain.  Her troponins were negative.  She was hypoxic she had a positive d-dimer but VQ scan showed low probability of PE. She has been followed by pulmonology since that time with addition of inhalers and Singulair.  She is on she was seen in the office on 05/18/2017 for further evaluation of chest pain and shortness of breath.  She underwent thoracentesis 06/13/17 with removal of 600 ml fluid. It was stopped due to increased pain..  Office on 06/13/2017 by Dr. Meda Coffee at which time the patient was noted to be status post thoracentesis but with still persistent decreased breath sounds in the lung bases.  She was started on Lasix 20 mg daily.  She continued to have chest discomfort was more related to position changes.  Pt had PNA in December and pulmonary feels that her pulmonary issues may be related to PNA that did not completely resolve.   [name] is here today with her friend Shawna Orleans, who is a retired cardiothoracic surgery nurse, for follow up. She reports that her breathing is better and she is working on weaning the oxygen. She is currently on 2L  with activity and she is able to be off at rest with O2 sats 96-97%. She still has pleuritic chest pain with deep breaths, much better than it had been. She has a history of dizziness which got better though April and was gone in beginning of May. The dizziness became worse again last week. She increased her fluid intake with improvement. This morning the dizziness has returned. It is worse when she leans over and comes up.   She is now eating better with improved appetite.   Past Medical History:  Diagnosis Date  . Anemia    "I stay anemic"  . Arthritis   . Asthma    no problems recent  . Cataract   . Chronic diastolic CHF (congestive heart failure), NYHA class 2 ([place]) 03/19/2014   Echo 4/19: mod LVH, EF 55-60, no RWMA, Gr 1 DD, MAC, trivial MR, mod LAE, normal RVSF, PASP 19  . CKD (chronic kidney disease), stage III ([place])   . Depression   . Diverticulitis   . DVT (deep venous thrombosis) ([place])    from birth control, left groin  . Essential hypertension   . Fibromyalgia 10 y.a.  . GERD (gastroesophageal reflux disease)   . Gout   . Hx of cardiovascular stress test    Lexiscan Myoview (8/15): apical attenuation artifact, no ischemia, EF 69% - low risk.  Marland Kitchen Hypercholesteremia   . Odynophagia   . Other abnormal glucose   . PAF (paroxysmal atrial fibrillation) ([place])   . Personal history of  other diseases of digestive system   . Pneumonia    hx of  . Sinus bradycardia   . Skin cancer 2014   skin R elbow & face  . Symptomatic bradycardia 06/2016    Past Surgical History:  Procedure Laterality Date  . ABDOMINAL HYSTERECTOMY     partial  . APPENDECTOMY  ~55years ago  . CHOLECYSTECTOMY  10/2005   Dr. Effie Shy  . COLONOSCOPY W/ BIOPSIES  2005, 2011   Dr. Bing Plume, "balloon inside for last one at [facility]"  . IR THORACENTESIS ASP PLEURAL SPACE W/IMG GUIDE  06/13/2017  . KNEE ARTHROSCOPY Right   . LAMINECTOMY  05/2007   foraminotomy, L4-5 laminectomy  . LARYNGOSCOPY /  BRONCHOSCOPY / ESOPHAGOSCOPY  2010   Dr. Benjamine Mola  . PACEMAKER IMPLANT N/A 07/01/2016   Procedure: Pacemaker Implant;  Surgeon: Evans Lance, MD;  Location: [facility];  Service: Cardiovascular;  Laterality: N/A;  . SKIN CANCER EXCISION Right ~2005   r elbow  . TOTAL KNEE ARTHROPLASTY Left 04/04/2013   Procedure: LEFT TOTAL KNEE ARTHROPLASTY;  Surgeon: Tobi Bastos, MD;  Location: [facility];  Service: Orthopedics;  Laterality: Left;  . TUBAL LIGATION    . TUMOR REMOVAL Right ~ 20 years ago   tumor in between toes    Current Medications: Current Meds  Medication Sig  . acetaminophen (TYLENOL) 500 MG tablet Take 2 tablets (1,000 mg total) by mouth every 8 (eight) hours as needed for mild pain or headache.  . albuterol (PROVENTIL HFA;VENTOLIN HFA) 108 (90 BASE) MCG/ACT inhaler Inhale 1 puff into the lungs every 6 (six) hours as needed for wheezing or shortness of breath.  Marland Kitchen alendronate (FOSAMAX) 70 MG tablet Take 70 mg by mouth once a week. On Friday  . allopurinol (ZYLOPRIM) 100 MG tablet Take 1 tablet (100 mg total) by mouth daily.  Marland Kitchen aspirin EC 81 MG tablet Take 81 mg by mouth daily.   . budesonide-formoterol (SYMBICORT) 80-4.5 MCG/ACT inhaler Inhale 2 puffs into the lungs 2 (two) times daily.  . cholecalciferol (VITAMIN D) 1000 units tablet Take 1,000 Units by mouth daily.   . colchicine 0.6 MG tablet Take 1 tablet by mouth every other day.  . Ferrous Sulfate 28 MG TABS Take 28 mg by mouth daily.   . furosemide (LASIX) 20 MG tablet Take 1 tablet (20 mg total) by mouth daily.  . Magnesium Oxide 200 MG TABS Take 1 tablet (200 mg total) by mouth 2 (two) times daily.  . montelukast (SINGULAIR) 10 MG tablet Take 10 mg by mouth at bedtime.    . pravastatin (PRAVACHOL) 20 MG tablet Take 20 mg by mouth at bedtime.   . ranitidine (ZANTAC) 150 MG tablet Take 1 tablet by mouth at bedtime.  . sertraline (ZOLOFT) 50 MG tablet Take 50 mg by mouth daily.   . traMADol (ULTRAM) 50 MG tablet Take  1 tablet (50 mg total) by mouth every 12 (twelve) hours as needed for moderate pain.     Allergies:   Penicillins; Amoxicillin; Diltiazem hcl; Sulfonamide derivatives; and Zetia [ezetimibe]   Social History   Socioeconomic History  . Marital status: Widowed    Spouse name: Not on file  . Number of children: Not on file  . Years of education: Not on file  . Highest education level: Not on file  Occupational History  . Not on file  Social Needs  . Financial resource strain: Not on file  . Food insecurity:  Worry: Not on file    Inability: Not on file  . Transportation needs:    Medical: Not on file    Non-medical: Not on file  Tobacco Use  . Smoking status: Former Smoker    Years: 10.00    Types: Cigarettes    Last attempt to quit: 02/08/1973    Years since quitting: 44.4  . Smokeless tobacco: Never Used  Substance and Sexual Activity  . Alcohol use: No  . Drug use: No  . Sexual activity: Never  Lifestyle  . Physical activity:    Days per week: Not on file    Minutes per session: Not on file  . Stress: Not on file  Relationships  . Social connections:    Talks on phone: Not on file    Gets together: Not on file    Attends religious service: Not on file    Active member of club or organization: Not on file    Attends meetings of clubs or organizations: Not on file    Relationship status: Not on file  Other Topics Concern  . Not on file  Social History Narrative  . Not on file     Family History: The patient's family history includes Arrhythmia in her mother; Asthma in her sister; Cancer in her brother and sister; Diabetes in her father and sister; Fainting in her mother; Heart attack in her brother; Heart disease in her mother; Hypertension in her father and mother; Stroke in her father; Sudden death in her brother. ROS:   Please see the history of present illness.     All other systems reviewed and are negative.  EKGs/Labs/Other Studies Reviewed:    The  following studies were reviewed today:  Echocardiogram 05/26/2017 Study Conclusions - Left ventricle: The cavity size was normal. Wall thickness was   increased in a pattern of moderate LVH. Systolic function was   normal. The estimated ejection fraction was in the range of 55%   to 60%. Wall motion was normal; there were no regional wall   motion abnormalities. Doppler parameters are consistent with   abnormal left ventricular relaxation (grade 1 diastolic   dysfunction). - Aortic valve: There was no stenosis. - Mitral valve: Mildly calcified annulus. There was trivial   regurgitation. - Left atrium: The atrium was moderately dilated. - Right ventricle: The cavity size was normal. Pacer wire or   catheter noted in right ventricle. Systolic function was normal. - Tricuspid valve: Peak RV-RA gradient (S): 16 mm Hg. - Pulmonary arteries: PA peak pressure: 19 mm Hg (S). - Inferior vena cava: The vessel was normal in size. The   respirophasic diameter changes were in the normal range (>= 50%),   consistent with normal central venous pressure.  Impressions: - Normal LV size with moderate LV hypertrophy. EF 55-60%. Normal RV   size and systolic function. No significant valvular   abnormalities.   EKG:  EKG is not ordered today.   Recent Labs: 01/16/2017: B Natriuretic Peptide 334.6 05/13/2017: Hemoglobin 10.3; Platelets 120 05/14/2017: ALT 17 07/06/2017: BUN 24; Creatinine, Ser 1.28; NT-Pro BNP 1,456; Potassium 5.2; Sodium 140   Recent Lipid Panel    Component Value Date/Time   CHOL  06/07/2008 0405    118        ATP III CLASSIFICATION:  <200     mg/dL   Desirable  200-239  mg/dL   Borderline High  >=240    mg/dL   High  TRIG 85 06/07/2008 0405   HDL 35 (L) 06/07/2008 0405   CHOLHDL 3.4 06/07/2008 0405   VLDL 17 06/07/2008 0405   LDLCALC  06/07/2008 0405    66        Total Cholesterol/HDL:CHD Risk Coronary Heart Disease Risk Table                     Men   Women   1/2 Average Risk   3.4   3.3  Average Risk       5.0   4.4  2 X Average Risk   9.6   7.1  3 X Average Risk  23.4   11.0        Use the calculated Patient Ratio above and the CHD Risk Table to determine the patient's CHD Risk.        ATP III CLASSIFICATION (LDL):  <100     mg/dL   Optimal  100-129  mg/dL   Near or Above                    Optimal  130-159  mg/dL   Borderline  160-189  mg/dL   High  >190     mg/dL   Very High    Physical Exam:    VS:  BP 128/78   Pulse 60   Ht _0  (1.626 m)   Wt 161 lb 9.6 oz (73.3 kg)   SpO2 96%   BMI 27.74 kg/m     Wt Readings from Last 3 Encounters:  07/06/17 161 lb 9.6 oz (73.3 kg)  06/13/17 164 lb (74.4 kg)  06/09/17 164 lb (74.4 kg)     Physical Exam  Constitutional: She is oriented to person, place, and time. No distress.  Frail, elderly female  HENT:  Head: Normocephalic and atraumatic.  Neck: Normal range of motion. Neck supple. No JVD present.  Cardiovascular: Normal rate, regular rhythm, normal heart sounds and intact distal pulses. Exam reveals no gallop and no friction rub.  No murmur heard. Pulmonary/Chest: Effort normal and breath sounds normal. No respiratory distress. She has no wheezes. She has no rales.  Abdominal: Soft. Bowel sounds are normal.  Musculoskeletal: Normal range of motion. She exhibits no edema.  Kyphosis  Neurological: She is alert and oriented to person, place, and time.  Skin: Skin is warm and dry.  Psychiatric: She has a normal mood and affect. Her behavior is normal. Thought content normal.  Vitals reviewed.   ASSESSMENT:    1. Shortness of breath   2. Chronic diastolic CHF (congestive heart failure), NYHA class 2 ([place])   3. PAF (paroxysmal atrial fibrillation) ([place])   4. CKD (chronic kidney disease), stage III ([place])   5. Pacemaker    PLAN:    In order of problems listed above:   Shortness of breath: Patient is status post thoracentesis, now using oxygen therapy and followed by  pulmonology. She reports improved breathing and her O2 sats are improving, She is weaning the oxygen.  Chest pain: More pleuritic in nature, worse with deep breathing.  This has been improving. The patient has ruled out for myocardial ischemia during hospital admission in April no ischemic work-up indicated at this time.   Acute on chronic diastolic CHF: Lasix 20 mg daily was initiated on 06/13/2017. Wt is down 3 lbs. Her breathing has improved and she has no edema. Pt with increased dizziness and her friend, a Marine scientist, feels that she is dry. Will stop  lasix. She has follow up with pulmonary for pleural effusions next week. Reviewed to monitor wts, edema and breathing. Have low thresh hold to restart lasix.   Paroxysmal atrial fibrillation: Pacemaker interrogations show short periods of A. Fib. Patient has not been felt to be an ideal candidate for anticoagulation.  CKD: Serum creatinine was 1.04 with a potassium of 5.3 on 05/26/2017.  Will check a Bmet today.   Pacemaker: Followed by Dr. Lovena Le   Medication Adjustments/Labs and Tests Ordered: Current medicines are reviewed at length with the patient today.  Concerns regarding medicines are outlined above. Labs and tests ordered and medication changes are outlined in the patient instructions below:  Patient Instructions  Medication Instructions:  Your physician recommends that you continue on your current medications as directed. Please refer to the Current Medication list given to you today.  Labwork: Your physician recommends that you have lab work today- BMET   Testing/Procedures: NONE  Follow-Up: Your physician wants you to follow-up in: 3 months with Dr. Meda Coffee.    If you need a refill on your cardiac medications before your next appointment, please call your pharmacy.       Signed, Daune Perch, NP  07/07/2017 5:40 AM    [facility]

## 2017-07-11 ENCOUNTER — Encounter: Payer: Self-pay | Admitting: Acute Care

## 2017-07-11 ENCOUNTER — Telehealth: Payer: Self-pay

## 2017-07-11 ENCOUNTER — Ambulatory Visit (INDEPENDENT_AMBULATORY_CARE_PROVIDER_SITE_OTHER)
Admission: RE | Admit: 2017-07-11 | Discharge: 2017-07-11 | Disposition: A | Discharge status: Home / Self Care | Payer: Medicare HMO | Source: Ambulatory Visit | Attending: Acute Care | Admitting: Acute Care

## 2017-07-11 ENCOUNTER — Other Ambulatory Visit: Payer: Self-pay | Admitting: Cardiology

## 2017-07-11 ENCOUNTER — Ambulatory Visit: Payer: Medicare HMO | Admitting: Acute Care

## 2017-07-11 VITALS — BP 142/72 | HR 61 | Ht 64.0 in | Wt 162.4 lb

## 2017-07-11 DIAGNOSIS — J918 Pleural effusion in other conditions classified elsewhere: Secondary | ICD-10-CM

## 2017-07-11 DIAGNOSIS — J9611 Chronic respiratory failure with hypoxia: Secondary | ICD-10-CM

## 2017-07-11 DIAGNOSIS — J189 Pneumonia, unspecified organism: Secondary | ICD-10-CM | POA: Diagnosis not present

## 2017-07-11 DIAGNOSIS — R42 Dizziness and giddiness: Secondary | ICD-10-CM

## 2017-07-11 DIAGNOSIS — I110 Hypertensive heart disease with heart failure: Secondary | ICD-10-CM

## 2017-07-11 DIAGNOSIS — R079 Chest pain, unspecified: Secondary | ICD-10-CM | POA: Diagnosis not present

## 2017-07-11 DIAGNOSIS — R001 Bradycardia, unspecified: Secondary | ICD-10-CM

## 2017-07-11 DIAGNOSIS — I5032 Chronic diastolic (congestive) heart failure: Secondary | ICD-10-CM

## 2017-07-11 DIAGNOSIS — W19XXXA Unspecified fall, initial encounter: Secondary | ICD-10-CM

## 2017-07-11 DIAGNOSIS — E875 Hyperkalemia: Secondary | ICD-10-CM

## 2017-07-11 LAB — CUP PACEART REMOTE DEVICE CHECK
Battery Remaining Longevity: 108 mo
Battery Remaining Percentage: 100 %
Brady Statistic RV Percent Paced: 0 %
Implantable Lead Implant Date: 20180524
Implantable Lead Implant Date: 20180524
Implantable Lead Location: 753859
Implantable Lead Location: 753860
Implantable Lead Model: 7740
Implantable Lead Model: 7741
Lead Channel Impedance Value: 625 Ohm
Lead Channel Pacing Threshold Amplitude: 0.9 V
Lead Channel Pacing Threshold Pulse Width: 0.4 ms
Lead Channel Setting Pacing Amplitude: 2 V
Lead Channel Setting Pacing Amplitude: 2.4 V
Lead Channel Setting Pacing Pulse Width: 0.4 ms
MDC IDC LEAD SERIAL: 693708
MDC IDC LEAD SERIAL: 861246
MDC IDC MSMT LEADCHNL RA PACING THRESHOLD AMPLITUDE: 0.4 V
MDC IDC MSMT LEADCHNL RA PACING THRESHOLD PULSEWIDTH: 0.4 ms
MDC IDC MSMT LEADCHNL RV IMPEDANCE VALUE: 554 Ohm
MDC IDC PG IMPLANT DT: 20180524
MDC IDC PG SERIAL: 308044
MDC IDC SESS DTM: 20190528080200
MDC IDC SET LEADCHNL RV SENSING SENSITIVITY: 2 mV
MDC IDC STAT BRADY RA PERCENT PACED: 62 %

## 2017-07-11 NOTE — Progress Notes (Signed)
History of Present Illness [name] is a 84 y.o. female former smoker ( Quit 41) with bronchiectasis and history of  Asthma. She is followed by Dr. Halford [name].   Maintenance: Symbicort 80 Singulair 10 mg daily   07/11/2017 Follow up. Pt. Presents for follow up of pleural effusion . She had a thoracentesis 06/13/2017 with 600cc's of fluid removed. She started to spasm, and they had top stop the procedure, they felt they could have tapped more fluid if she had been able to tolerate it. She states she is doing better since. She coughed more after the procedure, but that has resolved. Secretions have been yellow up through last week. Now they are white. Bridgette, RN and family friend,  has started weaning her oxygen and she has maintained sats 94-95% on RA at rest. She is wearing 2 L [name] with exertion.Blood pressure meds were discontinued 3 weeks ago, and she was started on lasix 20 mg once daily with hoped of continued resolution of effusion. . BP was 130's/70. She had continued dizziness so that has been discontinued.She continues to have left sided pain with cough at night. Appetite has improved. Her potassium is at 5.2, and she has repeat labs in 2 weeks to follow creatinine and renal function.She is using her nebs once daily. She states they sometimes make her dizzy. ( May need to consider Xopenex) She states she is compliant with her Symbicort and Singulair. She denies fever, orthopnea or hemoptysis.  Test Results:  Pleural fluid LDH>> 273 Pleural fluid WBC>> 4,226 Pleural fluid lymphocytes>> 19% Pleural Fluid Neutrophils 73% Eosinophils>> None seen Glucose 101 Pleural Fluid Protein>> 3.6 g/dL Albumin 2.3 g/dL Suspect exudative ( Para pneumonic)  CXR 07/11/2017>>  IMPRESSION: Significantly decreased LEFT pleural effusion, now small. Cardiomegaly and mild bibasilar atelectasis/scarring.  HRCT Chest  06/03/2017 Suspect mild subpleural reticulation and traction bronchiolectasis, new  from 06/06/2008. Differential diagnosis includes nonspecific interstitial pneumonitis and usual interstitial pneumonitis. 2. Moderate, minimally loculated left pleural effusion with collapse/consolidation in the left lower lobe. Small right pleural effusion. 3. Small pericardial effusion. Difficult to exclude slight thickening of the pericardium, poorly evaluated without IV contrast. 4. Aortic atherosclerosis (ICD10-170.0). Coronary artery calcification. 5. Marked enlargement of the pulmonary arteries, consistent with pulmonary arterial hypertension. 6. Spleen appears prominent but is incompletely imaged.   Echo 05/26/2017  Left ventricle: The cavity size was normal. Wall thickness was increased in a pattern of moderate LVH. Systolic function was normal. The estimated ejection fraction was in the range of 55% to 60%. Wall motion was normal; there were no regional wall motion abnormalities. Doppler parameters are consistent with abnormal left ventricular relaxation (grade 1 diastolic dysfunction). - Aortic valve: There was no stenosis. - Mitral valve: Mildly calcified annulus. There was trivial regurgitation. - Left atrium: The atrium was moderately dilated. - Right ventricle: The cavity size was normal. Pacer wire or catheter noted in right ventricle. Systolic function was normal. - Tricuspid valve: Peak RV-RA gradient (S): 16 mm Hg. - Pulmonary arteries: PA peak pressure: 19 mm Hg (S). - Inferior vena cava: The vessel was normal in size. The respirophasic diameter changes were in the normal range (>= 50%), consistent with normal central venous pressure.        CXR 07/11/2017  CBC Latest Ref Rng & Units 05/13/2017 01/16/2017 01/15/2017  WBC 4.0 - 10.5 K/uL 7.8 10.3 6.0  Hemoglobin 12.0 - 15.0 g/dL 10.3(L) 9.7(L) 9.8(L)  Hematocrit 36.0 - 46.0 % 31.8(L) 29.8(L) 30.7(L)  Platelets 150 -  400 K/uL 120(L) 149(L) 118(L)    BMP Latest Ref Rng & Units  07/06/2017 05/26/2017 05/18/2017  Glucose 65 - 99 mg/dL 92 86 93  BUN 8 - 27 mg/dL 24 27 32(H)  Creatinine 0.57 - 1.00 mg/dL 1.28(H) 1.04(H) 1.18(H)  BUN/Creat Ratio 12 - 28 19 26 27   Sodium 134 - 144 mmol/L 140 141 139  Potassium 3.5 - 5.2 mmol/L 5.2 5.3(H) 5.3(H)  Chloride 96 - 106 mmol/L 101 106 101  CO2 20 - 29 mmol/L 25 22 24   Calcium 8.7 - 10.3 mg/dL 9.9 9.0 9.2    BNP    Component Value Date/Time   BNP 334.6 (H) 01/16/2017 0522    ProBNP    Component Value Date/Time   PROBNP 1,456 (H) 07/06/2017 1000   PROBNP 113.0 (H) 08/24/2013 0940    PFT    Component Value Date/Time   FEV1PRE 1.23 06/03/2017 1057   FEV1POST 1.15 06/03/2017 1057   FVCPRE 1.43 06/03/2017 1057   FVCPOST 1.40 06/03/2017 1057   DLCOUNC 9.36 06/03/2017 1057   PREFEV1FVCRT 86 06/03/2017 1057   PSTFEV1FVCRT 82 06/03/2017 1057    Dg Chest 1 View  Result Date: 06/13/2017 CLINICAL DATA:  Status post left thoracentesis. EXAM: CHEST  1 VIEW COMPARISON:  Chest x-ray May 18, 2017 FINDINGS: There is a moderate left-sided pleural effusion with underlying opacity. No pneumothorax after thoracentesis. Mild pulmonary venous congestion. No other changes. IMPRESSION: 1. A moderate left effusion with underlying opacity remains after thoracentesis. No pneumothorax. Mild pulmonary venous congestion. Electronically Signed   By: Dorise Bullion III M.D   On: 06/13/2017 14:02   Dg Chest 2 View  Result Date: 07/11/2017 CLINICAL DATA:  Followup pleural effusion. LEFT-sided chest pain. Chronic respiratory failure and hypoxia. EXAM: CHEST - 2 VIEW COMPARISON:  06/13/2017 and prior radiographs FINDINGS: This is a low volume film. Cardiomegaly and LEFT-sided pacemaker again noted. Significant decrease in LEFT pleural effusion noted, now small. Mild bibasilar atelectasis/scarring again noted. There is no evidence of pneumothorax. IMPRESSION: Significantly decreased LEFT pleural effusion, now small. Cardiomegaly and mild bibasilar  atelectasis/scarring. Electronically Signed   By: Margarette Canada M.D.   On: 07/11/2017 13:21   Ir Thoracentesis Asp Pleural Space W/img Guide  Result Date: 06/13/2017 INDICATION: Patient with shortness of breath, left pleural effusion. Request is made for diagnostic and therapeutic thoracentesis. EXAM: ULTRASOUND GUIDED DIAGNOSTIC AND THERAPEUTIC LEFT THORACENTESIS MEDICATIONS: 10 mL 2% lidocaine COMPLICATIONS: None immediate. PROCEDURE: An ultrasound guided thoracentesis was thoroughly discussed with the patient and questions answered. The benefits, risks, alternatives and complications were also discussed. The patient understands and wishes to proceed with the procedure. Written consent was obtained. Ultrasound was performed to localize and mark an adequate pocket of fluid in the left chest. The area was then prepped and draped in the normal sterile fashion. 2% lidocaine was used for local anesthesia. Under ultrasound guidance a Safe-T-Centesis catheter was introduced. Thoracentesis was performed. The catheter was removed and a dressing applied. FINDINGS: A total of approximately 600 mL of amber fluid was removed. Samples were sent to the laboratory as requested by the clinical team. IMPRESSION: Successful ultrasound guided left diagnostic and therapeutic thoracentesis yielding 600 mL of pleural fluid. Read by: Brynda Greathouse PA-C Electronically Signed   By: Sandi Mariscal M.D.   On: 06/13/2017 14:31     Past medical hx Past Medical History:  Diagnosis Date  . Anemia    "I stay anemic"  . Arthritis   . Asthma  no problems recent  . Cataract   . Chronic diastolic CHF (congestive heart failure), NYHA class 2 ([name]) 03/19/2014   Echo 4/19: mod LVH, EF 55-60, no RWMA, Gr 1 DD, MAC, trivial MR, mod LAE, normal RVSF, PASP 19  . CKD (chronic kidney disease), stage III ([place])   . Depression   . Diverticulitis   . DVT (deep venous thrombosis) ([place])    from birth control, left groin  . Essential  hypertension   . Fibromyalgia 10 y.a.  . GERD (gastroesophageal reflux disease)   . Gout   . Hx of cardiovascular stress test    Lexiscan Myoview (8/15): apical attenuation artifact, no ischemia, EF 69% - low risk.  Marland Kitchen Hypercholesteremia   . Odynophagia   . Other abnormal glucose   . PAF (paroxysmal atrial fibrillation) ([place])   . Personal history of other diseases of digestive system   . Pneumonia    hx of  . Sinus bradycardia   . Skin cancer 2014   skin R elbow & face  . Symptomatic bradycardia 06/2016     Social History   Tobacco Use  . Smoking status: Former Smoker    Years: 10.00    Types: Cigarettes    Last attempt to quit: 02/08/1973    Years since quitting: 44.4  . Smokeless tobacco: Never Used  Substance Use Topics  . Alcohol use: No  . Drug use: No    Ms.Sharman reports that she quit smoking about 44 years ago. Her smoking use included cigarettes. She quit after 10.00 years of use. She has never used smokeless tobacco. She reports that she does not drink alcohol or use drugs.  Tobacco Cessation: Former smoker , quit 1975  Past surgical hx, Family hx, Social hx all reviewed.  Current Outpatient Medications on File Prior to Visit  Medication Sig  . acetaminophen (TYLENOL) 500 MG tablet Take 2 tablets (1,000 mg total) by mouth every 8 (eight) hours as needed for mild pain or headache.  . albuterol (PROVENTIL HFA;VENTOLIN HFA) 108 (90 BASE) MCG/ACT inhaler Inhale 1 puff into the lungs every 6 (six) hours as needed for wheezing or shortness of breath.  Marland Kitchen alendronate (FOSAMAX) 70 MG tablet Take 70 mg by mouth once a week. On Friday  . allopurinol (ZYLOPRIM) 100 MG tablet Take 1 tablet (100 mg total) by mouth daily.  Marland Kitchen aspirin EC 81 MG tablet Take 81 mg by mouth daily.   . budesonide-formoterol (SYMBICORT) 80-4.5 MCG/ACT inhaler Inhale 2 puffs into the lungs 2 (two) times daily.  . cholecalciferol (VITAMIN D) 1000 units tablet Take 1,000 Units by mouth daily.   .  colchicine 0.6 MG tablet Take 1 tablet by mouth every other day.  . Ferrous Sulfate 28 MG TABS Take 28 mg by mouth daily.   . Magnesium Oxide 200 MG TABS Take 1 tablet (200 mg total) by mouth 2 (two) times daily.  . montelukast (SINGULAIR) 10 MG tablet Take 10 mg by mouth at bedtime.    . pravastatin (PRAVACHOL) 20 MG tablet Take 20 mg by mouth at bedtime.   . ranitidine (ZANTAC) 150 MG tablet Take 1 tablet by mouth at bedtime.  . sertraline (ZOLOFT) 50 MG tablet Take 50 mg by mouth daily.   . traMADol (ULTRAM) 50 MG tablet Take 1 tablet (50 mg total) by mouth every 12 (twelve) hours as needed for moderate pain.   No current facility-administered medications on file prior to visit.      Allergies  Allergen  Reactions  . Penicillins Rash  . Amoxicillin Other (See Comments)    Very weak Has patient had a PCN reaction causing immediate rash, facial/tongue/throat swelling, SOB or lightheadedness with hypotension: no Has patient had a PCN reaction causing severe rash involving mucus membranes or skin necrosis: no Has patient had a PCN reaction that required hospitalization: no Has patient had a PCN reaction occurring within the last 10 years: no If all of the above answers are "NO", then may proceed with Cephalosporin use.   . Diltiazem Hcl Other (See Comments)    Reaction unknown  . Sulfonamide Derivatives Other (See Comments)    Reaction unknown  . Zetia [Ezetimibe] Other (See Comments)    Weakness    Review Of Systems:  Constitutional:   No  weight loss, night sweats,  Fevers, chills, fatigue, or  lassitude.  HEENT:   No headaches,  Difficulty swallowing,  Tooth/dental problems, or  Sore throat,                No sneezing, itching, ear ache, nasal congestion, post nasal drip,   CV:  No chest pain,  Orthopnea, PND, swelling in lower extremities, anasarca, dizziness, palpitations, syncope.   GI  No heartburn, indigestion, abdominal pain, nausea, vomiting, diarrhea, change in bowel  habits, loss of appetite, bloody stools.   Resp: Improving  shortness of breath with exertion none at rest.  No excess mucus, no productive cough,  No non-productive cough,  No coughing up of blood.  No change in color of mucus.  No wheezing.  No chest wall deformity  Skin: no rash or lesions.  GU: no dysuria, change in color of urine, no urgency or frequency.  No flank pain, no hematuria   MS:  No joint pain or swelling.  No decreased range of motion.  No back pain.  Psych:  No change in mood or affect. No depression or anxiety.  No memory loss.   Vital Signs BP (!) 142/72 (BP Location: Right Arm, Cuff Size: Normal)   Pulse 61   Ht 5' 4"  (1.626 m)   Wt 162 lb 6.4 oz (73.7 kg)   SpO2 95%   BMI 27.88 kg/m    Physical Exam:  General- No distress,  A&Ox3, pleasant ENT: No sinus tenderness, TM clear, pale nasal mucosa, no oral exudate,no post nasal drip, no LAN Cardiac: S1, S2, regular rate and rhythm, no murmur Chest: No wheeze/ rales/ dullness; no accessory muscle use, no nasal flaring, no sternal retractions, slightly diminished L base Abd.: Soft Non-tender, NT, ND, BS +, Body mass index is 27.88 kg/m. Ext: No clubbing cyanosis, trace BLE edema L>R Neuro:  Deconditioned at baseline, MAE x 4, A&O x 3 Skin: No rashes, warm and dry Psych: normal mood and behavior   Assessment/Plan  Pleural effusion associated with pulmonary infection Resolving after thoracentesis Plan: CXR today to evaluate effusion. Symbicort 2 puffs twice daily every day. We will use albuterol nebs as needed for breakthrough shortness of breath.  Continue oxygen with exertion Maintain sats 88-94% Aggressive pulmonary Toilet( IS, Flutter) Increase activity slowly, increase mobility Do not eat bananas, raisins or orange juice/oranges, watermelon. Follow up with cardiology for labs Lasix per cardiology. Follow up with Dr. Halford [name], or Sarah NP in 2 months Please contact office for sooner follow up if  symptoms do not improve or worsen or seek emergency care   Chronic respiratory failure with hypoxia ([place]) Walked today. Did not desaturate Plan: Do not use oxygen unless saturations drop  below 88%    Magdalen Spatz, NP 07/11/2017  4:30 PM

## 2017-07-11 NOTE — Assessment & Plan Note (Signed)
Walked today. Did not desaturate Plan: Do not use oxygen unless saturations drop below 88%

## 2017-07-11 NOTE — Assessment & Plan Note (Signed)
Resolving after thoracentesis Plan: CXR today to evaluate effusion. Symbicort 2 puffs twice daily every day. We will use albuterol nebs as needed for breakthrough shortness of breath.  Continue oxygen with exertion Maintain sats 88-94% Aggressive pulmonary Toilet( IS, Flutter) Increase activity slowly, increase mobility Do not eat bananas, raisins or orange juice/oranges, watermelon. Follow up with cardiology for labs Lasix per cardiology. Follow up with Dr. Halford Chessman, or Darinda [place] NP in 2 months Please contact office for sooner follow up if symptoms do not improve or worsen or seek emergency care

## 2017-07-11 NOTE — Telephone Encounter (Signed)
Spoke with Bridget/ pt caregiver and advised her that pt can return in 2 weeks per Daune Perch for repeat BMET. Appointment 07/25/17 with lab.

## 2017-07-11 NOTE — Telephone Encounter (Signed)
-----   Message from Daune Perch, NP sent at 07/08/2017  6:06 PM EDT ----- Can recheck BMet in 2-3 weeks.   ----- Message ----- From: Drue Novel I, RN Sent: 07/08/2017   4:11 PM To: Daune Perch, NP  Per note below from Lelon Frohlich, RN:  "Will ask when pt needs to come back for repeat labs since next OV isn't for 3 months."   Please advise, thanks

## 2017-07-11 NOTE — Patient Instructions (Addendum)
We will walk you today to see how you are doing with your oxygen. CXR today to evaluate effusion. Symbicort 2 puffs twice daily every day. We will use albuterol nebs as needed for breakthrough shortness of breath.  Continue oxygen with exertion Maintain sats 88-94% Aggressive pulmonary Toilet( IS, Flutter) Increase activity slowly, increase mobility Do not eat bananas, raisins or orange juice/oranges, watermelon. Follow up with cardiology for labs Lasix per cardiology. Follow up with Dr. Halford Chessman, or Yusuke Beza NP in 2 months Please contact office for sooner follow up if symptoms do not improve or worsen or seek emergency care

## 2017-07-12 DIAGNOSIS — H5211 Myopia, right eye: Secondary | ICD-10-CM | POA: Diagnosis not present

## 2017-07-13 ENCOUNTER — Telehealth: Payer: Self-pay | Admitting: Acute Care

## 2017-07-13 NOTE — Telephone Encounter (Signed)
Significantly decreased LEFT pleural effusion, now small.  Cardiomegaly and mild bibasilar atelectasis/scarring.  Please let her know the effusion looks much better. Have her let us know if the notice any worsening in breathing as she is now off her lasix. Thanks so much

## 2017-07-13 NOTE — Telephone Encounter (Signed)
Pt's care giver, Shelton Silvas is requesting the pt's CXR results. Shelton Silvas is not listed on the pt's DPR.  Judson Roch - please advise on results so that we may call the pt.

## 2017-07-14 NOTE — Telephone Encounter (Signed)
Called both patient and patients daughter per DPR. Unable to reach left message to give [place] a call back.

## 2017-07-14 NOTE — Telephone Encounter (Signed)
[name] is returning call. Cb is [phone].

## 2017-07-14 NOTE — Telephone Encounter (Signed)
Called and spoke with [name]. DPR was signed on 4.10.19 for [name] to receive information regarding patients healthcare. Apologized to her and let her know of the results advised by SG.   [name] verbalized understanding. Nothing further needed.

## 2017-07-15 DIAGNOSIS — I5032 Chronic diastolic (congestive) heart failure: Secondary | ICD-10-CM | POA: Diagnosis not present

## 2017-07-15 DIAGNOSIS — Z96651 Presence of right artificial knee joint: Secondary | ICD-10-CM | POA: Diagnosis not present

## 2017-07-16 DIAGNOSIS — I5032 Chronic diastolic (congestive) heart failure: Secondary | ICD-10-CM | POA: Diagnosis not present

## 2017-07-16 DIAGNOSIS — Z96651 Presence of right artificial knee joint: Secondary | ICD-10-CM | POA: Diagnosis not present

## 2017-07-18 DIAGNOSIS — R69 Illness, unspecified: Secondary | ICD-10-CM | POA: Diagnosis not present

## 2017-07-25 ENCOUNTER — Other Ambulatory Visit: Payer: Self-pay | Admitting: Cardiology

## 2017-07-25 ENCOUNTER — Other Ambulatory Visit: Payer: Medicare HMO | Admitting: *Deleted

## 2017-07-25 DIAGNOSIS — E875 Hyperkalemia: Secondary | ICD-10-CM | POA: Diagnosis not present

## 2017-07-25 DIAGNOSIS — I5032 Chronic diastolic (congestive) heart failure: Secondary | ICD-10-CM | POA: Diagnosis not present

## 2017-07-26 LAB — BASIC METABOLIC PANEL
BUN / CREAT RATIO: 18 (ref 12–28)
BUN: 30 mg/dL — ABNORMAL HIGH (ref 8–27)
CO2: 21 mmol/L (ref 20–29)
CREATININE: 1.71 mg/dL — AB (ref 0.57–1.00)
Calcium: 9.4 mg/dL (ref 8.7–10.3)
Chloride: 106 mmol/L (ref 96–106)
GFR calc Af Amer: 30 mL/min/{1.73_m2} — ABNORMAL LOW (ref >59)
GFR, EST NON AFRICAN AMERICAN: 26 mL/min/{1.73_m2} — AB (ref >59)
Glucose: 94 mg/dL (ref 65–99)
Potassium: 5.1 mmol/L (ref 3.5–5.2)
SODIUM: 142 mmol/L (ref 134–144)

## 2017-07-28 ENCOUNTER — Telehealth: Payer: Self-pay | Admitting: Cardiology

## 2017-07-28 DIAGNOSIS — I5032 Chronic diastolic (congestive) heart failure: Secondary | ICD-10-CM

## 2017-07-28 DIAGNOSIS — N183 Chronic kidney disease, stage 3 unspecified: Secondary | ICD-10-CM

## 2017-07-28 DIAGNOSIS — I110 Hypertensive heart disease with heart failure: Secondary | ICD-10-CM

## 2017-07-28 MED ORDER — AMLODIPINE BESYLATE 2.5 MG PO TABS: 2.5000 mg | ORAL_TABLET | Freq: Every day | ORAL | 3 refills | Status: DC

## 2017-07-28 NOTE — Telephone Encounter (Signed)
-----   Message from Dorothy Spark, MD sent at 07/27/2017 11:20 AM EDT ----- If her weight is stable I would increase water intake. Repeat labs - BMP and BNP in 4 weeks.

## 2017-07-28 NOTE — Telephone Encounter (Signed)
Spoke with Shelton Silvas (Friend on DPR) and endorsed to her the pts lab results and plan, per Dr Meda Coffee, as mentioned below Scheduled the pts repeat BMET and PRO-BNP for 4 weeks out on 08/25/17.  Shelton Silvas is going to contact the pts established Nephrologist, to obtain a follow-up appt on increased renal function.  While speaking to [place], she wanted me to ask Dr Meda Coffee should the pt go back on some kind of BP med, for noted pressures increasing into the 382N systolic and 05L diastolic.  Shelton Silvas is worried this will keep trending.  Informed Shelton Silvas that I will route this message to Dr Meda Coffee to review and advise on.  Informed Shelton Silvas that I will follow-up with her when further  recommendations are provided. Shelton Silvas verbalized understanding and agrees with this plan.       Notes recorded by Dorothy Spark, MD on 07/27/2017 at 11:20 AM EDT If her weight is stable I would increase water intake. Repeat labs - BMP and BNP in 4 weeks.

## 2017-07-28 NOTE — Telephone Encounter (Signed)
Spoke back with the Effie Shy, and endorsed to her that per Dr Meda Coffee, we will add low dose amlodipine 2.5 mg po daily.  Confirmed the pharmacy of choice with Shelton Silvas.  Shelton Silvas verbalized understanding and agrees with this plan.  Noted on the pts allergies was to diltiazem, but no documented reaction or allergy type. This was from 2010. Consulted this with our Pharmacist Megan, and she states that giving the pt low dose amlodipine is ok to proceed with.

## 2017-07-28 NOTE — Telephone Encounter (Signed)
We can try amlodipine 2.5 mg po daily

## 2017-07-28 NOTE — Telephone Encounter (Signed)
New Message     [name] is calling back on behlaf of patient in reference to lab results. Please call.

## 2017-07-29 LAB — FUNGUS CULTURE W SMEAR
MICRO NUMBER:: 90555555
SMEAR: NONE SEEN
SPECIMEN QUALITY: ADEQUATE

## 2017-07-29 LAB — RESPIRATORY CULTURE OR RESPIRATORY AND SPUTUM CULTURE
MICRO NUMBER:: 90555557
RESULT: NORMAL
SPECIMEN QUALITY:: ADEQUATE

## 2017-07-29 LAB — MYCOBACTERIA,CULT W/FLUOROCHROME SMEAR
MICRO NUMBER:: 90555556
SMEAR:: NONE SEEN
SPECIMEN QUALITY: ADEQUATE

## 2017-08-10 DIAGNOSIS — R350 Frequency of micturition: Secondary | ICD-10-CM | POA: Diagnosis not present

## 2017-08-10 DIAGNOSIS — Z6831 Body mass index (BMI) 31.0-31.9, adult: Secondary | ICD-10-CM | POA: Diagnosis not present

## 2017-08-10 DIAGNOSIS — J90 Pleural effusion, not elsewhere classified: Secondary | ICD-10-CM | POA: Diagnosis not present

## 2017-08-10 DIAGNOSIS — N183 Chronic kidney disease, stage 3 (moderate): Secondary | ICD-10-CM | POA: Diagnosis not present

## 2017-08-10 DIAGNOSIS — I129 Hypertensive chronic kidney disease with stage 1 through stage 4 chronic kidney disease, or unspecified chronic kidney disease: Secondary | ICD-10-CM | POA: Diagnosis not present

## 2017-08-10 DIAGNOSIS — I480 Paroxysmal atrial fibrillation: Secondary | ICD-10-CM | POA: Diagnosis not present

## 2017-08-10 DIAGNOSIS — Z950 Presence of cardiac pacemaker: Secondary | ICD-10-CM | POA: Diagnosis not present

## 2017-08-17 DIAGNOSIS — N183 Chronic kidney disease, stage 3 (moderate): Secondary | ICD-10-CM | POA: Diagnosis not present

## 2017-08-21 DIAGNOSIS — R300 Dysuria: Secondary | ICD-10-CM | POA: Diagnosis not present

## 2017-08-21 DIAGNOSIS — N390 Urinary tract infection, site not specified: Secondary | ICD-10-CM | POA: Diagnosis not present

## 2017-08-25 ENCOUNTER — Other Ambulatory Visit: Payer: Medicare HMO

## 2017-09-13 DIAGNOSIS — Z961 Presence of intraocular lens: Secondary | ICD-10-CM | POA: Diagnosis not present

## 2017-09-13 DIAGNOSIS — H26492 Other secondary cataract, left eye: Secondary | ICD-10-CM | POA: Diagnosis not present

## 2017-09-13 DIAGNOSIS — H5212 Myopia, left eye: Secondary | ICD-10-CM | POA: Diagnosis not present

## 2017-09-13 DIAGNOSIS — H353131 Nonexudative age-related macular degeneration, bilateral, early dry stage: Secondary | ICD-10-CM | POA: Diagnosis not present

## 2017-09-13 DIAGNOSIS — H18413 Arcus senilis, bilateral: Secondary | ICD-10-CM | POA: Diagnosis not present

## 2017-09-13 DIAGNOSIS — H52222 Regular astigmatism, left eye: Secondary | ICD-10-CM | POA: Diagnosis not present

## 2017-09-13 DIAGNOSIS — H5989 Other postprocedural complications and disorders of eye and adnexa, not elsewhere classified: Secondary | ICD-10-CM | POA: Diagnosis not present

## 2017-09-19 ENCOUNTER — Ambulatory Visit: Payer: Medicare HMO | Admitting: Pulmonary Disease

## 2017-09-27 DIAGNOSIS — R69 Illness, unspecified: Secondary | ICD-10-CM | POA: Diagnosis not present

## 2017-09-27 DIAGNOSIS — Z1389 Encounter for screening for other disorder: Secondary | ICD-10-CM | POA: Diagnosis not present

## 2017-09-27 DIAGNOSIS — J411 Mucopurulent chronic bronchitis: Secondary | ICD-10-CM | POA: Diagnosis not present

## 2017-09-27 DIAGNOSIS — I129 Hypertensive chronic kidney disease with stage 1 through stage 4 chronic kidney disease, or unspecified chronic kidney disease: Secondary | ICD-10-CM | POA: Diagnosis not present

## 2017-09-27 DIAGNOSIS — I4891 Unspecified atrial fibrillation: Secondary | ICD-10-CM | POA: Diagnosis not present

## 2017-09-27 DIAGNOSIS — I10 Essential (primary) hypertension: Secondary | ICD-10-CM | POA: Diagnosis not present

## 2017-09-27 DIAGNOSIS — Z9181 History of falling: Secondary | ICD-10-CM | POA: Diagnosis not present

## 2017-09-27 DIAGNOSIS — M8008XS Age-related osteoporosis with current pathological fracture, vertebra(e), sequela: Secondary | ICD-10-CM | POA: Diagnosis not present

## 2017-09-27 DIAGNOSIS — Z0000 Encounter for general adult medical examination without abnormal findings: Secondary | ICD-10-CM | POA: Diagnosis not present

## 2017-09-27 DIAGNOSIS — I5032 Chronic diastolic (congestive) heart failure: Secondary | ICD-10-CM | POA: Diagnosis not present

## 2017-09-27 DIAGNOSIS — M4800 Spinal stenosis, site unspecified: Secondary | ICD-10-CM | POA: Diagnosis not present

## 2017-09-27 DIAGNOSIS — R110 Nausea: Secondary | ICD-10-CM | POA: Diagnosis not present

## 2017-09-28 ENCOUNTER — Ambulatory Visit (INDEPENDENT_AMBULATORY_CARE_PROVIDER_SITE_OTHER)
Admission: RE | Admit: 2017-09-28 | Discharge: 2017-09-28 | Disposition: A | Discharge status: Home / Self Care | Payer: Medicare HMO | Source: Ambulatory Visit | Attending: Pulmonary Disease | Admitting: Pulmonary Disease

## 2017-09-28 ENCOUNTER — Encounter: Payer: Self-pay | Admitting: Pulmonary Disease

## 2017-09-28 ENCOUNTER — Ambulatory Visit: Payer: Medicare HMO | Admitting: Pulmonary Disease

## 2017-09-28 VITALS — BP 122/62 | HR 61 | Ht 64.0 in | Wt 162.4 lb

## 2017-09-28 DIAGNOSIS — R079 Chest pain, unspecified: Secondary | ICD-10-CM | POA: Diagnosis not present

## 2017-09-28 DIAGNOSIS — J471 Bronchiectasis with (acute) exacerbation: Secondary | ICD-10-CM

## 2017-09-28 DIAGNOSIS — J4540 Moderate persistent asthma, uncomplicated: Secondary | ICD-10-CM | POA: Diagnosis not present

## 2017-09-28 DIAGNOSIS — J90 Pleural effusion, not elsewhere classified: Secondary | ICD-10-CM

## 2017-09-28 DIAGNOSIS — J849 Interstitial pulmonary disease, unspecified: Secondary | ICD-10-CM

## 2017-09-28 NOTE — Progress Notes (Signed)
[facility]  Chief Complaint  Patient presents with  . Follow-up    pt states breathing has improved since last OV. occ prod cough with yellow to clear mucus & occ sob.    Constitutional: BP 122/62 (BP Location: Left Arm, Cuff Size: Normal)   Pulse 61   Ht 5\' 4"  (1.626 m)   Wt 162 lb 6.4 oz (73.7 kg)   SpO2 97%   BMI 27.88 kg/m   History of Present Illness: [name] is a 84 y.o. female former smoker with persistent asthma, bronchiectasis, and exudative left pleural effusion.  Her breathing has been doing much better.  She is not having cough, wheeze, or sputum.  Has mild chest discomfort on the left.  Not having fever, hemoptysis, skin rash, or abdominal pain.  Gets ankle swelling if she stands too long.  She wasn't using her inhalers or singulair for a while and breathing got worse >> better once these were restarted.  She is no longer needing oxygen.  She is here with family friend, and she asked that xray report be called to her friend.  Comprehensive Respiratory Exam:  Appearance - well kempt  ENMT - nasal mucosa moist, turbinates clear, midline nasal septum, no dental lesions, no gingival bleeding, no oral exudates, no tonsillar hypertrophy Neck - no masses, trachea midline, no thyromegaly, no elevation in JVP Respiratory - normal appearance of chest wall, normal respiratory effort w/o accessory muscle use, no dullness on percussion, no wheezing or rales CV - s1s2 regular rate and rhythm, no murmurs, mild ankle edema, radial pulses symmetric GI - soft, non tender, no masses Lymph - no adenopathy noted in neck and axillary areas MSK - normal muscle strength and tone, normal gait Ext - no cyanosis, clubbing, or joint inflammation noted Skin - no rashes, lesions, or ulcers Neuro - oriented to person, place, and time Psych - normal mood and affect   Assessment/Plan:  Moderate, persistent asthma. - continue symbicort,  singulair and prn albuterol  ILD. - her CT chest has subtle changes suggestive of UIP pattern - monitor clinically for now  Left pleural effusion. - will repeat chest xray today    Patient Instructions  Chest xray today  Follow up in 4 months    Chesley Mires, MD [place] 09/28/2017, 3:54 PM  Flow Sheet  Pulmonary tests: CT chest 06/06/08 >> BTX lower lobes V/Q scan 05/16/17 >> low probability for PE Spirometry 06/03/17 >> FEV1 1.23 (78%), FEV1% 86 HRCT chest 06/03/17 >> loculated Lt effusion, small Rt effusion, subpleural reticulation and traction BTX (reviewed by me) Lt thoracentesis 06/13/17 >> 600 ml fluid, glucose 101, LDH 273, protein 3.6, WBC 4226 (73% N), cytology negative, culture negative  Cardiac tests: Echo 03/08/17 >> EF 55 to 60%, grade 1 DD V/Q scan 05/17/17 >> no DVT  Past Medical History: She  has a past medical history of Anemia, Arthritis, Asthma, Cataract, Chronic diastolic CHF (congestive heart failure), NYHA class 2 ([place]) (03/19/2014), CKD (chronic kidney disease), stage III ([place]), Depression, Diverticulitis, DVT (deep venous thrombosis) ([place]), Essential hypertension, Fibromyalgia (12 y.a.), GERD (gastroesophageal reflux disease), Gout, cardiovascular stress test, Hypercholesteremia, Odynophagia, Other abnormal glucose, PAF (paroxysmal atrial fibrillation) ([place]), Personal history of other diseases of digestive system, Pneumonia, Sinus bradycardia, Skin cancer (2014), and Symptomatic bradycardia (06/2016).  Past Surgical History: She  has a past surgical history that includes Laryngoscopy / bronchoscopy / esophagoscopy (2010); Laminectomy (05/2007); Cholecystectomy (10/2005); Colonoscopy w/ biopsies (2005, 2011);  Skin cancer excision (Right, ~2005); Tubal ligation; Appendectomy (~55years ago); Abdominal hysterectomy; Tumor removal (Right, ~ 20 years ago); Knee arthroscopy (Right); Total knee arthroplasty (Left, 04/04/2013); PACEMAKER IMPLANT (N/A,  07/01/2016); and IR THORACENTESIS ASP PLEURAL SPACE W/IMG GUIDE (06/13/2017).  Family History: Her family history includes Arrhythmia in her mother; Asthma in her sister; Cancer in her brother and sister; Diabetes in her father and sister; Fainting in her mother; Heart attack in her brother; Heart disease in her mother; Hypertension in her father and mother; Stroke in her father; Sudden death in her brother.  Social History: She  reports that she quit smoking about 44 years ago. Her smoking use included cigarettes. She quit after 10.00 years of use. She has never used smokeless tobacco. She reports that she does not drink alcohol or use drugs.  Medications: Allergies as of 09/28/2017      Reactions   Penicillins Rash   Amoxicillin Other (See Comments)   Very weak Has patient had a PCN reaction causing immediate rash, facial/tongue/throat swelling, SOB or lightheadedness with hypotension: no Has patient had a PCN reaction causing severe rash involving mucus membranes or skin necrosis: no Has patient had a PCN reaction that required hospitalization: no Has patient had a PCN reaction occurring within the last 10 years: no If all of the above answers are "NO", then may proceed with Cephalosporin use.   Diltiazem Hcl Other (See Comments)   Reaction unknown   Sulfonamide Derivatives Other (See Comments)   Reaction unknown   Zetia [ezetimibe] Other (See Comments)   Weakness      Medication List        Accurate as of 09/28/17  3:54 PM. Always use your most recent med list.          acetaminophen 500 MG tablet Commonly known as:  TYLENOL Take 2 tablets (1,000 mg total) by mouth every 8 (eight) hours as needed for mild pain or headache.   albuterol 108 (90 Base) MCG/ACT inhaler Commonly known as:  PROVENTIL HFA;VENTOLIN HFA Inhale 1 puff into the lungs every 6 (six) hours as needed for wheezing or shortness of breath.   alendronate 70 MG tablet Commonly known as:  FOSAMAX Take 70 mg by  mouth once a week. On Friday   allopurinol 100 MG tablet Commonly known as:  ZYLOPRIM Take 1 tablet (100 mg total) by mouth daily.   amLODipine 2.5 MG tablet Commonly known as:  NORVASC Take 1 tablet (2.5 mg total) by mouth daily.   aspirin EC 81 MG tablet Take 81 mg by mouth daily.   budesonide-formoterol 80-4.5 MCG/ACT inhaler Commonly known as:  SYMBICORT Inhale 2 puffs into the lungs 2 (two) times daily.   cholecalciferol 1000 units tablet Commonly known as:  VITAMIN D Take 1,000 Units by mouth daily.   colchicine 0.6 MG tablet Take 1 tablet by mouth every other day.   Ferrous Sulfate 28 MG Tabs Take 28 mg by mouth daily.   Magnesium Oxide 200 MG Tabs Take 1 tablet (200 mg total) by mouth 2 (two) times daily.   montelukast 10 MG tablet Commonly known as:  SINGULAIR Take 10 mg by mouth at bedtime.   pravastatin 20 MG tablet Commonly known as:  PRAVACHOL Take 20 mg by mouth at bedtime.   ranitidine 150 MG tablet Commonly known as:  ZANTAC Take 1 tablet by mouth at bedtime.   sertraline 50 MG tablet Commonly known as:  ZOLOFT Take 50 mg by mouth daily.   traMADol  50 MG tablet Commonly known as:  ULTRAM Take 1 tablet (50 mg total) by mouth every 12 (twelve) hours as needed for moderate pain.

## 2017-09-28 NOTE — Patient Instructions (Signed)
Chest xray today  Follow up in 4 months 

## 2017-09-29 ENCOUNTER — Telehealth: Payer: Self-pay | Admitting: Pulmonary Disease

## 2017-09-29 NOTE — Telephone Encounter (Signed)
Called and spoke to [name]) and relayed below results.  [name] voiced her understanding. Nothing further is needed.

## 2017-09-29 NOTE — Telephone Encounter (Signed)
Dg Chest 2 View  Result Date: 09/28/2017 CLINICAL DATA:  LEFT pleural effusion, follow-up, posterior LEFT chest pain, history asthma, CHF, hypertension, pneumonia, former smoker EXAM: CHEST - 2 VIEW COMPARISON:  07/11/2017 FINDINGS: LEFT subclavian transvenous pacemaker leads project at RIGHT atrium and RIGHT ventricle, unchanged. Enlargement of cardiac silhouette with pulmonary vascular congestion. Atherosclerotic calcification aorta. Bronchitic changes with bibasilar atelectasis versus scarring. Resolution of LEFT pleural effusion seen on previous exam. No acute infiltrate or pneumothorax. Bones demineralized. IMPRESSION: Enlargement of cardiac silhouette with pulmonary vascular congestion post pacemaker. Bronchitic changes with bibasilar atelectasis. Resolution of previously seen LEFT pleural effusion. Electronically Signed   By: Lavonia Dana M.D.   On: 09/28/2017 20:16     Please let her know CXR did not show any recurrent of fluid around the lungs.  No change to current tx plan.

## 2017-10-04 DIAGNOSIS — H5989 Other postprocedural complications and disorders of eye and adnexa, not elsewhere classified: Secondary | ICD-10-CM | POA: Diagnosis not present

## 2017-10-04 DIAGNOSIS — H5201 Hypermetropia, right eye: Secondary | ICD-10-CM | POA: Diagnosis not present

## 2017-10-04 DIAGNOSIS — H52223 Regular astigmatism, bilateral: Secondary | ICD-10-CM | POA: Diagnosis not present

## 2017-10-04 DIAGNOSIS — H524 Presbyopia: Secondary | ICD-10-CM | POA: Diagnosis not present

## 2017-10-04 DIAGNOSIS — H26491 Other secondary cataract, right eye: Secondary | ICD-10-CM | POA: Diagnosis not present

## 2017-10-05 ENCOUNTER — Ambulatory Visit (INDEPENDENT_AMBULATORY_CARE_PROVIDER_SITE_OTHER): Payer: Medicare HMO | Admitting: *Deleted

## 2017-10-05 DIAGNOSIS — I495 Sick sinus syndrome: Secondary | ICD-10-CM | POA: Diagnosis not present

## 2017-10-05 NOTE — Progress Notes (Signed)
Remote pacemaker transmission.   

## 2017-10-07 DIAGNOSIS — R69 Illness, unspecified: Secondary | ICD-10-CM | POA: Diagnosis not present

## 2017-10-07 DIAGNOSIS — I5032 Chronic diastolic (congestive) heart failure: Secondary | ICD-10-CM | POA: Diagnosis not present

## 2017-10-07 DIAGNOSIS — E782 Mixed hyperlipidemia: Secondary | ICD-10-CM | POA: Diagnosis not present

## 2017-10-07 DIAGNOSIS — M1990 Unspecified osteoarthritis, unspecified site: Secondary | ICD-10-CM | POA: Diagnosis not present

## 2017-10-07 DIAGNOSIS — J411 Mucopurulent chronic bronchitis: Secondary | ICD-10-CM | POA: Diagnosis not present

## 2017-10-07 DIAGNOSIS — D649 Anemia, unspecified: Secondary | ICD-10-CM | POA: Diagnosis not present

## 2017-10-07 DIAGNOSIS — I4891 Unspecified atrial fibrillation: Secondary | ICD-10-CM | POA: Diagnosis not present

## 2017-10-07 DIAGNOSIS — I10 Essential (primary) hypertension: Secondary | ICD-10-CM | POA: Diagnosis not present

## 2017-10-14 DIAGNOSIS — E782 Mixed hyperlipidemia: Secondary | ICD-10-CM | POA: Diagnosis not present

## 2017-10-14 DIAGNOSIS — M4800 Spinal stenosis, site unspecified: Secondary | ICD-10-CM | POA: Diagnosis not present

## 2017-10-14 DIAGNOSIS — I130 Hypertensive heart and chronic kidney disease with heart failure and stage 1 through stage 4 chronic kidney disease, or unspecified chronic kidney disease: Secondary | ICD-10-CM | POA: Diagnosis not present

## 2017-10-14 DIAGNOSIS — I5032 Chronic diastolic (congestive) heart failure: Secondary | ICD-10-CM | POA: Diagnosis not present

## 2017-10-14 DIAGNOSIS — D631 Anemia in chronic kidney disease: Secondary | ICD-10-CM | POA: Diagnosis not present

## 2017-10-14 DIAGNOSIS — R69 Illness, unspecified: Secondary | ICD-10-CM | POA: Diagnosis not present

## 2017-10-14 DIAGNOSIS — Z9181 History of falling: Secondary | ICD-10-CM | POA: Diagnosis not present

## 2017-10-14 DIAGNOSIS — I4891 Unspecified atrial fibrillation: Secondary | ICD-10-CM | POA: Diagnosis not present

## 2017-10-14 DIAGNOSIS — M8008XS Age-related osteoporosis with current pathological fracture, vertebra(e), sequela: Secondary | ICD-10-CM | POA: Diagnosis not present

## 2017-10-14 DIAGNOSIS — I129 Hypertensive chronic kidney disease with stage 1 through stage 4 chronic kidney disease, or unspecified chronic kidney disease: Secondary | ICD-10-CM | POA: Diagnosis not present

## 2017-10-14 DIAGNOSIS — M545 Low back pain: Secondary | ICD-10-CM | POA: Diagnosis not present

## 2017-10-14 DIAGNOSIS — I10 Essential (primary) hypertension: Secondary | ICD-10-CM | POA: Diagnosis not present

## 2017-10-14 DIAGNOSIS — M1990 Unspecified osteoarthritis, unspecified site: Secondary | ICD-10-CM | POA: Diagnosis not present

## 2017-10-14 DIAGNOSIS — N189 Chronic kidney disease, unspecified: Secondary | ICD-10-CM | POA: Diagnosis not present

## 2017-10-14 DIAGNOSIS — D649 Anemia, unspecified: Secondary | ICD-10-CM | POA: Diagnosis not present

## 2017-10-14 DIAGNOSIS — I341 Nonrheumatic mitral (valve) prolapse: Secondary | ICD-10-CM | POA: Diagnosis not present

## 2017-10-14 DIAGNOSIS — J411 Mucopurulent chronic bronchitis: Secondary | ICD-10-CM | POA: Diagnosis not present

## 2017-10-18 DIAGNOSIS — M8008XS Age-related osteoporosis with current pathological fracture, vertebra(e), sequela: Secondary | ICD-10-CM | POA: Diagnosis not present

## 2017-10-18 DIAGNOSIS — D631 Anemia in chronic kidney disease: Secondary | ICD-10-CM | POA: Diagnosis not present

## 2017-10-18 DIAGNOSIS — I130 Hypertensive heart and chronic kidney disease with heart failure and stage 1 through stage 4 chronic kidney disease, or unspecified chronic kidney disease: Secondary | ICD-10-CM | POA: Diagnosis not present

## 2017-10-18 DIAGNOSIS — Z9181 History of falling: Secondary | ICD-10-CM | POA: Diagnosis not present

## 2017-10-18 DIAGNOSIS — M1990 Unspecified osteoarthritis, unspecified site: Secondary | ICD-10-CM | POA: Diagnosis not present

## 2017-10-18 DIAGNOSIS — M4800 Spinal stenosis, site unspecified: Secondary | ICD-10-CM | POA: Diagnosis not present

## 2017-10-18 DIAGNOSIS — N189 Chronic kidney disease, unspecified: Secondary | ICD-10-CM | POA: Diagnosis not present

## 2017-10-18 DIAGNOSIS — I5032 Chronic diastolic (congestive) heart failure: Secondary | ICD-10-CM | POA: Diagnosis not present

## 2017-10-18 DIAGNOSIS — M545 Low back pain: Secondary | ICD-10-CM | POA: Diagnosis not present

## 2017-10-18 DIAGNOSIS — I341 Nonrheumatic mitral (valve) prolapse: Secondary | ICD-10-CM | POA: Diagnosis not present

## 2017-10-19 DIAGNOSIS — R69 Illness, unspecified: Secondary | ICD-10-CM | POA: Diagnosis not present

## 2017-10-20 DIAGNOSIS — I341 Nonrheumatic mitral (valve) prolapse: Secondary | ICD-10-CM | POA: Diagnosis not present

## 2017-10-20 DIAGNOSIS — I5032 Chronic diastolic (congestive) heart failure: Secondary | ICD-10-CM | POA: Diagnosis not present

## 2017-10-20 DIAGNOSIS — M545 Low back pain: Secondary | ICD-10-CM | POA: Diagnosis not present

## 2017-10-20 DIAGNOSIS — M4800 Spinal stenosis, site unspecified: Secondary | ICD-10-CM | POA: Diagnosis not present

## 2017-10-20 DIAGNOSIS — Z9181 History of falling: Secondary | ICD-10-CM | POA: Diagnosis not present

## 2017-10-20 DIAGNOSIS — M1990 Unspecified osteoarthritis, unspecified site: Secondary | ICD-10-CM | POA: Diagnosis not present

## 2017-10-20 DIAGNOSIS — N189 Chronic kidney disease, unspecified: Secondary | ICD-10-CM | POA: Diagnosis not present

## 2017-10-20 DIAGNOSIS — I130 Hypertensive heart and chronic kidney disease with heart failure and stage 1 through stage 4 chronic kidney disease, or unspecified chronic kidney disease: Secondary | ICD-10-CM | POA: Diagnosis not present

## 2017-10-20 DIAGNOSIS — D631 Anemia in chronic kidney disease: Secondary | ICD-10-CM | POA: Diagnosis not present

## 2017-10-20 DIAGNOSIS — M8008XS Age-related osteoporosis with current pathological fracture, vertebra(e), sequela: Secondary | ICD-10-CM | POA: Diagnosis not present

## 2017-10-24 DIAGNOSIS — R69 Illness, unspecified: Secondary | ICD-10-CM | POA: Diagnosis not present

## 2017-10-26 DIAGNOSIS — K5900 Constipation, unspecified: Secondary | ICD-10-CM | POA: Diagnosis not present

## 2017-10-26 DIAGNOSIS — K5790 Diverticulosis of intestine, part unspecified, without perforation or abscess without bleeding: Secondary | ICD-10-CM | POA: Diagnosis not present

## 2017-10-26 DIAGNOSIS — R1084 Generalized abdominal pain: Secondary | ICD-10-CM | POA: Diagnosis not present

## 2017-10-26 DIAGNOSIS — K582 Mixed irritable bowel syndrome: Secondary | ICD-10-CM | POA: Diagnosis not present

## 2017-10-26 DIAGNOSIS — G43A0 Cyclical vomiting, not intractable: Secondary | ICD-10-CM | POA: Diagnosis not present

## 2017-10-27 ENCOUNTER — Ambulatory Visit
Admission: RE | Admit: 2017-10-27 | Discharge: 2017-10-27 | Disposition: A | Discharge status: Home / Self Care | Payer: Medicare HMO | Source: Ambulatory Visit | Attending: Physician Assistant | Admitting: Physician Assistant

## 2017-10-27 ENCOUNTER — Other Ambulatory Visit: Payer: Self-pay | Admitting: Physician Assistant

## 2017-10-27 DIAGNOSIS — R1084 Generalized abdominal pain: Secondary | ICD-10-CM

## 2017-10-31 ENCOUNTER — Other Ambulatory Visit: Payer: Self-pay | Admitting: Physician Assistant

## 2017-10-31 DIAGNOSIS — R1084 Generalized abdominal pain: Secondary | ICD-10-CM

## 2017-11-01 DIAGNOSIS — M8008XS Age-related osteoporosis with current pathological fracture, vertebra(e), sequela: Secondary | ICD-10-CM | POA: Diagnosis not present

## 2017-11-01 DIAGNOSIS — Z9181 History of falling: Secondary | ICD-10-CM | POA: Diagnosis not present

## 2017-11-01 DIAGNOSIS — N189 Chronic kidney disease, unspecified: Secondary | ICD-10-CM | POA: Diagnosis not present

## 2017-11-01 DIAGNOSIS — I130 Hypertensive heart and chronic kidney disease with heart failure and stage 1 through stage 4 chronic kidney disease, or unspecified chronic kidney disease: Secondary | ICD-10-CM | POA: Diagnosis not present

## 2017-11-01 DIAGNOSIS — M545 Low back pain: Secondary | ICD-10-CM | POA: Diagnosis not present

## 2017-11-01 DIAGNOSIS — D631 Anemia in chronic kidney disease: Secondary | ICD-10-CM | POA: Diagnosis not present

## 2017-11-01 DIAGNOSIS — M4800 Spinal stenosis, site unspecified: Secondary | ICD-10-CM | POA: Diagnosis not present

## 2017-11-01 DIAGNOSIS — M1990 Unspecified osteoarthritis, unspecified site: Secondary | ICD-10-CM | POA: Diagnosis not present

## 2017-11-01 DIAGNOSIS — I5032 Chronic diastolic (congestive) heart failure: Secondary | ICD-10-CM | POA: Diagnosis not present

## 2017-11-01 DIAGNOSIS — I341 Nonrheumatic mitral (valve) prolapse: Secondary | ICD-10-CM | POA: Diagnosis not present

## 2017-11-02 LAB — CUP PACEART REMOTE DEVICE CHECK
Implantable Lead Implant Date: 20180524
Implantable Lead Location: 753859
Implantable Lead Location: 753860
Implantable Lead Model: 7741
Implantable Lead Serial Number: 861246
Implantable Pulse Generator Implant Date: 20180524
MDC IDC LEAD IMPLANT DT: 20180524
MDC IDC LEAD SERIAL: 693708
MDC IDC SESS DTM: 20190925103207
Pulse Gen Serial Number: 308044

## 2017-11-04 ENCOUNTER — Ambulatory Visit
Admission: RE | Admit: 2017-11-04 | Discharge: 2017-11-04 | Disposition: A | Discharge status: Home / Self Care | Payer: Medicare HMO | Source: Ambulatory Visit | Attending: Physician Assistant | Admitting: Physician Assistant

## 2017-11-04 DIAGNOSIS — R1084 Generalized abdominal pain: Secondary | ICD-10-CM

## 2017-11-04 DIAGNOSIS — N289 Disorder of kidney and ureter, unspecified: Secondary | ICD-10-CM | POA: Diagnosis not present

## 2017-11-07 ENCOUNTER — Encounter

## 2017-11-07 DIAGNOSIS — I5032 Chronic diastolic (congestive) heart failure: Secondary | ICD-10-CM | POA: Diagnosis not present

## 2017-11-07 DIAGNOSIS — E782 Mixed hyperlipidemia: Secondary | ICD-10-CM | POA: Diagnosis not present

## 2017-11-07 DIAGNOSIS — I129 Hypertensive chronic kidney disease with stage 1 through stage 4 chronic kidney disease, or unspecified chronic kidney disease: Secondary | ICD-10-CM | POA: Diagnosis not present

## 2017-11-07 DIAGNOSIS — M1990 Unspecified osteoarthritis, unspecified site: Secondary | ICD-10-CM | POA: Diagnosis not present

## 2017-11-07 DIAGNOSIS — I10 Essential (primary) hypertension: Secondary | ICD-10-CM | POA: Diagnosis not present

## 2017-11-07 DIAGNOSIS — J411 Mucopurulent chronic bronchitis: Secondary | ICD-10-CM | POA: Diagnosis not present

## 2017-11-07 DIAGNOSIS — D649 Anemia, unspecified: Secondary | ICD-10-CM | POA: Diagnosis not present

## 2017-11-07 DIAGNOSIS — I4891 Unspecified atrial fibrillation: Secondary | ICD-10-CM | POA: Diagnosis not present

## 2017-11-07 DIAGNOSIS — R69 Illness, unspecified: Secondary | ICD-10-CM | POA: Diagnosis not present

## 2017-11-08 DIAGNOSIS — D649 Anemia, unspecified: Secondary | ICD-10-CM | POA: Diagnosis not present

## 2017-11-08 DIAGNOSIS — I4891 Unspecified atrial fibrillation: Secondary | ICD-10-CM | POA: Diagnosis not present

## 2017-11-08 DIAGNOSIS — I5032 Chronic diastolic (congestive) heart failure: Secondary | ICD-10-CM | POA: Diagnosis not present

## 2017-11-08 DIAGNOSIS — I129 Hypertensive chronic kidney disease with stage 1 through stage 4 chronic kidney disease, or unspecified chronic kidney disease: Secondary | ICD-10-CM | POA: Diagnosis not present

## 2017-11-08 DIAGNOSIS — I10 Essential (primary) hypertension: Secondary | ICD-10-CM | POA: Diagnosis not present

## 2017-11-08 DIAGNOSIS — R69 Illness, unspecified: Secondary | ICD-10-CM | POA: Diagnosis not present

## 2017-11-08 DIAGNOSIS — J411 Mucopurulent chronic bronchitis: Secondary | ICD-10-CM | POA: Diagnosis not present

## 2017-11-08 DIAGNOSIS — E782 Mixed hyperlipidemia: Secondary | ICD-10-CM | POA: Diagnosis not present

## 2017-11-08 DIAGNOSIS — M1990 Unspecified osteoarthritis, unspecified site: Secondary | ICD-10-CM | POA: Diagnosis not present

## 2017-11-09 DIAGNOSIS — I341 Nonrheumatic mitral (valve) prolapse: Secondary | ICD-10-CM | POA: Diagnosis not present

## 2017-11-09 DIAGNOSIS — M4800 Spinal stenosis, site unspecified: Secondary | ICD-10-CM | POA: Diagnosis not present

## 2017-11-09 DIAGNOSIS — D631 Anemia in chronic kidney disease: Secondary | ICD-10-CM | POA: Diagnosis not present

## 2017-11-09 DIAGNOSIS — I129 Hypertensive chronic kidney disease with stage 1 through stage 4 chronic kidney disease, or unspecified chronic kidney disease: Secondary | ICD-10-CM | POA: Diagnosis not present

## 2017-11-09 DIAGNOSIS — M8008XS Age-related osteoporosis with current pathological fracture, vertebra(e), sequela: Secondary | ICD-10-CM | POA: Diagnosis not present

## 2017-11-09 DIAGNOSIS — I5032 Chronic diastolic (congestive) heart failure: Secondary | ICD-10-CM | POA: Diagnosis not present

## 2017-11-09 DIAGNOSIS — N189 Chronic kidney disease, unspecified: Secondary | ICD-10-CM | POA: Diagnosis not present

## 2017-11-09 DIAGNOSIS — M1990 Unspecified osteoarthritis, unspecified site: Secondary | ICD-10-CM | POA: Diagnosis not present

## 2017-11-09 DIAGNOSIS — M545 Low back pain: Secondary | ICD-10-CM | POA: Diagnosis not present

## 2017-11-09 DIAGNOSIS — N183 Chronic kidney disease, stage 3 (moderate): Secondary | ICD-10-CM | POA: Diagnosis not present

## 2017-11-09 DIAGNOSIS — Z9181 History of falling: Secondary | ICD-10-CM | POA: Diagnosis not present

## 2017-11-09 DIAGNOSIS — Z6831 Body mass index (BMI) 31.0-31.9, adult: Secondary | ICD-10-CM | POA: Diagnosis not present

## 2017-11-09 DIAGNOSIS — I130 Hypertensive heart and chronic kidney disease with heart failure and stage 1 through stage 4 chronic kidney disease, or unspecified chronic kidney disease: Secondary | ICD-10-CM | POA: Diagnosis not present

## 2017-11-10 DIAGNOSIS — R69 Illness, unspecified: Secondary | ICD-10-CM | POA: Diagnosis not present

## 2017-11-10 DIAGNOSIS — R32 Unspecified urinary incontinence: Secondary | ICD-10-CM | POA: Diagnosis not present

## 2017-11-10 DIAGNOSIS — K219 Gastro-esophageal reflux disease without esophagitis: Secondary | ICD-10-CM | POA: Diagnosis not present

## 2017-11-10 DIAGNOSIS — J45909 Unspecified asthma, uncomplicated: Secondary | ICD-10-CM | POA: Diagnosis not present

## 2017-11-10 DIAGNOSIS — M1990 Unspecified osteoarthritis, unspecified site: Secondary | ICD-10-CM | POA: Diagnosis not present

## 2017-11-10 DIAGNOSIS — M109 Gout, unspecified: Secondary | ICD-10-CM | POA: Diagnosis not present

## 2017-11-10 DIAGNOSIS — I10 Essential (primary) hypertension: Secondary | ICD-10-CM | POA: Diagnosis not present

## 2017-11-10 DIAGNOSIS — E669 Obesity, unspecified: Secondary | ICD-10-CM | POA: Diagnosis not present

## 2017-11-10 DIAGNOSIS — E785 Hyperlipidemia, unspecified: Secondary | ICD-10-CM | POA: Diagnosis not present

## 2017-11-10 DIAGNOSIS — M810 Age-related osteoporosis without current pathological fracture: Secondary | ICD-10-CM | POA: Diagnosis not present

## 2017-11-11 DIAGNOSIS — K219 Gastro-esophageal reflux disease without esophagitis: Secondary | ICD-10-CM | POA: Diagnosis not present

## 2017-11-11 DIAGNOSIS — R1084 Generalized abdominal pain: Secondary | ICD-10-CM | POA: Diagnosis not present

## 2017-11-11 DIAGNOSIS — D649 Anemia, unspecified: Secondary | ICD-10-CM | POA: Diagnosis not present

## 2017-11-11 DIAGNOSIS — R198 Other specified symptoms and signs involving the digestive system and abdomen: Secondary | ICD-10-CM | POA: Diagnosis not present

## 2017-11-11 DIAGNOSIS — K582 Mixed irritable bowel syndrome: Secondary | ICD-10-CM | POA: Diagnosis not present

## 2017-11-29 DIAGNOSIS — D649 Anemia, unspecified: Secondary | ICD-10-CM | POA: Diagnosis not present

## 2017-12-20 DIAGNOSIS — H903 Sensorineural hearing loss, bilateral: Secondary | ICD-10-CM | POA: Diagnosis not present

## 2017-12-23 ENCOUNTER — Encounter (HOSPITAL_COMMUNITY): Payer: Self-pay | Admitting: Emergency Medicine

## 2017-12-23 ENCOUNTER — Ambulatory Visit (HOSPITAL_COMMUNITY)
Admission: EM | Admit: 2017-12-23 | Discharge: 2017-12-23 | Disposition: A | Discharge status: Home / Self Care | Payer: Medicare HMO | Attending: Internal Medicine | Admitting: Internal Medicine

## 2017-12-23 DIAGNOSIS — J019 Acute sinusitis, unspecified: Secondary | ICD-10-CM

## 2017-12-23 DIAGNOSIS — R053 Chronic cough: Secondary | ICD-10-CM

## 2017-12-23 DIAGNOSIS — R05 Cough: Secondary | ICD-10-CM

## 2017-12-23 MED ORDER — AZITHROMYCIN 250 MG PO TABS: 250.0000 mg | ORAL_TABLET | Freq: Every day | ORAL | 0 refills | Status: DC

## 2017-12-23 MED ORDER — BENZONATATE 200 MG PO CAPS: 200.0000 mg | ORAL_CAPSULE | Freq: Three times a day (TID) | ORAL | 0 refills | Status: DC | PRN

## 2017-12-23 MED ORDER — ACETAMINOPHEN 325 MG PO TABS
ORAL_TABLET | ORAL | Status: AC
  Filled 2017-12-23: qty 2

## 2017-12-23 MED ORDER — ACETAMINOPHEN 325 MG PO TABS
650.0000 mg | ORAL_TABLET | Freq: Once | ORAL | Status: AC
  Administered 2017-12-23: 650 mg via ORAL

## 2017-12-23 NOTE — Discharge Instructions (Addendum)
Chest exam was clear today.  Chest xray was not done because of clear chest exam and decision to treat with antibiotics anyway, for persistent phlegm production/sinus drainage.  Would reconsider if not improving in a few days.  Prescription for zithromax (antibiotic) and for benzonatate cough perles were sent to the pharmacy. Anticipate gradual improvement in cough, sinus drainage, fever and well being over the next several days.  Cough may take a couple weeks to subside.  Recheck for new fever >100.5, increasing phlegm production/nasal discharge, or if not starting to improve in a few days.

## 2017-12-23 NOTE — ED Triage Notes (Signed)
Pt c/o cough, cold chills x2 weeks, states shes coughing up yellow gunk.

## 2017-12-23 NOTE — ED Provider Notes (Signed)
[facility]    CSN: [id number] Arrival date & time: 12/23/17  1422     History   Chief Complaint Chief Complaint  Patient presents with  . Cough    HPI [name] is a 84 y.o. female.   She presents today with 2-week history of head congestion, sinus drainage, productive cough.  She feels like things are getting a little worse, and has some low-grade fever today.  No nausea/vomiting, no diarrhea.    HPI  Past Medical History:  Diagnosis Date  . Anemia    "I stay anemic"  . Arthritis   . Asthma    no problems recent  . Cataract   . Chronic diastolic CHF (congestive heart failure), NYHA class 2 ([place]) 03/19/2014   Echo 4/19: mod LVH, EF 55-60, no RWMA, Gr 1 DD, MAC, trivial MR, mod LAE, normal RVSF, PASP 19  . CKD (chronic kidney disease), stage III ([place])   . Depression   . Diverticulitis   . DVT (deep venous thrombosis) ([place])    from birth control, left groin  . Essential hypertension   . Fibromyalgia 10 y.a.  . GERD (gastroesophageal reflux disease)   . Gout   . Hx of cardiovascular stress test    Lexiscan Myoview (8/15): apical attenuation artifact, no ischemia, EF 69% - low risk.  Marland Kitchen Hypercholesteremia   . Odynophagia   . Other abnormal glucose   . PAF (paroxysmal atrial fibrillation) ([place])   . Personal history of other diseases of digestive system   . Pneumonia    hx of  . Sinus bradycardia   . Skin cancer 2014   skin R elbow & face  . Symptomatic bradycardia 06/2016    Patient Active Problem List   Diagnosis Date Noted  . Pacemaker 07/06/2017  . Chronic respiratory failure with hypoxia ([place]) 06/09/2017  . Pleural effusion associated with pulmonary infection 06/09/2017  . Hyperkalemia 06/09/2017  . Pleuritic chest pain 06/09/2017  . Fever 05/14/2017  . Renal insufficiency   . Constipation 04/25/2017  . Urine finding 04/25/2017  . Posterior rhinorrhea 04/25/2017  . Acute upper respiratory infection 04/25/2017  . Thrombocytopenia  ([place]) 01/15/2017  . CAP (community acquired pneumonia) 01/15/2017  . Symptomatic bradycardia 06/30/2016  . Hypertensive heart disease 04/01/2016  . Bradycardia 04/01/2016  . Fall 04/01/2016  . Sinus bradycardia   . CKD (chronic kidney disease), stage III ([place])   . Normochromic normocytic anemia   . Essential hypertension   . PAF (paroxysmal atrial fibrillation) ([place])   . Palpitations 09/18/2014  . Shortness of breath 03/19/2014  . Chronic diastolic CHF (congestive heart failure), NYHA class 2 ([place]) 03/19/2014  . Fatigue 09/11/2013  . Gastroenteritis 07/20/2013  . Abdominal pain 07/20/2013  . Abnormal x-ray of abdomen 07/20/2013  . Diarrhea 07/20/2013  . Generalized weakness 07/20/2013  . Obesity (BMI 30-39.9) 07/20/2013  . Hypercholesteremia   . GERD (gastroesophageal reflux disease)   . Depression   . Asthma   . Gout   . Acute blood loss anemia 04/05/2013  . Osteoarthritis of left knee 04/04/2013  . Total knee replacement status 04/04/2013  . Pulmonary hypertension ([place]) 03/07/2013  . Anemia, unspecified 03/19/2012  . Unspecified deficiency anemia 03/19/2012  . Dizziness 07/09/2010  . Chest pain 07/30/2009  . HYPERTENSION, UNSPECIFIED 08/19/2008  . PAROXYSMAL ATRIAL FIBRILLATION 08/19/2008  . GASTROESOPHAGEAL REFLUX DISEASE, HX OF 08/19/2008    Past Surgical History:  Procedure Laterality Date  . ABDOMINAL HYSTERECTOMY     partial  .  APPENDECTOMY  ~55years ago  . CHOLECYSTECTOMY  10/2005   Dr. Effie Shy  . COLONOSCOPY W/ BIOPSIES  2005, 2011   Dr. Bing Plume, "balloon inside for last one at [facility]"  . IR THORACENTESIS ASP PLEURAL SPACE W/IMG GUIDE  06/13/2017  . KNEE ARTHROSCOPY Right   . LAMINECTOMY  05/2007   foraminotomy, L4-5 laminectomy  . LARYNGOSCOPY / BRONCHOSCOPY / ESOPHAGOSCOPY  2010   Dr. Benjamine Mola  . PACEMAKER IMPLANT N/A 07/01/2016   Procedure: Pacemaker Implant;  Surgeon: Evans Lance, MD;  Location: [facility];  Service: Cardiovascular;   Laterality: N/A;  . SKIN CANCER EXCISION Right ~2005   r elbow  . TOTAL KNEE ARTHROPLASTY Left 04/04/2013   Procedure: LEFT TOTAL KNEE ARTHROPLASTY;  Surgeon: Tobi Bastos, MD;  Location: [facility];  Service: Orthopedics;  Laterality: Left;  . TUBAL LIGATION    . TUMOR REMOVAL Right ~ 20 years ago   tumor in between toes     Home Medications    Prior to Admission medications   Medication Sig Start Date End Date Taking? Authorizing Provider  acetaminophen (TYLENOL) 500 MG tablet Take 2 tablets (1,000 mg total) by mouth every 8 (eight) hours as needed for mild pain or headache. 05/16/17   Hongalgi, Lenis Dickinson, MD  albuterol (PROVENTIL HFA;VENTOLIN HFA) 108 (90 BASE) MCG/ACT inhaler Inhale 1 puff into the lungs every 6 (six) hours as needed for wheezing or shortness of breath. 11/13/14   Dorothy Spark, MD  alendronate (FOSAMAX) 70 MG tablet Take 70 mg by mouth once a week. On Friday 02/13/14   [provider]  allopurinol (ZYLOPRIM) 100 MG tablet Take 1 tablet (100 mg total) by mouth daily. 04/01/16   Haney, Yetta Flock A, MD  amLODipine (NORVASC) 2.5 MG tablet Take 1 tablet (2.5 mg total) by mouth daily. 07/28/17   Dorothy Spark, MD  aspirin EC 81 MG tablet Take 81 mg by mouth daily.     [provider]  azithromycin (ZITHROMAX) 250 MG tablet Take 1 tablet (250 mg total) by mouth daily. Take first 2 tablets together, then 1 every day until finished. 12/23/17   Wynona Luna, MD  benzonatate (TESSALON) 200 MG capsule Take 1 capsule (200 mg total) by mouth 3 (three) times daily as needed for cough. 12/23/17   Wynona Luna, MD  budesonide-formoterol [facility]) 80-4.5 MCG/ACT inhaler Inhale 2 puffs into the lungs 2 (two) times daily. 05/20/17   Chesley Mires, MD  cholecalciferol (VITAMIN D) 1000 units tablet Take 1,000 Units by mouth daily.     [provider]  colchicine 0.6 MG tablet Take 1 tablet by mouth every other day. 07/26/16   [provider]    Ferrous Sulfate 28 MG TABS Take 28 mg by mouth daily.     [provider]  Magnesium Oxide 200 MG TABS Take 1 tablet (200 mg total) by mouth 2 (two) times daily. 06/13/17   Dorothy Spark, MD  montelukast (SINGULAIR) 10 MG tablet Take 10 mg by mouth at bedtime.      [provider]  pravastatin (PRAVACHOL) 20 MG tablet Take 20 mg by mouth at bedtime.  09/17/14   [provider]  ranitidine (ZANTAC) 150 MG tablet Take 1 tablet by mouth at bedtime. 09/24/16   [provider]  sertraline (ZOLOFT) 50 MG tablet Take 50 mg by mouth daily.  03/15/12   [provider]  traMADol (ULTRAM) 50 MG tablet Take 1 tablet (50  mg total) by mouth every 12 (twelve) hours as needed for moderate pain. 05/16/17   Hongalgi, Lenis Dickinson, MD    Family History Family History  Problem Relation Age of Onset  . Heart disease Mother   . Hypertension Mother   . Fainting Mother   . Arrhythmia Mother   . Diabetes Father   . Stroke Father   . Hypertension Father   . Cancer Brother   . Sudden death Brother   . Heart attack Brother   . Cancer Sister   . Diabetes Sister   . Asthma Sister     Social History Social History   Tobacco Use  . Smoking status: Former Smoker    Years: 10.00    Types: Cigarettes    Last attempt to quit: 02/08/1973    Years since quitting: 44.9  . Smokeless tobacco: Never Used  Substance Use Topics  . Alcohol use: No  . Drug use: No     Allergies   Penicillins; Amoxicillin; Diltiazem hcl; Sulfonamide derivatives; and Zetia [ezetimibe]   Review of Systems Review of Systems  All other systems reviewed and are negative.    Physical Exam Triage Vital Signs ED Triage Vitals [12/23/17 1508]  Enc Vitals Group     BP (!) 151/54     Pulse Rate 69     Resp 20     Temp (!) 100.7 F (38.2 C)     Temp Source Oral     SpO2 95 %     Weight      Height      Pain Score 6     Pain Loc    Updated Vital Signs BP (!) 151/54   Pulse 69   Temp  (!) 100.7 F (38.2 C) (Oral)   Resp 20   SpO2 95%  Physical Exam  Constitutional: She is oriented to person, place, and time. No distress.  HENT:  Head: Atraumatic.  Minimal nasal congestion, patient is using some nose drops Throat is pink  Eyes:  Conjugate gaze observed, no eye redness/discharge  Neck: Neck supple.  Cardiovascular: Normal rate and regular rhythm.  Pulmonary/Chest: No respiratory distress. She has no wheezes. She has no rales.  Lungs clear, symmetric breath sounds   Abdominal: She exhibits no distension.  Musculoskeletal: Normal range of motion.  Neurological: She is alert and oriented to person, place, and time.  Skin: Skin is warm and dry.  Nursing note and vitals reviewed.    UC Treatments / Results   Procedures Procedures (including critical care time)  Medications Ordered in UC Medications  acetaminophen (TYLENOL) tablet 650 mg (650 mg Oral Given 12/23/17 1519)    Final Clinical Impressions(s) / UC Diagnoses   Final diagnoses:  Acute sinusitis with symptoms > 10 days  Persistent cough     Discharge Instructions     Chest exam was clear today.  Chest xray was not done because of clear chest exam and decision to treat with antibiotics anyway, for persistent (2 weeks) phlegm production/sinus drainage.  Would reconsider if not improving in a few days.  Prescription for zithromax (antibiotic) and for benzonatate cough perles were sent to the pharmacy. Anticipate gradual improvement in cough, sinus drainage, fever and well being over the next several days.  Cough may take a couple weeks to subside.  Recheck for new fever >100.5, increasing phlegm production/nasal discharge, or if not starting to improve in a few days.      ED Prescriptions    Medication  Sig Dispense Auth. Provider   azithromycin (ZITHROMAX) 250 MG tablet Take 1 tablet (250 mg total) by mouth daily. Take first 2 tablets together, then 1 every day until finished. 6 tablet Wynona Luna, MD   benzonatate (TESSALON) 200 MG capsule Take 1 capsule (200 mg total) by mouth 3 (three) times daily as needed for cough. 30 capsule Wynona Luna, MD        Wynona Luna, MD 12/24/17 [phone]

## 2018-01-04 ENCOUNTER — Ambulatory Visit (INDEPENDENT_AMBULATORY_CARE_PROVIDER_SITE_OTHER): Payer: Medicare HMO

## 2018-01-04 DIAGNOSIS — R001 Bradycardia, unspecified: Secondary | ICD-10-CM | POA: Diagnosis not present

## 2018-01-04 DIAGNOSIS — I495 Sick sinus syndrome: Secondary | ICD-10-CM

## 2018-01-04 NOTE — Progress Notes (Signed)
Remote pacemaker transmission.   

## 2018-01-20 DIAGNOSIS — R69 Illness, unspecified: Secondary | ICD-10-CM | POA: Diagnosis not present

## 2018-02-07 ENCOUNTER — Emergency Department (HOSPITAL_COMMUNITY)
Admission: EM | Admit: 2018-02-07 | Discharge: 2018-02-08 | Disposition: A | Discharge status: Home / Self Care | Payer: Medicare HMO | Source: Home / Self Care | Attending: Emergency Medicine | Admitting: Emergency Medicine

## 2018-02-07 ENCOUNTER — Encounter (HOSPITAL_COMMUNITY): Payer: Self-pay | Admitting: Emergency Medicine

## 2018-02-07 DIAGNOSIS — Y93G1 Activity, food preparation and clean up: Secondary | ICD-10-CM | POA: Insufficient documentation

## 2018-02-07 DIAGNOSIS — Z85828 Personal history of other malignant neoplasm of skin: Secondary | ICD-10-CM | POA: Insufficient documentation

## 2018-02-07 DIAGNOSIS — Y929 Unspecified place or not applicable: Secondary | ICD-10-CM

## 2018-02-07 DIAGNOSIS — N183 Chronic kidney disease, stage 3 (moderate): Secondary | ICD-10-CM

## 2018-02-07 DIAGNOSIS — W260XXA Contact with knife, initial encounter: Secondary | ICD-10-CM

## 2018-02-07 DIAGNOSIS — Z87891 Personal history of nicotine dependence: Secondary | ICD-10-CM

## 2018-02-07 DIAGNOSIS — Y999 Unspecified external cause status: Secondary | ICD-10-CM

## 2018-02-07 DIAGNOSIS — I130 Hypertensive heart and chronic kidney disease with heart failure and stage 1 through stage 4 chronic kidney disease, or unspecified chronic kidney disease: Secondary | ICD-10-CM

## 2018-02-07 DIAGNOSIS — S61012A Laceration without foreign body of left thumb without damage to nail, initial encounter: Secondary | ICD-10-CM | POA: Diagnosis not present

## 2018-02-07 DIAGNOSIS — I5032 Chronic diastolic (congestive) heart failure: Secondary | ICD-10-CM | POA: Insufficient documentation

## 2018-02-07 DIAGNOSIS — Z23 Encounter for immunization: Secondary | ICD-10-CM

## 2018-02-07 DIAGNOSIS — S72142A Displaced intertrochanteric fracture of left femur, initial encounter for closed fracture: Secondary | ICD-10-CM | POA: Diagnosis not present

## 2018-02-07 NOTE — ED Triage Notes (Signed)
Pt was using new knives and "it slipped," leaving a laceration to the left thumb.  Bleeding if controlled at this time.

## 2018-02-08 MED ORDER — LIDOCAINE HCL (PF) 1 % IJ SOLN
5.0000 mL | Freq: Once | INTRAMUSCULAR | Status: DC
  Filled 2018-02-08: qty 5

## 2018-02-08 MED ORDER — TETANUS-DIPHTH-ACELL PERTUSSIS 5-2.5-18.5 LF-MCG/0.5 IM SUSP
0.5000 mL | Freq: Once | INTRAMUSCULAR | Status: AC
  Administered 2018-02-08: 0.5 mL via INTRAMUSCULAR
  Filled 2018-02-08: qty 0.5

## 2018-02-08 NOTE — Discharge Instructions (Addendum)
You may take Tylenol 1000 mg every 6 hours as needed for pain.  Please keep this wound clean and dry for the next 24 hours and then you may wash gently with warm soap and water, apply Neosporin over-the-counter and apply a Band-Aid to this area.  You should have your sutures removed in 1 week with your primary care physician.

## 2018-02-08 NOTE — ED Provider Notes (Signed)
TIME SEEN: 2:40 AM  CHIEF COMPLAINT: Left thumb laceration  HPI: Patient is an 85 year old right-hand-dominant female with history of hypertension, chronic kidney disease, CHF, paroxysmal atrial fibrillation who presents to the emergency department with left thumb laceration.  States that she cut herself with a knife while slicing ham yesterday.  Unsure of her last tetanus vaccination.  No other injury.  ROS: See HPI Constitutional: no fever  Eyes: no drainage  ENT: no runny nose   Cardiovascular:  no chest pain  Resp: no SOB  GI: no vomiting GU: no dysuria Integumentary: no rash  Allergy: no hives  Musculoskeletal: no leg swelling  Neurological: no slurred speech ROS otherwise negative  PAST MEDICAL HISTORY/PAST SURGICAL HISTORY:  Past Medical History:  Diagnosis Date  . Anemia    "I stay anemic"  . Arthritis   . Asthma    no problems recent  . Cataract   . Chronic diastolic CHF (congestive heart failure), NYHA class 2 ([place]) 03/19/2014   Echo 4/19: mod LVH, EF 55-60, no RWMA, Gr 1 DD, MAC, trivial MR, mod LAE, normal RVSF, PASP 19  . CKD (chronic kidney disease), stage III ([place])   . Depression   . Diverticulitis   . DVT (deep venous thrombosis) ([place])    from birth control, left groin  . Essential hypertension   . Fibromyalgia 10 y.a.  . GERD (gastroesophageal reflux disease)   . Gout   . Hx of cardiovascular stress test    Lexiscan Myoview (8/15): apical attenuation artifact, no ischemia, EF 69% - low risk.  Marland Kitchen Hypercholesteremia   . Odynophagia   . Other abnormal glucose   . PAF (paroxysmal atrial fibrillation) ([place])   . Personal history of other diseases of digestive system   . Pneumonia    hx of  . Sinus bradycardia   . Skin cancer 2014   skin R elbow & face  . Symptomatic bradycardia 06/2016    MEDICATIONS:  Prior to Admission medications   Medication Sig Start Date End Date Taking? Authorizing Provider  acetaminophen (TYLENOL) 500 MG tablet Take 2  tablets (1,000 mg total) by mouth every 8 (eight) hours as needed for mild pain or headache. 05/16/17   Hongalgi, Lenis Dickinson, MD  albuterol (PROVENTIL HFA;VENTOLIN HFA) 108 (90 BASE) MCG/ACT inhaler Inhale 1 puff into the lungs every 6 (six) hours as needed for wheezing or shortness of breath. 11/13/14   Dorothy Spark, MD  alendronate (FOSAMAX) 70 MG tablet Take 70 mg by mouth once a week. On Friday 02/13/14   [provider]  allopurinol (ZYLOPRIM) 100 MG tablet Take 1 tablet (100 mg total) by mouth daily. 04/01/16   Haney, Yetta Flock A, MD  amLODipine (NORVASC) 2.5 MG tablet Take 1 tablet (2.5 mg total) by mouth daily. 07/28/17   Dorothy Spark, MD  aspirin EC 81 MG tablet Take 81 mg by mouth daily.     [provider]  azithromycin (ZITHROMAX) 250 MG tablet Take 1 tablet (250 mg total) by mouth daily. Take first 2 tablets together, then 1 every day until finished. 12/23/17   Wynona Luna, MD  benzonatate (TESSALON) 200 MG capsule Take 1 capsule (200 mg total) by mouth 3 (three) times daily as needed for cough. 12/23/17   Wynona Luna, MD  budesonide-formoterol [facility]) 80-4.5 MCG/ACT inhaler Inhale 2 puffs into the lungs 2 (two) times daily. 05/20/17   Chesley Mires, MD  cholecalciferol (VITAMIN D) 1000 units tablet Take 1,000 Units by mouth  daily.     [provider]  colchicine 0.6 MG tablet Take 1 tablet by mouth every other day. 07/26/16   [provider]  Ferrous Sulfate 28 MG TABS Take 28 mg by mouth daily.     [provider]  Magnesium Oxide 200 MG TABS Take 1 tablet (200 mg total) by mouth 2 (two) times daily. 06/13/17   Dorothy Spark, MD  montelukast (SINGULAIR) 10 MG tablet Take 10 mg by mouth at bedtime.      [provider]  pravastatin (PRAVACHOL) 20 MG tablet Take 20 mg by mouth at bedtime.  09/17/14   [provider]  ranitidine (ZANTAC) 150 MG tablet Take 1 tablet by mouth at bedtime. 09/24/16   [provider]  sertraline (ZOLOFT) 50 MG tablet Take 50 mg by mouth daily.  03/15/12   [provider]  traMADol (ULTRAM) 50 MG tablet Take 1 tablet (50 mg total) by mouth every 12 (twelve) hours as needed for moderate pain. 05/16/17   Hongalgi, Lenis Dickinson, MD    ALLERGIES:  Allergies  Allergen Reactions  . Penicillins Rash  . Amoxicillin Other (See Comments)    Very weak Has patient had a PCN reaction causing immediate rash, facial/tongue/throat swelling, SOB or lightheadedness with hypotension: no Has patient had a PCN reaction causing severe rash involving mucus membranes or skin necrosis: no Has patient had a PCN reaction that required hospitalization: no Has patient had a PCN reaction occurring within the last 10 years: no If all of the above answers are "NO", then may proceed with Cephalosporin use.   . Diltiazem Hcl Other (See Comments)    Reaction unknown  . Sulfonamide Derivatives Other (See Comments)    Reaction unknown  . Zetia [Ezetimibe] Other (See Comments)    Weakness    SOCIAL HISTORY:  Social History   Tobacco Use  . Smoking status: Former Smoker    Years: 10.00    Types: Cigarettes    Last attempt to quit: 02/08/1973    Years since quitting: 45.0  . Smokeless tobacco: Never Used  Substance Use Topics  . Alcohol use: No    FAMILY HISTORY: Family History  Problem Relation Age of Onset  . Heart disease Mother   . Hypertension Mother   . Fainting Mother   . Arrhythmia Mother   . Diabetes Father   . Stroke Father   . Hypertension Father   . Cancer Brother   . Sudden death Brother   . Heart attack Brother   . Cancer Sister   . Diabetes Sister   . Asthma Sister     EXAM: BP (!) 179/81 (BP Location: Right Arm)   Pulse 60   Temp 98.3 F (36.8 C) (Oral)   Resp 18   SpO2 96%  CONSTITUTIONAL: Alert and oriented and responds appropriately to questions. Well-appearing; well-nourished HEAD: Normocephalic EYES: Conjunctivae clear, pupils appear  equal, EOMI ENT: normal nose; moist mucous membranes NECK: Supple, no meningismus, no nuchal rigidity, no LAD  CARD: RRR; S1 and S2 appreciated; no murmurs, no clicks, no rubs, no gallops RESP: Normal chest excursion without splinting or tachypnea; breath sounds clear and equal bilaterally; no wheezes, no rhonchi, no rales, no hypoxia or respiratory distress, speaking full sentences ABD/GI: Normal bowel sounds; non-distended; soft, non-tender, no rebound, no guarding, no peritoneal signs, no hepatosplenomegaly BACK:  The back appears normal and is non-tender to palpation, there is no CVA tenderness EXT: Normal ROM in all joints; non-tender  to palpation; no edema; normal capillary refill; no cyanosis, no calf tenderness or swelling; 2+ left radial pulse, normal capillary refill in the left thumb, normal sensation throughout the left thumb with normal range of motion SKIN: Normal color for age and race; warm; no rash, 2 cm laceration to the distal left thumb without tendon involvement or nailbed involvement NEURO: Moves all extremities equally PSYCH: The patient's mood and manner are appropriate. Grooming and personal hygiene are appropriate.  MEDICAL DECISION MAKING: Patient here with left thumb laceration.  No tendon involvement.  She has normal sensation and strong pulses with normal capillary refill.  No foreign body present.  Tetanus vaccination updated.  Laceration repaired.  She will follow-up with her primary care physician for suture removal.  Discussed wound care instructions and return precautions.  At this time, I do not feel there is any life-threatening condition present. I have reviewed and discussed all results (EKG, imaging, lab, urine as appropriate) and exam findings with patient/family. I have reviewed nursing notes and appropriate previous records.  I feel the patient is safe to be discharged home without further emergent workup and can continue workup as an outpatient as needed.  Discussed usual and customary return precautions. Patient/family verbalize understanding and are comfortable with this plan.  Outpatient follow-up has been provided as needed. All questions have been answered.     LACERATION REPAIR Performed by: Pryor Curia Authorized by: Pryor Curia Consent: Verbal consent obtained. Risks and benefits: risks, benefits and alternatives were discussed Consent given by: patient Patient identity confirmed: provided demographic data Prepped and Draped in normal sterile fashion Wound explored  Laceration Location: left thumb   Laceration Length: 2 cm  No Foreign Bodies seen or palpated  Anesthesia: local infiltration  Local anesthetic: lidocaine 1 % without epinephrine  Anesthetic total: 4 ml  Irrigation method: syringe Amount of cleaning: standard  Skin closure: Simple  Number of sutures: 3  Technique: Area anesthetized using lidocaine 1% without lidocaine. Wound irrigated copiously with sterile saline. Wound then cleaned with Betadine and draped in sterile fashion. Wound closed using 3 simple interrupted sutures with 4-0 Prolene.  Bacitracin and sterile dressing applied. Good wound approximation and hemostasis achieved.   Patient tolerance: Patient tolerated the procedure well with no immediate complications.    Izic Stfort, Delice Bison, DO 02/08/18 [phone]

## 2018-02-09 ENCOUNTER — Other Ambulatory Visit (HOSPITAL_COMMUNITY): Payer: Self-pay | Admitting: Orthopedic Surgery

## 2018-02-09 ENCOUNTER — Inpatient Hospital Stay (HOSPITAL_COMMUNITY)
Admission: EM | Admit: 2018-02-09 | Discharge: 2018-02-16 | DRG: 481 | Disposition: A | Discharge status: Skilled Nursing Facility | Payer: Medicare HMO | Attending: Internal Medicine | Admitting: Internal Medicine

## 2018-02-09 ENCOUNTER — Emergency Department (HOSPITAL_COMMUNITY): Payer: Medicare HMO

## 2018-02-09 ENCOUNTER — Other Ambulatory Visit: Payer: Self-pay

## 2018-02-09 DIAGNOSIS — Z950 Presence of cardiac pacemaker: Secondary | ICD-10-CM | POA: Diagnosis not present

## 2018-02-09 DIAGNOSIS — D649 Anemia, unspecified: Secondary | ICD-10-CM | POA: Diagnosis present

## 2018-02-09 DIAGNOSIS — D539 Nutritional anemia, unspecified: Secondary | ICD-10-CM | POA: Diagnosis present

## 2018-02-09 DIAGNOSIS — M545 Low back pain: Secondary | ICD-10-CM | POA: Diagnosis not present

## 2018-02-09 DIAGNOSIS — J984 Other disorders of lung: Secondary | ICD-10-CM | POA: Diagnosis not present

## 2018-02-09 DIAGNOSIS — I5032 Chronic diastolic (congestive) heart failure: Secondary | ICD-10-CM | POA: Diagnosis present

## 2018-02-09 DIAGNOSIS — Z8249 Family history of ischemic heart disease and other diseases of the circulatory system: Secondary | ICD-10-CM

## 2018-02-09 DIAGNOSIS — Z85828 Personal history of other malignant neoplasm of skin: Secondary | ICD-10-CM

## 2018-02-09 DIAGNOSIS — Z881 Allergy status to other antibiotic agents status: Secondary | ICD-10-CM

## 2018-02-09 DIAGNOSIS — J45909 Unspecified asthma, uncomplicated: Secondary | ICD-10-CM | POA: Diagnosis not present

## 2018-02-09 DIAGNOSIS — R339 Retention of urine, unspecified: Secondary | ICD-10-CM | POA: Diagnosis not present

## 2018-02-09 DIAGNOSIS — Y92000 Kitchen of unspecified non-institutional (private) residence as  the place of occurrence of the external cause: Secondary | ICD-10-CM

## 2018-02-09 DIAGNOSIS — K219 Gastro-esophageal reflux disease without esophagitis: Secondary | ICD-10-CM | POA: Diagnosis present

## 2018-02-09 DIAGNOSIS — M25561 Pain in right knee: Secondary | ICD-10-CM | POA: Diagnosis not present

## 2018-02-09 DIAGNOSIS — S72142A Displaced intertrochanteric fracture of left femur, initial encounter for closed fracture: Secondary | ICD-10-CM

## 2018-02-09 DIAGNOSIS — E785 Hyperlipidemia, unspecified: Secondary | ICD-10-CM | POA: Diagnosis present

## 2018-02-09 DIAGNOSIS — R69 Illness, unspecified: Secondary | ICD-10-CM | POA: Diagnosis not present

## 2018-02-09 DIAGNOSIS — Z833 Family history of diabetes mellitus: Secondary | ICD-10-CM

## 2018-02-09 DIAGNOSIS — I110 Hypertensive heart disease with heart failure: Secondary | ICD-10-CM | POA: Diagnosis not present

## 2018-02-09 DIAGNOSIS — I10 Essential (primary) hypertension: Secondary | ICD-10-CM | POA: Diagnosis not present

## 2018-02-09 DIAGNOSIS — F05 Delirium due to known physiological condition: Secondary | ICD-10-CM | POA: Diagnosis present

## 2018-02-09 DIAGNOSIS — Z86718 Personal history of other venous thrombosis and embolism: Secondary | ICD-10-CM | POA: Diagnosis not present

## 2018-02-09 DIAGNOSIS — M109 Gout, unspecified: Secondary | ICD-10-CM | POA: Diagnosis present

## 2018-02-09 DIAGNOSIS — I444 Left anterior fascicular block: Secondary | ICD-10-CM | POA: Diagnosis not present

## 2018-02-09 DIAGNOSIS — Z7982 Long term (current) use of aspirin: Secondary | ICD-10-CM

## 2018-02-09 DIAGNOSIS — M797 Fibromyalgia: Secondary | ICD-10-CM | POA: Diagnosis not present

## 2018-02-09 DIAGNOSIS — Z823 Family history of stroke: Secondary | ICD-10-CM

## 2018-02-09 DIAGNOSIS — D696 Thrombocytopenia, unspecified: Secondary | ICD-10-CM | POA: Diagnosis present

## 2018-02-09 DIAGNOSIS — Z23 Encounter for immunization: Secondary | ICD-10-CM | POA: Diagnosis present

## 2018-02-09 DIAGNOSIS — Z7951 Long term (current) use of inhaled steroids: Secondary | ICD-10-CM

## 2018-02-09 DIAGNOSIS — M25552 Pain in left hip: Secondary | ICD-10-CM | POA: Diagnosis not present

## 2018-02-09 DIAGNOSIS — M1990 Unspecified osteoarthritis, unspecified site: Secondary | ICD-10-CM | POA: Diagnosis present

## 2018-02-09 DIAGNOSIS — W1809XA Striking against other object with subsequent fall, initial encounter: Secondary | ICD-10-CM | POA: Diagnosis present

## 2018-02-09 DIAGNOSIS — S8991XA Unspecified injury of right lower leg, initial encounter: Secondary | ICD-10-CM | POA: Diagnosis not present

## 2018-02-09 DIAGNOSIS — S3992XA Unspecified injury of lower back, initial encounter: Secondary | ICD-10-CM | POA: Diagnosis not present

## 2018-02-09 DIAGNOSIS — Z888 Allergy status to other drugs, medicaments and biological substances status: Secondary | ICD-10-CM

## 2018-02-09 DIAGNOSIS — I480 Paroxysmal atrial fibrillation: Secondary | ICD-10-CM | POA: Diagnosis present

## 2018-02-09 DIAGNOSIS — S72002A Fracture of unspecified part of neck of left femur, initial encounter for closed fracture: Secondary | ICD-10-CM | POA: Diagnosis not present

## 2018-02-09 DIAGNOSIS — D62 Acute posthemorrhagic anemia: Secondary | ICD-10-CM | POA: Diagnosis not present

## 2018-02-09 DIAGNOSIS — E7800 Pure hypercholesterolemia, unspecified: Secondary | ICD-10-CM | POA: Diagnosis present

## 2018-02-09 DIAGNOSIS — Z825 Family history of asthma and other chronic lower respiratory diseases: Secondary | ICD-10-CM

## 2018-02-09 DIAGNOSIS — R52 Pain, unspecified: Secondary | ICD-10-CM

## 2018-02-09 DIAGNOSIS — S72042A Displaced fracture of base of neck of left femur, initial encounter for closed fracture: Secondary | ICD-10-CM | POA: Diagnosis not present

## 2018-02-09 DIAGNOSIS — M2550 Pain in unspecified joint: Secondary | ICD-10-CM | POA: Diagnosis not present

## 2018-02-09 DIAGNOSIS — Z419 Encounter for procedure for purposes other than remedying health state, unspecified: Secondary | ICD-10-CM

## 2018-02-09 DIAGNOSIS — R5381 Other malaise: Secondary | ICD-10-CM | POA: Diagnosis not present

## 2018-02-09 DIAGNOSIS — N183 Chronic kidney disease, stage 3 (moderate): Secondary | ICD-10-CM | POA: Diagnosis present

## 2018-02-09 DIAGNOSIS — I130 Hypertensive heart and chronic kidney disease with heart failure and stage 1 through stage 4 chronic kidney disease, or unspecified chronic kidney disease: Secondary | ICD-10-CM | POA: Diagnosis present

## 2018-02-09 DIAGNOSIS — Z882 Allergy status to sulfonamides status: Secondary | ICD-10-CM

## 2018-02-09 DIAGNOSIS — W19XXXA Unspecified fall, initial encounter: Secondary | ICD-10-CM | POA: Diagnosis not present

## 2018-02-09 DIAGNOSIS — Z7401 Bed confinement status: Secondary | ICD-10-CM | POA: Diagnosis not present

## 2018-02-09 DIAGNOSIS — Z880 Allergy status to penicillin: Secondary | ICD-10-CM

## 2018-02-09 DIAGNOSIS — F329 Major depressive disorder, single episode, unspecified: Secondary | ICD-10-CM | POA: Diagnosis present

## 2018-02-09 DIAGNOSIS — Z79899 Other long term (current) drug therapy: Secondary | ICD-10-CM | POA: Diagnosis not present

## 2018-02-09 DIAGNOSIS — F32A Depression, unspecified: Secondary | ICD-10-CM | POA: Diagnosis present

## 2018-02-09 DIAGNOSIS — S72142D Displaced intertrochanteric fracture of left femur, subsequent encounter for closed fracture with routine healing: Secondary | ICD-10-CM | POA: Diagnosis not present

## 2018-02-09 DIAGNOSIS — M6281 Muscle weakness (generalized): Secondary | ICD-10-CM | POA: Diagnosis not present

## 2018-02-09 DIAGNOSIS — R41841 Cognitive communication deficit: Secondary | ICD-10-CM | POA: Diagnosis not present

## 2018-02-09 DIAGNOSIS — S72442A Displaced fracture of lower epiphysis (separation) of left femur, initial encounter for closed fracture: Secondary | ICD-10-CM

## 2018-02-09 DIAGNOSIS — Z4789 Encounter for other orthopedic aftercare: Secondary | ICD-10-CM | POA: Diagnosis not present

## 2018-02-09 DIAGNOSIS — R2689 Other abnormalities of gait and mobility: Secondary | ICD-10-CM | POA: Diagnosis not present

## 2018-02-09 DIAGNOSIS — S72009A Fracture of unspecified part of neck of unspecified femur, initial encounter for closed fracture: Secondary | ICD-10-CM

## 2018-02-09 DIAGNOSIS — Z87891 Personal history of nicotine dependence: Secondary | ICD-10-CM

## 2018-02-09 DIAGNOSIS — Z96652 Presence of left artificial knee joint: Secondary | ICD-10-CM | POA: Diagnosis present

## 2018-02-09 DIAGNOSIS — R278 Other lack of coordination: Secondary | ICD-10-CM | POA: Diagnosis not present

## 2018-02-09 DIAGNOSIS — R2681 Unsteadiness on feet: Secondary | ICD-10-CM | POA: Diagnosis not present

## 2018-02-09 HISTORY — DX: Presence of cardiac pacemaker: Z95.0

## 2018-02-09 HISTORY — DX: Other complications of anesthesia, initial encounter: T88.59XA

## 2018-02-09 HISTORY — DX: Adverse effect of unspecified anesthetic, initial encounter: T41.45XA

## 2018-02-09 LAB — CBC WITH DIFFERENTIAL/PLATELET
Abs Immature Granulocytes: 0.05 10*3/uL (ref 0.00–0.07)
Basophils Absolute: 0 10*3/uL (ref 0.0–0.1)
Basophils Relative: 0 %
Eosinophils Absolute: 0 10*3/uL (ref 0.0–0.5)
Eosinophils Relative: 1 %
HCT: 30.8 % — ABNORMAL LOW (ref 36.0–46.0)
Hemoglobin: 9.8 g/dL — ABNORMAL LOW (ref 12.0–15.0)
IMMATURE GRANULOCYTES: 1 %
Lymphocytes Relative: 12 %
Lymphs Abs: 0.6 10*3/uL — ABNORMAL LOW (ref 0.7–4.0)
MCH: 31.7 pg (ref 26.0–34.0)
MCHC: 31.8 g/dL (ref 30.0–36.0)
MCV: 99.7 fL (ref 80.0–100.0)
Monocytes Absolute: 0.3 10*3/uL (ref 0.1–1.0)
Monocytes Relative: 5 %
NEUTROS PCT: 81 %
Neutro Abs: 4 10*3/uL (ref 1.7–7.7)
PLATELETS: 123 10*3/uL — AB (ref 150–400)
RBC: 3.09 MIL/uL — ABNORMAL LOW (ref 3.87–5.11)
RDW: 14.1 % (ref 11.5–15.5)
WBC: 4.9 10*3/uL (ref 4.0–10.5)
nRBC: 0 % (ref 0.0–0.2)

## 2018-02-09 LAB — I-STAT CHEM 8, ED
BUN: 20 mg/dL (ref 8–23)
Calcium, Ion: 1.2 mmol/L (ref 1.15–1.40)
Chloride: 108 mmol/L (ref 98–111)
Creatinine, Ser: 1.1 mg/dL — ABNORMAL HIGH (ref 0.44–1.00)
Glucose, Bld: 140 mg/dL — ABNORMAL HIGH (ref 70–99)
HCT: 30 % — ABNORMAL LOW (ref 36.0–46.0)
Hemoglobin: 10.2 g/dL — ABNORMAL LOW (ref 12.0–15.0)
POTASSIUM: 4.1 mmol/L (ref 3.5–5.1)
Sodium: 141 mmol/L (ref 135–145)
TCO2: 22 mmol/L (ref 22–32)

## 2018-02-09 MED ORDER — SODIUM CHLORIDE 0.9 % IV SOLN
INTRAVENOUS | Status: AC
  Administered 2018-02-09: 21:00:00 via INTRAVENOUS

## 2018-02-09 MED ORDER — ALLOPURINOL 100 MG PO TABS
100.0000 mg | ORAL_TABLET | Freq: Every day | ORAL | Status: DC
  Administered 2018-02-09 – 2018-02-16 (×7): 100 mg via ORAL
  Filled 2018-02-09 (×7): qty 1

## 2018-02-09 MED ORDER — ONDANSETRON HCL 4 MG/2ML IJ SOLN
4.0000 mg | Freq: Once | INTRAMUSCULAR | Status: AC
  Administered 2018-02-09: 4 mg via INTRAVENOUS
  Filled 2018-02-09: qty 2

## 2018-02-09 MED ORDER — MOMETASONE FURO-FORMOTEROL FUM 100-5 MCG/ACT IN AERO
2.0000 | INHALATION_SPRAY | Freq: Two times a day (BID) | RESPIRATORY_TRACT | Status: DC
  Administered 2018-02-10 – 2018-02-15 (×11): 2 via RESPIRATORY_TRACT
  Filled 2018-02-09 (×2): qty 8.8

## 2018-02-09 MED ORDER — ONDANSETRON HCL 4 MG PO TABS: 4.0000 mg | ORAL_TABLET | Freq: Three times a day (TID) | ORAL | Status: DC | PRN

## 2018-02-09 MED ORDER — VITAMIN D 25 MCG (1000 UNIT) PO TABS
1000.0000 [IU] | ORAL_TABLET | Freq: Every day | ORAL | Status: DC
  Administered 2018-02-09 – 2018-02-16 (×7): 1000 [IU] via ORAL
  Filled 2018-02-09 (×7): qty 1

## 2018-02-09 MED ORDER — FENTANYL CITRATE (PF) 100 MCG/2ML IJ SOLN
50.0000 ug | Freq: Once | INTRAMUSCULAR | Status: AC
  Administered 2018-02-09: 50 ug via INTRAVENOUS
  Filled 2018-02-09: qty 2

## 2018-02-09 MED ORDER — HYDROMORPHONE HCL 1 MG/ML IJ SOLN
1.0000 mg | Freq: Once | INTRAMUSCULAR | Status: DC
  Administered 2018-02-09: 1 mg via INTRAVENOUS
  Filled 2018-02-09: qty 1

## 2018-02-09 MED ORDER — SERTRALINE HCL 50 MG PO TABS
50.0000 mg | ORAL_TABLET | Freq: Every day | ORAL | Status: DC
  Administered 2018-02-11 – 2018-02-16 (×6): 50 mg via ORAL
  Filled 2018-02-09 (×6): qty 1

## 2018-02-09 MED ORDER — MORPHINE SULFATE (PF) 2 MG/ML IV SOLN
0.5000 mg | INTRAVENOUS | Status: DC | PRN
  Administered 2018-02-10 – 2018-02-13 (×4): 0.5 mg via INTRAVENOUS
  Filled 2018-02-09 (×4): qty 1

## 2018-02-09 MED ORDER — ASPIRIN EC 81 MG PO TBEC: 81.0000 mg | DELAYED_RELEASE_TABLET | Freq: Every day | ORAL | Status: DC

## 2018-02-09 MED ORDER — PRAVASTATIN SODIUM 10 MG PO TABS
20.0000 mg | ORAL_TABLET | Freq: Every day | ORAL | Status: DC
  Administered 2018-02-09 – 2018-02-15 (×7): 20 mg via ORAL
  Filled 2018-02-09 (×7): qty 2

## 2018-02-09 MED ORDER — HYDROCODONE-ACETAMINOPHEN 5-325 MG PO TABS
1.0000 | ORAL_TABLET | Freq: Four times a day (QID) | ORAL | Status: DC | PRN
  Administered 2018-02-09: 2 via ORAL
  Administered 2018-02-10: 1 via ORAL
  Administered 2018-02-10 – 2018-02-11 (×2): 2 via ORAL
  Administered 2018-02-11: 1 via ORAL
  Administered 2018-02-14: 2 via ORAL
  Administered 2018-02-14: 1 via ORAL
  Administered 2018-02-15 (×2): 2 via ORAL
  Administered 2018-02-15: 1 via ORAL
  Administered 2018-02-16: 2 via ORAL
  Filled 2018-02-09: qty 2
  Filled 2018-02-09: qty 1
  Filled 2018-02-09: qty 2
  Filled 2018-02-09 (×2): qty 1
  Filled 2018-02-09 (×5): qty 2
  Filled 2018-02-09: qty 1
  Filled 2018-02-09: qty 2

## 2018-02-09 MED ORDER — HYDROMORPHONE HCL 1 MG/ML IJ SOLN
1.0000 mg | Freq: Once | INTRAMUSCULAR | Status: AC
  Administered 2018-02-09: 1 mg via INTRAVENOUS
  Filled 2018-02-09: qty 1

## 2018-02-09 MED ORDER — FERROUS SULFATE 325 (65 FE) MG PO TABS
325.0000 mg | ORAL_TABLET | Freq: Every day | ORAL | Status: DC
  Administered 2018-02-11 – 2018-02-16 (×6): 325 mg via ORAL
  Filled 2018-02-09 (×6): qty 1

## 2018-02-09 MED ORDER — AMLODIPINE BESYLATE 2.5 MG PO TABS
2.5000 mg | ORAL_TABLET | Freq: Every day | ORAL | Status: DC
  Administered 2018-02-09 – 2018-02-16 (×6): 2.5 mg via ORAL
  Filled 2018-02-09 (×7): qty 1

## 2018-02-09 MED ORDER — MONTELUKAST SODIUM 10 MG PO TABS
10.0000 mg | ORAL_TABLET | Freq: Every day | ORAL | Status: DC
  Administered 2018-02-09 – 2018-02-15 (×7): 10 mg via ORAL
  Filled 2018-02-09 (×7): qty 1

## 2018-02-09 MED ORDER — FAMOTIDINE 20 MG PO TABS
20.0000 mg | ORAL_TABLET | Freq: Two times a day (BID) | ORAL | Status: DC
  Administered 2018-02-09 – 2018-02-16 (×13): 20 mg via ORAL
  Filled 2018-02-09 (×13): qty 1

## 2018-02-09 MED ORDER — ALBUTEROL SULFATE (2.5 MG/3ML) 0.083% IN NEBU: 2.5000 mg | INHALATION_SOLUTION | Freq: Four times a day (QID) | RESPIRATORY_TRACT | Status: DC | PRN

## 2018-02-09 NOTE — ED Notes (Signed)
Ortho at the bedside.

## 2018-02-09 NOTE — ED Triage Notes (Signed)
Pt presents to ED with EMS after mechanical fall this morning, t ripping over a box. Did not hit head, not on blood thinners, no LOC. Pt c/o lower back and L hip pain. Outward rotation and shortening present. 150 mcg Fentanyl given PTA.

## 2018-02-09 NOTE — Consult Note (Signed)
Reason for Consult: Displaced L hip intratrochanteric fracture  Referring Physician: Dr. Nat Christen   HPI: Patient is an 85 year old female, with PMH significant for CHF, hx of DVT, Afib, and CKD, that presented to [facility] ED via EMS after sustaining a fall at home this morning around 9:30am.  She reports that she turned in the kitchen, tripped over a box with her foot and fell directly on her L hip.  She reports that she was unable to bear weight due to the pain.  911 was called and the patient was then transported to the hospital.  She describes a sharp stabbing sensation.  She notes that NWB and immobilization help to alleviate her symptoms.  Radiographs of the hip were obtained which demonstrate a displaced L hip intratrochanteric fx.  Dr. Wylene Simmer was then consulted.  The patient resides with her daughter.  She denies any hx of diabetes, thryoid disease, autoimmune disorders, CA, and she is a nonsmoker.  She does take an 81 mg ASA daily.  Her daughter and son are at bedside.  Siblings reports that after her L TKR she did require a transfusion.  Past Medical History:  Diagnosis Date  . Anemia    "I stay anemic"  . Arthritis   . Asthma    no problems recent  . Cataract   . Chronic diastolic CHF (congestive heart failure), NYHA class 2 ([place]) 03/19/2014   Echo 4/19: mod LVH, EF 55-60, no RWMA, Gr 1 DD, MAC, trivial MR, mod LAE, normal RVSF, PASP 19  . CKD (chronic kidney disease), stage III ([place])   . Depression   . Diverticulitis   . DVT (deep venous thrombosis) ([place])    from birth control, left groin  . Essential hypertension   . Fibromyalgia 10 y.a.  . GERD (gastroesophageal reflux disease)   . Gout   . Hx of cardiovascular stress test    Lexiscan Myoview (8/15): apical attenuation artifact, no ischemia, EF 69% - low risk.  Marland Kitchen Hypercholesteremia   . Odynophagia   . Other abnormal glucose   . PAF (paroxysmal atrial fibrillation) ([place])   . Personal history of other diseases of  digestive system   . Pneumonia    hx of  . Sinus bradycardia   . Skin cancer 2014   skin R elbow & face  . Symptomatic bradycardia 06/2016    Past Surgical History:  Procedure Laterality Date  . ABDOMINAL HYSTERECTOMY     partial  . APPENDECTOMY  ~55years ago  . CHOLECYSTECTOMY  10/2005   Dr. Effie Shy  . COLONOSCOPY W/ BIOPSIES  2005, 2011   Dr. Bing Plume, "balloon inside for last one at [facility]"  . IR THORACENTESIS ASP PLEURAL SPACE W/IMG GUIDE  06/13/2017  . KNEE ARTHROSCOPY Right   . LAMINECTOMY  05/2007   foraminotomy, L4-5 laminectomy  . LARYNGOSCOPY / BRONCHOSCOPY / ESOPHAGOSCOPY  2010   Dr. Benjamine Mola  . PACEMAKER IMPLANT N/A 07/01/2016   Procedure: Pacemaker Implant;  Surgeon: Evans Lance, MD;  Location: [facility];  Service: Cardiovascular;  Laterality: N/A;  . SKIN CANCER EXCISION Right ~2005   r elbow  . TOTAL KNEE ARTHROPLASTY Left 04/04/2013   Procedure: LEFT TOTAL KNEE ARTHROPLASTY;  Surgeon: Tobi Bastos, MD;  Location: [facility];  Service: Orthopedics;  Laterality: Left;  . TUBAL LIGATION    . TUMOR REMOVAL Right ~ 20 years ago   tumor in between toes    Family History  Problem  Relation Age of Onset  . Heart disease Mother   . Hypertension Mother   . Fainting Mother   . Arrhythmia Mother   . Diabetes Father   . Stroke Father   . Hypertension Father   . Cancer Brother   . Sudden death Brother   . Heart attack Brother   . Cancer Sister   . Diabetes Sister   . Asthma Sister     Social History:  reports that she quit smoking about 45 years ago. Her smoking use included cigarettes. She quit after 10.00 years of use. She has never used smokeless tobacco. She reports that she does not drink alcohol or use drugs.  Allergies:  Allergies  Allergen Reactions  . Penicillins Rash    DID THE REACTION INVOLVE: Swelling of the face/tongue/throat, SOB, or low BP? Unknown Sudden or severe rash/hives, skin peeling, or the inside of the mouth or nose?  Unknown Did it require medical treatment? Unknown When did it last happen?unknown If all above answers are "NO", may proceed with cephalosporin use.   . Amoxicillin Other (See Comments)    Very weak Has patient had a PCN reaction causing immediate rash, facial/tongue/throat swelling, SOB or lightheadedness with hypotension: no Has patient had a PCN reaction causing severe rash involving mucus membranes or skin necrosis: no Has patient had a PCN reaction that required hospitalization: no Has patient had a PCN reaction occurring within the last 10 years: no If all of the above answers are "NO", then may proceed with Cephalosporin use.   . Diltiazem Hcl Other (See Comments)    Reaction unknown  . Sulfonamide Derivatives Other (See Comments)    Reaction unknown  . Zetia [Ezetimibe] Other (See Comments)    Weakness    Medications: I have reviewed the patient's current medications.  Results for orders placed or performed during the hospital encounter of 02/09/18 (from the past 48 hour(s))  CBC with Differential     Status: Abnormal   Collection Time: 02/09/18  3:30 PM  Result Value Ref Range   WBC 4.9 4.0 - 10.5 K/uL   RBC 3.09 (L) 3.87 - 5.11 MIL/uL   Hemoglobin 9.8 (L) 12.0 - 15.0 g/dL   HCT 30.8 (L) 36.0 - 46.0 %   MCV 99.7 80.0 - 100.0 fL   MCH 31.7 26.0 - 34.0 pg   MCHC 31.8 30.0 - 36.0 g/dL   RDW 14.1 11.5 - 15.5 %   Platelets 123 (L) 150 - 400 K/uL    Comment: REPEATED TO VERIFY   nRBC 0.0 0.0 - 0.2 %   Neutrophils Relative % 81 %   Neutro Abs 4.0 1.7 - 7.7 K/uL   Lymphocytes Relative 12 %   Lymphs Abs 0.6 (L) 0.7 - 4.0 K/uL   Monocytes Relative 5 %   Monocytes Absolute 0.3 0.1 - 1.0 K/uL   Eosinophils Relative 1 %   Eosinophils Absolute 0.0 0.0 - 0.5 K/uL   Basophils Relative 0 %   Basophils Absolute 0.0 0.0 - 0.1 K/uL   Immature Granulocytes 1 %   Abs Immature Granulocytes 0.05 0.00 - 0.07 K/uL    Comment: Performed at [facility], [address] [place], [place] [address]  I-stat Chem 8, ED     Status: Abnormal   Collection Time: 02/09/18  3:44 PM  Result Value Ref Range   Sodium 141 135 - 145 mmol/L   Potassium 4.1 3.5 - 5.1 mmol/L   Chloride 108 98 - 111   mmol/L   BUN 20 8 - 23 mg/dL   Creatinine, Ser 1.10 (H) 0.44 - 1.00 mg/dL   Glucose, Bld 140 (H) 70 - 99 mg/dL   Calcium, Ion 1.20 1.15 - 1.40 mmol/L   TCO2 22 22 - 32 mmol/L   Hemoglobin 10.2 (L) 12.0 - 15.0 g/dL   HCT 30.0 (L) 36.0 - 46.0 %    Dg Chest 1 View  Result Date: 02/09/2018 CLINICAL DATA:  Left hip fracture. EXAM: CHEST  1 VIEW COMPARISON:  Radiographs of September 28, 2017. FINDINGS: Stable cardiomegaly. Left-sided pacemaker is unchanged in position. No pneumothorax or pleural effusion is noted. Right lung is clear. Stable left midlung and basilar scarring is noted. No acute pulmonary disease is noted. Bony thorax is unremarkable. IMPRESSION: No acute cardiopulmonary abnormality seen. Electronically Signed   By: Marijo Conception, M.D.   On: 02/09/2018 15:27   Dg Lumbar Spine Complete  Result Date: 02/09/2018 CLINICAL DATA:  Left hip pain after fall. EXAM: LUMBAR SPINE - COMPLETE 4+ VIEW COMPARISON:  CT scan of November 04, 2017. FINDINGS: No fracture or spondylolisthesis is noted. Mild degenerative disc disease is noted at L1-2 with anterior osteophyte formation. Diffuse osteopenia is noted. Remaining disc spaces are unremarkable. Atherosclerosis of abdominal aorta is noted. IMPRESSION: Mild degenerative disc disease is noted at L1-2. No acute abnormality seen in the lumbar spine. Aortic Atherosclerosis (ICD10-I70.0). Electronically Signed   By: Marijo Conception, M.D.   On: 02/09/2018 15:22   Dg Knee Complete 4 Views Right  Result Date: 02/09/2018 CLINICAL DATA:  Right knee pain after fall today. EXAM: RIGHT KNEE - COMPLETE 4+ VIEW COMPARISON:  Radiographs of May 01, 2010. FINDINGS: No evidence of fracture, dislocation, or joint effusion. Moderate narrowing of medial joint  space is noted with osteophyte formation. Soft tissues are unremarkable. IMPRESSION: Moderate degenerative joint disease is noted medially. No acute abnormality seen in the right knee. Electronically Signed   By: Marijo Conception, M.D.   On: 02/09/2018 15:24   Dg Hip Unilat W Or Wo Pelvis 2-3 Views Left  Result Date: 02/09/2018 CLINICAL DATA:  Left hip pain after fall today. EXAM: DG HIP (WITH OR WITHOUT PELVIS) 2-3V LEFT COMPARISON:  None. FINDINGS: Moderately displaced and comminuted fracture is seen involving the intertrochanteric region of proximal left femur. Right hip joint appears normal. IMPRESSION: Moderately displaced and comminuted intertrochanteric proximal left femoral fracture. Electronically Signed   By: Marijo Conception, M.D.   On: 02/09/2018 15:20    ROS:   Gen: Denies fever, chills, weight change, fatigue, night sweats MSK: C/o of L hip pain Neuro: Denies headache, numbness, weakness, slurred speech, loss of memory or consciousness Derm: Denies rash, dry skin, scaling or peeling skin change HEENT: Denies blurred vision, double vision, neck stiffness, dysphagia PULM: Denies shortness of breath, cough, sputum production, hemoptysis, wheezing CV: Denies chest pain, edema, orthopnea, palpitations GI: Denies abdominal pain, nausea, vomiting, diarrhea, hematochezia, melena, constipation  Endocrine: Denies hot or cold intolerance, polyuria, polyphagia or appetite change Heme: Denies easy bruising, bleeding, bleeding gums  PE:  General: WDWN patient in NAD. Psych:  Appropriate mood and affect. Neuro:  A&O x 3, Moving all extremities, sensation intact to light touch HEENT:  EOMs intact Chest:  Even non-labored respirations Skin:  Incision C/D/I, no rashes or lesions Extremities: warm/dry, no edema, erythema or echymosis.  No lymphadenopathy. Pulses: Popliteus 2+ MSK:  ROM: L hip limited due to pain, MMT: able to perform quad set, (-) Homan's, (+)  log roll.  Blood pressure (!)  146/64, pulse (!) 59, temperature 97.6 F (36.4 C), temperature source Oral, resp. rate 12, SpO2 90 %.   Assessment/Plan:  Displaced L hip intratrochanteric fracture  -This is an unstable fracture and would require IM nailing of the L hip in order to maintain an ambulatory status.  After discussing the risks of bleeding, infection, nerve damage, blood clots, continued pain, need for additional surgery, amputation, or even death the patient has agreed to undergo the procedure.   -Patient to be admitted by Medicine team -NWB L LE -To OR tomorrow at noon for IM nailing of L hip intratrochanteric fx by Dr. Rod Can -NPO after midnight -Hold blood thinners -AP and Lateral plain films of the femur ordered to further evaluate the extent of the fracture.   Mechele Claude PA-C [facility] Office:  [phone]

## 2018-02-09 NOTE — ED Provider Notes (Signed)
[facility] EMERGENCY DEPARTMENT Provider Note   CSN: [id number] Arrival date & time: 02/09/18  1244     History   Chief Complaint Chief Complaint  Patient presents with  . Fall    HPI [name] is a 85 y.o. female.  The history is provided by the patient and medical records. No language interpreter was used.  Fall      85 year old female with history of paroxysmal atrial fibrillation not on anticoagulant, GERD, hypertension, CHF, CKD, has pacemaker brought here via EMS from home for evaluation of a fall.  Patient report this morning she was walking to her kitchen, accidentally tripped over a box, fell forward and landed directly on her left hip.  She did not hit her head or loss of consciousness but she report acute onset of sharp throbbing pain primarily to her hip, lower back, and right knee.  Pain is moderate to severe, she was unable to ambulate afterward, EMS noted that her left leg is externally rotated and shortened.  Patient received pain medication on route states it did not provide any relief.  She is having persistent nonradiating pain but denies any associated numbness.  She did not report any precipitating symptoms prior to the fall.  Aside from baby aspirin she is not on any blood thinner medication.  She recently injured her left thumb 2 days prior when she actually cut it, was seen in the ED and had a repair.  Past Medical History:  Diagnosis Date  . Anemia    "I stay anemic"  . Arthritis   . Asthma    no problems recent  . Cataract   . Chronic diastolic CHF (congestive heart failure), NYHA class 2 ([place]) 03/19/2014   Echo 4/19: mod LVH, EF 55-60, no RWMA, Gr 1 DD, MAC, trivial MR, mod LAE, normal RVSF, PASP 19  . CKD (chronic kidney disease), stage III ([place])   . Depression   . Diverticulitis   . DVT (deep venous thrombosis) ([place])    from birth control, left groin  . Essential hypertension   . Fibromyalgia 10 y.a.  . GERD  (gastroesophageal reflux disease)   . Gout   . Hx of cardiovascular stress test    Lexiscan Myoview (8/15): apical attenuation artifact, no ischemia, EF 69% - low risk.  Marland Kitchen Hypercholesteremia   . Odynophagia   . Other abnormal glucose   . PAF (paroxysmal atrial fibrillation) ([place])   . Personal history of other diseases of digestive system   . Pneumonia    hx of  . Sinus bradycardia   . Skin cancer 2014   skin R elbow & face  . Symptomatic bradycardia 06/2016    Patient Active Problem List   Diagnosis Date Noted  . Pacemaker 07/06/2017  . Chronic respiratory failure with hypoxia ([place]) 06/09/2017  . Pleural effusion associated with pulmonary infection 06/09/2017  . Hyperkalemia 06/09/2017  . Pleuritic chest pain 06/09/2017  . Fever 05/14/2017  . Renal insufficiency   . Constipation 04/25/2017  . Urine finding 04/25/2017  . Posterior rhinorrhea 04/25/2017  . Acute upper respiratory infection 04/25/2017  . Thrombocytopenia ([place]) 01/15/2017  . CAP (community acquired pneumonia) 01/15/2017  . Symptomatic bradycardia 06/30/2016  . Hypertensive heart disease 04/01/2016  . Bradycardia 04/01/2016  . Fall 04/01/2016  . Sinus bradycardia   . CKD (chronic kidney disease), stage III ([place])   . Normochromic normocytic anemia   . Essential hypertension   . PAF (paroxysmal atrial fibrillation) ([place])   .  Palpitations 09/18/2014  . Shortness of breath 03/19/2014  . Chronic diastolic CHF (congestive heart failure), NYHA class 2 ([place]) 03/19/2014  . Fatigue 09/11/2013  . Gastroenteritis 07/20/2013  . Abdominal pain 07/20/2013  . Abnormal x-ray of abdomen 07/20/2013  . Diarrhea 07/20/2013  . Generalized weakness 07/20/2013  . Obesity (BMI 30-39.9) 07/20/2013  . Hypercholesteremia   . GERD (gastroesophageal reflux disease)   . Depression   . Asthma   . Gout   . Acute blood loss anemia 04/05/2013  . Osteoarthritis of left knee 04/04/2013  . Total knee replacement status 04/04/2013  .  Pulmonary hypertension ([place]) 03/07/2013  . Anemia, unspecified 03/19/2012  . Unspecified deficiency anemia 03/19/2012  . Dizziness 07/09/2010  . Chest pain 07/30/2009  . HYPERTENSION, UNSPECIFIED 08/19/2008  . PAROXYSMAL ATRIAL FIBRILLATION 08/19/2008  . GASTROESOPHAGEAL REFLUX DISEASE, HX OF 08/19/2008    Past Surgical History:  Procedure Laterality Date  . ABDOMINAL HYSTERECTOMY     partial  . APPENDECTOMY  ~55years ago  . CHOLECYSTECTOMY  10/2005   Dr. Effie Shy  . COLONOSCOPY W/ BIOPSIES  2005, 2011   Dr. Bing Plume, "balloon inside for last one at [facility]"  . IR THORACENTESIS ASP PLEURAL SPACE W/IMG GUIDE  06/13/2017  . KNEE ARTHROSCOPY Right   . LAMINECTOMY  05/2007   foraminotomy, L4-5 laminectomy  . LARYNGOSCOPY / BRONCHOSCOPY / ESOPHAGOSCOPY  2010   Dr. Benjamine Mola  . PACEMAKER IMPLANT N/A 07/01/2016   Procedure: Pacemaker Implant;  Surgeon: Evans Lance, MD;  Location: [facility];  Service: Cardiovascular;  Laterality: N/A;  . SKIN CANCER EXCISION Right ~2005   r elbow  . TOTAL KNEE ARTHROPLASTY Left 04/04/2013   Procedure: LEFT TOTAL KNEE ARTHROPLASTY;  Surgeon: Tobi Bastos, MD;  Location: [facility];  Service: Orthopedics;  Laterality: Left;  . TUBAL LIGATION    . TUMOR REMOVAL Right ~ 20 years ago   tumor in between toes     OB History   No obstetric history on file.      Home Medications    Prior to Admission medications   Medication Sig Start Date End Date Taking? Authorizing Provider  acetaminophen (TYLENOL) 500 MG tablet Take 2 tablets (1,000 mg total) by mouth every 8 (eight) hours as needed for mild pain or headache. 05/16/17   Hongalgi, Lenis Dickinson, MD  albuterol (PROVENTIL HFA;VENTOLIN HFA) 108 (90 BASE) MCG/ACT inhaler Inhale 1 puff into the lungs every 6 (six) hours as needed for wheezing or shortness of breath. 11/13/14   Dorothy Spark, MD  alendronate (FOSAMAX) 70 MG tablet Take 70 mg by mouth once a week. On Friday 02/13/14   [provider]  allopurinol (ZYLOPRIM) 100 MG tablet Take 1 tablet (100 mg total) by mouth daily. 04/01/16   Haney, Yetta Flock A, MD  amLODipine (NORVASC) 2.5 MG tablet Take 1 tablet (2.5 mg total) by mouth daily. 07/28/17   Dorothy Spark, MD  aspirin EC 81 MG tablet Take 81 mg by mouth daily.     [provider]  azithromycin (ZITHROMAX) 250 MG tablet Take 1 tablet (250 mg total) by mouth daily. Take first 2 tablets together, then 1 every day until finished. 12/23/17   Wynona Luna, MD  benzonatate (TESSALON) 200 MG capsule Take 1 capsule (200 mg total) by mouth 3 (three) times daily as needed for cough. 12/23/17   Wynona Luna, MD  budesonide-formoterol [facility]) 80-4.5 MCG/ACT inhaler Inhale 2 puffs into the lungs 2 (two) times  daily. 05/20/17   Chesley Mires, MD  cholecalciferol (VITAMIN D) 1000 units tablet Take 1,000 Units by mouth daily.     [provider]  colchicine 0.6 MG tablet Take 1 tablet by mouth every other day. 07/26/16   [provider]  Ferrous Sulfate 28 MG TABS Take 28 mg by mouth daily.     [provider]  Magnesium Oxide 200 MG TABS Take 1 tablet (200 mg total) by mouth 2 (two) times daily. 06/13/17   Dorothy Spark, MD  montelukast (SINGULAIR) 10 MG tablet Take 10 mg by mouth at bedtime.      [provider]  pravastatin (PRAVACHOL) 20 MG tablet Take 20 mg by mouth at bedtime.  09/17/14   [provider]  ranitidine (ZANTAC) 150 MG tablet Take 1 tablet by mouth at bedtime. 09/24/16   [provider]  sertraline (ZOLOFT) 50 MG tablet Take 50 mg by mouth daily.  03/15/12   [provider]  traMADol (ULTRAM) 50 MG tablet Take 1 tablet (50 mg total) by mouth every 12 (twelve) hours as needed for moderate pain. 05/16/17   Hongalgi, Lenis Dickinson, MD    Family History Family History  Problem Relation Age of Onset  . Heart disease Mother   . Hypertension Mother   . Fainting Mother   . Arrhythmia  Mother   . Diabetes Father   . Stroke Father   . Hypertension Father   . Cancer Brother   . Sudden death Brother   . Heart attack Brother   . Cancer Sister   . Diabetes Sister   . Asthma Sister     Social History Social History   Tobacco Use  . Smoking status: Former Smoker    Years: 10.00    Types: Cigarettes    Last attempt to quit: 02/08/1973    Years since quitting: 45.0  . Smokeless tobacco: Never Used  Substance Use Topics  . Alcohol use: No  . Drug use: No     Allergies   Penicillins; Amoxicillin; Diltiazem hcl; Sulfonamide derivatives; and Zetia [ezetimibe]   Review of Systems Review of Systems  All other systems reviewed and are negative.    Physical Exam Updated Vital Signs BP 139/66   Pulse 60   Temp 97.6 F (36.4 C) (Oral)   Resp 13   SpO2 98%   Physical Exam Vitals signs and nursing note reviewed.  Constitutional:      General: She is not in acute distress.    Appearance: She is well-developed.     Comments: Elderly female appears uncomfortable.  HENT:     Head: Normocephalic and atraumatic.     Comments: No scalp tenderness Eyes:     Conjunctiva/sclera: Conjunctivae normal.  Neck:     Musculoskeletal: Neck supple.  Cardiovascular:     Rate and Rhythm: Normal rate and regular rhythm.  Pulmonary:     Effort: Pulmonary effort is normal.     Breath sounds: Normal breath sounds.  Abdominal:     Palpations: Abdomen is soft.     Tenderness: There is no abdominal tenderness.  Musculoskeletal:        General: Tenderness (Left leg: Leg is externally rotated and shortened, tenderness along left hip on palpation with decreased hip range of motion.) present.     Comments: Tenderness along lumbar spine and paraspinal muscle.  Tenderness to right knee with normal knee flexion and extension.  Intact distal pedal pulses.  Skin:    Findings:  No rash.  Neurological:     Mental Status: She is alert and oriented to person, place, and time.    Psychiatric:        Mood and Affect: Mood normal.      ED Treatments / Results  Labs (all labs ordered are listed, but only abnormal results are displayed) Labs Reviewed  CBC WITH DIFFERENTIAL/PLATELET - Abnormal; Notable for the following components:      Result Value   RBC 3.09 (*)    Hemoglobin 9.8 (*)    HCT 30.8 (*)    Platelets 123 (*)    Lymphs Abs 0.6 (*)    All other components within normal limits  I-STAT CHEM 8, ED - Abnormal; Notable for the following components:   Creatinine, Ser 1.10 (*)    Glucose, Bld 140 (*)    Hemoglobin 10.2 (*)    HCT 30.0 (*)    All other components within normal limits  URINALYSIS, ROUTINE W REFLEX MICROSCOPIC    EKG None  ED ECG REPORT   Date: 02/09/2018  Rate: 60  Rhythm: atrial paced rhythm  QRS Axis: left  Intervals: normal  ST/T Wave abnormalities: normal  Conduction Disutrbances:left anterior fascicular block  Narrative Interpretation:   Old EKG Reviewed: unchanged  I have personally reviewed the EKG tracing and agree with the computerized printout as noted.   Radiology Dg Chest 1 View  Result Date: 02/09/2018 CLINICAL DATA:  Left hip fracture. EXAM: CHEST  1 VIEW COMPARISON:  Radiographs of September 28, 2017. FINDINGS: Stable cardiomegaly. Left-sided pacemaker is unchanged in position. No pneumothorax or pleural effusion is noted. Right lung is clear. Stable left midlung and basilar scarring is noted. No acute pulmonary disease is noted. Bony thorax is unremarkable. IMPRESSION: No acute cardiopulmonary abnormality seen. Electronically Signed   By: Marijo Conception, M.D.   On: 02/09/2018 15:27   Dg Lumbar Spine Complete  Result Date: 02/09/2018 CLINICAL DATA:  Left hip pain after fall. EXAM: LUMBAR SPINE - COMPLETE 4+ VIEW COMPARISON:  CT scan of November 04, 2017. FINDINGS: No fracture or spondylolisthesis is noted. Mild degenerative disc disease is noted at L1-2 with anterior osteophyte formation. Diffuse osteopenia is  noted. Remaining disc spaces are unremarkable. Atherosclerosis of abdominal aorta is noted. IMPRESSION: Mild degenerative disc disease is noted at L1-2. No acute abnormality seen in the lumbar spine. Aortic Atherosclerosis (ICD10-I70.0). Electronically Signed   By: Marijo Conception, M.D.   On: 02/09/2018 15:22   Dg Knee Complete 4 Views Right  Result Date: 02/09/2018 CLINICAL DATA:  Right knee pain after fall today. EXAM: RIGHT KNEE - COMPLETE 4+ VIEW COMPARISON:  Radiographs of May 01, 2010. FINDINGS: No evidence of fracture, dislocation, or joint effusion. Moderate narrowing of medial joint space is noted with osteophyte formation. Soft tissues are unremarkable. IMPRESSION: Moderate degenerative joint disease is noted medially. No acute abnormality seen in the right knee. Electronically Signed   By: Marijo Conception, M.D.   On: 02/09/2018 15:24   Dg Hip Unilat W Or Wo Pelvis 2-3 Views Left  Result Date: 02/09/2018 CLINICAL DATA:  Left hip pain after fall today. EXAM: DG HIP (WITH OR WITHOUT PELVIS) 2-3V LEFT COMPARISON:  None. FINDINGS: Moderately displaced and comminuted fracture is seen involving the intertrochanteric region of proximal left femur. Right hip joint appears normal. IMPRESSION: Moderately displaced and comminuted intertrochanteric proximal left femoral fracture. Electronically Signed   By: Marijo Conception, M.D.   On: 02/09/2018 15:20  Procedures Procedures (including critical care time)  Medications Ordered in ED Medications  HYDROmorphone (DILAUDID) injection 1 mg (has no administration in time range)  fentaNYL (SUBLIMAZE) injection 50 mcg (50 mcg Intravenous Given 02/09/18 1357)  ondansetron (ZOFRAN) injection 4 mg (4 mg Intravenous Given 02/09/18 1356)  HYDROmorphone (DILAUDID) injection 1 mg (1 mg Intravenous Given 02/09/18 1555)     Initial Impression / Assessment and Plan / ED Course  I have reviewed the triage vital signs and the nursing notes.  Pertinent labs & imaging  results that were available during my care of the patient were reviewed by me and considered in my medical decision making (see chart for details).     BP (!) 147/66   Pulse 60   Temp 97.6 F (36.4 C) (Oral)   Resp 15   SpO2 100%    Final Clinical Impressions(s) / ED Diagnoses   Final diagnoses:  Fracture of lower femoral epiphysis (separation), closed, left, initial encounter [facility])    ED Discharge Orders    None     1:50 PM Patient had a mechanical fall where she tripped over a box and fell landed on her left hip.  Left leg is externally rotated and shortened concerning for left hip fracture.  She has tenderness along her lumbar spine and right knee as well, appropriate x-ray obtained, pain medication and antinausea medication given.  Patient's last meal was 5 hours ago.   3:40 PM Appreciate consultation with on-call orthopedist, Dr. Doran Durand, who request patient to be on n.p.o. and he will determine appropriate management after discussed with his orthopedic partner Dr. Lyla Glassing.  6:37 PM Appreciate consultation from [facility] Dr. Marlowe Sax who agrees to see and admit pt for further care. Plan of surgical operation tomorrow.    Domenic Moras, PA-C 02/09/18 1857    Nat Christen, MD 02/11/18 [phone]

## 2018-02-09 NOTE — ED Notes (Signed)
Portable XR at bedside.  Pt given [place] sandwich and water to drink per RN (heart healthy).

## 2018-02-09 NOTE — H&P (View-Only) (Signed)
Reason for Consult: Displaced L hip intratrochanteric fracture  Referring Physician: Dr. Nat Christen   HPI: Patient is an 85 year old female, with PMH significant for CHF, hx of DVT, Afib, and CKD, that presented to [facility] ED via EMS after sustaining a fall at home this morning around 9:30am.  She reports that she turned in the kitchen, tripped over a box with her foot and fell directly on her L hip.  She reports that she was unable to bear weight due to the pain.  911 was called and the patient was then transported to the hospital.  She describes a sharp stabbing sensation.  She notes that NWB and immobilization help to alleviate her symptoms.  Radiographs of the hip were obtained which demonstrate a displaced L hip intratrochanteric fx.  Dr. Wylene Simmer was then consulted.  The patient resides with her daughter.  She denies any hx of diabetes, thryoid disease, autoimmune disorders, CA, and she is a nonsmoker.  She does take an 81 mg ASA daily.  Her daughter and son are at bedside.  Siblings reports that after her L TKR she did require a transfusion.  Past Medical History:  Diagnosis Date  . Anemia    "I stay anemic"  . Arthritis   . Asthma    no problems recent  . Cataract   . Chronic diastolic CHF (congestive heart failure), NYHA class 2 ([place]) 03/19/2014   Echo 4/19: mod LVH, EF 55-60, no RWMA, Gr 1 DD, MAC, trivial MR, mod LAE, normal RVSF, PASP 19  . CKD (chronic kidney disease), stage III ([place])   . Depression   . Diverticulitis   . DVT (deep venous thrombosis) ([place])    from birth control, left groin  . Essential hypertension   . Fibromyalgia 10 y.a.  . GERD (gastroesophageal reflux disease)   . Gout   . Hx of cardiovascular stress test    Lexiscan Myoview (8/15): apical attenuation artifact, no ischemia, EF 69% - low risk.  Marland Kitchen Hypercholesteremia   . Odynophagia   . Other abnormal glucose   . PAF (paroxysmal atrial fibrillation) ([place])   . Personal history of other diseases of  digestive system   . Pneumonia    hx of  . Sinus bradycardia   . Skin cancer 2014   skin R elbow & face  . Symptomatic bradycardia 06/2016    Past Surgical History:  Procedure Laterality Date  . ABDOMINAL HYSTERECTOMY     partial  . APPENDECTOMY  ~55years ago  . CHOLECYSTECTOMY  10/2005   Dr. Effie Shy  . COLONOSCOPY W/ BIOPSIES  2005, 2011   Dr. Bing Plume, "balloon inside for last one at [facility]"  . IR THORACENTESIS ASP PLEURAL SPACE W/IMG GUIDE  06/13/2017  . KNEE ARTHROSCOPY Right   . LAMINECTOMY  05/2007   foraminotomy, L4-5 laminectomy  . LARYNGOSCOPY / BRONCHOSCOPY / ESOPHAGOSCOPY  2010   Dr. Benjamine Mola  . PACEMAKER IMPLANT N/A 07/01/2016   Procedure: Pacemaker Implant;  Surgeon: Evans Lance, MD;  Location: [facility];  Service: Cardiovascular;  Laterality: N/A;  . SKIN CANCER EXCISION Right ~2005   r elbow  . TOTAL KNEE ARTHROPLASTY Left 04/04/2013   Procedure: LEFT TOTAL KNEE ARTHROPLASTY;  Surgeon: Tobi Bastos, MD;  Location: [facility];  Service: Orthopedics;  Laterality: Left;  . TUBAL LIGATION    . TUMOR REMOVAL Right ~ 20 years ago   tumor in between toes    Family History  Problem  Relation Age of Onset  . Heart disease Mother   . Hypertension Mother   . Fainting Mother   . Arrhythmia Mother   . Diabetes Father   . Stroke Father   . Hypertension Father   . Cancer Brother   . Sudden death Brother   . Heart attack Brother   . Cancer Sister   . Diabetes Sister   . Asthma Sister     Social History:  reports that she quit smoking about 45 years ago. Her smoking use included cigarettes. She quit after 10.00 years of use. She has never used smokeless tobacco. She reports that she does not drink alcohol or use drugs.  Allergies:  Allergies  Allergen Reactions  . Penicillins Rash    DID THE REACTION INVOLVE: Swelling of the face/tongue/throat, SOB, or low BP? Unknown Sudden or severe rash/hives, skin peeling, or the inside of the mouth or nose?  Unknown Did it require medical treatment? Unknown When did it last happen?unknown If all above answers are "NO", may proceed with cephalosporin use.   Marland Kitchen Amoxicillin Other (See Comments)    Very weak Has patient had a PCN reaction causing immediate rash, facial/tongue/throat swelling, SOB or lightheadedness with hypotension: no Has patient had a PCN reaction causing severe rash involving mucus membranes or skin necrosis: no Has patient had a PCN reaction that required hospitalization: no Has patient had a PCN reaction occurring within the last 10 years: no If all of the above answers are "NO", then may proceed with Cephalosporin use.   . Diltiazem Hcl Other (See Comments)    Reaction unknown  . Sulfonamide Derivatives Other (See Comments)    Reaction unknown  . Zetia [Ezetimibe] Other (See Comments)    Weakness    Medications: I have reviewed the patient's current medications.  Results for orders placed or performed during the hospital encounter of 02/09/18 (from the past 48 hour(s))  CBC with Differential     Status: Abnormal   Collection Time: 02/09/18  3:30 PM  Result Value Ref Range   WBC 4.9 4.0 - 10.5 K/uL   RBC 3.09 (L) 3.87 - 5.11 MIL/uL   Hemoglobin 9.8 (L) 12.0 - 15.0 g/dL   HCT 30.8 (L) 36.0 - 46.0 %   MCV 99.7 80.0 - 100.0 fL   MCH 31.7 26.0 - 34.0 pg   MCHC 31.8 30.0 - 36.0 g/dL   RDW 14.1 11.5 - 15.5 %   Platelets 123 (L) 150 - 400 K/uL    Comment: REPEATED TO VERIFY   nRBC 0.0 0.0 - 0.2 %   Neutrophils Relative % 81 %   Neutro Abs 4.0 1.7 - 7.7 K/uL   Lymphocytes Relative 12 %   Lymphs Abs 0.6 (L) 0.7 - 4.0 K/uL   Monocytes Relative 5 %   Monocytes Absolute 0.3 0.1 - 1.0 K/uL   Eosinophils Relative 1 %   Eosinophils Absolute 0.0 0.0 - 0.5 K/uL   Basophils Relative 0 %   Basophils Absolute 0.0 0.0 - 0.1 K/uL   Immature Granulocytes 1 %   Abs Immature Granulocytes 0.05 0.00 - 0.07 K/uL    Comment: Performed at [facility], [address] [place], [place] [address]  I-stat Chem 8, ED     Status: Abnormal   Collection Time: 02/09/18  3:44 PM  Result Value Ref Range   Sodium 141 135 - 145 mmol/L   Potassium 4.1 3.5 - 5.1 mmol/L   Chloride 108 98 - 111  mmol/L   BUN 20 8 - 23 mg/dL   Creatinine, Ser 1.10 (H) 0.44 - 1.00 mg/dL   Glucose, Bld 140 (H) 70 - 99 mg/dL   Calcium, Ion 1.20 1.15 - 1.40 mmol/L   TCO2 22 22 - 32 mmol/L   Hemoglobin 10.2 (L) 12.0 - 15.0 g/dL   HCT 30.0 (L) 36.0 - 46.0 %    Dg Chest 1 View  Result Date: 02/09/2018 CLINICAL DATA:  Left hip fracture. EXAM: CHEST  1 VIEW COMPARISON:  Radiographs of September 28, 2017. FINDINGS: Stable cardiomegaly. Left-sided pacemaker is unchanged in position. No pneumothorax or pleural effusion is noted. Right lung is clear. Stable left midlung and basilar scarring is noted. No acute pulmonary disease is noted. Bony thorax is unremarkable. IMPRESSION: No acute cardiopulmonary abnormality seen. Electronically Signed   By: Marijo Conception, M.D.   On: 02/09/2018 15:27   Dg Lumbar Spine Complete  Result Date: 02/09/2018 CLINICAL DATA:  Left hip pain after fall. EXAM: LUMBAR SPINE - COMPLETE 4+ VIEW COMPARISON:  CT scan of November 04, 2017. FINDINGS: No fracture or spondylolisthesis is noted. Mild degenerative disc disease is noted at L1-2 with anterior osteophyte formation. Diffuse osteopenia is noted. Remaining disc spaces are unremarkable. Atherosclerosis of abdominal aorta is noted. IMPRESSION: Mild degenerative disc disease is noted at L1-2. No acute abnormality seen in the lumbar spine. Aortic Atherosclerosis (ICD10-I70.0). Electronically Signed   By: Marijo Conception, M.D.   On: 02/09/2018 15:22   Dg Knee Complete 4 Views Right  Result Date: 02/09/2018 CLINICAL DATA:  Right knee pain after fall today. EXAM: RIGHT KNEE - COMPLETE 4+ VIEW COMPARISON:  Radiographs of May 01, 2010. FINDINGS: No evidence of fracture, dislocation, or joint effusion. Moderate narrowing of medial joint  space is noted with osteophyte formation. Soft tissues are unremarkable. IMPRESSION: Moderate degenerative joint disease is noted medially. No acute abnormality seen in the right knee. Electronically Signed   By: Marijo Conception, M.D.   On: 02/09/2018 15:24   Dg Hip Unilat W Or Wo Pelvis 2-3 Views Left  Result Date: 02/09/2018 CLINICAL DATA:  Left hip pain after fall today. EXAM: DG HIP (WITH OR WITHOUT PELVIS) 2-3V LEFT COMPARISON:  None. FINDINGS: Moderately displaced and comminuted fracture is seen involving the intertrochanteric region of proximal left femur. Right hip joint appears normal. IMPRESSION: Moderately displaced and comminuted intertrochanteric proximal left femoral fracture. Electronically Signed   By: Marijo Conception, M.D.   On: 02/09/2018 15:20    ROS:   Gen: Denies fever, chills, weight change, fatigue, night sweats MSK: C/o of L hip pain Neuro: Denies headache, numbness, weakness, slurred speech, loss of memory or consciousness Derm: Denies rash, dry skin, scaling or peeling skin change HEENT: Denies blurred vision, double vision, neck stiffness, dysphagia PULM: Denies shortness of breath, cough, sputum production, hemoptysis, wheezing CV: Denies chest pain, edema, orthopnea, palpitations GI: Denies abdominal pain, nausea, vomiting, diarrhea, hematochezia, melena, constipation  Endocrine: Denies hot or cold intolerance, polyuria, polyphagia or appetite change Heme: Denies easy bruising, bleeding, bleeding gums  PE:  General: WDWN patient in NAD. Psych:  Appropriate mood and affect. Neuro:  A&O x 3, Moving all extremities, sensation intact to light touch HEENT:  EOMs intact Chest:  Even non-labored respirations Skin:  Incision C/D/I, no rashes or lesions Extremities: warm/dry, no edema, erythema or echymosis.  No lymphadenopathy. Pulses: Popliteus 2+ MSK:  ROM: L hip limited due to pain, MMT: able to perform quad set, (-) Homan's, (+)  log roll.  Blood pressure (!)  146/64, pulse (!) 59, temperature 97.6 F (36.4 C), temperature source Oral, resp. rate 12, SpO2 90 %.   Assessment/Plan:  Displaced L hip intratrochanteric fracture  -This is an unstable fracture and would require IM nailing of the L hip in order to maintain an ambulatory status.  After discussing the risks of bleeding, infection, nerve damage, blood clots, continued pain, need for additional surgery, amputation, or even death the patient has agreed to undergo the procedure.   -Patient to be admitted by Medicine team -NWB L LE -To OR tomorrow at noon for IM nailing of L hip intratrochanteric fx by Dr. Rod Can -NPO after midnight -Hold blood thinners -AP and Lateral plain films of the femur ordered to further evaluate the extent of the fracture.   Mechele Claude PA-C [facility] Office:  [phone]

## 2018-02-09 NOTE — ED Notes (Signed)
Pt oxygen saturations decreased to 86% post administration of dilaudid. Pt placed on 2L [place] with increase of O2 saturation to 94%. Will cont to monitor.

## 2018-02-09 NOTE — ED Notes (Signed)
Pt returned from imaging.

## 2018-02-09 NOTE — Anesthesia Preprocedure Evaluation (Addendum)
Anesthesia Evaluation  Patient identified by MRN, date of birth, ID band Patient awake    Reviewed: Allergy & Precautions, NPO status , Patient's Chart, lab work & pertinent test results  History of Anesthesia Complications Negative for: history of anesthetic complications  Airway Mallampati: II  TM Distance: >3 FB Neck ROM: Full    Dental  (+) Teeth Intact, Dental Advisory Given   Pulmonary asthma , former smoker,    Pulmonary exam normal breath sounds clear to auscultation       Cardiovascular hypertension, Pt. on medications +CHF  Normal cardiovascular exam+ pacemaker  Rhythm:Regular Rate:Normal  TTE 05/2017: Normal LV size with moderate LV hypertrophy. EF 55-60%. Normal RV size and systolic function. No significant valvular abnormalities.   Neuro/Psych Depression negative neurological ROS     GI/Hepatic Neg liver ROS, GERD  Medicated and Controlled,  Endo/Other  negative endocrine ROS  Renal/GU Renal InsufficiencyRenal disease     Musculoskeletal  (+) Arthritis , Fibromyalgia -  Abdominal   Peds  Hematology  (+) anemia ,   Anesthesia Other Findings Day of surgery medications reviewed with the patient.  Reproductive/Obstetrics                            Anesthesia Physical Anesthesia Plan  ASA: III  Anesthesia Plan: General   Post-op Pain Management:    Induction: Intravenous  PONV Risk Score and Plan: 3 and Treatment may vary due to age or medical condition, Ondansetron and Dexamethasone  Airway Management Planned: Oral ETT  Additional Equipment:   Intra-op Plan:   Post-operative Plan: Extubation in OR  Informed Consent: I have reviewed the patients History and Physical, chart, labs and discussed the procedure including the risks, benefits and alternatives for the proposed anesthesia with the patient or authorized representative who has indicated his/her understanding and  acceptance.   Dental advisory given  Plan Discussed with: CRNA  Anesthesia Plan Comments: (Discussed spinal versus GETA, pt prefers GETA.)       Anesthesia Quick Evaluation

## 2018-02-09 NOTE — ED Notes (Signed)
Light green top collected in addition to CBC and I-Stat Chem 8.  Pt placed on Purewick at this time.

## 2018-02-10 ENCOUNTER — Inpatient Hospital Stay (HOSPITAL_COMMUNITY): Payer: Medicare HMO | Admitting: Anesthesiology

## 2018-02-10 ENCOUNTER — Inpatient Hospital Stay (HOSPITAL_COMMUNITY): Payer: Medicare HMO

## 2018-02-10 ENCOUNTER — Encounter (HOSPITAL_COMMUNITY)
Admission: EM | Disposition: A | Discharge status: Skilled Nursing Facility | Payer: Self-pay | Source: Home / Self Care | Attending: Internal Medicine

## 2018-02-10 ENCOUNTER — Encounter (HOSPITAL_COMMUNITY): Payer: Self-pay | Admitting: General Practice

## 2018-02-10 DIAGNOSIS — S72002A Fracture of unspecified part of neck of left femur, initial encounter for closed fracture: Secondary | ICD-10-CM

## 2018-02-10 DIAGNOSIS — S72009A Fracture of unspecified part of neck of unspecified femur, initial encounter for closed fracture: Secondary | ICD-10-CM

## 2018-02-10 DIAGNOSIS — S72142A Displaced intertrochanteric fracture of left femur, initial encounter for closed fracture: Secondary | ICD-10-CM | POA: Diagnosis present

## 2018-02-10 HISTORY — PX: INTRAMEDULLARY (IM) NAIL INTERTROCHANTERIC: SHX5875

## 2018-02-10 LAB — URINALYSIS, ROUTINE W REFLEX MICROSCOPIC
BILIRUBIN URINE: NEGATIVE
Glucose, UA: NEGATIVE mg/dL
HGB URINE DIPSTICK: NEGATIVE
Ketones, ur: NEGATIVE mg/dL
Leukocytes, UA: NEGATIVE
Nitrite: NEGATIVE
Protein, ur: NEGATIVE mg/dL
Specific Gravity, Urine: 1.028 (ref 1.005–1.030)
pH: 5 (ref 5.0–8.0)

## 2018-02-10 LAB — SURGICAL PCR SCREEN
MRSA, PCR: NEGATIVE
Staphylococcus aureus: NEGATIVE

## 2018-02-10 SURGERY — FIXATION, FRACTURE, INTERTROCHANTERIC, WITH INTRAMEDULLARY ROD
Anesthesia: General | Site: Hip | Laterality: Left

## 2018-02-10 MED ORDER — METOCLOPRAMIDE HCL 5 MG/ML IJ SOLN: 5.0000 mg | Freq: Three times a day (TID) | INTRAMUSCULAR | Status: DC | PRN

## 2018-02-10 MED ORDER — LACTATED RINGERS IV SOLN
INTRAVENOUS | Status: DC | PRN
  Administered 2018-02-10: 13:00:00 via INTRAVENOUS

## 2018-02-10 MED ORDER — ENOXAPARIN SODIUM 40 MG/0.4ML ~~LOC~~ SOLN
40.0000 mg | SUBCUTANEOUS | Status: DC
  Administered 2018-02-11 – 2018-02-16 (×6): 40 mg via SUBCUTANEOUS
  Filled 2018-02-10 (×6): qty 0.4

## 2018-02-10 MED ORDER — SODIUM CHLORIDE 0.9 % IV SOLN
INTRAVENOUS | Status: DC | PRN
  Administered 2018-02-10: 30 ug/min via INTRAVENOUS

## 2018-02-10 MED ORDER — METOCLOPRAMIDE HCL 5 MG PO TABS: 5.0000 mg | ORAL_TABLET | Freq: Three times a day (TID) | ORAL | Status: DC | PRN

## 2018-02-10 MED ORDER — SODIUM CHLORIDE 0.9 % IV SOLN: INTRAVENOUS | Status: DC

## 2018-02-10 MED ORDER — PROPOFOL 10 MG/ML IV BOLUS
INTRAVENOUS | Status: DC | PRN
  Administered 2018-02-10: 100 mg via INTRAVENOUS

## 2018-02-10 MED ORDER — HYDROMORPHONE HCL 1 MG/ML IJ SOLN
0.2500 mg | INTRAMUSCULAR | Status: DC | PRN
  Administered 2018-02-10 (×3): 0.25 mg via INTRAVENOUS

## 2018-02-10 MED ORDER — DOCUSATE SODIUM 100 MG PO CAPS
100.0000 mg | ORAL_CAPSULE | Freq: Two times a day (BID) | ORAL | Status: DC
  Administered 2018-02-10 – 2018-02-16 (×11): 100 mg via ORAL
  Filled 2018-02-10 (×12): qty 1

## 2018-02-10 MED ORDER — ONDANSETRON HCL 4 MG PO TABS
4.0000 mg | ORAL_TABLET | Freq: Four times a day (QID) | ORAL | Status: DC | PRN
  Filled 2018-02-10: qty 1

## 2018-02-10 MED ORDER — FENTANYL CITRATE (PF) 100 MCG/2ML IJ SOLN
25.0000 ug | INTRAMUSCULAR | Status: DC | PRN
  Administered 2018-02-10 (×3): 25 ug via INTRAVENOUS

## 2018-02-10 MED ORDER — ONDANSETRON HCL 4 MG/2ML IJ SOLN
4.0000 mg | Freq: Four times a day (QID) | INTRAMUSCULAR | Status: DC | PRN
  Administered 2018-02-13 – 2018-02-15 (×2): 4 mg via INTRAVENOUS
  Filled 2018-02-10 (×2): qty 2

## 2018-02-10 MED ORDER — ONDANSETRON HCL 4 MG/2ML IJ SOLN
INTRAMUSCULAR | Status: DC | PRN
  Administered 2018-02-10: 4 mg via INTRAVENOUS

## 2018-02-10 MED ORDER — ROCURONIUM BROMIDE 10 MG/ML (PF) SYRINGE
PREFILLED_SYRINGE | INTRAVENOUS | Status: DC | PRN
  Administered 2018-02-10: 30 mg via INTRAVENOUS
  Administered 2018-02-10: 20 mg via INTRAVENOUS

## 2018-02-10 MED ORDER — CHLORHEXIDINE GLUCONATE 4 % EX LIQD
60.0000 mL | Freq: Once | CUTANEOUS | Status: AC
  Administered 2018-02-10: 4 via TOPICAL
  Filled 2018-02-10: qty 60

## 2018-02-10 MED ORDER — HYDROMORPHONE HCL 1 MG/ML IJ SOLN
INTRAMUSCULAR | Status: AC
  Filled 2018-02-10: qty 1

## 2018-02-10 MED ORDER — PHENOL 1.4 % MT LIQD: 1.0000 | OROMUCOSAL | Status: DC | PRN

## 2018-02-10 MED ORDER — LACTATED RINGERS IV SOLN
INTRAVENOUS | Status: DC
  Administered 2018-02-10: 12:00:00 via INTRAVENOUS

## 2018-02-10 MED ORDER — 0.9 % SODIUM CHLORIDE (POUR BTL) OPTIME
TOPICAL | Status: DC | PRN
  Administered 2018-02-10: 1000 mL

## 2018-02-10 MED ORDER — EPHEDRINE SULFATE-NACL 50-0.9 MG/10ML-% IV SOSY
PREFILLED_SYRINGE | INTRAVENOUS | Status: DC | PRN
  Administered 2018-02-10 (×2): 5 mg via INTRAVENOUS

## 2018-02-10 MED ORDER — OXYCODONE HCL 5 MG PO TABS
5.0000 mg | ORAL_TABLET | Freq: Once | ORAL | Status: AC | PRN
  Administered 2018-02-10: 5 mg via ORAL

## 2018-02-10 MED ORDER — ACETAMINOPHEN 10 MG/ML IV SOLN
1000.0000 mg | Freq: Once | INTRAVENOUS | Status: DC | PRN
  Administered 2018-02-10: 1000 mg via INTRAVENOUS

## 2018-02-10 MED ORDER — SUGAMMADEX SODIUM 200 MG/2ML IV SOLN
INTRAVENOUS | Status: DC | PRN
  Administered 2018-02-10: 200 mg via INTRAVENOUS

## 2018-02-10 MED ORDER — FENTANYL CITRATE (PF) 250 MCG/5ML IJ SOLN
INTRAMUSCULAR | Status: AC
  Filled 2018-02-10: qty 5

## 2018-02-10 MED ORDER — FENTANYL CITRATE (PF) 100 MCG/2ML IJ SOLN
INTRAMUSCULAR | Status: AC
  Filled 2018-02-10: qty 2

## 2018-02-10 MED ORDER — OXYCODONE HCL 5 MG PO TABS
ORAL_TABLET | ORAL | Status: AC
  Filled 2018-02-10: qty 1

## 2018-02-10 MED ORDER — CEFAZOLIN SODIUM-DEXTROSE 2-4 GM/100ML-% IV SOLN
INTRAVENOUS | Status: AC
  Filled 2018-02-10: qty 100

## 2018-02-10 MED ORDER — LIDOCAINE 2% (20 MG/ML) 5 ML SYRINGE
INTRAMUSCULAR | Status: DC | PRN
  Administered 2018-02-10: 60 mg via INTRAVENOUS

## 2018-02-10 MED ORDER — PHENYLEPHRINE 40 MCG/ML (10ML) SYRINGE FOR IV PUSH (FOR BLOOD PRESSURE SUPPORT)
PREFILLED_SYRINGE | INTRAVENOUS | Status: DC | PRN
  Administered 2018-02-10: 80 ug via INTRAVENOUS

## 2018-02-10 MED ORDER — PROPOFOL 10 MG/ML IV BOLUS
INTRAVENOUS | Status: AC
  Filled 2018-02-10: qty 20

## 2018-02-10 MED ORDER — CEFAZOLIN SODIUM-DEXTROSE 2-4 GM/100ML-% IV SOLN
2.0000 g | Freq: Four times a day (QID) | INTRAVENOUS | Status: AC
  Administered 2018-02-10 (×2): 2 g via INTRAVENOUS
  Filled 2018-02-10 (×2): qty 100

## 2018-02-10 MED ORDER — OXYCODONE HCL 5 MG/5ML PO SOLN: 5.0000 mg | Freq: Once | ORAL | Status: AC | PRN

## 2018-02-10 MED ORDER — ACETAMINOPHEN 10 MG/ML IV SOLN
INTRAVENOUS | Status: AC
  Filled 2018-02-10: qty 100

## 2018-02-10 MED ORDER — SENNA 8.6 MG PO TABS
1.0000 | ORAL_TABLET | Freq: Two times a day (BID) | ORAL | Status: DC
  Administered 2018-02-10 – 2018-02-16 (×12): 8.6 mg via ORAL
  Filled 2018-02-10 (×12): qty 1

## 2018-02-10 MED ORDER — POVIDONE-IODINE 10 % EX SWAB: 2.0000 "application " | Freq: Once | CUTANEOUS | Status: DC

## 2018-02-10 MED ORDER — FENTANYL CITRATE (PF) 250 MCG/5ML IJ SOLN
INTRAMUSCULAR | Status: DC | PRN
  Administered 2018-02-10 (×4): 50 ug via INTRAVENOUS

## 2018-02-10 MED ORDER — CEFAZOLIN SODIUM-DEXTROSE 2-4 GM/100ML-% IV SOLN: 2.0000 g | INTRAVENOUS | Status: DC

## 2018-02-10 MED ORDER — MENTHOL 3 MG MT LOZG: 1.0000 | LOZENGE | OROMUCOSAL | Status: DC | PRN

## 2018-02-10 MED ORDER — ALBUTEROL SULFATE HFA 108 (90 BASE) MCG/ACT IN AERS
INHALATION_SPRAY | RESPIRATORY_TRACT | Status: DC | PRN
  Administered 2018-02-10: 4 via RESPIRATORY_TRACT

## 2018-02-10 MED ORDER — CEFAZOLIN SODIUM-DEXTROSE 2-3 GM-%(50ML) IV SOLR
INTRAVENOUS | Status: DC | PRN
  Administered 2018-02-10: 2 g via INTRAVENOUS

## 2018-02-10 SURGICAL SUPPLY — 53 items
ADH SKN CLS APL DERMABOND .7 (GAUZE/BANDAGES/DRESSINGS) ×2
ADH SKN CLS LQ APL DERMABOND (GAUZE/BANDAGES/DRESSINGS) ×1
ALCOHOL ISOPROPYL (RUBBING) (MISCELLANEOUS) ×2 IMPLANT
BIT DRILL LAG SCREW (DRILL) IMPLANT
BNDG COHESIVE 4X5 TAN STRL (GAUZE/BANDAGES/DRESSINGS) IMPLANT
CHLORAPREP W/TINT 26ML (MISCELLANEOUS) ×2 IMPLANT
COVER PERINEAL POST (MISCELLANEOUS) ×2 IMPLANT
COVER SURGICAL LIGHT HANDLE (MISCELLANEOUS) ×2 IMPLANT
COVER WAND RF STERILE (DRAPES) ×2 IMPLANT
DERMABOND ADHESIVE PROPEN (GAUZE/BANDAGES/DRESSINGS) ×1
DERMABOND ADVANCED (GAUZE/BANDAGES/DRESSINGS) ×2
DERMABOND ADVANCED .7 DNX12 (GAUZE/BANDAGES/DRESSINGS) ×2 IMPLANT
DERMABOND ADVANCED .7 DNX6 (GAUZE/BANDAGES/DRESSINGS) IMPLANT
DRAPE C-ARM 42X72 X-RAY (DRAPES) ×2 IMPLANT
DRAPE C-ARMOR (DRAPES) ×2 IMPLANT
DRAPE HALF SHEET 40X57 (DRAPES) ×2 IMPLANT
DRAPE IMP U-DRAPE 54X76 (DRAPES) ×4 IMPLANT
DRAPE STERI IOBAN 125X83 (DRAPES) ×2 IMPLANT
DRAPE U-SHAPE 47X51 STRL (DRAPES) ×4 IMPLANT
DRAPE UNIVERSAL PACK (DRAPES) ×2 IMPLANT
DRILL LAG SCREW (DRILL) ×2
DRSG MEPILEX BORDER 4X4 (GAUZE/BANDAGES/DRESSINGS) ×4 IMPLANT
DRSG PAD ABDOMINAL 8X10 ST (GAUZE/BANDAGES/DRESSINGS) IMPLANT
ELECT REM PT RETURN 9FT ADLT (ELECTROSURGICAL) ×2
ELECTRODE REM PT RTRN 9FT ADLT (ELECTROSURGICAL) ×1 IMPLANT
FACESHIELD WRAPAROUND (MASK) ×2 IMPLANT
FACESHIELD WRAPAROUND OR TEAM (MASK) ×1 IMPLANT
GLOVE BIO SURGEON STRL SZ8.5 (GLOVE) ×4 IMPLANT
GLOVE BIOGEL PI IND STRL 8.5 (GLOVE) ×1 IMPLANT
GLOVE BIOGEL PI INDICATOR 8.5 (GLOVE) ×1
GOWN STRL REUS W/ TWL LRG LVL3 (GOWN DISPOSABLE) ×2 IMPLANT
GOWN STRL REUS W/TWL 2XL LVL3 (GOWN DISPOSABLE) ×2 IMPLANT
GOWN STRL REUS W/TWL LRG LVL3 (GOWN DISPOSABLE) ×4
GUIDEPIN 3.2X17.5 THRD DISP (PIN) ×1 IMPLANT
GUIDEWIRE BALL NOSE 100CM (WIRE) ×1 IMPLANT
HIP FRAC NAIL LAG SCR 10.5X100 (Orthopedic Implant) ×1 IMPLANT
KIT TURNOVER KIT B (KITS) ×2 IMPLANT
MANIFOLD NEPTUNE II (INSTRUMENTS) ×2 IMPLANT
MARKER SKIN DUAL TIP RULER LAB (MISCELLANEOUS) ×2 IMPLANT
NAIL HIP FRACT 130D 11X180 (Screw) ×1 IMPLANT
NS IRRIG 1000ML POUR BTL (IV SOLUTION) ×2 IMPLANT
PACK GENERAL/GYN (CUSTOM PROCEDURE TRAY) ×2 IMPLANT
PAD ARMBOARD 7.5X6 YLW CONV (MISCELLANEOUS) ×4 IMPLANT
PADDING CAST ABS 4INX4YD NS (CAST SUPPLIES)
PADDING CAST ABS COTTON 4X4 ST (CAST SUPPLIES) IMPLANT
SCREW BONE CORTICAL 5.0X3 (Screw) ×1 IMPLANT
SCREW CANN THRD AFF 10.5X100 (Orthopedic Implant) IMPLANT
SUT MNCRL AB 3-0 PS2 27 (SUTURE) ×2 IMPLANT
SUT MON AB 2-0 CT1 27 (SUTURE) ×2 IMPLANT
SUT VIC AB 1 CT1 27 (SUTURE) ×2
SUT VIC AB 1 CT1 27XBRD ANBCTR (SUTURE) ×1 IMPLANT
TOWEL OR 17X24 6PK STRL BLUE (TOWEL DISPOSABLE) ×2 IMPLANT
TOWEL OR 17X26 10 PK STRL BLUE (TOWEL DISPOSABLE) ×2 IMPLANT

## 2018-02-10 NOTE — Anesthesia Procedure Notes (Signed)
Procedure Name: Intubation Date/Time: 02/10/2018 12:46 PM Performed by: Jearld Pies, CRNA Pre-anesthesia Checklist: Patient identified, Emergency Drugs available, Suction available and Patient being monitored Patient Re-evaluated:Patient Re-evaluated prior to induction Oxygen Delivery Method: Circle System Utilized Preoxygenation: Pre-oxygenation with 100% oxygen Induction Type: IV induction Ventilation: Mask ventilation without difficulty and Oral airway inserted - appropriate to patient size Laryngoscope Size: Mac and 3 Grade View: Grade I Tube type: Oral Tube size: 7.0 mm Number of attempts: 1 Airway Equipment and Method: Stylet and Oral airway Placement Confirmation: ETT inserted through vocal cords under direct vision,  positive ETCO2 and breath sounds checked- equal and bilateral Secured at: 21 cm Tube secured with: Tape Dental Injury: Teeth and Oropharynx as per pre-operative assessment

## 2018-02-10 NOTE — Progress Notes (Addendum)
1124 Pt is A&O x4, NPO post midnight maint. CHG baths completed. Report was given to [facility] in short stay. Pt to short stay with family present. 1600 Received pt from PACU,  Sleeping, easy to awaken, disoriented. Reoriented pt. Left hip dressing dry and intact. Ice pack in place. No pain at this time.

## 2018-02-10 NOTE — Op Note (Addendum)
OPERATIVE REPORT  SURGEON: Rod Can, MD   ASSISTANT: Ky Barban, RNFA.  PREOPERATIVE DIAGNOSIS: Left intertrochanteric femur fracture.   POSTOPERATIVE DIAGNOSIS: Left intertrochanteric femur fracture.   PROCEDURE: Intramedullary fixation, Left femur.   IMPLANTS: Biomet Affixus Hip Fracture Nail, 11 by 180 mm, 130 degrees. 10.5 x 100 mm Hip Fracture Nail Lag Screw. 5 x 34 mm distal interlocking screw 1.  ANESTHESIA:  General  ESTIMATED BLOOD LOSS:-100 mL    ANTIBIOTICS: 2 g Ancef.  DRAINS: None.  COMPLICATIONS: None.   CONDITION: PACU - hemodynamically stable.   BRIEF CLINICAL NOTE: [name] is a 85 y.o. female who presented with an intertrochanteric femur fracture. The patient was admitted to the hospitalist service and underwent perioperative risk stratification and medical optimization. The risks, benefits, and alternatives to the procedure were explained, and the patient elected to proceed.  PROCEDURE IN DETAIL: Surgical site was marked by myself. The patient was taken to the operating room and anesthesia was induced on the bed. The patient was then transferred to the [facility] table and the nonoperative lower extremity was scissored underneath the operative side. The fracture was reduced with traction, internal rotation, and adduction. The hip was prepped and draped in the normal sterile surgical fashion. Timeout was called verifying side and site of surgery. Preop antibiotics were given with 60 minutes of beginning the procedure.  Fluoroscopy was used to define the patient's anatomy. A 4 cm incision was made just proximal to the tip of the greater trochanter. The awl was used to obtain the standard starting point for a trochanteric entry nail under fluoroscopic control. The guidepin was placed. The entry reamer was used to open the proximal femur.  On the back table, the nail was assembled onto the jig. The nail was placed into the femur without any difficulty.  Through a separate stab incision, the cannula was placed down to the bone in preparation for the cephalomedullary device. A guidepin was placed into the femoral head using AP and lateral fluoroscopy views. The pin was measured, and then reaming was performed to the appropriate depth. The lag screw was inserted to the appropriate depth. The fracture was compressed through the jig. The setscrew was tightened and then loosened one quarter turn. A separate stab incision was created, and the distal interlocking screw was placed using standard AO technique. The jig was removed. Final AP and lateral fluoroscopy views were obtained to confirm fracture reduction and hardware placement. Tip apex distance was appropriate. There was no chondral penetration.  The wounds were copiously irrigated with saline. The wound was closed in layers with #1 Vicryl for the fascia, 2-0 Monocryl for the deep dermal layer, and 3-0 Monocryl subcuticular stitch. Glue was applied to the skin. Once the glue was fully hardened, sterile dressing was applied. The patient was then awakened from anesthesia and taken to the PACU in stable condition. Sponge needle and instrument counts were correct at the end of the case 2. There were no known complications.  We will readmit the patient to the hospitalist. Weightbearing status will be weightbearing as tolerated with a walker. We will begin Lovenox for DVT prophylaxis. The patient will work with physical therapy and undergo disposition planning.

## 2018-02-10 NOTE — Progress Notes (Signed)
Patient is 85 year old female with past medical history of asthma, chronic diastolic congestive heart failure, CKD stage III, depression, hypertension, GERD, hyperlipidemia, paroxysmal A. fib who presented to the hospital after a mechanical fall at home.  Denies any dizziness, lightheadedness, chest pain or shortness of breath prior to fall.  Immediately developed excruciating pain on her left hip.  Imagings done on presentation showed moderately displaced and comminuted intertrochanteric proximal left femoral fracture.  Orthopedics consulted.  Planning for IM nailing of the left hip. Patient seen and examined at the bedside this morning.  He is hemodynamically stable.  Complains of mild to moderate pain. On examination, there is tenderness on the left hip and the left lower extremity is externally rotated. Patient seen by Dr. Marlowe Sax this morning.

## 2018-02-10 NOTE — H&P (Signed)
History and Physical    [name] JQB:[id number] DOB: [birth year] DOA: 02/09/2018  PCP: [name], MD Patient coming from: Home  Chief Complaint: Fall  HPI: [name] is a 85 y.o. female with medical history significant of asthma, chronic diastolic congestive heart failure, CKD stage III, depression, hypertension, GERD, hyperlipidemia, paroxysmal A. fib presenting to the hospital after a fall at home.  Patient states she was walking at home and tripped on a box and fell.  Daughter at bedside confirms that there were boxes on the floor with Christmas presents.  Denies having any dizziness/lightheadedness, chest pain, or shortness of breath prior to the fall.  Denies loss of consciousness and daughter at bedside confirms.  Patient denies hitting her head during the fall.  Denies having any headaches or vomiting.  Reports having excruciating pain in her left hip since she fell.  She does not use a walker or cane to ambulate at home.  Review of Systems: As per HPI otherwise 10 point review of systems negative.  Past Medical History:  Diagnosis Date  . Anemia    "I stay anemic"  . Arthritis   . Asthma    no problems recent  . Cataract   . Chronic diastolic CHF (congestive heart failure), NYHA class 2 ([place]) 03/19/2014   Echo 4/19: mod LVH, EF 55-60, no RWMA, Gr 1 DD, MAC, trivial MR, mod LAE, normal RVSF, PASP 19  . CKD (chronic kidney disease), stage III ([place])   . Depression   . Diverticulitis   . DVT (deep venous thrombosis) ([place])    from birth control, left groin  . Essential hypertension   . Fibromyalgia 10 y.a.  . GERD (gastroesophageal reflux disease)   . Gout   . Hx of cardiovascular stress test    Lexiscan Myoview (8/15): apical attenuation artifact, no ischemia, EF 69% - low risk.  Marland Kitchen Hypercholesteremia   . Odynophagia   . Other abnormal glucose   . PAF (paroxysmal atrial fibrillation) ([place])   . Personal history of other diseases of digestive system     . Pneumonia    hx of  . Sinus bradycardia   . Skin cancer 2014   skin R elbow & face  . Symptomatic bradycardia 06/2016    Past Surgical History:  Procedure Laterality Date  . ABDOMINAL HYSTERECTOMY     partial  . APPENDECTOMY  ~55years ago  . CHOLECYSTECTOMY  10/2005   Dr. Effie Shy  . COLONOSCOPY W/ BIOPSIES  2005, 2011   Dr. Bing Plume, "balloon inside for last one at [facility]"  . IR THORACENTESIS ASP PLEURAL SPACE W/IMG GUIDE  06/13/2017  . KNEE ARTHROSCOPY Right   . LAMINECTOMY  05/2007   foraminotomy, L4-5 laminectomy  . LARYNGOSCOPY / BRONCHOSCOPY / ESOPHAGOSCOPY  2010   Dr. Benjamine Mola  . PACEMAKER IMPLANT N/A 07/01/2016   Procedure: Pacemaker Implant;  Surgeon: Evans Lance, MD;  Location: [facility];  Service: Cardiovascular;  Laterality: N/A;  . SKIN CANCER EXCISION Right ~2005   r elbow  . TOTAL KNEE ARTHROPLASTY Left 04/04/2013   Procedure: LEFT TOTAL KNEE ARTHROPLASTY;  Surgeon: Tobi Bastos, MD;  Location: [facility];  Service: Orthopedics;  Laterality: Left;  . TUBAL LIGATION    . TUMOR REMOVAL Right ~ 20 years ago   tumor in between toes     reports that she quit smoking about 45 years ago. Her smoking use included cigarettes. She quit after 10.00 years of use. She has  never used smokeless tobacco. She reports that she does not drink alcohol or use drugs.  Allergies  Allergen Reactions  . Penicillins Rash    DID THE REACTION INVOLVE: Swelling of the face/tongue/throat, SOB, or low BP? Unknown Sudden or severe rash/hives, skin peeling, or the inside of the mouth or nose? Unknown Did it require medical treatment? Unknown When did it last happen?unknown If all above answers are "NO", may proceed with cephalosporin use.   Marland Kitchen Amoxicillin Other (See Comments)    Very weak Has patient had a PCN reaction causing immediate rash, facial/tongue/throat swelling, SOB or lightheadedness with hypotension: no Has patient had a PCN reaction causing severe  rash involving mucus membranes or skin necrosis: no Has patient had a PCN reaction that required hospitalization: no Has patient had a PCN reaction occurring within the last 10 years: no If all of the above answers are "NO", then may proceed with Cephalosporin use.   . Diltiazem Hcl Other (See Comments)    Reaction unknown  . Sulfonamide Derivatives Other (See Comments)    Reaction unknown  . Zetia [Ezetimibe] Other (See Comments)    Weakness    Family History  Problem Relation Age of Onset  . Heart disease Mother   . Hypertension Mother   . Fainting Mother   . Arrhythmia Mother   . Diabetes Father   . Stroke Father   . Hypertension Father   . Cancer Brother   . Sudden death Brother   . Heart attack Brother   . Cancer Sister   . Diabetes Sister   . Asthma Sister     Prior to Admission medications   Medication Sig Start Date End Date Taking? Authorizing Provider  acetaminophen (TYLENOL) 500 MG tablet Take 2 tablets (1,000 mg total) by mouth every 8 (eight) hours as needed for mild pain or headache. Patient taking differently: Take 500 mg by mouth every 8 (eight) hours as needed for mild pain or headache.  05/16/17  Yes Hongalgi, Lenis Dickinson, MD  albuterol (PROVENTIL HFA;VENTOLIN HFA) 108 (90 BASE) MCG/ACT inhaler Inhale 1 puff into the lungs every 6 (six) hours as needed for wheezing or shortness of breath. 11/13/14  Yes Dorothy Spark, MD  alendronate (FOSAMAX) 70 MG tablet Take 70 mg by mouth once a week. On Friday 02/13/14  Yes [provider]  allopurinol (ZYLOPRIM) 100 MG tablet Take 1 tablet (100 mg total) by mouth daily. 04/01/16  Yes Haney, Alyssa A, MD  amLODipine (NORVASC) 2.5 MG tablet Take 1 tablet (2.5 mg total) by mouth daily. 07/28/17  Yes Dorothy Spark, MD  aspirin EC 81 MG tablet Take 81 mg by mouth daily.    Yes [provider]  budesonide-formoterol (SYMBICORT) 80-4.5 MCG/ACT inhaler Inhale 2 puffs into the lungs 2 (two) times daily. 05/20/17   Yes Chesley Mires, MD  cholecalciferol (VITAMIN D) 1000 units tablet Take 1,000 Units by mouth daily.    Yes [provider]  famotidine (PEPCID) 20 MG tablet Take 20 mg by mouth 2 (two) times daily.   Yes [provider]  ferrous sulfate 325 (65 FE) MG tablet Take 325 mg by mouth daily.    Yes [provider]  montelukast (SINGULAIR) 10 MG tablet Take 10 mg by mouth at bedtime.     Yes [provider]  ondansetron (ZOFRAN) 4 MG tablet Take 4 mg by mouth every 8 (eight) hours as needed for nausea or vomiting.   Yes [provider]  pravastatin (  PRAVACHOL) 20 MG tablet Take 20 mg by mouth at bedtime.  09/17/14  Yes [provider]  sertraline (ZOLOFT) 50 MG tablet Take 50 mg by mouth daily.  03/15/12  Yes [provider]    Physical Exam: Vitals:   02/09/18 1845 02/09/18 1900 02/09/18 1930 02/09/18 2032  BP: (!) 154/67 (!) 153/61 (!) 154/59 (!) 175/61  Pulse: 61 61 62 62  Resp: _0 Temp:    98.3 F (36.8 C)  TempSrc:    Oral  SpO2: 98% 95% 97% 98%    Physical Exam  Constitutional: She is oriented to person, place, and time. She appears well-developed and well-nourished. No distress.  HENT:  Head: Normocephalic.  Slightly dry mucous membranes  Eyes: Right eye exhibits no discharge. Left eye exhibits no discharge.  Neck: Neck supple.  Cardiovascular: Normal rate, regular rhythm and intact distal pulses.  Pulmonary/Chest: Effort normal. No respiratory distress. She has no wheezes.  Anterior lung fields clear to auscultation.  Abdominal: Soft. Bowel sounds are normal. She exhibits no distension. There is no abdominal tenderness. There is no guarding.  Musculoskeletal:        General: No edema.     Comments: Left lower extremity shortened and externally rotated. Dorsalis pedis pulse intact.  Neurological: She is alert and oriented to person, place, and time.  Skin: Skin is warm and dry. She is not diaphoretic.    Psychiatric: She has a normal mood and affect. Her behavior is normal.     Labs on Admission: I have personally reviewed following labs and imaging studies  CBC: Recent Labs  Lab 02/09/18 1530 02/09/18 1544  WBC 4.9  --   NEUTROABS 4.0  --   HGB 9.8* 10.2*  HCT 30.8* 30.0*  MCV 99.7  --   PLT 123*  --    Basic Metabolic Panel: Recent Labs  Lab 02/09/18 1544  NA 141  K 4.1  CL 108  GLUCOSE 140*  BUN 20  CREATININE 1.10*   GFR: CrCl cannot be calculated (Unknown ideal weight.). Liver Function Tests: No results for input(s): AST, ALT, ALKPHOS, BILITOT, PROT, ALBUMIN in the last 168 hours. No results for input(s): LIPASE, AMYLASE in the last 168 hours. No results for input(s): AMMONIA in the last 168 hours. Coagulation Profile: No results for input(s): INR, PROTIME in the last 168 hours. Cardiac Enzymes: No results for input(s): CKTOTAL, CKMB, CKMBINDEX, TROPONINI in the last 168 hours. BNP (last 3 results) Recent Labs    05/18/17 1329 07/06/17 1000  PROBNP 1,189* 1,456*   HbA1C: No results for input(s): HGBA1C in the last 72 hours. CBG: No results for input(s): GLUCAP in the last 168 hours. Lipid Profile: No results for input(s): CHOL, HDL, LDLCALC, TRIG, CHOLHDL, LDLDIRECT in the last 72 hours. Thyroid Function Tests: No results for input(s): TSH, T4TOTAL, FREET4, T3FREE, THYROIDAB in the last 72 hours. Anemia Panel: No results for input(s): VITAMINB12, FOLATE, FERRITIN, TIBC, IRON, RETICCTPCT in the last 72 hours. Urine analysis:    Component Value Date/Time   COLORURINE YELLOW 05/14/2017 2053   APPEARANCEUR CLEAR 05/14/2017 2053   LABSPEC 1.010 05/14/2017 2053   PHURINE 5.0 05/14/2017 2053   GLUCOSEU NEGATIVE 05/14/2017 2053   HGBUR NEGATIVE 05/14/2017 2053   [place] NEGATIVE 05/14/2017 2053   [place] NEGATIVE 05/14/2017 2053   PROTEINUR NEGATIVE 05/14/2017 2053   UROBILINOGEN 0.2 07/20/2013 1457   NITRITE NEGATIVE 05/14/2017 2053    LEUKOCYTESUR NEGATIVE 05/14/2017 2053    Radiological Exams  on Admission: Dg Chest 1 View  Result Date: 02/09/2018 CLINICAL DATA:  Left hip fracture. EXAM: CHEST  1 VIEW COMPARISON:  Radiographs of September 28, 2017. FINDINGS: Stable cardiomegaly. Left-sided pacemaker is unchanged in position. No pneumothorax or pleural effusion is noted. Right lung is clear. Stable left midlung and basilar scarring is noted. No acute pulmonary disease is noted. Bony thorax is unremarkable. IMPRESSION: No acute cardiopulmonary abnormality seen. Electronically Signed   By: Marijo Conception, M.D.   On: 02/09/2018 15:27   Dg Lumbar Spine Complete  Result Date: 02/09/2018 CLINICAL DATA:  Left hip pain after fall. EXAM: LUMBAR SPINE - COMPLETE 4+ VIEW COMPARISON:  CT scan of November 04, 2017. FINDINGS: No fracture or spondylolisthesis is noted. Mild degenerative disc disease is noted at L1-2 with anterior osteophyte formation. Diffuse osteopenia is noted. Remaining disc spaces are unremarkable. Atherosclerosis of abdominal aorta is noted. IMPRESSION: Mild degenerative disc disease is noted at L1-2. No acute abnormality seen in the lumbar spine. Aortic Atherosclerosis (ICD10-I70.0). Electronically Signed   By: Marijo Conception, M.D.   On: 02/09/2018 15:22   Dg Knee Complete 4 Views Right  Result Date: 02/09/2018 CLINICAL DATA:  Right knee pain after fall today. EXAM: RIGHT KNEE - COMPLETE 4+ VIEW COMPARISON:  Radiographs of May 01, 2010. FINDINGS: No evidence of fracture, dislocation, or joint effusion. Moderate narrowing of medial joint space is noted with osteophyte formation. Soft tissues are unremarkable. IMPRESSION: Moderate degenerative joint disease is noted medially. No acute abnormality seen in the right knee. Electronically Signed   By: Marijo Conception, M.D.   On: 02/09/2018 15:24   Dg Hip Unilat W Or Wo Pelvis 2-3 Views Left  Result Date: 02/09/2018 CLINICAL DATA:  Left hip pain after fall today. EXAM: DG HIP  (WITH OR WITHOUT PELVIS) 2-3V LEFT COMPARISON:  None. FINDINGS: Moderately displaced and comminuted fracture is seen involving the intertrochanteric region of proximal left femur. Right hip joint appears normal. IMPRESSION: Moderately displaced and comminuted intertrochanteric proximal left femoral fracture. Electronically Signed   By: Marijo Conception, M.D.   On: 02/09/2018 15:20   Dg Femur Port Min 2 Views Left  Result Date: 02/09/2018 CLINICAL DATA:  Fall today with left flank pain. Intertrochanteric fracture on hip radiograph. EXAM: LEFT FEMUR PORTABLE 2 VIEWS COMPARISON:  Pelvis and left hip radiograph earlier this day. FINDINGS: Comminuted displaced intertrochanteric proximal femur fracture, better assessed on prior pelvis and hip exam. The distal femur is intact. No additional fracture. Total knee arthroplasty without apparent complication. No knee joint effusion. There are vascular calcifications. IMPRESSION: Comminuted displaced intertrochanteric femur fracture. Distal femur is intact. Electronically Signed   By: Keith Rake M.D.   On: 02/09/2018 19:05    EKG: Independently reviewed.  Atrially paced rhythm (heart rate 60), LAFB.  No acute changes.  Assessment/Plan Principal Problem:   Hip fracture (HCC) Active Problems:   Anemia, unspecified   Hypercholesteremia   GERD (gastroesophageal reflux disease)   Depression   Asthma   Gout   Chronic diastolic CHF (congestive heart failure), NYHA class 2 (H[facility])   Essential hypertension   PAF (paroxysmal atrial fibrillation) (HCC)   Thrombocytopenia (H[facility])   Left hip intertrochanteric fracture secondary to mechanical fall X-ray showing moderately displaced and comminuted intertrochanteric proximal left femoral fracture.  Distal femur intact.  Patient was seen by Ortho and will be taken to the OR 1/3 at noon for IM nailing of left hip intertrochanteric fracture by Dr. Lyla Glassing. -N.p.o. after midnight -  Pain management: Norco 5-325 mg 1 to 2  tablets every 6 hours as needed, morphine 0.5 mg every 3 hours as needed -IV fluid hydration -Nonweightbearing left lower extremity  Chronic anemia -Hemoglobin stable.  Continue home iron supplement.  Chronic thrombocytopenia -Platelet count 123,000, stable.  No signs of active bleeding.  Asthma -Stable.  No bronchospasm.  Continue home inhalers.  Continue Singulair.  Gout -Continue allopurinol  Hypertension -Continue amlodipine  GERD/ dyspepsia  -Continue H2 blocker -Continue Zofran PRN  Hyperlipidemia -Continue statin  Depression -Continue Zoloft  Paroxysmal A. Fib -CHA2DS2VAsc 5. S/p PPM.  Followed by cardiology and felt not to be an ideal candidate for anticoagulation.  Chronic diastolic congestive heart failure -No signs of volume overload at this time.  No diuretic listed in home meds.  DVT prophylaxis: SCDs Code Status: Full code.  Discussed with patient and daughter at bedside. Family Communication: Daughter at bedside. Disposition Plan: Anticipate discharge 1 to 2 days after surgery. Consults called: Orthopedics Admission status: It is my clinical opinion that admission to [facility] is reasonable and necessary in this 85 y.o. female . presenting with symptoms of left hip pain after mechanical fall, concerning for fracture . with pertinent positives on physical exam including: Left lower extremity shortened and externally rotated . and pertinent positives on radiographic and laboratory data including: Imaging with evidence of left hip intertrochanteric fracture . Workup and treatment include IM nailing of left hip intertrochanteric fracture by orthopedics on 1/3 at noon.  Continue to keep n.p.o., IV fluid hydration, pain management.  Given the aforementioned, the predictability of an adverse outcome is felt to be significant. I expect that the patient will require at least 2 midnights in the hospital to treat this condition.    Shela Leff MD [facility] Pager [phone]  If 7PM-7AM, please contact night-coverage [email or web address] Password [email or web address]  02/10/2018, 4:03 AM

## 2018-02-10 NOTE — Interval H&P Note (Signed)
History and Physical Interval Note:  02/10/2018 12:10 PM  [name]  has presented today for surgery, with the diagnosis of left intertroch fracture  The various methods of treatment have been discussed with the patient and family. After consideration of risks, benefits and other options for treatment, the patient has consented to  Procedure(s): INTRAMEDULLARY (IM) NAIL INTERTROCHANTRIC (Left) as a surgical intervention .  The patient's history has been reviewed, patient examined, no change in status, stable for surgery.  I have reviewed the patient's chart and labs.  Questions were answered to the patient's satisfaction.     Hilton Cork Pedro [place]

## 2018-02-10 NOTE — Transfer of Care (Signed)
Immediate Anesthesia Transfer of Care Note  Patient: [name]  Procedure(s) Performed: INTRAMEDULLARY (IM) NAIL INTERTROCHANTRIC (Left Hip)  Patient Location: PACU  Anesthesia Type:General  Level of Consciousness: drowsy and patient cooperative  Airway & Oxygen Therapy: Patient Spontanous Breathing and Patient connected to face mask oxygen  Post-op Assessment: Report given to RN and Post -op Vital signs reviewed and stable  Post vital signs: Reviewed and stable  Last Vitals:  Vitals Value Taken Time  BP 112/47 02/10/2018  2:23 PM  Temp    Pulse 61 02/10/2018  2:26 PM  Resp 15 02/10/2018  2:26 PM  SpO2 100 % 02/10/2018  2:26 PM  Vitals shown include unvalidated device data.  Last Pain:  Vitals:   02/10/18 1215  TempSrc:   PainSc: 8       Patients Stated Pain Goal: 3 (19/69/40 9828)  Complications: No apparent anesthesia complications

## 2018-02-10 NOTE — Anesthesia Postprocedure Evaluation (Signed)
Anesthesia Post Note  Patient: [name]  Procedure(s) Performed: INTRAMEDULLARY (IM) NAIL INTERTROCHANTRIC (Left Hip)     Patient location during evaluation: PACU Anesthesia Type: General Level of consciousness: awake and alert Pain management: pain level controlled Vital Signs Assessment: post-procedure vital signs reviewed and stable Respiratory status: spontaneous breathing, nonlabored ventilation and respiratory function stable Cardiovascular status: blood pressure returned to baseline and stable Postop Assessment: no apparent nausea or vomiting Anesthetic complications: no    Last Vitals:  Vitals:   02/10/18 1510 02/10/18 1525  BP: (!) 115/53 (!) 116/49  Pulse: 62 62  Resp: 13 17  Temp:    SpO2: 97% 99%    Last Pain:  Vitals:   02/10/18 1452  TempSrc:   PainSc: Asleep    LLE Motor Response: Purposeful movement;Responds to commands (02/10/18 1525) LLE Sensation: Full sensation (02/10/18 1525)          Brennan Bailey

## 2018-02-10 NOTE — Discharge Instructions (Signed)
 Dr. Sora Olivo Adult Hip & Knee Specialist [facility] [address] [place], [place] [address] [phone]   POSTOPERATIVE DIRECTIONS    Hip Rehabilitation, Guidelines Following Surgery   WEIGHT BEARING Weight bearing as tolerated with assist device (walker, cane, etc) as directed, use it as long as suggested by your surgeon or therapist, typically at least 4-6 weeks.   HOME CARE INSTRUCTIONS  Remove items at home which could result in a fall. This includes throw rugs or furniture in walking pathways.  Continue medications as instructed at time of discharge.  You may have some home medications which will be placed on hold until you complete the course of blood thinner medication.  4 days after discharge, you may start showering. No tub baths or soaking your incisions. Do not put on socks or shoes without following the instructions of your caregivers.   Sit on chairs with arms. Use the chair arms to help push yourself up when arising.  Arrange for the use of a toilet seat elevator so you are not sitting low.   Walk with walker as instructed.  You may resume a sexual relationship in one month or when given the OK by your caregiver.  Use walker as long as suggested by your caregivers.  Avoid periods of inactivity such as sitting longer than an hour when not asleep. This helps prevent blood clots.  You may return to work once you are cleared by your surgeon.  Do not drive a car for 6 weeks or until released by your surgeon.  Do not drive while taking narcotics.  Wear elastic stockings for two weeks following surgery during the day but you may remove then at night.  Make sure you keep all of your appointments after your operation with all of your doctors and caregivers. You should call the office at the above phone number and make an appointment for approximately two weeks after the date of your surgery. Please pick up a stool softener and laxative  for home use as long as you are requiring pain medications.  ICE to the affected hip every three hours for 30 minutes at a time and then as needed for pain and swelling. Continue to use ice on the hip for pain and swelling from surgery. You may notice swelling that will progress down to the foot and ankle.  This is normal after surgery.  Elevate the leg when you are not up walking on it.   It is important for you to complete the blood thinner medication as prescribed by your doctor.  Continue to use the breathing machine which will help keep your temperature down.  It is common for your temperature to cycle up and down following surgery, especially at night when you are not up moving around and exerting yourself.  The breathing machine keeps your lungs expanded and your temperature down.  RANGE OF MOTION AND STRENGTHENING EXERCISES  These exercises are designed to help you keep full movement of your hip joint. Follow your caregiver's or physical therapist's instructions. Perform all exercises about fifteen times, three times per day or as directed. Exercise both hips, even if you have had only one joint replacement. These exercises can be done on a training (exercise) mat, on the floor, on a table or on a bed. Use whatever works the best and is most comfortable for you. Use music or television while you are exercising so that the exercises are a pleasant break in your day. This   will make your life better with the exercises acting as a break in routine you can look forward to.  Lying on your back, slowly slide your foot toward your buttocks, raising your knee up off the floor. Then slowly slide your foot back down until your leg is straight again.  Lying on your back spread your legs as far apart as you can without causing discomfort.  Lying on your side, raise your upper leg and foot straight up from the floor as far as is comfortable. Slowly lower the leg and repeat.  Lying on your back, tighten up the  muscle in the front of your thigh (quadriceps muscles). You can do this by keeping your leg straight and trying to raise your heel off the floor. This helps strengthen the largest muscle supporting your knee.  Lying on your back, tighten up the muscles of your buttocks both with the legs straight and with the knee bent at a comfortable angle while keeping your heel on the floor.   SKILLED REHAB INSTRUCTIONS: If the patient is transferred to a skilled rehab facility following release from the hospital, a list of the current medications will be sent to the facility for the patient to continue.  When discharged from the skilled rehab facility, please have the facility set up the patient's Home Health Physical Therapy prior to being released. Also, the skilled facility will be responsible for providing the patient with their medications at time of release from the facility to include their pain medication and their blood thinner medication. If the patient is still at the rehab facility at time of the two week follow up appointment, the skilled rehab facility will also need to assist the patient in arranging follow up appointment in our office and any transportation needs.  MAKE SURE YOU:  Understand these instructions.  Will watch your condition.  Will get help right away if you are not doing well or get worse.  Pick up stool softner and laxative for home use following surgery while on pain medications. Daily dry dressing changes as needed. In 4 days, you may remove your dressings and begin taking showers - no tub baths or soaking the incisions. Continue to use ice for pain and swelling after surgery. Do not use any lotions or creams on the incision until instructed by your surgeon.   

## 2018-02-11 LAB — CBC WITH DIFFERENTIAL/PLATELET
Abs Immature Granulocytes: 0.04 10*3/uL (ref 0.00–0.07)
Basophils Absolute: 0 10*3/uL (ref 0.0–0.1)
Basophils Relative: 0 %
Eosinophils Absolute: 0 10*3/uL (ref 0.0–0.5)
Eosinophils Relative: 0 %
HCT: 25.7 % — ABNORMAL LOW (ref 36.0–46.0)
Hemoglobin: 7.9 g/dL — ABNORMAL LOW (ref 12.0–15.0)
Immature Granulocytes: 1 %
Lymphocytes Relative: 9 %
Lymphs Abs: 0.7 10*3/uL (ref 0.7–4.0)
MCH: 31.3 pg (ref 26.0–34.0)
MCHC: 30.7 g/dL (ref 30.0–36.0)
MCV: 102 fL — ABNORMAL HIGH (ref 80.0–100.0)
MONOS PCT: 10 %
Monocytes Absolute: 0.8 10*3/uL (ref 0.1–1.0)
Neutro Abs: 5.9 10*3/uL (ref 1.7–7.7)
Neutrophils Relative %: 80 %
PLATELETS: 107 10*3/uL — AB (ref 150–400)
RBC: 2.52 MIL/uL — ABNORMAL LOW (ref 3.87–5.11)
RDW: 14.3 % (ref 11.5–15.5)
WBC: 7.4 10*3/uL (ref 4.0–10.5)
nRBC: 0 % (ref 0.0–0.2)

## 2018-02-11 LAB — BASIC METABOLIC PANEL
Anion gap: 8 (ref 5–15)
BUN: 20 mg/dL (ref 8–23)
CO2: 22 mmol/L (ref 22–32)
Calcium: 8 mg/dL — ABNORMAL LOW (ref 8.9–10.3)
Chloride: 105 mmol/L (ref 98–111)
Creatinine, Ser: 1.09 mg/dL — ABNORMAL HIGH (ref 0.44–1.00)
GFR calc Af Amer: 52 mL/min — ABNORMAL LOW (ref >60)
GFR calc non Af Amer: 45 mL/min — ABNORMAL LOW (ref >60)
Glucose, Bld: 143 mg/dL — ABNORMAL HIGH (ref 70–99)
Potassium: 4.3 mmol/L (ref 3.5–5.1)
Sodium: 135 mmol/L (ref 135–145)

## 2018-02-11 MED ORDER — GUAIFENESIN-DM 100-10 MG/5ML PO SYRP
5.0000 mL | ORAL_SOLUTION | ORAL | Status: DC | PRN
  Administered 2018-02-11: 5 mL via ORAL
  Filled 2018-02-11 (×2): qty 5

## 2018-02-11 MED ORDER — QUETIAPINE FUMARATE 25 MG PO TABS
25.0000 mg | ORAL_TABLET | Freq: Every day | ORAL | Status: DC
  Administered 2018-02-11 – 2018-02-12 (×2): 25 mg via ORAL
  Filled 2018-02-11 (×5): qty 1

## 2018-02-11 MED ORDER — ACETAMINOPHEN 325 MG PO TABS
650.0000 mg | ORAL_TABLET | Freq: Four times a day (QID) | ORAL | Status: DC | PRN
  Administered 2018-02-11 – 2018-02-16 (×9): 650 mg via ORAL
  Filled 2018-02-11 (×9): qty 2

## 2018-02-11 MED ORDER — HALOPERIDOL LACTATE 5 MG/ML IJ SOLN: 1.0000 mg | Freq: Four times a day (QID) | INTRAMUSCULAR | Status: DC | PRN

## 2018-02-11 NOTE — Progress Notes (Signed)
PROGRESS NOTE    [name]  NTI:[id number] DOB: [birth year] DOA: 02/09/2018 PCP: [name], MD   Brief Narrative: Patient is 85 year old female with past medical history of asthma, chronic diastolic congestive heart failure, CKD stage III, depression, hypertension, GERD, hyperlipidemia, paroxysmal A. fib who presented to the hospital after a mechanical fall at home.  Denies any dizziness, lightheadedness, chest pain or shortness of breath prior to fall.  Immediately developed excruciating pain on her left hip.  Imagings done on presentation showed moderately displaced and comminuted intertrochanteric proximal left femoral fracture.  Orthopedics consulted and she underwent Intramedullary fixation on 02/10/18.  Assessment & Plan:   Principal Problem:   Hip fracture ([place]) Active Problems:   Anemia, unspecified   Hypercholesteremia   GERD (gastroesophageal reflux disease)   Depression   Asthma   Gout   Chronic diastolic CHF (congestive heart failure), NYHA class 2 (HCC)   Essential hypertension   PAF (paroxysmal atrial fibrillation) (HCC)   Thrombocytopenia (HCC)   Closed comminuted intertrochanteric fracture of left femur (HCC)  Left hip intertrochanteric fracture: Secondary to mechanical fall.  Underwent intramedullary nailing by orthopedics.  PT/OT recommended skilled nursing facility.  Social worker consulted.  Continue pain management.  Continue DVT prophylaxis with Lovenox.  Chronic anemia: Hemoglobin dropped to 7.9.  Most likely associated with surgery.  We will check hemoglobin tomorrow.  Thrombocytopenia: Has history of chronic thrombocytopenia.  Currently stable.  Asthma: Currently stable.  Continue home inhalers.  Continue Singulair.  Gout: Continue allopurinol  Hypertension: Currently on amlodipine.  Will monitor her blood pressure.  GERD: Continue H2 blocker.  Hyperlipidemia: Continue statin  Depression :continue Zoloft  Proximal A. QQP:[id number]  5. S/p PPM.  Followed by cardiology and felt not to be an ideal candidate for anticoagulation.  Currently rate is controlled  Chronic diastolic congestive failure: No signs of volume overload at this time.  Not  On Diuretics.  Dementia/delirium/agitation: Patient noted to be agitated and confused this morning.  Might have history of dementia.  Currently in delirium secondary to hospitalization, surgical intervention.  Continue to monitor mental status.  Continue PRN Haldol for severe agitation.  Started on Seroquel.  Make her room breath with daylight.  Encourage family involvement          DVT prophylaxis: Lovenox Code Status: Full Family Communication: None present at the bedside Disposition Plan: Skilled nursing facility soon as the bed is available   Consultants: Orthopedics  Procedures: Intramedullary nailing  Antimicrobials: None  Subjective: Patient seen and examined bedside this morning.  Hemodynamically stable.  Agitated and was trying to get out of the bed.  Pain looks well controlled.  Objective: Vitals:   02/11/18 0816 02/11/18 0857 02/11/18 0917 02/11/18 1020  BP:  126/67 (!) 114/54   Pulse:  93 87   Resp:      Temp:  98.5 F (36.9 C) 100.3 F (37.9 C) 99.9 F (37.7 C)  TempSrc:  Oral Rectal Rectal  SpO2: 92% 94% 95%   Weight:      Height:        Intake/Output Summary (Last 24 hours) at 02/11/2018 1158 Last data filed at 02/11/2018 1100 Gross per 24 hour  Intake 1655 ml  Output 325 ml  Net 1330 ml   Filed Weights   02/10/18 1048  Weight: 74 kg    Examination:  General exam: Agitated, elderly lady  HEENT:PERRL,Oral mucosa moist, Ear/Nose normal on gross exam Respiratory system: Bilateral equal air entry, normal vesicular breath sounds, no  wheezes or crackles  Cardiovascular system: S1 & S2 heard, RRR. No JVD, murmurs, rubs, gallops or clicks. No pedal edema. Gastrointestinal system: Abdomen is nondistended, soft and nontender. No organomegaly or  masses felt. Normal bowel sounds heard. Central nervous system: Alert but not oriented. No focal neurological deficits. Extremities: No edema, no clubbing ,no cyanosis, distal peripheral pulses palpable.  Clean surgical wound on the left hip Skin: No rashes, lesions or ulcers,no icterus ,no pallor MSK: Normal muscle bulk,tone ,power     Data Reviewed: I have personally reviewed following labs and imaging studies  CBC: Recent Labs  Lab 02/09/18 1530 02/09/18 1544 02/11/18 0239  WBC 4.9  --  7.4  NEUTROABS 4.0  --  5.9  HGB 9.8* 10.2* 7.9*  HCT 30.8* 30.0* 25.7*  MCV 99.7  --  102.0*  PLT 123*  --  852*   Basic Metabolic Panel: Recent Labs  Lab 02/09/18 1544 02/11/18 0239  NA 141 135  K 4.1 4.3  CL 108 105  CO2  --  22  GLUCOSE 140* 143*  BUN 20 20  CREATININE 1.10* 1.09*  CALCIUM  --  8.0*   GFR: Estimated Creatinine Clearance: 35.1 mL/min (A) (by C-G formula based on SCr of 1.09 mg/dL (H)). Liver Function Tests: No results for input(s): AST, ALT, ALKPHOS, BILITOT, PROT, ALBUMIN in the last 168 hours. No results for input(s): LIPASE, AMYLASE in the last 168 hours. No results for input(s): AMMONIA in the last 168 hours. Coagulation Profile: No results for input(s): INR, PROTIME in the last 168 hours. Cardiac Enzymes: No results for input(s): CKTOTAL, CKMB, CKMBINDEX, TROPONINI in the last 168 hours. BNP (last 3 results) Recent Labs    05/18/17 1329 07/06/17 1000  PROBNP 1,189* 1,456*   HbA1C: No results for input(s): HGBA1C in the last 72 hours. CBG: No results for input(s): GLUCAP in the last 168 hours. Lipid Profile: No results for input(s): CHOL, HDL, LDLCALC, TRIG, CHOLHDL, LDLDIRECT in the last 72 hours. Thyroid Function Tests: No results for input(s): TSH, T4TOTAL, FREET4, T3FREE, THYROIDAB in the last 72 hours. Anemia Panel: No results for input(s): VITAMINB12, FOLATE, FERRITIN, TIBC, IRON, RETICCTPCT in the last 72 hours. Sepsis Labs: No results  for input(s): PROCALCITON, LATICACIDVEN in the last 168 hours.  Recent Results (from the past 240 hour(s))  Surgical pcr screen     Status: None   Collection Time: 02/10/18  1:00 AM  Result Value Ref Range Status   MRSA, PCR NEGATIVE NEGATIVE Final   Staphylococcus aureus NEGATIVE NEGATIVE Final    Comment: (NOTE) The Xpert SA Assay (FDA approved for NASAL specimens in patients 76 years of age and older), is one component of a comprehensive surveillance program. It is not intended to diagnose infection nor to guide or monitor treatment. Performed at [facility], [place] [address], [place], [place] [address]          Radiology Studies: Dg Chest 1 View  Result Date: 02/09/2018 CLINICAL DATA:  Left hip fracture. EXAM: CHEST  1 VIEW COMPARISON:  Radiographs of September 28, 2017. FINDINGS: Stable cardiomegaly. Left-sided pacemaker is unchanged in position. No pneumothorax or pleural effusion is noted. Right lung is clear. Stable left midlung and basilar scarring is noted. No acute pulmonary disease is noted. Bony thorax is unremarkable. IMPRESSION: No acute cardiopulmonary abnormality seen. Electronically Signed   By: Marijo Conception, M.D.   On: 02/09/2018 15:27   Dg Lumbar Spine Complete  Result Date: 02/09/2018 CLINICAL DATA:  Left hip pain after fall. EXAM: LUMBAR SPINE - COMPLETE 4+ VIEW COMPARISON:  CT scan of November 04, 2017. FINDINGS: No fracture or spondylolisthesis is noted. Mild degenerative disc disease is noted at L1-2 with anterior osteophyte formation. Diffuse osteopenia is noted. Remaining disc spaces are unremarkable. Atherosclerosis of abdominal aorta is noted. IMPRESSION: Mild degenerative disc disease is noted at L1-2. No acute abnormality seen in the lumbar spine. Aortic Atherosclerosis (ICD10-I70.0). Electronically Signed   By: Marijo Conception, M.D.   On: 02/09/2018 15:22   Pelvis Portable  Result Date: 02/10/2018 CLINICAL DATA:  Closed comminuted intertrochanteric  fracture of left femur. EXAM: PORTABLE PELVIS 1-2 VIEWS COMPARISON:  02/09/2018 pelvis/hip radiographs. 02/10/2018 intraoperative fluoroscopic images. FINDINGS: The previously demonstrated comminuted intertrochanteric left femur fracture demonstrates improved position and alignment compared to the preoperative radiographs. An intramedullary nail is in place with proximal and distal interlocking screws. The femoral heads are approximated with the acetabula on this single AP projection. IMPRESSION: ORIF of comminuted intertrochanteric left femur fracture. Electronically Signed   By: Logan Bores M.D.   On: 02/10/2018 15:34   Dg Knee Complete 4 Views Right  Result Date: 02/09/2018 CLINICAL DATA:  Right knee pain after fall today. EXAM: RIGHT KNEE - COMPLETE 4+ VIEW COMPARISON:  Radiographs of May 01, 2010. FINDINGS: No evidence of fracture, dislocation, or joint effusion. Moderate narrowing of medial joint space is noted with osteophyte formation. Soft tissues are unremarkable. IMPRESSION: Moderate degenerative joint disease is noted medially. No acute abnormality seen in the right knee. Electronically Signed   By: Marijo Conception, M.D.   On: 02/09/2018 15:24   Dg C-arm 1-60 Min  Result Date: 02/10/2018 CLINICAL DATA:  ORIF EXAM: DG C-ARM 61-120 MIN; OPERATIVE LEFT HIP WITH PELVIS COMPARISON:  02/09/2018. FINDINGS: ORIF left hip.  Hardware intact.  Anatomic alignment. IMPRESSION: ORIF left hip with anatomic alignment. Electronically Signed   By: Marcello Moores  Register   On: 02/10/2018 13:57   Dg Hip Operative Unilat W Or W/o Pelvis Left  Result Date: 02/10/2018 CLINICAL DATA:  ORIF EXAM: DG C-ARM 61-120 MIN; OPERATIVE LEFT HIP WITH PELVIS COMPARISON:  02/09/2018. FINDINGS: ORIF left hip.  Hardware intact.  Anatomic alignment. IMPRESSION: ORIF left hip with anatomic alignment. Electronically Signed   By: Marcello Moores  Register   On: 02/10/2018 13:57   Dg Hip Unilat W Or Wo Pelvis 2-3 Views Left  Result Date:  02/09/2018 CLINICAL DATA:  Left hip pain after fall today. EXAM: DG HIP (WITH OR WITHOUT PELVIS) 2-3V LEFT COMPARISON:  None. FINDINGS: Moderately displaced and comminuted fracture is seen involving the intertrochanteric region of proximal left femur. Right hip joint appears normal. IMPRESSION: Moderately displaced and comminuted intertrochanteric proximal left femoral fracture. Electronically Signed   By: Marijo Conception, M.D.   On: 02/09/2018 15:20   Dg Femur Port Min 2 Views Left  Result Date: 02/09/2018 CLINICAL DATA:  Fall today with left flank pain. Intertrochanteric fracture on hip radiograph. EXAM: LEFT FEMUR PORTABLE 2 VIEWS COMPARISON:  Pelvis and left hip radiograph earlier this day. FINDINGS: Comminuted displaced intertrochanteric proximal femur fracture, better assessed on prior pelvis and hip exam. The distal femur is intact. No additional fracture. Total knee arthroplasty without apparent complication. No knee joint effusion. There are vascular calcifications. IMPRESSION: Comminuted displaced intertrochanteric femur fracture. Distal femur is intact. Electronically Signed   By: Keith Rake M.D.   On: 02/09/2018 19:05        Scheduled Meds: . allopurinol  100 mg Oral  Daily  . amLODipine  2.5 mg Oral Daily  . cholecalciferol  1,000 Units Oral Daily  . docusate sodium  100 mg Oral BID  . enoxaparin (LOVENOX) injection  40 mg Subcutaneous Q24H  . famotidine  20 mg Oral BID  . ferrous sulfate  325 mg Oral Daily  . mometasone-formoterol  2 puff Inhalation BID  . montelukast  10 mg Oral QHS  . pravastatin  20 mg Oral QHS  . QUEtiapine  25 mg Oral QHS  . senna  1 tablet Oral BID  . sertraline  50 mg Oral Daily   Continuous Infusions:   LOS: 2 days    Time spent:35 mins. More than 50% of that time was spent in counseling and/or coordination of care.      Shelly Coss, MD [facility] Pager [phone]  If 7PM-7AM, please contact  night-coverage [email or web address] Password [email or web address] 02/11/2018, 11:58 AM

## 2018-02-11 NOTE — Progress Notes (Signed)
   Subjective:  Patient reports pain as moderate.  She is complaining of some soreness in the left that thigh.  Otherwise denies shortness of breath, chest pain, nausea, vomiting.  Objective:   VITALS:   Vitals:   02/10/18 2316 02/11/18 0438 02/11/18 0603 02/11/18 0816  BP: 127/62 (!) 121/46 (!) 131/52   Pulse: 62 68 70   Resp:      Temp: 98.3 F (36.8 C) 98.6 F (37 C)    TempSrc: Oral Oral    SpO2: 98% 96%  92%  Weight:      Height:        Neurologically intact Neurovascular intact Sensation intact distally Intact pulses distally Dorsiflexion/Plantar flexion intact Incision: dressing C/D/I Compartment soft   Lab Results  Component Value Date   WBC 7.4 02/11/2018   HGB 7.9 (L) 02/11/2018   HCT 25.7 (L) 02/11/2018   MCV 102.0 (H) 02/11/2018   PLT 107 (L) 02/11/2018   BMET    Component Value Date/Time   NA 135 02/11/2018 0239   NA 142 07/25/2017 0000   K 4.3 02/11/2018 0239   CL 105 02/11/2018 0239   CO2 22 02/11/2018 0239   GLUCOSE 143 (H) 02/11/2018 0239   BUN 20 02/11/2018 0239   BUN 30 (H) 07/25/2017 0000   CREATININE 1.09 (H) 02/11/2018 0239   CALCIUM 8.0 (L) 02/11/2018 0239   GFRNONAA 45 (L) 02/11/2018 0239   GFRAA 52 (L) 02/11/2018 0239     Assessment/Plan: 1 Day Post-Op   Principal Problem:   Hip fracture (HCC) Active Problems:   Anemia, unspecified   Hypercholesteremia   GERD (gastroesophageal reflux disease)   Depression   Asthma   Gout   Chronic diastolic CHF (congestive heart failure), NYHA class 2 (HCC)   Essential hypertension   PAF (paroxysmal atrial fibrillation) (HCC)   Thrombocytopenia (HCC)   Closed comminuted intertrochanteric fracture of left femur (HCC)   Up with therapy -Weightbearing as tolerated with a walker -DVT prophylaxis with Lovenox daily -Maintain postoperative bandages may exchange on an as-needed basis if soiled.   Nicholes Stairs 02/11/2018, 8:21 AM   Geralynn Rile, MD [phone]

## 2018-02-11 NOTE — Progress Notes (Signed)
Pt holding bladder area and groaning in pain. Bladder scanned pt 0 ml . Pain medicine given seems to be hip pain. From surgery. MASD under fold of abdomen noted. Cleansed and foam applied. Will continue to monitor.

## 2018-02-11 NOTE — Evaluation (Signed)
Occupational Therapy Evaluation Patient Details Name: [name] MRN: [id number] DOB: 02/20/1927 Today's Date: 02/11/2018    History of Present Illness Pt is an 85 y.o. female with medical history significant of asthma, chronic diastolic congestive heart failure, CKD stage III, depression, hypertension, GERD, hyperlipidemia, and paroxysmal A. fib.  She presented to the ED after a fall at home.  Xray revealed L hip fx. She underwent IM nailing 02/10/18.    Clinical Impression   PTA, pt was living with her daughter and was performing ADLs and light IADLs. Pt currently requires Min A for UB ADLs, Max A for LB ADLs, and Min-Mod A for bed mobility. Pt attempting to perform functional transfers and was resistant due to pain at LLE. Pt presenting with decreased cognition impacting her functional performance and safety awareness. Pt participating in BLE exercises while seated at EOB. Pt would benefit from further acute OT to facilitate safe dc. Recommend dc to SNF for further OT to optimize safety, independence with ADLs, and return to PLOF.      Follow Up Recommendations  SNF;Supervision/Assistance - 24 hour    Equipment Recommendations  None recommended by OT    Recommendations for Other Services PT consult     Precautions / Restrictions Precautions Precautions: Fall Restrictions Weight Bearing Restrictions: Yes LLE Weight Bearing: Weight bearing as tolerated      Mobility Bed Mobility Overal bed mobility: Needs Assistance Bed Mobility: Supine to Sit;Sit to Supine     Supine to sit: Min assist;HOB elevated Sit to supine: Mod assist   General bed mobility comments: Min A to transition hips towards EOB with bed pad. Pt able to bring BLEs over EOB to return to bed, but required Mod A to faciltiate safe descent of trunk due to pain  Transfers Overall transfer level: Needs assistance Equipment used: Rolling walker (2 wheeled) Transfers: Sit to/from Stand Sit to Stand: Total  assist;From elevated surface         General transfer comment: Cues for hand placement and weight shift. However, pt resisting to stand due to pain stating "I just can't do this right now."    Balance Overall balance assessment: Needs assistance Sitting-balance support: Feet supported;Single extremity supported Sitting balance-Leahy Scale: Fair     Standing balance support: Bilateral upper extremity supported;During functional activity Standing balance-Leahy Scale: Poor Standing balance comment: attempting sit<>stand x3 and pt resistant due to pain                            ADL either performed or assessed with clinical judgement   ADL Overall ADL's : Needs assistance/impaired Eating/Feeding: Set up;Supervision/ safety;Bed level(bed in chair position) Eating/Feeding Details (indicate cue type and reason): Providing set up and son provising supervision to eat lunch while seated in chair position Grooming: Set up;Supervision/safety;Sitting   Upper Body Bathing: Minimal assistance;Sitting   Lower Body Bathing: Maximal assistance;Bed level   Upper Body Dressing : Minimal assistance;Sitting   Lower Body Dressing: Maximal assistance;Bed level Lower Body Dressing Details (indicate cue type and reason): Pt able to bend forward to pull up left sock while seated at EOB. However, pt with decreased tolerance for standing this session due to pain and cognition. Pt will requiring increased assistance for LB dressing due to pain             Functional mobility during ADLs: Total assistance;Rolling walker(attempting sit<>stand x3 and pt resistant) General ADL Comments: functional performance significantly impacted by cognitive  status, pain, and activity tolerance. Pt very determined to perform activities independently and motivated to participate in therapy     Vision Baseline Vision/History: Wears glasses Patient Visual Report: No change from baseline       Perception      Praxis      Pertinent Vitals/Pain Pain Assessment: Faces Faces Pain Scale: Hurts even more Pain Location: L hip during mobility/WB Pain Descriptors / Indicators: Grimacing;Guarding Pain Intervention(s): Monitored during session;Limited activity within patient's tolerance;Repositioned     Hand Dominance Right   Extremity/Trunk Assessment Upper Extremity Assessment Upper Extremity Assessment: Overall WFL for tasks assessed   Lower Extremity Assessment Lower Extremity Assessment: Defer to PT evaluation LLE Deficits / Details: expected deficits following L hip sx, ROM intact at ankle LLE: Unable to fully assess due to pain   Cervical / Trunk Assessment Cervical / Trunk Assessment: Kyphotic   Communication Communication Communication: No difficulties   Cognition Arousal/Alertness: Awake/alert Behavior During Therapy: WFL for tasks assessed/performed;Flat affect Overall Cognitive Status: Impaired/Different from baseline Area of Impairment: Orientation;Attention;Following commands;Safety/judgement;Problem solving                 Orientation Level: Disoriented to;Time;Situation Current Attention Level: Sustained   Following Commands: Follows one step commands with increased time Safety/Judgement: Decreased awareness of safety   Problem Solving: Slow processing;Difficulty sequencing;Requires verbal cues General Comments: Son confirming this is not baseline cognition and pt is confused. Pt oriented to self and place. However, requiring cues to state day of week and year (pt stating "1920"). Pt requiring increased cues and significant time throughout session.    General Comments  Son present throughout session. Pt SpO2 at 98% on RA throughout activity.    Exercises Exercises: General Lower Extremity General Exercises - Lower Extremity Long Arc Quad: AROM;Both;10 reps;Seated Hip Flexion/Marching: AROM;Right;10 reps;Seated   Shoulder Instructions      Home Living  Family/patient expects to be discharged to:: Private residence Living Arrangements: Children(Daighter and son-in-law) Available Help at Discharge: Family;Friend(s);Available 24 hours/day Type of Home: House Home Access: Stairs to enter [employer] of Steps: 2 Entrance Stairs-Rails: Right Home Layout: One level     Bathroom Shower/Tub: Tub/shower unit;Walk-in shower   Bathroom Toilet: Handicapped height     Home Equipment: Environmental consultant - 2 wheels;Cane - single point;Shower seat   Additional Comments: Son confirms information about home      Prior Functioning/Environment Level of Independence: Independent        Comments: ADLs, light IADLs, and son reports she is "suppose to use a RW"        OT Problem List: Decreased strength;Decreased range of motion;Decreased activity tolerance;Impaired balance (sitting and/or standing);Decreased knowledge of use of DME or AE;Decreased knowledge of precautions;Decreased cognition;Decreased safety awareness;Pain      OT Treatment/Interventions: Self-care/ADL training;Therapeutic exercise;Energy conservation;DME and/or AE instruction;Therapeutic activities;Patient/family education    OT Goals(Current goals can be found in the care plan section) Acute Rehab OT Goals Patient Stated Goal: "Return to walking" OT Goal Formulation: With patient/family Time For Goal Achievement: 02/25/18 Potential to Achieve Goals: Good  OT Frequency: Min 2X/week   Barriers to D/C:            Co-evaluation              AM-PAC OT "6 Clicks" Daily Activity     Outcome Measure Help from another person eating meals?: None Help from another person taking care of personal grooming?: A Little Help from another person toileting, which includes using toliet, bedpan, or  urinal?: A Lot Help from another person bathing (including washing, rinsing, drying)?: A Lot Help from another person to put on and taking off regular upper body clothing?: A  Little Help from another person to put on and taking off regular lower body clothing?: A Lot 6 Click Score: 16   End of Session Equipment Utilized During Treatment: Gait belt;Rolling walker Nurse Communication: Mobility status;Precautions;Weight bearing status  Activity Tolerance: Patient tolerated treatment well;Patient limited by pain Patient left: in bed;with call bell/phone within reach;with bed alarm set;with family/visitor present(bed in chair position)  OT Visit Diagnosis: Unsteadiness on feet (R26.81);Other abnormalities of gait and mobility (R26.89);Muscle weakness (generalized) (M62.81);Other symptoms and signs involving cognitive function;Pain Pain - Right/Left: Left Pain - part of body: Leg                Time: 0488-8916 OT Time Calculation (min): 26 min Charges:  OT General Charges $OT Visit: 1 Visit OT Evaluation $OT Eval Moderate Complexity: 1 Mod OT Treatments $Self Care/Home Management : 8-22 mins  Carlton Buskey MSOT, OTR/L Acute Rehab Pager: [phone] Office: [place] 02/11/2018, 12:46 PM

## 2018-02-11 NOTE — Evaluation (Addendum)
Physical Therapy Evaluation Patient Details Name: [name] MRN: [id number] DOB: 04-Jul-1927 Today's Date: 02/11/2018   History of Present Illness  Pt is an 85 y.o. female with medical history significant of asthma, chronic diastolic congestive heart failure, CKD stage III, depression, hypertension, GERD, hyperlipidemia, and paroxysmal A. fib.  She presented to the ED after a fall at home.  Xray revealed L hip fx. She underwent IM nailing 02/10/18.     Clinical Impression  Pt admitted with above diagnosis. Pt currently with functional limitations due to the deficits listed below (see PT Problem List). PTA pt lived at home with her daughter, independent with mobility and ADLs. On eval, she required min/mod assist bed mobility, mod assist sit to stand and mod assist sidestepping 3' with RW. Pt became diaphoretic during session, profuse sweating, pale and clammy. BP 127/67 EOB, SpO2 94% on RA, and 86 HR. Pt returned to supine and BP 114/54. RN aware and present in room. Pt will benefit from skilled PT to increase their independence and safety with mobility to allow discharge to the venue listed below.       Follow Up Recommendations SNF;Supervision/Assistance - 24 hour    Equipment Recommendations  Rolling walker with 5" wheels;3in1 (PT)    Recommendations for Other Services       Precautions / Restrictions Precautions Precautions: Fall Restrictions LLE Weight Bearing: Weight bearing as tolerated      Mobility  Bed Mobility Overal bed mobility: Needs Assistance Bed Mobility: Supine to Sit;Sit to Supine     Supine to sit: Min assist;HOB elevated Sit to supine: Mod assist;+2 for physical assistance   General bed mobility comments: +rail, cues for sequencing, use of bed pad to scoot to EOB, +2 total assist using bed pad to scoot up in bed  Transfers Overall transfer level: Needs assistance Equipment used: Rolling walker (2 wheeled) Transfers: Sit to/from Stand Sit to Stand:  Mod assist         General transfer comment: cues for hand placement, increased time and effort. Unable to attain full, upright stance, remaining in squat position.  Ambulation/Gait Ambulation/Gait assistance: Mod assist Gait Distance (Feet): 3 Feet Assistive device: Rolling walker (2 wheeled) Gait Pattern/deviations: Step-to pattern;Shuffle;Trunk flexed     General Gait Details: sidestepping with RW toward [facility] for better positioning. Side shuffle steps with continuous verbal cues. Assist to maintain balance and manage RW.   Stairs            Wheelchair Mobility    Modified Rankin (Stroke Patients Only)       Balance Overall balance assessment: Needs assistance Sitting-balance support: Feet supported;Single extremity supported Sitting balance-Leahy Scale: Fair     Standing balance support: Bilateral upper extremity supported;During functional activity Standing balance-Leahy Scale: Poor Standing balance comment: reliant on RW and external support                             Pertinent Vitals/Pain Pain Assessment: Faces Faces Pain Scale: Hurts even more Pain Location: L hip during mobility/WB Pain Descriptors / Indicators: Grimacing;Guarding Pain Intervention(s): Monitored during session;Limited activity within patient's tolerance;Repositioned    Home Living Family/patient expects to be discharged to:: Private residence Living Arrangements: Children(daughter and son in [employer]) Available Help at Discharge: Family;Friend(s);Available 24 hours/day Type of Home: House Home Access: Stairs to enter Entrance Stairs-Rails: Right Entrance Stairs-Number of Steps: 2 Home Layout: One level Home Equipment: Walker - 2 wheels;Cane - single point;Shower seat  Additional Comments: Pt is a poor historian. Information taken from previous admission.    Prior Function Level of Independence: Independent         Comments: amb without AD     Hand Dominance    Dominant Hand: Right    Extremity/Trunk Assessment   Upper Extremity Assessment Upper Extremity Assessment: Defer to OT evaluation    Lower Extremity Assessment Lower Extremity Assessment: LLE deficits/detail LLE Deficits / Details: expected deficits following L hip sx, ROM intact at ankle LLE: Unable to fully assess due to pain    Cervical / Trunk Assessment Cervical / Trunk Assessment: Kyphotic  Communication   Communication: No difficulties  Cognition Arousal/Alertness: Awake/alert Behavior During Therapy: Restless(trying to climb out of bed) Overall Cognitive Status: No family/caregiver present to determine baseline cognitive functioning Area of Impairment: Orientation;Attention;Following commands;Safety/judgement;Problem solving                 Orientation Level: Disoriented to;Time;Situation Current Attention Level: Sustained   Following Commands: Follows one step commands with increased time Safety/Judgement: Decreased awareness of safety   Problem Solving: Slow processing;Difficulty sequencing;Requires verbal cues General Comments: Pt confused attempting to climb out of bed, stating she needed to go to work.       General Comments General comments (skin integrity, edema, etc.): Pt diaphoretic upon initiation of mobility. BP 127/67 sitting EOB and 114/54 upon return to supine. Skin pale and clammy. Temp 100.3. Pt on RA during mobility with SpO2 94% and HR 86. 2 L O2 replaced upon return to bed.     Exercises     Assessment/Plan    PT Assessment Patient needs continued PT services  PT Problem List Decreased strength;Decreased balance;Decreased cognition;Pain;Decreased knowledge of precautions;Decreased mobility;Decreased knowledge of use of DME;Decreased safety awareness;Decreased activity tolerance       PT Treatment Interventions DME instruction;Functional mobility training;Balance training;Patient/family education;Gait training;Therapeutic  activities;Therapeutic exercise;Cognitive remediation    PT Goals (Current goals can be found in the Care Plan section)  Acute Rehab PT Goals Patient Stated Goal: not stated PT Goal Formulation: Patient unable to participate in goal setting Time For Goal Achievement: 02/25/18 Potential to Achieve Goals: Good    Frequency Min 3x/week   Barriers to discharge        Co-evaluation               AM-PAC PT "6 Clicks" Mobility  Outcome Measure Help needed turning from your back to your side while in a flat bed without using bedrails?: A Lot Help needed moving from lying on your back to sitting on the side of a flat bed without using bedrails?: A Little Help needed moving to and from a bed to a chair (including a wheelchair)?: A Lot Help needed standing up from a chair using your arms (e.g., wheelchair or bedside chair)?: A Lot Help needed to walk in hospital room?: Total Help needed climbing 3-5 steps with a railing? : Total 6 Click Score: 11    End of Session Equipment Utilized During Treatment: Gait belt;Oxygen Activity Tolerance: Treatment limited secondary to medical complications (Comment)(diaphoretic, confusion, Hgb 7.9) Patient left: in bed;with call bell/phone within reach;with nursing/sitter in room;with bed alarm set;Other (comment)(fall mats in place) Nurse Communication: Mobility status PT Visit Diagnosis: Other abnormalities of gait and mobility (R26.89);Muscle weakness (generalized) (M62.81);Difficulty in walking, not elsewhere classified (R26.2);Pain Pain - Right/Left: Left Pain - part of body: Hip    Time: 1610-9604 PT Time Calculation (min) (ACUTE ONLY): 26 min   Charges:  PT Evaluation $PT Eval Moderate Complexity: 1 Mod PT Treatments $Therapeutic Activity: 8-22 mins        Lorrin Goodell, PT  Office # [phone] Pager [phone]   Lorriane Shire 02/11/2018, 9:52 AM

## 2018-02-11 NOTE — Progress Notes (Signed)
Pt does not have any tylennol ordered, MD paged for order awaiting call back

## 2018-02-11 NOTE — Plan of Care (Signed)
  Problem: Health Behavior/Discharge Planning: Goal: Ability to manage health-related needs will improve Outcome: Progressing   Problem: Clinical Measurements: Goal: Will remain free from infection Outcome: Progressing   Problem: Pain Managment: Goal: General experience of comfort will improve Outcome: Progressing   

## 2018-02-11 NOTE — Progress Notes (Signed)
Pt pale and clammy, temp of 100.3, AOx1 to self which was baseline this AM. RN notified MD will continue to monitor
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
## 2018-02-12 LAB — ABO/RH: ABO/RH(D): O NEG

## 2018-02-12 LAB — BASIC METABOLIC PANEL
Anion gap: 8 (ref 5–15)
BUN: 26 mg/dL — ABNORMAL HIGH (ref 8–23)
CO2: 22 mmol/L (ref 22–32)
Calcium: 8.1 mg/dL — ABNORMAL LOW (ref 8.9–10.3)
Chloride: 105 mmol/L (ref 98–111)
Creatinine, Ser: 1.19 mg/dL — ABNORMAL HIGH (ref 0.44–1.00)
GFR calc Af Amer: 47 mL/min — ABNORMAL LOW (ref >60)
GFR calc non Af Amer: 41 mL/min — ABNORMAL LOW (ref >60)
Glucose, Bld: 139 mg/dL — ABNORMAL HIGH (ref 70–99)
Potassium: 4 mmol/L (ref 3.5–5.1)
Sodium: 135 mmol/L (ref 135–145)

## 2018-02-12 LAB — CBC
HEMATOCRIT: 22.3 % — AB (ref 36.0–46.0)
Hemoglobin: 7.1 g/dL — ABNORMAL LOW (ref 12.0–15.0)
MCH: 31.6 pg (ref 26.0–34.0)
MCHC: 31.8 g/dL (ref 30.0–36.0)
MCV: 99.1 fL (ref 80.0–100.0)
Platelets: 106 10*3/uL — ABNORMAL LOW (ref 150–400)
RBC: 2.25 MIL/uL — ABNORMAL LOW (ref 3.87–5.11)
RDW: 14.3 % (ref 11.5–15.5)
WBC: 5.7 10*3/uL (ref 4.0–10.5)
nRBC: 0 % (ref 0.0–0.2)

## 2018-02-12 LAB — PREPARE RBC (CROSSMATCH)

## 2018-02-12 MED ORDER — SODIUM CHLORIDE 0.9% IV SOLUTION
Freq: Once | INTRAVENOUS | Status: AC
  Administered 2018-02-12: 12:00:00 via INTRAVENOUS

## 2018-02-12 NOTE — Progress Notes (Signed)
Pt daughter [name] would like social worker to give her a call so she can let her know what facility she wants her mom to go her cell phone number Is on file but of she does not answer try [phone] (push [phone]) and they will get her a message.

## 2018-02-12 NOTE — Progress Notes (Signed)
Subjective: 2 Days Post-Op Procedure(s) (LRB): INTRAMEDULLARY (IM) NAIL INTERTROCHANTRIC (Left) Patient reports pain as 2 on 0-10 scale. HBG 7.1.Will repeat.Her vitals are stable.   Objective: Vital signs in last 24 hours: Temp:  [98.4 F (36.9 C)-100.3 F (37.9 C)] 98.7 F (37.1 C) (01/05 0423) Pulse Rate:  [70-93] 70 (01/05 0423) BP: (114-126)/(49-67) 125/49 (01/05 0423) SpO2:  [83 %-95 %] 83 % (01/05 0717)  Intake/Output from previous day: 01/04 0701 - 01/05 0700 In: 240 [P.O.:240] Out: 500 [Urine:500] Intake/Output this shift: No intake/output data recorded.  Recent Labs    02/09/18 1530 02/09/18 1544 02/11/18 0239 02/12/18 0534  HGB 9.8* 10.2* 7.9* 7.1*   Recent Labs    02/11/18 0239 02/12/18 0534  WBC 7.4 5.7  RBC 2.52* 2.25*  HCT 25.7* 22.3*  PLT 107* 106*   Recent Labs    02/11/18 0239 02/12/18 0534  NA 135 135  K 4.3 4.0  CL 105 105  CO2 22 22  BUN 20 26*  CREATININE 1.09* 1.19*  GLUCOSE 143* 139*  CALCIUM 8.0* 8.1*   No results for input(s): LABPT, INR in the last 72 hours.  Neurovascular intact    Assessment/Plan: 2 Days Post-Op Procedure(s) (LRB): INTRAMEDULLARY (IM) NAIL INTERTROCHANTRIC (Left) Up with therapy .HBG 7.1. Will Repeat.    Latanya Maudlin 02/12/2018, 8:39 AM

## 2018-02-12 NOTE — Progress Notes (Signed)
PROGRESS NOTE    [name]  IZT:[id number] DOB: 07-01-1927 DOA: 02/09/2018 PCP: [name], MD   Brief Narrative: Patient is 85 year old female with past medical history of asthma, chronic diastolic congestive heart failure, CKD stage III, depression, hypertension, GERD, hyperlipidemia, paroxysmal A. fib who presented to the hospital after a mechanical fall at home.  Denies any dizziness, lightheadedness, chest pain or shortness of breath prior to fall.  Immediately developed excruciating pain on her left hip.  Imagings done on presentation showed moderately displaced and comminuted intertrochanteric proximal left femoral fracture.  Orthopedics consulted and she underwent Intramedullary fixation on 02/10/18.Plan to discharge her to skilled facility tomorrow.  Assessment & Plan:   Principal Problem:   Hip fracture ([place]) Active Problems:   Anemia, unspecified   Hypercholesteremia   GERD (gastroesophageal reflux disease)   Depression   Asthma   Gout   Chronic diastolic CHF (congestive heart failure), NYHA class 2 (HCC)   Essential hypertension   PAF (paroxysmal atrial fibrillation) (HCC)   Thrombocytopenia (HCC)   Closed comminuted intertrochanteric fracture of left femur (HCC)  Left hip intertrochanteric fracture: Secondary to mechanical fall.  Underwent intramedullary nailing by orthopedics.  PT/OT recommended skilled nursing facility.  Social worker consulted.  Continue pain management.  Continue DVT prophylaxis with Lovenox.  Acute on Chronic anemia: Hemoglobin dropped to 7.1.  Most likely associated with surgery.  We will transfuse her with 1 unit of PRBC today.  Will check CBC tomorrow.  Thrombocytopenia: Has history of chronic thrombocytopenia.  Currently stable.  Asthma: Currently stable.  Continue home inhalers.  Continue Singulair.  Gout: Continue allopurinol  Hypertension: Currently on amlodipine.  Will monitor her blood pressure.  GERD: Continue H2  blocker.  Hyperlipidemia: Continue statin  Depression :continue Zoloft  Proximal A. JAS:[id number] 5. S/p PPM.  Followed by cardiology and felt not to be an ideal candidate for anticoagulation.  Currently rate is controlled  Chronic diastolic congestive failure: No signs of volume overload at this time.  Not  On Diuretics.  Dementia/delirium/agitation: Mental status better this morning. She has history of delirium after surgical interventions.  Currently in delirium secondary to hospitalization, surgical intervention.  Continue to monitor mental status.  Continue PRN Haldol for severe agitation.  Started on Seroquel.  Make her room bright with daylight.  Encourage family involvement .          DVT prophylaxis: Lovenox Code Status: Full Family Communication: Daughter present at the bedside Disposition Plan: Skilled nursing facility tomorrow   Consultants: Orthopedics  Procedures: Intramedullary nailing  Antimicrobials: None  Subjective: Patient seen and examined bedside this morning.  Hemodynamically stable.  More calmer today.  Pain looks well controlled.  Objective: Vitals:   02/11/18 1924 02/12/18 0423 02/12/18 0717 02/12/18 0836  BP: (!) 125/52 (!) 125/49  (!) 109/41  Pulse: 79 70    Resp:      Temp: 99.3 F (37.4 C) 98.7 F (37.1 C)    TempSrc: Oral Axillary    SpO2: 95% 95% (!) 83%   Weight:      Height:        Intake/Output Summary (Last 24 hours) at 02/12/2018 1101 Last data filed at 02/12/2018 9379 Gross per 24 hour  Intake -  Output 500 ml  Net -500 ml   Filed Weights   02/10/18 1048  Weight: 74 kg    Examination:  General exam: Calm, elderly lady  HEENT:PERRL,Oral mucosa moist, Ear/Nose normal on gross exam Respiratory system: Bilateral equal air entry,  normal vesicular breath sounds, no wheezes or crackles  Cardiovascular system: S1 & S2 heard, RRR. No JVD, murmurs, rubs, gallops or clicks. No pedal edema. Gastrointestinal system: Abdomen is  nondistended, soft and nontender. No organomegaly or masses felt. Normal bowel sounds heard. Central nervous system: Alert but not oriented. No focal neurological deficits. Extremities: No edema, no clubbing ,no cyanosis, distal peripheral pulses palpable.  Clean surgical wound on the left hip Skin: No rashes, lesions or ulcers,no icterus ,no pallor MSK: Normal muscle bulk,tone ,power     Data Reviewed: I have personally reviewed following labs and imaging studies  CBC: Recent Labs  Lab 02/09/18 1530 02/09/18 1544 02/11/18 0239 02/12/18 0534  WBC 4.9  --  7.4 5.7  NEUTROABS 4.0  --  5.9  --   HGB 9.8* 10.2* 7.9* 7.1*  HCT 30.8* 30.0* 25.7* 22.3*  MCV 99.7  --  102.0* 99.1  PLT 123*  --  107* 812*   Basic Metabolic Panel: Recent Labs  Lab 02/09/18 1544 02/11/18 0239 02/12/18 0534  NA 141 135 135  K 4.1 4.3 4.0  CL 108 105 105  CO2  --  22 22  GLUCOSE 140* 143* 139*  BUN 20 20 26*  CREATININE 1.10* 1.09* 1.19*  CALCIUM  --  8.0* 8.1*   GFR: Estimated Creatinine Clearance: 32.2 mL/min (A) (by C-G formula based on SCr of 1.19 mg/dL (H)). Liver Function Tests: No results for input(s): AST, ALT, ALKPHOS, BILITOT, PROT, ALBUMIN in the last 168 hours. No results for input(s): LIPASE, AMYLASE in the last 168 hours. No results for input(s): AMMONIA in the last 168 hours. Coagulation Profile: No results for input(s): INR, PROTIME in the last 168 hours. Cardiac Enzymes: No results for input(s): CKTOTAL, CKMB, CKMBINDEX, TROPONINI in the last 168 hours. BNP (last 3 results) Recent Labs    05/18/17 1329 07/06/17 1000  PROBNP 1,189* 1,456*   HbA1C: No results for input(s): HGBA1C in the last 72 hours. CBG: No results for input(s): GLUCAP in the last 168 hours. Lipid Profile: No results for input(s): CHOL, HDL, LDLCALC, TRIG, CHOLHDL, LDLDIRECT in the last 72 hours. Thyroid Function Tests: No results for input(s): TSH, T4TOTAL, FREET4, T3FREE, THYROIDAB in the last 72  hours. Anemia Panel: No results for input(s): VITAMINB12, FOLATE, FERRITIN, TIBC, IRON, RETICCTPCT in the last 72 hours. Sepsis Labs: No results for input(s): PROCALCITON, LATICACIDVEN in the last 168 hours.  Recent Results (from the past 240 hour(s))  Surgical pcr screen     Status: None   Collection Time: 02/10/18  1:00 AM  Result Value Ref Range Status   MRSA, PCR NEGATIVE NEGATIVE Final   Staphylococcus aureus NEGATIVE NEGATIVE Final    Comment: (NOTE) The Xpert SA Assay (FDA approved for NASAL specimens in patients 56 years of age and older), is one component of a comprehensive surveillance program. It is not intended to diagnose infection nor to guide or monitor treatment. Performed at [facility], [place] [address] [place], [place] [address]          Radiology Studies: Pelvis Portable  Result Date: 02/10/2018 CLINICAL DATA:  Closed comminuted intertrochanteric fracture of left femur. EXAM: PORTABLE PELVIS 1-2 VIEWS COMPARISON:  02/09/2018 pelvis/hip radiographs. 02/10/2018 intraoperative fluoroscopic images. FINDINGS: The previously demonstrated comminuted intertrochanteric left femur fracture demonstrates improved position and alignment compared to the preoperative radiographs. An intramedullary nail is in place with proximal and distal interlocking screws. The femoral heads are approximated with the acetabula on this single AP  projection. IMPRESSION: ORIF of comminuted intertrochanteric left femur fracture. Electronically Signed   By: Logan Bores M.D.   On: 02/10/2018 15:34   Dg C-arm 1-60 Min  Result Date: 02/10/2018 CLINICAL DATA:  ORIF EXAM: DG C-ARM 61-120 MIN; OPERATIVE LEFT HIP WITH PELVIS COMPARISON:  02/09/2018. FINDINGS: ORIF left hip.  Hardware intact.  Anatomic alignment. IMPRESSION: ORIF left hip with anatomic alignment. Electronically Signed   By: Marcello Moores  Register   On: 02/10/2018 13:57   Dg Hip Operative Unilat W Or W/o Pelvis Left  Result Date:  02/10/2018 CLINICAL DATA:  ORIF EXAM: DG C-ARM 61-120 MIN; OPERATIVE LEFT HIP WITH PELVIS COMPARISON:  02/09/2018. FINDINGS: ORIF left hip.  Hardware intact.  Anatomic alignment. IMPRESSION: ORIF left hip with anatomic alignment. Electronically Signed   By: Marcello Moores  Register   On: 02/10/2018 13:57        Scheduled Meds: . sodium chloride   Intravenous Once  . allopurinol  100 mg Oral Daily  . amLODipine  2.5 mg Oral Daily  . cholecalciferol  1,000 Units Oral Daily  . docusate sodium  100 mg Oral BID  . enoxaparin (LOVENOX) injection  40 mg Subcutaneous Q24H  . famotidine  20 mg Oral BID  . ferrous sulfate  325 mg Oral Daily  . mometasone-formoterol  2 puff Inhalation BID  . montelukast  10 mg Oral QHS  . pravastatin  20 mg Oral QHS  . QUEtiapine  25 mg Oral QHS  . senna  1 tablet Oral BID  . sertraline  50 mg Oral Daily   Continuous Infusions:   LOS: 3 days    Time spent:35 mins. More than 50% of that time was spent in counseling and/or coordination of care.      Shelly Coss, MD [facility] Pager [phone]  If 7PM-7AM, please contact night-coverage [email or web address] Password [email or web address] 02/12/2018, 11:01 AM

## 2018-02-12 NOTE — Plan of Care (Signed)
  Problem: Coping: Goal: Level of anxiety will decrease Outcome: Progressing   Problem: Pain Managment: Goal: General experience of comfort will improve Outcome: Progressing   Problem: Safety: Goal: Ability to remain free from injury will improve Outcome: Progressing   Problem: Skin Integrity: Goal: Risk for impaired skin integrity will decrease Outcome: Progressing   

## 2018-02-12 NOTE — Progress Notes (Signed)
Performed I+O cath on pt. Pt tolerated procedure well. Obtained 565ml of urine.

## 2018-02-12 NOTE — Plan of Care (Signed)

## 2018-02-13 ENCOUNTER — Encounter (HOSPITAL_COMMUNITY): Payer: Self-pay | Admitting: Orthopedic Surgery

## 2018-02-13 LAB — CBC WITH DIFFERENTIAL/PLATELET
Abs Immature Granulocytes: 0.02 10*3/uL (ref 0.00–0.07)
Basophils Absolute: 0 10*3/uL (ref 0.0–0.1)
Basophils Relative: 0 %
Eosinophils Absolute: 0.1 10*3/uL (ref 0.0–0.5)
Eosinophils Relative: 2 %
HCT: 24.5 % — ABNORMAL LOW (ref 36.0–46.0)
Hemoglobin: 7.9 g/dL — ABNORMAL LOW (ref 12.0–15.0)
Immature Granulocytes: 0 %
Lymphocytes Relative: 14 %
Lymphs Abs: 0.7 10*3/uL (ref 0.7–4.0)
MCH: 31.2 pg (ref 26.0–34.0)
MCHC: 32.2 g/dL (ref 30.0–36.0)
MCV: 96.8 fL (ref 80.0–100.0)
Monocytes Absolute: 0.5 10*3/uL (ref 0.1–1.0)
Monocytes Relative: 10 %
NEUTROS PCT: 74 %
Neutro Abs: 3.7 10*3/uL (ref 1.7–7.7)
Platelets: 117 10*3/uL — ABNORMAL LOW (ref 150–400)
RBC: 2.53 MIL/uL — ABNORMAL LOW (ref 3.87–5.11)
RDW: 15.5 % (ref 11.5–15.5)
WBC: 5 10*3/uL (ref 4.0–10.5)
nRBC: 0 % (ref 0.0–0.2)

## 2018-02-13 LAB — BPAM RBC
Blood Product Expiration Date: 202001242359
ISSUE DATE / TIME: 202001051414
Unit Type and Rh: 9500

## 2018-02-13 LAB — TYPE AND SCREEN
ABO/RH(D): O NEG
Antibody Screen: NEGATIVE
Unit division: 0

## 2018-02-13 MED ORDER — QUETIAPINE FUMARATE 25 MG PO TABS: 25.0000 mg | ORAL_TABLET | Freq: Every day | ORAL | 0 refills | Status: DC

## 2018-02-13 MED ORDER — HYDROCODONE-ACETAMINOPHEN 5-325 MG PO TABS: 1.0000 | ORAL_TABLET | Freq: Four times a day (QID) | ORAL | 0 refills | Status: DC | PRN

## 2018-02-13 MED ORDER — ENOXAPARIN SODIUM 40 MG/0.4ML ~~LOC~~ SOLN: 40.0000 mg | SUBCUTANEOUS | Status: DC

## 2018-02-13 NOTE — Progress Notes (Signed)
CSW lvm with patient's daughter [name] to consult regarding SNF discharge plan.   CSW will continue to follow up.   [place], [place]

## 2018-02-13 NOTE — Progress Notes (Signed)
CSW spoke with patient sister [name] at bedside to consult regarding lack of [employer] coverage for her preference [facility]. Family would be required to pay 50% of bill with [facility] since [facility] is out of network, [name] reports they are financially unable to do this.   CSW provided [place] with list of SNF that accept [employer] insurance which include [facility], [facility], [facility], [place], or [facility].   [name] reports she will be touring [place] this afternoon "in a few hours". CSW encouraged her to tour as soon as possible so that [employer] authorization could be initiated at [employer].   CSW will continue to follow up.   [place], [place]

## 2018-02-13 NOTE — Care Management Important Message (Signed)
Important Message  Patient Details  Name: ANJALEE COPE MRN: [id number] Date of Birth: 1927-10-22   Medicare Important Message Given:  Yes    Lineth Thielke 02/13/2018, 4:08 PM

## 2018-02-13 NOTE — Discharge Summary (Addendum)
Physician Discharge Summary  [name] WGY:[id number] DOB: Jun 24, 1927 DOA: 02/09/2018  PCP: [name], MD  Admit date: 02/09/2018 Discharge date: 02/16/18 Admitted From: Home Disposition:  Home  Discharge Condition:Stable CODE STATUS:FULL Diet recommendation: Heart Healthy  Brief/Interim Summary:  Patient is 85 year old female with past medical history of asthma, chronic diastolic congestive heart failure,CKD stage III, depression, hypertension, GERD, hyperlipidemia, paroxysmal A. fib who presented to the hospital after a mechanical fall at home. Denies any dizziness, lightheadedness, chest pain or shortness of breath prior to fall. Immediately developed excruciating pain on her left hip. Imagings done on presentation showed moderately displaced and comminuted intertrochanteric proximal left femoral fracture. Orthopedics consulted and she underwent Intramedullary fixation on 02/10/18.  She is hemodynamically stable for discharge to skilled nursing facility today.  Following problems were addressed during her hospitalization:  Left hip intertrochanteric fracture: Secondary to mechanical fall.  Underwent intramedullary nailing by orthopedics.  PT/OT recommended skilled nursing facility.  Social worker consulted.  Continue pain management.  Continue DVT prophylaxis with Lovenox.  Acute on Chronic anemia: Hemoglobin dropped to 7.1.  Most likely associated with surgery. Transfused her with 1 unit of PRBC  With Hb of 7.9.  Thrombocytopenia: Has history of chronic thrombocytopenia.  Currently stable.  Asthma: Currently stable.  Continue home inhalers.  Continue Singulair.  Gout: Continue allopurinol  Hypertension: Currently on amlodipine.    GERD: Continue H2 blocker.  Hyperlipidemia: Continue statin  Depression :continue Zoloft  Proximal A. XBL:[id number] 5. S/p PPM.Followed by cardiology and felt not to be an ideal candidate for anticoagulation.  Currently  rate is controlled  Chronic diastolic congestive failure: No signs of volume overload at this time.  Not  On Diuretics.  Dementia/delirium/agitation: Mental status better this morning. She has history of delirium after surgical interventions. Developed  delirium secondary to hospitalization, surgical intervention.   Started on Seroquel. Make her room bright with daylight.  Encourage family involvement .     Discharge Diagnoses:  Principal Problem:   Hip fracture ([place]) Active Problems:   Anemia, unspecified   Hypercholesteremia   GERD (gastroesophageal reflux disease)   Depression   Asthma   Gout   Chronic diastolic CHF (congestive heart failure), NYHA class 2 (HCC)   Essential hypertension   PAF (paroxysmal atrial fibrillation) (HCC)   Thrombocytopenia (HCC)   Closed comminuted intertrochanteric fracture of left femur [facility])    Discharge Instructions  Discharge Instructions    Diet - low sodium heart healthy   Complete by:  As directed    Discharge instructions   Complete by:  As directed    1)Follow up with orthopedics in 2 weeks. 2)Take prescribed medication as instructed. 3)Check CBC in a week.   Increase activity slowly   Complete by:  As directed      Allergies as of 02/13/2018      Reactions   Penicillins Rash   DID THE REACTION INVOLVE: Swelling of the face/tongue/throat, SOB, or low BP? Unknown Sudden or severe rash/hives, skin peeling, or the inside of the mouth or nose? Unknown Did it require medical treatment? Unknown When did it last happen?unknown If all above answers are "NO", may proceed with cephalosporin use.   Amoxicillin Other (See Comments)   Very weak Has patient had a PCN reaction causing immediate rash, facial/tongue/throat swelling, SOB or lightheadedness with hypotension: no Has patient had a PCN reaction causing severe rash involving mucus membranes or skin necrosis: no Has patient had a PCN reaction that required hospitalization:  no Has  patient had a PCN reaction occurring within the last 10 years: no If all of the above answers are "NO", then may proceed with Cephalosporin use.   Diltiazem Hcl Other (See Comments)   Reaction unknown   Sulfonamide Derivatives Other (See Comments)   Reaction unknown   Zetia [ezetimibe] Other (See Comments)   Weakness      Medication List    TAKE these medications   acetaminophen 500 MG tablet Commonly known as:  TYLENOL Take 2 tablets (1,000 mg total) by mouth every 8 (eight) hours as needed for mild pain or headache. What changed:  how much to take   albuterol 108 (90 Base) MCG/ACT inhaler Commonly known as:  PROVENTIL HFA;VENTOLIN HFA Inhale 1 puff into the lungs every 6 (six) hours as needed for wheezing or shortness of breath.   alendronate 70 MG tablet Commonly known as:  FOSAMAX Take 70 mg by mouth once a week. On Friday   allopurinol 100 MG tablet Commonly known as:  ZYLOPRIM Take 1 tablet (100 mg total) by mouth daily.   amLODipine 2.5 MG tablet Commonly known as:  NORVASC Take 1 tablet (2.5 mg total) by mouth daily.   aspirin EC 81 MG tablet Take 81 mg by mouth daily.   budesonide-formoterol 80-4.5 MCG/ACT inhaler Commonly known as:  SYMBICORT Inhale 2 puffs into the lungs 2 (two) times daily.   cholecalciferol 1000 units tablet Commonly known as:  VITAMIN D Take 1,000 Units by mouth daily.   famotidine 20 MG tablet Commonly known as:  PEPCID Take 20 mg by mouth 2 (two) times daily.   ferrous sulfate 325 (65 FE) MG tablet Take 325 mg by mouth daily.   HYDROcodone-acetaminophen 5-325 MG tablet Commonly known as:  NORCO/VICODIN Take 1-2 tablets by mouth every 6 (six) hours as needed for moderate pain.   montelukast 10 MG tablet Commonly known as:  SINGULAIR Take 10 mg by mouth at bedtime.   ondansetron 4 MG tablet Commonly known as:  ZOFRAN Take 4 mg by mouth every 8 (eight) hours as needed for nausea or vomiting.   pravastatin 20 MG  tablet Commonly known as:  PRAVACHOL Take 20 mg by mouth at bedtime.   QUEtiapine 25 MG tablet Commonly known as:  SEROQUEL Take 1 tablet (25 mg total) by mouth at bedtime.   sertraline 50 MG tablet Commonly known as:  ZOLOFT Take 50 mg by mouth daily.      Follow-up Information    Swinteck, Aaron Edelman, MD. Schedule an appointment as soon as possible for a visit in 2 weeks.   Specialty:  Orthopedic Surgery Why:  For wound re-check Contact information: [address] [place] [address] [phone]          Allergies  Allergen Reactions  . Penicillins Rash    DID THE REACTION INVOLVE: Swelling of the face/tongue/throat, SOB, or low BP? Unknown Sudden or severe rash/hives, skin peeling, or the inside of the mouth or nose? Unknown Did it require medical treatment? Unknown When did it last happen?unknown If all above answers are "NO", may proceed with cephalosporin use.   Marland Kitchen Amoxicillin Other (See Comments)    Very weak Has patient had a PCN reaction causing immediate rash, facial/tongue/throat swelling, SOB or lightheadedness with hypotension: no Has patient had a PCN reaction causing severe rash involving mucus membranes or skin necrosis: no Has patient had a PCN reaction that required hospitalization: no Has patient had a PCN reaction occurring within the last 10 years:  no If all of the above answers are "NO", then may proceed with Cephalosporin use.   . Diltiazem Hcl Other (See Comments)    Reaction unknown  . Sulfonamide Derivatives Other (See Comments)    Reaction unknown  . Zetia [Ezetimibe] Other (See Comments)    Weakness    Consultations:  Orthopedics   Procedures/Studies: Dg Chest 1 View  Result Date: 02/09/2018 CLINICAL DATA:  Left hip fracture. EXAM: CHEST  1 VIEW COMPARISON:  Radiographs of September 28, 2017. FINDINGS: Stable cardiomegaly. Left-sided pacemaker is unchanged in position. No pneumothorax or pleural effusion is noted.  Right lung is clear. Stable left midlung and basilar scarring is noted. No acute pulmonary disease is noted. Bony thorax is unremarkable. IMPRESSION: No acute cardiopulmonary abnormality seen. Electronically Signed   By: Marijo Conception, M.D.   On: 02/09/2018 15:27   Dg Lumbar Spine Complete  Result Date: 02/09/2018 CLINICAL DATA:  Left hip pain after fall. EXAM: LUMBAR SPINE - COMPLETE 4+ VIEW COMPARISON:  CT scan of November 04, 2017. FINDINGS: No fracture or spondylolisthesis is noted. Mild degenerative disc disease is noted at L1-2 with anterior osteophyte formation. Diffuse osteopenia is noted. Remaining disc spaces are unremarkable. Atherosclerosis of abdominal aorta is noted. IMPRESSION: Mild degenerative disc disease is noted at L1-2. No acute abnormality seen in the lumbar spine. Aortic Atherosclerosis (ICD10-I70.0). Electronically Signed   By: Marijo Conception, M.D.   On: 02/09/2018 15:22   Pelvis Portable  Result Date: 02/10/2018 CLINICAL DATA:  Closed comminuted intertrochanteric fracture of left femur. EXAM: PORTABLE PELVIS 1-2 VIEWS COMPARISON:  02/09/2018 pelvis/hip radiographs. 02/10/2018 intraoperative fluoroscopic images. FINDINGS: The previously demonstrated comminuted intertrochanteric left femur fracture demonstrates improved position and alignment compared to the preoperative radiographs. An intramedullary nail is in place with proximal and distal interlocking screws. The femoral heads are approximated with the acetabula on this single AP projection. IMPRESSION: ORIF of comminuted intertrochanteric left femur fracture. Electronically Signed   By: Logan Bores M.D.   On: 02/10/2018 15:34   Dg Knee Complete 4 Views Right  Result Date: 02/09/2018 CLINICAL DATA:  Right knee pain after fall today. EXAM: RIGHT KNEE - COMPLETE 4+ VIEW COMPARISON:  Radiographs of May 01, 2010. FINDINGS: No evidence of fracture, dislocation, or joint effusion. Moderate narrowing of medial joint space is noted  with osteophyte formation. Soft tissues are unremarkable. IMPRESSION: Moderate degenerative joint disease is noted medially. No acute abnormality seen in the right knee. Electronically Signed   By: Marijo Conception, M.D.   On: 02/09/2018 15:24   Dg C-arm 1-60 Min  Result Date: 02/10/2018 CLINICAL DATA:  ORIF EXAM: DG C-ARM 61-120 MIN; OPERATIVE LEFT HIP WITH PELVIS COMPARISON:  02/09/2018. FINDINGS: ORIF left hip.  Hardware intact.  Anatomic alignment. IMPRESSION: ORIF left hip with anatomic alignment. Electronically Signed   By: Marcello Moores  Register   On: 02/10/2018 13:57   Dg Hip Operative Unilat W Or W/o Pelvis Left  Result Date: 02/10/2018 CLINICAL DATA:  ORIF EXAM: DG C-ARM 61-120 MIN; OPERATIVE LEFT HIP WITH PELVIS COMPARISON:  02/09/2018. FINDINGS: ORIF left hip.  Hardware intact.  Anatomic alignment. IMPRESSION: ORIF left hip with anatomic alignment. Electronically Signed   By: Marcello Moores  Register   On: 02/10/2018 13:57   Dg Hip Unilat W Or Wo Pelvis 2-3 Views Left  Result Date: 02/09/2018 CLINICAL DATA:  Left hip pain after fall today. EXAM: DG HIP (WITH OR WITHOUT PELVIS) 2-3V LEFT COMPARISON:  None. FINDINGS: Moderately displaced and comminuted fracture is  seen involving the intertrochanteric region of proximal left femur. Right hip joint appears normal. IMPRESSION: Moderately displaced and comminuted intertrochanteric proximal left femoral fracture. Electronically Signed   By: Marijo Conception, M.D.   On: 02/09/2018 15:20   Dg Femur Port Min 2 Views Left  Result Date: 02/09/2018 CLINICAL DATA:  Fall today with left flank pain. Intertrochanteric fracture on hip radiograph. EXAM: LEFT FEMUR PORTABLE 2 VIEWS COMPARISON:  Pelvis and left hip radiograph earlier this day. FINDINGS: Comminuted displaced intertrochanteric proximal femur fracture, better assessed on prior pelvis and hip exam. The distal femur is intact. No additional fracture. Total knee arthroplasty without apparent complication. No knee  joint effusion. There are vascular calcifications. IMPRESSION: Comminuted displaced intertrochanteric femur fracture. Distal femur is intact. Electronically Signed   By: Keith Rake M.D.   On: 02/09/2018 19:05       Subjective: Patient seen and examined at bedside this morning.  Remains comfortable.  Hemodynamically stable.  Mental status is improved.  Discharge Exam: Vitals:   02/12/18 2104 02/13/18 0421  BP: (!) 125/52 (!) 131/47  Pulse: 79 62  Resp: 14   Temp: 99.7 F (37.6 C) 97.7 F (36.5 C)  SpO2: 93% 96%   Vitals:   02/12/18 1702 02/12/18 1927 02/12/18 2104 02/13/18 0421  BP: (!) 128/49 (!) 123/51 (!) 125/52 (!) 131/47  Pulse: 80 77 79 62  Resp: 12  14   Temp: 98.9 F (37.2 C) 99 F (37.2 C) 99.7 F (37.6 C) 97.7 F (36.5 C)  TempSrc: Axillary Oral Oral Oral  SpO2: 96% 90% 93% 96%  Weight:      Height:        General: Pt is alert, awake, not in acute distress Cardiovascular: RRR, S1/S2 +, no rubs, no gallops Respiratory: CTA bilaterally, no wheezing, no rhonchi Abdominal: Soft, NT, ND, bowel sounds + Extremities: no edema, no cyanosis    The results of significant diagnostics from this hospitalization (including imaging, microbiology, ancillary and laboratory) are listed below for reference.     Microbiology: Recent Results (from the past 240 hour(s))  Surgical pcr screen     Status: None   Collection Time: 02/10/18  1:00 AM  Result Value Ref Range Status   MRSA, PCR NEGATIVE NEGATIVE Final   Staphylococcus aureus NEGATIVE NEGATIVE Final    Comment: (NOTE) The Xpert SA Assay (FDA approved for NASAL specimens in patients 78 years of age and older), is one component of a comprehensive surveillance program. It is not intended to diagnose infection nor to guide or monitor treatment. Performed at [facility], [place] [address], [place],  [address]      Labs: BNP (last 3 results) No results for input(s): BNP in the last 8760  hours. Basic Metabolic Panel: Recent Labs  Lab 02/09/18 1544 02/11/18 0239 02/12/18 0534  NA 141 135 135  K 4.1 4.3 4.0  CL 108 105 105  CO2  --  22 22  GLUCOSE 140* 143* 139*  BUN 20 20 26*  CREATININE 1.10* 1.09* 1.19*  CALCIUM  --  8.0* 8.1*   Liver Function Tests: No results for input(s): AST, ALT, ALKPHOS, BILITOT, PROT, ALBUMIN in the last 168 hours. No results for input(s): LIPASE, AMYLASE in the last 168 hours. No results for input(s): AMMONIA in the last 168 hours. CBC: Recent Labs  Lab 02/09/18 1530 02/09/18 1544 02/11/18 0239 02/12/18 0534 02/13/18 0546  WBC 4.9  --  7.4 5.7 5.0  NEUTROABS 4.0  --  5.9  --  3.7  HGB 9.8* 10.2* 7.9* 7.1* 7.9*  HCT 30.8* 30.0* 25.7* 22.3* 24.5*  MCV 99.7  --  102.0* 99.1 96.8  PLT 123*  --  107* 106* 117*   Cardiac Enzymes: No results for input(s): CKTOTAL, CKMB, CKMBINDEX, TROPONINI in the last 168 hours. BNP: Invalid input(s): POCBNP CBG: No results for input(s): GLUCAP in the last 168 hours. D-Dimer No results for input(s): DDIMER in the last 72 hours. Hgb A1c No results for input(s): HGBA1C in the last 72 hours. Lipid Profile No results for input(s): CHOL, HDL, LDLCALC, TRIG, CHOLHDL, LDLDIRECT in the last 72 hours. Thyroid function studies No results for input(s): TSH, T4TOTAL, T3FREE, THYROIDAB in the last 72 hours.  Invalid input(s): FREET3 Anemia work up No results for input(s): VITAMINB12, FOLATE, FERRITIN, TIBC, IRON, RETICCTPCT in the last 72 hours. Urinalysis    Component Value Date/Time   COLORURINE YELLOW 02/10/2018 1915   APPEARANCEUR CLEAR 02/10/2018 1915   LABSPEC 1.028 02/10/2018 1915   PHURINE 5.0 02/10/2018 1915   GLUCOSEU NEGATIVE 02/10/2018 1915   HGBUR NEGATIVE 02/10/2018 1915   BILIRUBINUR NEGATIVE 02/10/2018 1915   KETONESUR NEGATIVE 02/10/2018 1915   PROTEINUR NEGATIVE 02/10/2018 1915   UROBILINOGEN 0.2 07/20/2013 1457   NITRITE NEGATIVE 02/10/2018 1915   LEUKOCYTESUR NEGATIVE  02/10/2018 1915   Sepsis Labs Invalid input(s): PROCALCITONIN,  WBC,  LACTICIDVEN Microbiology Recent Results (from the past 240 hour(s))  Surgical pcr screen     Status: None   Collection Time: 02/10/18  1:00 AM  Result Value Ref Range Status   MRSA, PCR NEGATIVE NEGATIVE Final   Staphylococcus aureus NEGATIVE NEGATIVE Final    Comment: (NOTE) The Xpert SA Assay (FDA approved for NASAL specimens in patients 59 years of age and older), is one component of a comprehensive surveillance program. It is not intended to diagnose infection nor to guide or monitor treatment. Performed at [facility], [place] [address] [place], [place] [address]     Please note: You were cared for by a hospitalist during your hospital stay. Once you are discharged, your primary care physician will handle any further medical issues. Please note that NO REFILLS for any discharge medications will be authorized once you are discharged, as it is imperative that you return to your primary care physician (or establish a relationship with a primary care physician if you do not have one) for your post hospital discharge needs so that they can reassess your need for medications and monitor your lab values.    Time coordinating discharge: 40 minutes  SIGNED:   Shelly Coss, MD  [facility] 02/13/2018, 10:47 AM Pager [phone]  If 7PM-7AM, please contact night-coverage [email or web address] Password [email or web address]

## 2018-02-13 NOTE — Progress Notes (Signed)
CSW received call back from patient daughter [name] who reports preference of SNF is [facility].   Referral sent to [facility], liaison follow up to see if patient has [place] out of network benefits for SNF, as [facility] does not have [place] representative.   CSW will continue to follow up.   [place], [place]

## 2018-02-13 NOTE — Clinical Social Work Note (Signed)
Clinical Social Work Assessment  Patient Details  Name: [name] MRN: [id number] Date of Birth: Jan 22, 1928  Date of referral:  02/13/18               Reason for consult:  Discharge Planning                Permission sought to share information with:  Case Manager, Facility Sport and exercise psychologist, Family Supports Permission granted to share information::  Yes, Verbal Permission Granted  Name::     [facility]  Agency::  SNFs  Relationship::  daughter  Contact Information:  [phone]  Housing/Transportation Living arrangements for the past 2 months:  Single Family Home Source of Information:  Adult Children Patient Interpreter Needed:  None Criminal Activity/Legal Involvement Pertinent to Current Situation/Hospitalization:  No - Comment as needed Significant Relationships:  Adult Children Lives with:  Self Do you feel safe going back to the place where you live?  No Need for family participation in patient care:  Yes (Comment)  Care giving concerns:  CSW received referral for possible SNF placement at time of discharge. Spoke with patient's daughter [name] regarding possibility of SNF placement . Patient's family   is currently unable to care for her at their home given patient's current needs and fall risk.  Patient's daughter [name] expressed understanding of PT recommendation and are agreeable to SNF placement at time of discharge. CSW to continue to follow and assist with discharge planning needs.     Social Worker assessment / plan:  Spoke with patient's daughter [name]  concerning possibility of rehab at [facility] before returning home.    Employment status:  Retired Nurse, adult PT Recommendations:  [place] / Referral to community resources:  [place]  Patient/Family's Response to care:  Patient's daughter [name] recognizes need for rehab before returning home and are agreeable to a SNF in [place]. They  report preference for  [facility] as patient's granddaughter attended [facility] over the summer and they report excellent PT there . CSW explained insurance authorization process. Patient's family reported that they want patient to get stronger to be able to come back home.    Patient/Family's Understanding of and Emotional Response to Diagnosis, Current Treatment, and Prognosis:  Patient/family is realistic regarding therapy needs and expressed being hopeful for SNF  placement. Patient expressed understanding of CSW role and discharge process as well as medical condition. No questions/concerns about plan or treatment.    Emotional Assessment Appearance:  Appears stated age Attitude/Demeanor/Rapport:  Unable to Assess Affect (typically observed):  Unable to Assess Orientation:  Oriented to Self, Oriented to Place, Oriented to Situation Alcohol / Substance use:  Not Applicable Psych involvement (Current and /or in the community):  No (Comment)  Discharge Needs  Concerns to be addressed:  Discharge Planning Concerns Readmission within the last 30 days:    Current discharge risk:  Dependent with Mobility Barriers to Discharge:  Continued Medical Work up   [employer], LCSW 02/13/2018, 10:08 AM

## 2018-02-14 NOTE — Progress Notes (Signed)
Occupational Therapy Treatment Patient Details Name: [name] MRN: [id number] DOB: 1927/02/15 Today's Date: 02/14/2018    History of present illness Pt is an 85 y.o. female with medical history significant of asthma, chronic diastolic congestive heart failure, CKD stage III, depression, hypertension, GERD, hyperlipidemia, and paroxysmal A. fib.  She presented to the ED after a fall at home.  Xray revealed L hip fx. She underwent IM nailing 02/10/18.    OT comments  Pt progressing towards established OT goals. Pt continues to present with decreased strength, balance, and activity tolerance. Pt performing stand pivot transfer with Max A +2 and RW and presenting with posterior lean. Pt performing grooming and UB bathing while seated in recliner in set up and supervision. Continue to recommend dc to SNF and will continue to follow acutely.    Follow Up Recommendations  SNF;Supervision/Assistance - 24 hour    Equipment Recommendations  None recommended by OT    Recommendations for Other Services PT consult    Precautions / Restrictions Precautions Precautions: Fall Restrictions Weight Bearing Restrictions: Yes LLE Weight Bearing: Weight bearing as tolerated       Mobility Bed Mobility Overal bed mobility: Needs Assistance Bed Mobility: Supine to Sit     Supine to sit: Mod assist;HOB elevated     General bed mobility comments: assist to bring L LE and hips to EOB and then to elevate trunk into sitting; cues for sequencing and use of rail  Transfers Overall transfer level: Needs assistance Equipment used: Rolling walker (2 wheeled) Transfers: Sit to/from [facility] Sit to Stand: Min assist;+2 physical assistance Stand pivot transfers: +2 physical assistance;Max assist       General transfer comment: cues for safe hand placement; assist to power up into standing and to gain balance upon stand; increased assist for balance, weight shifting, and guiding RW  for pivot to recliner; pt with posterior bias    Balance Overall balance assessment: Needs assistance Sitting-balance support: Feet supported;Single extremity supported Sitting balance-Leahy Scale: Fair     Standing balance support: Bilateral upper extremity supported;During functional activity Standing balance-Leahy Scale: Poor                             ADL either performed or assessed with clinical judgement   ADL Overall ADL's : Needs assistance/impaired     Grooming: Wash/dry face;Set up;Supervision/safety;Sitting   Upper Body Bathing: Set up;Supervision/ safety;Sitting Upper Body Bathing Details (indicate cue type and reason): Pt washing axillary area with supervision while seated in recliner     Upper Body Dressing : Minimal assistance;Sitting Upper Body Dressing Details (indicate cue type and reason): donning new gown Lower Body Dressing: Maximal assistance Lower Body Dressing Details (indicate cue type and reason): Max A to adjust socks Toilet Transfer: Maximal assistance;+2 for physical assistance;Stand-pivot;RW Toilet Transfer Details (indicate cue type and reason): Max A +2 for stand pivot to recliner. Pt presenting with posterior lean with dynamic balance         Functional mobility during ADLs: Maximal assistance;+2 for safety/equipment;+2 for physical assistance;Rolling walker General ADL Comments: Pt demonstrating increased activity tolerance this session. Continues to present with decreased balance and significant pain thorugh LLE     Vision       Perception     Praxis      Cognition Arousal/Alertness: Awake/alert Behavior During Therapy: WFL for tasks assessed/performed;Flat affect Overall Cognitive Status: Within Functional Limits for tasks assessed  General Comments: Continues to present with some decreased procressing but follow cues for ADLs and transfers        Exercises      Shoulder Instructions       General Comments Noted small red spot on back. RN notified and placed bandage.    Pertinent Vitals/ Pain       Pain Assessment: Faces Faces Pain Scale: Hurts even more Pain Location: L hip during mobility/WB Pain Descriptors / Indicators: Guarding;Sore Pain Intervention(s): Monitored during session;Limited activity within patient's tolerance;Repositioned  Home Living                                          Prior Functioning/Environment              Frequency  Min 2X/week        Progress Toward Goals  OT Goals(current goals can now be found in the care plan section)  Progress towards OT goals: Progressing toward goals  Acute Rehab OT Goals Patient Stated Goal: "Return to walking" OT Goal Formulation: With patient/family Time For Goal Achievement: 02/25/18 Potential to Achieve Goals: Good ADL Goals Pt Will Perform Upper Body Dressing: with set-up;with supervision;sitting Pt Will Perform Lower Body Dressing: with min assist;sit to/from stand( with or without AE) Pt Will Transfer to Toilet: with min assist;stand pivot transfer;bedside commode Pt Will Perform Toileting - Clothing Manipulation and hygiene: with min assist;sit to/from stand Additional ADL Goal #1: Pt will perform bed mobility with supervision in preparation for ADLs  Plan Discharge plan remains appropriate    Co-evaluation    PT/OT/SLP Co-Evaluation/Treatment: Yes Reason for Co-Treatment: For patient/therapist safety;To address functional/ADL transfers PT goals addressed during session: Mobility/safety with mobility;Proper use of DME;Strengthening/ROM        AM-PAC OT "6 Clicks" Daily Activity     Outcome Measure   Help from another person eating meals?: None Help from another person taking care of personal grooming?: A Little Help from another person toileting, which includes using toliet, bedpan, or urinal?: A Lot Help from another person  bathing (including washing, rinsing, drying)?: A Lot Help from another person to put on and taking off regular upper body clothing?: A Little Help from another person to put on and taking off regular lower body clothing?: A Lot 6 Click Score: 16    End of Session Equipment Utilized During Treatment: Gait belt;Rolling walker  OT Visit Diagnosis: Unsteadiness on feet (R26.81);Other abnormalities of gait and mobility (R26.89);Muscle weakness (generalized) (M62.81);Other symptoms and signs involving cognitive function;Pain Pain - Right/Left: Left Pain - part of body: Leg   Activity Tolerance Patient tolerated treatment well   Patient Left in chair;with call bell/phone within reach;with chair alarm set   Nurse Communication Mobility status;Weight bearing status;Precautions        Time: 7416-3845 OT Time Calculation (min): 23 min  Charges: OT General Charges $OT Visit: 1 Visit OT Treatments $Self Care/Home Management : 8-22 mins  [place], OTR/L Acute Rehab Pager: [phone] Office: [place] 02/14/2018, 9:44 AM

## 2018-02-14 NOTE — Progress Notes (Signed)
CSW consulted with [place] regarding insurance auth through [place], it is reported possible auth achieved tomorrow for tomorrow dc on 02/15/2018.   CSW will continue to follow up.   [place], [place]

## 2018-02-14 NOTE — Plan of Care (Signed)
  Problem: Education: Goal: Knowledge of General Education information will improve Description: Including pain rating scale, medication(s)/side effects and non-pharmacologic comfort measures Outcome: Progressing   Problem: Activity: Goal: Risk for activity intolerance will decrease Outcome: Progressing   Problem: Pain Managment: Goal: General experience of comfort will improve Outcome: Progressing   

## 2018-02-14 NOTE — Progress Notes (Signed)
Physical Therapy Treatment Patient Details Name: [name] MRN: [id number] DOB: 16-Jan-1928 Today's Date: 02/14/2018    History of Present Illness Pt is an 85 y.o. female with medical history significant of asthma, chronic diastolic congestive heart failure, CKD stage III, depression, hypertension, GERD, hyperlipidemia, and paroxysmal A. fib.  She presented to the ED after a fall at home.  Xray revealed L hip fx. She underwent IM nailing 02/10/18.     PT Comments    Patient seen for mobility progression. Pt is pleasant and participatory. Pt requires +2 assist for functional transfers and unable to progress to gait training this session. Continue to progress as tolerated with anticipated d/c to SNF for further skilled PT services.     Follow Up Recommendations  SNF;Supervision/Assistance - 24 hour     Equipment Recommendations  Rolling walker with 5" wheels;3in1 (PT)    Recommendations for Other Services       Precautions / Restrictions Precautions Precautions: Fall Restrictions Weight Bearing Restrictions: Yes LLE Weight Bearing: Weight bearing as tolerated    Mobility  Bed Mobility Overal bed mobility: Needs Assistance Bed Mobility: Supine to Sit     Supine to sit: HOB elevated;Mod assist     General bed mobility comments: assist to bring L LE and hips to EOB and then to elevate trunk into sitting; cues for sequencing and use of rail  Transfers Overall transfer level: Needs assistance Equipment used: Rolling walker (2 wheeled) Transfers: Sit to/from [facility] Sit to Stand: Min assist;+2 physical assistance Stand pivot transfers: +2 physical assistance;Max assist       General transfer comment: cues for safe hand placement; assist to power up into standing and to gain balance upon stand; increased assist for balance, weight shifting, and guiding RW for pivot to recliner; pt with posterior bias  Ambulation/Gait             General Gait  Details: attempted taking steps; pt able to "inchworm" feet forward a few times but unable to progress to gait training given posterior bias and pt becoming fatigued   Stairs             Wheelchair Mobility    Modified Rankin (Stroke Patients Only)       Balance Overall balance assessment: Needs assistance Sitting-balance support: Feet supported;Single extremity supported Sitting balance-Leahy Scale: Fair     Standing balance support: Bilateral upper extremity supported;During functional activity Standing balance-Leahy Scale: Poor                              Cognition Arousal/Alertness: Awake/alert Behavior During Therapy: WFL for tasks assessed/performed;Flat affect Overall Cognitive Status: Within Functional Limits for tasks assessed                                        Exercises      General Comments        Pertinent Vitals/Pain Pain Assessment: Faces Faces Pain Scale: Hurts even more Pain Location: L hip during mobility/WB Pain Descriptors / Indicators: Guarding;Sore Pain Intervention(s): Limited activity within patient's tolerance;Monitored during session;Repositioned    Home Living                      Prior Function            PT Goals (current goals can now  be found in the care plan section) Acute Rehab PT Goals Patient Stated Goal: "Return to walking" Progress towards PT goals: Progressing toward goals    Frequency    Min 3X/week      PT Plan Current plan remains appropriate    Co-evaluation PT/OT/SLP Co-Evaluation/Treatment: Yes Reason for Co-Treatment: For patient/therapist safety;To address functional/ADL transfers PT goals addressed during session: Mobility/safety with mobility;Proper use of DME;Strengthening/ROM        AM-PAC PT "6 Clicks" Mobility   Outcome Measure  Help needed turning from your back to your side while in a flat bed without using bedrails?: A Lot Help needed moving  from lying on your back to sitting on the side of a flat bed without using bedrails?: A Little Help needed moving to and from a bed to a chair (including a wheelchair)?: A Lot Help needed standing up from a chair using your arms (e.g., wheelchair or bedside chair)?: A Lot Help needed to walk in hospital room?: Total Help needed climbing 3-5 steps with a railing? : Total 6 Click Score: 11    End of Session Equipment Utilized During Treatment: Gait belt Activity Tolerance: Patient tolerated treatment well Patient left: with call bell/phone within reach;in chair;with chair alarm set Nurse Communication: Mobility status PT Visit Diagnosis: Other abnormalities of gait and mobility (R26.89);Muscle weakness (generalized) (M62.81);Difficulty in walking, not elsewhere classified (R26.2);Pain Pain - Right/Left: Left Pain - part of body: Hip     Time: 3735-7897 PT Time Calculation (min) (ACUTE ONLY): 24 min  Charges:  $Therapeutic Activity: 8-22 mins                     Earney Navy, PTA Acute Rehabilitation Services Pager: [phone] Office: [phone]     Darliss Cheney 02/14/2018, 9:26 AM

## 2018-02-14 NOTE — Plan of Care (Signed)
  Problem: Pain Managment: Goal: General experience of comfort will improve Outcome: Progressing   Problem: Safety: Goal: Ability to remain free from injury will improve Outcome: Progressing   Problem: Skin Integrity: Goal: Risk for impaired skin integrity will decrease Outcome: Progressing   

## 2018-02-14 NOTE — Progress Notes (Signed)
PROGRESS NOTE    [name]  BOF:[id number] DOB: 1927/11/13 DOA: 02/09/2018 PCP: [name], MD   Brief Narrative: Patient is 85 year old female with past medical history of asthma, chronic diastolic congestive heart failure, CKD stage III, depression, hypertension, GERD, hyperlipidemia, paroxysmal A. fib who presented to the hospital after a mechanical fall at home.  Denies any dizziness, lightheadedness, chest pain or shortness of breath prior to fall.  Immediately developed excruciating pain on her left hip.  Imagings done on presentation showed moderately displaced and comminuted intertrochanteric proximal left femoral fracture.  Orthopedics consulted and she underwent Intramedullary fixation on 02/10/18.Plan to discharge her to skilled facility as soon as authorization process completes  Assessment & Plan:   Principal Problem:   Hip fracture ([place]) Active Problems:   Anemia, unspecified   Hypercholesteremia   GERD (gastroesophageal reflux disease)   Depression   Asthma   Gout   Chronic diastolic CHF (congestive heart failure), NYHA class 2 (HCC)   Essential hypertension   PAF (paroxysmal atrial fibrillation) (HCC)   Thrombocytopenia (HCC)   Closed comminuted intertrochanteric fracture of left femur (HCC)  Left hip intertrochanteric fracture: Secondary to mechanical fall. Underwent intramedullary nailing by orthopedics. PT/OT recommended skilled nursing facility. Social worker consulted. Continue pain management. Continue DVT prophylaxis with Lovenox.  Acute onChronic anemia: Hemoglobin dropped to 7.1. Most likely associated with surgery. Transfused her with 1 unitof PRBC  With Hb of 7.9.  Thrombocytopenia:Has history of chronic thrombocytopenia. Currently stable.  Asthma: Currently stable. Continue home inhalers. Continue Singulair.  Gout: Continue allopurinol  Hypertension: Currently on amlodipine.   GERD: Continue H2  blocker.  Hyperlipidemia: Continue statin  Depression:continue Zoloft  Proximal A. DPO:[id number] 5. S/p PPM.Followed by cardiology and felt not to be an ideal candidate for anticoagulation. Currently rate is controlled  Chronic diastolic congestive failure:No signs of volume overload at this time. Not On Diuretics.  Laceration of the left thumb: Can remove sutures in 3 to 5 days.  Continue dressing  Dementia/delirium/agitation:Mental status better this morning.Shehas history of delirium after surgical interventions. Developed  delirium secondary to hospitalization, surgical intervention.  Started on Seroquel. Make her room brightwith daylight. Encourage family involvement .      DVT prophylaxis: Lovenox Code Status: Full Family Communication: None present at the bedside Disposition Plan: Skilled nursing facility as soon as possible   Consultants: Orthopedics  Procedures: Intramedullary nailing  Antimicrobials: None  Subjective: Patient seen and examined bedside this morning.  Hemodynamically stable.    Pain looks well controlled.Comfortable  Objective: Vitals:   02/13/18 2120 02/13/18 2344 02/14/18 0547 02/14/18 0826  BP:  (!) 124/49 (!) 125/57   Pulse:  63 60   Resp:  16 13   Temp:  98.3 F (36.8 C) (!) 97.5 F (36.4 C)   TempSrc:  Oral Oral   SpO2: 97% 93% 98% 98%  Weight:      Height:        Intake/Output Summary (Last 24 hours) at 02/14/2018 1114 Last data filed at 02/14/2018 0900 Gross per 24 hour  Intake 240 ml  Output 1200 ml  Net -960 ml   Filed Weights   02/10/18 1048  Weight: 74 kg    Examination:  General exam: Calm, elderly lady  HEENT:PERRL,Oral mucosa moist, Ear/Nose normal on gross exam Respiratory system: Bilateral equal air entry, normal vesicular breath sounds, no wheezes or crackles  Cardiovascular system: S1 & S2 heard, RRR. No JVD, murmurs, rubs, gallops or clicks. No pedal edema. Gastrointestinal system: Abdomen  is  nondistended, soft and nontender. No organomegaly or masses felt. Normal bowel sounds heard. Central nervous system: Alert but not oriented. No focal neurological deficits. Extremities: No edema, no clubbing ,no cyanosis, distal peripheral pulses palpable.  Clean surgical wound on the left hip Left thumb wrapped with dressing. Skin: No rashes, lesions or ulcers,no icterus ,no pallor MSK: Normal muscle bulk,tone ,power     Data Reviewed: I have personally reviewed following labs and imaging studies  CBC: Recent Labs  Lab 02/09/18 1530 02/09/18 1544 02/11/18 0239 02/12/18 0534 02/13/18 0546  WBC 4.9  --  7.4 5.7 5.0  NEUTROABS 4.0  --  5.9  --  3.7  HGB 9.8* 10.2* 7.9* 7.1* 7.9*  HCT 30.8* 30.0* 25.7* 22.3* 24.5*  MCV 99.7  --  102.0* 99.1 96.8  PLT 123*  --  107* 106* 725*   Basic Metabolic Panel: Recent Labs  Lab 02/09/18 1544 02/11/18 0239 02/12/18 0534  NA 141 135 135  K 4.1 4.3 4.0  CL 108 105 105  CO2  --  22 22  GLUCOSE 140* 143* 139*  BUN 20 20 26*  CREATININE 1.10* 1.09* 1.19*  CALCIUM  --  8.0* 8.1*   GFR: Estimated Creatinine Clearance: 32.2 mL/min (A) (by C-G formula based on SCr of 1.19 mg/dL (H)). Liver Function Tests: No results for input(s): AST, ALT, ALKPHOS, BILITOT, PROT, ALBUMIN in the last 168 hours. No results for input(s): LIPASE, AMYLASE in the last 168 hours. No results for input(s): AMMONIA in the last 168 hours. Coagulation Profile: No results for input(s): INR, PROTIME in the last 168 hours. Cardiac Enzymes: No results for input(s): CKTOTAL, CKMB, CKMBINDEX, TROPONINI in the last 168 hours. BNP (last 3 results) Recent Labs    05/18/17 1329 07/06/17 1000  PROBNP 1,189* 1,456*   HbA1C: No results for input(s): HGBA1C in the last 72 hours. CBG: No results for input(s): GLUCAP in the last 168 hours. Lipid Profile: No results for input(s): CHOL, HDL, LDLCALC, TRIG, CHOLHDL, LDLDIRECT in the last 72 hours. Thyroid Function  Tests: No results for input(s): TSH, T4TOTAL, FREET4, T3FREE, THYROIDAB in the last 72 hours. Anemia Panel: No results for input(s): VITAMINB12, FOLATE, FERRITIN, TIBC, IRON, RETICCTPCT in the last 72 hours. Sepsis Labs: No results for input(s): PROCALCITON, LATICACIDVEN in the last 168 hours.  Recent Results (from the past 240 hour(s))  Surgical pcr screen     Status: None   Collection Time: 02/10/18  1:00 AM  Result Value Ref Range Status   MRSA, PCR NEGATIVE NEGATIVE Final   Staphylococcus aureus NEGATIVE NEGATIVE Final    Comment: (NOTE) The Xpert SA Assay (FDA approved for NASAL specimens in patients 46 years of age and older), is one component of a comprehensive surveillance program. It is not intended to diagnose infection nor to guide or monitor treatment. Performed at [facility], [place] [address] [place], [place] [address]          Radiology Studies: No results found.      Scheduled Meds: . allopurinol  100 mg Oral Daily  . amLODipine  2.5 mg Oral Daily  . cholecalciferol  1,000 Units Oral Daily  . docusate sodium  100 mg Oral BID  . enoxaparin (LOVENOX) injection  40 mg Subcutaneous Q24H  . famotidine  20 mg Oral BID  . ferrous sulfate  325 mg Oral Daily  . mometasone-formoterol  2 puff Inhalation BID  . montelukast  10 mg Oral QHS  . pravastatin  20 mg Oral QHS  . QUEtiapine  25 mg Oral QHS  . senna  1 tablet Oral BID  . sertraline  50 mg Oral Daily   Continuous Infusions:   LOS: 5 days    Time spent:25 mins. More than 50% of that time was spent in counseling and/or coordination of care.      Shelly Coss, MD [facility] Pager [phone]  If 7PM-7AM, please contact night-coverage [email or web address] Password [email or web address] 02/14/2018, 11:14 AM

## 2018-02-15 NOTE — Progress Notes (Signed)
PROGRESS NOTE    [name]  BOF:[id number] DOB: 05-08-1927 DOA: 02/09/2018 PCP: [name], MD   Brief Narrative: Patient is 85 year old female with past medical history of asthma, chronic diastolic congestive heart failure, CKD stage III, depression, hypertension, GERD, hyperlipidemia, paroxysmal A. fib who presented to the hospital after a mechanical fall at home.  Denies any dizziness, lightheadedness, chest pain or shortness of breath prior to fall.  Immediately developed excruciating pain on her left hip.  Imagings done on presentation showed moderately displaced and comminuted intertrochanteric proximal left femoral fracture.  Orthopedics consulted and she underwent Intramedullary fixation on 02/10/18.Plan to discharge her to skilled facility as soon as authorization process completes  Assessment & Plan:   Principal Problem:   Hip fracture ([place]) Active Problems:   Anemia, unspecified   Hypercholesteremia   GERD (gastroesophageal reflux disease)   Depression   Asthma   Gout   Chronic diastolic CHF (congestive heart failure), NYHA class 2 (HCC)   Essential hypertension   PAF (paroxysmal atrial fibrillation) (HCC)   Thrombocytopenia (HCC)   Closed comminuted intertrochanteric fracture of left femur (HCC)  Left hip intertrochanteric fracture: Secondary to mechanical fall. Underwent intramedullary nailing by orthopedics. PT/OT recommended skilled nursing facility. Social worker consulted. Continue pain management. Continue DVT prophylaxis with Lovenox.  Acute onChronic anemia: Hemoglobin dropped to 7.1. Most likely associated with surgery. Transfused her with 1 unitof PRBC  With last Hb of 7.9.  Thrombocytopenia:Has history of chronic thrombocytopenia. Currently stable.  Asthma: Currently stable. Continue home inhalers. Continue Singulair.  Gout: Continue allopurinol  Hypertension: Currently on amlodipine.   GERD: Continue H2  blocker.  Hyperlipidemia: Continue statin  Depression:continue Zoloft  Proximal A. DPO:[id number] 5. S/p PPM.Followed by cardiology and felt not to be an ideal candidate for anticoagulation. Currently rate is controlled  Chronic diastolic congestive failure:No signs of volume overload at this time. Not On Diuretics.  Laceration of the left thumb: Can remove sutures in 3 to 5 days.  Continue dressing  Dementia/delirium/agitation:Mental status better this morning.Shehas history of delirium after surgical interventions. Developed  delirium secondary to hospitalization, surgical intervention.  Started on Seroquel. Make her room brightwith daylight. Encourage family involvement .      DVT prophylaxis: Lovenox Code Status: Full Family Communication: None present at the bedside Disposition Plan: Skilled nursing facility as soon as possible   Consultants: Orthopedics  Procedures: Intramedullary nailing  Antimicrobials: None  Subjective: Patient seen and examined bedside this morning.  No changes from yesterday.  She is comfortable.  Awaiting bed at skilled nursing facility.  Objective: Vitals:   02/14/18 1443 02/14/18 1928 02/15/18 0145 02/15/18 0533  BP: (!) 132/48 (!) 127/50  (!) 126/56  Pulse: 61 62  60  Resp: 16 14  14   Temp: 98.2 F (36.8 C) 97.9 F (36.6 C)  98.1 F (36.7 C)  TempSrc: Oral Oral  Oral  SpO2: 93% 95% 96% 94%  Weight:      Height:        Intake/Output Summary (Last 24 hours) at 02/15/2018 1134 Last data filed at 02/15/2018 0900 Gross per 24 hour  Intake 600 ml  Output 1150 ml  Net -550 ml   Filed Weights   02/10/18 1048  Weight: 74 kg    Examination:  General exam: Calm, elderly lady  HEENT:PERRL,Oral mucosa moist, Ear/Nose normal on gross exam Respiratory system: Bilateral equal air entry, normal vesicular breath sounds, no wheezes or crackles  Cardiovascular system: S1 & S2 heard, RRR. No JVD, murmurs,  rubs, gallops or  clicks. No pedal edema. Gastrointestinal system: Abdomen is nondistended, soft and nontender. No organomegaly or masses felt. Normal bowel sounds heard. Central nervous system: Alert but not oriented. No focal neurological deficits. Extremities: No edema, no clubbing ,no cyanosis, distal peripheral pulses palpable.  Clean surgical wound on the left hip Left thumb wrapped with dressing. Skin: No rashes, lesions or ulcers,no icterus ,no pallor MSK: Normal muscle bulk,tone ,power     Data Reviewed: I have personally reviewed following labs and imaging studies  CBC: Recent Labs  Lab 02/09/18 1530 02/09/18 1544 02/11/18 0239 02/12/18 0534 02/13/18 0546  WBC 4.9  --  7.4 5.7 5.0  NEUTROABS 4.0  --  5.9  --  3.7  HGB 9.8* 10.2* 7.9* 7.1* 7.9*  HCT 30.8* 30.0* 25.7* 22.3* 24.5*  MCV 99.7  --  102.0* 99.1 96.8  PLT 123*  --  107* 106* 448*   Basic Metabolic Panel: Recent Labs  Lab 02/09/18 1544 02/11/18 0239 02/12/18 0534  NA 141 135 135  K 4.1 4.3 4.0  CL 108 105 105  CO2  --  22 22  GLUCOSE 140* 143* 139*  BUN 20 20 26*  CREATININE 1.10* 1.09* 1.19*  CALCIUM  --  8.0* 8.1*   GFR: Estimated Creatinine Clearance: 32.2 mL/min (A) (by C-G formula based on SCr of 1.19 mg/dL (H)). Liver Function Tests: No results for input(s): AST, ALT, ALKPHOS, BILITOT, PROT, ALBUMIN in the last 168 hours. No results for input(s): LIPASE, AMYLASE in the last 168 hours. No results for input(s): AMMONIA in the last 168 hours. Coagulation Profile: No results for input(s): INR, PROTIME in the last 168 hours. Cardiac Enzymes: No results for input(s): CKTOTAL, CKMB, CKMBINDEX, TROPONINI in the last 168 hours. BNP (last 3 results) Recent Labs    05/18/17 1329 07/06/17 1000  PROBNP 1,189* 1,456*   HbA1C: No results for input(s): HGBA1C in the last 72 hours. CBG: No results for input(s): GLUCAP in the last 168 hours. Lipid Profile: No results for input(s): CHOL, HDL, LDLCALC, TRIG,  CHOLHDL, LDLDIRECT in the last 72 hours. Thyroid Function Tests: No results for input(s): TSH, T4TOTAL, FREET4, T3FREE, THYROIDAB in the last 72 hours. Anemia Panel: No results for input(s): VITAMINB12, FOLATE, FERRITIN, TIBC, IRON, RETICCTPCT in the last 72 hours. Sepsis Labs: No results for input(s): PROCALCITON, LATICACIDVEN in the last 168 hours.  Recent Results (from the past 240 hour(s))  Surgical pcr screen     Status: None   Collection Time: 02/10/18  1:00 AM  Result Value Ref Range Status   MRSA, PCR NEGATIVE NEGATIVE Final   Staphylococcus aureus NEGATIVE NEGATIVE Final    Comment: (NOTE) The Xpert SA Assay (FDA approved for NASAL specimens in patients 20 years of age and older), is one component of a comprehensive surveillance program. It is not intended to diagnose infection nor to guide or monitor treatment. Performed at [facility], [place] [address], [place], [place] [address]          Radiology Studies: No results found.      Scheduled Meds: . allopurinol  100 mg Oral Daily  . amLODipine  2.5 mg Oral Daily  . cholecalciferol  1,000 Units Oral Daily  . docusate sodium  100 mg Oral BID  . enoxaparin (LOVENOX) injection  40 mg Subcutaneous Q24H  . famotidine  20 mg Oral BID  . ferrous sulfate  325 mg Oral Daily  . mometasone-formoterol  2 puff Inhalation BID  .  montelukast  10 mg Oral QHS  . pravastatin  20 mg Oral QHS  . QUEtiapine  25 mg Oral QHS  . senna  1 tablet Oral BID  . sertraline  50 mg Oral Daily   Continuous Infusions:   LOS: 6 days    Time spent:25 mins. More than 50% of that time was spent in counseling and/or coordination of care.      Shelly Coss, MD [facility] Pager [phone]  If 7PM-7AM, please contact night-coverage [email or web address] Password [email or web address] 02/15/2018, 11:34 AM

## 2018-02-16 DIAGNOSIS — I480 Paroxysmal atrial fibrillation: Secondary | ICD-10-CM | POA: Diagnosis not present

## 2018-02-16 DIAGNOSIS — D6489 Other specified anemias: Secondary | ICD-10-CM | POA: Diagnosis not present

## 2018-02-16 DIAGNOSIS — M6281 Muscle weakness (generalized): Secondary | ICD-10-CM | POA: Diagnosis not present

## 2018-02-16 DIAGNOSIS — S7292XD Unspecified fracture of left femur, subsequent encounter for closed fracture with routine healing: Secondary | ICD-10-CM | POA: Diagnosis not present

## 2018-02-16 DIAGNOSIS — E790 Hyperuricemia without signs of inflammatory arthritis and tophaceous disease: Secondary | ICD-10-CM | POA: Diagnosis not present

## 2018-02-16 DIAGNOSIS — R5381 Other malaise: Secondary | ICD-10-CM | POA: Diagnosis not present

## 2018-02-16 DIAGNOSIS — S72002A Fracture of unspecified part of neck of left femur, initial encounter for closed fracture: Secondary | ICD-10-CM | POA: Diagnosis not present

## 2018-02-16 DIAGNOSIS — R2681 Unsteadiness on feet: Secondary | ICD-10-CM | POA: Diagnosis not present

## 2018-02-16 DIAGNOSIS — J45909 Unspecified asthma, uncomplicated: Secondary | ICD-10-CM | POA: Diagnosis not present

## 2018-02-16 DIAGNOSIS — W1809XA Striking against other object with subsequent fall, initial encounter: Secondary | ICD-10-CM | POA: Diagnosis not present

## 2018-02-16 DIAGNOSIS — Z7401 Bed confinement status: Secondary | ICD-10-CM | POA: Diagnosis not present

## 2018-02-16 DIAGNOSIS — Z23 Encounter for immunization: Secondary | ICD-10-CM | POA: Diagnosis present

## 2018-02-16 DIAGNOSIS — S7292XA Unspecified fracture of left femur, initial encounter for closed fracture: Secondary | ICD-10-CM | POA: Diagnosis not present

## 2018-02-16 DIAGNOSIS — R278 Other lack of coordination: Secondary | ICD-10-CM | POA: Diagnosis not present

## 2018-02-16 DIAGNOSIS — M2550 Pain in unspecified joint: Secondary | ICD-10-CM | POA: Diagnosis not present

## 2018-02-16 DIAGNOSIS — R41841 Cognitive communication deficit: Secondary | ICD-10-CM | POA: Diagnosis not present

## 2018-02-16 DIAGNOSIS — Y92000 Kitchen of unspecified non-institutional (private) residence as  the place of occurrence of the external cause: Secondary | ICD-10-CM | POA: Diagnosis not present

## 2018-02-16 DIAGNOSIS — Z9889 Other specified postprocedural states: Secondary | ICD-10-CM | POA: Diagnosis not present

## 2018-02-16 DIAGNOSIS — R2689 Other abnormalities of gait and mobility: Secondary | ICD-10-CM | POA: Diagnosis not present

## 2018-02-16 DIAGNOSIS — I10 Essential (primary) hypertension: Secondary | ICD-10-CM | POA: Diagnosis not present

## 2018-02-16 DIAGNOSIS — Z4789 Encounter for other orthopedic aftercare: Secondary | ICD-10-CM | POA: Diagnosis not present

## 2018-02-16 LAB — HEMOGLOBIN AND HEMATOCRIT, BLOOD
HEMATOCRIT: 24.4 % — AB (ref 36.0–46.0)
Hemoglobin: 7.9 g/dL — ABNORMAL LOW (ref 12.0–15.0)

## 2018-02-16 NOTE — Progress Notes (Signed)
Patient will DC to: [facility] Anticipated DC date: 02/16/2018 Family notified:Mary Transport DO:[id number]  Per MD patient ready for DC to [facility] . RN, patient, patient's family, and facility notified of DC. Discharge Summary sent to facility. RN given number for report [phone] Room [address]. DC packet on chart. Ambulance transport requested for patient.  CSW signing off.  [place], [place]

## 2018-02-16 NOTE — Plan of Care (Signed)

## 2018-02-16 NOTE — Clinical Social Work Placement (Signed)
   CLINICAL SOCIAL WORK PLACEMENT  NOTE  Date:  02/16/2018  Patient Details  Name: [name] MRN: [id number] Date of Birth: 04-08-1927  Clinical Social Work is seeking post-discharge placement for this patient at the [place] level of care (*CSW will initial, date and re-position this form in  chart as items are completed):      Patient/family provided with [facility] Department's list of facilities offering this level of care within the geographic area requested by the patient (or if unable, by the patient's family).  Yes   Patient/family informed of their freedom to choose among providers that offer the needed level of care, that participate in Medicare, Medicaid or managed care program needed by the patient, have an available bed and are willing to accept the patient.      Patient/family informed of [facility]'s ownership interest in [facility] and [facility], as well as of the fact that they are under no obligation to receive care at these facilities.  PASRR submitted to EDS on       PASRR number received on 02/11/18     Existing PASRR number confirmed on       FL2 transmitted to all facilities in geographic area requested by pt/family on 02/11/18     FL2 transmitted to all facilities within larger geographic area on 02/11/18     Patient informed that his/her managed care company has contracts with or will negotiate with certain facilities, including the following:        Yes   Patient/family informed of bed offers received.  Patient chooses bed at [facility]     Physician recommends and patient chooses bed at      Patient to be transferred to [facility] on 02/16/18.  Patient to be transferred to facility by [facility]     Patient family notified on 02/16/18 of transfer.  Name of family member notified:  [name] (daughter)     PHYSICIAN       Additional Comment:     _______________________________________________ Alberteen Sam, LCSW 02/16/2018, 11:24 AM

## 2018-02-16 NOTE — Progress Notes (Signed)
PROGRESS NOTE    [name]  WUJ:[id number] DOB: 1927-12-23 DOA: 02/09/2018 PCP: [name], MD   Brief Narrative: Patient is 85 year old female with past medical history of asthma, chronic diastolic congestive heart failure, CKD stage III, depression, hypertension, GERD, hyperlipidemia, paroxysmal A. fib who presented to the hospital after a mechanical fall at home.  Denies any dizziness, lightheadedness, chest pain or shortness of breath prior to fall.  Immediately developed excruciating pain on her left hip.  Imagings done on presentation showed moderately displaced and comminuted intertrochanteric proximal left femoral fracture.  Orthopedics consulted and she underwent Intramedullary fixation on 02/10/18.Planis to discharge her today.  Assessment & Plan:   Principal Problem:   Hip fracture ([place]) Active Problems:   Anemia, unspecified   Hypercholesteremia   GERD (gastroesophageal reflux disease)   Depression   Asthma   Gout   Chronic diastolic CHF (congestive heart failure), NYHA class 2 (HCC)   Essential hypertension   PAF (paroxysmal atrial fibrillation) (HCC)   Thrombocytopenia (HCC)   Closed comminuted intertrochanteric fracture of left femur (HCC)  Left hip intertrochanteric fracture: Secondary to mechanical fall. Underwent intramedullary nailing by orthopedics. PT/OT recommended skilled nursing facility. Social worker consulted. Continue pain management. Continue DVT prophylaxis with Lovenox.  Acute onChronic anemia: Hemoglobin dropped to 7.1. Most likely associated with surgery. Transfused her with 1 unitof PRBC  With last Hb of 7.9.  Thrombocytopenia:Has history of chronic thrombocytopenia. Currently stable.  Asthma: Currently stable. Continue home inhalers. Continue Singulair.  Gout: Continue allopurinol  Hypertension: Currently on amlodipine.   GERD: Continue H2 blocker.  Hyperlipidemia: Continue statin  Depression:continue  Zoloft  Proximal A. NFA:[id number] 5. S/p PPM.Followed by cardiology and felt not to be an ideal candidate for anticoagulation. Currently rate is controlled  Chronic diastolic congestive failure:No signs of volume overload at this time. Not On Diuretics.  Laceration of the left thumb: Can remove sutures in 3 to 5 days.  Continue dressing  Dementia/delirium/agitation:Mental status better this morning.Shehas history of delirium after surgical interventions. Developed  delirium secondary to hospitalization, surgical intervention.  Started on Seroquel. Make her room brightwith daylight. Encourage family involvement .  Acute urinary retention: Will take foley out before discharge.      DVT prophylaxis: Lovenox Code Status: Full Family Communication: None present at the bedside Disposition Plan: Skilled nursing facility today  Consultants: Orthopedics  Procedures: Intramedullary nailing  Antimicrobials: None  Subjective: Patient seen and examined bedside this morning.  No changes from yesterday.  She is comfortable.  Awaiting bed at skilled nursing facility. Will take the foley out today.  Objective: Vitals:   02/15/18 1500 02/15/18 2017 02/15/18 2037 02/16/18 0504  BP: 131/72 (!) 126/48  110/68  Pulse: 62 (!) 59 60 60  Resp: 16  20   Temp: 98.2 F (36.8 C) 98.9 F (37.2 C)  98.3 F (36.8 C)  TempSrc: Oral Oral  Oral  SpO2: 99% 96% 98% 97%  Weight:      Height:       No intake or output data in the 24 hours ending 02/16/18 1117 Filed Weights   02/10/18 1048  Weight: 74 kg    Examination:  General exam: Calm, elderly lady  HEENT:PERRL,Oral mucosa moist, Ear/Nose normal on gross exam Respiratory system: Bilateral equal air entry, normal vesicular breath sounds, no wheezes or crackles  Cardiovascular system: S1 & S2 heard, RRR. No JVD, murmurs, rubs, gallops or clicks. No pedal edema. Gastrointestinal system: Abdomen is nondistended, soft and nontender.  No organomegaly  or masses felt. Normal bowel sounds heard. Central nervous system: Alert but not oriented. No focal neurological deficits. Extremities: No edema, no clubbing ,no cyanosis, distal peripheral pulses palpable.  Clean surgical wound on the left hip Left thumb wrapped with dressing. Skin: No rashes, lesions or ulcers,no icterus ,no pallor MSK: Normal muscle bulk,tone ,power     Data Reviewed: I have personally reviewed following labs and imaging studies  CBC: Recent Labs  Lab 02/09/18 1530 02/09/18 1544 02/11/18 0239 02/12/18 0534 02/13/18 0546 02/16/18 0731  WBC 4.9  --  7.4 5.7 5.0  --   NEUTROABS 4.0  --  5.9  --  3.7  --   HGB 9.8* 10.2* 7.9* 7.1* 7.9* 7.9*  HCT 30.8* 30.0* 25.7* 22.3* 24.5* 24.4*  MCV 99.7  --  102.0* 99.1 96.8  --   PLT 123*  --  107* 106* 117*  --    Basic Metabolic Panel: Recent Labs  Lab 02/09/18 1544 02/11/18 0239 02/12/18 0534  NA 141 135 135  K 4.1 4.3 4.0  CL 108 105 105  CO2  --  22 22  GLUCOSE 140* 143* 139*  BUN 20 20 26*  CREATININE 1.10* 1.09* 1.19*  CALCIUM  --  8.0* 8.1*   GFR: Estimated Creatinine Clearance: 32.2 mL/min (A) (by C-G formula based on SCr of 1.19 mg/dL (H)). Liver Function Tests: No results for input(s): AST, ALT, ALKPHOS, BILITOT, PROT, ALBUMIN in the last 168 hours. No results for input(s): LIPASE, AMYLASE in the last 168 hours. No results for input(s): AMMONIA in the last 168 hours. Coagulation Profile: No results for input(s): INR, PROTIME in the last 168 hours. Cardiac Enzymes: No results for input(s): CKTOTAL, CKMB, CKMBINDEX, TROPONINI in the last 168 hours. BNP (last 3 results) Recent Labs    05/18/17 1329 07/06/17 1000  PROBNP 1,189* 1,456*   HbA1C: No results for input(s): HGBA1C in the last 72 hours. CBG: No results for input(s): GLUCAP in the last 168 hours. Lipid Profile: No results for input(s): CHOL, HDL, LDLCALC, TRIG, CHOLHDL, LDLDIRECT in the last 72 hours. Thyroid  Function Tests: No results for input(s): TSH, T4TOTAL, FREET4, T3FREE, THYROIDAB in the last 72 hours. Anemia Panel: No results for input(s): VITAMINB12, FOLATE, FERRITIN, TIBC, IRON, RETICCTPCT in the last 72 hours. Sepsis Labs: No results for input(s): PROCALCITON, LATICACIDVEN in the last 168 hours.  Recent Results (from the past 240 hour(s))  Surgical pcr screen     Status: None   Collection Time: 02/10/18  1:00 AM  Result Value Ref Range Status   MRSA, PCR NEGATIVE NEGATIVE Final   Staphylococcus aureus NEGATIVE NEGATIVE Final    Comment: (NOTE) The Xpert SA Assay (FDA approved for NASAL specimens in patients 54 years of age and older), is one component of a comprehensive surveillance program. It is not intended to diagnose infection nor to guide or monitor treatment. Performed at [facility], [place] [address] [place], [place] [address]          Radiology Studies: No results found.      Scheduled Meds: . allopurinol  100 mg Oral Daily  . amLODipine  2.5 mg Oral Daily  . cholecalciferol  1,000 Units Oral Daily  . docusate sodium  100 mg Oral BID  . enoxaparin (LOVENOX) injection  40 mg Subcutaneous Q24H  . famotidine  20 mg Oral BID  . ferrous sulfate  325 mg Oral Daily  . mometasone-formoterol  2 puff Inhalation BID  . montelukast  10 mg Oral QHS  . pravastatin  20 mg Oral QHS  . QUEtiapine  25 mg Oral QHS  . senna  1 tablet Oral BID  . sertraline  50 mg Oral Daily   Continuous Infusions:   LOS: 7 days    Time spent:25 mins. More than 50% of that time was spent in counseling and/or coordination of care.      Shelly Coss, MD [facility] Pager [phone]  If 7PM-7AM, please contact night-coverage [email or web address] Password [email or web address] 02/16/2018, 11:17 AM

## 2018-02-16 NOTE — Progress Notes (Signed)
Called facility and gave report to [employer]. Pt not in distress, all questions and concerns addressed. Pt to discharge to facility with belongings via [place]. Awaiting for transport.

## 2018-02-16 NOTE — Plan of Care (Signed)
  Problem: Education: Goal: Knowledge of General Education information will improve Description Including pain rating scale, medication(s)/side effects and non-pharmacologic comfort measures Outcome: Progressing   Problem: Clinical Measurements: Goal: Will remain free from infection Outcome: Progressing    Problem: Elimination: Goal: Will not experience complications related to urinary retention Outcome: Progressing   Problem: Elimination: Goal: Will not experience complications related to bowel motility Outcome: Progressing   Problem: Pain Managment: Goal: General experience of comfort will improve Outcome: Progressing   Problem: Safety: Goal: Ability to remain free from injury will improve Outcome: Progressing   Problem: Skin Integrity: Goal: Risk for impaired skin integrity will decrease Outcome: Progressing

## 2018-02-16 NOTE — Progress Notes (Signed)
Physical Therapy Treatment Patient Details Name: [name] MRN: [id number] DOB: 1927-10-18 Today's Date: 02/16/2018    History of Present Illness Pt is an 85 y.o. female with medical history significant of asthma, chronic diastolic congestive heart failure, CKD stage III, depression, hypertension, GERD, hyperlipidemia, and paroxysmal A. fib.  She presented to the ED after a fall at home.  Xray revealed L hip fx. She underwent IM nailing 02/10/18.     PT Comments    Patient seen for mobility progression. Pt continues to make gradual progress toward PT goals. Pt able to ambulate 8 ft this session. Pt led through LE strengthening/ROM exercises. Continue to progress as tolerated with anticipated d/c to SNF for further skilled PT services.     Follow Up Recommendations  SNF;Supervision/Assistance - 24 hour     Equipment Recommendations  Rolling walker with 5" wheels;3in1 (PT)    Recommendations for Other Services       Precautions / Restrictions Precautions Precautions: Fall Restrictions Weight Bearing Restrictions: Yes LLE Weight Bearing: Weight bearing as tolerated    Mobility  Bed Mobility               General bed mobility comments: pt OOB in chair upon arrival  Transfers Overall transfer level: Needs assistance Equipment used: Rolling walker (2 wheeled) Transfers: Sit to/from Stand Sit to Stand: Mod assist;+2 safety/equipment         General transfer comment: cues for safe hand placement; assist to power up into standing and to gain balance upon standing; multimodal cues for upright posture  Ambulation/Gait Ambulation/Gait assistance: Mod assist;+2 safety/equipment(chair follow) Gait Distance (Feet): 8 Feet Assistive device: Rolling walker (2 wheeled) Gait Pattern/deviations: Step-to pattern;Shuffle;Trunk flexed;Decreased stance time - left;Decreased step length - right;Decreased weight shift to left;Antalgic     General Gait Details: cues for posture,  sequencing, and use of AD; assist for balance, weight shifting, and guiding RW   Stairs             Wheelchair Mobility    Modified Rankin (Stroke Patients Only)       Balance Overall balance assessment: Needs assistance Sitting-balance support: Feet supported;Single extremity supported Sitting balance-Leahy Scale: Fair     Standing balance support: Bilateral upper extremity supported;During functional activity Standing balance-Leahy Scale: Poor                              Cognition Arousal/Alertness: Awake/alert Behavior During Therapy: WFL for tasks assessed/performed;Flat affect Overall Cognitive Status: Within Functional Limits for tasks assessed                                        Exercises General Exercises - Lower Extremity Quad Sets: AROM;Strengthening;Both;10 reps Long Arc Quad: AROM;Both;10 reps;Seated Heel Slides: AAROM;Left;10 reps Hip ABduction/ADduction: AAROM;Left;10 reps Toe Raises: AROM;Both;Seated Heel Raises: AROM;Both;Seated    General Comments        Pertinent Vitals/Pain Pain Assessment: Faces Faces Pain Scale: Hurts even more Pain Location: L hip during mobility/WB Pain Descriptors / Indicators: Guarding;Sore;Grimacing Pain Intervention(s): Limited activity within patient's tolerance;Monitored during session;Repositioned;Patient requesting pain meds-RN notified;RN gave pain meds during session;Ice applied    Home Living                      Prior Function  PT Goals (current goals can now be found in the care plan section) Acute Rehab PT Goals Patient Stated Goal: "Return to walking" Progress towards PT goals: Progressing toward goals    Frequency    Min 3X/week      PT Plan Current plan remains appropriate    Co-evaluation              AM-PAC PT "6 Clicks" Mobility   Outcome Measure  Help needed turning from your back to your side while in a flat bed without  using bedrails?: A Lot Help needed moving from lying on your back to sitting on the side of a flat bed without using bedrails?: A Little Help needed moving to and from a bed to a chair (including a wheelchair)?: A Lot Help needed standing up from a chair using your arms (e.g., wheelchair or bedside chair)?: A Lot Help needed to walk in hospital room?: A Lot Help needed climbing 3-5 steps with a railing? : Total 6 Click Score: 12    End of Session Equipment Utilized During Treatment: Gait belt Activity Tolerance: Patient limited by pain Patient left: with call bell/phone within reach;in chair;with chair alarm set Nurse Communication: Mobility status PT Visit Diagnosis: Other abnormalities of gait and mobility (R26.89);Muscle weakness (generalized) (M62.81);Difficulty in walking, not elsewhere classified (R26.2);Pain Pain - Right/Left: Left Pain - part of body: Hip     Time: 1100-1131 PT Time Calculation (min) (ACUTE ONLY): 31 min  Charges:  $Gait Training: 8-22 mins $Therapeutic Exercise: 8-22 mins                     Earney Navy, PTA Acute Rehabilitation Services Pager: [phone] Office: [phone]     Darliss Cheney 02/16/2018, 12:18 PM

## 2018-02-17 DIAGNOSIS — D6489 Other specified anemias: Secondary | ICD-10-CM | POA: Diagnosis not present

## 2018-02-17 DIAGNOSIS — I10 Essential (primary) hypertension: Secondary | ICD-10-CM | POA: Diagnosis not present

## 2018-02-17 DIAGNOSIS — S7292XA Unspecified fracture of left femur, initial encounter for closed fracture: Secondary | ICD-10-CM | POA: Diagnosis not present

## 2018-02-17 DIAGNOSIS — E790 Hyperuricemia without signs of inflammatory arthritis and tophaceous disease: Secondary | ICD-10-CM | POA: Diagnosis not present

## 2018-02-22 DIAGNOSIS — D6489 Other specified anemias: Secondary | ICD-10-CM | POA: Diagnosis not present

## 2018-02-22 DIAGNOSIS — S7292XD Unspecified fracture of left femur, subsequent encounter for closed fracture with routine healing: Secondary | ICD-10-CM | POA: Diagnosis not present

## 2018-02-22 DIAGNOSIS — I480 Paroxysmal atrial fibrillation: Secondary | ICD-10-CM | POA: Diagnosis not present

## 2018-02-22 DIAGNOSIS — I10 Essential (primary) hypertension: Secondary | ICD-10-CM | POA: Diagnosis not present

## 2018-02-24 LAB — CUP PACEART REMOTE DEVICE CHECK
Battery Remaining Longevity: 102 mo
Battery Remaining Percentage: 100 %
Brady Statistic RA Percent Paced: 64 %
Brady Statistic RV Percent Paced: 0 %
Date Time Interrogation Session: 20191127082100
Implantable Lead Implant Date: 20180524
Implantable Lead Implant Date: 20180524
Implantable Lead Location: 753859
Implantable Lead Location: 753860
Implantable Lead Model: 7741
Implantable Lead Serial Number: 861246
Implantable Pulse Generator Implant Date: 20180524
Lead Channel Impedance Value: 567 Ohm
Lead Channel Impedance Value: 710 Ohm
Lead Channel Pacing Threshold Amplitude: 0.4 V
Lead Channel Pacing Threshold Amplitude: 0.9 V
Lead Channel Pacing Threshold Pulse Width: 0.4 ms
Lead Channel Setting Pacing Amplitude: 2 V
Lead Channel Setting Pacing Amplitude: 2.4 V
Lead Channel Setting Pacing Pulse Width: 0.4 ms
MDC IDC LEAD SERIAL: 693708
MDC IDC MSMT LEADCHNL RV PACING THRESHOLD PULSEWIDTH: 0.4 ms
MDC IDC SET LEADCHNL RV SENSING SENSITIVITY: 2 mV
Pulse Gen Serial Number: 308044

## 2018-03-09 DIAGNOSIS — D6489 Other specified anemias: Secondary | ICD-10-CM | POA: Diagnosis not present

## 2018-03-09 DIAGNOSIS — S7292XA Unspecified fracture of left femur, initial encounter for closed fracture: Secondary | ICD-10-CM | POA: Diagnosis not present

## 2018-03-09 DIAGNOSIS — Z9889 Other specified postprocedural states: Secondary | ICD-10-CM | POA: Diagnosis not present

## 2018-03-09 DIAGNOSIS — E790 Hyperuricemia without signs of inflammatory arthritis and tophaceous disease: Secondary | ICD-10-CM | POA: Diagnosis not present

## 2018-03-14 DIAGNOSIS — Z4789 Encounter for other orthopedic aftercare: Secondary | ICD-10-CM | POA: Diagnosis not present

## 2018-03-14 DIAGNOSIS — M6281 Muscle weakness (generalized): Secondary | ICD-10-CM | POA: Diagnosis not present

## 2018-03-14 DIAGNOSIS — J45909 Unspecified asthma, uncomplicated: Secondary | ICD-10-CM | POA: Diagnosis not present

## 2018-03-14 DIAGNOSIS — S72442S Displaced fracture of lower epiphysis (separation) of left femur, sequela: Secondary | ICD-10-CM | POA: Diagnosis not present

## 2018-03-17 DIAGNOSIS — I5032 Chronic diastolic (congestive) heart failure: Secondary | ICD-10-CM | POA: Diagnosis not present

## 2018-03-17 DIAGNOSIS — R2689 Other abnormalities of gait and mobility: Secondary | ICD-10-CM | POA: Diagnosis not present

## 2018-03-17 DIAGNOSIS — M6281 Muscle weakness (generalized): Secondary | ICD-10-CM | POA: Diagnosis not present

## 2018-03-17 DIAGNOSIS — M810 Age-related osteoporosis without current pathological fracture: Secondary | ICD-10-CM | POA: Diagnosis not present

## 2018-03-17 DIAGNOSIS — R269 Unspecified abnormalities of gait and mobility: Secondary | ICD-10-CM | POA: Diagnosis not present

## 2018-03-20 DIAGNOSIS — I5032 Chronic diastolic (congestive) heart failure: Secondary | ICD-10-CM | POA: Diagnosis not present

## 2018-03-20 DIAGNOSIS — M6281 Muscle weakness (generalized): Secondary | ICD-10-CM | POA: Diagnosis not present

## 2018-03-20 DIAGNOSIS — M810 Age-related osteoporosis without current pathological fracture: Secondary | ICD-10-CM | POA: Diagnosis not present

## 2018-03-20 DIAGNOSIS — R269 Unspecified abnormalities of gait and mobility: Secondary | ICD-10-CM | POA: Diagnosis not present

## 2018-03-22 DIAGNOSIS — M6281 Muscle weakness (generalized): Secondary | ICD-10-CM | POA: Diagnosis not present

## 2018-03-22 DIAGNOSIS — R269 Unspecified abnormalities of gait and mobility: Secondary | ICD-10-CM | POA: Diagnosis not present

## 2018-03-22 DIAGNOSIS — M810 Age-related osteoporosis without current pathological fracture: Secondary | ICD-10-CM | POA: Diagnosis not present

## 2018-03-22 DIAGNOSIS — I5032 Chronic diastolic (congestive) heart failure: Secondary | ICD-10-CM | POA: Diagnosis not present

## 2018-03-24 DIAGNOSIS — I5032 Chronic diastolic (congestive) heart failure: Secondary | ICD-10-CM | POA: Diagnosis not present

## 2018-03-24 DIAGNOSIS — R269 Unspecified abnormalities of gait and mobility: Secondary | ICD-10-CM | POA: Diagnosis not present

## 2018-03-24 DIAGNOSIS — M6281 Muscle weakness (generalized): Secondary | ICD-10-CM | POA: Diagnosis not present

## 2018-03-24 DIAGNOSIS — M810 Age-related osteoporosis without current pathological fracture: Secondary | ICD-10-CM | POA: Diagnosis not present

## 2018-03-29 DIAGNOSIS — M810 Age-related osteoporosis without current pathological fracture: Secondary | ICD-10-CM | POA: Diagnosis not present

## 2018-03-29 DIAGNOSIS — I5032 Chronic diastolic (congestive) heart failure: Secondary | ICD-10-CM | POA: Diagnosis not present

## 2018-03-29 DIAGNOSIS — R269 Unspecified abnormalities of gait and mobility: Secondary | ICD-10-CM | POA: Diagnosis not present

## 2018-03-29 DIAGNOSIS — M6281 Muscle weakness (generalized): Secondary | ICD-10-CM | POA: Diagnosis not present

## 2018-03-31 DIAGNOSIS — M6281 Muscle weakness (generalized): Secondary | ICD-10-CM | POA: Diagnosis not present

## 2018-03-31 DIAGNOSIS — I5032 Chronic diastolic (congestive) heart failure: Secondary | ICD-10-CM | POA: Diagnosis not present

## 2018-03-31 DIAGNOSIS — M810 Age-related osteoporosis without current pathological fracture: Secondary | ICD-10-CM | POA: Diagnosis not present

## 2018-03-31 DIAGNOSIS — R269 Unspecified abnormalities of gait and mobility: Secondary | ICD-10-CM | POA: Diagnosis not present

## 2018-04-04 DIAGNOSIS — I5032 Chronic diastolic (congestive) heart failure: Secondary | ICD-10-CM | POA: Diagnosis not present

## 2018-04-04 DIAGNOSIS — M810 Age-related osteoporosis without current pathological fracture: Secondary | ICD-10-CM | POA: Diagnosis not present

## 2018-04-04 DIAGNOSIS — R269 Unspecified abnormalities of gait and mobility: Secondary | ICD-10-CM | POA: Diagnosis not present

## 2018-04-04 DIAGNOSIS — M6281 Muscle weakness (generalized): Secondary | ICD-10-CM | POA: Diagnosis not present

## 2018-04-05 ENCOUNTER — Ambulatory Visit (INDEPENDENT_AMBULATORY_CARE_PROVIDER_SITE_OTHER): Payer: Medicare HMO | Admitting: *Deleted

## 2018-04-05 DIAGNOSIS — M8000XD Age-related osteoporosis with current pathological fracture, unspecified site, subsequent encounter for fracture with routine healing: Secondary | ICD-10-CM | POA: Diagnosis not present

## 2018-04-05 DIAGNOSIS — I495 Sick sinus syndrome: Secondary | ICD-10-CM | POA: Diagnosis not present

## 2018-04-05 DIAGNOSIS — M4800 Spinal stenosis, site unspecified: Secondary | ICD-10-CM | POA: Diagnosis not present

## 2018-04-05 DIAGNOSIS — I129 Hypertensive chronic kidney disease with stage 1 through stage 4 chronic kidney disease, or unspecified chronic kidney disease: Secondary | ICD-10-CM | POA: Diagnosis not present

## 2018-04-05 DIAGNOSIS — R350 Frequency of micturition: Secondary | ICD-10-CM | POA: Diagnosis not present

## 2018-04-05 DIAGNOSIS — M109 Gout, unspecified: Secondary | ICD-10-CM | POA: Diagnosis not present

## 2018-04-05 DIAGNOSIS — J309 Allergic rhinitis, unspecified: Secondary | ICD-10-CM | POA: Diagnosis not present

## 2018-04-07 DIAGNOSIS — M6281 Muscle weakness (generalized): Secondary | ICD-10-CM | POA: Diagnosis not present

## 2018-04-07 DIAGNOSIS — M810 Age-related osteoporosis without current pathological fracture: Secondary | ICD-10-CM | POA: Diagnosis not present

## 2018-04-07 DIAGNOSIS — R269 Unspecified abnormalities of gait and mobility: Secondary | ICD-10-CM | POA: Diagnosis not present

## 2018-04-07 DIAGNOSIS — I5032 Chronic diastolic (congestive) heart failure: Secondary | ICD-10-CM | POA: Diagnosis not present

## 2018-04-07 LAB — CUP PACEART REMOTE DEVICE CHECK
Battery Remaining Longevity: 102 mo
Battery Remaining Percentage: 100 %
Brady Statistic RA Percent Paced: 65 %
Brady Statistic RV Percent Paced: 0 %
Date Time Interrogation Session: 20200226092700
Implantable Lead Implant Date: 20180524
Implantable Lead Location: 753859
Implantable Lead Location: 753860
Implantable Lead Model: 7740
Implantable Lead Model: 7741
Implantable Lead Serial Number: 693708
Implantable Pulse Generator Implant Date: 20180524
Lead Channel Impedance Value: 559 Ohm
Lead Channel Impedance Value: 736 Ohm
Lead Channel Pacing Threshold Amplitude: 0.4 V
Lead Channel Pacing Threshold Amplitude: 1 V
Lead Channel Pacing Threshold Pulse Width: 0.4 ms
Lead Channel Pacing Threshold Pulse Width: 0.4 ms
Lead Channel Setting Pacing Amplitude: 2 V
Lead Channel Setting Pacing Pulse Width: 0.4 ms
Lead Channel Setting Sensing Sensitivity: 2 mV
MDC IDC LEAD IMPLANT DT: 20180524
MDC IDC LEAD SERIAL: 861246
MDC IDC SET LEADCHNL RV PACING AMPLITUDE: 2.4 V
Pulse Gen Serial Number: 308044

## 2018-04-11 DIAGNOSIS — M545 Low back pain: Secondary | ICD-10-CM | POA: Diagnosis not present

## 2018-04-12 ENCOUNTER — Telehealth: Payer: Self-pay | Admitting: Nurse Practitioner

## 2018-04-12 ENCOUNTER — Other Ambulatory Visit: Payer: Self-pay | Admitting: Orthopedic Surgery

## 2018-04-12 ENCOUNTER — Encounter: Payer: Self-pay | Admitting: Cardiology

## 2018-04-12 DIAGNOSIS — Z4789 Encounter for other orthopedic aftercare: Secondary | ICD-10-CM | POA: Diagnosis not present

## 2018-04-12 DIAGNOSIS — M810 Age-related osteoporosis without current pathological fracture: Secondary | ICD-10-CM | POA: Diagnosis not present

## 2018-04-12 DIAGNOSIS — M545 Low back pain, unspecified: Secondary | ICD-10-CM

## 2018-04-12 DIAGNOSIS — I5032 Chronic diastolic (congestive) heart failure: Secondary | ICD-10-CM | POA: Diagnosis not present

## 2018-04-12 DIAGNOSIS — M6281 Muscle weakness (generalized): Secondary | ICD-10-CM | POA: Diagnosis not present

## 2018-04-12 DIAGNOSIS — J45909 Unspecified asthma, uncomplicated: Secondary | ICD-10-CM | POA: Diagnosis not present

## 2018-04-12 DIAGNOSIS — S72442S Displaced fracture of lower epiphysis (separation) of left femur, sequela: Secondary | ICD-10-CM | POA: Diagnosis not present

## 2018-04-12 DIAGNOSIS — R269 Unspecified abnormalities of gait and mobility: Secondary | ICD-10-CM | POA: Diagnosis not present

## 2018-04-12 NOTE — Telephone Encounter (Signed)
Phone call to patient to verify medication list and allergies for myelogram procedure. Spoke with pt's granddaughter [name]. Pt instructed to hold Zoloft for 48hrs prior to myelogram appointment time. [name] verbalized understanding.

## 2018-04-12 NOTE — Progress Notes (Signed)
Remote pacemaker transmission.   

## 2018-04-14 ENCOUNTER — Other Ambulatory Visit: Payer: Self-pay | Admitting: Internal Medicine

## 2018-04-14 DIAGNOSIS — M6281 Muscle weakness (generalized): Secondary | ICD-10-CM | POA: Diagnosis not present

## 2018-04-14 DIAGNOSIS — M8000XD Age-related osteoporosis with current pathological fracture, unspecified site, subsequent encounter for fracture with routine healing: Secondary | ICD-10-CM

## 2018-04-14 DIAGNOSIS — R269 Unspecified abnormalities of gait and mobility: Secondary | ICD-10-CM | POA: Diagnosis not present

## 2018-04-14 DIAGNOSIS — M810 Age-related osteoporosis without current pathological fracture: Secondary | ICD-10-CM | POA: Diagnosis not present

## 2018-04-14 DIAGNOSIS — I5032 Chronic diastolic (congestive) heart failure: Secondary | ICD-10-CM | POA: Diagnosis not present

## 2018-04-18 DIAGNOSIS — I5032 Chronic diastolic (congestive) heart failure: Secondary | ICD-10-CM | POA: Diagnosis not present

## 2018-04-18 DIAGNOSIS — M6281 Muscle weakness (generalized): Secondary | ICD-10-CM | POA: Diagnosis not present

## 2018-04-18 DIAGNOSIS — R269 Unspecified abnormalities of gait and mobility: Secondary | ICD-10-CM | POA: Diagnosis not present

## 2018-04-18 DIAGNOSIS — M810 Age-related osteoporosis without current pathological fracture: Secondary | ICD-10-CM | POA: Diagnosis not present

## 2018-04-20 ENCOUNTER — Other Ambulatory Visit: Payer: Medicare HMO

## 2018-04-20 ENCOUNTER — Inpatient Hospital Stay
Admission: RE | Admit: 2018-04-20 | Discharge: 2018-04-20 | Disposition: A | Discharge status: Home / Self Care | Payer: Medicare HMO | Source: Ambulatory Visit | Attending: Orthopedic Surgery | Admitting: Orthopedic Surgery

## 2018-04-20 NOTE — Discharge Instructions (Signed)
Myelogram Discharge Instructions  1. Go home and rest quietly for the next 24 hours.  It is important to lie flat for the next 24 hours.  Get up only to go to the restroom.  You may lie in the bed or on a couch on your back, your stomach, your left side or your right side.  You may have one pillow under your head.  You may have pillows between your knees while you are on your side or under your knees while you are on your back.  2. DO NOT drive today.  Recline the seat as far back as it will go, while still wearing your seat belt, on the way home.  3. You may get up to go to the bathroom as needed.  You may sit up for 10 minutes to eat.  You may resume your normal diet and medications unless otherwise indicated.  Drink lots of extra fluids today and tomorrow.  4. The incidence of headache, nausea, or vomiting is about 5% (one in 20 patients).  If you develop a headache, lie flat and drink plenty of fluids until the headache goes away.  Caffeinated beverages may be helpful.  If you develop severe nausea and vomiting or a headache that does not go away with flat bed rest, call [phone].  5. You may resume normal activities after your 24 hours of bed rest is over; however, do not exert yourself strongly or do any heavy lifting tomorrow. If when you get up you have a headache when standing, go back to bed and force fluids for another 24 hours.  6. Call your physician for a follow-up appointment.  The results of your myelogram will be sent directly to your physician by the following day.  7. If you have any questions or if complications develop after you arrive home, please call [phone].  Discharge instructions have been explained to the patient.  The patient, or the person responsible for the patient, fully understands these instructions.  YOU MAY RESTART YOUR ZOLOFT TOMORROW 04/21/2018 AT 1:00PM.

## 2018-04-28 ENCOUNTER — Telehealth: Payer: Self-pay | Admitting: Pulmonary Disease

## 2018-04-28 DIAGNOSIS — I5032 Chronic diastolic (congestive) heart failure: Secondary | ICD-10-CM | POA: Diagnosis not present

## 2018-04-28 DIAGNOSIS — M6281 Muscle weakness (generalized): Secondary | ICD-10-CM | POA: Diagnosis not present

## 2018-04-28 DIAGNOSIS — M810 Age-related osteoporosis without current pathological fracture: Secondary | ICD-10-CM | POA: Diagnosis not present

## 2018-04-28 DIAGNOSIS — R269 Unspecified abnormalities of gait and mobility: Secondary | ICD-10-CM | POA: Diagnosis not present

## 2018-04-28 MED ORDER — DOXYCYCLINE HYCLATE 100 MG PO TABS: 100.0000 mg | ORAL_TABLET | Freq: Two times a day (BID) | ORAL | 0 refills | Status: DC

## 2018-04-28 NOTE — Telephone Encounter (Signed)
Primary Pulmonologist: Sood Last office visit and with whom: 09/28/17 with VS What do we see them for (pulmonary problems): pleural effusion Last OV assessment/plan:  Instructions   Return in about 4 months (around 01/28/2018).  Chest xray today  Follow up in 4 months       Was appointment offered to patient (explain)?  Pt's granddaughter and pt want to know recommendations for symptoms   Reason for call: Called and spoke with pt's granddaughter Ramiro Harvest who stated pt has had a cough with yellow mucus and congestion which has been going on x3-4 days.   Per Hillary, pt does not have any problems of SOB and stated vitals and O2 sats have been good.  Hillary states she has not been running any fever but Ramiro Harvest states that others in the house have had cold-like symptoms and Ramiro Harvest also stated her son has had temps which have been  ranging between 101-102 and other people in the house have also had temps. At this time, pt has not had any fevers, just cough with yellow mucus and congestion.  Hillary also states that pt has not had any body aches or chills but pt is more fatigued than usual.  Asked Hillary if anyone has traveled recently and Deidre Ala stated that her mother had traveled to [place] for three days and her mother also is a Secretary/administrator.  Kenney Houseman, please advise recs for pt and granddaughter Hillary. Thanks!

## 2018-04-28 NOTE — Telephone Encounter (Signed)
Please call to let patient know that I have called in an antibiotic. The patient and her family members with fevers should self isolate and make e-visits to be evaluated for possible covid testing.   Coronavirus (COVID-19) Are you at risk?  Are you at risk for the Coronavirus (COVID-19)?  To be considered HIGH RISK for Coronavirus (COVID-19), you have to meet the following criteria:  . Traveled to [place], [place], [place], [place] or [place]; or in the [place] to [place], [place], [place], or [place]; and have fever, cough, and shortness of breath within the last 2 weeks of travel OR . Been in close contact with a person diagnosed with COVID-19 within the last 2 weeks and have fever, cough, and shortness of breath . IF YOU DO NOT MEET THESE CRITERIA, YOU ARE CONSIDERED LOW RISK FOR COVID-19.  What to do if you are HIGH RISK for COVID-19?  Marland Kitchen If you are having a medical emergency, call 911. . Seek medical care right away. Before you go to a doctor's office, urgent care or emergency department, call ahead and tell them about your recent travel, contact with someone diagnosed with COVID-19, and your symptoms. You should receive instructions from your physician's office regarding next steps of care.  . When you arrive at healthcare provider, tell the healthcare staff immediately you have returned from visiting [place], [place], [place], [place] or [place]; or traveled in the [place] to [place], [place], [place], or [place]; in the last two weeks or you have been in close contact with a person diagnosed with COVID-19 in the last 2 weeks.   . Tell the health care staff about your symptoms: fever, cough and shortness of breath. . After you have been seen by a medical provider, you will be either: o Tested for (COVID-19) and discharged home on quarantine except to seek medical care if symptoms worsen, and asked to  - Stay home and avoid contact with others until you get your  results (4-5 days)  - Avoid travel on public transportation if possible (such as bus, train, or airplane) or o Sent to the Emergency Department by EMS for evaluation, COVID-19 testing, and possible admission depending on your condition and test results.  What to do if you are LOW RISK for COVID-19?  Reduce your risk of any infection by using the same precautions used for avoiding the common cold or flu:  Marland Kitchen Wash your hands often with soap and warm water for at least 20 seconds.  If soap and water are not readily available, use an alcohol-based hand sanitizer with at least 60% alcohol.  . If coughing or sneezing, cover your mouth and nose by coughing or sneezing into the elbow areas of your shirt or coat, into a tissue or into your sleeve (not your hands). . Avoid shaking hands with others and consider head nods or verbal greetings only. . Avoid touching your eyes, nose, or mouth with unwashed hands.  . Avoid close contact with people who are sick. . Avoid places or events with large numbers of people in one location, like concerts or sporting events. . Carefully consider travel plans you have or are making. . If you are planning any travel outside or inside the [place], visit the CDC's Travelers' Health webpage for the latest health notices. . If you have some symptoms but not all symptoms, continue to monitor at home and seek medical attention if your symptoms worsen. . If you are  having a medical emergency, call 911.   [place] / e-Visit: [email or web address]         [facility]: [facility]: [phone]                   [facility]: [phone]

## 2018-04-28 NOTE — Telephone Encounter (Signed)
Called and spoke with pt's granddaughter Ramiro Harvest letting her know the abx had been called in for pt and stated to her the family members that have been running fevers might need to have an e-visit to see if they need to be tested for covid. Hillary expressed understanding and stated she would discuss with her mom once she returned home and they would probably be calling the number to schedule the e-visit. I gave her the phone number for [place] urgent care she could call to do the e-visit. Nothing further needed.

## 2018-05-02 DIAGNOSIS — J411 Mucopurulent chronic bronchitis: Secondary | ICD-10-CM | POA: Diagnosis not present

## 2018-05-02 DIAGNOSIS — M48062 Spinal stenosis, lumbar region with neurogenic claudication: Secondary | ICD-10-CM | POA: Diagnosis not present

## 2018-05-02 DIAGNOSIS — M810 Age-related osteoporosis without current pathological fracture: Secondary | ICD-10-CM | POA: Diagnosis not present

## 2018-05-02 DIAGNOSIS — I129 Hypertensive chronic kidney disease with stage 1 through stage 4 chronic kidney disease, or unspecified chronic kidney disease: Secondary | ICD-10-CM | POA: Diagnosis not present

## 2018-05-02 DIAGNOSIS — R269 Unspecified abnormalities of gait and mobility: Secondary | ICD-10-CM | POA: Diagnosis not present

## 2018-05-02 DIAGNOSIS — I5032 Chronic diastolic (congestive) heart failure: Secondary | ICD-10-CM | POA: Diagnosis not present

## 2018-05-02 DIAGNOSIS — M6281 Muscle weakness (generalized): Secondary | ICD-10-CM | POA: Diagnosis not present

## 2018-05-04 DIAGNOSIS — M810 Age-related osteoporosis without current pathological fracture: Secondary | ICD-10-CM | POA: Diagnosis not present

## 2018-05-04 DIAGNOSIS — M1990 Unspecified osteoarthritis, unspecified site: Secondary | ICD-10-CM | POA: Diagnosis not present

## 2018-05-04 DIAGNOSIS — I129 Hypertensive chronic kidney disease with stage 1 through stage 4 chronic kidney disease, or unspecified chronic kidney disease: Secondary | ICD-10-CM | POA: Diagnosis not present

## 2018-05-04 DIAGNOSIS — I5032 Chronic diastolic (congestive) heart failure: Secondary | ICD-10-CM | POA: Diagnosis not present

## 2018-05-04 DIAGNOSIS — I4891 Unspecified atrial fibrillation: Secondary | ICD-10-CM | POA: Diagnosis not present

## 2018-05-04 DIAGNOSIS — E782 Mixed hyperlipidemia: Secondary | ICD-10-CM | POA: Diagnosis not present

## 2018-05-04 DIAGNOSIS — M6281 Muscle weakness (generalized): Secondary | ICD-10-CM | POA: Diagnosis not present

## 2018-05-04 DIAGNOSIS — R269 Unspecified abnormalities of gait and mobility: Secondary | ICD-10-CM | POA: Diagnosis not present

## 2018-05-04 DIAGNOSIS — J411 Mucopurulent chronic bronchitis: Secondary | ICD-10-CM | POA: Diagnosis not present

## 2018-05-04 DIAGNOSIS — I10 Essential (primary) hypertension: Secondary | ICD-10-CM | POA: Diagnosis not present

## 2018-05-04 DIAGNOSIS — D649 Anemia, unspecified: Secondary | ICD-10-CM | POA: Diagnosis not present

## 2018-05-04 DIAGNOSIS — R69 Illness, unspecified: Secondary | ICD-10-CM | POA: Diagnosis not present

## 2018-05-09 DIAGNOSIS — R269 Unspecified abnormalities of gait and mobility: Secondary | ICD-10-CM | POA: Diagnosis not present

## 2018-05-09 DIAGNOSIS — M6281 Muscle weakness (generalized): Secondary | ICD-10-CM | POA: Diagnosis not present

## 2018-05-09 DIAGNOSIS — I5032 Chronic diastolic (congestive) heart failure: Secondary | ICD-10-CM | POA: Diagnosis not present

## 2018-05-09 DIAGNOSIS — M810 Age-related osteoporosis without current pathological fracture: Secondary | ICD-10-CM | POA: Diagnosis not present

## 2018-05-13 DIAGNOSIS — M6281 Muscle weakness (generalized): Secondary | ICD-10-CM | POA: Diagnosis not present

## 2018-05-13 DIAGNOSIS — S72442S Displaced fracture of lower epiphysis (separation) of left femur, sequela: Secondary | ICD-10-CM | POA: Diagnosis not present

## 2018-05-13 DIAGNOSIS — J45909 Unspecified asthma, uncomplicated: Secondary | ICD-10-CM | POA: Diagnosis not present

## 2018-05-13 DIAGNOSIS — Z4789 Encounter for other orthopedic aftercare: Secondary | ICD-10-CM | POA: Diagnosis not present

## 2018-05-15 DIAGNOSIS — J069 Acute upper respiratory infection, unspecified: Secondary | ICD-10-CM | POA: Diagnosis not present

## 2018-05-16 DIAGNOSIS — J209 Acute bronchitis, unspecified: Secondary | ICD-10-CM | POA: Diagnosis not present

## 2018-05-31 DIAGNOSIS — R112 Nausea with vomiting, unspecified: Secondary | ICD-10-CM | POA: Diagnosis not present

## 2018-05-31 DIAGNOSIS — R350 Frequency of micturition: Secondary | ICD-10-CM | POA: Diagnosis not present

## 2018-06-01 DIAGNOSIS — R350 Frequency of micturition: Secondary | ICD-10-CM | POA: Diagnosis not present

## 2018-06-01 DIAGNOSIS — R112 Nausea with vomiting, unspecified: Secondary | ICD-10-CM | POA: Diagnosis not present

## 2018-06-08 DIAGNOSIS — I5032 Chronic diastolic (congestive) heart failure: Secondary | ICD-10-CM | POA: Diagnosis not present

## 2018-06-08 DIAGNOSIS — D649 Anemia, unspecified: Secondary | ICD-10-CM | POA: Diagnosis not present

## 2018-06-08 DIAGNOSIS — E782 Mixed hyperlipidemia: Secondary | ICD-10-CM | POA: Diagnosis not present

## 2018-06-08 DIAGNOSIS — I10 Essential (primary) hypertension: Secondary | ICD-10-CM | POA: Diagnosis not present

## 2018-06-08 DIAGNOSIS — I4891 Unspecified atrial fibrillation: Secondary | ICD-10-CM | POA: Diagnosis not present

## 2018-06-08 DIAGNOSIS — J411 Mucopurulent chronic bronchitis: Secondary | ICD-10-CM | POA: Diagnosis not present

## 2018-06-08 DIAGNOSIS — I129 Hypertensive chronic kidney disease with stage 1 through stage 4 chronic kidney disease, or unspecified chronic kidney disease: Secondary | ICD-10-CM | POA: Diagnosis not present

## 2018-06-08 DIAGNOSIS — M1990 Unspecified osteoarthritis, unspecified site: Secondary | ICD-10-CM | POA: Diagnosis not present

## 2018-06-08 DIAGNOSIS — R69 Illness, unspecified: Secondary | ICD-10-CM | POA: Diagnosis not present

## 2018-06-12 DIAGNOSIS — M6281 Muscle weakness (generalized): Secondary | ICD-10-CM | POA: Diagnosis not present

## 2018-06-12 DIAGNOSIS — J45909 Unspecified asthma, uncomplicated: Secondary | ICD-10-CM | POA: Diagnosis not present

## 2018-06-12 DIAGNOSIS — Z4789 Encounter for other orthopedic aftercare: Secondary | ICD-10-CM | POA: Diagnosis not present

## 2018-06-12 DIAGNOSIS — S72442S Displaced fracture of lower epiphysis (separation) of left femur, sequela: Secondary | ICD-10-CM | POA: Diagnosis not present

## 2018-06-20 DIAGNOSIS — K2900 Acute gastritis without bleeding: Secondary | ICD-10-CM | POA: Diagnosis not present

## 2018-06-22 ENCOUNTER — Other Ambulatory Visit: Payer: Medicare HMO

## 2018-07-05 ENCOUNTER — Ambulatory Visit (INDEPENDENT_AMBULATORY_CARE_PROVIDER_SITE_OTHER): Payer: Medicare HMO | Admitting: *Deleted

## 2018-07-05 DIAGNOSIS — I495 Sick sinus syndrome: Secondary | ICD-10-CM | POA: Diagnosis not present

## 2018-07-05 LAB — CUP PACEART REMOTE DEVICE CHECK
Battery Remaining Longevity: 96 mo
Battery Remaining Percentage: 100 %
Brady Statistic RA Percent Paced: 66 %
Brady Statistic RV Percent Paced: 0 %
Date Time Interrogation Session: 20200527145200
Implantable Lead Implant Date: 20180524
Implantable Lead Implant Date: 20180524
Implantable Lead Location: 753859
Implantable Lead Location: 753860
Implantable Lead Model: 7740
Implantable Lead Model: 7741
Implantable Lead Serial Number: 693708
Implantable Lead Serial Number: 861246
Implantable Pulse Generator Implant Date: 20180524
Lead Channel Impedance Value: 566 Ohm
Lead Channel Impedance Value: 767 Ohm
Lead Channel Pacing Threshold Amplitude: 0.4 V
Lead Channel Pacing Threshold Amplitude: 1 V
Lead Channel Pacing Threshold Pulse Width: 0.4 ms
Lead Channel Pacing Threshold Pulse Width: 0.4 ms
Lead Channel Setting Pacing Amplitude: 2 V
Lead Channel Setting Pacing Amplitude: 2.4 V
Lead Channel Setting Pacing Pulse Width: 0.4 ms
Lead Channel Setting Sensing Sensitivity: 2 mV
Pulse Gen Serial Number: 308044

## 2018-07-13 DIAGNOSIS — S72442S Displaced fracture of lower epiphysis (separation) of left femur, sequela: Secondary | ICD-10-CM | POA: Diagnosis not present

## 2018-07-13 DIAGNOSIS — J45909 Unspecified asthma, uncomplicated: Secondary | ICD-10-CM | POA: Diagnosis not present

## 2018-07-13 DIAGNOSIS — Z4789 Encounter for other orthopedic aftercare: Secondary | ICD-10-CM | POA: Diagnosis not present

## 2018-07-13 DIAGNOSIS — M6281 Muscle weakness (generalized): Secondary | ICD-10-CM | POA: Diagnosis not present

## 2018-07-14 ENCOUNTER — Encounter: Payer: Self-pay | Admitting: Cardiology

## 2018-07-14 NOTE — Progress Notes (Signed)
Remote pacemaker transmission.   

## 2018-07-25 ENCOUNTER — Telehealth: Payer: Self-pay

## 2018-07-25 NOTE — Telephone Encounter (Signed)
YOUR CARDIOLOGY TEAM HAS ARRANGED FOR AN E-VISIT FOR YOUR APPOINTMENT - PLEASE REVIEW IMPORTANT INFORMATION BELOW SEVERAL DAYS PRIOR TO YOUR APPOINTMENT  Due to the recent COVID-19 pandemic, we are transitioning in-person office visits to tele-medicine visits in an effort to decrease unnecessary exposure to our patients, their families, and staff. These visits are billed to your insurance just like a normal visit is. We also encourage you to sign up for MyChart if you have not already done so. You will need a smartphone if possible. For patients that do not have this, we can still complete the visit using a regular telephone but do prefer a smartphone to enable video when possible. You may have a family member that lives with you that can help. If possible, we also ask that you have a blood pressure cuff and scale at home to measure your blood pressure, heart rate and weight prior to your scheduled appointment. Patients with clinical needs that need an in-person evaluation and testing will still be able to come to the office if absolutely necessary. If you have any questions, feel free to call our office.     YOUR PROVIDER WILL BE USING THE FOLLOWING PLATFORM TO COMPLETE YOUR VISIT: Doximity  . IF USING MYCHART - How to Download the MyChart App to Your SmartPhone   - If Apple, go to App Store and type in MyChart in the search bar and download the app. If Android, ask patient to go to Google Play Store and type in MyChart in the search bar and download the app. The app is free but as with any other app downloads, your phone may require you to verify saved payment information or Apple/Android password.  - You will need to then log into the app with your MyChart username and password, and select  as your healthcare provider to link the account.  - When it is time for your visit, go to the MyChart app, find appointments, and click Begin Video Visit. Be sure to Select Allow for your device to  access the Microphone and Camera for your visit. You will then be connected, and your provider will be with you shortly.  **If you have any issues connecting or need assistance, please contact MyChart service desk [phone] ([phone])**  **If using a computer, in order to ensure the best quality for your visit, you will need to use either of the following Internet Browsers: Google Chrome or Microsoft Edge**  . IF USING DOXIMITY or [email or web address] - The staff will give you instructions on receiving your link to join the meeting the day of your visit.      2-3 DAYS BEFORE YOUR APPOINTMENT  You will receive a telephone call from one of our [facility] team members - your caller ID may say "Unknown caller." If this is a video visit, we will walk you through how to get the video launched on your phone. We will remind you check your blood pressure, heart rate and weight prior to your scheduled appointment. If you have an Apple Watch or Kardia, please upload any pertinent ECG strips the day before or morning of your appointment to MyChart. Our staff will also make sure you have reviewed the consent and agree to move forward with your scheduled tele-health visit.     THE DAY OF YOUR APPOINTMENT  Approximately 15 minutes prior to your scheduled appointment, you will receive a telephone call from one of HeartCare team - your caller ID may say "Unknown caller."    Our staff will confirm medications, vital signs for the day and any symptoms you may be experiencing. Please have this information available prior to the time of visit start. It may also be helpful for you to have a pad of paper and pen handy for any instructions given during your visit. They will also walk you through joining the smartphone meeting if this is a video visit.    CONSENT FOR TELE-HEALTH VISIT - PLEASE REVIEW  I hereby voluntarily request, consent and authorize [facility] and its employed or contracted physicians, physician  assistants, nurse practitioners or other licensed health care professionals (the Practitioner), to provide me with telemedicine health care services (the "Services") as deemed necessary by the treating Practitioner. I acknowledge and consent to receive the Services by the Practitioner via telemedicine. I understand that the telemedicine visit will involve communicating with the Practitioner through live audiovisual communication technology and the disclosure of certain medical information by electronic transmission. I acknowledge that I have been given the opportunity to request an in-person assessment or other available alternative prior to the telemedicine visit and am voluntarily participating in the telemedicine visit.  I understand that I have the right to withhold or withdraw my consent to the use of telemedicine in the course of my care at any time, without affecting my right to future care or treatment, and that the Practitioner or I may terminate the telemedicine visit at any time. I understand that I have the right to inspect all information obtained and/or recorded in the course of the telemedicine visit and may receive copies of available information for a reasonable fee.  I understand that some of the potential risks of receiving the Services via telemedicine include:  . Delay or interruption in medical evaluation due to technological equipment failure or disruption; . Information transmitted may not be sufficient (e.g. poor resolution of images) to allow for appropriate medical decision making by the Practitioner; and/or  . In rare instances, security protocols could fail, causing a breach of personal health information.  Furthermore, I acknowledge that it is my responsibility to provide information about my medical history, conditions and care that is complete and accurate to the best of my ability. I acknowledge that Practitioner's advice, recommendations, and/or decision may be based on  factors not within their control, such as incomplete or inaccurate data provided by me or distortions of diagnostic images or specimens that may result from electronic transmissions. I understand that the practice of medicine is not an exact science and that Practitioner makes no warranties or guarantees regarding treatment outcomes. I acknowledge that I will receive a copy of this consent concurrently upon execution via email to the email address I last provided but may also request a printed copy by calling the office of [facility].    I understand that my insurance will be billed for this visit.   I have read or had this consent read to me. . I understand the contents of this consent, which adequately explains the benefits and risks of the Services being provided via telemedicine.  . I have been provided ample opportunity to ask questions regarding this consent and the Services and have had my questions answered to my satisfaction. . I give my informed consent for the services to be provided through the use of telemedicine in my medical care  By participating in this telemedicine visit I agree to the above.  

## 2018-07-27 ENCOUNTER — Other Ambulatory Visit: Payer: Self-pay

## 2018-07-27 ENCOUNTER — Telehealth: Payer: Medicare HMO | Admitting: Internal Medicine

## 2018-07-31 ENCOUNTER — Telehealth: Payer: Self-pay | Admitting: Pulmonary Disease

## 2018-07-31 NOTE — Telephone Encounter (Signed)
Per pt's last OV with VS 09/28/17, the inhalers that we have on pt's med list include Symbicort 80 for pt to take twice daily. We also have albuterol inhaler for pt to use every 6 hours prn wheezing/sob.  Attempted to call pt's granddaughter Deidre Ala but unable to reach her. Left message for Deidre Ala to return call so we can go over inhalers with her that pt is supposed to be on per VS's last OV.

## 2018-08-02 MED ORDER — BUDESONIDE-FORMOTEROL FUMARATE 80-4.5 MCG/ACT IN AERO: 2.0000 | INHALATION_SPRAY | Freq: Two times a day (BID) | RESPIRATORY_TRACT | 5 refills | Status: DC

## 2018-08-02 NOTE — Telephone Encounter (Signed)
Called and spoke with Patient's granddaughter, Ramiro Harvest.  Symbicort 80 and Albuterol inhalers only inhaler listed on current med list.  Symbicort 80 refill sent to [facility] per Hillary's request.  Hillary stated they have albuterol inhalers at this time. Nothing further needed.

## 2018-08-04 ENCOUNTER — Telehealth: Payer: Self-pay | Admitting: Pulmonary Disease

## 2018-08-04 MED ORDER — FLUTICASONE-SALMETEROL 100-50 MCG/DOSE IN AEPB: 1.0000 | INHALATION_SPRAY | Freq: Two times a day (BID) | RESPIRATORY_TRACT | 5 refills | Status: DC

## 2018-08-04 NOTE — Telephone Encounter (Signed)
Spoke with [name]. She verbalized understanding. Advised her that I have sent in the [place].   Nothing further needed at time of call.

## 2018-08-04 NOTE — Telephone Encounter (Signed)
Called and spoke with pt's granddaughter Deidre Ala. Deidre Ala stated that pt's Symbicort is considered a Tier 4 and would be $200+ out of pocket for pt.   Deidre Ala said the [place] is covered by insurance so due to this, they were wondering if it would be okay for pt to be switched to the [place] from Symbicort 80.  Tonya, please advise if you are fine with [place] switching pt from the Symbicort 80 to [facility]. If it is okay for [place] to send in Rx of [place]ix[place] to pharamcy, please advise if we should send in the 149mcg dose, 230mcg dose, or the 527mcg dose as pt was on Symbicort 80 originally.  Thank you!

## 2018-08-04 NOTE — Telephone Encounter (Signed)
Yes. This should be fine and can start with the 100 dose. Thanks.

## 2018-08-08 ENCOUNTER — Encounter: Payer: Medicare HMO | Admitting: Internal Medicine

## 2018-08-09 ENCOUNTER — Other Ambulatory Visit: Payer: Self-pay | Admitting: Cardiology

## 2018-08-09 MED ORDER — AMLODIPINE BESYLATE 2.5 MG PO TABS: 2.5000 mg | ORAL_TABLET | Freq: Every day | ORAL | 0 refills | Status: DC

## 2018-08-09 NOTE — Telephone Encounter (Signed)
Pt's medication was sent to pt's pharmacy as requested. Confirmation received.  °

## 2018-08-09 NOTE — Telephone Encounter (Signed)
 *  STAT* If patient is at the pharmacy, call can be transferred to refill team.   1. Which medications need to be refilled? (please list name of each medication and dose if known) amLODipine (NORVASC) 2.5 MG tablet  2. Which pharmacy/location (including street and city if local pharmacy) is medication to be sent to? [facility] [place]  3. Do they need a 30 day or 90 day supply? 90  Patient is completely out of medicaiton

## 2018-08-12 DIAGNOSIS — M6281 Muscle weakness (generalized): Secondary | ICD-10-CM | POA: Diagnosis not present

## 2018-08-12 DIAGNOSIS — J45909 Unspecified asthma, uncomplicated: Secondary | ICD-10-CM | POA: Diagnosis not present

## 2018-08-12 DIAGNOSIS — S72442S Displaced fracture of lower epiphysis (separation) of left femur, sequela: Secondary | ICD-10-CM | POA: Diagnosis not present

## 2018-08-12 DIAGNOSIS — Z4789 Encounter for other orthopedic aftercare: Secondary | ICD-10-CM | POA: Diagnosis not present

## 2018-08-17 ENCOUNTER — Encounter (HOSPITAL_COMMUNITY): Payer: Self-pay | Admitting: *Deleted

## 2018-08-17 ENCOUNTER — Emergency Department (HOSPITAL_COMMUNITY)
Admission: EM | Admit: 2018-08-17 | Discharge: 2018-08-17 | Disposition: A | Discharge status: Home / Self Care | Payer: Medicare HMO | Attending: Emergency Medicine | Admitting: Emergency Medicine

## 2018-08-17 ENCOUNTER — Emergency Department (HOSPITAL_COMMUNITY): Payer: Medicare HMO

## 2018-08-17 DIAGNOSIS — I5032 Chronic diastolic (congestive) heart failure: Secondary | ICD-10-CM | POA: Diagnosis not present

## 2018-08-17 DIAGNOSIS — I130 Hypertensive heart and chronic kidney disease with heart failure and stage 1 through stage 4 chronic kidney disease, or unspecified chronic kidney disease: Secondary | ICD-10-CM | POA: Diagnosis not present

## 2018-08-17 DIAGNOSIS — N183 Chronic kidney disease, stage 3 (moderate): Secondary | ICD-10-CM | POA: Insufficient documentation

## 2018-08-17 DIAGNOSIS — Z85828 Personal history of other malignant neoplasm of skin: Secondary | ICD-10-CM | POA: Insufficient documentation

## 2018-08-17 DIAGNOSIS — Z950 Presence of cardiac pacemaker: Secondary | ICD-10-CM | POA: Diagnosis not present

## 2018-08-17 DIAGNOSIS — Z7982 Long term (current) use of aspirin: Secondary | ICD-10-CM | POA: Diagnosis not present

## 2018-08-17 DIAGNOSIS — T50901A Poisoning by unspecified drugs, medicaments and biological substances, accidental (unintentional), initial encounter: Secondary | ICD-10-CM

## 2018-08-17 DIAGNOSIS — R059 Cough, unspecified: Secondary | ICD-10-CM

## 2018-08-17 DIAGNOSIS — Z86718 Personal history of other venous thrombosis and embolism: Secondary | ICD-10-CM | POA: Diagnosis not present

## 2018-08-17 DIAGNOSIS — R5383 Other fatigue: Secondary | ICD-10-CM | POA: Insufficient documentation

## 2018-08-17 DIAGNOSIS — R69 Illness, unspecified: Secondary | ICD-10-CM | POA: Diagnosis not present

## 2018-08-17 DIAGNOSIS — Z79899 Other long term (current) drug therapy: Secondary | ICD-10-CM | POA: Insufficient documentation

## 2018-08-17 DIAGNOSIS — R05 Cough: Secondary | ICD-10-CM | POA: Diagnosis not present

## 2018-08-17 LAB — CBC WITH DIFFERENTIAL/PLATELET
Abs Immature Granulocytes: 0.02 10*3/uL (ref 0.00–0.07)
Basophils Absolute: 0 10*3/uL (ref 0.0–0.1)
Basophils Relative: 0 %
Eosinophils Absolute: 0.1 10*3/uL (ref 0.0–0.5)
Eosinophils Relative: 2 %
HCT: 31.5 % — ABNORMAL LOW (ref 36.0–46.0)
Hemoglobin: 10 g/dL — ABNORMAL LOW (ref 12.0–15.0)
Immature Granulocytes: 1 %
Lymphocytes Relative: 35 %
Lymphs Abs: 1.5 10*3/uL (ref 0.7–4.0)
MCH: 33 pg (ref 26.0–34.0)
MCHC: 31.7 g/dL (ref 30.0–36.0)
MCV: 104 fL — ABNORMAL HIGH (ref 80.0–100.0)
Monocytes Absolute: 0.2 10*3/uL (ref 0.1–1.0)
Monocytes Relative: 5 %
Neutro Abs: 2.4 10*3/uL (ref 1.7–7.7)
Neutrophils Relative %: 57 %
Platelets: 127 10*3/uL — ABNORMAL LOW (ref 150–400)
RBC: 3.03 MIL/uL — ABNORMAL LOW (ref 3.87–5.11)
RDW: 13.2 % (ref 11.5–15.5)
WBC: 4.3 10*3/uL (ref 4.0–10.5)
nRBC: 0 % (ref 0.0–0.2)

## 2018-08-17 LAB — COMPREHENSIVE METABOLIC PANEL
ALT: 14 U/L (ref 0–44)
AST: 19 U/L (ref 15–41)
Albumin: 3.9 g/dL (ref 3.5–5.0)
Alkaline Phosphatase: 87 U/L (ref 38–126)
Anion gap: 7 (ref 5–15)
BUN: 22 mg/dL (ref 8–23)
CO2: 27 mmol/L (ref 22–32)
Calcium: 9.1 mg/dL (ref 8.9–10.3)
Chloride: 106 mmol/L (ref 98–111)
Creatinine, Ser: 1.18 mg/dL — ABNORMAL HIGH (ref 0.44–1.00)
GFR calc Af Amer: 47 mL/min — ABNORMAL LOW (ref >60)
GFR calc non Af Amer: 41 mL/min — ABNORMAL LOW (ref >60)
Glucose, Bld: 105 mg/dL — ABNORMAL HIGH (ref 70–99)
Potassium: 4.6 mmol/L (ref 3.5–5.1)
Sodium: 140 mmol/L (ref 135–145)
Total Bilirubin: 0.6 mg/dL (ref 0.3–1.2)
Total Protein: 5.9 g/dL — ABNORMAL LOW (ref 6.5–8.1)

## 2018-08-17 LAB — URINALYSIS, ROUTINE W REFLEX MICROSCOPIC
Bilirubin Urine: NEGATIVE
Glucose, UA: NEGATIVE mg/dL
Hgb urine dipstick: NEGATIVE
Ketones, ur: NEGATIVE mg/dL
Leukocytes,Ua: NEGATIVE
Nitrite: NEGATIVE
Protein, ur: NEGATIVE mg/dL
Specific Gravity, Urine: 1.008 (ref 1.005–1.030)
pH: 7 (ref 5.0–8.0)

## 2018-08-17 LAB — CBG MONITORING, ED: Glucose-Capillary: 94 mg/dL (ref 70–99)

## 2018-08-17 LAB — TSH: TSH: 3.078 u[IU]/mL (ref 0.350–4.500)

## 2018-08-17 NOTE — ED Notes (Signed)
Have spoke with pts granddaughter to update pts plan of care. Unable to reach son at this time.

## 2018-08-17 NOTE — ED Notes (Signed)
Spoke with pts son on phone for update

## 2018-08-17 NOTE — ED Notes (Signed)
Patient verbalizes understanding of discharge instructions. Opportunity for questioning and answers were provided. Armband removed by staff, pt discharged from ED.  

## 2018-08-17 NOTE — Progress Notes (Signed)
Pt is an 89YOF presenting to the ED d/t accidental ingestion of grandsons evening medication. BP 129/62, Glu 105, HR 59  Medications: - Risperdal 4mg  - Benztropine - ASA - Vascepa (Omega-3-acid ethyl esters) - Plavix - Zetia - Allopurinol - Zoloft - Benicar - Synjardy (empagliflozin/metformin)  Will need to monitor BG (synjardy), BP (benicar), s/sx of bleeding (ASA/plavix), sedation level/drowsiness (risperdal) and anticholinergic effects such as: dry mouth, unable to urinate/defecate, AMS, vision disturbances and tachycardia (benztropine)  Zoloft, risperdal and benztropine can cause QT prolongation; however, pt has pacemaker so this will not be an issue.  Natale Lay, PharmD Candidate

## 2018-08-17 NOTE — ED Triage Notes (Signed)
To ED for eval after probably taking her sons medications. Risperdal - around 0930 this am. Pt granddaughter brought pt to ED and left phone number for communication. Writer spoke to granddaughter and told she is certain she took a dose of her sons medications. Pt states she did take his meds by mistake. Complains of dizziness. No pain. Confused at times - per granddaughter this is normal.   Granddaughter - Salli Quarry [phone]  Sons meds -  Plavix Allpurinol Risperdal ASA Vefcepa  Sygarte

## 2018-08-17 NOTE — ED Provider Notes (Addendum)
[facility] EMERGENCY DEPARTMENT Provider Note   CSN: [id number] Arrival date & time: 08/17/18  1204     History   Chief Complaint Chief Complaint  Patient presents with  . took wrong meds    HPI [name] is a 85 y.o. female.     The history is provided by the patient, a relative and medical records. No language interpreter was used.  Cough Cough characteristics:  Non-productive Sputum characteristics:  Nondescript Severity:  Mild Onset quality:  Gradual Duration:  2 weeks Timing:  Constant Progression:  Waxing and waning Chronicity:  New Smoker: no   Relieved by:  Nothing Worsened by:  Nothing Ineffective treatments:  None tried Associated symptoms: no chest pain, no chills, no diaphoresis, no fever, no headaches, no myalgias, no rash, no shortness of breath, no sinus congestion and no wheezing     Past Medical History:  Diagnosis Date  . Anemia    "I stay anemic"  . Arthritis   . Asthma    no problems recent  . Cataract   . Chronic diastolic CHF (congestive heart failure), NYHA class 2 ([place]) 03/19/2014   Echo 4/19: mod LVH, EF 55-60, no RWMA, Gr 1 DD, MAC, trivial MR, mod LAE, normal RVSF, PASP 19  . CKD (chronic kidney disease), stage III ([place])   . Complication of anesthesia    CONFUSED AFTER KNEE SURGERY  . Depression   . Diverticulitis   . DVT (deep venous thrombosis) ([place])    from birth control, left groin  . Essential hypertension   . Fibromyalgia 10 y.a.  . GERD (gastroesophageal reflux disease)   . Gout   . Hx of cardiovascular stress test    Lexiscan Myoview (8/15): apical attenuation artifact, no ischemia, EF 69% - low risk.  Marland Kitchen Hypercholesteremia   . Odynophagia   . Other abnormal glucose   . PAF (paroxysmal atrial fibrillation) ([place])   . Personal history of other diseases of digestive system   . Pneumonia    hx of  . Presence of permanent cardiac pacemaker   . Sinus bradycardia   . Skin cancer 2014   skin R  elbow & face  . Symptomatic bradycardia 06/2016    Patient Active Problem List   Diagnosis Date Noted  . Hip fracture ([place]) 02/10/2018  . Closed comminuted intertrochanteric fracture of left femur ([place]) 02/10/2018  . Pacemaker 07/06/2017  . Chronic respiratory failure with hypoxia ([place]) 06/09/2017  . Pleural effusion associated with pulmonary infection 06/09/2017  . Hyperkalemia 06/09/2017  . Pleuritic chest pain 06/09/2017  . Fever 05/14/2017  . Renal insufficiency   . Constipation 04/25/2017  . Urine finding 04/25/2017  . Posterior rhinorrhea 04/25/2017  . Acute upper respiratory infection 04/25/2017  . Thrombocytopenia ([place]) 01/15/2017  . CAP (community acquired pneumonia) 01/15/2017  . Symptomatic bradycardia 06/30/2016  . Hypertensive heart disease 04/01/2016  . Bradycardia 04/01/2016  . Fall 04/01/2016  . Sinus bradycardia   . CKD (chronic kidney disease), stage III ([place])   . Normochromic normocytic anemia   . Essential hypertension   . PAF (paroxysmal atrial fibrillation) ([place])   . Palpitations 09/18/2014  . Shortness of breath 03/19/2014  . Chronic diastolic CHF (congestive heart failure), NYHA class 2 ([place]) 03/19/2014  . Fatigue 09/11/2013  . Gastroenteritis 07/20/2013  . Abdominal pain 07/20/2013  . Abnormal x-ray of abdomen 07/20/2013  . Diarrhea 07/20/2013  . Generalized weakness 07/20/2013  . Obesity (BMI 30-39.9) 07/20/2013  . Hypercholesteremia   .  GERD (gastroesophageal reflux disease)   . Depression   . Asthma   . Gout   . Acute blood loss anemia 04/05/2013  . Osteoarthritis of left knee 04/04/2013  . Total knee replacement status 04/04/2013  . Pulmonary hypertension ([place]) 03/07/2013  . Anemia, unspecified 03/19/2012  . Unspecified deficiency anemia 03/19/2012  . Dizziness 07/09/2010  . Chest pain 07/30/2009  . HYPERTENSION, UNSPECIFIED 08/19/2008  . PAROXYSMAL ATRIAL FIBRILLATION 08/19/2008  . GASTROESOPHAGEAL REFLUX DISEASE, HX OF 08/19/2008     Past Surgical History:  Procedure Laterality Date  . ABDOMINAL HYSTERECTOMY     partial  . APPENDECTOMY  ~55years ago  . CHOLECYSTECTOMY  10/2005   Dr. Effie Shy  . COLONOSCOPY W/ BIOPSIES  2005, 2011   Dr. Bing Plume, "balloon inside for last one at [facility]"  . INTRAMEDULLARY (IM) NAIL INTERTROCHANTERIC Left 02/10/2018   Procedure: INTRAMEDULLARY (IM) NAIL INTERTROCHANTRIC;  Surgeon: Rod Can, MD;  Location: [facility];  Service: Orthopedics;  Laterality: Left;  . IR THORACENTESIS ASP PLEURAL SPACE W/IMG GUIDE  06/13/2017  . KNEE ARTHROSCOPY Right   . LAMINECTOMY  05/2007   foraminotomy, L4-5 laminectomy  . LARYNGOSCOPY / BRONCHOSCOPY / ESOPHAGOSCOPY  2010   Dr. Benjamine Mola  . PACEMAKER IMPLANT N/A 07/01/2016   Procedure: Pacemaker Implant;  Surgeon: Evans Lance, MD;  Location: [facility];  Service: Cardiovascular;  Laterality: N/A;  . SKIN CANCER EXCISION Right ~2005   r elbow  . TOTAL KNEE ARTHROPLASTY Left 04/04/2013   Procedure: LEFT TOTAL KNEE ARTHROPLASTY;  Surgeon: Tobi Bastos, MD;  Location: [facility];  Service: Orthopedics;  Laterality: Left;  . TUBAL LIGATION    . TUMOR REMOVAL Right ~ 20 years ago   tumor in between toes     OB History   No obstetric history on file.      Home Medications    Prior to Admission medications   Medication Sig Start Date End Date Taking? Authorizing Provider  acetaminophen (TYLENOL) 500 MG tablet Take 2 tablets (1,000 mg total) by mouth every 8 (eight) hours as needed for mild pain or headache. Patient taking differently: Take 500 mg by mouth every 8 (eight) hours as needed for mild pain or headache.  05/16/17  Yes Hongalgi, Lenis Dickinson, MD  albuterol (PROVENTIL HFA;VENTOLIN HFA) 108 (90 BASE) MCG/ACT inhaler Inhale 1 puff into the lungs every 6 (six) hours as needed for wheezing or shortness of breath. 11/13/14  Yes Dorothy Spark, MD  alendronate (FOSAMAX) 70 MG tablet Take 70 mg by mouth once a week. On Friday 02/13/14  Yes  [provider]  allopurinol (ZYLOPRIM) 100 MG tablet Take 1 tablet (100 mg total) by mouth daily. 04/01/16  Yes Haney, Alyssa A, MD  amLODipine (NORVASC) 2.5 MG tablet Take 1 tablet (2.5 mg total) by mouth daily. Please make overdue appt with Dr. Meda Coffee before anymore refills. 1st attempt 08/09/18  Yes Dorothy Spark, MD  aspirin EC 81 MG tablet Take 81 mg by mouth daily.    Yes [provider]  famotidine (PEPCID) 20 MG tablet Take 20 mg by mouth 2 (two) times daily.   Yes [provider]  ferrous sulfate 325 (65 FE) MG tablet Take 325 mg by mouth daily.    Yes [provider]  methocarbamol (ROBAXIN) 500 MG tablet Take 500 mg by mouth 2 (two) times daily as needed for muscle spasms.   Yes [provider]  montelukast (SINGULAIR) 10 MG tablet Take 10 mg  by mouth at bedtime.     Yes [provider]  ondansetron (ZOFRAN) 4 MG tablet Take 4 mg by mouth every 8 (eight) hours as needed for nausea or vomiting.   Yes [provider]  pravastatin (PRAVACHOL) 20 MG tablet Take 20 mg by mouth at bedtime.  09/17/14  Yes [provider]  sertraline (ZOLOFT) 50 MG tablet Take 50 mg by mouth daily.  03/15/12  Yes [provider]  budesonide-formoterol (SYMBICORT) 80-4.5 MCG/ACT inhaler Inhale 2 puffs into the lungs 2 (two) times daily. Patient not taking: Reported on 08/17/2018 08/02/18   Chesley Mires, MD  Fluticasone-Salmeterol [facility] INHUB) 100-50 MCG/DOSE AEPB Inhale 1 puff into the lungs 2 (two) times daily. Patient not taking: Reported on 08/17/2018 08/04/18   Chesley Mires, MD  QUEtiapine (SEROQUEL) 25 MG tablet Take 1 tablet (25 mg total) by mouth at bedtime. Patient not taking: Reported on 04/12/2018 02/13/18   Shelly Coss, MD    Family History Family History  Problem Relation Age of Onset  . Heart disease Mother   . Hypertension Mother   . Fainting Mother   . Arrhythmia Mother   . Diabetes Father   . Stroke Father   .  Hypertension Father   . Cancer Brother   . Sudden death Brother   . Heart attack Brother   . Cancer Sister   . Diabetes Sister   . Asthma Sister     Social History Social History   Tobacco Use  . Smoking status: Former Smoker    Years: 10.00    Types: Cigarettes    Quit date: 02/08/1973    Years since quitting: 45.5  . Smokeless tobacco: Never Used  Substance Use Topics  . Alcohol use: No  . Drug use: No     Allergies   Amoxicillin, Diltiazem hcl, Losartan potassium, Potassium-containing compounds, Sulfonamide derivatives, Zetia [ezetimibe], and Penicillins   Review of Systems Review of Systems  Constitutional: Positive for fatigue. Negative for chills, diaphoresis and fever.  Eyes: Negative for photophobia and visual disturbance.  Respiratory: Positive for cough. Negative for chest tightness, shortness of breath and wheezing.   Cardiovascular: Negative for chest pain, palpitations and leg swelling.  Gastrointestinal: Negative for abdominal pain, constipation, diarrhea, nausea and vomiting.  Genitourinary: Negative for dysuria and flank pain.  Musculoskeletal: Negative for back pain, myalgias, neck pain and neck stiffness.  Skin: Negative for rash.  Neurological: Positive for light-headedness. Negative for dizziness, weakness and headaches.  Psychiatric/Behavioral: Negative for agitation.  All other systems reviewed and are negative.    Physical Exam Updated Vital Signs BP 129/62   Pulse (!) 59   Temp 97.9 F (36.6 C) (Oral)   Resp 12   SpO2 97%   Physical Exam Vitals signs and nursing note reviewed.  Constitutional:      General: She is not in acute distress.    Appearance: She is well-developed.  HENT:     Head: Normocephalic and atraumatic.     Nose: No congestion or rhinorrhea.     Mouth/Throat:     Mouth: Mucous membranes are moist.     Pharynx: No oropharyngeal exudate or posterior oropharyngeal erythema.  Eyes:     Conjunctiva/sclera:  Conjunctivae normal.     Pupils: Pupils are equal, round, and reactive to light.  Neck:     Musculoskeletal: Neck supple. No muscular tenderness.  Cardiovascular:     Rate and Rhythm: Normal rate and regular rhythm.     Pulses: Normal pulses.  Heart sounds: No murmur.  Pulmonary:     Effort: Pulmonary effort is normal. No respiratory distress.     Breath sounds: Normal breath sounds. No wheezing, rhonchi or rales.  Chest:     Chest wall: No tenderness.  Abdominal:     Palpations: Abdomen is soft.     Tenderness: There is no abdominal tenderness. There is no right CVA tenderness or left CVA tenderness.  Musculoskeletal:        General: No tenderness.  Skin:    General: Skin is warm and dry.     Capillary Refill: Capillary refill takes less than 2 seconds.  Neurological:     General: No focal deficit present.     Mental Status: She is alert.     Sensory: No sensory deficit.     Motor: No weakness.  Psychiatric:        Mood and Affect: Mood normal.      ED Treatments / Results  Labs (all labs ordered are listed, but only abnormal results are displayed) Labs Reviewed  COMPREHENSIVE METABOLIC PANEL - Abnormal; Notable for the following components:      Result Value   Glucose, Bld 105 (*)    Creatinine, Ser 1.18 (*)    Total Protein 5.9 (*)    GFR calc non Af Amer 41 (*)    GFR calc Af Amer 47 (*)    All other components within normal limits  CBC WITH DIFFERENTIAL/PLATELET - Abnormal; Notable for the following components:   RBC 3.03 (*)    Hemoglobin 10.0 (*)    HCT 31.5 (*)    MCV 104.0 (*)    Platelets 127 (*)    All other components within normal limits  URINE CULTURE  URINALYSIS, ROUTINE W REFLEX MICROSCOPIC  TSH  CBG MONITORING, ED    EKG EKG Interpretation  Date/Time:  Thursday August 17 2018 13:41:47 EDT Ventricular Rate:  60 PR Interval:    QRS Duration: 101 QT Interval:  452 QTC Calculation: 452 R Axis:   -52 Text Interpretation:  PAced rhythm.   Incomplete RBBB and LAFB Consider right ventricular hypertrophy Probable left ventricular hypertrophy When compared to priro, similar paced rhythm.  No STEMI Confirmed by Antony Blackbird [phone]) on 08/17/2018 2:12:16 PM   Radiology Dg Chest Portable 1 View  Result Date: 08/17/2018 CLINICAL DATA:  Cough for the past 2 weeks. EXAM: PORTABLE CHEST 1 VIEW COMPARISON:  Portable chest dated 02/09/2018 and chest CT dated 06/03/2017. FINDINGS: Poor inspiration. Normal sized heart. Stable bilateral linear scarring and prominent central pulmonary arteries. Stable left subclavian bipolar pacemaker leads. Diffuse osteopenia. IMPRESSION: No acute abnormality. Electronically Signed   By: Claudie Revering M.D.   On: 08/17/2018 15:05    Procedures Procedures (including critical care time)  Medications Ordered in ED Medications - No data to display   Initial Impression / Assessment and Plan / ED Course  I have reviewed the triage vital signs and the nursing notes.  Pertinent labs & imaging results that were available during my care of the patient were reviewed by me and considered in my medical decision making (see chart for details).  Clinical Course as of Aug 17 1533  Thu Aug 17, 2018  1844 Patient was observed for period of hours with no arrhythmias.  She remains asymptomatic.  Will discharge as per plan.   [MB]    Clinical Course User Index [MB] Hayden Rasmussen, MD       [name] is a  85 y.o. female with a past medical history significant for hypertension, paroxysmal atrial fibrillation, CKD, CHF with pacemaker, asthma, diverticulitis, prior DVT, and fibromyalgia who presents for accidentally taking the wrong medications.  Patient is brought in for evaluation after she excellently took her son's medications.  I spoke with the patient's granddaughter  [phone]) who reports that patient makes the medication boxes for both her and her son.  Patient thinks she accidentally took the wrong  medications today and she is feeling fatigued and lightheaded.  The granddaughter reports that patient has had a similar reaction to medication in the past where she has been very fatigued.  I spoke with the granddaughter who reports that the son's medications include Risperdal 4 mg, benztropine 1 mg, aspirin 81 mg, Vefcepta, Zetia, allopurinol, sertraline, Benicar, and Synjardia.   Currently, patient is feeling better than she was earlier.  She thinks she took medication around 9:30 AM, several hours ago.  She denies any chest pain, palpitations, or new shortness of breath.  She does report she has had a mild cough for the last few weeks but denies any other symptoms.  She denies nausea, vomiting, urinary changes or changes with her bowel movements.    On exam, lungs are clear and chest is nontender.  Abdomen is nontender.  Patient's EKG shows a paced rhythm.  Exam otherwise unremarkable.  Patient resting comfortably in no distress.  I spoke with pharmacy who looked through all the medications and suspect patient will need several hours of monitoring.  As patient is paced, the QT C prolongation is less of a concern for multiple medications.  Patient will have a chest x-ray with reported mild cough for the last few weeks and will have screening laboratory testing.  If patient is still feeling well and feeling better at around 6:00, she will likely be safe for discharge home and instructed to not take her son's medications any further.  Glucose was normal on arrival.  Patient resting comfortably.  Care transferred to Dr. Melina Copa while awaiting results of labs and further monitoring.  Anticipate discharge home this evening after monitoring and observation.   Final Clinical Impressions(s) / ED Diagnoses   Final diagnoses:  Ingestion of unknown medication, accidental or unintentional, initial encounter  Fatigue, unspecified type  Cough    Clinical Impression: 1. Ingestion of unknown medication,  accidental or unintentional, initial encounter   2. Fatigue, unspecified type   3. Cough     Disposition: Care transferred to Dr. Melina Copa while awaiting reassessment.  Anticipate discharge home.  This note was prepared with assistance of Systems analyst. Occasional wrong-word or sound-a-like substitutions may have occurred due to the inherent limitations of voice recognition software.      Candon Caras, Gwenyth Allegra, MD 08/17/18 1636    Denisa Enterline, Gwenyth Allegra, MD 08/18/18 1535

## 2018-08-17 NOTE — Discharge Instructions (Addendum)
You were seen in the emergency department for accidentally taking medications.  You were observed here for period of time and did not experience any serious side effects.  Will be important for you to take all your medications as prescribed.  Please follow-up with your doctor return if any worsening symptoms.

## 2018-08-17 NOTE — ED Provider Notes (Signed)
Signout from Dr. Sherry Ruffing.  85 year old female here with an accidental overdose taking her son's medications. Physical Exam  BP 129/62   Pulse (!) 59   Temp 97.9 F (36.6 C) (Oral)   Resp 12   SpO2 97%   Physical Exam  ED Course/Procedures   Clinical Course as of Aug 17 1000  Thu Aug 17, 2018  1844 Patient was observed for period of hours with no arrhythmias.  She remains asymptomatic.  Will discharge as per plan.   [MB]    Clinical Course User Index [MB] Hayden Rasmussen, MD    Procedures  MDM  Plan is to observe on the monitor until around 6 PM and if no complications can be discharged.       Hayden Rasmussen, MD 08/18/18 1002

## 2018-08-18 LAB — URINE CULTURE: Culture: <10000 — AB

## 2018-08-24 DIAGNOSIS — H5213 Myopia, bilateral: Secondary | ICD-10-CM | POA: Diagnosis not present

## 2018-09-04 ENCOUNTER — Other Ambulatory Visit: Payer: Medicare HMO

## 2018-09-11 DIAGNOSIS — S72142D Displaced intertrochanteric fracture of left femur, subsequent encounter for closed fracture with routine healing: Secondary | ICD-10-CM | POA: Diagnosis not present

## 2018-09-12 DIAGNOSIS — Z4789 Encounter for other orthopedic aftercare: Secondary | ICD-10-CM | POA: Diagnosis not present

## 2018-09-12 DIAGNOSIS — M6281 Muscle weakness (generalized): Secondary | ICD-10-CM | POA: Diagnosis not present

## 2018-09-12 DIAGNOSIS — S72442S Displaced fracture of lower epiphysis (separation) of left femur, sequela: Secondary | ICD-10-CM | POA: Diagnosis not present

## 2018-09-12 DIAGNOSIS — J45909 Unspecified asthma, uncomplicated: Secondary | ICD-10-CM | POA: Diagnosis not present

## 2018-10-04 ENCOUNTER — Ambulatory Visit (INDEPENDENT_AMBULATORY_CARE_PROVIDER_SITE_OTHER): Payer: Medicare HMO | Admitting: *Deleted

## 2018-10-04 DIAGNOSIS — I495 Sick sinus syndrome: Secondary | ICD-10-CM | POA: Diagnosis not present

## 2018-10-05 LAB — CUP PACEART REMOTE DEVICE CHECK
Battery Remaining Longevity: 90 mo
Battery Remaining Percentage: 100 %
Brady Statistic RA Percent Paced: 67 %
Brady Statistic RV Percent Paced: 0 %
Date Time Interrogation Session: 20200827102000
Implantable Lead Implant Date: 20180524
Implantable Lead Implant Date: 20180524
Implantable Lead Location: 753859
Implantable Lead Location: 753860
Implantable Lead Model: 7740
Implantable Lead Model: 7741
Implantable Lead Serial Number: 693708
Implantable Lead Serial Number: 861246
Implantable Pulse Generator Implant Date: 20180524
Lead Channel Impedance Value: 550 Ohm
Lead Channel Impedance Value: 700 Ohm
Lead Channel Pacing Threshold Amplitude: 1.2 V
Lead Channel Pacing Threshold Pulse Width: 0.4 ms
Lead Channel Setting Pacing Amplitude: 2 V
Lead Channel Setting Pacing Amplitude: 2.4 V
Lead Channel Setting Pacing Pulse Width: 0.4 ms
Lead Channel Setting Sensing Sensitivity: 2 mV
Pulse Gen Serial Number: 308044

## 2018-10-07 DIAGNOSIS — M25561 Pain in right knee: Secondary | ICD-10-CM | POA: Diagnosis not present

## 2018-10-07 DIAGNOSIS — M545 Low back pain: Secondary | ICD-10-CM | POA: Diagnosis not present

## 2018-10-07 DIAGNOSIS — M25562 Pain in left knee: Secondary | ICD-10-CM | POA: Diagnosis not present

## 2018-10-10 ENCOUNTER — Ambulatory Visit: Payer: Medicare HMO | Admitting: Cardiology

## 2018-10-10 ENCOUNTER — Other Ambulatory Visit: Payer: Self-pay

## 2018-10-10 ENCOUNTER — Ambulatory Visit (INDEPENDENT_AMBULATORY_CARE_PROVIDER_SITE_OTHER): Payer: Medicare HMO | Admitting: Physician Assistant

## 2018-10-10 ENCOUNTER — Encounter (INDEPENDENT_AMBULATORY_CARE_PROVIDER_SITE_OTHER): Payer: Self-pay

## 2018-10-10 ENCOUNTER — Encounter: Payer: Self-pay | Admitting: Physician Assistant

## 2018-10-10 VITALS — BP 128/50 | HR 60 | Ht 64.0 in | Wt 162.1 lb

## 2018-10-10 DIAGNOSIS — Z950 Presence of cardiac pacemaker: Secondary | ICD-10-CM

## 2018-10-10 DIAGNOSIS — I5032 Chronic diastolic (congestive) heart failure: Secondary | ICD-10-CM | POA: Diagnosis not present

## 2018-10-10 DIAGNOSIS — I10 Essential (primary) hypertension: Secondary | ICD-10-CM | POA: Diagnosis not present

## 2018-10-10 DIAGNOSIS — I480 Paroxysmal atrial fibrillation: Secondary | ICD-10-CM | POA: Diagnosis not present

## 2018-10-10 DIAGNOSIS — E7800 Pure hypercholesterolemia, unspecified: Secondary | ICD-10-CM

## 2018-10-10 NOTE — Patient Instructions (Addendum)
Medication Instructions:  Your physician recommends that you continue on your current medications as directed. Please refer to the Current Medication list given to you today.  If you need a refill on your cardiac medications before your next appointment, please call your pharmacy.   Lab work: TODAY: BMET, CBC  If you have labs (blood work) drawn today and your tests are completely normal, you will receive your results only by: Marland Kitchen MyChart Message (if you have MyChart) OR . A paper copy in the mail If you have any lab test that is abnormal or we need to change your treatment, we will call you to review the results.  Testing/Procedures: None ordered  Follow-Up: Your physician recommends that you schedule a follow-up appointment with Dr. Lovena Le you are overdue to see him   At [facility], you and your health needs are our priority.  As part of our continuing mission to provide you with exceptional heart care, we have created designated Provider Care Teams.  These Care Teams include your primary Cardiologist (physician) and Advanced Practice Providers (APPs -  Physician Assistants and Nurse Practitioners) who all work together to provide you with the care you need, when you need it. . You will need a follow up appointment in 6 months.  Please call our office 2 months in advance to schedule this appointment.  You may see Ena Dawley, MD or one of the following Advanced Practice Providers on your designated Care Team:   . Lyda Jester, PA-C . Dayna Dunn, PA-C . Ermalinda Barrios, PA-C  Any Other Special Instructions Will Be Listed Below (If Applicable).   Two Gram Sodium Diet 2000 mg  What is Sodium? Sodium is a mineral found naturally in many foods. The most significant source of sodium in the diet is table salt, which is about 40% sodium.  Processed, convenience, and preserved foods also contain a large amount of sodium.  The body needs only 500 mg of sodium daily to function,  A  normal diet provides more than enough sodium even if you do not use salt.  Why Limit Sodium? A build up of sodium in the body can cause thirst, increased blood pressure, shortness of breath, and water retention.  Decreasing sodium in the diet can reduce edema and risk of heart attack or stroke associated with high blood pressure.  Keep in mind that there are many other factors involved in these health problems.  Heredity, obesity, lack of exercise, cigarette smoking, stress and what you eat all play a role.  General Guidelines:  Do not add salt at the table or in cooking.  One teaspoon of salt contains over 2 grams of sodium.  Read food labels  Avoid processed and convenience foods  Ask your dietitian before eating any foods not dicussed in the menu planning guidelines  Consult your physician if you wish to use a salt substitute or a sodium containing medication such as antacids.  Limit milk and milk products to 16 oz (2 cups) per day.  Shopping Hints:  READ LABELS!! "Dietetic" does not necessarily mean low sodium.  Salt and other sodium ingredients are often added to foods during processing.   Menu Planning Guidelines Food Group Choose More Often Avoid  Beverages (see also the milk group All fruit juices, low-sodium, salt-free vegetables juices, low-sodium carbonated beverages Regular vegetable or tomato juices, commercially softened water used for drinking or cooking  Breads and Cereals Enriched white, wheat, rye and pumpernickel bread, hard rolls and dinner rolls; muffins,  cornbread and waffles; most dry cereals, cooked cereal without added salt; unsalted crackers and breadsticks; low sodium or homemade bread crumbs Bread, rolls and crackers with salted tops; quick breads; instant hot cereals; pancakes; commercial bread stuffing; self-rising flower and biscuit mixes; regular bread crumbs or cracker crumbs  Desserts and Sweets Desserts and sweets mad with mild should be within allowance  Instant pudding mixes and cake mixes  Fats Butter or margarine; vegetable oils; unsalted salad dressings, regular salad dressings limited to 1 Tbs; light, sour and heavy cream Regular salad dressings containing bacon fat, bacon bits, and salt pork; snack dips made with instant soup mixes or processed cheese; salted nuts  Fruits Most fresh, frozen and canned fruits Fruits processed with salt or sodium-containing ingredient (some dried fruits are processed with sodium sulfites        Vegetables Fresh, frozen vegetables and low- sodium canned vegetables Regular canned vegetables, sauerkraut, pickled vegetables, and others prepared in brine; frozen vegetables in sauces; vegetables seasoned with ham, bacon or salt pork  Condiments, Sauces, Miscellaneous  Salt substitute with physician's approval; pepper, herbs, spices; vinegar, lemon or lime juice; hot pepper sauce; garlic powder, onion powder, low sodium soy sauce (1 Tbs.); low sodium condiments (ketchup, chili sauce, mustard) in limited amounts (1 tsp.) fresh ground horseradish; unsalted tortilla chips, pretzels, potato chips, popcorn, salsa (1/4 cup) Any seasoning made with salt including garlic salt, celery salt, onion salt, and seasoned salt; sea salt, rock salt, kosher salt; meat tenderizers; monosodium glutamate; mustard, regular soy sauce, barbecue, sauce, chili sauce, teriyaki sauce, steak sauce, Worcestershire sauce, and most flavored vinegars; canned gravy and mixes; regular condiments; salted snack foods, olives, picles, relish, horseradish sauce, catsup   Food preparation: Try these seasonings Meats:    Pork Sage, onion Serve with applesauce  Chicken Poultry seasoning, thyme, parsley Serve with cranberry sauce  Lamb Curry powder, rosemary, garlic, thyme Serve with mint sauce or jelly  Veal Marjoram, basil Serve with current jelly, cranberry sauce  Beef Pepper, bay leaf Serve with dry mustard, unsalted chive butter  Fish [place] leaf, dill  Serve with unsalted lemon butter, unsalted parsley butter  Vegetables:    Asparagus Lemon juice   Broccoli Lemon juice   Carrots Mustard dressing parsley, mint, nutmeg, glazed with unsalted butter and sugar   Green beans Marjoram, lemon juice, nutmeg,dill seed   Tomatoes Basil, marjoram, onion   Spice /blend for [employer]" 4 tsp ground thyme 1 tsp ground sage 3 tsp ground rosemary 4 tsp ground marjoram   Test your knowledge 1. A product that says "Salt Free" may still contain sodium. True or False 2. Garlic Powder and Hot Pepper Sauce an be used as alternative seasonings.True or False 3. Processed foods have more sodium than fresh foods.  True or False 4. Canned Vegetables have less sodium than froze True or False  WAYS TO DECREASE YOUR SODIUM INTAKE 1. Avoid the use of added salt in cooking and at the table.  Table salt (and other prepared seasonings which contain salt) is probably one of the greatest sources of sodium in the diet.  Unsalted foods can gain flavor from the sweet, sour, and butter taste sensations of herbs and spices.  Instead of using salt for seasoning, try the following seasonings with the foods listed.  Remember: how you use them to enhance natural food flavors is limited only by your creativity... Allspice-Meat, fish, eggs, fruit, peas, red and yellow vegetables Almond Extract-Fruit baked goods Anise Seed-Sweet breads, fruit, carrots, beets,  cottage cheese, cookies (tastes like licorice) Basil-Meat, fish, eggs, vegetables, rice, vegetables salads, soups, sauces Bay Leaf-Meat, fish, stews, poultry Burnet-Salad, vegetables (cucumber-like flavor) Caraway Seed-Bread, cookies, cottage cheese, meat, vegetables, cheese, rice Cardamon-Baked goods, fruit, soups Celery Powder or seed-Salads, salad dressings, sauces, meatloaf, soup, bread.Do not use  celery salt Chervil-Meats, salads, fish, eggs, vegetables, cottage cheese (parsley-like flavor) Chili Power-Meatloaf, chicken  cheese, corn, eggplant, egg dishes Chives-Salads cottage cheese, egg dishes, soups, vegetables, sauces Cilantro-Salsa, casseroles Cinnamon-Baked goods, fruit, pork, lamb, chicken, carrots Cloves-Fruit, baked goods, fish, pot roast, green beans, beets, carrots Coriander-Pastry, cookies, meat, salads, cheese (lemon-orange flavor) Cumin-Meatloaf, fish,cheese, eggs, cabbage,fruit pie (caraway flavor) [employer], fruit, eggs, fish, poultry, cottage cheese, vegetables Dill Seed-Meat, cottage cheese, poultry, vegetables, fish, salads, bread Fennel Seed-Bread, cookies, apples, pork, eggs, fish, beets, cabbage, cheese, Licorice-like flavor Garlic-(buds or powder) Salads, meat, poultry, fish, bread, butter, vegetables, potatoes.Do not  use garlic salt Ginger-Fruit, vegetables, baked goods, meat, fish, poultry Horseradish Root-Meet, vegetables, butter Lemon Juice or Extract-Vegetables, fruit, tea, baked goods, fish salads Mace-Baked goods fruit, vegetables, fish, poultry (taste like nutmeg) Maple Extract-Syrups Marjoram-Meat, chicken, fish, vegetables, breads, green salads (taste like Sage) Mint-Tea, lamb, sherbet, vegetables, desserts, carrots, cabbage Mustard, Dry or Seed-Cheese, eggs, meats, vegetables, poultry Nutmeg-Baked goods, fruit, chicken, eggs, vegetables, desserts Onion Powder-Meat, fish, poultry, vegetables, cheese, eggs, bread, rice salads (Do not use   Onion salt) Orange Extract-Desserts, baked goods Oregano-Pasta, eggs, cheese, onions, pork, lamb, fish, chicken, vegetables, green salads Paprika-Meat, fish, poultry, eggs, cheese, vegetables Parsley Flakes-Butter, vegetables, meat fish, poultry, eggs, bread, salads (certain forms may   Contain sodium Pepper-Meat fish, poultry, vegetables, eggs Peppermint Extract-Desserts, baked goods Poppy Seed-Eggs, bread, cheese, fruit dressings, baked goods, noodles, vegetables, cottage  [employer],  poultry, meat, fish, cauliflower, turnips,eggs bread Saffron-Rice, bread, veal, chicken, fish, eggs Sage-Meat, fish, poultry, onions, eggplant, tomateos, pork, stews Savory-Eggs, salads, poultry, meat, rice, vegetables, soups, pork Tarragon-Meat, poultry, fish, eggs, butter, vegetables (licorice-like flavor)  Thyme-Meat, poultry, fish, eggs, vegetables, (clover-like flavor), sauces, soups Tumeric-Salads, butter, eggs, fish, rice, vegetables (saffron-like flavor) Vanilla Extract-Baked goods, candy Vinegar-Salads, vegetables, meat marinades Walnut Extract-baked goods, candy  2. Choose your Foods Wisely   The following is a list of foods to avoid which are high in sodium:  Meats-Avoid all smoked, canned, salt cured, dried and kosher meat and fish as well as Anchovies   [employer] meats:Bologna, Liverwurst, Pastrami Canned meat or fish  Marinated herring Caviar    Pepperoni Corned Beef   Pizza Dried chipped beef  Salami Frozen breaded fish or meat Salt pork Frankfurters or hot dogs  Sardines Gefilte fish   Sausage Ham (boiled ham, Proscuitto Smoked butt    spiced ham)   Spam      TV Dinners Vegetables Canned vegetables (Regular) Relish Canned mushrooms  Sauerkraut Olives    Tomato juice Pickles  Bakery and Dessert Products Canned puddings  Cream pies Cheesecake   Decorated cakes Cookies  Beverages/Juices Tomato juice, regular  Gatorade   V-8 vegetable juice, regular  Breads and Cereals Biscuit mixes   Salted potato chips, corn chips, pretzels Bread stuffing mixes  Salted crackers and rolls Pancake and waffle mixes Self-rising flour  Seasonings Accent    Meat sauces Barbecue sauce  Meat tenderizer Catsup    Monosodium glutamate (MSG) Celery salt   Onion salt Chili sauce   Prepared mustard Garlic salt   Salt, seasoned salt, sea salt Gravy mixes   Soy sauce Horseradish  Steak sauce Ketchup   Tartar sauce Lite salt    Teriyaki sauce Marinade  mixes   Worcestershire sauce  Others Baking powder   Cocoa and cocoa mixes Baking soda   Commercial casserole mixes Candy-caramels, chocolate  Dehydrated soups    Bars, fudge,nougats  Instant rice and pasta mixes Canned broth or soup  Maraschino cherries Cheese, aged and processed cheese and cheese spreads  Learning Assessment Quiz  Indicated T (for True) or F (for False) for each of the following statements:  1. _____ Fresh fruits and vegetables and unprocessed grains are generally low in sodium 2. _____ Water may contain a considerable amount of sodium, depending on the source 3. _____ You can always tell if a food is high in sodium by tasting it 4. _____ Certain laxatives my be high in sodium and should be avoided unless prescribed   by a physician or pharmacist 5. _____ Salt substitutes may be used freely by anyone on a sodium restricted diet 6. _____ Sodium is present in table salt, food additives and as a natural component of   most foods 7. _____ Table salt is approximately 90% sodium 8. _____ Limiting sodium intake may help prevent excess fluid accumulation in the body 9. _____ On a sodium-restricted diet, seasonings such as bouillon soy sauce, and    cooking wine should be used in place of table salt 10. _____ On an ingredient list, a product which lists monosodium glutamate as the first   ingredient is an appropriate food to include on a low sodium diet  Circle the best answer(s) to the following statements (Hint: there may be more than one correct answer)  11. On a low-sodium diet, some acceptable snack items are:    A. Olives  F. Bean dip   K. Grapefruit juice    B. Salted Pretzels G. Commercial Popcorn   L. Canned peaches    C. Carrot Sticks  H. Bouillon   M. Unsalted nuts   D. [place] fries  I. Peanut butter crackers N. Salami   E. Sweet pickles J. Tomato Juice   O. Pizza  12.  Seasonings that may be used freely on a reduced - sodium diet include   A. Lemon  wedges F.Monosodium glutamate K. Celery seed    B.Soysauce   G. Pepper   L. Mustard powder   C. Sea salt  H. Cooking wine  M. Onion flakes   D. Vinegar  E. Prepared horseradish N. Salsa   E. Sage   J. Worcestershire sauce  O. Chutney

## 2018-10-10 NOTE — Progress Notes (Signed)
Cardiology Office Note    Date:  10/10/2018   ID:  [name], DOB 01/05/1928, MRN [id number]  PCP:  [name], MD  Cardiologist: Ena Dawley, MD EPS: Cristopher Peru, MD  No chief complaint on file.   History of Present Illness:  [name] is a 85 y.o. female with history of PAF-not felt to be candidate for anticoagulation, chronic diastolic CHF, sinus node dysfunction S/P Pacemaker, mild pulmonary HTN, HLD, CKD,, history of chest pain and shortness of breath S/P thoracentesis for pleural effusion last year.  Patient in ED 08/2018 for observation after taking her grandson's meds accidentally.   Occasionally has a skipping in her chest with a little hurting usually when she lays down but it goes away quickly. Had a fractured femur in Jan and has had leg pain since. Also has swelling off/on. She likes her salt but has slowed down some.     Past Medical History:  Diagnosis Date  . Anemia    "I stay anemic"  . Arthritis   . Asthma    no problems recent  . Cataract   . Chronic diastolic CHF (congestive heart failure), NYHA class 2 ([place]) 03/19/2014   Echo 4/19: mod LVH, EF 55-60, no RWMA, Gr 1 DD, MAC, trivial MR, mod LAE, normal RVSF, PASP 19  . CKD (chronic kidney disease), stage III ([place])   . Complication of anesthesia    CONFUSED AFTER KNEE SURGERY  . Depression   . Diverticulitis   . DVT (deep venous thrombosis) ([place])    from birth control, left groin  . Essential hypertension   . Fibromyalgia 10 y.a.  . GERD (gastroesophageal reflux disease)   . Gout   . Hx of cardiovascular stress test    Lexiscan Myoview (8/15): apical attenuation artifact, no ischemia, EF 69% - low risk.  Marland Kitchen Hypercholesteremia   . Odynophagia   . Other abnormal glucose   . PAF (paroxysmal atrial fibrillation) ([place])   . Personal history of other diseases of digestive system   . Pneumonia    hx of  . Presence of permanent cardiac pacemaker   . Sinus bradycardia   .  Skin cancer 2014   skin R elbow & face  . Symptomatic bradycardia 06/2016    Past Surgical History:  Procedure Laterality Date  . ABDOMINAL HYSTERECTOMY     partial  . APPENDECTOMY  ~55years ago  . CHOLECYSTECTOMY  10/2005   Dr. Effie Shy  . COLONOSCOPY W/ BIOPSIES  2005, 2011   Dr. Bing Plume, "balloon inside for last one at [facility]"  . INTRAMEDULLARY (IM) NAIL INTERTROCHANTERIC Left 02/10/2018   Procedure: INTRAMEDULLARY (IM) NAIL INTERTROCHANTRIC;  Surgeon: Rod Can, MD;  Location: [place];  Service: Orthopedics;  Laterality: Left;  . IR THORACENTESIS ASP PLEURAL SPACE W/IMG GUIDE  06/13/2017  . KNEE ARTHROSCOPY Right   . LAMINECTOMY  05/2007   foraminotomy, L4-5 laminectomy  . LARYNGOSCOPY / BRONCHOSCOPY / ESOPHAGOSCOPY  2010   Dr. Benjamine Mola  . PACEMAKER IMPLANT N/A 07/01/2016   Procedure: Pacemaker Implant;  Surgeon: Evans Lance, MD;  Location: [facility];  Service: Cardiovascular;  Laterality: N/A;  . SKIN CANCER EXCISION Right ~2005   r elbow  . TOTAL KNEE ARTHROPLASTY Left 04/04/2013   Procedure: LEFT TOTAL KNEE ARTHROPLASTY;  Surgeon: Tobi Bastos, MD;  Location: [facility];  Service: Orthopedics;  Laterality: Left;  . TUBAL LIGATION    . TUMOR REMOVAL Right ~ 20 years ago  tumor in between toes    Current Medications: Current Meds  Medication Sig  . acetaminophen (TYLENOL) 500 MG tablet Take 2 tablets (1,000 mg total) by mouth every 8 (eight) hours as needed for mild pain or headache. (Patient taking differently: Take 500 mg by mouth every 8 (eight) hours as needed for mild pain or headache. )  . albuterol (PROVENTIL HFA;VENTOLIN HFA) 108 (90 BASE) MCG/ACT inhaler Inhale 1 puff into the lungs every 6 (six) hours as needed for wheezing or shortness of breath.  Marland Kitchen alendronate (FOSAMAX) 70 MG tablet Take 70 mg by mouth once a week. On Friday  . allopurinol (ZYLOPRIM) 100 MG tablet Take 1 tablet (100 mg total) by mouth daily.  Marland Kitchen amLODipine (NORVASC) 2.5 MG  tablet Take 1 tablet (2.5 mg total) by mouth daily. Please make overdue appt with Dr. Meda Coffee before anymore refills. 1st attempt  . aspirin EC 81 MG tablet Take 81 mg by mouth daily.   . budesonide-formoterol (SYMBICORT) 80-4.5 MCG/ACT inhaler Inhale 2 puffs into the lungs 2 (two) times daily.  . famotidine (PEPCID) 20 MG tablet Take 20 mg by mouth 2 (two) times daily.  . ferrous sulfate 325 (65 FE) MG tablet Take 325 mg by mouth daily.   . methocarbamol (ROBAXIN) 500 MG tablet Take 500 mg by mouth 2 (two) times daily as needed for muscle spasms.  . montelukast (SINGULAIR) 10 MG tablet Take 10 mg by mouth at bedtime.    . ondansetron (ZOFRAN) 4 MG tablet Take 4 mg by mouth every 8 (eight) hours as needed for nausea or vomiting.  . pravastatin (PRAVACHOL) 20 MG tablet Take 20 mg by mouth at bedtime.   Marland Kitchen QUEtiapine (SEROQUEL) 25 MG tablet Take 1 tablet (25 mg total) by mouth at bedtime. (Patient taking differently: Take 25 mg by mouth daily. )  . sertraline (ZOLOFT) 50 MG tablet Take 50 mg by mouth daily.      Allergies:   Amoxicillin, Diltiazem, Diltiazem hcl, Losartan potassium, Potassium-containing compounds, Sulfonamide derivatives, Zetia [ezetimibe], and Penicillins   Social History   Socioeconomic History  . Marital status: Widowed    Spouse name: Not on file  . Number of children: Not on file  . Years of education: Not on file  . Highest education level: Not on file  Occupational History  . Not on file  Social Needs  . Financial resource strain: Not on file  . Food insecurity    Worry: Not on file    Inability: Not on file  . Transportation needs    Medical: Not on file    Non-medical: Not on file  Tobacco Use  . Smoking status: Former Smoker    Years: 10.00    Types: Cigarettes    Quit date: 02/08/1973    Years since quitting: 45.6  . Smokeless tobacco: Never Used  Substance and Sexual Activity  . Alcohol use: No  . Drug use: No  . Sexual activity: Not Currently   Lifestyle  . Physical activity    Days per week: Not on file    Minutes per session: Not on file  . Stress: Not on file  Relationships  . Social Herbalist on phone: Not on file    Gets together: Not on file    Attends religious service: Not on file    Active member of club or organization: Not on file    Attends meetings of clubs or organizations: Not on file    Relationship  status: Not on file  Other Topics Concern  . Not on file  Social History Narrative  . Not on file     Family History:  The patient's   family history includes Arrhythmia in her mother; Asthma in her sister; Cancer in her brother and sister; Diabetes in her father and sister; Fainting in her mother; Heart attack in her brother; Heart disease in her mother; Hypertension in her father and mother; Stroke in her father; Sudden death in her brother.   ROS:   Please see the history of present illness.    ROS All other systems reviewed and are negative.   PHYSICAL EXAM:   VS:  BP (!) 128/50   Pulse 60   Ht _0  (1.626 m)   Wt 162 lb 1.9 oz (73.5 kg)   SpO2 96%   BMI 27.83 kg/m   Physical Exam  GEN: Well nourished, well developed, in no acute distress  Neck: no JVD, carotid bruits, or masses [id number];0/5 systolic murmur LSB Respiratory:  clear to auscultation bilaterally, normal work of breathing GI: soft, nontender, nondistended, + BS WPV:XYIAX of edema bilaterally otherwise without cyanosis, clubbing. Good distal pulses Neuro:  Alert and Oriented x 3 Psych: euthymic mood, full affect  Wt Readings from Last 3 Encounters:  10/10/18 162 lb 1.9 oz (73.5 kg)  02/10/18 163 lb 2.3 oz (74 kg)  09/28/17 162 lb 6.4 oz (73.7 kg)      Studies/Labs Reviewed:   EKG:  EKG is not ordered today.    Recent Labs: 08/17/2018: ALT 14; BUN 22; Creatinine, Ser 1.18; Hemoglobin 10.0; Platelets 127; Potassium 4.6; Sodium 140; TSH 3.078   Lipid Panel    Component Value Date/Time   CHOL  06/07/2008 0405     118        ATP III CLASSIFICATION:  <200     mg/dL   Desirable  200-239  mg/dL   Borderline High  >=240    mg/dL   High          TRIG 85 06/07/2008 0405   HDL 35 (L) 06/07/2008 0405   CHOLHDL 3.4 06/07/2008 0405   VLDL 17 06/07/2008 0405   LDLCALC  06/07/2008 0405    66        Total Cholesterol/HDL:CHD Risk Coronary Heart Disease Risk Table                     Men   Women  1/2 Average Risk   3.4   3.3  Average Risk       5.0   4.4  2 X Average Risk   9.6   7.1  3 X Average Risk  23.4   11.0        Use the calculated Patient Ratio above and the CHD Risk Table to determine the patient's CHD Risk.        ATP III CLASSIFICATION (LDL):  <100     mg/dL   Optimal  100-129  mg/dL   Near or Above                    Optimal  130-159  mg/dL   Borderline  160-189  mg/dL   High  >190     mg/dL   Very High    Additional studies/ records that were reviewed today include:  Echo 4/18/19Study Conclusions   - Left ventricle: The cavity size was normal. Wall thickness was   increased in a pattern of  moderate LVH. Systolic function was   normal. The estimated ejection fraction was in the range of 55%   to 60%. Wall motion was normal; there were no regional wall   motion abnormalities. Doppler parameters are consistent with   abnormal left ventricular relaxation (grade 1 diastolic   dysfunction). - Aortic valve: There was no stenosis. - Mitral valve: Mildly calcified annulus. There was trivial   regurgitation. - Left atrium: The atrium was moderately dilated. - Right ventricle: The cavity size was normal. Pacer wire or   catheter noted in right ventricle. Systolic function was normal. - Tricuspid valve: Peak RV-RA gradient (S): 16 mm Hg. - Pulmonary arteries: PA peak pressure: 19 mm Hg (S). - Inferior vena cava: The vessel was normal in size. The   respirophasic diameter changes were in the normal range (>= 50%),   consistent with normal central venous pressure.   Impressions:   -  Normal LV size with moderate LV hypertrophy. EF 55-60%. Normal RV   size and systolic function. No significant valvular   abnormalities.     ASSESSMENT:    1. PAF (paroxysmal atrial fibrillation) ([place])   2. Chronic diastolic CHF (congestive heart failure), NYHA class 2 ([place])   3. Essential hypertension   4. Pacemaker   5. Hypercholesteremia      PLAN:  In order of problems listed above:  PAF not anticoagulation candidate, HR is regular on exam  Chronic diastolic CHF echo 02/9377 normal LVEF with mod LVH-eats a lot of salt. Will give compression hose and 2 gm sodium diet. Check surveillance labs  Essential HTN BP stable  Pacemaker followed by Dr. Burnett Sheng f/u-will schedule  HLD LDL 73 09/2017. Not fasting today    Medication Adjustments/Labs and Tests Ordered: Current medicines are reviewed at length with the patient today.  Concerns regarding medicines are outlined above.  Medication changes, Labs and Tests ordered today are listed in the Patient Instructions below. Patient Instructions   Medication Instructions:  Your physician recommends that you continue on your current medications as directed. Please refer to the Current Medication list given to you today.  If you need a refill on your cardiac medications before your next appointment, please call your pharmacy.   Lab work: TODAY: BMET, CBC  If you have labs (blood work) drawn today and your tests are completely normal, you will receive your results only by: Marland Kitchen MyChart Message (if you have MyChart) OR . A paper copy in the mail If you have any lab test that is abnormal or we need to change your treatment, we will call you to review the results.  Testing/Procedures: None ordered  Follow-Up: Your physician recommends that you schedule a follow-up appointment with Dr. Lovena Le you are overdue to see him   At [facility], you and your health needs are our priority.  As part of our continuing mission to provide  you with exceptional heart care, we have created designated Provider Care Teams.  These Care Teams include your primary Cardiologist (physician) and Advanced Practice Providers (APPs -  Physician Assistants and Nurse Practitioners) who all work together to provide you with the care you need, when you need it. . You will need a follow up appointment in 6 months.  Please call our office 2 months in advance to schedule this appointment.  You may see Ena Dawley, MD or one of the following Advanced Practice Providers on your designated Care Team:   . Lyda Jester, PA-C . Dayna Dunn, PA-C .  Ermalinda Barrios, PA-C  Any Other Special Instructions Will Be Listed Below (If Applicable).   Two Gram Sodium Diet 2000 mg  What is Sodium? Sodium is a mineral found naturally in many foods. The most significant source of sodium in the diet is table salt, which is about 40% sodium.  Processed, convenience, and preserved foods also contain a large amount of sodium.  The body needs only 500 mg of sodium daily to function,  A normal diet provides more than enough sodium even if you do not use salt.  Why Limit Sodium? A build up of sodium in the body can cause thirst, increased blood pressure, shortness of breath, and water retention.  Decreasing sodium in the diet can reduce edema and risk of heart attack or stroke associated with high blood pressure.  Keep in mind that there are many other factors involved in these health problems.  Heredity, obesity, lack of exercise, cigarette smoking, stress and what you eat all play a role.  General Guidelines:  Do not add salt at the table or in cooking.  One teaspoon of salt contains over 2 grams of sodium.  Read food labels  Avoid processed and convenience foods  Ask your dietitian before eating any foods not dicussed in the menu planning guidelines  Consult your physician if you wish to use a salt substitute or a sodium containing medication such as antacids.   Limit milk and milk products to 16 oz (2 cups) per day.  Shopping Hints:  READ LABELS!! "Dietetic" does not necessarily mean low sodium.  Salt and other sodium ingredients are often added to foods during processing.   Menu Planning Guidelines Food Group Choose More Often Avoid  Beverages (see also the milk group All fruit juices, low-sodium, salt-free vegetables juices, low-sodium carbonated beverages Regular vegetable or tomato juices, commercially softened water used for drinking or cooking  Breads and Cereals Enriched white, wheat, rye and pumpernickel bread, hard rolls and dinner rolls; muffins, cornbread and waffles; most dry cereals, cooked cereal without added salt; unsalted crackers and breadsticks; low sodium or homemade bread crumbs Bread, rolls and crackers with salted tops; quick breads; instant hot cereals; pancakes; commercial bread stuffing; self-rising flower and biscuit mixes; regular bread crumbs or cracker crumbs  Desserts and Sweets Desserts and sweets mad with mild should be within allowance Instant pudding mixes and cake mixes  Fats Butter or margarine; vegetable oils; unsalted salad dressings, regular salad dressings limited to 1 Tbs; light, sour and heavy cream Regular salad dressings containing bacon fat, bacon bits, and salt pork; snack dips made with instant soup mixes or processed cheese; salted nuts  Fruits Most fresh, frozen and canned fruits Fruits processed with salt or sodium-containing ingredient (some dried fruits are processed with sodium sulfites        Vegetables Fresh, frozen vegetables and low- sodium canned vegetables Regular canned vegetables, sauerkraut, pickled vegetables, and others prepared in brine; frozen vegetables in sauces; vegetables seasoned with ham, bacon or salt pork  Condiments, Sauces, Miscellaneous  Salt substitute with physician's approval; pepper, herbs, spices; vinegar, lemon or lime juice; hot pepper sauce; garlic powder, onion  powder, low sodium soy sauce (1 Tbs.); low sodium condiments (ketchup, chili sauce, mustard) in limited amounts (1 tsp.) fresh ground horseradish; unsalted tortilla chips, pretzels, potato chips, popcorn, salsa (1/4 cup) Any seasoning made with salt including garlic salt, celery salt, onion salt, and seasoned salt; sea salt, rock salt, kosher salt; meat tenderizers; monosodium glutamate; mustard, regular soy sauce, barbecue,  sauce, chili sauce, teriyaki sauce, steak sauce, Worcestershire sauce, and most flavored vinegars; canned gravy and mixes; regular condiments; salted snack foods, olives, picles, relish, horseradish sauce, catsup   Food preparation: Try these seasonings Meats:    Pork Sage, onion Serve with applesauce  Chicken Poultry seasoning, thyme, parsley Serve with cranberry sauce  Lamb Curry powder, rosemary, garlic, thyme Serve with mint sauce or jelly  Veal Marjoram, basil Serve with current jelly, cranberry sauce  Beef Pepper, bay leaf Serve with dry mustard, unsalted chive butter  Fish [place] leaf, dill Serve with unsalted lemon butter, unsalted parsley butter  Vegetables:    Asparagus Lemon juice   Broccoli Lemon juice   Carrots Mustard dressing parsley, mint, nutmeg, glazed with unsalted butter and sugar   Green beans Marjoram, lemon juice, nutmeg,dill seed   Tomatoes Basil, marjoram, onion   Spice /blend for [employer]" 4 tsp ground thyme 1 tsp ground sage 3 tsp ground rosemary 4 tsp ground marjoram   Test your knowledge 1. A product that says "Salt Free" may still contain sodium. True or False 2. Garlic Powder and Hot Pepper Sauce an be used as alternative seasonings.True or False 3. Processed foods have more sodium than fresh foods.  True or False 4. Canned Vegetables have less sodium than froze True or False  WAYS TO DECREASE YOUR SODIUM INTAKE 1. Avoid the use of added salt in cooking and at the table.  Table salt (and other prepared seasonings which contain salt) is  probably one of the greatest sources of sodium in the diet.  Unsalted foods can gain flavor from the sweet, sour, and butter taste sensations of herbs and spices.  Instead of using salt for seasoning, try the following seasonings with the foods listed.  Remember: how you use them to enhance natural food flavors is limited only by your creativity... Allspice-Meat, fish, eggs, fruit, peas, red and yellow vegetables Almond Extract-Fruit baked goods Anise Seed-Sweet breads, fruit, carrots, beets, cottage cheese, cookies (tastes like licorice) Basil-Meat, fish, eggs, vegetables, rice, vegetables salads, soups, sauces Bay Leaf-Meat, fish, stews, poultry Burnet-Salad, vegetables (cucumber-like flavor) Caraway Seed-Bread, cookies, cottage cheese, meat, vegetables, cheese, rice Cardamon-Baked goods, fruit, soups Celery Powder or seed-Salads, salad dressings, sauces, meatloaf, soup, bread.Do not use  celery salt Chervil-Meats, salads, fish, eggs, vegetables, cottage cheese (parsley-like flavor) Chili Power-Meatloaf, chicken cheese, corn, eggplant, egg dishes Chives-Salads cottage cheese, egg dishes, soups, vegetables, sauces Cilantro-Salsa, casseroles Cinnamon-Baked goods, fruit, pork, lamb, chicken, carrots Cloves-Fruit, baked goods, fish, pot roast, green beans, beets, carrots Coriander-Pastry, cookies, meat, salads, cheese (lemon-orange flavor) Cumin-Meatloaf, fish,cheese, eggs, cabbage,fruit pie (caraway flavor) [employer], fruit, eggs, fish, poultry, cottage cheese, vegetables Dill Seed-Meat, cottage cheese, poultry, vegetables, fish, salads, bread Fennel Seed-Bread, cookies, apples, pork, eggs, fish, beets, cabbage, cheese, Licorice-like flavor Garlic-(buds or powder) Salads, meat, poultry, fish, bread, butter, vegetables, potatoes.Do not  use garlic salt Ginger-Fruit, vegetables, baked goods, meat, fish, poultry Horseradish Root-Meet, vegetables, butter Lemon Juice or Extract-Vegetables,  fruit, tea, baked goods, fish salads Mace-Baked goods fruit, vegetables, fish, poultry (taste like nutmeg) Maple Extract-Syrups Marjoram-Meat, chicken, fish, vegetables, breads, green salads (taste like Sage) Mint-Tea, lamb, sherbet, vegetables, desserts, carrots, cabbage Mustard, Dry or Seed-Cheese, eggs, meats, vegetables, poultry Nutmeg-Baked goods, fruit, chicken, eggs, vegetables, desserts Onion Powder-Meat, fish, poultry, vegetables, cheese, eggs, bread, rice salads (Do not use   Onion salt) Orange Extract-Desserts, baked goods Oregano-Pasta, eggs, cheese, onions, pork, lamb, fish, chicken, vegetables, green salads Paprika-Meat, fish, poultry, eggs, cheese, vegetables Parsley Flakes-Butter, vegetables,  meat fish, poultry, eggs, bread, salads (certain forms may   Contain sodium Pepper-Meat fish, poultry, vegetables, eggs Peppermint Extract-Desserts, baked goods Poppy Seed-Eggs, bread, cheese, fruit dressings, baked goods, noodles, vegetables, cottage  [employer], poultry, meat, fish, cauliflower, turnips,eggs bread Saffron-Rice, bread, veal, chicken, fish, eggs Sage-Meat, fish, poultry, onions, eggplant, tomateos, pork, stews Savory-Eggs, salads, poultry, meat, rice, vegetables, soups, pork Tarragon-Meat, poultry, fish, eggs, butter, vegetables (licorice-like flavor)  Thyme-Meat, poultry, fish, eggs, vegetables, (clover-like flavor), sauces, soups Tumeric-Salads, butter, eggs, fish, rice, vegetables (saffron-like flavor) Vanilla Extract-Baked goods, candy Vinegar-Salads, vegetables, meat marinades Walnut Extract-baked goods, candy  2. Choose your Foods Wisely   The following is a list of foods to avoid which are high in sodium:  Meats-Avoid all smoked, canned, salt cured, dried and kosher meat and fish as well as Anchovies   [employer] meats:Bologna, Liverwurst, Pastrami Canned meat or fish  Marinated herring Caviar     Pepperoni Corned Beef   Pizza Dried chipped beef  Salami Frozen breaded fish or meat Salt pork Frankfurters or hot dogs  Sardines Gefilte fish   Sausage Ham (boiled ham, Proscuitto Smoked butt    spiced ham)   Spam      TV Dinners Vegetables Canned vegetables (Regular) Relish Canned mushrooms  Sauerkraut Olives    Tomato juice Pickles  Bakery and Dessert Products Canned puddings  Cream pies Cheesecake   Decorated cakes Cookies  Beverages/Juices Tomato juice, regular  Gatorade   V-8 vegetable juice, regular  Breads and Cereals Biscuit mixes   Salted potato chips, corn chips, pretzels Bread stuffing mixes  Salted crackers and rolls Pancake and waffle mixes Self-rising flour  Seasonings Accent    Meat sauces Barbecue sauce  Meat tenderizer Catsup    Monosodium glutamate (MSG) Celery salt   Onion salt Chili sauce   Prepared mustard Garlic salt   Salt, seasoned salt, sea salt Gravy mixes   Soy sauce Horseradish   Steak sauce Ketchup   Tartar sauce Lite salt    Teriyaki sauce Marinade mixes   Worcestershire sauce  Others Baking powder   Cocoa and cocoa mixes Baking soda   Commercial casserole mixes Candy-caramels, chocolate  Dehydrated soups    Bars, fudge,nougats  Instant rice and pasta mixes Canned broth or soup  Maraschino cherries Cheese, aged and processed cheese and cheese spreads  Learning Assessment Quiz  Indicated T (for True) or F (for False) for each of the following statements:  1. _____ Fresh fruits and vegetables and unprocessed grains are generally low in sodium 2. _____ Water may contain a considerable amount of sodium, depending on the source 3. _____ You can always tell if a food is high in sodium by tasting it 4. _____ Certain laxatives my be high in sodium and should be avoided unless prescribed   by a physician or pharmacist 5. _____ Salt substitutes may be used freely by anyone on a sodium restricted diet 6. _____ Sodium is present in table  salt, food additives and as a natural component of   most foods 7. _____ Table salt is approximately 90% sodium 8. _____ Limiting sodium intake may help prevent excess fluid accumulation in the body 9. _____ On a sodium-restricted diet, seasonings such as bouillon soy sauce, and    cooking wine should be used in place of table salt 10. _____ On an ingredient list, a product which lists monosodium glutamate as the first   ingredient is an appropriate food to  include on a low sodium diet  Circle the best answer(s) to the following statements (Hint: there may be more than one correct answer)  11. On a low-sodium diet, some acceptable snack items are:    A. Olives  F. Bean dip   K. Grapefruit juice    B. Salted Pretzels G. Commercial Popcorn   L. Canned peaches    C. Carrot Sticks  H. Bouillon   M. Unsalted nuts   D. [place] fries  I. Peanut butter crackers N. [place]   E. Sweet pickles J. Tomato Juice   O. Pizza  12.  Seasonings that may be used freely on a reduced - sodium diet include   A. Lemon wedges F.Monosodium glutamate K. Celery seed    B.Soysauce   G. Pepper   L. Mustard powder   C. Sea salt  H. Cooking wine  M. Onion flakes   D. Vinegar  E. Prepared horseradish N. Salsa   E. Sage   J. Worcestershire sauce  O. Chutney     Signed, Ermalinda Barrios, PA-C  10/10/2018 2:01 PM    [facility] [place], [place], [place]  [address] Phone: [phone]; Fax: [phone]

## 2018-10-11 LAB — BASIC METABOLIC PANEL
BUN/Creatinine Ratio: 31 — ABNORMAL HIGH (ref 12–28)
BUN: 40 mg/dL — ABNORMAL HIGH (ref 8–27)
CO2: 24 mmol/L (ref 20–29)
Calcium: 9.5 mg/dL (ref 8.7–10.3)
Chloride: 103 mmol/L (ref 96–106)
Creatinine, Ser: 1.27 mg/dL — ABNORMAL HIGH (ref 0.57–1.00)
GFR calc Af Amer: 43 mL/min/{1.73_m2} — ABNORMAL LOW (ref >59)
GFR calc non Af Amer: 38 mL/min/{1.73_m2} — ABNORMAL LOW (ref >59)
Glucose: 115 mg/dL — ABNORMAL HIGH (ref 65–99)
Potassium: 5.3 mmol/L — ABNORMAL HIGH (ref 3.5–5.2)
Sodium: 141 mmol/L (ref 134–144)

## 2018-10-11 LAB — CBC
Hematocrit: 35.2 % (ref 34.0–46.6)
Hemoglobin: 11.5 g/dL (ref 11.1–15.9)
MCH: 33.6 pg — ABNORMAL HIGH (ref 26.6–33.0)
MCHC: 32.7 g/dL (ref 31.5–35.7)
MCV: 103 fL — ABNORMAL HIGH (ref 79–97)
Platelets: 176 10*3/uL (ref 150–450)
RBC: 3.42 x10E6/uL — ABNORMAL LOW (ref 3.77–5.28)
RDW: 13.5 % (ref 11.7–15.4)
WBC: 9.4 10*3/uL (ref 3.4–10.8)

## 2018-10-12 NOTE — Progress Notes (Signed)
Cardiology Office Note Date:  10/13/2018  Patient ID:  Shaniece, Bussa [birth year], MRN [id number] PCP:  [name], MD  Cardiologist:  Dr. Meda Coffee Electrophysiologist: Dr. Lovena Le    Chief Complaint:  over due EP visit  History of Present Illness: AMORINA DOERR is a 85 y.o. female with history of chronic CHF (diastolic), sinus node dysfunction w/PPM, AFib, PVCs HTN, HLD, CKD (III), p.HTN, h/o pleural effusion requiring thora May 2019.  She comes today to be seen for Dr. Lovena Le, last seen by him May 2019.  AT that visit he noted she had 2 hours of AFib, not felt to be a great systemic a/c candidate though if had more would need revisiting/consideration for Eliquis  Most recently she saw M. Lenze,PA-C, 10/10/2018, this to f/u after a ER/observation after accidentally taking her grandson's meds.  She was doing ok, some LE swelling with noted intermittent salt liberalization.  She was planned for support stockings, counseled on low sodium diet and planned for labs BMET noted  K+ 5.3 (? If lab error and planned for repeat today) Creat 1.27 (about baseline) H/H stable  She is accompanied by her granddaughter, and doing ok outside of her terrible arthritis.  She has no noew worries or complaints since her visit with Gerrianne Scale, PA.  Device information BSCi dual chamber PPM, implanted 07/01/16   Past Medical History:  Diagnosis Date   Anemia    "I stay anemic"   Arthritis    Asthma    no problems recent   Cataract    Chronic diastolic CHF (congestive heart failure), NYHA class 2 ([facility]) 03/19/2014   Echo 4/19: mod LVH, EF 55-60, no RWMA, Gr 1 DD, MAC, trivial MR, mod LAE, normal RVSF, PASP 19   CKD (chronic kidney disease), stage III (HCC)    Complication of anesthesia    CONFUSED AFTER KNEE SURGERY   Depression    Diverticulitis    DVT (deep venous thrombosis) (HCC)    from birth control, left groin   Essential hypertension    Fibromyalgia 10 y.a.    GERD (gastroesophageal reflux disease)    Gout    Hx of cardiovascular stress test    Lexiscan Myoview (8/15): apical attenuation artifact, no ischemia, EF 69% - low risk.   Hypercholesteremia    Odynophagia    Other abnormal glucose    PAF (paroxysmal atrial fibrillation) (HCC)    Personal history of other diseases of digestive system    Pneumonia    hx of   Presence of permanent cardiac pacemaker    Sinus bradycardia    Skin cancer 2014   skin R elbow & face   Symptomatic bradycardia 06/2016    Past Surgical History:  Procedure Laterality Date   ABDOMINAL HYSTERECTOMY     partial   APPENDECTOMY  ~55years ago   CHOLECYSTECTOMY  10/2005   Dr. Effie Shy   COLONOSCOPY W/ BIOPSIES  2005, 2011   Dr. Bing Plume, "balloon inside for last one at [facility]"   INTRAMEDULLARY (IM) NAIL INTERTROCHANTERIC Left 02/10/2018   Procedure: INTRAMEDULLARY (IM) NAIL INTERTROCHANTRIC;  Surgeon: Rod Can, MD;  Location: [facility];  Service: Orthopedics;  Laterality: Left;   IR THORACENTESIS ASP PLEURAL SPACE W/IMG GUIDE  06/13/2017   KNEE ARTHROSCOPY Right    LAMINECTOMY  05/2007   foraminotomy, L4-5 laminectomy   LARYNGOSCOPY / BRONCHOSCOPY / ESOPHAGOSCOPY  2010   Dr. Benjamine Mola   PACEMAKER IMPLANT N/A 07/01/2016   Procedure: Pacemaker Implant;  Surgeon:  Evans Lance, MD;  Location: [facility];  Service: Cardiovascular;  Laterality: N/A;   SKIN CANCER EXCISION Right ~2005   r elbow   TOTAL KNEE ARTHROPLASTY Left 04/04/2013   Procedure: LEFT TOTAL KNEE ARTHROPLASTY;  Surgeon: Tobi Bastos, MD;  Location: [facility];  Service: Orthopedics;  Laterality: Left;   TUBAL LIGATION     TUMOR REMOVAL Right ~ 20 years ago   tumor in between toes    Current Outpatient Medications  Medication Sig Dispense Refill   acetaminophen (TYLENOL) 500 MG tablet Take 2 tablets (1,000 mg total) by mouth every 8 (eight) hours as needed for mild pain or headache. (Patient taking  differently: Take 500 mg by mouth every 8 (eight) hours as needed for mild pain or headache. )     albuterol (PROVENTIL HFA;VENTOLIN HFA) 108 (90 BASE) MCG/ACT inhaler Inhale 1 puff into the lungs every 6 (six) hours as needed for wheezing or shortness of breath. 1 Inhaler 3   alendronate (FOSAMAX) 70 MG tablet Take 70 mg by mouth once a week. On Friday     allopurinol (ZYLOPRIM) 100 MG tablet Take 1 tablet (100 mg total) by mouth daily. 30 tablet 6   amLODipine (NORVASC) 2.5 MG tablet Take 1 tablet (2.5 mg total) by mouth daily. Please make overdue appt with Dr. Meda Coffee before anymore refills. 1st attempt 30 tablet 0   aspirin EC 81 MG tablet Take 81 mg by mouth daily.      budesonide-formoterol (SYMBICORT) 80-4.5 MCG/ACT inhaler Inhale 2 puffs into the lungs 2 (two) times daily. 10.2 g 5   famotidine (PEPCID) 20 MG tablet Take 20 mg by mouth 2 (two) times daily.     ferrous sulfate 325 (65 FE) MG tablet Take 325 mg by mouth daily.      methocarbamol (ROBAXIN) 500 MG tablet Take 500 mg by mouth 2 (two) times daily as needed for muscle spasms.     montelukast (SINGULAIR) 10 MG tablet Take 10 mg by mouth at bedtime.       ondansetron (ZOFRAN) 4 MG tablet Take 4 mg by mouth every 8 (eight) hours as needed for nausea or vomiting.     pravastatin (PRAVACHOL) 20 MG tablet Take 20 mg by mouth at bedtime.      QUEtiapine (SEROQUEL) 25 MG tablet Take 1 tablet (25 mg total) by mouth at bedtime. (Patient taking differently: Take 25 mg by mouth daily. ) 30 tablet 0   sertraline (ZOLOFT) 50 MG tablet Take 50 mg by mouth daily.      No current facility-administered medications for this visit.     Allergies:   Amoxicillin, Diltiazem, Diltiazem hcl, Losartan potassium, Potassium-containing compounds, Sulfonamide derivatives, Zetia [ezetimibe], and Penicillins   Social History:  The patient  reports that she quit smoking about 45 years ago. Her smoking use included cigarettes. She quit after 10.00  years of use. She has never used smokeless tobacco. She reports that she does not drink alcohol or use drugs.   Family History:  The patient's family history includes Arrhythmia in her mother; Asthma in her sister; Cancer in her brother and sister; Diabetes in her father and sister; Fainting in her mother; Heart attack in her brother; Heart disease in her mother; Hypertension in her father and mother; Stroke in her father; Sudden death in her brother.  ROS:  Please see the history of present illness.  All other systems are reviewed and otherwise negative.   PHYSICAL EXAM:  VS:  BP 132/66    Pulse 60    Ht _0  (1.6 m)    Wt 158 lb (71.7 kg)    BMI 27.99 kg/m  BMI: Body mass index is 27.99 kg/m. Well nourished, well developed, in no acute distress  HEENT: normocephalic, atraumatic  Neck: no JVD, carotid bruits or masses Cardiac:  RRR; no significant murmurs, no rubs, or gallops Lungs:  CTA b/l, no wheezing, rhonchi or rales  Abd: soft, nontender MS: no deformity, age appropriate atrophy Ext:  no edema  Skin: warm and dry, no rash Neuro:  No gross deficits appreciated Psych: euthymic mood, full affect  PPM site is stable, no tethering or discomfort   EKG:  Not done today PPM interrogation done today and reviewed by myself:  Battery and lead measurements are good.  ATR events are very brief an AT, no Aib is noted   Echo 05/26/17 Study Conclusions - Left ventricle: The cavity size was normal. Wall thickness was increased in a pattern of moderate LVH. Systolic function was normal. The estimated ejection fraction was in the range of 55% to 60%. Wall motion was normal; there were no regional wall motion abnormalities. Doppler parameters are consistent with abnormal left ventricular relaxation (grade 1 diastolic dysfunction). - Aortic valve: There was no stenosis. - Mitral valve: Mildly calcified annulus. There was trivial regurgitation. - Left atrium: The atrium was  moderately dilated. - Right ventricle: The cavity size was normal. Pacer wire or catheter noted in right ventricle. Systolic function was normal. - Tricuspid valve: Peak RV-RA gradient (S): 16 mm Hg. - Pulmonary arteries: PA peak pressure: 19 mm Hg (S). - Inferior vena cava: The vessel was normal in size. The respirophasic diameter changes were in the normal range (>= 50%), consistent with normal central venous pressure.  Impressions: - Normal LV size with moderate LV hypertrophy. EF 55-60%. Normal RV size and systolic function. No significant valvular abnormalities.  Recent Labs: 08/17/2018: ALT 14; TSH 3.078 10/10/2018: BUN 40; Creatinine, Ser 1.27; Hemoglobin 11.5; Platelets 176; Potassium 5.3; Sodium 141  No results found for requested labs within last 8760 hours.   Estimated Creatinine Clearance: 28.5 mL/min (A) (by C-G formula based on SCr of 1.27 mg/dL (H)).   Wt Readings from Last 3 Encounters:  10/13/18 158 lb (71.7 kg)  10/10/18 162 lb 1.9 oz (73.5 kg)  02/10/18 163 lb 2.3 oz (74 kg)     Other studies reviewed: Additional studies/records reviewed today include: summarized above  ASSESSMENT AND PLAN:  1. PPM     Intact function, no programming changes made  2. Paroxysmal AFib    Noted remotely via device (SCAF)    None further  3. HTN     Looks OK, no cahnges made  4. Chronic diastolic HF      Deferred to gen cards team      Will repeat her BMET today as requested    Disposition: F/u with Q 3 month remotes and in-clinic in one year, sooner if needed  Current medicines are reviewed at length with the patient today.  The patient did not have any concerns regarding medicines.  Venetia Night, PA-C 10/13/2018 1:36 PM     [address]  [place] [address] [phone] (office)  [phone] (fax)

## 2018-10-13 ENCOUNTER — Other Ambulatory Visit: Payer: Self-pay | Admitting: Physician Assistant

## 2018-10-13 ENCOUNTER — Encounter: Payer: Self-pay | Admitting: Physician Assistant

## 2018-10-13 ENCOUNTER — Encounter: Payer: Self-pay | Admitting: Cardiology

## 2018-10-13 ENCOUNTER — Ambulatory Visit (INDEPENDENT_AMBULATORY_CARE_PROVIDER_SITE_OTHER): Payer: Medicare HMO | Admitting: Physician Assistant

## 2018-10-13 ENCOUNTER — Other Ambulatory Visit: Payer: Self-pay

## 2018-10-13 VITALS — BP 132/66 | HR 60 | Ht 63.0 in | Wt 158.0 lb

## 2018-10-13 DIAGNOSIS — M6281 Muscle weakness (generalized): Secondary | ICD-10-CM | POA: Diagnosis not present

## 2018-10-13 DIAGNOSIS — Z950 Presence of cardiac pacemaker: Secondary | ICD-10-CM

## 2018-10-13 DIAGNOSIS — I5032 Chronic diastolic (congestive) heart failure: Secondary | ICD-10-CM

## 2018-10-13 DIAGNOSIS — I480 Paroxysmal atrial fibrillation: Secondary | ICD-10-CM

## 2018-10-13 DIAGNOSIS — S72442S Displaced fracture of lower epiphysis (separation) of left femur, sequela: Secondary | ICD-10-CM | POA: Diagnosis not present

## 2018-10-13 DIAGNOSIS — E875 Hyperkalemia: Secondary | ICD-10-CM | POA: Diagnosis not present

## 2018-10-13 DIAGNOSIS — J45909 Unspecified asthma, uncomplicated: Secondary | ICD-10-CM | POA: Diagnosis not present

## 2018-10-13 DIAGNOSIS — Z4789 Encounter for other orthopedic aftercare: Secondary | ICD-10-CM | POA: Diagnosis not present

## 2018-10-13 LAB — BASIC METABOLIC PANEL
BUN/Creatinine Ratio: 23 (ref 12–28)
BUN: 37 mg/dL — ABNORMAL HIGH (ref 8–27)
CO2: 21 mmol/L (ref 20–29)
Calcium: 9.5 mg/dL (ref 8.7–10.3)
Chloride: 102 mmol/L (ref 96–106)
Creatinine, Ser: 1.6 mg/dL — ABNORMAL HIGH (ref 0.57–1.00)
GFR calc Af Amer: 33 mL/min/{1.73_m2} — ABNORMAL LOW (ref >59)
GFR calc non Af Amer: 28 mL/min/{1.73_m2} — ABNORMAL LOW (ref >59)
Glucose: 99 mg/dL (ref 65–99)
Potassium: 5.4 mmol/L — ABNORMAL HIGH (ref 3.5–5.2)
Sodium: 139 mmol/L (ref 134–144)

## 2018-10-13 NOTE — Progress Notes (Signed)
F/u on Friday labs renal insuff and hyperkalemia

## 2018-10-13 NOTE — Patient Instructions (Signed)
Medication Instructions:  Your physician recommends that you continue on your current medications as directed. Please refer to the Current Medication list given to you today.   If you need a refill on your cardiac medications before your next appointment, please call your pharmacy.   Lab work: BMET   If you have labs (blood work) drawn today and your tests are completely normal, you will receive your results only by: Marland Kitchen MyChart Message (if you have MyChart) OR . A paper copy in the mail If you have any lab test that is abnormal or we need to change your treatment, we will call you to review the results.  Testing/Procedures: NONE ORDERED  TODAY   Follow-Up: At [facility], you and your health needs are our priority.  As part of our continuing mission to provide you with exceptional heart care, we have created designated Provider Care Teams.  These Care Teams include your primary Cardiologist (physician) and Advanced Practice Providers (APPs -  Physician Assistants and Nurse Practitioners) who all work together to provide you with the care you need, when you need it. You will need a follow up appointment in 1 years.  Please call our office 2 months in advance to schedule this appointment.  You may see Cristopher Peru, MD or one of the following Advanced Practice Providers on your designated Care Team:   Chanetta Marshall, NP . Tommye Standard, PA-C  Any Other Special Instructions Will Be Listed Below (If Applicable).

## 2018-10-13 NOTE — Progress Notes (Signed)
Remote pacemaker transmission.   

## 2018-10-17 ENCOUNTER — Other Ambulatory Visit: Payer: Self-pay

## 2018-10-17 ENCOUNTER — Telehealth: Payer: Self-pay | Admitting: Cardiology

## 2018-10-17 ENCOUNTER — Other Ambulatory Visit: Payer: Medicare HMO

## 2018-10-17 DIAGNOSIS — I5032 Chronic diastolic (congestive) heart failure: Secondary | ICD-10-CM

## 2018-10-17 DIAGNOSIS — E875 Hyperkalemia: Secondary | ICD-10-CM

## 2018-10-17 NOTE — Telephone Encounter (Signed)
New message:    Patient grand daughter calling and would like for some one to call her concerning her appt today for labs. She states that she is the only one who can bring her because the patient can't walk and she has a toddler. She like to know if some one can come to the car to get labs. Please call back.

## 2018-10-18 LAB — BASIC METABOLIC PANEL
BUN/Creatinine Ratio: 28 (ref 12–28)
BUN: 38 mg/dL — ABNORMAL HIGH (ref 8–27)
CO2: 20 mmol/L (ref 20–29)
Calcium: 9.4 mg/dL (ref 8.7–10.3)
Chloride: 105 mmol/L (ref 96–106)
Creatinine, Ser: 1.38 mg/dL — ABNORMAL HIGH (ref 0.57–1.00)
GFR calc Af Amer: 39 mL/min/{1.73_m2} — ABNORMAL LOW (ref >59)
GFR calc non Af Amer: 34 mL/min/{1.73_m2} — ABNORMAL LOW (ref >59)
Glucose: 98 mg/dL (ref 65–99)
Potassium: 5.4 mmol/L — ABNORMAL HIGH (ref 3.5–5.2)
Sodium: 141 mmol/L (ref 134–144)

## 2018-10-24 ENCOUNTER — Other Ambulatory Visit: Payer: Self-pay | Admitting: *Deleted

## 2018-10-24 DIAGNOSIS — I5032 Chronic diastolic (congestive) heart failure: Secondary | ICD-10-CM

## 2018-10-25 ENCOUNTER — Other Ambulatory Visit: Payer: Medicare HMO

## 2018-10-27 ENCOUNTER — Other Ambulatory Visit: Payer: Self-pay

## 2018-10-27 ENCOUNTER — Other Ambulatory Visit: Payer: Medicare HMO | Admitting: *Deleted

## 2018-10-27 DIAGNOSIS — I5032 Chronic diastolic (congestive) heart failure: Secondary | ICD-10-CM | POA: Diagnosis not present

## 2018-10-28 LAB — BASIC METABOLIC PANEL
BUN/Creatinine Ratio: 19 (ref 12–28)
BUN: 25 mg/dL (ref 8–27)
CO2: 22 mmol/L (ref 20–29)
Calcium: 8.7 mg/dL (ref 8.7–10.3)
Chloride: 104 mmol/L (ref 96–106)
Creatinine, Ser: 1.33 mg/dL — ABNORMAL HIGH (ref 0.57–1.00)
GFR calc Af Amer: 41 mL/min/{1.73_m2} — ABNORMAL LOW (ref >59)
GFR calc non Af Amer: 35 mL/min/{1.73_m2} — ABNORMAL LOW (ref >59)
Glucose: 124 mg/dL — ABNORMAL HIGH (ref 65–99)
Potassium: 4.7 mmol/L (ref 3.5–5.2)
Sodium: 142 mmol/L (ref 134–144)

## 2018-10-31 DIAGNOSIS — I129 Hypertensive chronic kidney disease with stage 1 through stage 4 chronic kidney disease, or unspecified chronic kidney disease: Secondary | ICD-10-CM | POA: Diagnosis not present

## 2018-10-31 DIAGNOSIS — Z6831 Body mass index (BMI) 31.0-31.9, adult: Secondary | ICD-10-CM | POA: Diagnosis not present

## 2018-10-31 DIAGNOSIS — N179 Acute kidney failure, unspecified: Secondary | ICD-10-CM | POA: Diagnosis not present

## 2018-10-31 DIAGNOSIS — Z23 Encounter for immunization: Secondary | ICD-10-CM | POA: Diagnosis not present

## 2018-10-31 DIAGNOSIS — E875 Hyperkalemia: Secondary | ICD-10-CM | POA: Diagnosis not present

## 2018-10-31 DIAGNOSIS — I5030 Unspecified diastolic (congestive) heart failure: Secondary | ICD-10-CM | POA: Diagnosis not present

## 2018-10-31 DIAGNOSIS — N183 Chronic kidney disease, stage 3 (moderate): Secondary | ICD-10-CM | POA: Diagnosis not present

## 2018-11-12 DIAGNOSIS — M6281 Muscle weakness (generalized): Secondary | ICD-10-CM | POA: Diagnosis not present

## 2018-11-12 DIAGNOSIS — J45909 Unspecified asthma, uncomplicated: Secondary | ICD-10-CM | POA: Diagnosis not present

## 2018-11-12 DIAGNOSIS — S72442S Displaced fracture of lower epiphysis (separation) of left femur, sequela: Secondary | ICD-10-CM | POA: Diagnosis not present

## 2018-11-12 DIAGNOSIS — Z4789 Encounter for other orthopedic aftercare: Secondary | ICD-10-CM | POA: Diagnosis not present

## 2018-12-13 DIAGNOSIS — M6281 Muscle weakness (generalized): Secondary | ICD-10-CM | POA: Diagnosis not present

## 2018-12-13 DIAGNOSIS — S72442S Displaced fracture of lower epiphysis (separation) of left femur, sequela: Secondary | ICD-10-CM | POA: Diagnosis not present

## 2018-12-13 DIAGNOSIS — Z4789 Encounter for other orthopedic aftercare: Secondary | ICD-10-CM | POA: Diagnosis not present

## 2018-12-13 DIAGNOSIS — J45909 Unspecified asthma, uncomplicated: Secondary | ICD-10-CM | POA: Diagnosis not present

## 2019-01-12 DIAGNOSIS — J45909 Unspecified asthma, uncomplicated: Secondary | ICD-10-CM | POA: Diagnosis not present

## 2019-01-12 DIAGNOSIS — Z4789 Encounter for other orthopedic aftercare: Secondary | ICD-10-CM | POA: Diagnosis not present

## 2019-01-12 DIAGNOSIS — M6281 Muscle weakness (generalized): Secondary | ICD-10-CM | POA: Diagnosis not present

## 2019-01-12 DIAGNOSIS — S72442S Displaced fracture of lower epiphysis (separation) of left femur, sequela: Secondary | ICD-10-CM | POA: Diagnosis not present

## 2019-02-12 DIAGNOSIS — S72442S Displaced fracture of lower epiphysis (separation) of left femur, sequela: Secondary | ICD-10-CM | POA: Diagnosis not present

## 2019-02-12 DIAGNOSIS — M6281 Muscle weakness (generalized): Secondary | ICD-10-CM | POA: Diagnosis not present

## 2019-02-12 DIAGNOSIS — Z4789 Encounter for other orthopedic aftercare: Secondary | ICD-10-CM | POA: Diagnosis not present

## 2019-02-12 DIAGNOSIS — J45909 Unspecified asthma, uncomplicated: Secondary | ICD-10-CM | POA: Diagnosis not present

## 2019-02-14 DIAGNOSIS — R69 Illness, unspecified: Secondary | ICD-10-CM | POA: Diagnosis not present

## 2019-03-13 DIAGNOSIS — S72142D Displaced intertrochanteric fracture of left femur, subsequent encounter for closed fracture with routine healing: Secondary | ICD-10-CM | POA: Diagnosis not present

## 2019-04-04 ENCOUNTER — Ambulatory Visit (INDEPENDENT_AMBULATORY_CARE_PROVIDER_SITE_OTHER): Payer: Medicare HMO | Admitting: *Deleted

## 2019-04-04 DIAGNOSIS — Z950 Presence of cardiac pacemaker: Secondary | ICD-10-CM

## 2019-04-04 LAB — CUP PACEART REMOTE DEVICE CHECK
Battery Remaining Longevity: 84 mo
Battery Remaining Percentage: 100 %
Brady Statistic RA Percent Paced: 69 %
Brady Statistic RV Percent Paced: 0 %
Date Time Interrogation Session: 20210224034300
Implantable Lead Implant Date: 20180524
Implantable Lead Implant Date: 20180524
Implantable Lead Location: 753859
Implantable Lead Location: 753860
Implantable Lead Model: 7740
Implantable Lead Model: 7741
Implantable Lead Serial Number: 693708
Implantable Lead Serial Number: 861246
Implantable Pulse Generator Implant Date: 20180524
Lead Channel Impedance Value: 558 Ohm
Lead Channel Impedance Value: 710 Ohm
Lead Channel Pacing Threshold Amplitude: 0.4 V
Lead Channel Pacing Threshold Amplitude: 1 V
Lead Channel Pacing Threshold Pulse Width: 0.4 ms
Lead Channel Pacing Threshold Pulse Width: 0.4 ms
Lead Channel Setting Pacing Amplitude: 2 V
Lead Channel Setting Pacing Amplitude: 2.4 V
Lead Channel Setting Pacing Pulse Width: 0.4 ms
Lead Channel Setting Sensing Sensitivity: 2 mV
Pulse Gen Serial Number: 308044

## 2019-04-04 NOTE — Progress Notes (Signed)
PPM Remote  

## 2019-04-07 DIAGNOSIS — Z23 Encounter for immunization: Secondary | ICD-10-CM | POA: Diagnosis not present

## 2019-04-13 DIAGNOSIS — S20211A Contusion of right front wall of thorax, initial encounter: Secondary | ICD-10-CM | POA: Diagnosis not present

## 2019-04-13 DIAGNOSIS — R0781 Pleurodynia: Secondary | ICD-10-CM | POA: Diagnosis not present

## 2019-04-13 DIAGNOSIS — S299XXA Unspecified injury of thorax, initial encounter: Secondary | ICD-10-CM | POA: Diagnosis not present

## 2019-04-19 DIAGNOSIS — E782 Mixed hyperlipidemia: Secondary | ICD-10-CM | POA: Diagnosis not present

## 2019-04-19 DIAGNOSIS — I4891 Unspecified atrial fibrillation: Secondary | ICD-10-CM | POA: Diagnosis not present

## 2019-04-19 DIAGNOSIS — I129 Hypertensive chronic kidney disease with stage 1 through stage 4 chronic kidney disease, or unspecified chronic kidney disease: Secondary | ICD-10-CM | POA: Diagnosis not present

## 2019-04-19 DIAGNOSIS — D649 Anemia, unspecified: Secondary | ICD-10-CM | POA: Diagnosis not present

## 2019-04-19 DIAGNOSIS — J411 Mucopurulent chronic bronchitis: Secondary | ICD-10-CM | POA: Diagnosis not present

## 2019-04-19 DIAGNOSIS — I5032 Chronic diastolic (congestive) heart failure: Secondary | ICD-10-CM | POA: Diagnosis not present

## 2019-04-19 DIAGNOSIS — M1990 Unspecified osteoarthritis, unspecified site: Secondary | ICD-10-CM | POA: Diagnosis not present

## 2019-04-19 DIAGNOSIS — I10 Essential (primary) hypertension: Secondary | ICD-10-CM | POA: Diagnosis not present

## 2019-04-19 DIAGNOSIS — R69 Illness, unspecified: Secondary | ICD-10-CM | POA: Diagnosis not present

## 2019-04-20 DIAGNOSIS — W19XXXS Unspecified fall, sequela: Secondary | ICD-10-CM | POA: Diagnosis not present

## 2019-04-20 DIAGNOSIS — I10 Essential (primary) hypertension: Secondary | ICD-10-CM | POA: Diagnosis not present

## 2019-04-20 DIAGNOSIS — R0781 Pleurodynia: Secondary | ICD-10-CM | POA: Diagnosis not present

## 2019-04-20 DIAGNOSIS — I129 Hypertensive chronic kidney disease with stage 1 through stage 4 chronic kidney disease, or unspecified chronic kidney disease: Secondary | ICD-10-CM | POA: Diagnosis not present

## 2019-04-24 NOTE — Progress Notes (Signed)
Cardiology Office Note    Date:  04/25/2019   ID:  [name], DOB 1927-04-05, MRN [id number]  PCP:  [name], MD  Cardiologist: Ena Dawley, MD EPS: Cristopher Peru, MD  Chief Complaint  Patient presents with  . Follow-up    History of Present Illness:  [name] is a 86 y.o. female with history of chronic CHF (diastolic), sinus node dysfunction w/PPM, PAF , PVCs HTN, HLD, CKD (III), mild pulmonaryHTN, h/o pleural effusion requiring thora May 2019.   Was last seen in our office 10/13/2018 pacemaker follow-up and was doing well.  She has had 2 hours of atrial fib in the past but not felt to be a great systemic anticoagulation candidate though it would be considered if she had recurrence per Dr. Lovena Le.  Last device check 04/04/2019 with stable.  Patient comes in for f/u accompanied by her grand daughter who takes care of her and her autistic son who I'm seeing as well.Patient had a mechanical fall out of bed and went to walk in clinic and nothing is broken but she did have some bleeding on her arm and is still sore. Deep bruising on her ribs. Breathing is fine with the inhaler and using it more. Says her heart is fluttering more since then. No chest pain or edema. Occasional dizziness with getting up and sleepy since taking tramadol for pain.  Past Medical History:  Diagnosis Date  . Anemia    "I stay anemic"  . Arthritis   . Asthma    no problems recent  . Cataract   . Chronic diastolic CHF (congestive heart failure), NYHA class 2 ([place]) 03/19/2014   Echo 4/19: mod LVH, EF 55-60, no RWMA, Gr 1 DD, MAC, trivial MR, mod LAE, normal RVSF, PASP 19  . CKD (chronic kidney disease), stage III   . Complication of anesthesia    CONFUSED AFTER KNEE SURGERY  . Depression   . Diverticulitis   . DVT (deep venous thrombosis) ([place])    from birth control, left groin  . Essential hypertension   . Fibromyalgia 10 y.a.  . GERD (gastroesophageal reflux disease)   .  Gout   . Hx of cardiovascular stress test    Lexiscan Myoview (8/15): apical attenuation artifact, no ischemia, EF 69% - low risk.  Marland Kitchen Hypercholesteremia   . Odynophagia   . Other abnormal glucose   . PAF (paroxysmal atrial fibrillation) ([place])   . Personal history of other diseases of digestive system   . Pneumonia    hx of  . Presence of permanent cardiac pacemaker   . Sinus bradycardia   . Skin cancer 2014   skin R elbow & face  . Symptomatic bradycardia 06/2016    Past Surgical History:  Procedure Laterality Date  . ABDOMINAL HYSTERECTOMY     partial  . APPENDECTOMY  ~55years ago  . CHOLECYSTECTOMY  10/2005   Dr. Effie Shy  . COLONOSCOPY W/ BIOPSIES  2005, 2011   Dr. Bing Plume, "balloon inside for last one at [facility]"  . INTRAMEDULLARY (IM) NAIL INTERTROCHANTERIC Left 02/10/2018   Procedure: INTRAMEDULLARY (IM) NAIL INTERTROCHANTRIC;  Surgeon: Rod Can, MD;  Location: [place];  Service: Orthopedics;  Laterality: Left;  . IR THORACENTESIS ASP PLEURAL SPACE W/IMG GUIDE  06/13/2017  . KNEE ARTHROSCOPY Right   . LAMINECTOMY  05/2007   foraminotomy, L4-5 laminectomy  . LARYNGOSCOPY / BRONCHOSCOPY / ESOPHAGOSCOPY  2010   Dr. Benjamine Mola  . PACEMAKER IMPLANT N/A 07/01/2016  Procedure: Pacemaker Implant;  Surgeon: Evans Lance, MD;  Location: [facility];  Service: Cardiovascular;  Laterality: N/A;  . SKIN CANCER EXCISION Right ~2005   r elbow  . TOTAL KNEE ARTHROPLASTY Left 04/04/2013   Procedure: LEFT TOTAL KNEE ARTHROPLASTY;  Surgeon: Tobi Bastos, MD;  Location: [facility];  Service: Orthopedics;  Laterality: Left;  . TUBAL LIGATION    . TUMOR REMOVAL Right ~ 20 years ago   tumor in between toes    Current Medications: Current Meds  Medication Sig  . acetaminophen (TYLENOL) 500 MG tablet Take 2 tablets (1,000 mg total) by mouth every 8 (eight) hours as needed for mild pain or headache. (Patient taking differently: Take 500 mg by mouth every 8 (eight) hours as  needed for mild pain or headache. )  . albuterol (PROVENTIL HFA;VENTOLIN HFA) 108 (90 BASE) MCG/ACT inhaler Inhale 1 puff into the lungs every 6 (six) hours as needed for wheezing or shortness of breath.  Marland Kitchen alendronate (FOSAMAX) 70 MG tablet Take 70 mg by mouth once a week. On Friday  . allopurinol (ZYLOPRIM) 100 MG tablet Take 1 tablet (100 mg total) by mouth daily.  Marland Kitchen amLODipine (NORVASC) 2.5 MG tablet Take 1 tablet (2.5 mg total) by mouth daily. Please make overdue appt with Dr. Meda Coffee before anymore refills. 1st attempt  . aspirin EC 81 MG tablet Take 81 mg by mouth daily.   . budesonide-formoterol (SYMBICORT) 80-4.5 MCG/ACT inhaler Inhale 2 puffs into the lungs 2 (two) times daily.  . famotidine (PEPCID) 20 MG tablet Take 20 mg by mouth 2 (two) times daily.  . ferrous sulfate 325 (65 FE) MG tablet Take 325 mg by mouth daily.   . methocarbamol (ROBAXIN) 500 MG tablet Take 500 mg by mouth 2 (two) times daily as needed for muscle spasms.  . ondansetron (ZOFRAN) 4 MG tablet Take 4 mg by mouth every 8 (eight) hours as needed for nausea or vomiting.  . ondansetron (ZOFRAN-ODT) 8 MG disintegrating tablet Take 8 mg by mouth every 8 (eight) hours as needed.  . pravastatin (PRAVACHOL) 20 MG tablet Take 20 mg by mouth at bedtime.   . sertraline (ZOLOFT) 50 MG tablet Take 50 mg by mouth daily.   . [DISCONTINUED] montelukast (SINGULAIR) 10 MG tablet Take 10 mg by mouth at bedtime.    . [DISCONTINUED] QUEtiapine (SEROQUEL) 25 MG tablet Take 1 tablet (25 mg total) by mouth at bedtime. (Patient taking differently: Take 25 mg by mouth daily. )     Allergies:   Amoxicillin, Diltiazem, Diltiazem hcl, Losartan potassium, Potassium-containing compounds, Sulfonamide derivatives, Zetia [ezetimibe], and Penicillins   Social History   Socioeconomic History  . Marital status: Widowed    Spouse name: Not on file  . Number of children: Not on file  . Years of education: Not on file  . Highest education level:  Not on file  Occupational History  . Not on file  Tobacco Use  . Smoking status: Former Smoker    Years: 10.00    Types: Cigarettes    Quit date: 02/08/1973    Years since quitting: 46.2  . Smokeless tobacco: Never Used  Substance and Sexual Activity  . Alcohol use: No  . Drug use: No  . Sexual activity: Not Currently  Other Topics Concern  . Not on file  Social History Narrative  . Not on file   Social Determinants of Health   Financial Resource Strain:   . Difficulty of Paying Living Expenses:  Food Insecurity:   . Worried About Charity fundraiser in the Last Year:   . Arboriculturist in the Last Year:   Transportation Needs:   . Film/video editor (Medical):   Marland Kitchen Lack of Transportation (Non-Medical):   Physical Activity:   . Days of Exercise per Week:   . Minutes of Exercise per Session:   Stress:   . Feeling of Stress :   Social Connections:   . Frequency of Communication with Friends and Family:   . Frequency of Social Gatherings with Friends and Family:   . Attends Religious Services:   . Active Member of Clubs or Organizations:   . Attends Archivist Meetings:   Marland Kitchen Marital Status:      Family History:  The patient's   family history includes Arrhythmia in her mother; Asthma in her sister; Cancer in her brother and sister; Diabetes in her father and sister; Fainting in her mother; Heart attack in her brother; Heart disease in her mother; Hypertension in her father and mother; Stroke in her father; Sudden death in her brother.   ROS:   Please see the history of present illness.    ROS All other systems reviewed and are negative.   PHYSICAL EXAM:   VS:  BP 110/62   Pulse 69   Ht _0  (1.6 m)   Wt 164 lb (74.4 kg)   SpO2 94%   BMI 29.05 kg/m   Physical Exam  GEN: Well nourished, well developed, in no acute distress  Neck: no JVD, carotid bruits, or masses Cardiac:RRR; no murmurs, rubs, or gallops  Respiratory:  clear to auscultation  bilaterally, normal work of breathing GI: soft, nontender, nondistended, + BS Ext: without cyanosis, clubbing, or edema, Good distal pulses bilaterally Neuro:  Alert and Oriented x 3 Psych: euthymic mood, full affect  Wt Readings from Last 3 Encounters:  04/25/19 164 lb (74.4 kg)  10/13/18 158 lb (71.7 kg)  10/10/18 162 lb 1.9 oz (73.5 kg)      Studies/Labs Reviewed:   EKG:  EKG is  ordered today.   Recent Labs: 08/17/2018: ALT 14; TSH 3.078 10/10/2018: Hemoglobin 11.5; Platelets 176 10/27/2018: BUN 25; Creatinine, Ser 1.33; Potassium 4.7; Sodium 142   Lipid Panel    Component Value Date/Time   CHOL  06/07/2008 0405    118        ATP III CLASSIFICATION:  <200     mg/dL   Desirable  200-239  mg/dL   Borderline High  >=240    mg/dL   High          TRIG 85 06/07/2008 0405   HDL 35 (L) 06/07/2008 0405   CHOLHDL 3.4 06/07/2008 0405   VLDL 17 06/07/2008 0405   LDLCALC  06/07/2008 0405    66        Total Cholesterol/HDL:CHD Risk Coronary Heart Disease Risk Table                     Men   Women  1/2 Average Risk   3.4   3.3  Average Risk       5.0   4.4  2 X Average Risk   9.6   7.1  3 X Average Risk  23.4   11.0        Use the calculated Patient Ratio above and the CHD Risk Table to determine the patient's CHD Risk.        ATP  III CLASSIFICATION (LDL):  <100     mg/dL   Optimal  100-129  mg/dL   Near or Above                    Optimal  130-159  mg/dL   Borderline  160-189  mg/dL   High  >190     mg/dL   Very High    Additional studies/ records that were reviewed today include:  Echo 05/26/17 Study Conclusions  - Left ventricle: The cavity size was normal. Wall thickness was   increased in a pattern of moderate LVH. Systolic function was   normal. The estimated ejection fraction was in the range of 55%   to 60%. Wall motion was normal; there were no regional wall   motion abnormalities. Doppler parameters are consistent with   abnormal left ventricular relaxation  (grade 1 diastolic   dysfunction). - Aortic valve: There was no stenosis. - Mitral valve: Mildly calcified annulus. There was trivial   regurgitation. - Left atrium: The atrium was moderately dilated. - Right ventricle: The cavity size was normal. Pacer wire or   catheter noted in right ventricle. Systolic function was normal. - Tricuspid valve: Peak RV-RA gradient (S): 16 mm Hg. - Pulmonary arteries: PA peak pressure: 19 mm Hg (S). - Inferior vena cava: The vessel was normal in size. The   respirophasic diameter changes were in the normal range (>= 50%),   consistent with normal central venous pressure.   Impressions: - Normal LV size with moderate LV hypertrophy. EF 55-60%. Normal RV   size and systolic function. No significant valvular   abnormalities.       ASSESSMENT:    1. Paroxysmal atrial fibrillation (HCC)   2. Chronic diastolic CHF (congestive heart failure) ([place])   3. Essential hypertension   4. Pacemaker   5. Hyperlipidemia, unspecified hyperlipidemia type      PLAN:  In order of problems listed above:  PAF has not had a lot of episodes on pacemaker device checks but not a great candidate for anticoagulation.  Will consider if she has recurrence per Dr. Lovena Le.  Last pacer check was stable 04/04/2019. Increased palpitations recently most likely due to increased albuterol inhaler use. Regular rate and rhythm today. No changes.  Chronic diastolic CHF compensated but eating a fair amount of salt including sausage and biscuits. Labs to be drawn at PCP at the end the month.  Low-sodium diet discussed.  Essential hypertension BP controlled.  Pacemaker followed by Dr. Lovena Le  HLD hasn't been checked in 2 yrs. But not fasting today. Can have PCP check   Medication Adjustments/Labs and Tests Ordered: Current medicines are reviewed at length with the patient today.  Concerns regarding medicines are outlined above.  Medication changes, Labs and Tests ordered today are  listed in the Patient Instructions below. Patient Instructions   Medication Instructions:  Your physician recommends that you continue on your current medications as directed. Please refer to the Current Medication list given to you today.  *If you need a refill on your cardiac medications before your next appointment, please call your pharmacy*   Lab Work: None ordered  If you have labs (blood work) drawn today and your tests are completely normal, you will receive your results only by: Marland Kitchen MyChart Message (if you have MyChart) OR . A paper copy in the mail If you have any lab test that is abnormal or we need to change your treatment, we will call you  to review the results.   Testing/Procedures: None ordered   Follow-Up: At [facility], you and your health needs are our priority.  As part of our continuing mission to provide you with exceptional heart care, we have created designated Provider Care Teams.  These Care Teams include your primary Cardiologist (physician) and Advanced Practice Providers (APPs -  Physician Assistants and Nurse Practitioners) who all work together to provide you with the care you need, when you need it.  We recommend signing up for the patient portal called "MyChart".  Sign up information is provided on this After Visit Summary.  MyChart is used to connect with patients for Virtual Visits (Telemedicine).  Patients are able to view lab/test results, encounter notes, upcoming appointments, etc.  Non-urgent messages can be sent to your provider as well.   To learn more about what you can do with MyChart, go to [email or web address].    Your next appointment:   6 month(s)  The format for your next appointment:   In Person  Provider:   You may see Ena Dawley, MD or one of the following Advanced Practice Providers on your designated Care Team:    Melina Copa, PA-C  Ermalinda Barrios, PA-C    Other Instructions Two Gram Sodium Diet 2000 mg  What  is Sodium? Sodium is a mineral found naturally in many foods. The most significant source of sodium in the diet is table salt, which is about 40% sodium.  Processed, convenience, and preserved foods also contain a large amount of sodium.  The body needs only 500 mg of sodium daily to function,  A normal diet provides more than enough sodium even if you do not use salt.  Why Limit Sodium? A build up of sodium in the body can cause thirst, increased blood pressure, shortness of breath, and water retention.  Decreasing sodium in the diet can reduce edema and risk of heart attack or stroke associated with high blood pressure.  Keep in mind that there are many other factors involved in these health problems.  Heredity, obesity, lack of exercise, cigarette smoking, stress and what you eat all play a role.  General Guidelines:  Do not add salt at the table or in cooking.  One teaspoon of salt contains over 2 grams of sodium.  Read food labels  Avoid processed and convenience foods  Ask your dietitian before eating any foods not dicussed in the menu planning guidelines  Consult your physician if you wish to use a salt substitute or a sodium containing medication such as antacids.  Limit milk and milk products to 16 oz (2 cups) per day.  Shopping Hints:  READ LABELS!! "Dietetic" does not necessarily mean low sodium.  Salt and other sodium ingredients are often added to foods during processing.   Menu Planning Guidelines Food Group Choose More Often Avoid  Beverages (see also the milk group All fruit juices, low-sodium, salt-free vegetables juices, low-sodium carbonated beverages Regular vegetable or tomato juices, commercially softened water used for drinking or cooking  Breads and Cereals Enriched white, wheat, rye and pumpernickel bread, hard rolls and dinner rolls; muffins, cornbread and waffles; most dry cereals, cooked cereal without added salt; unsalted crackers and breadsticks; low sodium or  homemade bread crumbs Bread, rolls and crackers with salted tops; quick breads; instant hot cereals; pancakes; commercial bread stuffing; self-rising flower and biscuit mixes; regular bread crumbs or cracker crumbs  Desserts and Sweets Desserts and sweets mad with mild should be within allowance Instant pudding  mixes and cake mixes  Fats Butter or margarine; vegetable oils; unsalted salad dressings, regular salad dressings limited to 1 Tbs; light, sour and heavy cream Regular salad dressings containing bacon fat, bacon bits, and salt pork; snack dips made with instant soup mixes or processed cheese; salted nuts  Fruits Most fresh, frozen and canned fruits Fruits processed with salt or sodium-containing ingredient (some dried fruits are processed with sodium sulfites        Vegetables Fresh, frozen vegetables and low- sodium canned vegetables Regular canned vegetables, sauerkraut, pickled vegetables, and others prepared in brine; frozen vegetables in sauces; vegetables seasoned with ham, bacon or salt pork  Condiments, Sauces, Miscellaneous  Salt substitute with physician's approval; pepper, herbs, spices; vinegar, lemon or lime juice; hot pepper sauce; garlic powder, onion powder, low sodium soy sauce (1 Tbs.); low sodium condiments (ketchup, chili sauce, mustard) in limited amounts (1 tsp.) fresh ground horseradish; unsalted tortilla chips, pretzels, potato chips, popcorn, salsa (1/4 cup) Any seasoning made with salt including garlic salt, celery salt, onion salt, and seasoned salt; sea salt, rock salt, kosher salt; meat tenderizers; monosodium glutamate; mustard, regular soy sauce, barbecue, sauce, chili sauce, teriyaki sauce, steak sauce, Worcestershire sauce, and most flavored vinegars; canned gravy and mixes; regular condiments; salted snack foods, olives, picles, relish, horseradish sauce, catsup   Food preparation: Try these seasonings Meats:    Pork Sage, onion Serve with applesauce  Chicken  Poultry seasoning, thyme, parsley Serve with cranberry sauce  Lamb Curry powder, rosemary, garlic, thyme Serve with mint sauce or jelly  Veal Marjoram, basil Serve with current jelly, cranberry sauce  Beef Pepper, bay leaf Serve with dry mustard, unsalted chive butter  Fish [place] leaf, dill Serve with unsalted lemon butter, unsalted parsley butter  Vegetables:    Asparagus Lemon juice   Broccoli Lemon juice   Carrots Mustard dressing parsley, mint, nutmeg, glazed with unsalted butter and sugar   Green beans Marjoram, lemon juice, nutmeg,dill seed   Tomatoes Basil, marjoram, onion   Spice /blend for [employer]" 4 tsp ground thyme 1 tsp ground sage 3 tsp ground rosemary 4 tsp ground marjoram   Test your knowledge 1. A product that says "Salt Free" may still contain sodium. True or False 2. Garlic Powder and Hot Pepper Sauce an be used as alternative seasonings.True or False 3. Processed foods have more sodium than fresh foods.  True or False 4. Canned Vegetables have less sodium than froze True or False  WAYS TO DECREASE YOUR SODIUM INTAKE 1. Avoid the use of added salt in cooking and at the table.  Table salt (and other prepared seasonings which contain salt) is probably one of the greatest sources of sodium in the diet.  Unsalted foods can gain flavor from the sweet, sour, and butter taste sensations of herbs and spices.  Instead of using salt for seasoning, try the following seasonings with the foods listed.  Remember: how you use them to enhance natural food flavors is limited only by your creativity... Allspice-Meat, fish, eggs, fruit, peas, red and yellow vegetables Almond Extract-Fruit baked goods Anise Seed-Sweet breads, fruit, carrots, beets, cottage cheese, cookies (tastes like licorice) Basil-Meat, fish, eggs, vegetables, rice, vegetables salads, soups, sauces Bay Leaf-Meat, fish, stews, poultry Burnet-Salad, vegetables (cucumber-like flavor) Caraway Seed-Bread, cookies, cottage  cheese, meat, vegetables, cheese, rice Cardamon-Baked goods, fruit, soups Celery Powder or seed-Salads, salad dressings, sauces, meatloaf, soup, bread.Do not use  celery salt Chervil-Meats, salads, fish, eggs, vegetables, cottage cheese (parsley-like flavor) Chili Power-Meatloaf, chicken  cheese, corn, eggplant, egg dishes Chives-Salads cottage cheese, egg dishes, soups, vegetables, sauces Cilantro-Salsa, casseroles Cinnamon-Baked goods, fruit, pork, lamb, chicken, carrots Cloves-Fruit, baked goods, fish, pot roast, green beans, beets, carrots Coriander-Pastry, cookies, meat, salads, cheese (lemon-orange flavor) Cumin-Meatloaf, fish,cheese, eggs, cabbage,fruit pie (caraway flavor) [employer], fruit, eggs, fish, poultry, cottage cheese, vegetables Dill Seed-Meat, cottage cheese, poultry, vegetables, fish, salads, bread Fennel Seed-Bread, cookies, apples, pork, eggs, fish, beets, cabbage, cheese, Licorice-like flavor Garlic-(buds or powder) Salads, meat, poultry, fish, bread, butter, vegetables, potatoes.Do not  use garlic salt Ginger-Fruit, vegetables, baked goods, meat, fish, poultry Horseradish Root-Meet, vegetables, butter Lemon Juice or Extract-Vegetables, fruit, tea, baked goods, fish salads Mace-Baked goods fruit, vegetables, fish, poultry (taste like nutmeg) Maple Extract-Syrups Marjoram-Meat, chicken, fish, vegetables, breads, green salads (taste like Sage) Mint-Tea, lamb, sherbet, vegetables, desserts, carrots, cabbage Mustard, Dry or Seed-Cheese, eggs, meats, vegetables, poultry Nutmeg-Baked goods, fruit, chicken, eggs, vegetables, desserts Onion Powder-Meat, fish, poultry, vegetables, cheese, eggs, bread, rice salads (Do not use   Onion salt) Orange Extract-Desserts, baked goods Oregano-Pasta, eggs, cheese, onions, pork, lamb, fish, chicken, vegetables, green salads Paprika-Meat, fish, poultry, eggs, cheese, vegetables Parsley Flakes-Butter, vegetables, meat fish,  poultry, eggs, bread, salads (certain forms may   Contain sodium Pepper-Meat fish, poultry, vegetables, eggs Peppermint Extract-Desserts, baked goods Poppy Seed-Eggs, bread, cheese, fruit dressings, baked goods, noodles, vegetables, cottage  [employer], poultry, meat, fish, cauliflower, turnips,eggs bread Saffron-Rice, bread, veal, chicken, fish, eggs Sage-Meat, fish, poultry, onions, eggplant, tomateos, pork, stews Savory-Eggs, salads, poultry, meat, rice, vegetables, soups, pork Tarragon-Meat, poultry, fish, eggs, butter, vegetables (licorice-like flavor)  Thyme-Meat, poultry, fish, eggs, vegetables, (clover-like flavor), sauces, soups Tumeric-Salads, butter, eggs, fish, rice, vegetables (saffron-like flavor) Vanilla Extract-Baked goods, candy Vinegar-Salads, vegetables, meat marinades Walnut Extract-baked goods, candy  2. Choose your Foods Wisely   The following is a list of foods to avoid which are high in sodium:  Meats-Avoid all smoked, canned, salt cured, dried and kosher meat and fish as well as Anchovies   [employer] meats:Bologna, Liverwurst, Pastrami Canned meat or fish  Marinated herring Caviar    Pepperoni Corned Beef   Pizza Dried chipped beef  Salami Frozen breaded fish or meat Salt pork Frankfurters or hot dogs  Sardines Gefilte fish   Sausage Ham (boiled ham, Proscuitto Smoked butt    spiced ham)   Spam      TV Dinners Vegetables Canned vegetables (Regular) Relish Canned mushrooms  Sauerkraut Olives    Tomato juice Pickles  Bakery and Dessert Products Canned puddings  Cream pies Cheesecake   Decorated cakes Cookies  Beverages/Juices Tomato juice, regular  Gatorade   V-8 vegetable juice, regular  Breads and Cereals Biscuit mixes   Salted potato chips, corn chips, pretzels Bread stuffing mixes  Salted crackers and rolls Pancake and waffle mixes Self-rising flour  Seasonings Accent    Meat  sauces Barbecue sauce  Meat tenderizer Catsup    Monosodium glutamate (MSG) Celery salt   Onion salt Chili sauce   Prepared mustard Garlic salt   Salt, seasoned salt, sea salt Gravy mixes   Soy sauce Horseradish   Steak sauce Ketchup   Tartar sauce Lite salt    Teriyaki sauce Marinade mixes   Worcestershire sauce  Others Baking powder   Cocoa and cocoa mixes Baking soda   Commercial casserole mixes Candy-caramels, chocolate  Dehydrated soups    Bars, fudge,nougats  Instant rice and pasta mixes Canned broth or soup  Maraschino cherries Cheese, aged and  processed cheese and cheese spreads  Learning Assessment Quiz  Indicated T (for True) or F (for False) for each of the following statements:  1. _____ Fresh fruits and vegetables and unprocessed grains are generally low in sodium 2. _____ Water may contain a considerable amount of sodium, depending on the source 3. _____ You can always tell if a food is high in sodium by tasting it 4. _____ Certain laxatives my be high in sodium and should be avoided unless prescribed   by a physician or pharmacist 5. _____ Salt substitutes may be used freely by anyone on a sodium restricted diet 6. _____ Sodium is present in table salt, food additives and as a natural component of   most foods 7. _____ Table salt is approximately 90% sodium 8. _____ Limiting sodium intake may help prevent excess fluid accumulation in the body 9. _____ On a sodium-restricted diet, seasonings such as bouillon soy sauce, and    cooking wine should be used in place of table salt 10. _____ On an ingredient list, a product which lists monosodium glutamate as the first   ingredient is an appropriate food to include on a low sodium diet  Circle the best answer(s) to the following statements (Hint: there may be more than one correct answer)  11. On a low-sodium diet, some acceptable snack items are:    A. Olives  F. Bean dip   K. Grapefruit juice    B. Salted  Pretzels G. Commercial Popcorn   L. Canned peaches    C. Carrot Sticks  H. Bouillon   M. Unsalted nuts   D. [place] fries  I. Peanut butter crackers N. Salami   E. Sweet pickles J. Tomato Juice   O. Pizza  12.  Seasonings that may be used freely on a reduced - sodium diet include   A. Lemon wedges F.Monosodium glutamate K. Celery seed    B.Soysauce   G. Pepper   L. Mustard powder   C. Sea salt  H. Cooking wine  M. Onion flakes   D. Vinegar  E. Prepared horseradish N. Salsa   E. Sage   J. Worcestershire sauce  O. [address]      Sumner Boast, PA-C  04/25/2019 12:43 PM    [facility] [place], [place], [place]  [address] Phone: [phone]; Fax: [phone]

## 2019-04-25 ENCOUNTER — Ambulatory Visit: Payer: Medicare HMO | Admitting: Physician Assistant

## 2019-04-25 ENCOUNTER — Other Ambulatory Visit: Payer: Self-pay

## 2019-04-25 ENCOUNTER — Encounter: Payer: Self-pay | Admitting: Physician Assistant

## 2019-04-25 VITALS — BP 110/62 | HR 69 | Ht 63.0 in | Wt 164.0 lb

## 2019-04-25 DIAGNOSIS — I5032 Chronic diastolic (congestive) heart failure: Secondary | ICD-10-CM | POA: Diagnosis not present

## 2019-04-25 DIAGNOSIS — I480 Paroxysmal atrial fibrillation: Secondary | ICD-10-CM

## 2019-04-25 DIAGNOSIS — Z950 Presence of cardiac pacemaker: Secondary | ICD-10-CM

## 2019-04-25 DIAGNOSIS — E785 Hyperlipidemia, unspecified: Secondary | ICD-10-CM | POA: Diagnosis not present

## 2019-04-25 DIAGNOSIS — I10 Essential (primary) hypertension: Secondary | ICD-10-CM | POA: Diagnosis not present

## 2019-04-25 NOTE — Patient Instructions (Signed)
Medication Instructions:  Your physician recommends that you continue on your current medications as directed. Please refer to the Current Medication list given to you today.  *If you need a refill on your cardiac medications before your next appointment, please call your pharmacy*   Lab Work: None ordered  If you have labs (blood work) drawn today and your tests are completely normal, you will receive your results only by: Marland Kitchen MyChart Message (if you have MyChart) OR . A paper copy in the mail If you have any lab test that is abnormal or we need to change your treatment, we will call you to review the results.   Testing/Procedures: None ordered   Follow-Up: At [facility], you and your health needs are our priority.  As part of our continuing mission to provide you with exceptional heart care, we have created designated Provider Care Teams.  These Care Teams include your primary Cardiologist (physician) and Advanced Practice Providers (APPs -  Physician Assistants and Nurse Practitioners) who all work together to provide you with the care you need, when you need it.  We recommend signing up for the patient portal called "MyChart".  Sign up information is provided on this After Visit Summary.  MyChart is used to connect with patients for Virtual Visits (Telemedicine).  Patients are able to view lab/test results, encounter notes, upcoming appointments, etc.  Non-urgent messages can be sent to your provider as well.   To learn more about what you can do with MyChart, go to [email or web address].    Your next appointment:   6 month(s)  The format for your next appointment:   In Person  Provider:   You may see Ena Dawley, MD or one of the following Advanced Practice Providers on your designated Care Team:    Melina Copa, PA-C  Ermalinda Barrios, PA-C    Other Instructions Two Gram Sodium Diet 2000 mg  What is Sodium? Sodium is a mineral found naturally in many foods. The  most significant source of sodium in the diet is table salt, which is about 40% sodium.  Processed, convenience, and preserved foods also contain a large amount of sodium.  The body needs only 500 mg of sodium daily to function,  A normal diet provides more than enough sodium even if you do not use salt.  Why Limit Sodium? A build up of sodium in the body can cause thirst, increased blood pressure, shortness of breath, and water retention.  Decreasing sodium in the diet can reduce edema and risk of heart attack or stroke associated with high blood pressure.  Keep in mind that there are many other factors involved in these health problems.  Heredity, obesity, lack of exercise, cigarette smoking, stress and what you eat all play a role.  General Guidelines:  Do not add salt at the table or in cooking.  One teaspoon of salt contains over 2 grams of sodium.  Read food labels  Avoid processed and convenience foods  Ask your dietitian before eating any foods not dicussed in the menu planning guidelines  Consult your physician if you wish to use a salt substitute or a sodium containing medication such as antacids.  Limit milk and milk products to 16 oz (2 cups) per day.  Shopping Hints:  READ LABELS!! "Dietetic" does not necessarily mean low sodium.  Salt and other sodium ingredients are often added to foods during processing.   Menu Planning Guidelines Food Group Choose More Often Avoid  Beverages (see also the  milk group All fruit juices, low-sodium, salt-free vegetables juices, low-sodium carbonated beverages Regular vegetable or tomato juices, commercially softened water used for drinking or cooking  Breads and Cereals Enriched white, wheat, rye and pumpernickel bread, hard rolls and dinner rolls; muffins, cornbread and waffles; most dry cereals, cooked cereal without added salt; unsalted crackers and breadsticks; low sodium or homemade bread crumbs Bread, rolls and crackers with salted tops;  quick breads; instant hot cereals; pancakes; commercial bread stuffing; self-rising flower and biscuit mixes; regular bread crumbs or cracker crumbs  Desserts and Sweets Desserts and sweets mad with mild should be within allowance Instant pudding mixes and cake mixes  Fats Butter or margarine; vegetable oils; unsalted salad dressings, regular salad dressings limited to 1 Tbs; light, sour and heavy cream Regular salad dressings containing bacon fat, bacon bits, and salt pork; snack dips made with instant soup mixes or processed cheese; salted nuts  Fruits Most fresh, frozen and canned fruits Fruits processed with salt or sodium-containing ingredient (some dried fruits are processed with sodium sulfites        Vegetables Fresh, frozen vegetables and low- sodium canned vegetables Regular canned vegetables, sauerkraut, pickled vegetables, and others prepared in brine; frozen vegetables in sauces; vegetables seasoned with ham, bacon or salt pork  Condiments, Sauces, Miscellaneous  Salt substitute with physician's approval; pepper, herbs, spices; vinegar, lemon or lime juice; hot pepper sauce; garlic powder, onion powder, low sodium soy sauce (1 Tbs.); low sodium condiments (ketchup, chili sauce, mustard) in limited amounts (1 tsp.) fresh ground horseradish; unsalted tortilla chips, pretzels, potato chips, popcorn, salsa (1/4 cup) Any seasoning made with salt including garlic salt, celery salt, onion salt, and seasoned salt; sea salt, rock salt, kosher salt; meat tenderizers; monosodium glutamate; mustard, regular soy sauce, barbecue, sauce, chili sauce, teriyaki sauce, steak sauce, Worcestershire sauce, and most flavored vinegars; canned gravy and mixes; regular condiments; salted snack foods, olives, picles, relish, horseradish sauce, catsup   Food preparation: Try these seasonings Meats:    Pork Sage, onion Serve with applesauce  Chicken Poultry seasoning, thyme, parsley Serve with cranberry sauce   Lamb Curry powder, rosemary, garlic, thyme Serve with mint sauce or jelly  Veal Marjoram, basil Serve with current jelly, cranberry sauce  Beef Pepper, bay leaf Serve with dry mustard, unsalted chive butter  Fish [place] leaf, dill Serve with unsalted lemon butter, unsalted parsley butter  Vegetables:    Asparagus Lemon juice   Broccoli Lemon juice   Carrots Mustard dressing parsley, mint, nutmeg, glazed with unsalted butter and sugar   Green beans Marjoram, lemon juice, nutmeg,dill seed   Tomatoes Basil, marjoram, onion   Spice /blend for [employer]" 4 tsp ground thyme 1 tsp ground sage 3 tsp ground rosemary 4 tsp ground marjoram   Test your knowledge 1. A product that says "Salt Free" may still contain sodium. True or False 2. Garlic Powder and Hot Pepper Sauce an be used as alternative seasonings.True or False 3. Processed foods have more sodium than fresh foods.  True or False 4. Canned Vegetables have less sodium than froze True or False  WAYS TO DECREASE YOUR SODIUM INTAKE 1. Avoid the use of added salt in cooking and at the table.  Table salt (and other prepared seasonings which contain salt) is probably one of the greatest sources of sodium in the diet.  Unsalted foods can gain flavor from the sweet, sour, and butter taste sensations of herbs and spices.  Instead of using salt for seasoning, try the  following seasonings with the foods listed.  Remember: how you use them to enhance natural food flavors is limited only by your creativity... Allspice-Meat, fish, eggs, fruit, peas, red and yellow vegetables Almond Extract-Fruit baked goods Anise Seed-Sweet breads, fruit, carrots, beets, cottage cheese, cookies (tastes like licorice) Basil-Meat, fish, eggs, vegetables, rice, vegetables salads, soups, sauces Bay Leaf-Meat, fish, stews, poultry Burnet-Salad, vegetables (cucumber-like flavor) Caraway Seed-Bread, cookies, cottage cheese, meat, vegetables, cheese, rice Cardamon-Baked  goods, fruit, soups Celery Powder or seed-Salads, salad dressings, sauces, meatloaf, soup, bread.Do not use  celery salt Chervil-Meats, salads, fish, eggs, vegetables, cottage cheese (parsley-like flavor) Chili Power-Meatloaf, chicken cheese, corn, eggplant, egg dishes Chives-Salads cottage cheese, egg dishes, soups, vegetables, sauces Cilantro-Salsa, casseroles Cinnamon-Baked goods, fruit, pork, lamb, chicken, carrots Cloves-Fruit, baked goods, fish, pot roast, green beans, beets, carrots Coriander-Pastry, cookies, meat, salads, cheese (lemon-orange flavor) Cumin-Meatloaf, fish,cheese, eggs, cabbage,fruit pie (caraway flavor) [employer], fruit, eggs, fish, poultry, cottage cheese, vegetables Dill Seed-Meat, cottage cheese, poultry, vegetables, fish, salads, bread Fennel Seed-Bread, cookies, apples, pork, eggs, fish, beets, cabbage, cheese, Licorice-like flavor Garlic-(buds or powder) Salads, meat, poultry, fish, bread, butter, vegetables, potatoes.Do not  use garlic salt Ginger-Fruit, vegetables, baked goods, meat, fish, poultry Horseradish Root-Meet, vegetables, butter Lemon Juice or Extract-Vegetables, fruit, tea, baked goods, fish salads Mace-Baked goods fruit, vegetables, fish, poultry (taste like nutmeg) Maple Extract-Syrups Marjoram-Meat, chicken, fish, vegetables, breads, green salads (taste like Sage) Mint-Tea, lamb, sherbet, vegetables, desserts, carrots, cabbage Mustard, Dry or Seed-Cheese, eggs, meats, vegetables, poultry Nutmeg-Baked goods, fruit, chicken, eggs, vegetables, desserts Onion Powder-Meat, fish, poultry, vegetables, cheese, eggs, bread, rice salads (Do not use   Onion salt) Orange Extract-Desserts, baked goods Oregano-Pasta, eggs, cheese, onions, pork, lamb, fish, chicken, vegetables, green salads Paprika-Meat, fish, poultry, eggs, cheese, vegetables Parsley Flakes-Butter, vegetables, meat fish, poultry, eggs, bread, salads (certain forms may   Contain  sodium Pepper-Meat fish, poultry, vegetables, eggs Peppermint Extract-Desserts, baked goods Poppy Seed-Eggs, bread, cheese, fruit dressings, baked goods, noodles, vegetables, cottage  [employer], poultry, meat, fish, cauliflower, turnips,eggs bread Saffron-Rice, bread, veal, chicken, fish, eggs Sage-Meat, fish, poultry, onions, eggplant, tomateos, pork, stews Savory-Eggs, salads, poultry, meat, rice, vegetables, soups, pork Tarragon-Meat, poultry, fish, eggs, butter, vegetables (licorice-like flavor)  Thyme-Meat, poultry, fish, eggs, vegetables, (clover-like flavor), sauces, soups Tumeric-Salads, butter, eggs, fish, rice, vegetables (saffron-like flavor) Vanilla Extract-Baked goods, candy Vinegar-Salads, vegetables, meat marinades Walnut Extract-baked goods, candy  2. Choose your Foods Wisely   The following is a list of foods to avoid which are high in sodium:  Meats-Avoid all smoked, canned, salt cured, dried and kosher meat and fish as well as Anchovies   [employer] meats:Bologna, Liverwurst, Pastrami Canned meat or fish  Marinated herring Caviar    Pepperoni Corned Beef   Pizza Dried chipped beef  Salami Frozen breaded fish or meat Salt pork Frankfurters or hot dogs  Sardines Gefilte fish   Sausage Ham (boiled ham, Proscuitto Smoked butt    spiced ham)   Spam      TV Dinners Vegetables Canned vegetables (Regular) Relish Canned mushrooms  Sauerkraut Olives    Tomato juice Pickles  Bakery and Dessert Products Canned puddings  Cream pies Cheesecake   Decorated cakes Cookies  Beverages/Juices Tomato juice, regular  Gatorade   V-8 vegetable juice, regular  Breads and Cereals Biscuit mixes   Salted potato chips, corn chips, pretzels Bread stuffing mixes  Salted crackers and rolls Pancake and waffle mixes Self-rising flour  Seasonings Accent    Meat  sauces Barbecue sauce  Meat tenderizer Catsup    Monosodium  glutamate (MSG) Celery salt   Onion salt Chili sauce   Prepared mustard Garlic salt   Salt, seasoned salt, sea salt Gravy mixes   Soy sauce Horseradish   Steak sauce Ketchup   Tartar sauce Lite salt    Teriyaki sauce Marinade mixes   Worcestershire sauce  Others Baking powder   Cocoa and cocoa mixes Baking soda   Commercial casserole mixes Candy-caramels, chocolate  Dehydrated soups    Bars, fudge,nougats  Instant rice and pasta mixes Canned broth or soup  Maraschino cherries Cheese, aged and processed cheese and cheese spreads  Learning Assessment Quiz  Indicated T (for True) or F (for False) for each of the following statements:  1. _____ Fresh fruits and vegetables and unprocessed grains are generally low in sodium 2. _____ Water may contain a considerable amount of sodium, depending on the source 3. _____ You can always tell if a food is high in sodium by tasting it 4. _____ Certain laxatives my be high in sodium and should be avoided unless prescribed   by a physician or pharmacist 5. _____ Salt substitutes may be used freely by anyone on a sodium restricted diet 6. _____ Sodium is present in table salt, food additives and as a natural component of   most foods 7. _____ Table salt is approximately 90% sodium 8. _____ Limiting sodium intake may help prevent excess fluid accumulation in the body 9. _____ On a sodium-restricted diet, seasonings such as bouillon soy sauce, and    cooking wine should be used in place of table salt 10. _____ On an ingredient list, a product which lists monosodium glutamate as the first   ingredient is an appropriate food to include on a low sodium diet  Circle the best answer(s) to the following statements (Hint: there may be more than one correct answer)  11. On a low-sodium diet, some acceptable snack items are:    A. Olives  F. Bean dip   K. Grapefruit juice    B. Salted Pretzels G. Commercial Popcorn   L. Canned peaches    C. Carrot  Sticks  H. Bouillon   M. Unsalted nuts   D. [place] fries  I. Peanut butter crackers N. Salami   E. Sweet pickles J. Tomato Juice   O. Pizza  12.  Seasonings that may be used freely on a reduced - sodium diet include   A. Lemon wedges F.Monosodium glutamate K. Celery seed    B.Soysauce   G. Pepper   L. Mustard powder   C. Sea salt  H. Cooking wine  M. Onion flakes   D. Vinegar  E. Prepared horseradish N. Salsa   E. Sage   J. Worcestershire sauce  O. Chutney

## 2019-05-03 ENCOUNTER — Emergency Department (HOSPITAL_COMMUNITY): Payer: Medicare HMO

## 2019-05-03 ENCOUNTER — Emergency Department (HOSPITAL_COMMUNITY)
Admission: EM | Admit: 2019-05-03 | Discharge: 2019-05-04 | Disposition: A | Discharge status: Home / Self Care | Payer: Medicare HMO | Attending: Emergency Medicine | Admitting: Emergency Medicine

## 2019-05-03 DIAGNOSIS — Z79899 Other long term (current) drug therapy: Secondary | ICD-10-CM | POA: Insufficient documentation

## 2019-05-03 DIAGNOSIS — R079 Chest pain, unspecified: Secondary | ICD-10-CM | POA: Diagnosis not present

## 2019-05-03 DIAGNOSIS — I130 Hypertensive heart and chronic kidney disease with heart failure and stage 1 through stage 4 chronic kidney disease, or unspecified chronic kidney disease: Secondary | ICD-10-CM | POA: Insufficient documentation

## 2019-05-03 DIAGNOSIS — S2241XA Multiple fractures of ribs, right side, initial encounter for closed fracture: Secondary | ICD-10-CM | POA: Diagnosis not present

## 2019-05-03 DIAGNOSIS — R0602 Shortness of breath: Secondary | ICD-10-CM | POA: Diagnosis not present

## 2019-05-03 DIAGNOSIS — W0110XA Fall on same level from slipping, tripping and stumbling with subsequent striking against unspecified object, initial encounter: Secondary | ICD-10-CM | POA: Insufficient documentation

## 2019-05-03 DIAGNOSIS — J45909 Unspecified asthma, uncomplicated: Secondary | ICD-10-CM | POA: Diagnosis not present

## 2019-05-03 DIAGNOSIS — W19XXXS Unspecified fall, sequela: Secondary | ICD-10-CM | POA: Diagnosis not present

## 2019-05-03 DIAGNOSIS — Y939 Activity, unspecified: Secondary | ICD-10-CM | POA: Diagnosis not present

## 2019-05-03 DIAGNOSIS — I10 Essential (primary) hypertension: Secondary | ICD-10-CM | POA: Diagnosis not present

## 2019-05-03 DIAGNOSIS — Z7982 Long term (current) use of aspirin: Secondary | ICD-10-CM | POA: Diagnosis not present

## 2019-05-03 DIAGNOSIS — Z950 Presence of cardiac pacemaker: Secondary | ICD-10-CM | POA: Insufficient documentation

## 2019-05-03 DIAGNOSIS — W19XXXD Unspecified fall, subsequent encounter: Secondary | ICD-10-CM | POA: Diagnosis not present

## 2019-05-03 DIAGNOSIS — Z9049 Acquired absence of other specified parts of digestive tract: Secondary | ICD-10-CM | POA: Diagnosis not present

## 2019-05-03 DIAGNOSIS — R071 Chest pain on breathing: Secondary | ICD-10-CM | POA: Diagnosis not present

## 2019-05-03 DIAGNOSIS — Z85828 Personal history of other malignant neoplasm of skin: Secondary | ICD-10-CM | POA: Diagnosis not present

## 2019-05-03 DIAGNOSIS — I4891 Unspecified atrial fibrillation: Secondary | ICD-10-CM | POA: Diagnosis not present

## 2019-05-03 DIAGNOSIS — R0781 Pleurodynia: Secondary | ICD-10-CM | POA: Diagnosis not present

## 2019-05-03 DIAGNOSIS — Y929 Unspecified place or not applicable: Secondary | ICD-10-CM | POA: Insufficient documentation

## 2019-05-03 DIAGNOSIS — S2249XD Multiple fractures of ribs, unspecified side, subsequent encounter for fracture with routine healing: Secondary | ICD-10-CM | POA: Diagnosis not present

## 2019-05-03 DIAGNOSIS — Z96652 Presence of left artificial knee joint: Secondary | ICD-10-CM | POA: Insufficient documentation

## 2019-05-03 DIAGNOSIS — N183 Chronic kidney disease, stage 3 unspecified: Secondary | ICD-10-CM | POA: Insufficient documentation

## 2019-05-03 DIAGNOSIS — Z87891 Personal history of nicotine dependence: Secondary | ICD-10-CM | POA: Diagnosis not present

## 2019-05-03 DIAGNOSIS — Y999 Unspecified external cause status: Secondary | ICD-10-CM | POA: Diagnosis not present

## 2019-05-03 DIAGNOSIS — I5032 Chronic diastolic (congestive) heart failure: Secondary | ICD-10-CM | POA: Diagnosis not present

## 2019-05-03 DIAGNOSIS — S2241XD Multiple fractures of ribs, right side, subsequent encounter for fracture with routine healing: Secondary | ICD-10-CM | POA: Diagnosis not present

## 2019-05-03 DIAGNOSIS — I129 Hypertensive chronic kidney disease with stage 1 through stage 4 chronic kidney disease, or unspecified chronic kidney disease: Secondary | ICD-10-CM | POA: Diagnosis not present

## 2019-05-03 DIAGNOSIS — S299XXA Unspecified injury of thorax, initial encounter: Secondary | ICD-10-CM | POA: Diagnosis present

## 2019-05-03 LAB — BASIC METABOLIC PANEL
Anion gap: 9 (ref 5–15)
BUN: 29 mg/dL — ABNORMAL HIGH (ref 8–23)
CO2: 24 mmol/L (ref 22–32)
Calcium: 9.4 mg/dL (ref 8.9–10.3)
Chloride: 109 mmol/L (ref 98–111)
Creatinine, Ser: 1.71 mg/dL — ABNORMAL HIGH (ref 0.44–1.00)
GFR calc Af Amer: 30 mL/min — ABNORMAL LOW (ref >60)
GFR calc non Af Amer: 26 mL/min — ABNORMAL LOW (ref >60)
Glucose, Bld: 114 mg/dL — ABNORMAL HIGH (ref 70–99)
Potassium: 4.7 mmol/L (ref 3.5–5.1)
Sodium: 142 mmol/L (ref 135–145)

## 2019-05-03 LAB — CBC
HCT: 34.6 % — ABNORMAL LOW (ref 36.0–46.0)
Hemoglobin: 11.2 g/dL — ABNORMAL LOW (ref 12.0–15.0)
MCH: 32.7 pg (ref 26.0–34.0)
MCHC: 32.4 g/dL (ref 30.0–36.0)
MCV: 100.9 fL — ABNORMAL HIGH (ref 80.0–100.0)
Platelets: 153 10*3/uL (ref 150–400)
RBC: 3.43 MIL/uL — ABNORMAL LOW (ref 3.87–5.11)
RDW: 13.1 % (ref 11.5–15.5)
WBC: 5.4 10*3/uL (ref 4.0–10.5)
nRBC: 0 % (ref 0.0–0.2)

## 2019-05-03 LAB — TROPONIN I (HIGH SENSITIVITY)
Troponin I (High Sensitivity): 10 ng/L (ref <18)
Troponin I (High Sensitivity): 11 ng/L (ref <18)

## 2019-05-03 MED ORDER — SODIUM CHLORIDE 0.9% FLUSH: 3.0000 mL | Freq: Once | INTRAVENOUS | Status: DC

## 2019-05-03 NOTE — ED Triage Notes (Signed)
Pt arrives via pov from MD office with CP. Blood work per MD office: Ddimer 1.27 Troponin 17 CK 36 Pt reports chest pain is worse with inspiration

## 2019-05-04 ENCOUNTER — Other Ambulatory Visit: Payer: Self-pay

## 2019-05-04 ENCOUNTER — Emergency Department (HOSPITAL_COMMUNITY): Payer: Medicare HMO

## 2019-05-04 DIAGNOSIS — S2241XD Multiple fractures of ribs, right side, subsequent encounter for fracture with routine healing: Secondary | ICD-10-CM | POA: Diagnosis not present

## 2019-05-04 DIAGNOSIS — R0602 Shortness of breath: Secondary | ICD-10-CM | POA: Diagnosis not present

## 2019-05-04 MED ORDER — TECHNETIUM TO 99M ALBUMIN AGGREGATED
1.6300 | Freq: Once | INTRAVENOUS | Status: AC | PRN
  Administered 2019-05-04: 04:00:00 1.63 via INTRAVENOUS

## 2019-05-04 MED ORDER — ACETAMINOPHEN 500 MG PO TABS
1000.0000 mg | ORAL_TABLET | Freq: Once | ORAL | Status: AC
  Administered 2019-05-04: 1000 mg via ORAL
  Filled 2019-05-04: qty 2

## 2019-05-04 NOTE — ED Notes (Signed)
[phone] pts granddaughter Wynona Canes, call when an update is avail. Aware pt just got roomed

## 2019-05-04 NOTE — Discharge Instructions (Signed)
Take tylenol 1000mg (2 extra strength) four times a day.    Use the incentive spirometer 10 minutes out of every hour that you are awake.  Please return for worsening trouble breathing or fever.  Follow-up with your family doctor in the office.  He can use a small pillow to hold pressure against the side of your chest that hurts when you take a deep breath or cough or sneeze.

## 2019-05-04 NOTE — ED Provider Notes (Signed)
[facility] EMERGENCY DEPARTMENT Provider Note   CSN: [id number] Arrival date & time: 05/03/19  1716     History Chief Complaint  Patient presents with  . Chest Pain    [name] is a 86 y.o. female.  86 yo F with a chief complaints of right-sided chest wall pain.  Patient states that she had a nonsyncopal fall couple weeks ago and has been having pain since.  Worse with deep breathing twisting and palpation.  She saw her family doctor today in follow-up and they told her that she may have shingles and ended up doing lab work on her.  She had been called at home and told she needed to come to the emergency department.  She is not exactly sure why.  Per the nursing note the patient had an elevated D-dimer in the doctor's office.  The history is provided by the patient.  Chest Pain Pain location:  R chest and R lateral chest Pain quality: sharp and shooting   Pain radiates to:  Does not radiate Pain severity:  Moderate Onset quality:  Gradual Duration:  2 weeks Timing:  Constant Progression:  Waxing and waning Chronicity:  New Relieved by:  Nothing Worsened by:  Nothing Ineffective treatments:  None tried Associated symptoms: shortness of breath   Associated symptoms: no cough, no dizziness, no fever, no headache, no nausea, no palpitations and no vomiting        Past Medical History:  Diagnosis Date  . Anemia    "I stay anemic"  . Arthritis   . Asthma    no problems recent  . Cataract   . Chronic diastolic CHF (congestive heart failure), NYHA class 2 ([place]) 03/19/2014   Echo 4/19: mod LVH, EF 55-60, no RWMA, Gr 1 DD, MAC, trivial MR, mod LAE, normal RVSF, PASP 19  . CKD (chronic kidney disease), stage III   . Complication of anesthesia    CONFUSED AFTER KNEE SURGERY  . Depression   . Diverticulitis   . DVT (deep venous thrombosis) ([place])    from birth control, left groin  . Essential hypertension   . Fibromyalgia 10 y.a.  . GERD  (gastroesophageal reflux disease)   . Gout   . Hx of cardiovascular stress test    Lexiscan Myoview (8/15): apical attenuation artifact, no ischemia, EF 69% - low risk.  Marland Kitchen Hypercholesteremia   . Odynophagia   . Other abnormal glucose   . PAF (paroxysmal atrial fibrillation) ([place])   . Personal history of other diseases of digestive system   . Pneumonia    hx of  . Presence of permanent cardiac pacemaker   . Sinus bradycardia   . Skin cancer 2014   skin R elbow & face  . Symptomatic bradycardia 06/2016    Patient Active Problem List   Diagnosis Date Noted  . Hip fracture ([place]) 02/10/2018  . Closed comminuted intertrochanteric fracture of left femur ([place]) 02/10/2018  . Pacemaker 07/06/2017  . Chronic respiratory failure with hypoxia ([place]) 06/09/2017  . Pleural effusion associated with pulmonary infection 06/09/2017  . Hyperkalemia 06/09/2017  . Pleuritic chest pain 06/09/2017  . Fever 05/14/2017  . Renal insufficiency   . Constipation 04/25/2017  . Urine finding 04/25/2017  . Posterior rhinorrhea 04/25/2017  . Acute upper respiratory infection 04/25/2017  . Thrombocytopenia ([place]) 01/15/2017  . CAP (community acquired pneumonia) 01/15/2017  . Symptomatic bradycardia 06/30/2016  . Hypertensive heart disease 04/01/2016  . Bradycardia 04/01/2016  . Fall 04/01/2016  .  Sinus bradycardia   . CKD (chronic kidney disease), stage III   . Normochromic normocytic anemia   . Essential hypertension   . PAF (paroxysmal atrial fibrillation) ([place])   . Palpitations 09/18/2014  . Shortness of breath 03/19/2014  . Chronic diastolic CHF (congestive heart failure), NYHA class 2 ([place]) 03/19/2014  . Fatigue 09/11/2013  . Gastroenteritis 07/20/2013  . Abdominal pain 07/20/2013  . Abnormal x-ray of abdomen 07/20/2013  . Diarrhea 07/20/2013  . Generalized weakness 07/20/2013  . Obesity (BMI 30-39.9) 07/20/2013  . Hypercholesteremia   . GERD (gastroesophageal reflux disease)   . Depression     . Asthma   . Gout   . Acute blood loss anemia 04/05/2013  . Osteoarthritis of left knee 04/04/2013  . Total knee replacement status 04/04/2013  . Pulmonary hypertension ([place]) 03/07/2013  . Anemia, unspecified 03/19/2012  . Unspecified deficiency anemia 03/19/2012  . Dizziness 07/09/2010  . Chest pain 07/30/2009  . HYPERTENSION, UNSPECIFIED 08/19/2008  . PAROXYSMAL ATRIAL FIBRILLATION 08/19/2008  . GASTROESOPHAGEAL REFLUX DISEASE, HX OF 08/19/2008    Past Surgical History:  Procedure Laterality Date  . ABDOMINAL HYSTERECTOMY     partial  . APPENDECTOMY  ~55years ago  . CHOLECYSTECTOMY  10/2005   Dr. Effie Shy  . COLONOSCOPY W/ BIOPSIES  2005, 2011   Dr. Bing Plume, "balloon inside for last one at [facility]"  . INTRAMEDULLARY (IM) NAIL INTERTROCHANTERIC Left 02/10/2018   Procedure: INTRAMEDULLARY (IM) NAIL INTERTROCHANTRIC;  Surgeon: Rod Can, MD;  Location: [facility];  Service: Orthopedics;  Laterality: Left;  . IR THORACENTESIS ASP PLEURAL SPACE W/IMG GUIDE  06/13/2017  . KNEE ARTHROSCOPY Right   . LAMINECTOMY  05/2007   foraminotomy, L4-5 laminectomy  . LARYNGOSCOPY / BRONCHOSCOPY / ESOPHAGOSCOPY  2010   Dr. Benjamine Mola  . PACEMAKER IMPLANT N/A 07/01/2016   Procedure: Pacemaker Implant;  Surgeon: Evans Lance, MD;  Location: [facility];  Service: Cardiovascular;  Laterality: N/A;  . SKIN CANCER EXCISION Right ~2005   r elbow  . TOTAL KNEE ARTHROPLASTY Left 04/04/2013   Procedure: LEFT TOTAL KNEE ARTHROPLASTY;  Surgeon: Tobi Bastos, MD;  Location: [facility];  Service: Orthopedics;  Laterality: Left;  . TUBAL LIGATION    . TUMOR REMOVAL Right ~ 20 years ago   tumor in between toes     OB History   No obstetric history on file.     Family History  Problem Relation Age of Onset  . Heart disease Mother   . Hypertension Mother   . Fainting Mother   . Arrhythmia Mother   . Diabetes Father   . Stroke Father   . Hypertension Father   . Cancer Brother   .  Sudden death Brother   . Heart attack Brother   . Cancer Sister   . Diabetes Sister   . Asthma Sister     Social History   Tobacco Use  . Smoking status: Former Smoker    Years: 10.00    Types: Cigarettes    Quit date: 02/08/1973    Years since quitting: 46.2  . Smokeless tobacco: Never Used  Substance Use Topics  . Alcohol use: No  . Drug use: No    Home Medications Prior to Admission medications   Medication Sig Start Date End Date Taking? Authorizing Provider  acetaminophen (TYLENOL) 500 MG tablet Take 2 tablets (1,000 mg total) by mouth every 8 (eight) hours as needed for mild pain or headache. Patient taking differently: Take 500 mg by  mouth every 8 (eight) hours as needed for mild pain or headache.  05/16/17   Hongalgi, Lenis Dickinson, MD  albuterol (PROVENTIL HFA;VENTOLIN HFA) 108 (90 BASE) MCG/ACT inhaler Inhale 1 puff into the lungs every 6 (six) hours as needed for wheezing or shortness of breath. 11/13/14   Dorothy Spark, MD  alendronate (FOSAMAX) 70 MG tablet Take 70 mg by mouth once a week. On Friday 02/13/14   [provider]  allopurinol (ZYLOPRIM) 100 MG tablet Take 1 tablet (100 mg total) by mouth daily. 04/01/16   Haney, Yetta Flock A, MD  amLODipine (NORVASC) 2.5 MG tablet Take 1 tablet (2.5 mg total) by mouth daily. Please make overdue appt with Dr. Meda Coffee before anymore refills. 1st attempt 08/09/18   Dorothy Spark, MD  aspirin EC 81 MG tablet Take 81 mg by mouth daily.     [provider]  budesonide-formoterol (SYMBICORT) 80-4.5 MCG/ACT inhaler Inhale 2 puffs into the lungs 2 (two) times daily. 08/02/18   Chesley Mires, MD  famotidine (PEPCID) 20 MG tablet Take 20 mg by mouth 2 (two) times daily.    [provider]  ferrous sulfate 325 (65 FE) MG tablet Take 325 mg by mouth daily.     [provider]  methocarbamol (ROBAXIN) 500 MG tablet Take 500 mg by mouth 2 (two) times daily as needed for muscle spasms.    [provider]    ondansetron (ZOFRAN) 4 MG tablet Take 4 mg by mouth every 8 (eight) hours as needed for nausea or vomiting.    [provider]  ondansetron (ZOFRAN-ODT) 8 MG disintegrating tablet Take 8 mg by mouth every 8 (eight) hours as needed. 03/26/19   [provider]  pravastatin (PRAVACHOL) 20 MG tablet Take 20 mg by mouth at bedtime.  09/17/14   [provider]  sertraline (ZOLOFT) 50 MG tablet Take 50 mg by mouth daily.  03/15/12   [provider]    Allergies    Amoxicillin, Diltiazem, Diltiazem hcl, Losartan potassium, Potassium-containing compounds, Sulfonamide derivatives, Zetia [ezetimibe], and Penicillins  Review of Systems   Review of Systems  Constitutional: Negative for chills and fever.  HENT: Negative for congestion and rhinorrhea.   Eyes: Negative for redness and visual disturbance.  Respiratory: Positive for shortness of breath. Negative for cough and wheezing.   Cardiovascular: Positive for chest pain. Negative for palpitations.  Gastrointestinal: Negative for nausea and vomiting.  Genitourinary: Negative for dysuria and urgency.  Musculoskeletal: Negative for arthralgias and myalgias.  Skin: Negative for pallor and wound.  Neurological: Negative for dizziness and headaches.    Physical Exam Updated Vital Signs BP (!) 144/58   Pulse (!) 58   Temp 98.2 F (36.8 C) (Oral)   Resp 13   Ht _0  (1.6 m)   Wt 74.4 kg   SpO2 99%   BMI 29.05 kg/m   Physical Exam Vitals and nursing note reviewed.  Constitutional:      General: She is not in acute distress.    Appearance: She is well-developed. She is not diaphoretic.  HENT:     Head: Normocephalic and atraumatic.  Eyes:     Pupils: Pupils are equal, round, and reactive to light.  Cardiovascular:     Rate and Rhythm: Normal rate and regular rhythm.     Heart sounds: No murmur. No friction rub. No gallop.   Pulmonary:     Effort: Pulmonary effort is normal.     Breath sounds: No wheezing  or rales.  Chest:     Chest wall: Tenderness present.     Comments: Tenderness to the right anterior rib angle about ribs 4 through 6.  Scattered areas of erythema to the right lateral chest wall. Abdominal:     General: There is no distension.     Palpations: Abdomen is soft.     Tenderness: There is no abdominal tenderness.  Musculoskeletal:        General: No tenderness.     Cervical back: Normal range of motion and neck supple.  Skin:    General: Skin is warm and dry.  Neurological:     Mental Status: She is alert and oriented to person, place, and time.  Psychiatric:        Behavior: Behavior normal.     ED Results / Procedures / Treatments   Labs (all labs ordered are listed, but only abnormal results are displayed) Labs Reviewed  BASIC METABOLIC PANEL - Abnormal; Notable for the following components:      Result Value   Glucose, Bld 114 (*)    BUN 29 (*)    Creatinine, Ser 1.71 (*)    GFR calc non Af Amer 26 (*)    GFR calc Af Amer 30 (*)    All other components within normal limits  CBC - Abnormal; Notable for the following components:   RBC 3.43 (*)    Hemoglobin 11.2 (*)    HCT 34.6 (*)    MCV 100.9 (*)    All other components within normal limits  TROPONIN I (HIGH SENSITIVITY)  TROPONIN I (HIGH SENSITIVITY)    EKG EKG Interpretation  Date/Time:  Thursday May 03 2019 17:33:51 EDT Ventricular Rate:  66 PR Interval:  166 QRS Duration: 90 QT Interval:  404 QTC Calculation: 423 R Axis:   -68 Text Interpretation: Normal sinus rhythm Left anterior fascicular block Abnormal ECG No significant change since last tracing Confirmed by Deno Etienne [phone]) on 05/04/2019 12:51:47 AM   Radiology DG Chest 2 View  Result Date: 05/03/2019 CLINICAL DATA:  Chest pain. EXAM: CHEST - 2 VIEW COMPARISON:  04/13/2019 FINDINGS: The pacer wires are stable. Stable mild cardiac enlargement and stable tortuosity, ectasia and calcification of the thoracic aorta. Mild chronic  bronchitic type lung changes but no acute pulmonary findings. No pleural effusion. The bony thorax is intact. IMPRESSION: No acute cardiopulmonary findings. Electronically Signed   By: Marijo Sanes M.D.   On: 05/03/2019 18:44   CT Chest Wo Contrast  Result Date: 05/04/2019 CLINICAL DATA:  Initial evaluation for acute chest pain, worse with inspiration. EXAM: CT CHEST WITHOUT CONTRAST TECHNIQUE: Multidetector CT imaging of the chest was performed following the standard protocol without IV contrast. COMPARISON:  Prior radiograph from 05/03/2019. FINDINGS: Cardiovascular: Intrathoracic aorta normal in caliber without aneurysm. Moderate atheromatous change throughout the visualized aorta. Partially visualized great vessels grossly within normal limits. Cardiomegaly noted. Diffuse 3 vessel coronary artery calcifications. No pericardial effusion. Left-sided pacemaker/AICD in place. Main pulmonary artery dilated up to 4.6 cm in diameter, suggesting underlying pulmonary hypertension. Mediastinum/Nodes: Incidental note made of a 12 mm nodule at the inferior right thyroid lobe, of doubtful significance given size and patient age. No follow-up imaging recommended regarding this lesion. Thyroid otherwise unremarkable. No pathologically enlarged mediastinal lymph nodes. No appreciable hilar adenopathy on this noncontrast examination. No enlarged axillary nodes. Esophagus within normal limits. Lungs/Pleura: Tracheobronchial tree intact and patent. Lungs well inflated bilaterally. Scattered fine subpleural reticular densities noted within the lungs bilaterally  with associated mild bronchiectasis, similar as compared to previous exam. Scattered linear subsegmental atelectasis and/or scarring noted. No focal infiltrates or consolidative airspace disease. No pulmonary edema or pleural effusion. No pneumothorax. No worrisome pulmonary nodule or mass. Upper Abdomen: Punctate calcified granuloma noted within the liver. Gallbladder  surgically absent. 3.6 cm simple exophytic cyst extends from the posterior right kidney. Probable splenomegaly with the spleen measuring 14.6 cm in AP diameter, partially visualized. Extensive vascular calcifications noted about the splenic artery. Remainder the visualized upper abdomen demonstrates no acute finding. Musculoskeletal: There are subacute healing fractures of the right anterior third through sixth ribs. Chronic T12 compression fracture noted. No acute osseous abnormality. No discrete or worrisome osseous lesions. IMPRESSION: 1. No CT evidence for acute cardiopulmonary abnormality. 2. Cardiomegaly with diffuse 3 vessel coronary artery calcifications and aortic atherosclerosis. 3. Scattered fine subpleural reticular densities involving both lungs, suspected to reflect a degree of underlying fibrotic lung disease, similar to previous. 4. Dilated main pulmonary artery, suggesting underlying pulmonary hypertension. 5. Subacute healing fractures of the right anterior third through sixth ribs. 6. Probable splenomegaly, partially visualized. Electronically Signed   By: Jeannine Boga M.D.   On: 05/04/2019 02:40   NM Pulmonary Perfusion  Result Date: 05/04/2019 CLINICAL DATA:  Shortness of breath, suspect PE EXAM: NUCLEAR MEDICINE PERFUSION LUNG SCAN TECHNIQUE: Perfusion images were obtained in multiple projections after intravenous injection of radiopharmaceutical. Ventilation scans intentionally deferred if perfusion scan and chest x-ray adequate for interpretation during COVID 19 epidemic. RADIOPHARMACEUTICALS:  1.630 mCi Tc-29mMAA IV COMPARISON:  None. FINDINGS: Normal radiotracer uptake seen throughout. No large perfusion defects are seen. IMPRESSION: Normal examination. Electronically Signed   By: BPrudencio PairM.D.   On: 05/04/2019 04:26    Procedures Procedures (including critical care time)  Medications Ordered in ED Medications  sodium chloride flush (NS) 0.9 % injection 3 mL (3 mLs  Intravenous Not Given 05/04/19 0057)  acetaminophen (TYLENOL) tablet 1,000 mg (1,000 mg Oral Given 05/04/19 0100)  technetium albumin aggregated (MAA) injection solution 15.44millicurie (19.20millicuries Intravenous Contrast Given 05/04/19 0345)    ED Course  I have reviewed the triage vital signs and the nursing notes.  Pertinent labs & imaging results that were available during my care of the patient were reviewed by me and considered in my medical decision making (see chart for details).    MDM Rules/Calculators/A&P                      86yo F with a chief complaints of right-sided chest wall pain.  Going on for a couple weeks after a nonsyncopal fall.  Patient had an elevated D-dimer in the office and was sent here for further evaluation.  Her delta troponin here is negative.  EKG without concerning ischemic findings.  Most likely by history her symptoms are caused by musculoskeletal chest wall pain.  Her D-dimer is elevated in the office this could be explained by her recent trauma but since she was sent by her PCP will obtain a CT angiogram to evaluate for rib fracture or pulmonary embolism.  Unfortunately the patients gfr is below the allotted threshold for contrast administration in our hospital system.  Will obtain VQ, though I feel fracture is most likely will also get CT without.   CT scan is negative.  CT scan shows what was the most likely right-sided rib fractures 3 through 6.  No signs of pneumonia.  I discussed the results with the patient  and family.  They will use incentive spirometer.  PCP follow-up.  4:45 AM:  I have discussed the diagnosis/risks/treatment options with the patient and family and believe the pt to be eligible for discharge home to follow-up with PCP. We also discussed returning to the ED immediately if new or worsening sx occur. We discussed the sx which are most concerning (e.g., sudden worsening pain, fever, sob) that necessitate immediate return. Medications  administered to the patient during their visit and any new prescriptions provided to the patient are listed below.  Medications given during this visit Medications  sodium chloride flush (NS) 0.9 % injection 3 mL (3 mLs Intravenous Not Given 05/04/19 0057)  acetaminophen (TYLENOL) tablet 1,000 mg (1,000 mg Oral Given 05/04/19 0100)  technetium albumin aggregated (MAA) injection solution 2.80 millicurie (0.34 millicuries Intravenous Contrast Given 05/04/19 0345)     The patient appears reasonably screen and/or stabilized for discharge and I doubt any other medical condition or other [facility] requiring further screening, evaluation, or treatment in the ED at this time prior to discharge.   Final Clinical Impression(s) / ED Diagnoses Final diagnoses:  Closed fracture of multiple ribs of right side, initial encounter    Rx / DC Orders ED Discharge Orders    None       Deno Etienne, DO 05/04/19 [phone]

## 2019-05-09 DIAGNOSIS — Z1389 Encounter for screening for other disorder: Secondary | ICD-10-CM | POA: Diagnosis not present

## 2019-05-09 DIAGNOSIS — Z23 Encounter for immunization: Secondary | ICD-10-CM | POA: Diagnosis not present

## 2019-05-09 DIAGNOSIS — Z0000 Encounter for general adult medical examination without abnormal findings: Secondary | ICD-10-CM | POA: Diagnosis not present

## 2019-05-20 IMAGING — US US EXTREM LOW VENOUS BILAT
1 series · 13 of 24 positions shown · non-contrast
Comparison: None.

CLINICAL DATA: Positive D-dimer, leg pain and edema



[Series 1: us extrem low venous bilat · 0.08mm/px · 13 of 51 slices shown]
[im 1/51]
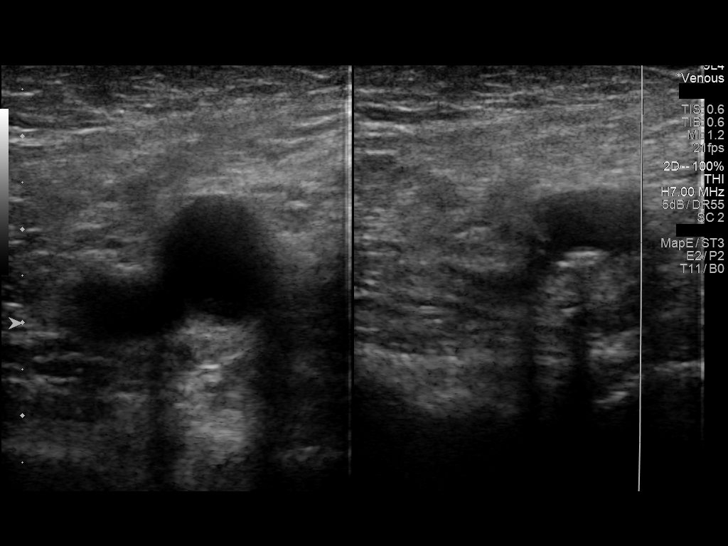
[im 5/51]
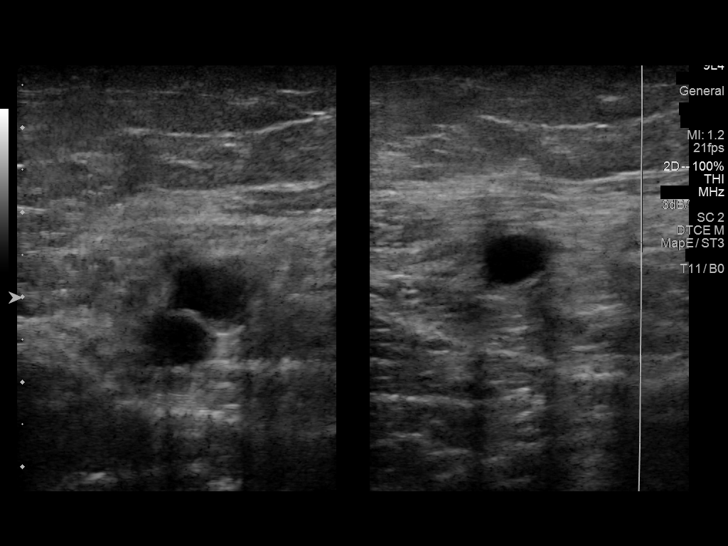
[im 9/51]
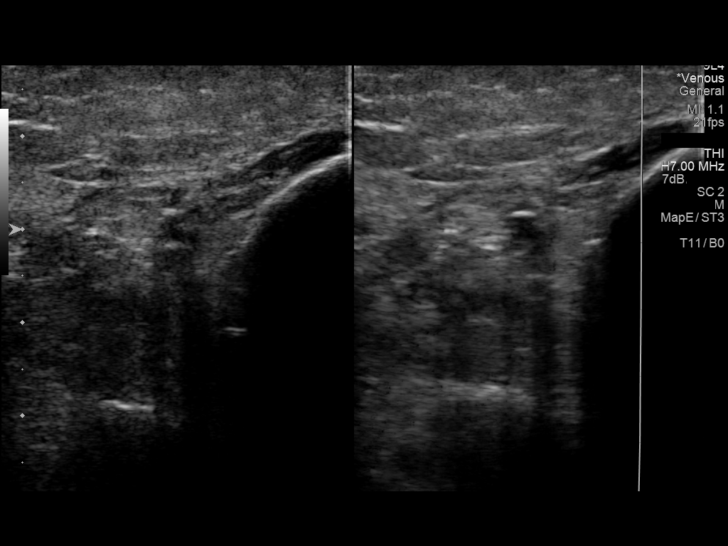
[im 14/51]
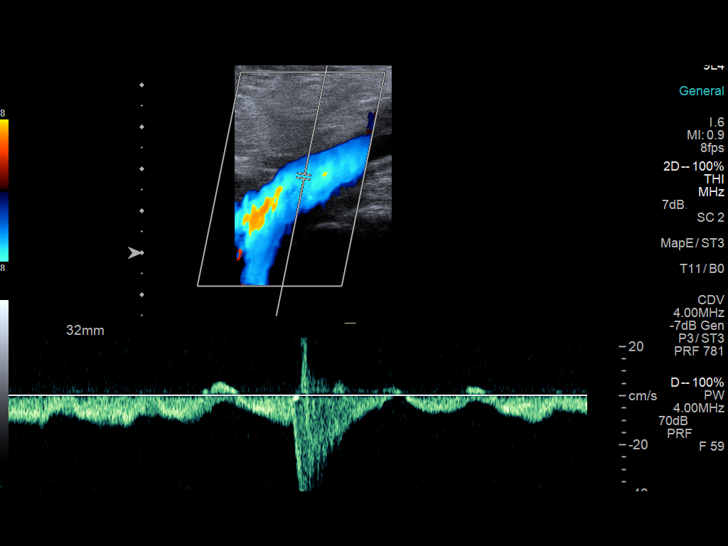
[im 18/51]
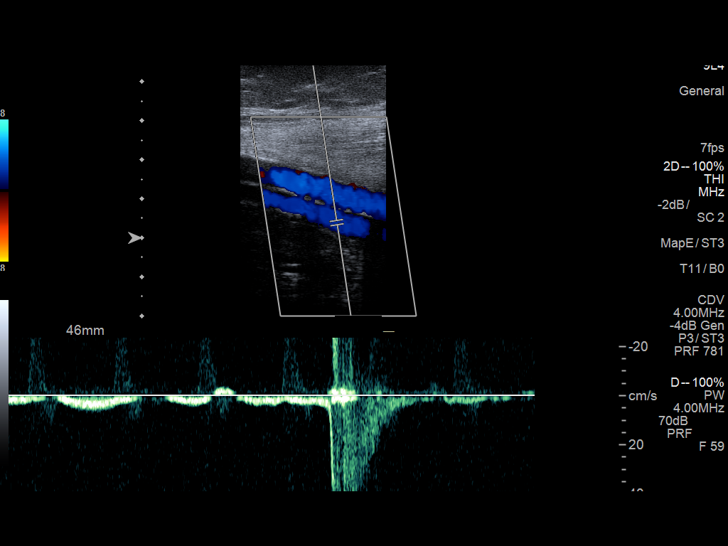
[im 22/51]
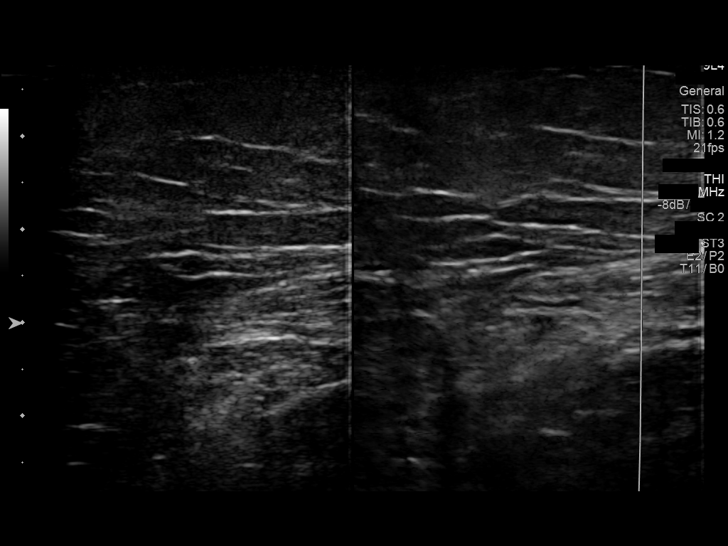
[im 27/51]
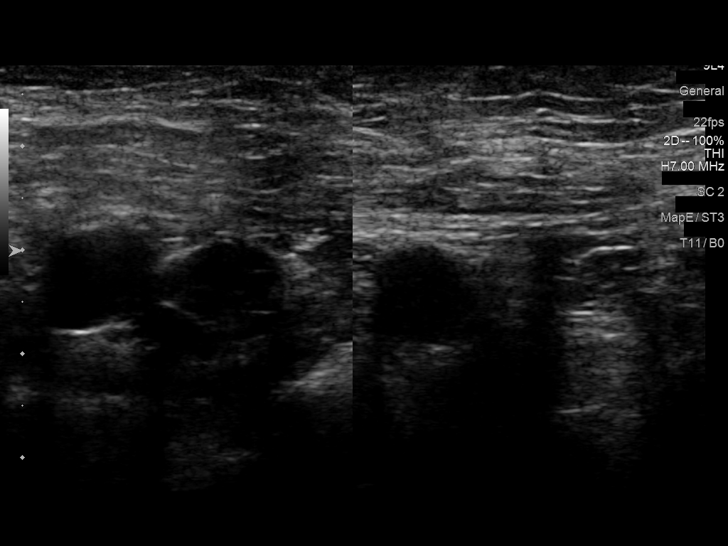
[im 29/51]
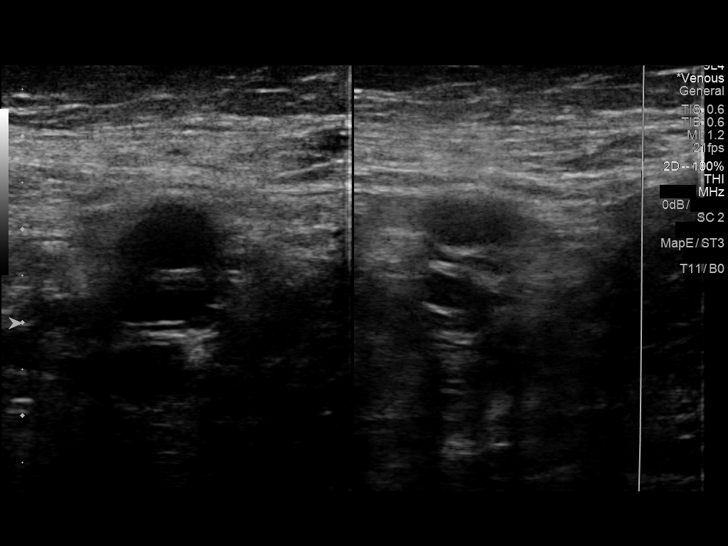
[im 33/51]
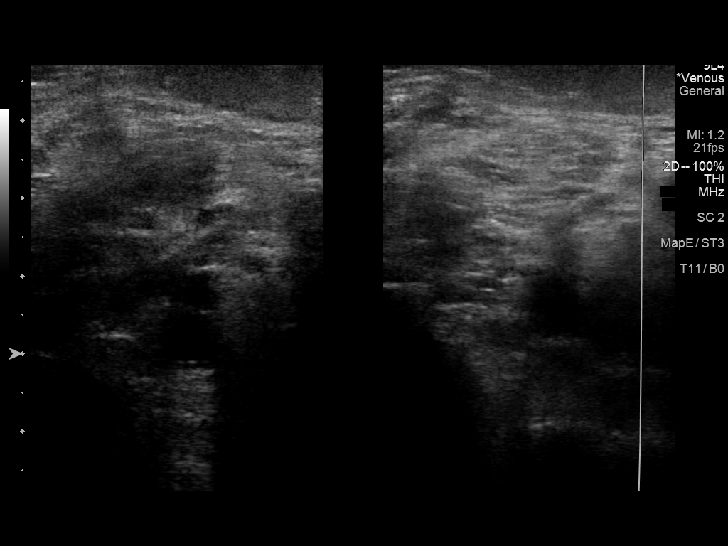
[im 37/51]
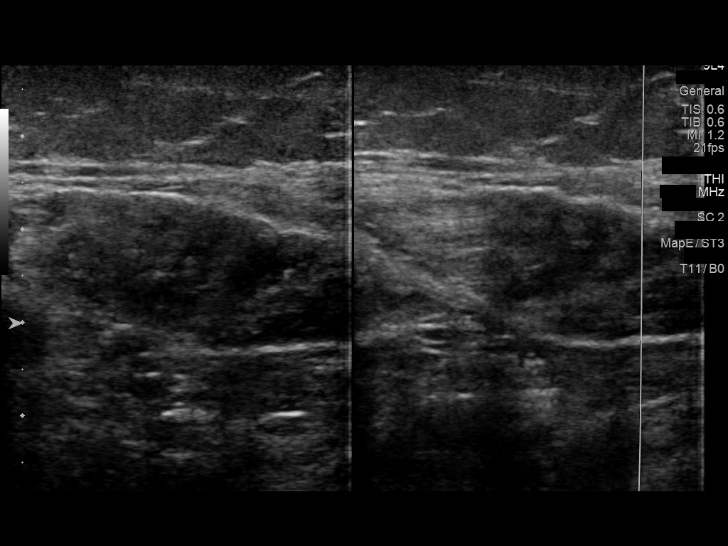
[im 42/51]
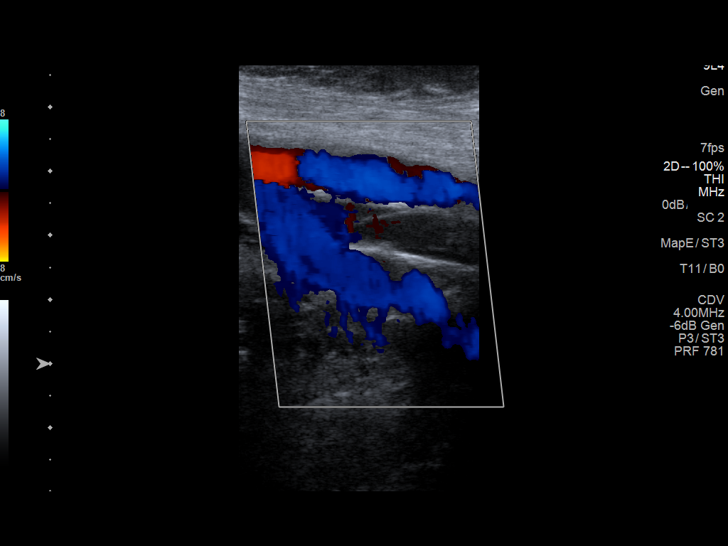
[im 46/51]
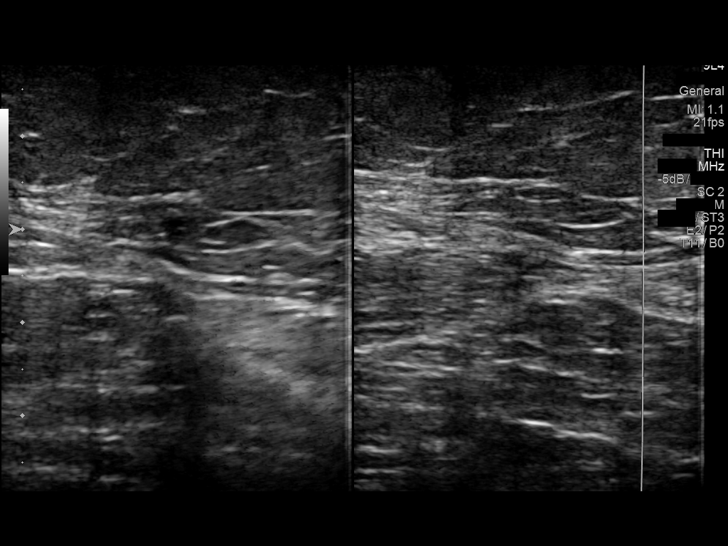
[im 51/51]
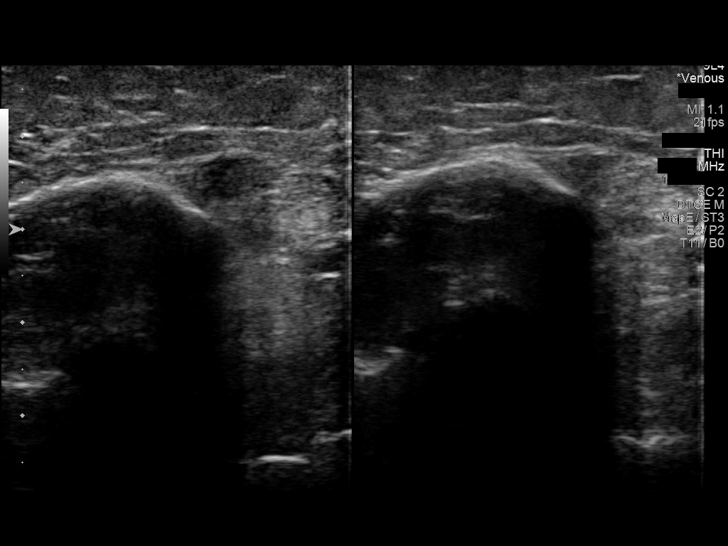

[13 of 24 positions shown; findings below may reference images not displayed]

FINDINGS: RIGHT LOWER EXTREMITY

Common Femoral Vein: No evidence of thrombus. Normal
compressibility, respiratory phasicity and response to augmentation.

Saphenofemoral Junction: No evidence of thrombus. Normal
compressibility and flow on color Doppler imaging.

Profunda Femoral Vein: No evidence of thrombus. Normal
compressibility and flow on color Doppler imaging.

Femoral Vein: No evidence of thrombus. Normal compressibility,
respiratory phasicity and response to augmentation.

Popliteal Vein: No evidence of thrombus. Normal compressibility,
respiratory phasicity and response to augmentation.

Calf Veins: Limited visualization of the calf veins. No gross
thrombus.

Superficial Great Saphenous Vein: No evidence of thrombus. Normal
compressibility.

Venous Reflux:  None.

Other Findings:  None.

LEFT LOWER EXTREMITY

Common Femoral Vein: No evidence of thrombus. Normal
compressibility, respiratory phasicity and response to augmentation.

Saphenofemoral Junction: No evidence of thrombus. Normal
compressibility and flow on color Doppler imaging.

Profunda Femoral Vein: No evidence of thrombus. Normal
compressibility and flow on color Doppler imaging.

Femoral Vein: No evidence of thrombus. Normal compressibility,
respiratory phasicity and response to augmentation.

Popliteal Vein: No evidence of thrombus. Normal compressibility,
respiratory phasicity and response to augmentation.

Calf Veins: Limited visualization of the calf veins. No gross
thrombus.

Superficial Great Saphenous Vein: No evidence of thrombus. Normal
compressibility.

Venous Reflux:  None.

Other Findings:  None.
IMPRESSION: No significant femoropopliteal DVT in either extremity. Limited
assessment of the calf veins.

## 2019-05-22 DIAGNOSIS — W19XXXS Unspecified fall, sequela: Secondary | ICD-10-CM | POA: Diagnosis not present

## 2019-05-22 DIAGNOSIS — I5032 Chronic diastolic (congestive) heart failure: Secondary | ICD-10-CM | POA: Diagnosis not present

## 2019-05-22 DIAGNOSIS — I4891 Unspecified atrial fibrillation: Secondary | ICD-10-CM | POA: Diagnosis not present

## 2019-05-22 DIAGNOSIS — R69 Illness, unspecified: Secondary | ICD-10-CM | POA: Diagnosis not present

## 2019-05-22 DIAGNOSIS — I10 Essential (primary) hypertension: Secondary | ICD-10-CM | POA: Diagnosis not present

## 2019-05-22 DIAGNOSIS — M109 Gout, unspecified: Secondary | ICD-10-CM | POA: Diagnosis not present

## 2019-05-22 DIAGNOSIS — R0781 Pleurodynia: Secondary | ICD-10-CM | POA: Diagnosis not present

## 2019-05-22 DIAGNOSIS — I129 Hypertensive chronic kidney disease with stage 1 through stage 4 chronic kidney disease, or unspecified chronic kidney disease: Secondary | ICD-10-CM | POA: Diagnosis not present

## 2019-06-07 DIAGNOSIS — M810 Age-related osteoporosis without current pathological fracture: Secondary | ICD-10-CM | POA: Diagnosis not present

## 2019-06-07 DIAGNOSIS — M1990 Unspecified osteoarthritis, unspecified site: Secondary | ICD-10-CM | POA: Diagnosis not present

## 2019-06-07 DIAGNOSIS — D649 Anemia, unspecified: Secondary | ICD-10-CM | POA: Diagnosis not present

## 2019-06-07 DIAGNOSIS — J411 Mucopurulent chronic bronchitis: Secondary | ICD-10-CM | POA: Diagnosis not present

## 2019-06-07 DIAGNOSIS — I4891 Unspecified atrial fibrillation: Secondary | ICD-10-CM | POA: Diagnosis not present

## 2019-06-07 DIAGNOSIS — I5032 Chronic diastolic (congestive) heart failure: Secondary | ICD-10-CM | POA: Diagnosis not present

## 2019-06-07 DIAGNOSIS — R69 Illness, unspecified: Secondary | ICD-10-CM | POA: Diagnosis not present

## 2019-06-07 DIAGNOSIS — E782 Mixed hyperlipidemia: Secondary | ICD-10-CM | POA: Diagnosis not present

## 2019-06-07 DIAGNOSIS — I129 Hypertensive chronic kidney disease with stage 1 through stage 4 chronic kidney disease, or unspecified chronic kidney disease: Secondary | ICD-10-CM | POA: Diagnosis not present

## 2019-06-07 DIAGNOSIS — I10 Essential (primary) hypertension: Secondary | ICD-10-CM | POA: Diagnosis not present

## 2019-06-18 DIAGNOSIS — I129 Hypertensive chronic kidney disease with stage 1 through stage 4 chronic kidney disease, or unspecified chronic kidney disease: Secondary | ICD-10-CM | POA: Diagnosis not present

## 2019-06-18 DIAGNOSIS — I480 Paroxysmal atrial fibrillation: Secondary | ICD-10-CM | POA: Diagnosis not present

## 2019-06-18 DIAGNOSIS — N183 Chronic kidney disease, stage 3 unspecified: Secondary | ICD-10-CM | POA: Diagnosis not present

## 2019-06-18 DIAGNOSIS — Z950 Presence of cardiac pacemaker: Secondary | ICD-10-CM | POA: Diagnosis not present

## 2019-06-18 DIAGNOSIS — N2581 Secondary hyperparathyroidism of renal origin: Secondary | ICD-10-CM | POA: Diagnosis not present

## 2019-06-18 DIAGNOSIS — E875 Hyperkalemia: Secondary | ICD-10-CM | POA: Diagnosis not present

## 2019-06-18 DIAGNOSIS — I82409 Acute embolism and thrombosis of unspecified deep veins of unspecified lower extremity: Secondary | ICD-10-CM | POA: Diagnosis not present

## 2019-06-18 DIAGNOSIS — D649 Anemia, unspecified: Secondary | ICD-10-CM | POA: Diagnosis not present

## 2019-06-18 DIAGNOSIS — I5030 Unspecified diastolic (congestive) heart failure: Secondary | ICD-10-CM | POA: Diagnosis not present

## 2019-06-26 ENCOUNTER — Other Ambulatory Visit: Payer: Self-pay

## 2019-06-26 ENCOUNTER — Encounter (HOSPITAL_COMMUNITY): Payer: Self-pay | Admitting: Emergency Medicine

## 2019-06-26 ENCOUNTER — Emergency Department (HOSPITAL_COMMUNITY): Payer: Medicare HMO

## 2019-06-26 ENCOUNTER — Inpatient Hospital Stay (HOSPITAL_COMMUNITY)
Admission: EM | Admit: 2019-06-26 | Discharge: 2019-06-28 | DRG: 152 | Disposition: A | Discharge status: Home Health | Payer: Medicare HMO | Attending: Family Medicine | Admitting: Family Medicine

## 2019-06-26 DIAGNOSIS — E7800 Pure hypercholesterolemia, unspecified: Secondary | ICD-10-CM | POA: Diagnosis present

## 2019-06-26 DIAGNOSIS — R079 Chest pain, unspecified: Secondary | ICD-10-CM | POA: Diagnosis not present

## 2019-06-26 DIAGNOSIS — Z7982 Long term (current) use of aspirin: Secondary | ICD-10-CM

## 2019-06-26 DIAGNOSIS — N183 Chronic kidney disease, stage 3 unspecified: Secondary | ICD-10-CM | POA: Diagnosis present

## 2019-06-26 DIAGNOSIS — J4541 Moderate persistent asthma with (acute) exacerbation: Secondary | ICD-10-CM

## 2019-06-26 DIAGNOSIS — I10 Essential (primary) hypertension: Secondary | ICD-10-CM | POA: Diagnosis present

## 2019-06-26 DIAGNOSIS — Z20822 Contact with and (suspected) exposure to covid-19: Secondary | ICD-10-CM | POA: Diagnosis not present

## 2019-06-26 DIAGNOSIS — K219 Gastro-esophageal reflux disease without esophagitis: Secondary | ICD-10-CM | POA: Diagnosis present

## 2019-06-26 DIAGNOSIS — Z882 Allergy status to sulfonamides status: Secondary | ICD-10-CM

## 2019-06-26 DIAGNOSIS — D539 Nutritional anemia, unspecified: Secondary | ICD-10-CM | POA: Diagnosis present

## 2019-06-26 DIAGNOSIS — I5032 Chronic diastolic (congestive) heart failure: Secondary | ICD-10-CM | POA: Diagnosis present

## 2019-06-26 DIAGNOSIS — Z823 Family history of stroke: Secondary | ICD-10-CM

## 2019-06-26 DIAGNOSIS — Z880 Allergy status to penicillin: Secondary | ICD-10-CM

## 2019-06-26 DIAGNOSIS — Z66 Do not resuscitate: Secondary | ICD-10-CM | POA: Diagnosis present

## 2019-06-26 DIAGNOSIS — R0602 Shortness of breath: Secondary | ICD-10-CM | POA: Diagnosis not present

## 2019-06-26 DIAGNOSIS — D631 Anemia in chronic kidney disease: Secondary | ICD-10-CM | POA: Diagnosis present

## 2019-06-26 DIAGNOSIS — M797 Fibromyalgia: Secondary | ICD-10-CM | POA: Diagnosis present

## 2019-06-26 DIAGNOSIS — Z79899 Other long term (current) drug therapy: Secondary | ICD-10-CM

## 2019-06-26 DIAGNOSIS — Z888 Allergy status to other drugs, medicaments and biological substances status: Secondary | ICD-10-CM

## 2019-06-26 DIAGNOSIS — Z85828 Personal history of other malignant neoplasm of skin: Secondary | ICD-10-CM

## 2019-06-26 DIAGNOSIS — Z950 Presence of cardiac pacemaker: Secondary | ICD-10-CM

## 2019-06-26 DIAGNOSIS — I21A1 Myocardial infarction type 2: Secondary | ICD-10-CM | POA: Diagnosis present

## 2019-06-26 DIAGNOSIS — J069 Acute upper respiratory infection, unspecified: Secondary | ICD-10-CM | POA: Diagnosis not present

## 2019-06-26 DIAGNOSIS — Z8249 Family history of ischemic heart disease and other diseases of the circulatory system: Secondary | ICD-10-CM

## 2019-06-26 DIAGNOSIS — M1990 Unspecified osteoarthritis, unspecified site: Secondary | ICD-10-CM | POA: Diagnosis present

## 2019-06-26 DIAGNOSIS — I272 Pulmonary hypertension, unspecified: Secondary | ICD-10-CM | POA: Diagnosis present

## 2019-06-26 DIAGNOSIS — M109 Gout, unspecified: Secondary | ICD-10-CM | POA: Diagnosis present

## 2019-06-26 DIAGNOSIS — J189 Pneumonia, unspecified organism: Secondary | ICD-10-CM

## 2019-06-26 DIAGNOSIS — Z825 Family history of asthma and other chronic lower respiratory diseases: Secondary | ICD-10-CM

## 2019-06-26 DIAGNOSIS — I480 Paroxysmal atrial fibrillation: Secondary | ICD-10-CM | POA: Diagnosis present

## 2019-06-26 DIAGNOSIS — F329 Major depressive disorder, single episode, unspecified: Secondary | ICD-10-CM | POA: Diagnosis present

## 2019-06-26 DIAGNOSIS — Z809 Family history of malignant neoplasm, unspecified: Secondary | ICD-10-CM

## 2019-06-26 DIAGNOSIS — J9611 Chronic respiratory failure with hypoxia: Secondary | ICD-10-CM | POA: Diagnosis present

## 2019-06-26 DIAGNOSIS — I214 Non-ST elevation (NSTEMI) myocardial infarction: Secondary | ICD-10-CM

## 2019-06-26 DIAGNOSIS — Z7983 Long term (current) use of bisphosphonates: Secondary | ICD-10-CM

## 2019-06-26 DIAGNOSIS — I2510 Atherosclerotic heart disease of native coronary artery without angina pectoris: Secondary | ICD-10-CM | POA: Diagnosis present

## 2019-06-26 DIAGNOSIS — J45909 Unspecified asthma, uncomplicated: Secondary | ICD-10-CM | POA: Diagnosis present

## 2019-06-26 DIAGNOSIS — I130 Hypertensive heart and chronic kidney disease with heart failure and stage 1 through stage 4 chronic kidney disease, or unspecified chronic kidney disease: Secondary | ICD-10-CM | POA: Diagnosis present

## 2019-06-26 DIAGNOSIS — Z96652 Presence of left artificial knee joint: Secondary | ICD-10-CM | POA: Diagnosis present

## 2019-06-26 DIAGNOSIS — Z833 Family history of diabetes mellitus: Secondary | ICD-10-CM

## 2019-06-26 DIAGNOSIS — Z87891 Personal history of nicotine dependence: Secondary | ICD-10-CM

## 2019-06-26 DIAGNOSIS — I4891 Unspecified atrial fibrillation: Secondary | ICD-10-CM | POA: Diagnosis present

## 2019-06-26 DIAGNOSIS — N1832 Chronic kidney disease, stage 3b: Secondary | ICD-10-CM | POA: Diagnosis present

## 2019-06-26 DIAGNOSIS — Z86718 Personal history of other venous thrombosis and embolism: Secondary | ICD-10-CM

## 2019-06-26 LAB — CBC
HCT: 34.4 % — ABNORMAL LOW (ref 36.0–46.0)
Hemoglobin: 10.9 g/dL — ABNORMAL LOW (ref 12.0–15.0)
MCH: 32.6 pg (ref 26.0–34.0)
MCHC: 31.7 g/dL (ref 30.0–36.0)
MCV: 103 fL — ABNORMAL HIGH (ref 80.0–100.0)
Platelets: 119 10*3/uL — ABNORMAL LOW (ref 150–400)
RBC: 3.34 MIL/uL — ABNORMAL LOW (ref 3.87–5.11)
RDW: 13.9 % (ref 11.5–15.5)
WBC: 5.5 10*3/uL (ref 4.0–10.5)
nRBC: 0 % (ref 0.0–0.2)

## 2019-06-26 LAB — BASIC METABOLIC PANEL
Anion gap: 12 (ref 5–15)
BUN: 22 mg/dL (ref 8–23)
CO2: 22 mmol/L (ref 22–32)
Calcium: 9 mg/dL (ref 8.9–10.3)
Chloride: 107 mmol/L (ref 98–111)
Creatinine, Ser: 1.16 mg/dL — ABNORMAL HIGH (ref 0.44–1.00)
GFR calc Af Amer: 48 mL/min — ABNORMAL LOW (ref >60)
GFR calc non Af Amer: 42 mL/min — ABNORMAL LOW (ref >60)
Glucose, Bld: 125 mg/dL — ABNORMAL HIGH (ref 70–99)
Potassium: 4.1 mmol/L (ref 3.5–5.1)
Sodium: 141 mmol/L (ref 135–145)

## 2019-06-26 LAB — TROPONIN I (HIGH SENSITIVITY)
Troponin I (High Sensitivity): 19 ng/L — ABNORMAL HIGH (ref <18)
Troponin I (High Sensitivity): 723 ng/L (ref <18)

## 2019-06-26 MED ORDER — PREDNISONE 20 MG PO TABS
60.0000 mg | ORAL_TABLET | Freq: Once | ORAL | Status: AC
  Administered 2019-06-26: 60 mg via ORAL
  Filled 2019-06-26: qty 3

## 2019-06-26 MED ORDER — ALBUTEROL SULFATE (2.5 MG/3ML) 0.083% IN NEBU
5.0000 mg | INHALATION_SOLUTION | Freq: Once | RESPIRATORY_TRACT | Status: AC
  Administered 2019-06-27: 5 mg via RESPIRATORY_TRACT
  Filled 2019-06-26: qty 6

## 2019-06-26 MED ORDER — IPRATROPIUM BROMIDE 0.02 % IN SOLN
0.5000 mg | Freq: Once | RESPIRATORY_TRACT | Status: AC
  Administered 2019-06-27: 0.5 mg via RESPIRATORY_TRACT
  Filled 2019-06-26: qty 2.5

## 2019-06-26 NOTE — ED Provider Notes (Signed)
[facility] EMERGENCY DEPARTMENT Provider Note   CSN: [id number] Arrival date & time: 06/26/19  1705     History Chief Complaint  Patient presents with  . Shortness of Breath    NIYANA CHESBRO is a 86 y.o. female.  The history is provided by the patient, a relative and a caregiver.  Shortness of Breath Severity:  Moderate Onset quality:  Gradual Duration:  1 day Timing:  Constant Progression:  Improving Chronicity:  New Relieved by: albuterol. Worsened by:  Nothing Associated symptoms: cough, sore throat, sputum production and wheezing   Associated symptoms: no abdominal pain, no chest pain, no fever and no hemoptysis   Patient with history of diastolic CHF, chronic kidney disease, pacemaker placed, DVT, hypertension presents with cough wheezing and shortness of breath.  Much of the history is provided by her granddaughter who is caregiver.  Over the past 24 hours patient's had increasing cough and congestion.  She is coughing up yellow sputum.  She is also had wheezing.  Patient has a history of asthma She is not on home oxygen.  However family was checking her oxygen levels at home and they were low.  She took albuterol with some relief.  She was instructed by her primary physician to go to the ER     Past Medical History:  Diagnosis Date  . Anemia    "I stay anemic"  . Arthritis   . Asthma    no problems recent  . Cataract   . Chronic diastolic CHF (congestive heart failure), NYHA class 2 ([place]) 03/19/2014   Echo 4/19: mod LVH, EF 55-60, no RWMA, Gr 1 DD, MAC, trivial MR, mod LAE, normal RVSF, PASP 19  . CKD (chronic kidney disease), stage III   . Complication of anesthesia    CONFUSED AFTER KNEE SURGERY  . Depression   . Diverticulitis   . DVT (deep venous thrombosis) ([place])    from birth control, left groin  . Essential hypertension   . Fibromyalgia 10 y.a.  . GERD (gastroesophageal reflux disease)   . Gout   . Hx of cardiovascular stress  test    Lexiscan Myoview (8/15): apical attenuation artifact, no ischemia, EF 69% - low risk.  Marland Kitchen Hypercholesteremia   . Odynophagia   . Other abnormal glucose   . PAF (paroxysmal atrial fibrillation) ([place])   . Personal history of other diseases of digestive system   . Pneumonia    hx of  . Presence of permanent cardiac pacemaker   . Sinus bradycardia   . Skin cancer 2014   skin R elbow & face  . Symptomatic bradycardia 06/2016    Patient Active Problem List   Diagnosis Date Noted  . Hip fracture ([place]) 02/10/2018  . Closed comminuted intertrochanteric fracture of left femur ([place]) 02/10/2018  . Pacemaker 07/06/2017  . Chronic respiratory failure with hypoxia ([place]) 06/09/2017  . Pleural effusion associated with pulmonary infection 06/09/2017  . Hyperkalemia 06/09/2017  . Pleuritic chest pain 06/09/2017  . Fever 05/14/2017  . Renal insufficiency   . Constipation 04/25/2017  . Urine finding 04/25/2017  . Posterior rhinorrhea 04/25/2017  . Acute upper respiratory infection 04/25/2017  . Thrombocytopenia ([place]) 01/15/2017  . CAP (community acquired pneumonia) 01/15/2017  . Symptomatic bradycardia 06/30/2016  . Hypertensive heart disease 04/01/2016  . Bradycardia 04/01/2016  . Fall 04/01/2016  . Sinus bradycardia   . CKD (chronic kidney disease), stage III   . Normochromic normocytic anemia   . Essential hypertension   .  PAF (paroxysmal atrial fibrillation) ([place])   . Palpitations 09/18/2014  . Shortness of breath 03/19/2014  . Chronic diastolic CHF (congestive heart failure), NYHA class 2 ([place]) 03/19/2014  . Fatigue 09/11/2013  . Gastroenteritis 07/20/2013  . Abdominal pain 07/20/2013  . Abnormal x-ray of abdomen 07/20/2013  . Diarrhea 07/20/2013  . Generalized weakness 07/20/2013  . Obesity (BMI 30-39.9) 07/20/2013  . Hypercholesteremia   . GERD (gastroesophageal reflux disease)   . Depression   . Asthma   . Gout   . Acute blood loss anemia 04/05/2013  .  Osteoarthritis of left knee 04/04/2013  . Total knee replacement status 04/04/2013  . Pulmonary hypertension ([place]) 03/07/2013  . Anemia, unspecified 03/19/2012  . Unspecified deficiency anemia 03/19/2012  . Dizziness 07/09/2010  . Chest pain 07/30/2009  . HYPERTENSION, UNSPECIFIED 08/19/2008  . PAROXYSMAL ATRIAL FIBRILLATION 08/19/2008  . GASTROESOPHAGEAL REFLUX DISEASE, HX OF 08/19/2008    Past Surgical History:  Procedure Laterality Date  . ABDOMINAL HYSTERECTOMY     partial  . APPENDECTOMY  ~55years ago  . CHOLECYSTECTOMY  10/2005   Dr. Effie Shy  . COLONOSCOPY W/ BIOPSIES  2005, 2011   Dr. Bing Plume, "balloon inside for last one at [facility]"  . INTRAMEDULLARY (IM) NAIL INTERTROCHANTERIC Left 02/10/2018   Procedure: INTRAMEDULLARY (IM) NAIL INTERTROCHANTRIC;  Surgeon: Rod Can, MD;  Location: [facility];  Service: Orthopedics;  Laterality: Left;  . IR THORACENTESIS ASP PLEURAL SPACE W/IMG GUIDE  06/13/2017  . KNEE ARTHROSCOPY Right   . LAMINECTOMY  05/2007   foraminotomy, L4-5 laminectomy  . LARYNGOSCOPY / BRONCHOSCOPY / ESOPHAGOSCOPY  2010   Dr. Benjamine Mola  . PACEMAKER IMPLANT N/A 07/01/2016   Procedure: Pacemaker Implant;  Surgeon: Evans Lance, MD;  Location: [facility];  Service: Cardiovascular;  Laterality: N/A;  . SKIN CANCER EXCISION Right ~2005   r elbow  . TOTAL KNEE ARTHROPLASTY Left 04/04/2013   Procedure: LEFT TOTAL KNEE ARTHROPLASTY;  Surgeon: Tobi Bastos, MD;  Location: [facility];  Service: Orthopedics;  Laterality: Left;  . TUBAL LIGATION    . TUMOR REMOVAL Right ~ 20 years ago   tumor in between toes     OB History   No obstetric history on file.     Family History  Problem Relation Age of Onset  . Heart disease Mother   . Hypertension Mother   . Fainting Mother   . Arrhythmia Mother   . Diabetes Father   . Stroke Father   . Hypertension Father   . Cancer Brother   . Sudden death Brother   . Heart attack Brother   . Cancer Sister     . Diabetes Sister   . Asthma Sister     Social History   Tobacco Use  . Smoking status: Former Smoker    Years: 10.00    Types: Cigarettes    Quit date: 02/08/1973    Years since quitting: 46.4  . Smokeless tobacco: Never Used  Substance Use Topics  . Alcohol use: No  . Drug use: No    Home Medications Prior to Admission medications   Medication Sig Start Date End Date Taking? Authorizing Provider  acetaminophen (TYLENOL) 500 MG tablet Take 2 tablets (1,000 mg total) by mouth every 8 (eight) hours as needed for mild pain or headache. Patient taking differently: Take 500 mg by mouth every 8 (eight) hours as needed for mild pain or headache.  05/16/17   Hongalgi, Lenis Dickinson, MD  albuterol (PROVENTIL HFA;VENTOLIN HFA)  108 (90 BASE) MCG/ACT inhaler Inhale 1 puff into the lungs every 6 (six) hours as needed for wheezing or shortness of breath. 11/13/14   Dorothy Spark, MD  alendronate (FOSAMAX) 70 MG tablet Take 70 mg by mouth once a week. On Friday 02/13/14   [provider]  allopurinol (ZYLOPRIM) 100 MG tablet Take 1 tablet (100 mg total) by mouth daily. 04/01/16   Haney, Yetta Flock A, MD  amLODipine (NORVASC) 2.5 MG tablet Take 1 tablet (2.5 mg total) by mouth daily. Please make overdue appt with Dr. Meda Coffee before anymore refills. 1st attempt 08/09/18   Dorothy Spark, MD  aspirin EC 81 MG tablet Take 81 mg by mouth daily.     [provider]  budesonide-formoterol (SYMBICORT) 80-4.5 MCG/ACT inhaler Inhale 2 puffs into the lungs 2 (two) times daily. 08/02/18   Chesley Mires, MD  famotidine (PEPCID) 20 MG tablet Take 20 mg by mouth 2 (two) times daily.    [provider]  ferrous sulfate 325 (65 FE) MG tablet Take 325 mg by mouth daily.     [provider]  methocarbamol (ROBAXIN) 500 MG tablet Take 500 mg by mouth 2 (two) times daily as needed for muscle spasms.    [provider]  ondansetron (ZOFRAN) 4 MG tablet Take 4 mg by mouth every 8 (eight)  hours as needed for nausea or vomiting.    [provider]  ondansetron (ZOFRAN-ODT) 8 MG disintegrating tablet Take 8 mg by mouth every 8 (eight) hours as needed. 03/26/19   [provider]  pravastatin (PRAVACHOL) 20 MG tablet Take 20 mg by mouth at bedtime.  09/17/14   [provider]  sertraline (ZOLOFT) 50 MG tablet Take 50 mg by mouth daily.  03/15/12   [provider]    Allergies    Amoxicillin, Diltiazem, Diltiazem hcl, Losartan potassium, Potassium-containing compounds, Sulfonamide derivatives, Zetia [ezetimibe], and Penicillins  Review of Systems   Review of Systems  Constitutional: Negative for fever.  HENT: Positive for sore throat.   Respiratory: Positive for cough, sputum production, shortness of breath and wheezing. Negative for hemoptysis.   Cardiovascular: Negative for chest pain.  Gastrointestinal: Negative for abdominal pain.  All other systems reviewed and are negative.   Physical Exam Updated Vital Signs BP (!) 122/110   Pulse 73   Temp 99.3 F (37.4 C) (Oral)   Resp (!) 22   SpO2 100%   Physical Exam CONSTITUTIONAL: Elderly, no acute distress HEAD: Normocephalic/atraumatic EYES: EOMI/PERRL ENMT: Mucous membranes moist, uvula midline, no edema, no erythema, no drooling, no stridor NECK: supple no meningeal signs SPINE/BACK: Kyphotic spine CV: S1/S2 noted LUNGS: Coarse breath sounds bilaterally, no apparent distress ABDOMEN: soft, nontender, no rebound or guarding, bowel sounds noted throughout abdomen GU:no cva tenderness NEURO: Pt is awake/alert/appropriate, moves all extremitiesx4.  No facial droop.  EXTREMITIES: pulses normal/equal, full ROM SKIN: warm, color normal PSYCH: no abnormalities of mood noted, alert and oriented to situation  ED Results / Procedures / Treatments   Labs (all labs ordered are listed, but only abnormal results are displayed) Labs Reviewed  BASIC METABOLIC PANEL - Abnormal; Notable for the  following components:      Result Value   Glucose, Bld 125 (*)    Creatinine, Ser 1.16 (*)    GFR calc non Af Amer 42 (*)    GFR calc Af Amer 48 (*)    All other components within normal limits  CBC - Abnormal; Notable for the  following components:   RBC 3.34 (*)    Hemoglobin 10.9 (*)    HCT 34.4 (*)    MCV 103.0 (*)    Platelets 119 (*)    All other components within normal limits  BRAIN NATRIURETIC PEPTIDE - Abnormal; Notable for the following components:   B Natriuretic Peptide 356.9 (*)    All other components within normal limits  TROPONIN I (HIGH SENSITIVITY) - Abnormal; Notable for the following components:   Troponin I (High Sensitivity) 19 (*)    All other components within normal limits  TROPONIN I (HIGH SENSITIVITY) - Abnormal; Notable for the following components:   Troponin I (High Sensitivity) 723 (*)    All other components within normal limits  TROPONIN I (HIGH SENSITIVITY) - Abnormal; Notable for the following components:   Troponin I (High Sensitivity) 1,054 (*)    All other components within normal limits  SARS CORONAVIRUS 2 (TAT 6-24 HRS)  POC SARS CORONAVIRUS 2 AG -  ED    EKG EKG Interpretation  Date/Time:  Tuesday Jun 26 2019 17:14:20 EDT Ventricular Rate:  82 PR Interval:  158 QRS Duration: 96 QT Interval:  406 QTC Calculation: 474 R Axis:   -68 Text Interpretation: Normal sinus rhythm Left anterior fascicular block Nonspecific ST abnormality Abnormal ECG No significant change since last tracing Confirmed by Ripley Fraise [phone]) on 06/26/2019 11:14:59 PM   Radiology DG Chest 2 View  Result Date: 06/26/2019 CLINICAL DATA:  Congestion, shortness of breath, chest pain EXAM: CHEST - 2 VIEW COMPARISON:  05/03/2019 FINDINGS: Left pacer remains in place, unchanged. Bibasilar atelectasis. Heart is normal size. No effusions or edema. No acute bony abnormality. IMPRESSION: Bibasilar atelectasis. Electronically Signed   By: Rolm Baptise M.D.   On:  06/26/2019 19:22    Procedures .Critical Care Performed by: Ripley Fraise, MD Authorized by: Ripley Fraise, MD   Critical care provider statement:    Critical care time (minutes):  45   Critical care start time:  06/26/2019 11:45 PM   Critical care end time:  06/27/2019 12:30 AM   Critical care time was exclusive of:  Separately billable procedures and treating other patients   Critical care was necessary to treat or prevent imminent or life-threatening deterioration of the following conditions:  Respiratory failure and cardiac failure   Critical care was time spent personally by me on the following activities:  Pulse oximetry, ordering and review of radiographic studies, ordering and review of laboratory studies, re-evaluation of patient's condition, examination of patient, discussions with consultants, development of treatment plan with patient or surrogate, obtaining history from patient or surrogate, evaluation of patient's response to treatment and ordering and performing treatments and interventions   I assumed direction of critical care for this patient from another provider in my specialty: no       Medications Ordered in ED Medications  albuterol (PROVENTIL) (2.5 MG/3ML) 0.083% nebulizer solution 5 mg (5 mg Nebulization Given 06/27/19 0038)  ipratropium (ATROVENT) nebulizer solution 0.5 mg (0.5 mg Nebulization Given 06/27/19 0038)  predniSONE (DELTASONE) tablet 60 mg (60 mg Oral Given 06/26/19 2335)  aspirin chewable tablet 324 mg (324 mg Oral Given 06/27/19 0108)    ED Course  I have reviewed the triage vital signs and the nursing notes.  Pertinent labs & imaging results that were available during my care of the patient were reviewed by me and considered in my medical decision making (see chart for details).    MDM Rules/Calculators/A&P  11:38 PM Patient presents with cough and wheezing over the past day.  She had an episode at home where she was  tachypneic and was hypoxic per family.  Overall she is improved.  Her troponin is elevated, though this might be related to her respiratory distress earlier. No CP and no EKG changes She is currently not hypoxic on room air. We will give a round of albuterol and Atrovent and prednisone and then reassess. Patient reports she is fully vaccinated for COVID-19 1:57 AM Overall patient's clinical picture is more consistent with a respiratory illness.  She had cough, wheezing shortness of breath and yellow sputum.  Initial COVID-19 testing negative.  She denied ever having any chest pain.  However her troponins continue to elevate now above 1000.  No acute EKG changes  I consulted Dr. Marletta Lor with cardiology.  She will evaluate the patient.  However we will admit patient to the medical service.  Discussed with Dr. Hal Hope for admission.  Patient confirms to me that she would proceed with a cardiac catheterization if necessary  X-ray personally reviewed by myself without findings of acute pneumonia. Labs revealed non-STEMI with troponin elevated Suspect non-STEMI could be related to respiratory illness that was very severe at home  KYMORA SCIARA was evaluated in Emergency Department on 06/27/2019 for the symptoms described in the history of present illness. She was evaluated in the context of the global COVID-19 pandemic, which necessitated consideration that the patient might be at risk for infection with the SARS-CoV-2 virus that causes COVID-19. Institutional protocols and algorithms that pertain to the evaluation of patients at risk for COVID-19 are in a state of rapid change based on information released by regulatory bodies including the CDC and federal and state organizations. These policies and algorithms were followed during the patient's care in the ED.  Final Clinical Impression(s) / ED Diagnoses Final diagnoses:  NSTEMI (non-ST elevated myocardial infarction) ([place])  Moderate persistent  asthma with exacerbation    Rx / DC Orders ED Discharge Orders    None       Ripley Fraise, MD 06/27/19 0200

## 2019-06-26 NOTE — ED Triage Notes (Signed)
Pt woke up with congestion, sob and wheezing. Family reports O2 went from 96% to 94%. Pain in throat when coughing. Family treated for bronchitis last week.

## 2019-06-27 ENCOUNTER — Encounter (HOSPITAL_COMMUNITY): Payer: Self-pay | Admitting: Internal Medicine

## 2019-06-27 ENCOUNTER — Other Ambulatory Visit (HOSPITAL_COMMUNITY): Payer: Medicare HMO

## 2019-06-27 DIAGNOSIS — J9611 Chronic respiratory failure with hypoxia: Secondary | ICD-10-CM | POA: Diagnosis not present

## 2019-06-27 DIAGNOSIS — I480 Paroxysmal atrial fibrillation: Secondary | ICD-10-CM | POA: Diagnosis present

## 2019-06-27 DIAGNOSIS — Z950 Presence of cardiac pacemaker: Secondary | ICD-10-CM | POA: Diagnosis not present

## 2019-06-27 DIAGNOSIS — D631 Anemia in chronic kidney disease: Secondary | ICD-10-CM | POA: Diagnosis present

## 2019-06-27 DIAGNOSIS — F329 Major depressive disorder, single episode, unspecified: Secondary | ICD-10-CM | POA: Diagnosis present

## 2019-06-27 DIAGNOSIS — I21A1 Myocardial infarction type 2: Secondary | ICD-10-CM | POA: Diagnosis not present

## 2019-06-27 DIAGNOSIS — M797 Fibromyalgia: Secondary | ICD-10-CM | POA: Diagnosis present

## 2019-06-27 DIAGNOSIS — R079 Chest pain, unspecified: Secondary | ICD-10-CM | POA: Diagnosis not present

## 2019-06-27 DIAGNOSIS — N183 Chronic kidney disease, stage 3 unspecified: Secondary | ICD-10-CM | POA: Diagnosis not present

## 2019-06-27 DIAGNOSIS — R05 Cough: Secondary | ICD-10-CM | POA: Diagnosis not present

## 2019-06-27 DIAGNOSIS — I214 Non-ST elevation (NSTEMI) myocardial infarction: Secondary | ICD-10-CM | POA: Diagnosis present

## 2019-06-27 DIAGNOSIS — J069 Acute upper respiratory infection, unspecified: Secondary | ICD-10-CM | POA: Diagnosis not present

## 2019-06-27 DIAGNOSIS — Z66 Do not resuscitate: Secondary | ICD-10-CM | POA: Diagnosis not present

## 2019-06-27 DIAGNOSIS — D539 Nutritional anemia, unspecified: Secondary | ICD-10-CM | POA: Diagnosis present

## 2019-06-27 DIAGNOSIS — Z86718 Personal history of other venous thrombosis and embolism: Secondary | ICD-10-CM | POA: Diagnosis not present

## 2019-06-27 DIAGNOSIS — E7800 Pure hypercholesterolemia, unspecified: Secondary | ICD-10-CM | POA: Diagnosis present

## 2019-06-27 DIAGNOSIS — I2510 Atherosclerotic heart disease of native coronary artery without angina pectoris: Secondary | ICD-10-CM | POA: Diagnosis present

## 2019-06-27 DIAGNOSIS — I272 Pulmonary hypertension, unspecified: Secondary | ICD-10-CM | POA: Diagnosis present

## 2019-06-27 DIAGNOSIS — Z20822 Contact with and (suspected) exposure to covid-19: Secondary | ICD-10-CM | POA: Diagnosis not present

## 2019-06-27 DIAGNOSIS — K219 Gastro-esophageal reflux disease without esophagitis: Secondary | ICD-10-CM | POA: Diagnosis present

## 2019-06-27 DIAGNOSIS — J4541 Moderate persistent asthma with (acute) exacerbation: Secondary | ICD-10-CM | POA: Diagnosis not present

## 2019-06-27 DIAGNOSIS — M1990 Unspecified osteoarthritis, unspecified site: Secondary | ICD-10-CM | POA: Diagnosis not present

## 2019-06-27 DIAGNOSIS — I130 Hypertensive heart and chronic kidney disease with heart failure and stage 1 through stage 4 chronic kidney disease, or unspecified chronic kidney disease: Secondary | ICD-10-CM | POA: Diagnosis not present

## 2019-06-27 DIAGNOSIS — Z96652 Presence of left artificial knee joint: Secondary | ICD-10-CM | POA: Diagnosis present

## 2019-06-27 DIAGNOSIS — M109 Gout, unspecified: Secondary | ICD-10-CM | POA: Diagnosis present

## 2019-06-27 DIAGNOSIS — R0602 Shortness of breath: Secondary | ICD-10-CM | POA: Diagnosis not present

## 2019-06-27 DIAGNOSIS — I5032 Chronic diastolic (congestive) heart failure: Secondary | ICD-10-CM | POA: Diagnosis not present

## 2019-06-27 DIAGNOSIS — I361 Nonrheumatic tricuspid (valve) insufficiency: Secondary | ICD-10-CM | POA: Diagnosis not present

## 2019-06-27 LAB — COMPREHENSIVE METABOLIC PANEL
ALT: 22 U/L (ref 0–44)
AST: 36 U/L (ref 15–41)
Albumin: 3.6 g/dL (ref 3.5–5.0)
Alkaline Phosphatase: 83 U/L (ref 38–126)
Anion gap: 11 (ref 5–15)
BUN: 23 mg/dL (ref 8–23)
CO2: 22 mmol/L (ref 22–32)
Calcium: 8.8 mg/dL — ABNORMAL LOW (ref 8.9–10.3)
Chloride: 105 mmol/L (ref 98–111)
Creatinine, Ser: 1.31 mg/dL — ABNORMAL HIGH (ref 0.44–1.00)
GFR calc Af Amer: 42 mL/min — ABNORMAL LOW (ref >60)
GFR calc non Af Amer: 36 mL/min — ABNORMAL LOW (ref >60)
Glucose, Bld: 186 mg/dL — ABNORMAL HIGH (ref 70–99)
Potassium: 4.8 mmol/L (ref 3.5–5.1)
Sodium: 138 mmol/L (ref 135–145)
Total Bilirubin: 1.1 mg/dL (ref 0.3–1.2)
Total Protein: 5.7 g/dL — ABNORMAL LOW (ref 6.5–8.1)

## 2019-06-27 LAB — CBC WITH DIFFERENTIAL/PLATELET
Abs Immature Granulocytes: 0.01 10*3/uL (ref 0.00–0.07)
Basophils Absolute: 0 10*3/uL (ref 0.0–0.1)
Basophils Relative: 0 %
Eosinophils Absolute: 0 10*3/uL (ref 0.0–0.5)
Eosinophils Relative: 0 %
HCT: 30 % — ABNORMAL LOW (ref 36.0–46.0)
Hemoglobin: 9.6 g/dL — ABNORMAL LOW (ref 12.0–15.0)
Immature Granulocytes: 0 %
Lymphocytes Relative: 4 %
Lymphs Abs: 0.2 10*3/uL — ABNORMAL LOW (ref 0.7–4.0)
MCH: 32.8 pg (ref 26.0–34.0)
MCHC: 32 g/dL (ref 30.0–36.0)
MCV: 102.4 fL — ABNORMAL HIGH (ref 80.0–100.0)
Monocytes Absolute: 0.1 10*3/uL (ref 0.1–1.0)
Monocytes Relative: 2 %
Neutro Abs: 4.9 10*3/uL (ref 1.7–7.7)
Neutrophils Relative %: 94 %
Platelets: 124 10*3/uL — ABNORMAL LOW (ref 150–400)
RBC: 2.93 MIL/uL — ABNORMAL LOW (ref 3.87–5.11)
RDW: 13.8 % (ref 11.5–15.5)
WBC: 5.3 10*3/uL (ref 4.0–10.5)
nRBC: 0 % (ref 0.0–0.2)

## 2019-06-27 LAB — BRAIN NATRIURETIC PEPTIDE: B Natriuretic Peptide: 356.9 pg/mL — ABNORMAL HIGH (ref 0.0–100.0)

## 2019-06-27 LAB — TROPONIN I (HIGH SENSITIVITY)
Troponin I (High Sensitivity): 1054 ng/L (ref <18)
Troponin I (High Sensitivity): 951 ng/L (ref <18)
Troponin I (High Sensitivity): 872 ng/L (ref <18)

## 2019-06-27 LAB — POC SARS CORONAVIRUS 2 AG -  ED: SARS Coronavirus 2 Ag: NEGATIVE

## 2019-06-27 LAB — SARS CORONAVIRUS 2 (TAT 6-24 HRS): SARS Coronavirus 2: NEGATIVE

## 2019-06-27 LAB — MAGNESIUM: Magnesium: 1.5 mg/dL — ABNORMAL LOW (ref 1.7–2.4)

## 2019-06-27 MED ORDER — SODIUM CHLORIDE 0.9 % IV SOLN
100.0000 mg | Freq: Two times a day (BID) | INTRAVENOUS | Status: DC
  Administered 2019-06-27: 100 mg via INTRAVENOUS
  Filled 2019-06-27 (×3): qty 100

## 2019-06-27 MED ORDER — ONDANSETRON HCL 4 MG PO TABS: 4.0000 mg | ORAL_TABLET | Freq: Four times a day (QID) | ORAL | Status: DC | PRN

## 2019-06-27 MED ORDER — BUDESONIDE 0.25 MG/2ML IN SUSP
0.2500 mg | Freq: Two times a day (BID) | RESPIRATORY_TRACT | Status: DC
  Administered 2019-06-27: 0.25 mg via RESPIRATORY_TRACT
  Filled 2019-06-27 (×2): qty 2

## 2019-06-27 MED ORDER — ENOXAPARIN SODIUM 40 MG/0.4ML ~~LOC~~ SOLN
40.0000 mg | SUBCUTANEOUS | Status: DC
  Administered 2019-06-27 – 2019-06-28 (×2): 40 mg via SUBCUTANEOUS
  Filled 2019-06-27 (×2): qty 0.4

## 2019-06-27 MED ORDER — VITAMIN D 25 MCG (1000 UNIT) PO TABS
1000.0000 [IU] | ORAL_TABLET | Freq: Every day | ORAL | Status: DC
  Administered 2019-06-27 – 2019-06-28 (×2): 1000 [IU] via ORAL
  Filled 2019-06-27 (×4): qty 1

## 2019-06-27 MED ORDER — GUAIFENESIN-DM 100-10 MG/5ML PO SYRP
5.0000 mL | ORAL_SOLUTION | ORAL | Status: DC | PRN
  Administered 2019-06-27: 5 mL via ORAL
  Filled 2019-06-27: qty 5

## 2019-06-27 MED ORDER — MAGNESIUM SULFATE 2 GM/50ML IV SOLN
2.0000 g | Freq: Once | INTRAVENOUS | Status: AC
  Administered 2019-06-27: 2 g via INTRAVENOUS
  Filled 2019-06-27: qty 50

## 2019-06-27 MED ORDER — MELATONIN 3 MG PO TABS
3.0000 mg | ORAL_TABLET | Freq: Every day | ORAL | Status: DC
  Administered 2019-06-28: 3 mg via ORAL
  Filled 2019-06-27: qty 1

## 2019-06-27 MED ORDER — ONDANSETRON HCL 4 MG/2ML IJ SOLN: 4.0000 mg | Freq: Four times a day (QID) | INTRAMUSCULAR | Status: DC | PRN

## 2019-06-27 MED ORDER — ALBUTEROL SULFATE (2.5 MG/3ML) 0.083% IN NEBU
2.5000 mg | INHALATION_SOLUTION | RESPIRATORY_TRACT | Status: DC
  Administered 2019-06-27 (×2): 2.5 mg via RESPIRATORY_TRACT
  Filled 2019-06-27 (×2): qty 3

## 2019-06-27 MED ORDER — GUAIFENESIN-DM 100-10 MG/5ML PO SYRP
10.0000 mL | ORAL_SOLUTION | ORAL | Status: DC | PRN
  Administered 2019-06-28: 10 mL via ORAL
  Filled 2019-06-27: qty 10

## 2019-06-27 MED ORDER — BENZONATATE 100 MG PO CAPS
100.0000 mg | ORAL_CAPSULE | Freq: Two times a day (BID) | ORAL | Status: DC | PRN
  Administered 2019-06-27: 100 mg via ORAL
  Filled 2019-06-27: qty 1

## 2019-06-27 MED ORDER — HYDRALAZINE HCL 20 MG/ML IJ SOLN: 10.0000 mg | INTRAMUSCULAR | Status: DC | PRN

## 2019-06-27 MED ORDER — FUROSEMIDE 10 MG/ML IJ SOLN
40.0000 mg | Freq: Once | INTRAMUSCULAR | Status: AC
  Administered 2019-06-27: 40 mg via INTRAVENOUS
  Filled 2019-06-27: qty 4

## 2019-06-27 MED ORDER — ALBUTEROL SULFATE (2.5 MG/3ML) 0.083% IN NEBU
2.5000 mg | INHALATION_SOLUTION | RESPIRATORY_TRACT | Status: DC | PRN
  Administered 2019-06-27: 2.5 mg via RESPIRATORY_TRACT
  Filled 2019-06-27: qty 3

## 2019-06-27 MED ORDER — MENTHOL 3 MG MT LOZG
1.0000 | LOZENGE | OROMUCOSAL | Status: DC | PRN
  Administered 2019-06-28: 3 mg via ORAL
  Filled 2019-06-27: qty 9

## 2019-06-27 MED ORDER — ASPIRIN EC 81 MG PO TBEC: 81.0000 mg | DELAYED_RELEASE_TABLET | Freq: Every day | ORAL | Status: DC

## 2019-06-27 MED ORDER — ASPIRIN 81 MG PO CHEW
324.0000 mg | CHEWABLE_TABLET | Freq: Once | ORAL | Status: AC
  Administered 2019-06-27: 324 mg via ORAL
  Filled 2019-06-27: qty 4

## 2019-06-27 MED ORDER — ASPIRIN EC 81 MG PO TBEC
81.0000 mg | DELAYED_RELEASE_TABLET | Freq: Every day | ORAL | Status: DC
  Administered 2019-06-27 – 2019-06-28 (×2): 81 mg via ORAL
  Filled 2019-06-27 (×2): qty 1

## 2019-06-27 NOTE — Progress Notes (Signed)
Progress Note  Patient Name: [name] Date of Encounter: 06/27/2019  Primary Cardiologist: Ena Dawley, MD   Subjective   She reports improvement in her breathing. Still coughing. No complaints of chest pain, palpitations, or LE edema.  Inpatient Medications    Scheduled Meds: . albuterol  2.5 mg Nebulization Q4H  . aspirin EC  81 mg Oral Daily  . budesonide (PULMICORT) nebulizer solution  0.25 mg Nebulization BID  . enoxaparin (LOVENOX) injection  40 mg Subcutaneous Q24H   Continuous Infusions:  PRN Meds: albuterol, hydrALAZINE, ondansetron **OR** ondansetron (ZOFRAN) IV   Vital Signs    Vitals:   06/27/19 0718 06/27/19 0800 06/27/19 0900 06/27/19 1002  BP: (!) 109/50 97/65 117/63 (!) 117/55  Pulse: 72 79 79 71  Resp: 15 16 15 14   Temp:      TempSrc:      SpO2: 96% 100% 98% 98%   No intake or output data in the 24 hours ending 06/27/19 1033 There were no vitals filed for this visit.  Telemetry    NSR with intermittent brief pacing - Personally Reviewed  ECG    No new tracings. - Personally Reviewed  Physical Exam   GEN: Elderly female sitting upright in bed in no acute distress.   Neck: No JVD, no carotid bruits Cardiac: RRR, no murmurs, rubs, or gallops.  Respiratory: Clear to auscultation bilaterally, no wheezes/ rales/ rhonchi; coughing during deep inspiration GI: NABS, Soft, nontender, non-distended  MS: No edema; No deformity. Neuro:  Nonfocal, moving all extremities spontaneously Psych: Normal affect   Labs    Chemistry Recent Labs  Lab 06/26/19 1720 06/27/19 0410  NA 141 138  K 4.1 4.8  CL 107 105  CO2 22 22  GLUCOSE 125* 186*  BUN 22 23  CREATININE 1.16* 1.31*  CALCIUM 9.0 8.8*  PROT  --  5.7*  ALBUMIN  --  3.6  AST  --  36  ALT  --  22  ALKPHOS  --  83  BILITOT  --  1.1  GFRNONAA 42* 36*  GFRAA 48* 42*  ANIONGAP 12 11     Hematology Recent Labs  Lab 06/26/19 1720 06/27/19 0410  WBC 5.5 5.3  RBC 3.34*  2.93*  HGB 10.9* 9.6*  HCT 34.4* 30.0*  MCV 103.0* 102.4*  MCH 32.6 32.8  MCHC 31.7 32.0  RDW 13.9 13.8  PLT 119* 124*    Cardiac EnzymesNo results for input(s): TROPONINI in the last 168 hours. No results for input(s): TROPIPOC in the last 168 hours.   BNP Recent Labs  Lab 06/26/19 2335  BNP 356.9*     DDimer No results for input(s): DDIMER in the last 168 hours.   Radiology    DG Chest 2 View  Result Date: 06/26/2019 CLINICAL DATA:  Congestion, shortness of breath, chest pain EXAM: CHEST - 2 VIEW COMPARISON:  05/03/2019 FINDINGS: Left pacer remains in place, unchanged. Bibasilar atelectasis. Heart is normal size. No effusions or edema. No acute bony abnormality. IMPRESSION: Bibasilar atelectasis. Electronically Signed   By: Rolm Baptise M.D.   On: 06/26/2019 19:22    Cardiac Studies   Echocardiogram 2019: - Left ventricle: The cavity size was normal. Wall thickness was  increased in a pattern of moderate LVH. Systolic function was  normal. The estimated ejection fraction was in the range of 55%  to 60%. Wall motion was normal; there were no regional wall  motion abnormalities. Doppler parameters are consistent with  abnormal left ventricular  relaxation (grade 1 diastolic  dysfunction).  - Aortic valve: There was no stenosis.  - Mitral valve: Mildly calcified annulus. There was trivial  regurgitation.  - Left atrium: The atrium was moderately dilated.  - Right ventricle: The cavity size was normal. Pacer wire or  catheter noted in right ventricle. Systolic function was normal.  - Tricuspid valve: Peak RV-RA gradient (S): 16 mm Hg.  - Pulmonary arteries: PA peak pressure: 19 mm Hg (S).  - Inferior vena cava: The vessel was normal in size. The  respirophasic diameter changes were in the normal range (>= 50%),  consistent with normal central venous pressure.   Impressions:   - Normal LV size with moderate LV hypertrophy. EF 55-60%. Normal RV    size and systolic function. No significant valvular  abnormalities.   Patient Profile     86 y.o.femalewith history of chronic CHF (diastolic), sinus node dysfunction w/PPM, Afib not on AC, PVCs HTN, HLD, CKD (III), p.HTN, h/o pleural effusion requiring thora May 2019, who presented with SOB, and was found to have elevated troponins   Assessment & Plan    1. Elevated troponins: patient presented with SOB and cough. Reported to have had multiple sick contacts recently. HsTrop peaked at 1054 and trended down. Suspected to be demand ischemia in the setting of a suspected viral URI. - If no significant change on echo, do not anticipate further inpatient cardiac testing - Could consider an outpatient stress test   2. Chronic diastolic CHF: She appears euvolemic on exam. BNP is mildly elevated.  - Continue to monitor daily weights and strict I&Os   For questions or updates, please contact [place] Please consult [email or web address] for contact info under Cardiology/STEMI.      Signed, Abigail Butts, PA-C  06/27/2019, 10:33 AM   [phone]

## 2019-06-27 NOTE — Progress Notes (Signed)
[facility] TEAM 1 - Stepdown/ICU TEAM  SHAKEEMA LIPPMAN  BZJ:[id number] DOB: [birth year] DOA: 06/26/2019 PCP: [name], MD    Brief Narrative:  86 year old with a history of paroxysmal atrial fibrillation, sinus node dysfunction status post pacemaker, diastolic CHF, CKD stage III, HTN, anemia, and asthma who presented to the ED with shortness of breath for 3-4 weeks which have been progressively worsening.  In the ED she was found to have an elevated troponin.  Significant Events: 5/19 admit via [place] for shortness of breath with elevated troponin  Subjective: Pt is seen for a f/u visit.    Assessment & Plan:  Possible URI CXR without evidence of pneumonia/clear infiltrate - no elevated WBC - afebrile - clinically does not appear to have an active infection   NSTEMI  Most likely demand ischemia  Hypomagnesemia  Chronic diastolic CHF Some peripheral edema - f/u TTE  HTN  Depression  CKD stage III  Chronic macrocytic anemia  DVT prophylaxis: Lovenox Code Status: NO CODE BLUE Family Communication: No family present at time of exam Disposition Plan: Admit to progressive care unit  Consultants:  See [facility] cardiology  Is Dr. Thereasa Solo Antimicrobials:  Doxycycline 5/18  Objective: Blood pressure (!) 109/50, pulse 72, temperature 99.3 F (37.4 C), temperature source Oral, resp. rate 15, SpO2 96 %. No intake or output data in the 24 hours ending 06/27/19 0859 There were no vitals filed for this visit.  Examination: Pt was seen for a f/u visit.    CBC: Recent Labs  Lab 06/26/19 1720 06/27/19 0410  WBC 5.5 5.3  NEUTROABS  --  4.9  HGB 10.9* 9.6*  HCT 34.4* 30.0*  MCV 103.0* 102.4*  PLT 119* 017*   Basic Metabolic Panel: Recent Labs  Lab 06/26/19 1720 06/27/19 0410  NA 141 138  K 4.1 4.8  CL 107 105  CO2 22 22  GLUCOSE 125* 186*  BUN 22 23  CREATININE 1.16* 1.31*  CALCIUM 9.0 8.8*  MG  --  1.5*   GFR: CrCl cannot be calculated (Unknown  ideal weight.).  Liver Function Tests: Recent Labs  Lab 06/27/19 0410  AST 36  ALT 22  ALKPHOS 83  BILITOT 1.1  PROT 5.7*  ALBUMIN 3.6   No results for input(s): LIPASE, AMYLASE in the last 168 hours. No results for input(s): AMMONIA in the last 168 hours.  Coagulation Profile: No results for input(s): INR, PROTIME in the last 168 hours.  Cardiac Enzymes: No results for input(s): CKTOTAL, CKMB, CKMBINDEX, TROPONINI in the last 168 hours.  HbA1C: Hgb A1c MFr Bld  Date/Time Value Ref Range Status  06/07/2008 04:05 AM  4.6 - 6.1 % Final   6.1 (NOTE) The ADA recommends the following therapeutic goal for glycemic control related to Hgb A1c measurement: Goal of therapy: <6.5 Hgb A1c  Reference: American Diabetes Association: Clinical Practice Recommendations 2010, Diabetes Care, 2010, 33: (Suppl  1).    CBG: No results for input(s): GLUCAP in the last 168 hours.  Recent Results (from the past 240 hour(s))  SARS CORONAVIRUS 2 (TAT 6-24 HRS) Nasopharyngeal Nasopharyngeal Swab     Status: None   Collection Time: 06/27/19  1:58 AM   Specimen: Nasopharyngeal Swab  Result Value Ref Range Status   SARS Coronavirus 2 NEGATIVE NEGATIVE Final    Comment: (NOTE) SARS-CoV-2 target nucleic acids are NOT DETECTED. The SARS-CoV-2 RNA is generally detectable in upper and lower respiratory specimens during the acute phase of infection. Negative results do not preclude  SARS-CoV-2 infection, do not rule out co-infections with other pathogens, and should not be used as the sole basis for treatment or other patient management decisions. Negative results must be combined with clinical observations, patient history, and epidemiological information. The expected result is Negative. Fact Sheet for Patients: SugarRoll.be Fact Sheet for Healthcare Providers: https://www.woods-mathews.com/ This test is not yet approved or cleared by the [place] FDA  and  has been authorized for detection and/or diagnosis of SARS-CoV-2 by FDA under an Emergency Use Authorization (EUA). This EUA will remain  in effect (meaning this test can be used) for the duration of the COVID-19 declaration under Section 56 4(b)(1) of the Act, 21 U.S.C. section 360bbb-3(b)(1), unless the authorization is terminated or revoked sooner. Performed at [facility], [place] [address], [place], [place] [address]      Scheduled Meds: . albuterol  2.5 mg Nebulization Q4H  . aspirin EC  81 mg Oral Daily  . budesonide (PULMICORT) nebulizer solution  0.25 mg Nebulization BID  . enoxaparin (LOVENOX) injection  40 mg Subcutaneous Q24H   Continuous Infusions: . doxycycline (VIBRAMYCIN) IV Stopped (06/27/19 0657)     LOS: 0 days   Time spent: No Charge  Cherene Altes, MD [facility] Office  [phone] Pager - Text Page per Amion as per below:  On-Call/Text Page:      [email or web address]  If 7PM-7AM, please contact night-coverage [email or web address] 06/27/2019, 8:59 AM

## 2019-06-27 NOTE — Consult Note (Signed)
Cardiology Admission History and Physical:   Patient ID: [name] MRN: [id number]; DOB: 27-Nov-1927   Admission date: 06/26/2019  Primary Care Provider: [name], MD Primary Cardiologist: Ena Dawley, MD  Primary Electrophysiologist:  Cristopher Peru, MD   Chief Complaint:  cough  Patient Profile:   [name] is a 86 y.o. female with history of chronic CHF (diastolic), sinus node dysfunction w/PPM, Afib not on AC, PVCs HTN, HLD, CKD (III), p.HTN, h/o pleural effusion requiring thora May 2019.  History of Present Illness:   [name] presents today with 24 hours of worsening cough productive of yellow sputum and shortness of breath. She notes that she began to cough yesterday afternoon after other members of her family had also experienced URI symptoms. She awoke this morning and said that she could barely catch her breath. The family checked her SpO2 at home and noted her to be hypoxic to low 90s for which they brought her to the ER for evaluation. She denies having associated chest pain or palpitations. She hasn't noticed fevers.   Upon arrival patient normotensive, afebrile, 99% on RA. CXR clear with no effusions or edema. EKG non-ischemic, but hsTn 19 -> 723 -> 1054.   Upon my evaluation, patient resting comfortably. Denying chest pain. Still having productive cough.   Past Medical History:  Diagnosis Date  . Anemia    "I stay anemic"  . Arthritis   . Asthma    no problems recent  . Cataract   . Chronic diastolic CHF (congestive heart failure), NYHA class 2 ([place]) 03/19/2014   Echo 4/19: mod LVH, EF 55-60, no RWMA, Gr 1 DD, MAC, trivial MR, mod LAE, normal RVSF, PASP 19  . CKD (chronic kidney disease), stage III   . Complication of anesthesia    CONFUSED AFTER KNEE SURGERY  . Depression   . Diverticulitis   . DVT (deep venous thrombosis) ([place])    from birth control, left groin  . Essential hypertension   . Fibromyalgia 10 y.a.  . GERD  (gastroesophageal reflux disease)   . Gout   . Hx of cardiovascular stress test    Lexiscan Myoview (8/15): apical attenuation artifact, no ischemia, EF 69% - low risk.  Marland Kitchen Hypercholesteremia   . Odynophagia   . Other abnormal glucose   . PAF (paroxysmal atrial fibrillation) ([place])   . Personal history of other diseases of digestive system   . Pneumonia    hx of  . Presence of permanent cardiac pacemaker   . Sinus bradycardia   . Skin cancer 2014   skin R elbow & face  . Symptomatic bradycardia 06/2016    Past Surgical History:  Procedure Laterality Date  . ABDOMINAL HYSTERECTOMY     partial  . APPENDECTOMY  ~55years ago  . CHOLECYSTECTOMY  10/2005   Dr. Effie Shy  . COLONOSCOPY W/ BIOPSIES  2005, 2011   Dr. Bing Plume, "balloon inside for last one at [facility]"  . INTRAMEDULLARY (IM) NAIL INTERTROCHANTERIC Left 02/10/2018   Procedure: INTRAMEDULLARY (IM) NAIL INTERTROCHANTRIC;  Surgeon: Rod Can, MD;  Location: [facility];  Service: Orthopedics;  Laterality: Left;  . IR THORACENTESIS ASP PLEURAL SPACE W/IMG GUIDE  06/13/2017  . KNEE ARTHROSCOPY Right   . LAMINECTOMY  05/2007   foraminotomy, L4-5 laminectomy  . LARYNGOSCOPY / BRONCHOSCOPY / ESOPHAGOSCOPY  2010   Dr. Benjamine Mola  . PACEMAKER IMPLANT N/A 07/01/2016   Procedure: Pacemaker Implant;  Surgeon: Evans Lance, MD;  Location: [facility];  Service: Cardiovascular;  Laterality: N/A;  . SKIN CANCER EXCISION Right ~2005   r elbow  . TOTAL KNEE ARTHROPLASTY Left 04/04/2013   Procedure: LEFT TOTAL KNEE ARTHROPLASTY;  Surgeon: Tobi Bastos, MD;  Location: [facility];  Service: Orthopedics;  Laterality: Left;  . TUBAL LIGATION    . TUMOR REMOVAL Right ~ 20 years ago   tumor in between toes     Medications Prior to Admission: Prior to Admission medications   Medication Sig Start Date End Date Taking? Authorizing Provider  acetaminophen (TYLENOL) 500 MG tablet Take 2 tablets (1,000 mg total) by mouth every 8 (eight)  hours as needed for mild pain or headache. Patient taking differently: Take 500 mg by mouth every 8 (eight) hours as needed for mild pain or headache.  05/16/17   Hongalgi, Lenis Dickinson, MD  albuterol (PROVENTIL HFA;VENTOLIN HFA) 108 (90 BASE) MCG/ACT inhaler Inhale 1 puff into the lungs every 6 (six) hours as needed for wheezing or shortness of breath. 11/13/14   Dorothy Spark, MD  alendronate (FOSAMAX) 70 MG tablet Take 70 mg by mouth once a week. On Friday 02/13/14   [provider]  allopurinol (ZYLOPRIM) 100 MG tablet Take 1 tablet (100 mg total) by mouth daily. 04/01/16   Haney, Yetta Flock A, MD  amLODipine (NORVASC) 2.5 MG tablet Take 1 tablet (2.5 mg total) by mouth daily. Please make overdue appt with Dr. Meda Coffee before anymore refills. 1st attempt 08/09/18   Dorothy Spark, MD  aspirin EC 81 MG tablet Take 81 mg by mouth daily.     [provider]  budesonide-formoterol (SYMBICORT) 80-4.5 MCG/ACT inhaler Inhale 2 puffs into the lungs 2 (two) times daily. 08/02/18   Chesley Mires, MD  famotidine (PEPCID) 20 MG tablet Take 20 mg by mouth 2 (two) times daily.    [provider]  ferrous sulfate 325 (65 FE) MG tablet Take 325 mg by mouth daily.     [provider]  methocarbamol (ROBAXIN) 500 MG tablet Take 500 mg by mouth 2 (two) times daily as needed for muscle spasms.    [provider]  ondansetron (ZOFRAN) 4 MG tablet Take 4 mg by mouth every 8 (eight) hours as needed for nausea or vomiting.    [provider]  ondansetron (ZOFRAN-ODT) 8 MG disintegrating tablet Take 8 mg by mouth every 8 (eight) hours as needed. 03/26/19   [provider]  pravastatin (PRAVACHOL) 20 MG tablet Take 20 mg by mouth at bedtime.  09/17/14   [provider]  sertraline (ZOLOFT) 50 MG tablet Take 50 mg by mouth daily.  03/15/12   [provider]     Allergies:    Allergies  Allergen Reactions  . Amoxicillin Other (See Comments)    Very  weak Has patient had a PCN reaction causing immediate rash, facial/tongue/throat swelling, SOB or lightheadedness with hypotension: no Has patient had a PCN reaction causing severe rash involving mucus membranes or skin necrosis: no Has patient had a PCN reaction that required hospitalization: no Has patient had a PCN reaction occurring within the last 10 years: no If all of the above answers are "NO", then may proceed with Cephalosporin use.   . Diltiazem   . Diltiazem Hcl Other (See Comments)    Reaction unknown  . Losartan Potassium Other (See Comments)    Potassium level increased drastically  . Potassium-Containing Compounds Other (See Comments)    Pt is very sensitive to potassium products. Her Potassium rises  very quickly and takes a long time to come down.  . Sulfonamide Derivatives Other (See Comments)    Reaction unknown  . Zetia [Ezetimibe] Other (See Comments)    Weakness  . Penicillins Rash    DID THE REACTION INVOLVE: Swelling of the face/tongue/throat, SOB, or low BP? Unknown Sudden or severe rash/hives, skin peeling, or the inside of the mouth or nose? Unknown Did it require medical treatment? Unknown When did it last happen?unknown If all above answers are "NO", may proceed with cephalosporin use.     Social History:   Social History   Socioeconomic History  . Marital status: Widowed    Spouse name: Not on file  . Number of children: Not on file  . Years of education: Not on file  . Highest education level: Not on file  Occupational History  . Not on file  Tobacco Use  . Smoking status: Former Smoker    Years: 10.00    Types: Cigarettes    Quit date: 02/08/1973    Years since quitting: 46.4  . Smokeless tobacco: Never Used  Substance and Sexual Activity  . Alcohol use: No  . Drug use: No  . Sexual activity: Not Currently  Other Topics Concern  . Not on file  Social History Narrative  . Not on file   Social Determinants of Health   Financial  Resource Strain:   . Difficulty of Paying Living Expenses:   Food Insecurity:   . Worried About Charity fundraiser in the Last Year:   . Arboriculturist in the Last Year:   Transportation Needs:   . Film/video editor (Medical):   Marland Kitchen Lack of Transportation (Non-Medical):   Physical Activity:   . Days of Exercise per Week:   . Minutes of Exercise per Session:   Stress:   . Feeling of Stress :   Social Connections:   . Frequency of Communication with Friends and Family:   . Frequency of Social Gatherings with Friends and Family:   . Attends Religious Services:   . Active Member of Clubs or Organizations:   . Attends Archivist Meetings:   Marland Kitchen Marital Status:   Intimate Partner Violence:   . Fear of Current or Ex-Partner:   . Emotionally Abused:   Marland Kitchen Physically Abused:   . Sexually Abused:     Family History:   The patient's family history includes Arrhythmia in her mother; Asthma in her sister; Cancer in her brother and sister; Diabetes in her father and sister; Fainting in her mother; Heart attack in her brother; Heart disease in her mother; Hypertension in her father and mother; Stroke in her father; Sudden death in her brother.    ROS:  Please see the history of present illness.  All other ROS reviewed and negative.     Physical Exam/Data:   Vitals:   06/27/19 0100 06/27/19 0115 06/27/19 0130 06/27/19 0145  BP: (!) 120/58 (!) 147/69 (!) 128/56 (!) 125/50  Pulse: 78 87 81 86  Resp: _0 Temp:      TempSrc:      SpO2: 100% 96% 97% 94%   No intake or output data in the 24 hours ending 06/27/19 0319 Last 3 Weights 05/04/2019 04/25/2019 10/13/2018  Weight (lbs) 164 lb 164 lb 158 lb  Weight (kg) 74.39 kg 74.39 kg 71.668 kg     There is no height or weight on file to calculate BMI.  General:  Well  nourished, well developed, in no acute distress HEENT: normal Neck: noJVD Vascular: No carotid bruits; FA pulses 2+ bilaterally without bruits  Cardiac:   normal S1, S2; RRR; no murmur. Lungs:  +bilateral wheeze.  Abd: soft, nontender, no hepatomegaly  Ext: 1+ edema bilaterally.  Skin: warm and dry  Psych:  Normal affect   EKG:  The ECG that was done SR normal axis, normal interval. LAFB.   Relevant CV Studies: TTE 05/2017: Study Conclusions   - Left ventricle: The cavity size was normal. Wall thickness was  increased in a pattern of moderate LVH. Systolic function was  normal. The estimated ejection fraction was in the range of 55%  to 60%. Wall motion was normal; there were no regional wall  motion abnormalities. Doppler parameters are consistent with  abnormal left ventricular relaxation (grade 1 diastolic  dysfunction).  - Aortic valve: There was no stenosis.  - Mitral valve: Mildly calcified annulus. There was trivial  regurgitation.  - Left atrium: The atrium was moderately dilated.  - Right ventricle: The cavity size was normal. Pacer wire or  catheter noted in right ventricle. Systolic function was normal.  - Tricuspid valve: Peak RV-RA gradient (S): 16 mm Hg.  - Pulmonary arteries: PA peak pressure: 19 mm Hg (S).  - Inferior vena cava: The vessel was normal in size. The  respirophasic diameter changes were in the normal range (>= 50%),  consistent with normal central venous pressure.   Impressions:   - Normal LV size with moderate LV hypertrophy. EF 55-60%. Normal RV  size and systolic function. No significant valvular  abnormalities.   Laboratory Data:  High Sensitivity Troponin:   Recent Labs  Lab 06/26/19 1720 06/26/19 2105 06/26/19 2327  TROPONINIHS 19* 723* 1,054*      Chemistry Recent Labs  Lab 06/26/19 1720  NA 141  K 4.1  CL 107  CO2 22  GLUCOSE 125*  BUN 22  CREATININE 1.16*  CALCIUM 9.0  GFRNONAA 42*  GFRAA 48*  ANIONGAP 12    No results for input(s): PROT, ALBUMIN, AST, ALT, ALKPHOS, BILITOT in the last 168 hours. Hematology Recent Labs  Lab 06/26/19 1720    WBC 5.5  RBC 3.34*  HGB 10.9*  HCT 34.4*  MCV 103.0*  MCH 32.6  MCHC 31.7  RDW 13.9  PLT 119*   BNP Recent Labs  Lab 06/26/19 2335  BNP 356.9*    DDimer No results for input(s): DDIMER in the last 168 hours.   Radiology/Studies:  DG Chest 2 View  Result Date: 06/26/2019 CLINICAL DATA:  Congestion, shortness of breath, chest pain EXAM: CHEST - 2 VIEW COMPARISON:  05/03/2019 FINDINGS: Left pacer remains in place, unchanged. Bibasilar atelectasis. Heart is normal size. No effusions or edema. No acute bony abnormality. IMPRESSION: Bibasilar atelectasis. Electronically Signed   By: Rolm Baptise M.D.   On: 06/26/2019 19:22    Assessment and Plan:   Ms. Galeno is an 86 year old woman who presents with productive cough, shortness of breath, and dyspnea. Overall picture seems consistent with likely viral URI (COVID negative) particularly in light of multiple additional sick contacts. She denied having any associated CP during this episode and thus tend to favor demand ischemia from hypoxia and respiratory distress rather than true ACS as etiology of abnormal biomarkers.   Thus would recommend: -- Continue to trend troponin. Hold heparin for now (as do not suspect ACS; favor demand). -- Complete TTE in the AM.  -- Will arrange for PPM  interrogation.  -- Would favor allowing her time to get over her acute upper respiratory illness and then can consider non-invasive ischemic evaluation pending troponin trend and TTE data.    For questions or updates, please contact [facility] Please consult [email or web address] for contact info under     Signed, Milus Banister, MD  06/27/2019 3:19 AM

## 2019-06-27 NOTE — H&P (Addendum)
History and Physical    LEGACY CARRENDER NWG:[id number] DOB: 1927/12/19 DOA: 06/26/2019  PCP: [name], MD  Patient coming from: Home.  Chief Complaint: Shortness of breath.  HPI: [name] is a 86 y.o. female with history of paroxysmal atrial fibrillation, sinus node dysfunction status post pacemaker placement, diastolic CHF chronic kidney disease stage III, hypertension anemia asthma presents to the ER because of shortness of breath.  Patient states she has been having shortness of breath and upper respiratory tract symptoms for the last 3 to 4 weeks which has been progressively worsening.  Over the last 24 hours patient symptoms have worsened with increasing productive cough.  Denies fever chills or chest pain.  Patient states her family members also has been sick.  ED Course: In the ER EKG shows normal sinus rhythm nonspecific changes per chest x-ray shows atelectasis.  Covid antigen test was negative but Covid PCR is pending.  Labs show microcytic anemia with WBC of 5.5 high sensitive troponin increased from 19-7 23 and the next one was 1054.  Creatinine 1.1.  Patient admitted for non-ST elevation MI cardiology notified and since patient was mildly wheezing and nebulizer prednisone was started.  Review of Systems: As per HPI, rest all negative.   Past Medical History:  Diagnosis Date  . Anemia    "I stay anemic"  . Arthritis   . Asthma    no problems recent  . Cataract   . Chronic diastolic CHF (congestive heart failure), NYHA class 2 ([place]) 03/19/2014   Echo 4/19: mod LVH, EF 55-60, no RWMA, Gr 1 DD, MAC, trivial MR, mod LAE, normal RVSF, PASP 19  . CKD (chronic kidney disease), stage III   . Complication of anesthesia    CONFUSED AFTER KNEE SURGERY  . Depression   . Diverticulitis   . DVT (deep venous thrombosis) ([place])    from birth control, left groin  . Essential hypertension   . Fibromyalgia 10 y.a.  . GERD (gastroesophageal reflux disease)   . Gout     . Hx of cardiovascular stress test    Lexiscan Myoview (8/15): apical attenuation artifact, no ischemia, EF 69% - low risk.  Marland Kitchen Hypercholesteremia   . Odynophagia   . Other abnormal glucose   . PAF (paroxysmal atrial fibrillation) ([place])   . Personal history of other diseases of digestive system   . Pneumonia    hx of  . Presence of permanent cardiac pacemaker   . Sinus bradycardia   . Skin cancer 2014   skin R elbow & face  . Symptomatic bradycardia 06/2016    Past Surgical History:  Procedure Laterality Date  . ABDOMINAL HYSTERECTOMY     partial  . APPENDECTOMY  ~55years ago  . CHOLECYSTECTOMY  10/2005   Dr. Effie Shy  . COLONOSCOPY W/ BIOPSIES  2005, 2011   Dr. Bing Plume, "balloon inside for last one at [facility]"  . INTRAMEDULLARY (IM) NAIL INTERTROCHANTERIC Left 02/10/2018   Procedure: INTRAMEDULLARY (IM) NAIL INTERTROCHANTRIC;  Surgeon: Rod Can, MD;  Location: [facility];  Service: Orthopedics;  Laterality: Left;  . IR THORACENTESIS ASP PLEURAL SPACE W/IMG GUIDE  06/13/2017  . KNEE ARTHROSCOPY Right   . LAMINECTOMY  05/2007   foraminotomy, L4-5 laminectomy  . LARYNGOSCOPY / BRONCHOSCOPY / ESOPHAGOSCOPY  2010   Dr. Benjamine Mola  . PACEMAKER IMPLANT N/A 07/01/2016   Procedure: Pacemaker Implant;  Surgeon: Evans Lance, MD;  Location: [facility];  Service: Cardiovascular;  Laterality: N/A;  .  SKIN CANCER EXCISION Right ~2005   r elbow  . TOTAL KNEE ARTHROPLASTY Left 04/04/2013   Procedure: LEFT TOTAL KNEE ARTHROPLASTY;  Surgeon: Tobi Bastos, MD;  Location: [facility];  Service: Orthopedics;  Laterality: Left;  . TUBAL LIGATION    . TUMOR REMOVAL Right ~ 20 years ago   tumor in between toes     reports that she quit smoking about 46 years ago. Her smoking use included cigarettes. She quit after 10.00 years of use. She has never used smokeless tobacco. She reports that she does not drink alcohol or use drugs.  Allergies  Allergen Reactions  . Amoxicillin Other  (See Comments)    Very weak Has patient had a PCN reaction causing immediate rash, facial/tongue/throat swelling, SOB or lightheadedness with hypotension: no Has patient had a PCN reaction causing severe rash involving mucus membranes or skin necrosis: no Has patient had a PCN reaction that required hospitalization: no Has patient had a PCN reaction occurring within the last 10 years: no If all of the above answers are "NO", then may proceed with Cephalosporin use.   . Diltiazem   . Diltiazem Hcl Other (See Comments)    Reaction unknown  . Losartan Potassium Other (See Comments)    Potassium level increased drastically  . Potassium-Containing Compounds Other (See Comments)    Pt is very sensitive to potassium products. Her Potassium rises very quickly and takes a long time to come down.  . Sulfonamide Derivatives Other (See Comments)    Reaction unknown  . Zetia [Ezetimibe] Other (See Comments)    Weakness  . Penicillins Rash    DID THE REACTION INVOLVE: Swelling of the face/tongue/throat, SOB, or low BP? Unknown Sudden or severe rash/hives, skin peeling, or the inside of the mouth or nose? Unknown Did it require medical treatment? Unknown When did it last happen?unknown If all above answers are "NO", may proceed with cephalosporin use.     Family History  Problem Relation Age of Onset  . Heart disease Mother   . Hypertension Mother   . Fainting Mother   . Arrhythmia Mother   . Diabetes Father   . Stroke Father   . Hypertension Father   . Cancer Brother   . Sudden death Brother   . Heart attack Brother   . Cancer Sister   . Diabetes Sister   . Asthma Sister     Prior to Admission medications   Medication Sig Start Date End Date Taking? Authorizing Provider  acetaminophen (TYLENOL) 500 MG tablet Take 2 tablets (1,000 mg total) by mouth every 8 (eight) hours as needed for mild pain or headache. Patient taking differently: Take 500 mg by mouth every 8 (eight) hours  as needed for mild pain or headache.  05/16/17   Hongalgi, Lenis Dickinson, MD  albuterol (PROVENTIL HFA;VENTOLIN HFA) 108 (90 BASE) MCG/ACT inhaler Inhale 1 puff into the lungs every 6 (six) hours as needed for wheezing or shortness of breath. 11/13/14   Dorothy Spark, MD  alendronate (FOSAMAX) 70 MG tablet Take 70 mg by mouth once a week. On Friday 02/13/14   [provider]  allopurinol (ZYLOPRIM) 100 MG tablet Take 1 tablet (100 mg total) by mouth daily. 04/01/16   Haney, Yetta Flock A, MD  amLODipine (NORVASC) 2.5 MG tablet Take 1 tablet (2.5 mg total) by mouth daily. Please make overdue appt with Dr. Meda Coffee before anymore refills. 1st attempt 08/09/18   Dorothy Spark, MD  aspirin EC 81 MG tablet  Take 81 mg by mouth daily.     [provider]  budesonide-formoterol (SYMBICORT) 80-4.5 MCG/ACT inhaler Inhale 2 puffs into the lungs 2 (two) times daily. 08/02/18   Chesley Mires, MD  famotidine (PEPCID) 20 MG tablet Take 20 mg by mouth 2 (two) times daily.    [provider]  ferrous sulfate 325 (65 FE) MG tablet Take 325 mg by mouth daily.     [provider]  methocarbamol (ROBAXIN) 500 MG tablet Take 500 mg by mouth 2 (two) times daily as needed for muscle spasms.    [provider]  ondansetron (ZOFRAN) 4 MG tablet Take 4 mg by mouth every 8 (eight) hours as needed for nausea or vomiting.    [provider]  ondansetron (ZOFRAN-ODT) 8 MG disintegrating tablet Take 8 mg by mouth every 8 (eight) hours as needed. 03/26/19   [provider]  pravastatin (PRAVACHOL) 20 MG tablet Take 20 mg by mouth at bedtime.  09/17/14   [provider]  sertraline (ZOLOFT) 50 MG tablet Take 50 mg by mouth daily.  03/15/12   [provider]    Physical Exam: Constitutional: Moderately built and nourished. Vitals:   06/27/19 0245 06/27/19 0300 06/27/19 0315 06/27/19 0345  BP: (!) 113/59 (!) 116/97 (!) 114/51   Pulse: 72 72 69 67  Resp: _0 Temp:      TempSrc:      SpO2: 96% 93% 95% 93%   Eyes: Anicteric no pallor. ENMT: No discharge from the ears eyes nose or mouth. Neck: No mass felt.  No neck rigidity. Respiratory: Mild expiratory wheeze and no crepitations. Cardiovascular: S1-S2 heard. Abdomen: Soft nontender bowel sounds present. Musculoskeletal: No edema. Skin: No rash. Neurologic: Alert awake oriented to time place and person.  Moves all extremities. Psychiatric: Appears normal per normal affect.   Labs on Admission: I have personally reviewed following labs and imaging studies  CBC: Recent Labs  Lab 06/26/19 1720  WBC 5.5  HGB 10.9*  HCT 34.4*  MCV 103.0*  PLT 989*   Basic Metabolic Panel: Recent Labs  Lab 06/26/19 1720  NA 141  K 4.1  CL 107  CO2 22  GLUCOSE 125*  BUN 22  CREATININE 1.16*  CALCIUM 9.0   GFR: CrCl cannot be calculated (Unknown ideal weight.). Liver Function Tests: No results for input(s): AST, ALT, ALKPHOS, BILITOT, PROT, ALBUMIN in the last 168 hours. No results for input(s): LIPASE, AMYLASE in the last 168 hours. No results for input(s): AMMONIA in the last 168 hours. Coagulation Profile: No results for input(s): INR, PROTIME in the last 168 hours. Cardiac Enzymes: No results for input(s): CKTOTAL, CKMB, CKMBINDEX, TROPONINI in the last 168 hours. BNP (last 3 results) No results for input(s): PROBNP in the last 8760 hours. HbA1C: No results for input(s): HGBA1C in the last 72 hours. CBG: No results for input(s): GLUCAP in the last 168 hours. Lipid Profile: No results for input(s): CHOL, HDL, LDLCALC, TRIG, CHOLHDL, LDLDIRECT in the last 72 hours. Thyroid Function Tests: No results for input(s): TSH, T4TOTAL, FREET4, T3FREE, THYROIDAB in the last 72 hours. Anemia Panel: No results for input(s): VITAMINB12, FOLATE, FERRITIN, TIBC, IRON, RETICCTPCT in the last 72 hours. Urine analysis:    Component Value Date/Time   COLORURINE YELLOW 08/17/2018 1520    APPEARANCEUR CLEAR 08/17/2018 1520   LABSPEC 1.008 08/17/2018 1520   PHURINE 7.0 08/17/2018 1520   GLUCOSEU NEGATIVE 08/17/2018 1520   [place] 08/17/2018  [place] 08/17/2018 [place] 08/17/2018 [place] 08/17/2018 1520   UROBILINOGEN 0.2 07/20/2013 1457   NITRITE NEGATIVE 08/17/2018 1520   LEUKOCYTESUR NEGATIVE 08/17/2018 1520   Sepsis Labs: _0 (procalcitonin:4,lacticidven:4) )No results found for this or any previous visit (from the past 240 hour(s)).   Radiological Exams on Admission: DG Chest 2 View  Result Date: 06/26/2019 CLINICAL DATA:  Congestion, shortness of breath, chest pain EXAM: CHEST - 2 VIEW COMPARISON:  05/03/2019 FINDINGS: Left pacer remains in place, unchanged. Bibasilar atelectasis. Heart is normal size. No effusions or edema. No acute bony abnormality. IMPRESSION: Bibasilar atelectasis. Electronically Signed   By: Rolm Baptise M.D.   On: 06/26/2019 19:22    EKG: Independently reviewed.  Normal sinus rhythm.  Assessment/Plan Principal Problem:   Non-ST elevated myocardial infarction [facility]) Active Problems:   PAROXYSMAL ATRIAL FIBRILLATION   Pulmonary hypertension ([facility])   Asthma   Chronic diastolic CHF (congestive heart failure), NYHA class 2 ([facility])   CKD (chronic kidney disease), stage III   Essential hypertension   Acute upper respiratory infection   Pacemaker    1. Non-ST elevation MI -discussed with cardiologist.  Likely precipitated by patient's respiratory symptoms.  Will cycle cardiac markers check 2D echo patient on aspirin and statin.  Per cardiology no indication to start heparin at this time. 2. Upper respiratory tract symptoms with history of asthma for which I have placed patient on doxycycline since patient has productive cough check sputum cultures continue nebulizer Pulmicort.  Patient did receive 1 dose of prednisone in the ER. 3. Diastolic CHF appears compensated check 2D  echo. 4. Hypertension on amlodipine. 5. Depression on Zoloft. 6. Chronic kidney disease stage III creatinine appears to be at baseline. 7. Macrocytic anemia appears to be chronic follow CBC.  Since patient has acute non-ST relation MI with ongoing shortness of breath with respiratory tract infection will need close monitoring for any further worsening in inpatient status.  Covid test pending.   DVT prophylaxis: Lovenox. Code Status: DNR. Family Communication: Discussed with patient. Disposition Plan: Home. Consults called: Cardiology. Admission status: Inpatient.   Rise Patience MD [facility] Pager [phone].  If 7PM-7AM, please contact night-coverage [email or web address] Password [facility]  06/27/2019, 4:13 AM

## 2019-06-27 NOTE — ED Notes (Signed)
Lunch Tray Ordered @ 1040. 

## 2019-06-27 NOTE — ED Notes (Signed)
Grand daughter Manuela Schwartz can be reached at [phone] with any updates.

## 2019-06-28 ENCOUNTER — Inpatient Hospital Stay (HOSPITAL_COMMUNITY): Payer: Medicare HMO

## 2019-06-28 DIAGNOSIS — I361 Nonrheumatic tricuspid (valve) insufficiency: Secondary | ICD-10-CM

## 2019-06-28 DIAGNOSIS — I214 Non-ST elevation (NSTEMI) myocardial infarction: Secondary | ICD-10-CM

## 2019-06-28 LAB — COMPREHENSIVE METABOLIC PANEL
ALT: 18 U/L (ref 0–44)
AST: 20 U/L (ref 15–41)
Albumin: 3.6 g/dL (ref 3.5–5.0)
Alkaline Phosphatase: 76 U/L (ref 38–126)
Anion gap: 12 (ref 5–15)
BUN: 36 mg/dL — ABNORMAL HIGH (ref 8–23)
CO2: 23 mmol/L (ref 22–32)
Calcium: 9 mg/dL (ref 8.9–10.3)
Chloride: 105 mmol/L (ref 98–111)
Creatinine, Ser: 1.45 mg/dL — ABNORMAL HIGH (ref 0.44–1.00)
GFR calc Af Amer: 37 mL/min — ABNORMAL LOW (ref >60)
GFR calc non Af Amer: 32 mL/min — ABNORMAL LOW (ref >60)
Glucose, Bld: 107 mg/dL — ABNORMAL HIGH (ref 70–99)
Potassium: 4.1 mmol/L (ref 3.5–5.1)
Sodium: 140 mmol/L (ref 135–145)
Total Bilirubin: 0.6 mg/dL (ref 0.3–1.2)
Total Protein: 5.6 g/dL — ABNORMAL LOW (ref 6.5–8.1)

## 2019-06-28 LAB — RETICULOCYTES
Immature Retic Fract: 15.9 % (ref 2.3–15.9)
RBC.: 2.87 MIL/uL — ABNORMAL LOW (ref 3.87–5.11)
Retic Count, Absolute: 43.1 10*3/uL (ref 19.0–186.0)
Retic Ct Pct: 1.5 % (ref 0.4–3.1)

## 2019-06-28 LAB — CBC
HCT: 29.4 % — ABNORMAL LOW (ref 36.0–46.0)
Hemoglobin: 9.6 g/dL — ABNORMAL LOW (ref 12.0–15.0)
MCH: 32.5 pg (ref 26.0–34.0)
MCHC: 32.7 g/dL (ref 30.0–36.0)
MCV: 99.7 fL (ref 80.0–100.0)
Platelets: 125 10*3/uL — ABNORMAL LOW (ref 150–400)
RBC: 2.95 MIL/uL — ABNORMAL LOW (ref 3.87–5.11)
RDW: 14 % (ref 11.5–15.5)
WBC: 6.9 10*3/uL (ref 4.0–10.5)
nRBC: 0 % (ref 0.0–0.2)

## 2019-06-28 LAB — ECHOCARDIOGRAM COMPLETE: Weight: 2596.8 oz

## 2019-06-28 LAB — MAGNESIUM: Magnesium: 1.8 mg/dL (ref 1.7–2.4)

## 2019-06-28 LAB — IRON AND TIBC
Iron: 28 ug/dL (ref 28–170)
Saturation Ratios: 11 % (ref 10.4–31.8)
TIBC: 260 ug/dL (ref 250–450)
UIBC: 232 ug/dL

## 2019-06-28 LAB — VITAMIN B12: Vitamin B-12: 156 pg/mL — ABNORMAL LOW (ref 180–914)

## 2019-06-28 LAB — FERRITIN: Ferritin: 203 ng/mL (ref 11–307)

## 2019-06-28 LAB — FOLATE: Folate: 6.5 ng/mL (ref >5.9)

## 2019-06-28 MED ORDER — ROSUVASTATIN CALCIUM 5 MG PO TABS
10.0000 mg | ORAL_TABLET | Freq: Every day | ORAL | Status: DC
  Administered 2019-06-28: 10 mg via ORAL
  Filled 2019-06-28: qty 2

## 2019-06-28 MED ORDER — HYDROCOD POLST-CPM POLST ER 10-8 MG/5ML PO SUER: 5.0000 mL | Freq: Two times a day (BID) | ORAL | 0 refills | Status: DC | PRN

## 2019-06-28 MED ORDER — HYDROCOD POLST-CPM POLST ER 10-8 MG/5ML PO SUER: 5.0000 mL | Freq: Two times a day (BID) | ORAL | Status: DC | PRN

## 2019-06-28 MED ORDER — ENOXAPARIN SODIUM 30 MG/0.3ML ~~LOC~~ SOLN: 30.0000 mg | SUBCUTANEOUS | Status: DC

## 2019-06-28 MED ORDER — ROSUVASTATIN CALCIUM 10 MG PO TABS: 10.0000 mg | ORAL_TABLET | Freq: Every day | ORAL | 11 refills | Status: DC

## 2019-06-28 NOTE — Progress Notes (Addendum)
Progress Note  Patient Name: [name] Date of Encounter: 06/28/2019  Primary Cardiologist: Ena Dawley, MD   Subjective   Pt coughed all night despite cough drops and cough syrup. States her family has been sick with URI, including grandson. May consider influenza testing, defer to primary.   Chest pain with cough, worse with inspiration.  Inpatient Medications    Scheduled Meds: . aspirin EC  81 mg Oral Daily  . cholecalciferol  1,000 Units Oral Daily  . enoxaparin (LOVENOX) injection  40 mg Subcutaneous Q24H  . melatonin  3 mg Oral QHS   Continuous Infusions:  PRN Meds: albuterol, benzonatate, guaiFENesin-dextromethorphan, hydrALAZINE, menthol-cetylpyridinium, ondansetron **OR** ondansetron (ZOFRAN) IV   Vital Signs    Vitals:   06/27/19 2037 06/27/19 2350 06/28/19 0501 06/28/19 0547  BP: (!) 117/53 119/60  (!) 102/47  Pulse: 61 82  (!) 59  Resp: 15 20  16   Temp: 99 F (37.2 C) 99.3 F (37.4 C)  98.4 F (36.9 C)  TempSrc:  Oral  Oral  SpO2: 96% 95%  95%  Weight:   73.6 kg     Intake/Output Summary (Last 24 hours) at 06/28/2019 0716 Last data filed at 06/27/2019 2350 Gross per 24 hour  Intake 462 ml  Output 1175 ml  Net -713 ml   Last 3 Weights 06/28/2019 05/04/2019 04/25/2019  Weight (lbs) 162 lb 4.8 oz 164 lb 164 lb  Weight (kg) 73.619 kg 74.39 kg 74.39 kg      Telemetry    Sinus with some episodes of A-pacing - Personally Reviewed  ECG    No new tracings - Personally Reviewed  Physical Exam   GEN: No acute distress.   Neck: No JVD Cardiac: RRR, no murmurs, rubs, or gallops.  Respiratory: frequent cough, no crackles. GI: Soft, nontender, non-distended  MS: No edema; No deformity. Neuro:  Nonfocal  Psych: Normal affect   Labs    High Sensitivity Troponin:   Recent Labs  Lab 06/26/19 1720 06/26/19 2105 06/26/19 2327 06/27/19 0410 06/27/19 0611  TROPONINIHS 19* 723* 1,054* 951* 872*      Chemistry Recent Labs  Lab  06/26/19 1720 06/27/19 0410 06/28/19 0338  NA 141 138 140  K 4.1 4.8 4.1  CL 107 105 105  CO2 22 22 23   GLUCOSE 125* 186* 107*  BUN 22 23 36*  CREATININE 1.16* 1.31* 1.45*  CALCIUM 9.0 8.8* 9.0  PROT  --  5.7* 5.6*  ALBUMIN  --  3.6 3.6  AST  --  36 20  ALT  --  22 18  ALKPHOS  --  83 76  BILITOT  --  1.1 0.6  GFRNONAA 42* 36* 32*  GFRAA 48* 42* 37*  ANIONGAP 12 11 12      Hematology Recent Labs  Lab 06/26/19 1720 06/26/19 1720 06/27/19 0410 06/28/19 0338  WBC 5.5  --  5.3 6.9  RBC 3.34*   < > 2.93* 2.95*  2.87*  HGB 10.9*  --  9.6* 9.6*  HCT 34.4*  --  30.0* 29.4*  MCV 103.0*  --  102.4* 99.7  MCH 32.6  --  32.8 32.5  MCHC 31.7  --  32.0 32.7  RDW 13.9  --  13.8 14.0  PLT 119*  --  124* 125*   < > = values in this interval not displayed.    BNP Recent Labs  Lab 06/26/19 2335  BNP 356.9*     DDimer No results for input(s): DDIMER in the  last 168 hours.   Radiology    DG Chest 2 View  Result Date: 06/26/2019 CLINICAL DATA:  Congestion, shortness of breath, chest pain EXAM: CHEST - 2 VIEW COMPARISON:  05/03/2019 FINDINGS: Left pacer remains in place, unchanged. Bibasilar atelectasis. Heart is normal size. No effusions or edema. No acute bony abnormality. IMPRESSION: Bibasilar atelectasis. Electronically Signed   By: Rolm Baptise M.D.   On: 06/26/2019 19:22    Cardiac Studies   Echo 05/26/17: Study Conclusions  - Left ventricle: The cavity size was normal. Wall thickness was  increased in a pattern of moderate LVH. Systolic function was  normal. The estimated ejection fraction was in the range of 55%  to 60%. Wall motion was normal; there were no regional wall  motion abnormalities. Doppler parameters are consistent with  abnormal left ventricular relaxation (grade 1 diastolic  dysfunction).  - Aortic valve: There was no stenosis.  - Mitral valve: Mildly calcified annulus. There was trivial  regurgitation.  - Left atrium: The atrium  was moderately dilated.  - Right ventricle: The cavity size was normal. Pacer wire or  catheter noted in right ventricle. Systolic function was normal.  - Tricuspid valve: Peak RV-RA gradient (S): 16 mm Hg.  - Pulmonary arteries: PA peak pressure: 19 mm Hg (S).  - Inferior vena cava: The vessel was normal in size. The  respirophasic diameter changes were in the normal range (>= 50%),  consistent with normal central venous pressure.   Impressions:  - Normal LV size with moderate LV hypertrophy. EF 55-60%. Normal RV  size and systolic function. No significant valvular  abnormalities.   Patient Profile     86 y.o. female with history of chronic CHF (diastolic), sinus node dysfunction w/PPM, Afibnot on AC, PVCs HTN, HLD, CKD (III), p.HTN, h/o pleural effusion requiring thora May 2019, who presented with SOB, and was found to have elevated troponins  Assessment & Plan    Elevated troponin - hs troponin 19 --> 723 --> 1054 --> 951 --> 872 - EKG  Nonischemic - nonischemic myoview 2015 - continue 81 mg ASA   Chronic diastolic heart failure Mild pulmonary hypertension - BNP 357 - received lasix 40 mg x 1 with 1.2 L urine output - do not feel she is significantly volume overalaoded   Shortness of breath - CXR negative for PNA - COVID negative - son and grandson had URI and fever - ?rule out influenza   CKD stage III - sCr 1.16 --> 1.45   PAF  SSS s/p PPM - not on anticoagulation   Hypertension - home amlodipine on hold - pressures marginal   Pt has atypical chest pain related to her cough, but with troponin elevation. Will plan for OP follow up and stress testing once she has recovered from this illness. OK to hold amlodipine if pressures remain marginal with instructions for BP log prior to appt.   I will arrange cardiology follow up.    [facility] will sign off - after Dr. Marlou Porch sees Medication Recommendations:  As above Other recommendations  (labs, testing, etc):  OP NM Follow up as an outpatient:  2-3 weeks  For questions or updates, please contact [facility] Please consult [email or web address] for contact info under        Signed, Ledora Bottcher, PA  06/28/2019, 7:16 AM    Personally seen and examined. Agree with above.   Still with cough,  NO CP  GEN: Well nourished, well developed, in no acute distress  Elderly HEENT: normal  Neck: no JVD, carotid bruits, or masses Cardiac: RRR; no murmurs, rubs, or gallops,no edema  Respiratory:  Mild wheeze GI: soft, nontender, nondistended, + BS MS: no deformity or atrophy  Skin: warm and dry, no rash Neuro:  Alert and Oriented x 3, Strength and sensation are intact Psych: euthymic mood, full affect  Trop trending down 1000---872  A/P  Elevated troponin, demand ischemia in the setting of URI  - given advanced age, could certainly have underlying severe coronary artery disease.  Would continue to treat underlying upper respiratory infection.  She is not having any chest discomfort.  I would like for her to get over this infection and then see her as an outpatient to consider further restratification with pharmacologic stress test to see if she does have any high risk ischemia.  Nonetheless, continue with medical management.  Her elevated troponin does portend a worsened morbidity mortality.  We will see in follow-up.  Candee Furbish, MD

## 2019-06-28 NOTE — Evaluation (Signed)
Physical Therapy Evaluation Patient Details Name: [name] MRN: [id number] DOB: 1927/12/31 Today's Date: 06/28/2019   History of Present Illness  86 yo admitted with cough, SOB and demand ischemia related to URI. PMHx: CHF, PPM, AFib, HTN, HLD, CKD, Lt hip fx  Clinical Impression  Pt presents with an overall decrease in functional mobility, decrease in balance and generalized weakness secondary to above. PTA, pt lives with daughter in one story home. Educ on proper use of RW and importance of use of RW at home. Today, pt able to complete bed mobility independently, transfer and ambulate with supervision assistance. Pt completed stair negotiation up 2 steps with min guard for safety. Discussed importance of supervision at home, assistance with stair mobility, which the pt indicated her children would be able to assist with. Discussed d/c recommendations for home with home health physical therapy, which the pt was agreeable. Pt would benefit from continued acute PT services to maximize functional mobility and independence prior to d/c to next level of care.     Follow Up Recommendations Home health PT    Equipment Recommendations  None recommended by PT    Recommendations for Other Services       Precautions / Restrictions Precautions Precautions: Fall      Mobility  Bed Mobility Overal bed mobility: Independent                Transfers Overall transfer level: Needs assistance Equipment used: Rolling walker (2 wheeled) Transfers: Sit to/from Stand Sit to Stand: Supervision         General transfer comment: sit to stand supervision for safety  Ambulation/Gait Ambulation/Gait assistance: Supervision Gait Distance (Feet): 310 Feet Assistive device: Rolling walker (2 wheeled) Gait Pattern/deviations: Step-through pattern;Trunk flexed;Decreased stride length;Wide base of support Gait velocity: decreased   General Gait Details: Slowed gait,supervision for safety,  required consistent verbal cues for proper use of RW with amb.  Stairs Stairs: Yes Stairs assistance: Min guard Stair Management: Two rails;Backwards;Forwards Number of Stairs: 2 General stair comments: Required rail on right for ascent, bilateral UE on rail descent. Pt reports she will have family to assist her in and out of the house  Wheelchair Mobility    Modified Rankin (Stroke Patients Only)       Balance Overall balance assessment: History of Falls;Needs assistance   Sitting balance-Leahy Scale: Good     Standing balance support: Bilateral upper extremity supported;No upper extremity supported Standing balance-Leahy Scale: Fair Standing balance comment: able to stand at sink without UE support, RW for gait                             Pertinent Vitals/Pain Pain Assessment: 0-10 Pain Score: 3  Pain Location: chest    Home Living Family/patient expects to be discharged to:: Private residence Living Arrangements: Children   Type of Home: House Home Access: Stairs to enter   Technical brewer of Steps: 2 Home Layout: One level Home Equipment: Shower seat - built in;Walker - 2 wheels;Cane - single point Additional Comments: 2-3 falls    Prior Function                 Hand Dominance        Extremity/Trunk Assessment   Upper Extremity Assessment Upper Extremity Assessment: Generalized weakness    Lower Extremity Assessment Lower Extremity Assessment: Generalized weakness    Cervical / Trunk Assessment Cervical / Trunk Assessment: Kyphotic  Communication  Cognition Arousal/Alertness: Awake/alert Behavior During Therapy: WFL for tasks assessed/performed Overall Cognitive Status: Within Functional Limits for tasks assessed                                        General Comments      Exercises     Assessment/Plan    PT Assessment Patient needs continued PT services  PT Problem List Decreased  strength;Decreased mobility;Decreased safety awareness;Decreased activity tolerance;Decreased balance;Decreased knowledge of use of DME       PT Treatment Interventions Gait training;DME instruction;Therapeutic exercise;Functional mobility training;Therapeutic activities;Patient/family education    PT Goals (Current goals can be found in the Care Plan section)  Acute Rehab PT Goals Patient Stated Goal: return home, get back to working PT Goal Formulation: With patient Time For Goal Achievement: 07/12/19 Potential to Achieve Goals: Good    Frequency Min 3X/week   Barriers to discharge        Co-evaluation               AM-PAC PT "6 Clicks" Mobility  Outcome Measure Help needed turning from your back to your side while in a flat bed without using bedrails?: None Help needed moving from lying on your back to sitting on the side of a flat bed without using bedrails?: None Help needed moving to and from a bed to a chair (including a wheelchair)?: A Little Help needed standing up from a chair using your arms (e.g., wheelchair or bedside chair)?: A Little Help needed to walk in hospital room?: A Little Help needed climbing 3-5 steps with a railing? : A Little 6 Click Score: 20    End of Session   Activity Tolerance: Patient tolerated treatment well Patient left: in chair;with call bell/phone within reach;with chair alarm set Nurse Communication: Mobility status PT Visit Diagnosis: Other abnormalities of gait and mobility (R26.89);Difficulty in walking, not elsewhere classified (R26.2);History of falling (Z91.81)    Time: 7062-3762 PT Time Calculation (min) (ACUTE ONLY): 38 min   Charges:   PT Evaluation $PT Eval Moderate Complexity: 1 Mod PT Treatments $Gait Training: 8-22 mins $Therapeutic Activity: 8-22 mins        [employer] SPT 06/28/2019   Rolland Porter 06/28/2019, 11:37 AM

## 2019-06-28 NOTE — Progress Notes (Signed)
  Echocardiogram 2D Echocardiogram has been performed.  Jennette Dubin 06/28/2019, 10:57 AM

## 2019-06-28 NOTE — Plan of Care (Signed)
?  Problem: Coping: ?Goal: Level of anxiety will decrease ?Outcome: Progressing ?  ?Problem: Safety: ?Goal: Ability to remain free from injury will improve ?Outcome: Progressing ?  ?

## 2019-06-28 NOTE — Discharge Summary (Signed)
Physician Discharge Summary  [name] WUX:[id number] DOB: [birth year] DOA: 06/26/2019  PCP: [name], MD  Admit date: 06/26/2019 Discharge date: 06/28/2019  Admitted From: Home Disposition: Home   Recommendations for Outpatient Follow-up:  1. Follow up with PCP in 1-2 weeks 2. Please obtain BMP/CBC in one week 3. Please follow up on the following pending results: Final echocardiogram interpretation  Home Health: PT, OT, aide Equipment/Devices: None new Discharge Condition: Stable CODE STATUS: Full Diet recommendation: Heart healthy  Brief/Interim Summary: 86 year old with a history of paroxysmal atrial fibrillation, sinus node dysfunction status post pacemaker, diastolic CHF, CKD stage III, HTN, anemia, and asthma who presented to the ED with shortness of breath for 3-4 weeks which have been progressively worsening.  In the ED she was found to have an elevated troponin. Cardiology was consulted, felt this to be due to myocardial demand mismatch due to URI since CXR was clear. Cardiology recommended discharge on 5/20 with patient reporting improving symptoms.   Discharge Diagnoses:  Principal Problem:   Non-ST elevated myocardial infarction [facility]) Active Problems:   PAROXYSMAL ATRIAL FIBRILLATION   Pulmonary hypertension (HCC)   Asthma   Chronic diastolic CHF (congestive heart failure), NYHA class 2 (HCC)   CKD (chronic kidney disease), stage III   Essential hypertension   Acute upper respiratory infection   Pacemaker  Possible URI CXR without evidence of pneumonia/clear infiltrate - no elevated WBC - afebrile.  - Symptomatic management recommended.   NSTEMI  Most likely demand ischemia. consider outpatient work up per cardiology as she has radiologic evidence of CAD.   Hypomagnesemia  Chronic diastolic CHF: Echocardiogram is reassuring with normal LVEF per cardiology.   HTN  Depression  CKD stage III  Chronic macrocytic anemia  Discharge  Instructions Discharge Instructions    Diet - low sodium heart healthy   Complete by: As directed    Discharge instructions   Complete by: As directed    You were admitted for elevation of troponin level suggestive of heart damage which has improved. This was due to stress from the upper respiratory tract infection. You have no evidence of pneumonia and the symptoms related to the URI are expected to resolve with time. You may use mucinex at home to help with cough and may also take tussionex (sent to your pharmacy) if that is ineffective. Do not take this more than twice a day and use it cautiously as it may cause drowsiness. Physical therapy at home will be arranged prior to your discharge.   A follow up appointment with cardiology has been scheduled. They recommended no further work up in the hospital. If your symptoms worsen, seek medical attention.   Increase activity slowly   Complete by: As directed      Allergies as of 06/28/2019      Reactions   Amoxicillin Other (See Comments)   Very weak Has patient had a PCN reaction causing immediate rash, facial/tongue/throat swelling, SOB or lightheadedness with hypotension: no Has patient had a PCN reaction causing severe rash involving mucus membranes or skin necrosis: no Has patient had a PCN reaction that required hospitalization: no Has patient had a PCN reaction occurring within the last 10 years: no If all of the above answers are "NO", then may proceed with Cephalosporin use.   Diltiazem    Diltiazem Hcl Other (See Comments)   Reaction unknown   Losartan Potassium Other (See Comments)   Potassium level increased drastically   Potassium-containing Compounds Other (See Comments)  Pt is very sensitive to potassium products. Her Potassium rises very quickly and takes a long time to come down.   Sulfonamide Derivatives Other (See Comments)   Reaction unknown   Zetia [ezetimibe] Other (See Comments)   Weakness   Penicillins Rash    DID THE REACTION INVOLVE: Swelling of the face/tongue/throat, SOB, or low BP? Unknown Sudden or severe rash/hives, skin peeling, or the inside of the mouth or nose? Unknown Did it require medical treatment? Unknown When did it last happen?unknown If all above answers are "NO", may proceed with cephalosporin use.      Medication List    TAKE these medications   acetaminophen 500 MG tablet Commonly known as: TYLENOL Take 2 tablets (1,000 mg total) by mouth every 8 (eight) hours as needed for mild pain or headache. What changed: how much to take   albuterol 108 (90 Base) MCG/ACT inhaler Commonly known as: VENTOLIN HFA Inhale 1 puff into the lungs every 6 (six) hours as needed for wheezing or shortness of breath.   alendronate 70 MG tablet Commonly known as: FOSAMAX Take 70 mg by mouth once a week. On Friday   allopurinol 100 MG tablet Commonly known as: ZYLOPRIM Take 1 tablet (100 mg total) by mouth daily.   amLODipine 2.5 MG tablet Commonly known as: NORVASC Take 1 tablet (2.5 mg total) by mouth daily. Please make overdue appt with Dr. Meda Coffee before anymore refills. 1st attempt   aspirin EC 81 MG tablet Take 81 mg by mouth daily.   chlorpheniramine-HYDROcodone 10-8 MG/5ML Suer Commonly known as: TUSSIONEX Take 5 mLs by mouth every 12 (twelve) hours as needed (severe refractory cough).   cholecalciferol 25 MCG (1000 UNIT) tablet Commonly known as: VITAMIN D Take 1,000 Units by mouth daily.   famotidine 20 MG tablet Commonly known as: PEPCID Take 20 mg by mouth 2 (two) times daily.   ferrous sulfate 325 (65 FE) MG tablet Take 325 mg by mouth daily.   methocarbamol 500 MG tablet Commonly known as: ROBAXIN Take 500 mg by mouth 2 (two) times daily as needed for muscle spasms.   ondansetron 8 MG disintegrating tablet Commonly known as: ZOFRAN-ODT Take 8 mg by mouth every 8 (eight) hours as needed for nausea or vomiting.   rosuvastatin 10 MG tablet Commonly  known as: CRESTOR Take 1 tablet (10 mg total) by mouth daily.   sertraline 50 MG tablet Commonly known as: ZOLOFT Take 50 mg by mouth daily.      Follow-up Information    Imogene Burn, PA-C Follow up on 07/25/2019.   Specialty: Cardiology Why: Please arrive 15 minutes early for your 11:15am post-hosptial follow-up appointment Contact information: [address] [place] [address] [phone]        [name], MD. Schedule an appointment as soon as possible for a visit in 1 week(s).   Specialty: Internal Medicine Contact information: [address] [place] [address] [phone]        Golda Acre, [facility] Follow up.   Specialty: Home Health Services Why: Physical Therapy, Occupational Therapy, Aide-office to call with visit times. If you have questions please call [phone] or [phone]. Contact information: [place] [address] [phone]          Allergies  Allergen Reactions  . Amoxicillin Other (See Comments)    Very weak Has patient had a PCN reaction causing immediate rash, facial/tongue/throat swelling, SOB or  lightheadedness with hypotension: no Has patient had a PCN reaction causing severe rash involving mucus membranes or skin necrosis: no Has patient had a PCN reaction that required hospitalization: no Has patient had a PCN reaction occurring within the last 10 years: no If all of the above answers are "NO", then may proceed with Cephalosporin use.   . Diltiazem   . Diltiazem Hcl Other (See Comments)    Reaction unknown  . Losartan Potassium Other (See Comments)    Potassium level increased drastically  . Potassium-Containing Compounds Other (See Comments)    Pt is very sensitive to potassium products. Her Potassium rises very quickly and takes a long time to come down.  . Sulfonamide Derivatives Other (See Comments)    Reaction unknown  .  Zetia [Ezetimibe] Other (See Comments)    Weakness  . Penicillins Rash    DID THE REACTION INVOLVE: Swelling of the face/tongue/throat, SOB, or low BP? Unknown Sudden or severe rash/hives, skin peeling, or the inside of the mouth or nose? Unknown Did it require medical treatment? Unknown When did it last happen?unknown If all above answers are "NO", may proceed with cephalosporin use.     Consultations:  Cardiology  Procedures/Studies: DG Chest 2 View  Result Date: 06/26/2019 CLINICAL DATA:  Congestion, shortness of breath, chest pain EXAM: CHEST - 2 VIEW COMPARISON:  05/03/2019 FINDINGS: Left pacer remains in place, unchanged. Bibasilar atelectasis. Heart is normal size. No effusions or edema. No acute bony abnormality. IMPRESSION: Bibasilar atelectasis. Electronically Signed   By: Rolm Baptise M.D.   On: 06/26/2019 19:22   DG Chest Port 1 View  Result Date: 06/28/2019 CLINICAL DATA:  Chest pains with cough. EXAM: PORTABLE CHEST 1 VIEW COMPARISON:  CT 05/04/2019.  Chest x-ray 05/03/2019. FINDINGS: Cardiac pacer with lead tips over the right atrium right ventricle. Heart size normal. Bibasilar subsegmental atelectasis and or scarring again noted. Mild bibasilar infiltrates cannot be excluded. No pleural effusion or pneumothorax. IMPRESSION: 1. Cardiac pacer with lead tips in right atrium right ventricle. Heart size normal. 2. Mild bibasilar subsegmental atelectasis and or scarring. Mild bibasilar infiltrates cannot be excluded. Electronically Signed   By: Marcello Moores  Register   On: 06/28/2019 08:33   ECHOCARDIOGRAM COMPLETE  Result Date: 06/28/2019    ECHOCARDIOGRAM REPORT   Patient Name:   GODDESS GEBBIA Date of Exam: 06/28/2019 Medical Rec #:  [id number]        Height:       63.0 in Accession #:    [id number]       Weight:       162.3 lb Date of Birth:  1927-09-25        BSA:          1.769 m Patient Age:    86 years         BP:           108/52 mmHg Patient Gender: F                HR:            65 bpm. Exam Location:  Inpatient Procedure: 2D Echo Indications:    Elevated Troponin  History:        Patient has prior history of Echocardiogram examinations, most                 recent 05/26/2017. CHF, Pacemaker, Arrythmias:Atrial                 Fibrillation; Risk Factors:Hypertension.  Sonographer:    Mikki Santee RDCS (AE) Referring Phys: [place]  1. Left ventricular ejection fraction, by estimation, is 65 to 70%. The left ventricle has normal function. The left ventricle has no regional wall motion abnormalities. There is moderate left ventricular hypertrophy. Left ventricular diastolic parameters are consistent with Grade I diastolic dysfunction (impaired relaxation).  2. Right ventricular systolic function is normal. The right ventricular size is normal. There is mildly elevated pulmonary artery systolic pressure. The estimated right ventricular systolic pressure is 71.2 mmHg.  3. Left atrial size was mild to moderately dilated.  4. The mitral valve is normal in structure. Trivial mitral valve regurgitation. No evidence of mitral stenosis.  5. The aortic valve is normal in structure. Aortic valve regurgitation is not visualized. No aortic stenosis is present.  6. The inferior vena cava is dilated in size with >50% respiratory variability, suggesting right atrial pressure of 8 mmHg. FINDINGS  Left Ventricle: Left ventricular ejection fraction, by estimation, is 65 to 70%. The left ventricle has normal function. The left ventricle has no regional wall motion abnormalities. The left ventricular internal cavity size was normal in size. There is  moderate left ventricular hypertrophy. Left ventricular diastolic parameters are consistent with Grade I diastolic dysfunction (impaired relaxation). Right Ventricle: The right ventricular size is normal. No increase in right ventricular wall thickness. Right ventricular systolic function is normal. There is mildly elevated  pulmonary artery systolic pressure. The tricuspid regurgitant velocity is 2.69  m/s, and with an assumed right atrial pressure of 8 mmHg, the estimated right ventricular systolic pressure is 45.8 mmHg. Left Atrium: Left atrial size was mild to moderately dilated. Right Atrium: Right atrial size was normal in size. Pericardium: There is no evidence of pericardial effusion. Mitral Valve: The mitral valve is normal in structure. Normal mobility of the mitral valve leaflets. Mild mitral annular calcification. Trivial mitral valve regurgitation. No evidence of mitral valve stenosis. Tricuspid Valve: The tricuspid valve is normal in structure. Tricuspid valve regurgitation is mild . No evidence of tricuspid stenosis. Aortic Valve: The aortic valve is normal in structure. Aortic valve regurgitation is not visualized. No aortic stenosis is present. Pulmonic Valve: The pulmonic valve was normal in structure. Pulmonic valve regurgitation is trivial. No evidence of pulmonic stenosis. Aorta: The aortic root is normal in size and structure. Venous: The inferior vena cava is dilated in size with greater than 50% respiratory variability, suggesting right atrial pressure of 8 mmHg. IAS/Shunts: No atrial level shunt detected by color flow Doppler.  LEFT VENTRICLE PLAX 2D LVIDd:         4.10 cm  Diastology LVIDs:         2.50 cm  LV e' lateral:   8.59 cm/s LV PW:         1.20 cm  LV E/e' lateral: 9.6 LV IVS:        1.40 cm  LV e' medial:    6.20 cm/s LVOT diam:     2.10 cm  LV E/e' medial:  13.3 LV SV:         103 LV SV Index:   58 LVOT Area:     3.46 cm  RIGHT VENTRICLE RV S prime:     11.60 cm/s TAPSE (M-mode): 1.4 cm LEFT ATRIUM             Index       RIGHT ATRIUM           Index LA diam:  4.40 cm 2.49 cm/m  RA Area:     17.20 cm LA Vol (A2C):   68.2 ml 38.54 ml/m RA Volume:   39.20 ml  22.15 ml/m LA Vol (A4C):   52.6 ml 29.73 ml/m LA Biplane Vol: 65.7 ml 37.13 ml/m  AORTIC VALVE LVOT Vmax:   116.00 cm/s LVOT Vmean:   80.600 cm/s LVOT VTI:    0.297 m  AORTA Ao Root diam: 2.90 cm MITRAL VALVE               TRICUSPID VALVE MV Area (PHT): 2.05 cm    TR Peak grad:   28.9 mmHg MV Decel Time: 370 msec    TR Vmax:        269.00 cm/s MV E velocity: 82.60 cm/s MV A velocity: 89.70 cm/s  SHUNTS MV E/A ratio:  0.92        Systemic VTI:  0.30 m                            Systemic Diam: 2.10 cm Cherlynn Kaiser MD Electronically signed by Cherlynn Kaiser MD Signature Date/Time: 06/28/2019/3:45:05 PM    Final      Subjective: Persistent cough is the only time she feels unwell, nonproductive, notes also to have some rhinorrhea. No chest pain, dyspnea, palpitations. [place] with going home today.  Discharge Exam: Vitals:   06/28/19 0757 06/28/19 1125  BP: (!) 108/52   Pulse: 61 65  Resp: 20   Temp: 98.3 F (36.8 C)   SpO2: 96% 94%   General: Pt is alert, awake, not in acute distress Cardiovascular: RRR, S1/S2 +, no rubs, no gallops Respiratory: CTA bilaterally, no wheezing, no rhonchi Abdominal: Soft, NT, ND, bowel sounds + Extremities: No edema, no cyanosis  Labs: BNP (last 3 results) Recent Labs    06/26/19 2335  BNP 053.9*   Basic Metabolic Panel: Recent Labs  Lab 06/26/19 1720 06/27/19 0410 06/28/19 0338  NA 141 138 140  K 4.1 4.8 4.1  CL 107 105 105  CO2 22 22 23   GLUCOSE 125* 186* 107*  BUN 22 23 36*  CREATININE 1.16* 1.31* 1.45*  CALCIUM 9.0 8.8* 9.0  MG  --  1.5* 1.8   Liver Function Tests: Recent Labs  Lab 06/27/19 0410 06/28/19 0338  AST 36 20  ALT 22 18  ALKPHOS 83 76  BILITOT 1.1 0.6  PROT 5.7* 5.6*  ALBUMIN 3.6 3.6   No results for input(s): LIPASE, AMYLASE in the last 168 hours. No results for input(s): AMMONIA in the last 168 hours. CBC: Recent Labs  Lab 06/26/19 1720 06/27/19 0410 06/28/19 0338  WBC 5.5 5.3 6.9  NEUTROABS  --  4.9  --   HGB 10.9* 9.6* 9.6*  HCT 34.4* 30.0* 29.4*  MCV 103.0* 102.4* 99.7  PLT 119* 124* 125*   Cardiac Enzymes: No results for  input(s): CKTOTAL, CKMB, CKMBINDEX, TROPONINI in the last 168 hours. BNP: Invalid input(s): POCBNP CBG: No results for input(s): GLUCAP in the last 168 hours. D-Dimer No results for input(s): DDIMER in the last 72 hours. Hgb A1c No results for input(s): HGBA1C in the last 72 hours. Lipid Profile No results for input(s): CHOL, HDL, LDLCALC, TRIG, CHOLHDL, LDLDIRECT in the last 72 hours. Thyroid function studies No results for input(s): TSH, T4TOTAL, T3FREE, THYROIDAB in the last 72 hours.  Invalid input(s): FREET3 Anemia work up Recent Labs    06/28/19 0338  VITAMINB12 156*  FOLATE  6.5  FERRITIN 203  TIBC 260  IRON 28  RETICCTPCT 1.5   Urinalysis    Component Value Date/Time   COLORURINE YELLOW 08/17/2018 1520   APPEARANCEUR CLEAR 08/17/2018 1520   LABSPEC 1.008 08/17/2018 1520   PHURINE 7.0 08/17/2018 1520   GLUCOSEU NEGATIVE 08/17/2018 1520   HGBUR NEGATIVE 08/17/2018 1520   BILIRUBINUR NEGATIVE 08/17/2018 1520   KETONESUR NEGATIVE 08/17/2018 1520   PROTEINUR NEGATIVE 08/17/2018 1520   UROBILINOGEN 0.2 07/20/2013 1457   NITRITE NEGATIVE 08/17/2018 [place] 08/17/2018 1520    Microbiology Recent Results (from the past 240 hour(s))  SARS CORONAVIRUS 2 (TAT 6-24 HRS) Nasopharyngeal Nasopharyngeal Swab     Status: None   Collection Time: 06/27/19  1:58 AM   Specimen: Nasopharyngeal Swab  Result Value Ref Range Status   SARS Coronavirus 2 NEGATIVE NEGATIVE Final    Comment: (NOTE) SARS-CoV-2 target nucleic acids are NOT DETECTED. The SARS-CoV-2 RNA is generally detectable in upper and lower respiratory specimens during the acute phase of infection. Negative results do not preclude SARS-CoV-2 infection, do not rule out co-infections with other pathogens, and should not be used as the sole basis for treatment or other patient management decisions. Negative results must be combined with clinical observations, patient history, and epidemiological  information. The expected result is Negative. Fact Sheet for Patients: SugarRoll.be Fact Sheet for Healthcare Providers: https://www.woods-mathews.com/ This test is not yet approved or cleared by the [place] FDA and  has been authorized for detection and/or diagnosis of SARS-CoV-2 by FDA under an Emergency Use Authorization (EUA). This EUA will remain  in effect (meaning this test can be used) for the duration of the COVID-19 declaration under Section 56 4(b)(1) of the Act, 21 U.S.C. section 360bbb-3(b)(1), unless the authorization is terminated or revoked sooner. Performed at [facility], [place] [address], [place], [place] [address]     Time coordinating discharge: Approximately 40 minutes  Patrecia Pour, MD  [facility] 06/30/2019, 7:08 PM

## 2019-06-28 NOTE — Progress Notes (Signed)
    Spoke with daughter who is a Designer, jewellery in psychiatry.  Reviewed CT scan-multivessel coronary artery calcification Dilated pulmonary artery consistent with elevated pulmonary pressures with underlying lung disease  Discussed troponin elevation once again-demand ischemia process in the setting of her upper respiratory infection  She enjoys getting out sitting in her chair, helping weeding but is usually limited by shortness of breath with activity.  She will utilize a walker.  Given her troponin elevation, multivessel coronary calcification, I have encouraged her to resume statin therapy.  We will use lower dose based upon her age of Crestor 10 mg once a day.  Continue with aspirin.  She will be seen in outpatient setting.  Could consider stress test for further restratification, although I would not be a large proponent of invasive management given her underlying lung condition.  Daughter understands.  Echocardiogram preliminarily shows normal ejection fraction.  Reassuring.  She was appreciative.  Candee Furbish, MD

## 2019-06-28 NOTE — TOC Initial Note (Addendum)
Transition of Care [facility]) - Initial/Assessment Note    Patient Details  Name: [name] MRN: [id number] Date of Birth: January 01, 1928  Transition of Care [facility]) CM/SW Contact:    Bethena Roys, RN Phone Number: 06/28/2019, 2:04 PM  Clinical Narrative: Patient presented for shortness of breath. Prior to arrival patient was from home with support of daughter. Physical therapy recommendations for home health-and daughter wants to add occupational therapy and an aide. Case Manager called several agencies of choice: [place] and the agencies could not accept [employer] Medicare at this time. [facility] was able to accept the patient. Patient and daughter are aware of services and agreeable with agency choice. Start of care to begin within 24-48 hours post transition home. Daughter to transport patient home via private vehicle. No further needs from Case Manager at this time.                Expected Discharge Plan: [place] Barriers to Discharge: No Barriers Identified   Patient Goals and CMS Choice Patient states their goals for this hospitalization and ongoing recovery are:: to return home CMS Medicare.gov Compare Post Acute Care list provided to:: Patient Choice offered to / list presented to : Patient  Expected Discharge Plan and Services Expected Discharge Plan: [place] In-house Referral: NA Discharge Planning Services: CM Consult Post Acute Care Choice: [place] arrangements for the past 2 months: Single Family Home Expected Discharge Date: 06/28/19               DME Arranged: N/A      HH Arranged: PT, OT, Nurse's Aide HH Agency: [facility] Date HH Agency Contacted: 06/28/19 Time HH Agency Contacted: 61 Representative spoke with at [place]: [place] Arrangements/Services Living arrangements for the past 2 months: [place] with:: Adult  Children Patient language and need for interpreter reviewed:: Yes Do you feel safe going back to the place where you live?: Yes      Need for Family Participation in Patient Care: Yes (Comment) Care giver support system in place?: Yes (comment)   Criminal Activity/Legal Involvement Pertinent to Current Situation/Hospitalization: No - Comment as needed  Activities of Daily Living Home Assistive Devices/Equipment: Walker (specify type) ADL Screening (condition at time of admission) Patient's cognitive ability adequate to safely complete daily activities?: Yes Is the patient deaf or have difficulty hearing?: Yes Does the patient have difficulty seeing, even when wearing glasses/contacts?: No Does the patient have difficulty concentrating, remembering, or making decisions?: No Patient able to express need for assistance with ADLs?: Yes Does the patient have difficulty dressing or bathing?: No Independently performs ADLs?: Yes (appropriate for developmental age) Does the patient have difficulty walking or climbing stairs?: Yes Weakness of Legs: Both Weakness of Arms/Hands: None  Permission Sought/Granted Permission sought to share information with : Family Supports, Chartered certified accountant granted to share information with : Yes, Verbal Permission Granted     Permission granted to share info w AGENCY: [facility]-unable to accept the patient, Call [facility] to [facility]        Emotional Assessment Appearance:: Appears stated age   Affect (typically observed): Appropriate Orientation: : Oriented to Situation, Oriented to  Time, Oriented to Place, Oriented to Self Alcohol / Substance Use: Not Applicable Psych Involvement: No (comment)  Admission diagnosis:  Pneumonia [J18.9] Moderate persistent asthma with exacerbation [J45.41] NSTEMI (non-ST elevated myocardial  infarction) ([place]) [I21.4] Non-ST elevated myocardial infarction [facility]) [I21.4] Patient  Active Problem List   Diagnosis Date Noted  . Non-ST elevated myocardial infarction ([place]) 06/27/2019  . Hip fracture ([place]) 02/10/2018  . Closed comminuted intertrochanteric fracture of left femur ([place]) 02/10/2018  . Pacemaker 07/06/2017  . Chronic respiratory failure with hypoxia ([place]) 06/09/2017  . Pleural effusion associated with pulmonary infection 06/09/2017  . Hyperkalemia 06/09/2017  . Pleuritic chest pain 06/09/2017  . Fever 05/14/2017  . Renal insufficiency   . Constipation 04/25/2017  . Urine finding 04/25/2017  . Posterior rhinorrhea 04/25/2017  . Acute upper respiratory infection 04/25/2017  . Thrombocytopenia ([place]) 01/15/2017  . CAP (community acquired pneumonia) 01/15/2017  . Symptomatic bradycardia 06/30/2016  . Hypertensive heart disease 04/01/2016  . Bradycardia 04/01/2016  . Fall 04/01/2016  . Sinus bradycardia   . CKD (chronic kidney disease), stage III   . Normochromic normocytic anemia   . Essential hypertension   . PAF (paroxysmal atrial fibrillation) ([place])   . Palpitations 09/18/2014  . Shortness of breath 03/19/2014  . Chronic diastolic CHF (congestive heart failure), NYHA class 2 ([place]) 03/19/2014  . Fatigue 09/11/2013  . Gastroenteritis 07/20/2013  . Abdominal pain 07/20/2013  . Abnormal x-ray of abdomen 07/20/2013  . Diarrhea 07/20/2013  . Generalized weakness 07/20/2013  . Obesity (BMI 30-39.9) 07/20/2013  . Hypercholesteremia   . GERD (gastroesophageal reflux disease)   . Depression   . Asthma   . Gout   . Acute blood loss anemia 04/05/2013  . Osteoarthritis of left knee 04/04/2013  . Total knee replacement status 04/04/2013  . Pulmonary hypertension ([place]) 03/07/2013  . Anemia, unspecified 03/19/2012  . Unspecified deficiency anemia 03/19/2012  . Dizziness 07/09/2010  . Chest pain 07/30/2009  . HYPERTENSION, UNSPECIFIED 08/19/2008  . PAROXYSMAL ATRIAL FIBRILLATION 08/19/2008  . GASTROESOPHAGEAL REFLUX DISEASE, HX OF 08/19/2008   PCP:   [name], MD Pharmacy:   [id number] - [place], [place] [address] [place] [address] Phone: [phone] Fax: [phone]  [place], [facility] [address] [place] [address] [place] [address] Phone: [phone] Fax: [phone]  [facility] - [place], [place] - [address] [place] [address] Phone: [phone] Fax: [phone]  Readmission Risk Interventions No flowsheet data found.

## 2019-07-03 DIAGNOSIS — J441 Chronic obstructive pulmonary disease with (acute) exacerbation: Secondary | ICD-10-CM | POA: Diagnosis not present

## 2019-07-03 DIAGNOSIS — I129 Hypertensive chronic kidney disease with stage 1 through stage 4 chronic kidney disease, or unspecified chronic kidney disease: Secondary | ICD-10-CM | POA: Diagnosis not present

## 2019-07-03 DIAGNOSIS — I10 Essential (primary) hypertension: Secondary | ICD-10-CM | POA: Diagnosis not present

## 2019-07-03 DIAGNOSIS — R05 Cough: Secondary | ICD-10-CM | POA: Diagnosis not present

## 2019-07-03 DIAGNOSIS — E782 Mixed hyperlipidemia: Secondary | ICD-10-CM | POA: Diagnosis not present

## 2019-07-04 ENCOUNTER — Ambulatory Visit (INDEPENDENT_AMBULATORY_CARE_PROVIDER_SITE_OTHER): Payer: Medicare HMO | Admitting: *Deleted

## 2019-07-04 DIAGNOSIS — I480 Paroxysmal atrial fibrillation: Secondary | ICD-10-CM | POA: Diagnosis not present

## 2019-07-04 DIAGNOSIS — N189 Chronic kidney disease, unspecified: Secondary | ICD-10-CM | POA: Diagnosis not present

## 2019-07-04 DIAGNOSIS — I5032 Chronic diastolic (congestive) heart failure: Secondary | ICD-10-CM | POA: Diagnosis not present

## 2019-07-04 DIAGNOSIS — R001 Bradycardia, unspecified: Secondary | ICD-10-CM | POA: Diagnosis not present

## 2019-07-04 DIAGNOSIS — J45909 Unspecified asthma, uncomplicated: Secondary | ICD-10-CM | POA: Diagnosis not present

## 2019-07-04 DIAGNOSIS — I272 Pulmonary hypertension, unspecified: Secondary | ICD-10-CM | POA: Diagnosis not present

## 2019-07-04 DIAGNOSIS — M1990 Unspecified osteoarthritis, unspecified site: Secondary | ICD-10-CM | POA: Diagnosis not present

## 2019-07-04 DIAGNOSIS — D631 Anemia in chronic kidney disease: Secondary | ICD-10-CM | POA: Diagnosis not present

## 2019-07-04 DIAGNOSIS — M109 Gout, unspecified: Secondary | ICD-10-CM | POA: Diagnosis not present

## 2019-07-04 DIAGNOSIS — I214 Non-ST elevation (NSTEMI) myocardial infarction: Secondary | ICD-10-CM | POA: Diagnosis not present

## 2019-07-04 DIAGNOSIS — I130 Hypertensive heart and chronic kidney disease with heart failure and stage 1 through stage 4 chronic kidney disease, or unspecified chronic kidney disease: Secondary | ICD-10-CM | POA: Diagnosis not present

## 2019-07-04 LAB — CUP PACEART REMOTE DEVICE CHECK
Battery Remaining Longevity: 84 mo
Battery Remaining Percentage: 100 %
Brady Statistic RA Percent Paced: 71 %
Brady Statistic RV Percent Paced: 0 %
Date Time Interrogation Session: 20210526032100
Implantable Lead Implant Date: 20180524
Implantable Lead Implant Date: 20180524
Implantable Lead Location: 753859
Implantable Lead Location: 753860
Implantable Lead Model: 7740
Implantable Lead Model: 7741
Implantable Lead Serial Number: 693708
Implantable Lead Serial Number: 861246
Implantable Pulse Generator Implant Date: 20180524
Lead Channel Impedance Value: 552 Ohm
Lead Channel Impedance Value: 706 Ohm
Lead Channel Pacing Threshold Amplitude: 0.4 V
Lead Channel Pacing Threshold Amplitude: 1 V
Lead Channel Pacing Threshold Pulse Width: 0.4 ms
Lead Channel Pacing Threshold Pulse Width: 0.4 ms
Lead Channel Setting Pacing Amplitude: 2 V
Lead Channel Setting Pacing Amplitude: 2.4 V
Lead Channel Setting Pacing Pulse Width: 0.4 ms
Lead Channel Setting Sensing Sensitivity: 2 mV
Pulse Gen Serial Number: 308044

## 2019-07-06 NOTE — Progress Notes (Signed)
Remote pacemaker transmission.   

## 2019-07-07 ENCOUNTER — Other Ambulatory Visit: Payer: Self-pay

## 2019-07-07 ENCOUNTER — Encounter (HOSPITAL_COMMUNITY): Payer: Self-pay | Admitting: *Deleted

## 2019-07-07 ENCOUNTER — Ambulatory Visit (HOSPITAL_COMMUNITY)
Admission: EM | Admit: 2019-07-07 | Discharge: 2019-07-07 | Disposition: A | Discharge status: Home / Self Care | Payer: Medicare HMO

## 2019-07-07 ENCOUNTER — Ambulatory Visit (INDEPENDENT_AMBULATORY_CARE_PROVIDER_SITE_OTHER): Payer: Medicare HMO

## 2019-07-07 DIAGNOSIS — R062 Wheezing: Secondary | ICD-10-CM

## 2019-07-07 DIAGNOSIS — R05 Cough: Secondary | ICD-10-CM | POA: Diagnosis not present

## 2019-07-07 DIAGNOSIS — R0602 Shortness of breath: Secondary | ICD-10-CM

## 2019-07-07 DIAGNOSIS — R059 Cough, unspecified: Secondary | ICD-10-CM

## 2019-07-07 MED ORDER — FUROSEMIDE 20 MG PO TABS
20.0000 mg | ORAL_TABLET | Freq: Every day | ORAL | 0 refills | Status: DC
Start: 2019-07-07 — End: 2019-07-18

## 2019-07-07 NOTE — Discharge Instructions (Signed)
Begin lasix daily for 3-5 days Record daily weights  Monitor for improvement in shortness of breath and cough with this Follow up with cardiology and pulmonology as planned Follow up here or emergency room if symptoms changing/worsening

## 2019-07-07 NOTE — ED Provider Notes (Signed)
[facility]    CSN: [id number] Arrival date & time: 07/07/19  1143      History   Chief Complaint Chief Complaint  Patient presents with  . Cough  . Wheezing    HPI [name] is a 86 y.o. female history of CKD stage III, paroxysmal A. fib, combined CHF, recent hospital admission presenting today for evaluation of cough and shortness of breath.  Patient has had a cough and shortness of breath for approximately 1 month.  Initially had gone to the emergency room for evaluation of this and was noted to have elevated troponins.  Patient had clear chest x-ray in the hospital on 5/20 and elevated troponin was thought to be related to straining of the heart and myocardial mismatch.  She received injection of steroids followed by prednisone taper 4 days ago.  Daughter who presents with patient has not seen any improvement in her symptoms despite initiating the prednisone.  Has been using nebulizers without significant relief.  Cough and shortness of breath worse with lying flat. Denies fever, Maintaining appetite.  Patient does feel she has had slight increase in swelling in her legs.  HPI  Past Medical History:  Diagnosis Date  . Anemia    "I stay anemic"  . Arthritis   . Asthma    no problems recent  . Cataract   . Chronic diastolic CHF (congestive heart failure), NYHA class 2 ([place]) 03/19/2014   Echo 4/19: mod LVH, EF 55-60, no RWMA, Gr 1 DD, MAC, trivial MR, mod LAE, normal RVSF, PASP 19  . CKD (chronic kidney disease), stage III   . Complication of anesthesia    CONFUSED AFTER KNEE SURGERY  . Depression   . Diverticulitis   . DVT (deep venous thrombosis) ([place])    from birth control, left groin  . Essential hypertension   . Fibromyalgia 10 y.a.  . GERD (gastroesophageal reflux disease)   . Gout   . Hx of cardiovascular stress test    Lexiscan Myoview (8/15): apical attenuation artifact, no ischemia, EF 69% - low risk.  Marland Kitchen Hypercholesteremia   . Odynophagia     . Other abnormal glucose   . PAF (paroxysmal atrial fibrillation) ([place])   . Personal history of other diseases of digestive system   . Pneumonia    hx of  . Presence of permanent cardiac pacemaker   . Sinus bradycardia   . Skin cancer 2014   skin R elbow & face  . Symptomatic bradycardia 06/2016    Patient Active Problem List   Diagnosis Date Noted  . Non-ST elevated myocardial infarction ([place]) 06/27/2019  . Hip fracture ([place]) 02/10/2018  . Closed comminuted intertrochanteric fracture of left femur ([place]) 02/10/2018  . Pacemaker 07/06/2017  . Chronic respiratory failure with hypoxia ([place]) 06/09/2017  . Pleural effusion associated with pulmonary infection 06/09/2017  . Hyperkalemia 06/09/2017  . Pleuritic chest pain 06/09/2017  . Fever 05/14/2017  . Renal insufficiency   . Constipation 04/25/2017  . Urine finding 04/25/2017  . Posterior rhinorrhea 04/25/2017  . Acute upper respiratory infection 04/25/2017  . Thrombocytopenia ([place]) 01/15/2017  . CAP (community acquired pneumonia) 01/15/2017  . Symptomatic bradycardia 06/30/2016  . Hypertensive heart disease 04/01/2016  . Bradycardia 04/01/2016  . Fall 04/01/2016  . Sinus bradycardia   . CKD (chronic kidney disease), stage III   . Normochromic normocytic anemia   . Essential hypertension   . PAF (paroxysmal atrial fibrillation) ([place])   . Palpitations 09/18/2014  . Shortness  of breath 03/19/2014  . Chronic diastolic CHF (congestive heart failure), NYHA class 2 ([place]) 03/19/2014  . Fatigue 09/11/2013  . Gastroenteritis 07/20/2013  . Abdominal pain 07/20/2013  . Abnormal x-ray of abdomen 07/20/2013  . Diarrhea 07/20/2013  . Generalized weakness 07/20/2013  . Obesity (BMI 30-39.9) 07/20/2013  . Hypercholesteremia   . GERD (gastroesophageal reflux disease)   . Depression   . Asthma   . Gout   . Acute blood loss anemia 04/05/2013  . Osteoarthritis of left knee 04/04/2013  . Total knee replacement status 04/04/2013  .  Pulmonary hypertension ([place]) 03/07/2013  . Anemia, unspecified 03/19/2012  . Unspecified deficiency anemia 03/19/2012  . Dizziness 07/09/2010  . Chest pain 07/30/2009  . HYPERTENSION, UNSPECIFIED 08/19/2008  . PAROXYSMAL ATRIAL FIBRILLATION 08/19/2008  . GASTROESOPHAGEAL REFLUX DISEASE, HX OF 08/19/2008    Past Surgical History:  Procedure Laterality Date  . ABDOMINAL HYSTERECTOMY     partial  . APPENDECTOMY  ~55years ago  . CHOLECYSTECTOMY  10/2005   Dr. Effie Shy  . COLONOSCOPY W/ BIOPSIES  2005, 2011   Dr. Bing Plume, "balloon inside for last one at [facility]"  . INTRAMEDULLARY (IM) NAIL INTERTROCHANTERIC Left 02/10/2018   Procedure: INTRAMEDULLARY (IM) NAIL INTERTROCHANTRIC;  Surgeon: Rod Can, MD;  Location: [facility];  Service: Orthopedics;  Laterality: Left;  . IR THORACENTESIS ASP PLEURAL SPACE W/IMG GUIDE  06/13/2017  . KNEE ARTHROSCOPY Right   . LAMINECTOMY  05/2007   foraminotomy, L4-5 laminectomy  . LARYNGOSCOPY / BRONCHOSCOPY / ESOPHAGOSCOPY  2010   Dr. Benjamine Mola  . PACEMAKER IMPLANT N/A 07/01/2016   Procedure: Pacemaker Implant;  Surgeon: Evans Lance, MD;  Location: [facility];  Service: Cardiovascular;  Laterality: N/A;  . SKIN CANCER EXCISION Right ~2005   r elbow  . TOTAL KNEE ARTHROPLASTY Left 04/04/2013   Procedure: LEFT TOTAL KNEE ARTHROPLASTY;  Surgeon: Tobi Bastos, MD;  Location: [facility];  Service: Orthopedics;  Laterality: Left;  . TUBAL LIGATION    . TUMOR REMOVAL Right ~ 20 years ago   tumor in between toes    OB History   No obstetric history on file.      Home Medications    Prior to Admission medications   Medication Sig Start Date End Date Taking? Authorizing Provider  albuterol (2.5 MG/3ML) 0.083% NEBU 3 mL, albuterol (5 MG/ML) 0.5% NEBU 0.5 mL Inhale into the lungs.   Yes [provider]  alendronate (FOSAMAX) 70 MG tablet Take 70 mg by mouth once a week. On Friday 02/13/14  Yes [provider]  allopurinol  (ZYLOPRIM) 100 MG tablet Take 1 tablet (100 mg total) by mouth daily. 04/01/16  Yes Haney, Alyssa A, MD  amLODipine (NORVASC) 2.5 MG tablet Take 1 tablet (2.5 mg total) by mouth daily. Please make overdue appt with Dr. Meda Coffee before anymore refills. 1st attempt 08/09/18  Yes Dorothy Spark, MD  aspirin EC 81 MG tablet Take 81 mg by mouth daily.    Yes [provider]  chlorpheniramine-HYDROcodone (TUSSIONEX) 10-8 MG/5ML SUER Take 5 mLs by mouth every 12 (twelve) hours as needed (severe refractory cough). 06/28/19  Yes Patrecia Pour, MD  cholecalciferol (VITAMIN D) 25 MCG (1000 UNIT) tablet Take 1,000 Units by mouth daily.   Yes [provider]  famotidine (PEPCID) 20 MG tablet Take 20 mg by mouth 2 (two) times daily.   Yes [provider]  ferrous sulfate 325 (65 FE) MG tablet Take 325 mg by mouth daily.  Yes [provider]  methocarbamol (ROBAXIN) 500 MG tablet Take 500 mg by mouth 2 (two) times daily as needed for muscle spasms.   Yes [provider]  PREDNISONE PO Take by mouth. Tapered dose starting 3 days ago   Yes [provider]  rosuvastatin (CRESTOR) 10 MG tablet Take 1 tablet (10 mg total) by mouth daily. 06/28/19  Yes Jerline Pain, MD  sertraline (ZOLOFT) 50 MG tablet Take 50 mg by mouth daily.  03/15/12  Yes [provider]  acetaminophen (TYLENOL) 500 MG tablet Take 2 tablets (1,000 mg total) by mouth every 8 (eight) hours as needed for mild pain or headache. Patient taking differently: Take 500 mg by mouth every 8 (eight) hours as needed for mild pain or headache.  05/16/17   Hongalgi, Lenis Dickinson, MD  albuterol (PROVENTIL HFA;VENTOLIN HFA) 108 (90 BASE) MCG/ACT inhaler Inhale 1 puff into the lungs every 6 (six) hours as needed for wheezing or shortness of breath. 11/13/14   Dorothy Spark, MD  furosemide (LASIX) 20 MG tablet Take 1 tablet (20 mg total) by mouth daily for 5 days. 07/07/19 07/12/19  Yug Loria C, PA-C    ondansetron (ZOFRAN-ODT) 8 MG disintegrating tablet Take 8 mg by mouth every 8 (eight) hours as needed for nausea or vomiting.  03/26/19   [provider]    Family History Family History  Problem Relation Age of Onset  . Heart disease Mother   . Hypertension Mother   . Fainting Mother   . Arrhythmia Mother   . Diabetes Father   . Stroke Father   . Hypertension Father   . Cancer Brother   . Sudden death Brother   . Heart attack Brother   . Cancer Sister   . Diabetes Sister   . Asthma Sister     Social History Social History   Tobacco Use  . Smoking status: Former Smoker    Years: 10.00    Types: Cigarettes    Quit date: 02/08/1973    Years since quitting: 46.4  . Smokeless tobacco: Never Used  Substance Use Topics  . Alcohol use: No  . Drug use: No     Allergies   Amoxicillin, Diltiazem, Diltiazem hcl, Losartan potassium, Potassium-containing compounds, Sulfonamide derivatives, Zetia [ezetimibe], and Penicillins   Review of Systems Review of Systems  Constitutional: Negative for activity change, appetite change, chills, fatigue and fever.  HENT: Positive for sore throat. Negative for congestion, ear pain, rhinorrhea, sinus pressure and trouble swallowing.   Eyes: Negative for discharge and redness.  Respiratory: Positive for cough and shortness of breath. Negative for chest tightness.   Cardiovascular: Negative for chest pain.  Gastrointestinal: Negative for abdominal pain, diarrhea, nausea and vomiting.  Musculoskeletal: Negative for myalgias.  Skin: Negative for rash.  Neurological: Negative for dizziness, light-headedness and headaches.     Physical Exam Triage Vital Signs ED Triage Vitals  Enc Vitals Group     BP 07/07/19 1219 (!) 134/53     Pulse Rate 07/07/19 1219 60     Resp 07/07/19 1219 12     Temp 07/07/19 1219 98.5 F (36.9 C)     Temp Source 07/07/19 1219 Oral     SpO2 07/07/19 1219 97 %     Weight --      Height --      Head  Circumference --      Peak Flow --      Pain Score 07/07/19 1223 0  Pain Loc --      Pain Edu? --      Excl. in [place]? --    No data found.  Updated Vital Signs BP (!) 134/53 Comment: family reports recently restarting HTN med  Pulse 60   Temp 98.5 F (36.9 C) (Oral)   Resp 12   SpO2 97%   Visual Acuity Right Eye Distance:   Left Eye Distance:   Bilateral Distance:    Right Eye Near:   Left Eye Near:    Bilateral Near:     Physical Exam Vitals and nursing note reviewed.  Constitutional:      Appearance: She is well-developed.     Comments: No acute distress  HENT:     Head: Normocephalic and atraumatic.     Ears:     Comments: Bilateral ears without tenderness to palpation of external auricle, tragus and mastoid, EAC's without erythema or swelling, TM's with good bony landmarks and cone of light. Non erythematous.     Nose: Nose normal.     Mouth/Throat:     Comments: Oral mucosa pink and moist, no tonsillar enlargement or exudate. Posterior pharynx patent and nonerythematous, no uvula deviation or swelling. Normal phonation. Eyes:     Conjunctiva/sclera: Conjunctivae normal.  Cardiovascular:     Rate and Rhythm: Normal rate and regular rhythm.  Pulmonary:     Effort: Pulmonary effort is normal. No respiratory distress.     Comments: Breathing comfortably at rest, no wheezing auscultated, crackles noted to bilateral bases   Abdominal:     General: There is no distension.  Musculoskeletal:        General: Normal range of motion.     Cervical back: Neck supple.     Comments: Lower legs with 1+ edema bilaterally  Skin:    General: Skin is warm and dry.  Neurological:     Mental Status: She is alert and oriented to person, place, and time.      UC Treatments / Results  Labs (all labs ordered are listed, but only abnormal results are displayed) Labs Reviewed - No data to display  EKG   Radiology DG Chest 2 View  Result Date: 07/07/2019 CLINICAL  DATA:  Cough, shortness of breath and wheezing for 1 month. EXAM: CHEST - 2 VIEW COMPARISON:  06/28/2019 and prior radiographs FINDINGS: Cardiomegaly and LEFT-sided pacemaker are unchanged. Mild interstitial prominence and mild bibasilar atelectasis again noted. There is no evidence of focal airspace disease, pulmonary edema, suspicious pulmonary nodule/mass, pleural effusion, or pneumothorax. No acute bony abnormalities are identified. IMPRESSION: Cardiomegaly with unchanged mild interstitial prominence and bibasilar atelectasis. Electronically Signed   By: Margarette Canada M.D.   On: 07/07/2019 13:21    Procedures Procedures (including critical care time)  Medications Ordered in UC Medications - No data to display  Initial Impression / Assessment and Plan / UC Course  I have reviewed the triage vital signs and the nursing notes.  Pertinent labs & imaging results that were available during my care of the patient were reviewed by me and considered in my medical decision making (see chart for details).     Chest x-ray stable, no new pneumonia or effusion noted.  Mild interstitial prominence along with worsening symptoms with lying flat may be suggestive of more CHF etiology.  Discussed with Dr. Chauncy Passy today and recommended 20 mg Lasix daily x5 days as trial with daily weight monitoring.  Follow-up with pulmonology and cardiology as planned.  Discussed strict return precautions.  Patient verbalized understanding and is agreeable with plan.  Final Clinical Impressions(s) / UC Diagnoses   Final diagnoses:  Cough  Shortness of breath     Discharge Instructions     Begin lasix daily for 3-5 days Record daily weights  Monitor for improvement in shortness of breath and cough with this Follow up with cardiology and pulmonology as planned Follow up here or emergency room if symptoms changing/worsening    ED Prescriptions    Medication Sig Dispense Auth. Provider   furosemide (LASIX) 20 MG tablet  Take 1 tablet (20 mg total) by mouth daily for 5 days. 5 tablet Daoud Lobue, [place] C, PA-C     PDMP not reviewed this encounter.   Janith Lima, [place] 07/07/19 1439

## 2019-07-07 NOTE — ED Triage Notes (Addendum)
Per family, c/o cough and wheezing x past 4 wks with progressive worsening.  Denies fevers.  PCP encouraged family to bring pt to [facility] to ensure lungs are clear.  Got steroid injection 4 days ago along with Rx cough med without relief.  Has appt with pulmonologist next wk.  Pt has received both Covid vaccinations, and had negative Covid test in past week per family.

## 2019-07-10 DIAGNOSIS — I5032 Chronic diastolic (congestive) heart failure: Secondary | ICD-10-CM | POA: Diagnosis not present

## 2019-07-10 DIAGNOSIS — J45909 Unspecified asthma, uncomplicated: Secondary | ICD-10-CM | POA: Diagnosis not present

## 2019-07-10 DIAGNOSIS — M1990 Unspecified osteoarthritis, unspecified site: Secondary | ICD-10-CM | POA: Diagnosis not present

## 2019-07-10 DIAGNOSIS — I130 Hypertensive heart and chronic kidney disease with heart failure and stage 1 through stage 4 chronic kidney disease, or unspecified chronic kidney disease: Secondary | ICD-10-CM | POA: Diagnosis not present

## 2019-07-10 DIAGNOSIS — I214 Non-ST elevation (NSTEMI) myocardial infarction: Secondary | ICD-10-CM | POA: Diagnosis not present

## 2019-07-10 DIAGNOSIS — M109 Gout, unspecified: Secondary | ICD-10-CM | POA: Diagnosis not present

## 2019-07-10 DIAGNOSIS — I480 Paroxysmal atrial fibrillation: Secondary | ICD-10-CM | POA: Diagnosis not present

## 2019-07-10 DIAGNOSIS — N189 Chronic kidney disease, unspecified: Secondary | ICD-10-CM | POA: Diagnosis not present

## 2019-07-10 DIAGNOSIS — I272 Pulmonary hypertension, unspecified: Secondary | ICD-10-CM | POA: Diagnosis not present

## 2019-07-10 DIAGNOSIS — D631 Anemia in chronic kidney disease: Secondary | ICD-10-CM | POA: Diagnosis not present

## 2019-07-11 ENCOUNTER — Other Ambulatory Visit: Payer: Self-pay

## 2019-07-11 ENCOUNTER — Ambulatory Visit: Payer: Medicare HMO | Admitting: Acute Care

## 2019-07-11 ENCOUNTER — Encounter: Payer: Self-pay | Admitting: Acute Care

## 2019-07-11 VITALS — BP 130/70 | HR 65 | Temp 98.9°F | Ht 63.0 in | Wt 159.6 lb

## 2019-07-11 DIAGNOSIS — J329 Chronic sinusitis, unspecified: Secondary | ICD-10-CM | POA: Diagnosis not present

## 2019-07-11 DIAGNOSIS — I5032 Chronic diastolic (congestive) heart failure: Secondary | ICD-10-CM

## 2019-07-11 DIAGNOSIS — I2721 Secondary pulmonary arterial hypertension: Secondary | ICD-10-CM

## 2019-07-11 DIAGNOSIS — I2510 Atherosclerotic heart disease of native coronary artery without angina pectoris: Secondary | ICD-10-CM

## 2019-07-11 DIAGNOSIS — J45909 Unspecified asthma, uncomplicated: Secondary | ICD-10-CM

## 2019-07-11 DIAGNOSIS — I214 Non-ST elevation (NSTEMI) myocardial infarction: Secondary | ICD-10-CM | POA: Diagnosis not present

## 2019-07-11 LAB — BASIC METABOLIC PANEL
BUN: 37 mg/dL — ABNORMAL HIGH (ref 6–23)
CO2: 30 mEq/L (ref 19–32)
Calcium: 9.6 mg/dL (ref 8.4–10.5)
Chloride: 99 mEq/L (ref 96–112)
Creatinine, Ser: 1.42 mg/dL — ABNORMAL HIGH (ref 0.40–1.20)
GFR: 34.75 mL/min — ABNORMAL LOW (ref >60.00)
Glucose, Bld: 115 mg/dL — ABNORMAL HIGH (ref 70–99)
Potassium: 4.5 mEq/L (ref 3.5–5.1)
Sodium: 137 mEq/L (ref 135–145)

## 2019-07-11 LAB — MAGNESIUM: Magnesium: 1.5 mg/dL (ref 1.5–2.5)

## 2019-07-11 MED ORDER — FUROSEMIDE 20 MG PO TABS: 20.0000 mg | ORAL_TABLET | ORAL | 0 refills | Status: DC | PRN

## 2019-07-11 MED ORDER — MONTELUKAST SODIUM 10 MG PO TABS
10.0000 mg | ORAL_TABLET | Freq: Every day | ORAL | 11 refills | Status: DC
Start: 2019-07-11 — End: 2020-08-07

## 2019-07-11 MED ORDER — BREO ELLIPTA 200-25 MCG/INH IN AEPB: 1.0000 | INHALATION_SPRAY | Freq: Every day | RESPIRATORY_TRACT | 0 refills | Status: DC

## 2019-07-11 MED ORDER — MONTELUKAST SODIUM 10 MG PO TABS: 10.0000 mg | ORAL_TABLET | Freq: Every day | ORAL | 11 refills | Status: DC

## 2019-07-11 NOTE — Progress Notes (Signed)
Please call patient ( or her granddaughter) and let her know her her  magnesium level is the low range of normal. Have her eat foods like whole wheat, quinoa, spinach,almonds, peanuts, dark chocolate,Black beans, [place], and cultured yogurt to help build stores.  Creatinine which reflects kidney function is 1.42, which is about the same value as when checked 5/20.  Potassium is 4.5, which is WNL.  As we discussed, Lasix 20 mg x 1 for weight gain of 3 pounds.  Weigh in am after getting up and voiding. Close follow up with cardiology.

## 2019-07-11 NOTE — Patient Instructions (Addendum)
It is good to see you today. Follow up with cardiology 6/16 as is scheduled.  We will check labs today ( BMET and MAG) We will call the cardiology clinic and see if they can see you sooner.  We will send in a prescription for Singulair 10 mg once daily Please check with insurance and see which Symbicort Equivalent is preferred on her health plan.  We will send in a prescription for Lasix 20 mg ( 5 tablets) . Weigh daily Use one 20 mg tablet daily  for weight increase of 3 pounds as needed. Follow up with cards.  Follow low salt diet. We will give you a Breo sample This is a combination inhaled corticosteroid and Long acting Beta Agonist.  Use I puff once daily Rinse mouth after use.  You can add Delsym Cough medication 1 teaspoon every 12 hours as needed for cough. Follow up in 2 weeks to see how you are doing on the [facility].  Please contact office for sooner follow up if symptoms do not improve or worsen or seek emergency care

## 2019-07-11 NOTE — Progress Notes (Signed)
History of Present Illness [name] is a 86 y.o. female former smoker ( Quit [phone] with a 10 pack year smoking history) with history of CKD stage III, paroxysmal A. fib, combined CHF, and dyspnea.  Most recent CT Chest 04/2019 shows dilated main pulmonary artery, suggesting pulmonary HTN 2/2 lung disease  as cause of dyspnea  She is followed by Dr. Halford Chessman  ED Visit 5/31/ 2021 for worsening dyspnea >> felt to be heart failure  HPI [name] is a 86 y.o. female history of CKD stage III, paroxysmal A. fib, combined CHF, recent hospital admission 5/18-5/20 with shortness of breath and cough for 3-4 weeks which had been progressively worsening. In the ED she was found to have an elevated troponin. .CT Chest 04/2019 confirms dilated main pulmonary artery and suspected underlying PAH, with notation of underlying fibrotic lung  disease.Cardiology was consulted, felt elevated troponin was due to myocardial demand mismatch due to URI since CXR was clear. Cardiology recommended discharge on 5/20 with patient reporting improving symptoms. She was seen again  07/09/2019 for evaluation of cough and shortness of breath. At [facility]. She received injection of steroids followed by prednisone taper 4 days prior.  Daughter who presented with patient had not seen any improvement in her symptoms despite initiating the prednisone.  Has been using nebulizers without significant relief.  Cough and shortness of breath worse with lying flat. She Denied fever,had been  Maintaining appetite.  Patient  felt she had had slight increase in swelling in her legs. Chest x-ray was stable, no new pneumonia or effusion noted.  Mild interstitial prominence along with worsening symptoms with lying flat was felt to be suggestive of more of a  CHF etiology.  She was treated with lasix 20 mg daily x 5 days, with daily weight monitoring,  And was  told to follow up with cards and pulmonary as scheduled.   Maintenance  : Wixela>> patient has been unable to afford, she is not taking any maintenence inhaler..    07/11/2019 Hospital Follow Up: Pt. Presents for follow up. She was most recently seen in the [facility] 07/07/2019 as noted above for dyspnea that did not improve with prednisone. She was treated with lasix 20 mg x 5 days, and daily weights for trending. She has been eating bananas  To replete her potassium.  Per her granddaughter she has dropped 7-8 pounds with the additional lasix over the last 5 days. The last day of lasix was today 6/2.They cough has improved. She does still have a cough, but it is much better than prior to the lasix. . She is coughing up yellow secretions. She is asking for tussinex for her cough, which I think may be related to her heart failure. She states she does have some sinus congestion x 6 months. She has clear to white secretions from her nares.She has been using Flonase for this as needed.She has minimal edema in her ankles. She is in NAD today , with oxygen saturations of 96% on RA.  Last documented  creatinine was 1.45. Patient is followed by Dr. Carollee Leitz.      Test Results:  [facility] BMET 07/11/2019    Component Value Date/Time   NA 137 07/11/2019 1508   NA 142 10/27/2018 1348   K 4.5 07/11/2019 1508   CL 99 07/11/2019 1508   CO2 30 07/11/2019 1508   GLUCOSE 115 (H) 07/11/2019 1508   BUN 37 (H) 07/11/2019 1508  BUN 25 10/27/2018 1348   CREATININE 1.42 (H) 07/11/2019 1508   CALCIUM 9.6 07/11/2019 1508   GFRNONAA 32 (L) 06/28/2019 0338   GFRAA 37 (L) 06/28/2019 0338   Magnesium 07/11/2019 1.5  Troponin HS>>  06/26/2019>> 1,054 06/27/2019>> 951 06/27/2019 >> 872   CXR 07/09/2019 Cardiomegaly with unchanged mild interstitial prominence and bibasilar atelectasis.  CXR 06/28/2019 Cardiac pacer with lead tips in right atrium right ventricle. Heart size normal. Mild bibasilar subsegmental atelectasis and or scarring. Mild bibasilar infiltrates  cannot be excluded.  Echo 06/28/2019 Left ventricular ejection fraction, by estimation, is 65 to 70%. The  left ventricle has normal function. The left ventricle has no regional  wall motion abnormalities. There is moderate left ventricular hypertrophy.  Left ventricular diastolic  parameters are consistent with Grade I diastolic dysfunction (impaired  relaxation).  Right ventricular systolic function is normal. The right ventricular  size is normal. There is mildly elevated pulmonary artery systolic  pressure. The estimated right ventricular systolic pressure is 40.9 mmHg.  Left atrial size was mild to moderately dilated.  The mitral valve is normal in structure. Trivial mitral valve  regurgitation. No evidence of mitral stenosis.  The aortic valve is normal in structure. Aortic valve regurgitation is  not visualized. No aortic stenosis is present.  The inferior vena cava is dilated in size with >50% respiratory  variability, suggesting right atrial pressure of 8 mmHg.   CT Chest 04/2019 Cardiomegaly with diffuse 3 vessel coronary artery calcifications and aortic atherosclerosis. Scattered fine subpleural reticular densities involving both lungs, suspected to reflect a degree of underlying fibrotic lung disease, similar to previous. Dilated main pulmonary artery, suggesting underlying pulmonary hypertension.   Pulmonary tests: CT chest 06/06/08 >> BTX lower lobes V/Q scan 05/16/17 >> low probability for PE Spirometry 06/03/17 >> FEV1 1.23 (78%), FEV1% 86 HRCT chest 06/03/17 >> loculated Lt effusion, small Rt effusion, subpleural reticulation and traction BTX (reviewed by me) Lt thoracentesis 06/13/17 >> 600 ml fluid, glucose 101, LDH 273, protein 3.6, WBC 4226 (73% N), cytology negative, culture negative  Cardiac tests: Echo 03/08/17 >> EF 55 to 60%, grade 1 DD V/Q scan 05/17/17 >> no DVT   CBC Latest Ref Rng & Units 06/28/2019 06/27/2019 06/26/2019  WBC 4.0 - 10.5 K/uL 6.9 5.3 5.5    Hemoglobin 12.0 - 15.0 g/dL 9.6(L) 9.6(L) 10.9(L)  Hematocrit 36.0 - 46.0 % 29.4(L) 30.0(L) 34.4(L)  Platelets 150 - 400 K/uL 125(L) 124(L) 119(L)    BMP Latest Ref Rng & Units 07/11/2019 06/28/2019 06/27/2019  Glucose 70 - 99 mg/dL 115(H) 107(H) 186(H)  BUN 6 - 23 mg/dL 37(H) 36(H) 23  Creatinine 0.40 - 1.20 mg/dL 1.42(H) 1.45(H) 1.31(H)  BUN/Creat Ratio 12 - 28 - - -  Sodium 135 - 145 mEq/L 137 140 138  Potassium 3.5 - 5.1 mEq/L 4.5 4.1 4.8  Chloride 96 - 112 mEq/L 99 105 105  CO2 19 - 32 mEq/L _0 Calcium 8.4 - 10.5 mg/dL 9.6 9.0 8.8(L)    BNP    Component Value Date/Time   BNP 356.9 (H) 06/26/2019 2335    ProBNP    Component Value Date/Time   PROBNP 1,456 (H) 07/06/2017 1000   PROBNP 113.0 (H) 08/24/2013 0940    PFT    Component Value Date/Time   FEV1PRE 1.23 06/03/2017 1057   FEV1POST 1.15 06/03/2017 1057   FVCPRE 1.43 06/03/2017 1057   FVCPOST 1.40 06/03/2017 1057   DLCOUNC 9.36 06/03/2017 1057   PREFEV1FVCRT 86 06/03/2017  1057   PSTFEV1FVCRT 82 06/03/2017 1057    DG Chest 2 View  Result Date: 07/07/2019 CLINICAL DATA:  Cough, shortness of breath and wheezing for 1 month. EXAM: CHEST - 2 VIEW COMPARISON:  06/28/2019 and prior radiographs FINDINGS: Cardiomegaly and LEFT-sided pacemaker are unchanged. Mild interstitial prominence and mild bibasilar atelectasis again noted. There is no evidence of focal airspace disease, pulmonary edema, suspicious pulmonary nodule/mass, pleural effusion, or pneumothorax. No acute bony abnormalities are identified. IMPRESSION: Cardiomegaly with unchanged mild interstitial prominence and bibasilar atelectasis. Electronically Signed   By: Margarette Canada M.D.   On: 07/07/2019 13:21   DG Chest 2 View  Result Date: 06/26/2019 CLINICAL DATA:  Congestion, shortness of breath, chest pain EXAM: CHEST - 2 VIEW COMPARISON:  05/03/2019 FINDINGS: Left pacer remains in place, unchanged. Bibasilar atelectasis. Heart is normal size. No effusions  or edema. No acute bony abnormality. IMPRESSION: Bibasilar atelectasis. Electronically Signed   By: Rolm Baptise M.D.   On: 06/26/2019 19:22   DG Chest Port 1 View  Result Date: 06/28/2019 CLINICAL DATA:  Chest pains with cough. EXAM: PORTABLE CHEST 1 VIEW COMPARISON:  CT 05/04/2019.  Chest x-ray 05/03/2019. FINDINGS: Cardiac pacer with lead tips over the right atrium right ventricle. Heart size normal. Bibasilar subsegmental atelectasis and or scarring again noted. Mild bibasilar infiltrates cannot be excluded. No pleural effusion or pneumothorax. IMPRESSION: 1. Cardiac pacer with lead tips in right atrium right ventricle. Heart size normal. 2. Mild bibasilar subsegmental atelectasis and or scarring. Mild bibasilar infiltrates cannot be excluded. Electronically Signed   By: Marcello Moores  Register   On: 06/28/2019 08:33   ECHOCARDIOGRAM COMPLETE  Result Date: 06/28/2019    ECHOCARDIOGRAM REPORT   Patient Name:   [name] Date of Exam: 06/28/2019 Medical Rec #:  [id number]        Height:       63.0 in Accession #:    [id number]       Weight:       162.3 lb Date of Birth:  [birth year]        BSA:          1.769 m Patient Age:    83 years         BP:           108/52 mmHg Patient Gender: F                HR:           65 bpm. Exam Location:  Inpatient Procedure: 2D Echo Indications:    Elevated Troponin  History:        Patient has prior history of Echocardiogram examinations, most                 recent 05/26/2017. CHF, Pacemaker, Arrythmias:Atrial                 Fibrillation; Risk Factors:Hypertension.  Sonographer:    Mikki Santee RDCS (AE) Referring Phys: [place]  1. Left ventricular ejection fraction, by estimation, is 65 to 70%. The left ventricle has normal function. The left ventricle has no regional wall motion abnormalities. There is moderate left ventricular hypertrophy. Left ventricular diastolic parameters are consistent with Grade I diastolic dysfunction (impaired  relaxation).  2. Right ventricular systolic function is normal. The right ventricular size is normal. There is mildly elevated pulmonary artery systolic pressure. The estimated right ventricular systolic pressure is 37.6 mmHg.  3. Left atrial size was mild to  moderately dilated.  4. The mitral valve is normal in structure. Trivial mitral valve regurgitation. No evidence of mitral stenosis.  5. The aortic valve is normal in structure. Aortic valve regurgitation is not visualized. No aortic stenosis is present.  6. The inferior vena cava is dilated in size with >50% respiratory variability, suggesting right atrial pressure of 8 mmHg. FINDINGS  Left Ventricle: Left ventricular ejection fraction, by estimation, is 65 to 70%. The left ventricle has normal function. The left ventricle has no regional wall motion abnormalities. The left ventricular internal cavity size was normal in size. There is  moderate left ventricular hypertrophy. Left ventricular diastolic parameters are consistent with Grade I diastolic dysfunction (impaired relaxation). Right Ventricle: The right ventricular size is normal. No increase in right ventricular wall thickness. Right ventricular systolic function is normal. There is mildly elevated pulmonary artery systolic pressure. The tricuspid regurgitant velocity is 2.69  m/s, and with an assumed right atrial pressure of 8 mmHg, the estimated right ventricular systolic pressure is 92.4 mmHg. Left Atrium: Left atrial size was mild to moderately dilated. Right Atrium: Right atrial size was normal in size. Pericardium: There is no evidence of pericardial effusion. Mitral Valve: The mitral valve is normal in structure. Normal mobility of the mitral valve leaflets. Mild mitral annular calcification. Trivial mitral valve regurgitation. No evidence of mitral valve stenosis. Tricuspid Valve: The tricuspid valve is normal in structure. Tricuspid valve regurgitation is mild . No evidence of tricuspid  stenosis. Aortic Valve: The aortic valve is normal in structure. Aortic valve regurgitation is not visualized. No aortic stenosis is present. Pulmonic Valve: The pulmonic valve was normal in structure. Pulmonic valve regurgitation is trivial. No evidence of pulmonic stenosis. Aorta: The aortic root is normal in size and structure. Venous: The inferior vena cava is dilated in size with greater than 50% respiratory variability, suggesting right atrial pressure of 8 mmHg. IAS/Shunts: No atrial level shunt detected by color flow Doppler.  LEFT VENTRICLE PLAX 2D LVIDd:         4.10 cm  Diastology LVIDs:         2.50 cm  LV e' lateral:   8.59 cm/s LV PW:         1.20 cm  LV E/e' lateral: 9.6 LV IVS:        1.40 cm  LV e' medial:    6.20 cm/s LVOT diam:     2.10 cm  LV E/e' medial:  13.3 LV SV:         103 LV SV Index:   58 LVOT Area:     3.46 cm  RIGHT VENTRICLE RV S prime:     11.60 cm/s TAPSE (M-mode): 1.4 cm LEFT ATRIUM             Index       RIGHT ATRIUM           Index LA diam:        4.40 cm 2.49 cm/m  RA Area:     17.20 cm LA Vol (A2C):   68.2 ml 38.54 ml/m RA Volume:   39.20 ml  22.15 ml/m LA Vol (A4C):   52.6 ml 29.73 ml/m LA Biplane Vol: 65.7 ml 37.13 ml/m  AORTIC VALVE LVOT Vmax:   116.00 cm/s LVOT Vmean:  80.600 cm/s LVOT VTI:    0.297 m  AORTA Ao Root diam: 2.90 cm MITRAL VALVE               TRICUSPID VALVE  MV Area (PHT): 2.05 cm    TR Peak grad:   28.9 mmHg MV Decel Time: 370 msec    TR Vmax:        269.00 cm/s MV E velocity: 82.60 cm/s MV A velocity: 89.70 cm/s  SHUNTS MV E/A ratio:  0.92        Systemic VTI:  0.30 m                            Systemic Diam: 2.10 cm Cherlynn Kaiser MD Electronically signed by Cherlynn Kaiser MD Signature Date/Time: 06/28/2019/3:45:05 PM    Final    CUP PACEART REMOTE DEVICE CHECK  Result Date: 07/04/2019 Scheduled remote reviewed. Normal device function.  1 ATR <1 min. 3 NST events show short PAT. Next remote 91 days. [employer]    Past medical hx Past Medical  History:  Diagnosis Date  . Anemia    "I stay anemic"  . Arthritis   . Asthma    no problems recent  . Cataract   . Chronic diastolic CHF (congestive heart failure), NYHA class 2 ([place]) 03/19/2014   Echo 4/19: mod LVH, EF 55-60, no RWMA, Gr 1 DD, MAC, trivial MR, mod LAE, normal RVSF, PASP 19  . CKD (chronic kidney disease), stage III   . Complication of anesthesia    CONFUSED AFTER KNEE SURGERY  . Depression   . Diverticulitis   . DVT (deep venous thrombosis) ([place])    from birth control, left groin  . Essential hypertension   . Fibromyalgia 10 y.a.  . GERD (gastroesophageal reflux disease)   . Gout   . Hx of cardiovascular stress test    Lexiscan Myoview (8/15): apical attenuation artifact, no ischemia, EF 69% - low risk.  Marland Kitchen Hypercholesteremia   . Odynophagia   . Other abnormal glucose   . PAF (paroxysmal atrial fibrillation) ([place])   . Personal history of other diseases of digestive system   . Pneumonia    hx of  . Presence of permanent cardiac pacemaker   . Sinus bradycardia   . Skin cancer 2014   skin R elbow & face  . Symptomatic bradycardia 06/2016     Social History   Tobacco Use  . Smoking status: Former Smoker    Packs/day: 1.00    Years: 10.00    Pack years: 10.00    Types: Cigarettes    Quit date: 02/08/1973    Years since quitting: 46.4  . Smokeless tobacco: Never Used  Substance Use Topics  . Alcohol use: No  . Drug use: No    [name] reports that she quit smoking about 46 years ago. Her smoking use included cigarettes. She has a 10.00 pack-year smoking history. She has never used smokeless tobacco. She reports that she does not drink alcohol or use drugs.  Tobacco Cessation: Former smoker quit 1975 with a 10 pack year smoking history  Past surgical hx, Family hx, Social hx all reviewed.  Current Outpatient Medications on File Prior to Visit  Medication Sig  . acetaminophen (TYLENOL) 500 MG tablet Take 2 tablets (1,000 mg total) by mouth every  8 (eight) hours as needed for mild pain or headache. (Patient taking differently: Take 500 mg by mouth every 8 (eight) hours as needed for mild pain or headache. )  . albuterol (2.5 MG/3ML) 0.083% NEBU 3 mL, albuterol (5 MG/ML) 0.5% NEBU 0.5 mL Inhale into the lungs.  Marland Kitchen albuterol (PROVENTIL HFA;VENTOLIN HFA) 108 (  90 BASE) MCG/ACT inhaler Inhale 1 puff into the lungs every 6 (six) hours as needed for wheezing or shortness of breath.  Marland Kitchen alendronate (FOSAMAX) 70 MG tablet Take 70 mg by mouth once a week. On Friday  . allopurinol (ZYLOPRIM) 100 MG tablet Take 1 tablet (100 mg total) by mouth daily.  Marland Kitchen amLODipine (NORVASC) 2.5 MG tablet Take 1 tablet (2.5 mg total) by mouth daily. Please make overdue appt with Dr. Meda Coffee before anymore refills. 1st attempt  . aspirin EC 81 MG tablet Take 81 mg by mouth daily.   . chlorpheniramine-HYDROcodone (TUSSIONEX) 10-8 MG/5ML SUER Take 5 mLs by mouth every 12 (twelve) hours as needed (severe refractory cough).  . cholecalciferol (VITAMIN D) 25 MCG (1000 UNIT) tablet Take 1,000 Units by mouth daily.  . famotidine (PEPCID) 20 MG tablet Take 20 mg by mouth 2 (two) times daily.  . ferrous sulfate 325 (65 FE) MG tablet Take 325 mg by mouth daily.   . methocarbamol (ROBAXIN) 500 MG tablet Take 500 mg by mouth 2 (two) times daily as needed for muscle spasms.  . ondansetron (ZOFRAN-ODT) 8 MG disintegrating tablet Take 8 mg by mouth every 8 (eight) hours as needed for nausea or vomiting.   . rosuvastatin (CRESTOR) 10 MG tablet Take 1 tablet (10 mg total) by mouth daily.  . sertraline (ZOLOFT) 50 MG tablet Take 50 mg by mouth daily.   . furosemide (LASIX) 20 MG tablet Take 1 tablet (20 mg total) by mouth daily for 5 days. (Patient not taking: Reported on 07/11/2019)   No current facility-administered medications on file prior to visit.     Allergies  Allergen Reactions  . Amoxicillin Other (See Comments)    Very weak Has patient had a PCN reaction causing immediate  rash, facial/tongue/throat swelling, SOB or lightheadedness with hypotension: no Has patient had a PCN reaction causing severe rash involving mucus membranes or skin necrosis: no Has patient had a PCN reaction that required hospitalization: no Has patient had a PCN reaction occurring within the last 10 years: no If all of the above answers are "NO", then may proceed with Cephalosporin use.   . Diltiazem   . Diltiazem Hcl Other (See Comments)    Reaction unknown  . Losartan Potassium Other (See Comments)    Potassium level increased drastically  . Potassium-Containing Compounds Other (See Comments)    Pt is very sensitive to potassium products. Her Potassium rises very quickly and takes a long time to come down.  . Sulfonamide Derivatives Other (See Comments)    Reaction unknown  . Zetia [Ezetimibe] Other (See Comments)    Weakness  . Penicillins Rash    DID THE REACTION INVOLVE: Swelling of the face/tongue/throat, SOB, or low BP? Unknown Sudden or severe rash/hives, skin peeling, or the inside of the mouth or nose? Unknown Did it require medical treatment? Unknown When did it last happen?unknown If all above answers are "NO", may proceed with cephalosporin use.     Review Of Systems:  Constitutional:   No  weight loss, night sweats,  Fevers, chills, fatigue, or  lassitude.  HEENT:   No headaches,  Difficulty swallowing,  Tooth/dental problems, or  Sore throat,                No sneezing, itching, ear ache, nasal congestion, post nasal drip,   CV:  No chest pain,  Orthopnea, PND, swelling in lower extremities, anasarca, dizziness, palpitations, syncope.   GI  No heartburn, indigestion, abdominal pain,  nausea, vomiting, diarrhea, change in bowel habits, loss of appetite, bloody stools.   Resp: + shortness of breath with exertion less at rest.  + excess mucus, No productive cough,  + non-productive cough,  No coughing up of blood.  No change in color of mucus.  No wheezing.   No chest wall deformity  Skin: no rash or lesions.  GU: no dysuria, change in color of urine, no urgency or frequency.  No flank pain, no hematuria   MS:  No joint pain or swelling.  No decreased range of motion.  No back pain.  Psych:  No change in mood or affect. No depression or anxiety.  No memory loss.   Vital Signs BP 130/70 (BP Location: Right Arm, Cuff Size: Normal)   Pulse 65   Temp 98.9 F (37.2 C) (Oral)   Ht _0  (1.6 m)   Wt 159 lb 9.6 oz (72.4 kg)   SpO2 96% Comment: RA  BMI 28.27 kg/m    Physical Exam:  General- No distress,  A&Ox3, HOH, pleasant ENT: No sinus tenderness, TM clear, pale nasal mucosa, no oral exudate,no post nasal drip, no LAN Cardiac: S1, S2, regular rate and rhythm, no murmur Chest: No wheeze/ rales/ dullness; no accessory muscle use, no nasal flaring, no sternal retractions, + crackles per bases bilaterally Abd.: Soft Non-tender, ND, BS + Ext: No clubbing cyanosis, trace bilateral lower extremity edema.edema Neuro:  Physical deconditioning, MAE x 4, A&O x 3 Skin: No rashes, No lesions, warm and dry Psych: normal mood and behavior   Assessment/Plan  NSTEMI Most likely demand ischemia in setting of PAH 2/2 underlying pulmonary disease Radiologic evidence of CAD Episode of dyspnea with  CXR with Cardiomegaly / mild interstitial prominence and bibasilar atelectasis Treated with Lasix 20 mg x 5 days with improvement Plan Symptomatic management Daily weights after awakening and emptying bladder Will send an additional 5 doses of Lasix 20 mg for use only with weight gain of 3 pounds or more BMET today to evaluate renal function, potassium, and magnesium level Magnesium level today We will call you with results Follow up with Cards 6/16 or sooner  Chronic Diastolic HF Echo with normal EF 65-70% Plan  Per cards at follow up Daily weights as above Lasix 20 mg prn weight gain of 3 pounds   Asthma Cannot afford Wixela Not using any  maintenance Plan Therapeutic Trial with Breo Follow up in 2 week to assess response/ tolerence Resume Singulair 10 mg daily Delsym cough medicine, take as directed for cough  CKD III Plan Per [facility] BMET today to eval creatinine and potassium after recent lasix treatment    Magdalen Spatz, NP 07/11/2019  7:05 PM

## 2019-07-12 DIAGNOSIS — I130 Hypertensive heart and chronic kidney disease with heart failure and stage 1 through stage 4 chronic kidney disease, or unspecified chronic kidney disease: Secondary | ICD-10-CM | POA: Diagnosis not present

## 2019-07-12 DIAGNOSIS — J45909 Unspecified asthma, uncomplicated: Secondary | ICD-10-CM | POA: Diagnosis not present

## 2019-07-12 DIAGNOSIS — I5032 Chronic diastolic (congestive) heart failure: Secondary | ICD-10-CM | POA: Diagnosis not present

## 2019-07-12 DIAGNOSIS — I272 Pulmonary hypertension, unspecified: Secondary | ICD-10-CM | POA: Diagnosis not present

## 2019-07-12 DIAGNOSIS — I480 Paroxysmal atrial fibrillation: Secondary | ICD-10-CM | POA: Diagnosis not present

## 2019-07-12 DIAGNOSIS — D631 Anemia in chronic kidney disease: Secondary | ICD-10-CM | POA: Diagnosis not present

## 2019-07-12 DIAGNOSIS — M109 Gout, unspecified: Secondary | ICD-10-CM | POA: Diagnosis not present

## 2019-07-12 DIAGNOSIS — I214 Non-ST elevation (NSTEMI) myocardial infarction: Secondary | ICD-10-CM | POA: Diagnosis not present

## 2019-07-12 DIAGNOSIS — M1990 Unspecified osteoarthritis, unspecified site: Secondary | ICD-10-CM | POA: Diagnosis not present

## 2019-07-12 DIAGNOSIS — N189 Chronic kidney disease, unspecified: Secondary | ICD-10-CM | POA: Diagnosis not present

## 2019-07-12 NOTE — Progress Notes (Signed)
Reviewed and agree with assessment/plan.   Chesley Mires, MD [facility] Pulmonary/Critical Care 07/12/2019, 8:59 AM Pager:  [phone]

## 2019-07-13 ENCOUNTER — Telehealth: Payer: Self-pay | Admitting: Acute Care

## 2019-07-13 NOTE — Telephone Encounter (Signed)
Patient grand daughter [name]) called and informed that results of recent lab work were not available, done on 07/11/19. Advised we would call her as soon as they are available. Also, requesting early appointment to see cardiologist. Explained that the cardiology office would call to schedule that appointment. Family member verbalized understanding. Nothing further needed at this time.

## 2019-07-17 ENCOUNTER — Telehealth: Payer: Self-pay | Admitting: Physician Assistant

## 2019-07-17 DIAGNOSIS — M1990 Unspecified osteoarthritis, unspecified site: Secondary | ICD-10-CM | POA: Diagnosis not present

## 2019-07-17 DIAGNOSIS — M109 Gout, unspecified: Secondary | ICD-10-CM | POA: Diagnosis not present

## 2019-07-17 DIAGNOSIS — I480 Paroxysmal atrial fibrillation: Secondary | ICD-10-CM | POA: Diagnosis not present

## 2019-07-17 DIAGNOSIS — J45909 Unspecified asthma, uncomplicated: Secondary | ICD-10-CM | POA: Diagnosis not present

## 2019-07-17 DIAGNOSIS — N189 Chronic kidney disease, unspecified: Secondary | ICD-10-CM | POA: Diagnosis not present

## 2019-07-17 DIAGNOSIS — I5032 Chronic diastolic (congestive) heart failure: Secondary | ICD-10-CM | POA: Diagnosis not present

## 2019-07-17 DIAGNOSIS — I214 Non-ST elevation (NSTEMI) myocardial infarction: Secondary | ICD-10-CM | POA: Diagnosis not present

## 2019-07-17 DIAGNOSIS — I272 Pulmonary hypertension, unspecified: Secondary | ICD-10-CM | POA: Diagnosis not present

## 2019-07-17 DIAGNOSIS — D631 Anemia in chronic kidney disease: Secondary | ICD-10-CM | POA: Diagnosis not present

## 2019-07-17 DIAGNOSIS — I130 Hypertensive heart and chronic kidney disease with heart failure and stage 1 through stage 4 chronic kidney disease, or unspecified chronic kidney disease: Secondary | ICD-10-CM | POA: Diagnosis not present

## 2019-07-17 NOTE — Telephone Encounter (Signed)
New Message:    Pt have been coughing non stop for about a week, thinks she is in heart failure. Pt would like to be seen asap please. She have an appt on 07-25-19 with Estella Husk,, need something sooner.i

## 2019-07-17 NOTE — Telephone Encounter (Signed)
     I went in pt's chart to see if pt was called back.

## 2019-07-17 NOTE — Telephone Encounter (Signed)
Pts daughter is calling in to inquire on an appt for the pt this week, for ongoing cough, lower extremity edema, sob while lying flat, clear productive mucus with cough, for the last 3 weeks or greater.  No other cardiac complaints voiced on the pt.  [name] states that the pt has been to the hospital several times with this, and she is between diagnosis of URI from her lung disease, and exacerbation of CHF.  Daughter states she went to hospital on 6/2 (see notes in Epic), and they did give her lasix which seemed to help. Pt has also seen her Pulmonologist in the interim as well.  Family states the pt is in NAD at this time. Per the family, they would like for the pt to be seen this week by Dr. Meda Coffee or an APP in the office. They state the pt has no fever or any other s/s of COVID, and has been fully vaccinated. Scheduled the pt to come in and see Dr. Meda Coffee on her DOD slot for tomorrow 07/18/19 at 3:20 pm.  Advised the pts family that they need to have her here 15 mins prior to that appt.  Updated in appt notes to allow Niece [name] up with the pt, for she will need this assistance. Advised the pts family if her condition worsens over night onto her appt tomorrow, then they should seek immediate medical attention. Family educated about what warrant immediate medical attention. Daughter and family verbalized understanding and agrees with this plan. Will send this message to Dr. Meda Coffee as a general FYI, to make her aware of this plan.

## 2019-07-18 ENCOUNTER — Ambulatory Visit: Payer: Medicare HMO | Admitting: Cardiology

## 2019-07-18 ENCOUNTER — Other Ambulatory Visit: Payer: Self-pay

## 2019-07-18 ENCOUNTER — Encounter: Payer: Self-pay | Admitting: Cardiology

## 2019-07-18 VITALS — BP 132/66 | HR 66 | Ht 63.0 in | Wt 157.8 lb

## 2019-07-18 DIAGNOSIS — N390 Urinary tract infection, site not specified: Secondary | ICD-10-CM | POA: Diagnosis not present

## 2019-07-18 DIAGNOSIS — I480 Paroxysmal atrial fibrillation: Secondary | ICD-10-CM

## 2019-07-18 DIAGNOSIS — J411 Mucopurulent chronic bronchitis: Secondary | ICD-10-CM | POA: Diagnosis not present

## 2019-07-18 DIAGNOSIS — I5032 Chronic diastolic (congestive) heart failure: Secondary | ICD-10-CM | POA: Diagnosis not present

## 2019-07-18 DIAGNOSIS — I10 Essential (primary) hypertension: Secondary | ICD-10-CM

## 2019-07-18 DIAGNOSIS — I5033 Acute on chronic diastolic (congestive) heart failure: Secondary | ICD-10-CM

## 2019-07-18 DIAGNOSIS — Z950 Presence of cardiac pacemaker: Secondary | ICD-10-CM | POA: Diagnosis not present

## 2019-07-18 DIAGNOSIS — E782 Mixed hyperlipidemia: Secondary | ICD-10-CM | POA: Diagnosis not present

## 2019-07-18 DIAGNOSIS — R296 Repeated falls: Secondary | ICD-10-CM | POA: Diagnosis not present

## 2019-07-18 MED ORDER — FUROSEMIDE 40 MG PO TABS: 40.0000 mg | ORAL_TABLET | Freq: Every day | ORAL | 2 refills | Status: DC

## 2019-07-18 NOTE — Progress Notes (Signed)
Cardiology Office Note    Date:  07/18/2019   ID:  [name], DOB May 28, 1927, MRN [id number]  PCP:  [name], MD  Cardiologist: Ena Dawley, MD EPS: Cristopher Peru, MD  Chief complaint: Shortness of breath, cough  History of Present Illness:  [name] is a 86 y.o. female with history of chronic CHF (diastolic), sinus node dysfunction w/PPM, PAF , PVCs HTN, HLD, CKD (III), mild pulmonaryHTN, h/o pleural effusion requiring thora May 2019.   Was last seen in our office 10/13/2018 pacemaker follow-up and was doing well.  She has had 2 hours of atrial fib in the past but not felt to be a great systemic anticoagulation candidate though it would be considered if she had recurrence per Dr. Lovena Le.  Last device check 04/04/2019 with stable.  The patient is coming for a visit for worsening shortness of breath and cough.  Per daughter she has been coughing for several months.  She had upper respiratory infection approximately 2 months ago and her cough never resolved.  She was hospitalized on Jun 28, 2019 when she had elevated troponins.  She was consulted by cardiology they determined that it is most probably demand ischemia in the settings of upper respiratory infection atypical pneumonia.  She was seen by pulmonary physician last week and diagnosed with COPD she was started on albuterol as well as Breo inhaler with minimal improvement so far.  She was also started on low-dose Lasix 20 mg daily.  She has lost 8 pounds so far but minimal improvement.  She continues to feel short of breath, fatigue, and cough that is nonproductive.  Past Medical History:  Diagnosis Date  . Anemia    "I stay anemic"  . Arthritis   . Asthma    no problems recent  . Cataract   . Chronic diastolic CHF (congestive heart failure), NYHA class 2 ([place]) 03/19/2014   Echo 4/19: mod LVH, EF 55-60, no RWMA, Gr 1 DD, MAC, trivial MR, mod LAE, normal RVSF, PASP 19  . CKD (chronic kidney disease),  stage III   . Complication of anesthesia    CONFUSED AFTER KNEE SURGERY  . Depression   . Diverticulitis   . DVT (deep venous thrombosis) ([place])    from birth control, left groin  . Essential hypertension   . Fibromyalgia 10 y.a.  . GERD (gastroesophageal reflux disease)   . Gout   . Hx of cardiovascular stress test    Lexiscan Myoview (8/15): apical attenuation artifact, no ischemia, EF 69% - low risk.  Marland Kitchen Hypercholesteremia   . Odynophagia   . Other abnormal glucose   . PAF (paroxysmal atrial fibrillation) ([place])   . Personal history of other diseases of digestive system   . Pneumonia    hx of  . Presence of permanent cardiac pacemaker   . Sinus bradycardia   . Skin cancer 2014   skin R elbow & face  . Symptomatic bradycardia 06/2016    Past Surgical History:  Procedure Laterality Date  . ABDOMINAL HYSTERECTOMY     partial  . APPENDECTOMY  ~55years ago  . CHOLECYSTECTOMY  10/2005   Dr. Effie Shy  . COLONOSCOPY W/ BIOPSIES  2005, 2011   Dr. Bing Plume, "balloon inside for last one at [facility]"  . INTRAMEDULLARY (IM) NAIL INTERTROCHANTERIC Left 02/10/2018   Procedure: INTRAMEDULLARY (IM) NAIL INTERTROCHANTRIC;  Surgeon: Rod Can, MD;  Location: [facility];  Service: Orthopedics;  Laterality: Left;  . IR THORACENTESIS ASP PLEURAL SPACE  W/IMG GUIDE  06/13/2017  . KNEE ARTHROSCOPY Right   . LAMINECTOMY  05/2007   foraminotomy, L4-5 laminectomy  . LARYNGOSCOPY / BRONCHOSCOPY / ESOPHAGOSCOPY  2010   Dr. Benjamine Mola  . PACEMAKER IMPLANT N/A 07/01/2016   Procedure: Pacemaker Implant;  Surgeon: Evans Lance, MD;  Location: [facility];  Service: Cardiovascular;  Laterality: N/A;  . SKIN CANCER EXCISION Right ~2005   r elbow  . TOTAL KNEE ARTHROPLASTY Left 04/04/2013   Procedure: LEFT TOTAL KNEE ARTHROPLASTY;  Surgeon: Tobi Bastos, MD;  Location: [facility];  Service: Orthopedics;  Laterality: Left;  . TUBAL LIGATION    . TUMOR REMOVAL Right ~ 20 years ago   tumor in  between toes    Current Medications: Current Meds  Medication Sig  . acetaminophen (TYLENOL) 500 MG tablet Take 2 tablets (1,000 mg total) by mouth every 8 (eight) hours as needed for mild pain or headache.  . albuterol (2.5 MG/3ML) 0.083% NEBU 3 mL, albuterol (5 MG/ML) 0.5% NEBU 0.5 mL Inhale into the lungs.  Marland Kitchen albuterol (PROVENTIL HFA;VENTOLIN HFA) 108 (90 BASE) MCG/ACT inhaler Inhale 1 puff into the lungs every 6 (six) hours as needed for wheezing or shortness of breath.  Marland Kitchen alendronate (FOSAMAX) 70 MG tablet Take 70 mg by mouth once a week. On Friday  . allopurinol (ZYLOPRIM) 100 MG tablet Take 1 tablet (100 mg total) by mouth daily.  Marland Kitchen amLODipine (NORVASC) 2.5 MG tablet Take 1 tablet (2.5 mg total) by mouth daily. Please make overdue appt with Dr. Meda Coffee before anymore refills. 1st attempt  . aspirin EC 81 MG tablet Take 81 mg by mouth daily.   . cholecalciferol (VITAMIN D) 25 MCG (1000 UNIT) tablet Take 1,000 Units by mouth daily.  . famotidine (PEPCID) 20 MG tablet Take 20 mg by mouth 2 (two) times daily.  . ferrous sulfate 325 (65 FE) MG tablet Take 325 mg by mouth daily.   . fluticasone furoate-vilanterol (BREO ELLIPTA) 200-25 MCG/INH AEPB Inhale 1 puff into the lungs daily.  . methocarbamol (ROBAXIN) 500 MG tablet Take 500 mg by mouth 2 (two) times daily as needed for muscle spasms.  . montelukast (SINGULAIR) 10 MG tablet Take 1 tablet (10 mg total) by mouth at bedtime.  . ondansetron (ZOFRAN-ODT) 8 MG disintegrating tablet Take 8 mg by mouth every 8 (eight) hours as needed for nausea or vomiting.   . rosuvastatin (CRESTOR) 10 MG tablet Take 1 tablet (10 mg total) by mouth daily.  . sertraline (ZOLOFT) 50 MG tablet Take 50 mg by mouth daily.   . [DISCONTINUED] furosemide (LASIX) 20 MG tablet Take 1 tablet (20 mg total) by mouth as needed (for three pound weight gain).     Allergies:   Amoxicillin, Diltiazem, Diltiazem hcl, Losartan potassium, Potassium-containing compounds,  Sulfonamide derivatives, Zetia [ezetimibe], and Penicillins   Social History   Socioeconomic History  . Marital status: Widowed    Spouse name: Not on file  . Number of children: Not on file  . Years of education: Not on file  . Highest education level: Not on file  Occupational History  . Not on file  Tobacco Use  . Smoking status: Former Smoker    Packs/day: 1.00    Years: 10.00    Pack years: 10.00    Types: Cigarettes    Quit date: 02/08/1973    Years since quitting: 46.4  . Smokeless tobacco: Never Used  Substance and Sexual Activity  . Alcohol use: No  .  Drug use: No  . Sexual activity: Not on file  Other Topics Concern  . Not on file  Social History Narrative  . Not on file   Social Determinants of Health   Financial Resource Strain:   . Difficulty of Paying Living Expenses:   Food Insecurity:   . Worried About Charity fundraiser in the Last Year:   . Arboriculturist in the Last Year:   Transportation Needs:   . Film/video editor (Medical):   Marland Kitchen Lack of Transportation (Non-Medical):   Physical Activity:   . Days of Exercise per Week:   . Minutes of Exercise per Session:   Stress:   . Feeling of Stress :   Social Connections:   . Frequency of Communication with Friends and Family:   . Frequency of Social Gatherings with Friends and Family:   . Attends Religious Services:   . Active Member of Clubs or Organizations:   . Attends Archivist Meetings:   Marland Kitchen Marital Status:     Family History:  The patient's   family history includes Arrhythmia in her mother; Asthma in her sister; Cancer in her brother and sister; Diabetes in her father and sister; Fainting in her mother; Heart attack in her brother; Heart disease in her mother; Hypertension in her father and mother; Stroke in her father; Sudden death in her brother.   ROS:   Please see the history of present illness.    ROS All other systems reviewed and are negative.  PHYSICAL EXAM:   VS:  BP  132/66   Pulse 66   Ht _0  (1.6 m)   Wt 157 lb 12.8 oz (71.6 kg)   SpO2 94%   BMI 27.95 kg/m   Physical Exam  GEN: Well nourished, well developed, in no acute distress  Neck: no JVD, carotid bruits, or masses Cardiac:RRR; no murmurs, rubs, or gallops  Respiratory:  clear to auscultation bilaterally, normal work of breathing GI: soft, nontender, nondistended, + BS Ext: without cyanosis, clubbing, or edema, Good distal pulses bilaterally Neuro:  Alert and Oriented x 3 Psych: euthymic mood, full affect  Wt Readings from Last 3 Encounters:  07/18/19 157 lb 12.8 oz (71.6 kg)  07/11/19 159 lb 9.6 oz (72.4 kg)  06/28/19 162 lb 4.8 oz (73.6 kg)     Studies/Labs Reviewed:   EKG:  EKG is  ordered today.   Recent Labs: 08/17/2018: TSH 3.078 06/26/2019: B Natriuretic Peptide 356.9 06/28/2019: ALT 18; Hemoglobin 9.6; Platelets 125 07/11/2019: BUN 37; Creatinine, Ser 1.42; Magnesium 1.5; Potassium 4.5; Sodium 137   Lipid Panel    Component Value Date/Time   CHOL  06/07/2008 0405    118        ATP III CLASSIFICATION:  <200     mg/dL   Desirable  200-239  mg/dL   Borderline High  >=240    mg/dL   High          TRIG 85 06/07/2008 0405   HDL 35 (L) 06/07/2008 0405   CHOLHDL 3.4 06/07/2008 0405   VLDL 17 06/07/2008 0405   [place]  06/07/2008 0405    66        Total Cholesterol/HDL:CHD Risk Coronary Heart Disease Risk Table                     Men   Women  1/2 Average Risk   3.4   3.3  Average Risk  5.0   4.4  2 X Average Risk   9.6   7.1  3 X Average Risk  23.4   11.0        Use the calculated Patient Ratio above and the CHD Risk Table to determine the patient's CHD Risk.        ATP III CLASSIFICATION (LDL):  <100     mg/dL   Optimal  100-129  mg/dL   Near or Above                    Optimal  130-159  mg/dL   Borderline  160-189  mg/dL   High  >190     mg/dL   Very High    Additional studies/ records that were reviewed today include:  Echo 05/26/17 Study  Conclusions  - Left ventricle: The cavity size was normal. Wall thickness was   increased in a pattern of moderate LVH. Systolic function was   normal. The estimated ejection fraction was in the range of 55%   to 60%. Wall motion was normal; there were no regional wall   motion abnormalities. Doppler parameters are consistent with   abnormal left ventricular relaxation (grade 1 diastolic   dysfunction). - Aortic valve: There was no stenosis. - Mitral valve: Mildly calcified annulus. There was trivial   regurgitation. - Left atrium: The atrium was moderately dilated. - Right ventricle: The cavity size was normal. Pacer wire or   catheter noted in right ventricle. Systolic function was normal. - Tricuspid valve: Peak RV-RA gradient (S): 16 mm Hg. - Pulmonary arteries: PA peak pressure: 19 mm Hg (S). - Inferior vena cava: The vessel was normal in size. The   respirophasic diameter changes were in the normal range (>= 50%),   consistent with normal central venous pressure.   Impressions: - Normal LV size with moderate LV hypertrophy. EF 55-60%. Normal RV   size and systolic function. No significant valvular   abnormalities.    ASSESSMENT:    1. Chronic diastolic CHF (congestive heart failure) ([place])   2. Acute on chronic diastolic heart failure ([place])   3. Essential hypertension   4. PAF (paroxysmal atrial fibrillation) ([place])   5. Pacemaker      PLAN:  In order of problems listed above:  Acute on chronic diastolic CHF, LVEF 65 to 17%, moderate concentric LVH and grade 1 diastolic dysfunction.  I will start Lasix 40 mg daily, her BNP has been elevated at 356, creatinine 5 that is her baseline.  Elevated troponin -maximum 900 thought to be demand ischemia in the settings of upper respiratory infection, she is advised to continue taking inhalers around-the-clock as well as Lasix, we will follow her back in 2 weeks if she continues to have symptoms, will consider ordering Lexiscan  nuclear stress test.  PAF has not had a lot of episodes on pacemaker device checks but not a great candidate for anticoagulation.  Will consider if she has recurrence per Dr. Lovena Le.  Last pacer check was stable 04/04/2019. Increased palpitations recently most likely due to increased albuterol inhaler use. Regular rate and rhythm today. No changes.  Essential hypertension BP controlled.  Pacemaker followed by Dr. Lovena Le  HLD hasn't been checked in 2 yrs. But not fasting today. Can have PCP check  Medication Adjustments/Labs and Tests Ordered: Current medicines are reviewed at length with the patient today.  Concerns regarding medicines are outlined above.  Medication changes, Labs and Tests ordered today are listed in  the Patient Instructions below. Patient Instructions  Medication Instructions:   START TAKING LASIX 40 MG BY MOUTH DAILY  *If you need a refill on your cardiac medications before your next appointment, please call your pharmacy*    Follow-Up:  2 WEEKS IN THE OFFICE WITH DR. Meda Coffee ON July 27, 2019 AT 10:00 AM      Signed, Ena Dawley, MD  07/18/2019 4:31 PM    [facility] [place], [place], [place]  [address] Phone: [phone]; Fax: [phone]

## 2019-07-18 NOTE — Patient Instructions (Signed)
Medication Instructions:   START TAKING LASIX 40 MG BY MOUTH DAILY  *If you need a refill on your cardiac medications before your next appointment, please call your pharmacy*    Follow-Up:  2 WEEKS IN THE OFFICE WITH DR. Meda Coffee ON July 27, 2019 AT 10:00 AM

## 2019-07-24 DIAGNOSIS — D631 Anemia in chronic kidney disease: Secondary | ICD-10-CM | POA: Diagnosis not present

## 2019-07-24 DIAGNOSIS — I130 Hypertensive heart and chronic kidney disease with heart failure and stage 1 through stage 4 chronic kidney disease, or unspecified chronic kidney disease: Secondary | ICD-10-CM | POA: Diagnosis not present

## 2019-07-24 DIAGNOSIS — J45909 Unspecified asthma, uncomplicated: Secondary | ICD-10-CM | POA: Diagnosis not present

## 2019-07-24 DIAGNOSIS — I272 Pulmonary hypertension, unspecified: Secondary | ICD-10-CM | POA: Diagnosis not present

## 2019-07-24 DIAGNOSIS — M1990 Unspecified osteoarthritis, unspecified site: Secondary | ICD-10-CM | POA: Diagnosis not present

## 2019-07-24 DIAGNOSIS — N189 Chronic kidney disease, unspecified: Secondary | ICD-10-CM | POA: Diagnosis not present

## 2019-07-24 DIAGNOSIS — M109 Gout, unspecified: Secondary | ICD-10-CM | POA: Diagnosis not present

## 2019-07-24 DIAGNOSIS — I5032 Chronic diastolic (congestive) heart failure: Secondary | ICD-10-CM | POA: Diagnosis not present

## 2019-07-24 DIAGNOSIS — I214 Non-ST elevation (NSTEMI) myocardial infarction: Secondary | ICD-10-CM | POA: Diagnosis not present

## 2019-07-24 DIAGNOSIS — I480 Paroxysmal atrial fibrillation: Secondary | ICD-10-CM | POA: Diagnosis not present

## 2019-07-25 ENCOUNTER — Ambulatory Visit: Payer: Medicare HMO | Admitting: Physician Assistant

## 2019-07-26 DIAGNOSIS — M109 Gout, unspecified: Secondary | ICD-10-CM | POA: Diagnosis not present

## 2019-07-26 DIAGNOSIS — J45909 Unspecified asthma, uncomplicated: Secondary | ICD-10-CM | POA: Diagnosis not present

## 2019-07-26 DIAGNOSIS — I5032 Chronic diastolic (congestive) heart failure: Secondary | ICD-10-CM | POA: Diagnosis not present

## 2019-07-26 DIAGNOSIS — N189 Chronic kidney disease, unspecified: Secondary | ICD-10-CM | POA: Diagnosis not present

## 2019-07-26 DIAGNOSIS — I214 Non-ST elevation (NSTEMI) myocardial infarction: Secondary | ICD-10-CM | POA: Diagnosis not present

## 2019-07-26 DIAGNOSIS — I130 Hypertensive heart and chronic kidney disease with heart failure and stage 1 through stage 4 chronic kidney disease, or unspecified chronic kidney disease: Secondary | ICD-10-CM | POA: Diagnosis not present

## 2019-07-26 DIAGNOSIS — M1990 Unspecified osteoarthritis, unspecified site: Secondary | ICD-10-CM | POA: Diagnosis not present

## 2019-07-26 DIAGNOSIS — D631 Anemia in chronic kidney disease: Secondary | ICD-10-CM | POA: Diagnosis not present

## 2019-07-26 DIAGNOSIS — I480 Paroxysmal atrial fibrillation: Secondary | ICD-10-CM | POA: Diagnosis not present

## 2019-07-26 DIAGNOSIS — I272 Pulmonary hypertension, unspecified: Secondary | ICD-10-CM | POA: Diagnosis not present

## 2019-07-27 ENCOUNTER — Ambulatory Visit: Payer: Medicare HMO | Admitting: Cardiology

## 2019-07-30 ENCOUNTER — Ambulatory Visit: Payer: Medicare HMO | Admitting: Adult Health

## 2019-07-31 ENCOUNTER — Encounter: Payer: Self-pay | Admitting: Primary Care

## 2019-07-31 ENCOUNTER — Other Ambulatory Visit: Payer: Self-pay

## 2019-07-31 ENCOUNTER — Telehealth: Payer: Self-pay | Admitting: Primary Care

## 2019-07-31 ENCOUNTER — Ambulatory Visit: Payer: Medicare HMO | Admitting: Primary Care

## 2019-07-31 DIAGNOSIS — I5032 Chronic diastolic (congestive) heart failure: Secondary | ICD-10-CM | POA: Diagnosis not present

## 2019-07-31 DIAGNOSIS — J45909 Unspecified asthma, uncomplicated: Secondary | ICD-10-CM | POA: Diagnosis not present

## 2019-07-31 MED ORDER — BREO ELLIPTA 200-25 MCG/INH IN AEPB: 1.0000 | INHALATION_SPRAY | Freq: Every day | RESPIRATORY_TRACT | 6 refills | Status: DC

## 2019-07-31 MED ORDER — BREO ELLIPTA 100-25 MCG/INH IN AEPB: 1.0000 | INHALATION_SPRAY | Freq: Every day | RESPIRATORY_TRACT | 1 refills | Status: DC

## 2019-07-31 NOTE — Telephone Encounter (Signed)
Ran test claims for 1 month supply: Deductible has been met.  Breo- $47.00  Symbicort- $47.00  Dulera- non-formulary

## 2019-07-31 NOTE — Progress Notes (Signed)
_0  ID: [name], female    DOB: [birth year], 86 y.o.   MRN: [id number]  Chief Complaint  Patient presents with  . Follow-up    Sinusitis, lasix is making her weak and dizzy    Referring provider: [name]  HPI: 86 year old female, former smoker quit 1975 (10-pack-year history).  Past medical history significant for asthma, community-acquired pneumonia, pleural effusion, chronic respiratory failure with hypoxia, chronic diastolic heart failure, hypertension, non-ST elevated MI, proximal A. fib, pulmonary hypertension, GERD, chronic kidney disease stage III.  Patient of Dr. Halford Chessman, last seen by pulmonary nurse practitioner on 07/11/2019.  Patient is prescribed Wixela but unable to afford.  Previous [facility] pulmonary encounters: 07/11/2019 Hospital Follow Up: Pt. Presents for follow up. She was most recently seen in the [facility] 07/07/2019 as noted above for dyspnea that did not improve with prednisone. She was treated with lasix 20 mg x 5 days, and daily weights for trending. She has been eating bananas  To replete her potassium.  Per her granddaughter she has dropped 7-8 pounds with the additional lasix over the last 5 days. The last day of lasix was today 6/2.They cough has improved. She does still have a cough, but it is much better than prior to the lasix. . She is coughing up yellow secretions. She is asking for tussinex for her cough, which I think may be related to her heart failure. She states she does have some sinus congestion x 6 months. She has clear to white secretions from her nares.She has been using Flonase for this as needed.She has minimal edema in her ankles. She is in NAD today , with oxygen saturations of 96% on RA.  07/31/2019 Patient presents today for 2-week follow-up after starting Breo and resuming Singulair for asthma. BMET with obtained which showed stable chronic kidney disease, creatinine was 1.42. During last visit with pulmonary  practitioner, she was given additional Lasix 20 mg to use as needed for weight gain of 3 pounds or more and was recommended to follow-up with cardiology on June 16th.  She was able to see Dr. Meda Coffee with cardiology on June 9 for acute on chronic diastolic heart failure.  She was started on Lasix 40 mg daily.  Directed to follow back up with cardiology in 2 weeks if she continues to have symptoms they will consider ordering Lexiscan nuclear stress test.  She is not a great candidate for anticoagulation or proximal A. fib.  Pacemaker last checked March 23, 2019, followed by Dr. Lovena Le.  She is doing ok. Lasix is causing fatigue, weakness and dizziness. Bp at home has been stable. She has been compliant with Breo and feels it is helping. Her coughing fits and congestion have improved. She has a commode beside the bed. She had a UTI recently, finished abx.   Orthostatic BP 07/31/2019: BP sitting- 116/58-60 BP standing - 118/58-56  Pulmonary tests: V/Q scan 05/16/17 >> low probability for PE Spirometry 06/03/17 >> FEV1 1.23 (78%), FEV1% 86 Lt thoracentesis 06/13/17 >> 600 ml fluid, glucose 101, LDH 273, protein 3.6, WBC 4226 (73% N), cytology negative, culture negative  Imaging: CT chest 06/06/08 >> BTX lower lobes HRCT chest 06/03/17 >> loculated Lt effusion, small Rt effusion, subpleural reticulation and traction BTX  CT chest 04/2019>> scattered fine subpleural reticular densities in both lungs, suspect degree of underlying fibrosis similar to previous.  Cardiomegaly with three-vessel coronary artery calcification and aortic arthrosclerosis.  Dilated main pulmonary artery suggesting allowing underlying pulmonary hypertension  Chest x-ray 06/28/19-mild bibasilar subsegmental atelectasis and/or scarring.  Mild but basilar infiltrates cannot be excluded Chest x-ray 07/09/2019-cardiomegaly with unchanged mild interstitial prominence and bibasilar atelectasis  Cardiac tests: Echo 03/08/17 >> EF 55 to 60%,  grade 1 DD V/Q scan 05/17/17 >> no DVT Echo 06/28/2019>> 65 to 95%, grade 1 diastolic dysfunction   Allergies  Allergen Reactions  . Amoxicillin Other (See Comments)    Very weak Has patient had a PCN reaction causing immediate rash, facial/tongue/throat swelling, SOB or lightheadedness with hypotension: no Has patient had a PCN reaction causing severe rash involving mucus membranes or skin necrosis: no Has patient had a PCN reaction that required hospitalization: no Has patient had a PCN reaction occurring within the last 10 years: no If all of the above answers are "NO", then may proceed with Cephalosporin use.   . Diltiazem   . Diltiazem Hcl Other (See Comments)    Reaction unknown  . Losartan Potassium Other (See Comments)    Potassium level increased drastically  . Potassium-Containing Compounds Other (See Comments)    Pt is very sensitive to potassium products. Her Potassium rises very quickly and takes a long time to come down.  . Sulfonamide Derivatives Other (See Comments)    Reaction unknown  . Zetia [Ezetimibe] Other (See Comments)    Weakness  . Penicillins Rash    DID THE REACTION INVOLVE: Swelling of the face/tongue/throat, SOB, or low BP? Unknown Sudden or severe rash/hives, skin peeling, or the inside of the mouth or nose? Unknown Did it require medical treatment? Unknown When did it last happen?unknown If all above answers are "NO", may proceed with cephalosporin use.     Immunization History  Administered Date(s) Administered  . Influenza, High Dose Seasonal PF 11/27/2016, 10/24/2017, 11/09/2018  . Moderna SARS-COVID-2 Vaccination 04/17/2019, 05/08/2019  . Pneumococcal Polysaccharide-23 09/08/2016  . Tdap 02/08/2018    Past Medical History:  Diagnosis Date  . Anemia    "I stay anemic"  . Arthritis   . Asthma    no problems recent  . Cataract   . Chronic diastolic CHF (congestive heart failure), NYHA class 2 ([place]) 03/19/2014   Echo 4/19: mod  LVH, EF 55-60, no RWMA, Gr 1 DD, MAC, trivial MR, mod LAE, normal RVSF, PASP 19  . CKD (chronic kidney disease), stage III   . Complication of anesthesia    CONFUSED AFTER KNEE SURGERY  . Depression   . Diverticulitis   . DVT (deep venous thrombosis) ([place])    from birth control, left groin  . Essential hypertension   . Fibromyalgia 10 y.a.  . GERD (gastroesophageal reflux disease)   . Gout   . Hx of cardiovascular stress test    Lexiscan Myoview (8/15): apical attenuation artifact, no ischemia, EF 69% - low risk.  Marland Kitchen Hypercholesteremia   . Odynophagia   . Other abnormal glucose   . PAF (paroxysmal atrial fibrillation) ([place])   . Personal history of other diseases of digestive system   . Pneumonia    hx of  . Presence of permanent cardiac pacemaker   . Sinus bradycardia   . Skin cancer 2014   skin R elbow & face  . Symptomatic bradycardia 06/2016    Tobacco History: Social History   Tobacco Use  Smoking Status Former Smoker  . Packs/day: 1.00  . Years: 10.00  . Pack years: 10.00  . Types: Cigarettes  . Quit date: 02/08/1973  . Years since quitting: 46.5  Smokeless Tobacco Never Used  Counseling given: Not Answered   Outpatient Medications Prior to Visit  Medication Sig Dispense Refill  . acetaminophen (TYLENOL) 500 MG tablet Take 2 tablets (1,000 mg total) by mouth every 8 (eight) hours as needed for mild pain or headache.    . albuterol (2.5 MG/3ML) 0.083% NEBU 3 mL, albuterol (5 MG/ML) 0.5% NEBU 0.5 mL Inhale into the lungs.    Marland Kitchen albuterol (PROVENTIL HFA;VENTOLIN HFA) 108 (90 BASE) MCG/ACT inhaler Inhale 1 puff into the lungs every 6 (six) hours as needed for wheezing or shortness of breath. 1 Inhaler 3  . alendronate (FOSAMAX) 70 MG tablet Take 70 mg by mouth once a week. On Friday    . allopurinol (ZYLOPRIM) 100 MG tablet Take 1 tablet (100 mg total) by mouth daily. 30 tablet 6  . amLODipine (NORVASC) 2.5 MG tablet Take 1 tablet (2.5 mg total) by mouth daily. Please  make overdue appt with Dr. Meda Coffee before anymore refills. 1st attempt 30 tablet 0  . aspirin EC 81 MG tablet Take 81 mg by mouth daily.     . cholecalciferol (VITAMIN D) 25 MCG (1000 UNIT) tablet Take 1,000 Units by mouth daily.    . famotidine (PEPCID) 20 MG tablet Take 20 mg by mouth 2 (two) times daily.    . ferrous sulfate 325 (65 FE) MG tablet Take 325 mg by mouth daily.     . furosemide (LASIX) 40 MG tablet Take 1 tablet (40 mg total) by mouth daily. 30 tablet 2  . methocarbamol (ROBAXIN) 500 MG tablet Take 500 mg by mouth 2 (two) times daily as needed for muscle spasms.    . montelukast (SINGULAIR) 10 MG tablet Take 1 tablet (10 mg total) by mouth at bedtime. 30 tablet 11  . ondansetron (ZOFRAN-ODT) 8 MG disintegrating tablet Take 8 mg by mouth every 8 (eight) hours as needed for nausea or vomiting.     . rosuvastatin (CRESTOR) 10 MG tablet Take 1 tablet (10 mg total) by mouth daily. 30 tablet 11  . sertraline (ZOLOFT) 50 MG tablet Take 50 mg by mouth daily.     . fluticasone furoate-vilanterol (BREO ELLIPTA) 200-25 MCG/INH AEPB Inhale 1 puff into the lungs daily. 14 each 0   No facility-administered medications prior to visit.    Review of Systems  Review of Systems  Respiratory: Negative for cough, shortness of breath and wheezing.   Cardiovascular: Positive for leg swelling.  Neurological: Positive for dizziness, weakness and light-headedness.   Physical Exam  BP 112/68 (BP Location: Left Arm, Cuff Size: Normal)   Pulse 74   Temp (!) 97.3 F (36.3 C) (Oral)   Ht _0  (1.6 m)   Wt 155 lb 9.6 oz (70.6 kg)   SpO2 96%   BMI 27.56 kg/m  Physical Exam Constitutional:      Appearance: Normal appearance.  HENT:     Head: Normocephalic and atraumatic.  Cardiovascular:     Rate and Rhythm: Normal rate.     Comments: +1 BLE edema, appears improved  Pulmonary:     Effort: Pulmonary effort is normal.     Breath sounds: Normal breath sounds.  Skin:    General: Skin is warm  and dry.  Neurological:     General: No focal deficit present.     Mental Status: She is alert and oriented to person, place, and time. Mental status is at baseline.  Psychiatric:        Mood and Affect: Mood normal.  Behavior: Behavior normal.        Thought Content: Thought content normal.        Judgment: Judgment normal.      Lab Results:  CBC    Component Value Date/Time   WBC 6.9 06/28/2019 0338   RBC 2.87 (L) 06/28/2019 0338   RBC 2.95 (L) 06/28/2019 0338   HGB 9.6 (L) 06/28/2019 0338   HGB 11.5 10/10/2018 1422   HCT 29.4 (L) 06/28/2019 0338   HCT 35.2 10/10/2018 1422   PLT 125 (L) 06/28/2019 0338   PLT 176 10/10/2018 1422   MCV 99.7 06/28/2019 0338   MCV 103 (H) 10/10/2018 1422   MCH 32.5 06/28/2019 0338   MCHC 32.7 06/28/2019 0338   RDW 14.0 06/28/2019 0338   RDW 13.5 10/10/2018 1422   LYMPHSABS 0.2 (L) 06/27/2019 0410   MONOABS 0.1 06/27/2019 0410   EOSABS 0.0 06/27/2019 0410   BASOSABS 0.0 06/27/2019 0410    BMET    Component Value Date/Time   NA 137 07/11/2019 1508   NA 142 10/27/2018 1348   K 4.5 07/11/2019 1508   CL 99 07/11/2019 1508   CO2 30 07/11/2019 1508   GLUCOSE 115 (H) 07/11/2019 1508   BUN 37 (H) 07/11/2019 1508   BUN 25 10/27/2018 1348   CREATININE 1.42 (H) 07/11/2019 1508   CALCIUM 9.6 07/11/2019 1508   GFRNONAA 32 (L) 06/28/2019 0338   GFRAA 37 (L) 06/28/2019 0338    BNP    Component Value Date/Time   BNP 356.9 (H) 06/26/2019 2335    ProBNP    Component Value Date/Time   PROBNP 1,456 (H) 07/06/2017 1000   PROBNP 113.0 (H) 08/24/2013 0940    Imaging: No results found.   Assessment & Plan:   Asthma - Stable interval; cough and congestion have improved - Continue Breo 200 one puff daily - sample given today (we will do a benefits investigation to see which inhaler is covered by her insurance) - Continue montelukast 22m at bedtime  Chronic diastolic CHF (congestive heart failure), NYHA class 2 ([place] - Patient  reports diuretic is causing some dizziness. Orthostatic BP was normal today in the office. Recommend patient try taking lasix 260mtwice daily (instead of 4082mnce in the morning) - Follow up with Cardiology in 1 week, recommend labs at that visit or with PCP this Friday    -FU 6-8 weeks with Dr. SooHalford Chessman SarReece AgarP 08/13/2019

## 2019-07-31 NOTE — Telephone Encounter (Signed)
Thank you :)

## 2019-07-31 NOTE — Patient Instructions (Addendum)
Heart failure: - Orthostatic BP was normal - Encourae you to change position slowly and use walker - Try taking lasix 20mg  twice a day- instead of 40mg  once daily (9am and 2pm)  Asthma: - Continue Breo 200 one puff daily - sample given today (we will do a benefits investigation to see which inhaler is covered by your insurance and sent in prescription for that to your pharmacy)  Follow-up: - 6-8 weeks with Dr. Halford Chessman or Judson Roch  - Follow up with Cardiology in 1 week, recommend labs at that visit or with PCP this Friday

## 2019-07-31 NOTE — Telephone Encounter (Signed)
Please do benefits investigation for ICS/LABA- BREO 200, SYMBICORT 160 OR DULERA 200

## 2019-08-02 DIAGNOSIS — J45909 Unspecified asthma, uncomplicated: Secondary | ICD-10-CM | POA: Diagnosis not present

## 2019-08-02 DIAGNOSIS — N189 Chronic kidney disease, unspecified: Secondary | ICD-10-CM | POA: Diagnosis not present

## 2019-08-02 DIAGNOSIS — M1990 Unspecified osteoarthritis, unspecified site: Secondary | ICD-10-CM | POA: Diagnosis not present

## 2019-08-02 DIAGNOSIS — D631 Anemia in chronic kidney disease: Secondary | ICD-10-CM | POA: Diagnosis not present

## 2019-08-02 DIAGNOSIS — I130 Hypertensive heart and chronic kidney disease with heart failure and stage 1 through stage 4 chronic kidney disease, or unspecified chronic kidney disease: Secondary | ICD-10-CM | POA: Diagnosis not present

## 2019-08-02 DIAGNOSIS — I5032 Chronic diastolic (congestive) heart failure: Secondary | ICD-10-CM | POA: Diagnosis not present

## 2019-08-02 DIAGNOSIS — I272 Pulmonary hypertension, unspecified: Secondary | ICD-10-CM | POA: Diagnosis not present

## 2019-08-02 DIAGNOSIS — I480 Paroxysmal atrial fibrillation: Secondary | ICD-10-CM | POA: Diagnosis not present

## 2019-08-02 DIAGNOSIS — I214 Non-ST elevation (NSTEMI) myocardial infarction: Secondary | ICD-10-CM | POA: Diagnosis not present

## 2019-08-02 DIAGNOSIS — M109 Gout, unspecified: Secondary | ICD-10-CM | POA: Diagnosis not present

## 2019-08-03 DIAGNOSIS — E782 Mixed hyperlipidemia: Secondary | ICD-10-CM | POA: Diagnosis not present

## 2019-08-08 DIAGNOSIS — J411 Mucopurulent chronic bronchitis: Secondary | ICD-10-CM | POA: Diagnosis not present

## 2019-08-08 DIAGNOSIS — I5032 Chronic diastolic (congestive) heart failure: Secondary | ICD-10-CM | POA: Diagnosis not present

## 2019-08-08 DIAGNOSIS — M109 Gout, unspecified: Secondary | ICD-10-CM | POA: Diagnosis not present

## 2019-08-08 DIAGNOSIS — I272 Pulmonary hypertension, unspecified: Secondary | ICD-10-CM | POA: Diagnosis not present

## 2019-08-08 DIAGNOSIS — E782 Mixed hyperlipidemia: Secondary | ICD-10-CM | POA: Diagnosis not present

## 2019-08-08 DIAGNOSIS — M1990 Unspecified osteoarthritis, unspecified site: Secondary | ICD-10-CM | POA: Diagnosis not present

## 2019-08-08 DIAGNOSIS — R69 Illness, unspecified: Secondary | ICD-10-CM | POA: Diagnosis not present

## 2019-08-08 DIAGNOSIS — J45909 Unspecified asthma, uncomplicated: Secondary | ICD-10-CM | POA: Diagnosis not present

## 2019-08-08 DIAGNOSIS — D649 Anemia, unspecified: Secondary | ICD-10-CM | POA: Diagnosis not present

## 2019-08-08 DIAGNOSIS — I130 Hypertensive heart and chronic kidney disease with heart failure and stage 1 through stage 4 chronic kidney disease, or unspecified chronic kidney disease: Secondary | ICD-10-CM | POA: Diagnosis not present

## 2019-08-08 DIAGNOSIS — D631 Anemia in chronic kidney disease: Secondary | ICD-10-CM | POA: Diagnosis not present

## 2019-08-08 DIAGNOSIS — N189 Chronic kidney disease, unspecified: Secondary | ICD-10-CM | POA: Diagnosis not present

## 2019-08-08 DIAGNOSIS — I214 Non-ST elevation (NSTEMI) myocardial infarction: Secondary | ICD-10-CM | POA: Diagnosis not present

## 2019-08-08 DIAGNOSIS — I10 Essential (primary) hypertension: Secondary | ICD-10-CM | POA: Diagnosis not present

## 2019-08-08 DIAGNOSIS — I129 Hypertensive chronic kidney disease with stage 1 through stage 4 chronic kidney disease, or unspecified chronic kidney disease: Secondary | ICD-10-CM | POA: Diagnosis not present

## 2019-08-08 DIAGNOSIS — I480 Paroxysmal atrial fibrillation: Secondary | ICD-10-CM | POA: Diagnosis not present

## 2019-08-08 DIAGNOSIS — M810 Age-related osteoporosis without current pathological fracture: Secondary | ICD-10-CM | POA: Diagnosis not present

## 2019-08-08 DIAGNOSIS — I4891 Unspecified atrial fibrillation: Secondary | ICD-10-CM | POA: Diagnosis not present

## 2019-08-13 NOTE — Assessment & Plan Note (Addendum)
-   Patient reports diuretic is causing some dizziness. Orthostatic BP was normal today in the office. Recommend patient try taking lasix 20mg  twice daily (instead of 40mg  once in the morning) - Follow up with Cardiology in 1 week, recommend labs at that visit or with PCP this Friday

## 2019-08-13 NOTE — Assessment & Plan Note (Addendum)
-   Stable interval; cough and congestion have improved - Continue Breo 200 one puff daily - sample given today (we will do a benefits investigation to see which inhaler is covered by her insurance) - Continue montelukast 10mg  at bedtime

## 2019-08-14 DIAGNOSIS — I214 Non-ST elevation (NSTEMI) myocardial infarction: Secondary | ICD-10-CM | POA: Diagnosis not present

## 2019-08-14 DIAGNOSIS — M1990 Unspecified osteoarthritis, unspecified site: Secondary | ICD-10-CM | POA: Diagnosis not present

## 2019-08-14 DIAGNOSIS — N189 Chronic kidney disease, unspecified: Secondary | ICD-10-CM | POA: Diagnosis not present

## 2019-08-14 DIAGNOSIS — I480 Paroxysmal atrial fibrillation: Secondary | ICD-10-CM | POA: Diagnosis not present

## 2019-08-14 DIAGNOSIS — D631 Anemia in chronic kidney disease: Secondary | ICD-10-CM | POA: Diagnosis not present

## 2019-08-14 DIAGNOSIS — I272 Pulmonary hypertension, unspecified: Secondary | ICD-10-CM | POA: Diagnosis not present

## 2019-08-14 DIAGNOSIS — I130 Hypertensive heart and chronic kidney disease with heart failure and stage 1 through stage 4 chronic kidney disease, or unspecified chronic kidney disease: Secondary | ICD-10-CM | POA: Diagnosis not present

## 2019-08-14 DIAGNOSIS — M109 Gout, unspecified: Secondary | ICD-10-CM | POA: Diagnosis not present

## 2019-08-14 DIAGNOSIS — I5032 Chronic diastolic (congestive) heart failure: Secondary | ICD-10-CM | POA: Diagnosis not present

## 2019-08-14 DIAGNOSIS — J45909 Unspecified asthma, uncomplicated: Secondary | ICD-10-CM | POA: Diagnosis not present

## 2019-08-16 ENCOUNTER — Telehealth: Payer: Self-pay | Admitting: Cardiology

## 2019-08-16 DIAGNOSIS — R296 Repeated falls: Secondary | ICD-10-CM | POA: Diagnosis not present

## 2019-08-16 MED ORDER — FUROSEMIDE 40 MG PO TABS: 40.0000 mg | ORAL_TABLET | Freq: Every day | ORAL | 11 refills | Status: DC

## 2019-08-16 NOTE — Telephone Encounter (Signed)
*  STAT* If patient is at the pharmacy, call can be transferred to refill team.   1. Which medications need to be refilled? (please list name of each medication and dose if known) furosemide (LASIX) 40 MG tablet  2. Which pharmacy/location (including street and city if local pharmacy) is medication to be sent to? [facility] - [place], [place] - [address]  3. Do they need a 30 day or 90 day supply? 90 day     

## 2019-08-16 NOTE — Telephone Encounter (Signed)
Pt's medication was sent to pt's pharmacy as requested. Confirmation received.  °

## 2019-08-20 ENCOUNTER — Telehealth: Payer: Self-pay | Admitting: Cardiology

## 2019-08-20 ENCOUNTER — Telehealth: Payer: Self-pay | Admitting: Primary Care

## 2019-08-20 ENCOUNTER — Telehealth: Payer: Self-pay

## 2019-08-20 DIAGNOSIS — Z5181 Encounter for therapeutic drug level monitoring: Secondary | ICD-10-CM | POA: Diagnosis not present

## 2019-08-20 DIAGNOSIS — Z79899 Other long term (current) drug therapy: Secondary | ICD-10-CM | POA: Diagnosis not present

## 2019-08-20 NOTE — Telephone Encounter (Signed)
[facility] [phone]

## 2019-08-20 NOTE — Telephone Encounter (Signed)
Kennitra from [facility] returning a call from [place].

## 2019-08-20 NOTE — Telephone Encounter (Signed)
Encounter previously opened on this pt and in regards to Sharon calling back. Will close this encounter and refer to previous open note, for flow of documentation.

## 2019-08-20 NOTE — Telephone Encounter (Signed)
[name] was calling back to speak with me, and was disconnected during transfer process. Will await her return call back.

## 2019-08-20 NOTE — Telephone Encounter (Signed)
[name] from [facility] called today asking for clarification on FUROSEMIDE. In pt chart it states she is taking 40MG  by mouth daily, pt is currently taking half a tablet twice a day so 20MG  by mouth twice daily. Her preferred pharmacy is [facility]. Per [name]'s request can you reach out to her to verify that Dr. Meda Coffee is ok with her taking 20MG  by mouth twice daily. Thank you

## 2019-08-20 NOTE — Telephone Encounter (Signed)
Kennitra from [facility] was calling to inform us that the pts lasix dosing has changed and wanted [place] to update this in our system. Candelaria Stagers states that pts Pulmonologist changed the way she is taking her lasix from lasix 40 mg po daily, as we prescribed, to lasix 20 mg po bid instead, for the pt reported she can tolerate taking lasix better this way, instead of all in one setting.  Lajean Saver for the update and informed her that I will update our records on this.  Kennitra verbalized understanding and agrees with this plan.

## 2019-08-20 NOTE — Telephone Encounter (Signed)
Assessment & Plan:   Asthma - Stable interval; cough and congestion have improved - Continue Breo 200 one puff daily - sample given today (we will do a benefits investigation to see which inhaler is covered by her insurance) - Continue montelukast 10mg  at bedtime  Chronic diastolic CHF (congestive heart failure), NYHA class 2 ([place]) - Patient reports diuretic is causing some dizziness. Orthostatic BP was normal today in the office. Recommend patient try taking lasix 20mg  twice daily (instead of 40mg  once in the morning) - Follow up with Cardiology in 1 week, recommend labs at that visit or with PCP this Friday    -FU 6-8 weeks with Dr. Halford Chessman or Reece Agar, NP 08/13/2019  Called and spoke with Candelaria Stagers from [facility] about pt's furosemide. Stated to her that we were not the ones that had prescribed the Rx and she stated that it was cardiology that had prescribed this for her. She wanted to know if we could send an Rx to pharmacy for pt with the updated recommended instructions by [facility] and I stated to her that since med has been prescribed by cardiology, this needs to be handled by them. Candelaria Stagers stated that she did call cardiology and they still had pt on 40mg  daily. Stated to [place] that I would send this info with the Assessment and Plan to Dr. Meda Coffee, pt's cardiologist so she can see recommendations given by beth at pt's last OV and [place] verbalized understanding.  Info routed to Dr. Meda Coffee.

## 2019-08-20 NOTE — Telephone Encounter (Signed)
Left Kennitra with [facility] at [facility] and [facility], to call the office back in regards to pts lasix dosing.

## 2019-08-21 MED ORDER — FUROSEMIDE 20 MG PO TABS: 20.0000 mg | ORAL_TABLET | Freq: Two times a day (BID) | ORAL | 2 refills | Status: DC

## 2019-08-21 NOTE — Telephone Encounter (Signed)
[name] from [facility] is calling requesting a new Rx for Lasix 20 mg taking 1 tablet bid, be sent into [facility].  She had called previously, and I see the med change in the computer, but since Dr. Meda Coffee didn't make the recommended change, will fwd to Winifred Olive, LPN to process.  Pt gets pill packs and Kennitr from [facility] helps coordinate her medications and they want a new prescription to show the med change so pt want get confused.

## 2019-08-21 NOTE — Telephone Encounter (Signed)
Refill for pts lasix 20 mg po bid sent to confirmed pharmacy of choice. Initially Dr. Meda Coffee had her on 40 mg po daily, and Pulmonology advised her to break this up to 20 mg po bid, but will not refill the med for the pt. They advised refills must come from Dr. Meda Coffee.  Pt tolerates lasix better if split to 20 mg po bid, vs in one setting. For continuity of care, refill for lasix 20 mg po bid was sent to her pharmacy.

## 2019-08-21 NOTE — Addendum Note (Signed)
Addended by: Nuala Alpha on: 08/21/2019 11:15 AM   Modules accepted: Orders

## 2019-08-29 DIAGNOSIS — I5030 Unspecified diastolic (congestive) heart failure: Secondary | ICD-10-CM | POA: Diagnosis not present

## 2019-08-29 DIAGNOSIS — Z950 Presence of cardiac pacemaker: Secondary | ICD-10-CM | POA: Diagnosis not present

## 2019-08-29 DIAGNOSIS — I480 Paroxysmal atrial fibrillation: Secondary | ICD-10-CM | POA: Diagnosis not present

## 2019-08-29 DIAGNOSIS — N390 Urinary tract infection, site not specified: Secondary | ICD-10-CM | POA: Diagnosis not present

## 2019-08-29 DIAGNOSIS — N1831 Chronic kidney disease, stage 3a: Secondary | ICD-10-CM | POA: Diagnosis not present

## 2019-08-29 DIAGNOSIS — I129 Hypertensive chronic kidney disease with stage 1 through stage 4 chronic kidney disease, or unspecified chronic kidney disease: Secondary | ICD-10-CM | POA: Diagnosis not present

## 2019-08-29 DIAGNOSIS — D631 Anemia in chronic kidney disease: Secondary | ICD-10-CM | POA: Diagnosis not present

## 2019-09-04 DIAGNOSIS — H5213 Myopia, bilateral: Secondary | ICD-10-CM | POA: Diagnosis not present

## 2019-09-08 DIAGNOSIS — M810 Age-related osteoporosis without current pathological fracture: Secondary | ICD-10-CM | POA: Diagnosis not present

## 2019-09-08 DIAGNOSIS — M1990 Unspecified osteoarthritis, unspecified site: Secondary | ICD-10-CM | POA: Diagnosis not present

## 2019-09-08 DIAGNOSIS — I5032 Chronic diastolic (congestive) heart failure: Secondary | ICD-10-CM | POA: Diagnosis not present

## 2019-09-08 DIAGNOSIS — I4891 Unspecified atrial fibrillation: Secondary | ICD-10-CM | POA: Diagnosis not present

## 2019-09-08 DIAGNOSIS — D649 Anemia, unspecified: Secondary | ICD-10-CM | POA: Diagnosis not present

## 2019-09-08 DIAGNOSIS — I129 Hypertensive chronic kidney disease with stage 1 through stage 4 chronic kidney disease, or unspecified chronic kidney disease: Secondary | ICD-10-CM | POA: Diagnosis not present

## 2019-09-08 DIAGNOSIS — E782 Mixed hyperlipidemia: Secondary | ICD-10-CM | POA: Diagnosis not present

## 2019-09-08 DIAGNOSIS — I10 Essential (primary) hypertension: Secondary | ICD-10-CM | POA: Diagnosis not present

## 2019-09-08 DIAGNOSIS — R69 Illness, unspecified: Secondary | ICD-10-CM | POA: Diagnosis not present

## 2019-09-08 DIAGNOSIS — J411 Mucopurulent chronic bronchitis: Secondary | ICD-10-CM | POA: Diagnosis not present

## 2019-09-12 DIAGNOSIS — I5032 Chronic diastolic (congestive) heart failure: Secondary | ICD-10-CM | POA: Diagnosis not present

## 2019-09-12 DIAGNOSIS — M810 Age-related osteoporosis without current pathological fracture: Secondary | ICD-10-CM | POA: Diagnosis not present

## 2019-09-12 DIAGNOSIS — I129 Hypertensive chronic kidney disease with stage 1 through stage 4 chronic kidney disease, or unspecified chronic kidney disease: Secondary | ICD-10-CM | POA: Diagnosis not present

## 2019-09-12 DIAGNOSIS — I4891 Unspecified atrial fibrillation: Secondary | ICD-10-CM | POA: Diagnosis not present

## 2019-09-12 DIAGNOSIS — I10 Essential (primary) hypertension: Secondary | ICD-10-CM | POA: Diagnosis not present

## 2019-09-12 DIAGNOSIS — J411 Mucopurulent chronic bronchitis: Secondary | ICD-10-CM | POA: Diagnosis not present

## 2019-09-12 DIAGNOSIS — E782 Mixed hyperlipidemia: Secondary | ICD-10-CM | POA: Diagnosis not present

## 2019-09-12 DIAGNOSIS — R69 Illness, unspecified: Secondary | ICD-10-CM | POA: Diagnosis not present

## 2019-09-12 DIAGNOSIS — D649 Anemia, unspecified: Secondary | ICD-10-CM | POA: Diagnosis not present

## 2019-09-12 DIAGNOSIS — M1990 Unspecified osteoarthritis, unspecified site: Secondary | ICD-10-CM | POA: Diagnosis not present

## 2019-09-18 DIAGNOSIS — N189 Chronic kidney disease, unspecified: Secondary | ICD-10-CM | POA: Diagnosis not present

## 2019-09-18 DIAGNOSIS — N179 Acute kidney failure, unspecified: Secondary | ICD-10-CM | POA: Diagnosis not present

## 2019-09-18 DIAGNOSIS — D631 Anemia in chronic kidney disease: Secondary | ICD-10-CM | POA: Diagnosis not present

## 2019-09-18 DIAGNOSIS — N1831 Chronic kidney disease, stage 3a: Secondary | ICD-10-CM | POA: Diagnosis not present

## 2019-09-18 DIAGNOSIS — N2581 Secondary hyperparathyroidism of renal origin: Secondary | ICD-10-CM | POA: Diagnosis not present

## 2019-09-18 DIAGNOSIS — I5030 Unspecified diastolic (congestive) heart failure: Secondary | ICD-10-CM | POA: Diagnosis not present

## 2019-09-18 DIAGNOSIS — I129 Hypertensive chronic kidney disease with stage 1 through stage 4 chronic kidney disease, or unspecified chronic kidney disease: Secondary | ICD-10-CM | POA: Diagnosis not present

## 2019-09-20 DIAGNOSIS — R05 Cough: Secondary | ICD-10-CM | POA: Diagnosis not present

## 2019-09-25 ENCOUNTER — Other Ambulatory Visit (HOSPITAL_COMMUNITY): Payer: Self-pay | Admitting: *Deleted

## 2019-09-25 NOTE — Discharge Instructions (Signed)

## 2019-09-26 ENCOUNTER — Inpatient Hospital Stay (HOSPITAL_COMMUNITY)
Admission: RE | Admit: 2019-09-26 | Discharge: 2019-09-26 | Disposition: A | Discharge status: Home / Self Care | Payer: Medicare HMO | Source: Ambulatory Visit | Attending: Nephrology | Admitting: Nephrology

## 2019-09-26 ENCOUNTER — Encounter (HOSPITAL_COMMUNITY): Payer: Self-pay

## 2019-10-02 IMAGING — DX DG CHEST 2V
2 series · 2 of 2 positions shown · non-contrast
Comparison: 05/13/2017.  02/23/2017.

CLINICAL DATA: Shortness of breath.  Chest pain.  Productive cough.

EXAM:
CHEST - 2 VIEW

[dg chest 2 view (1 of 2)]
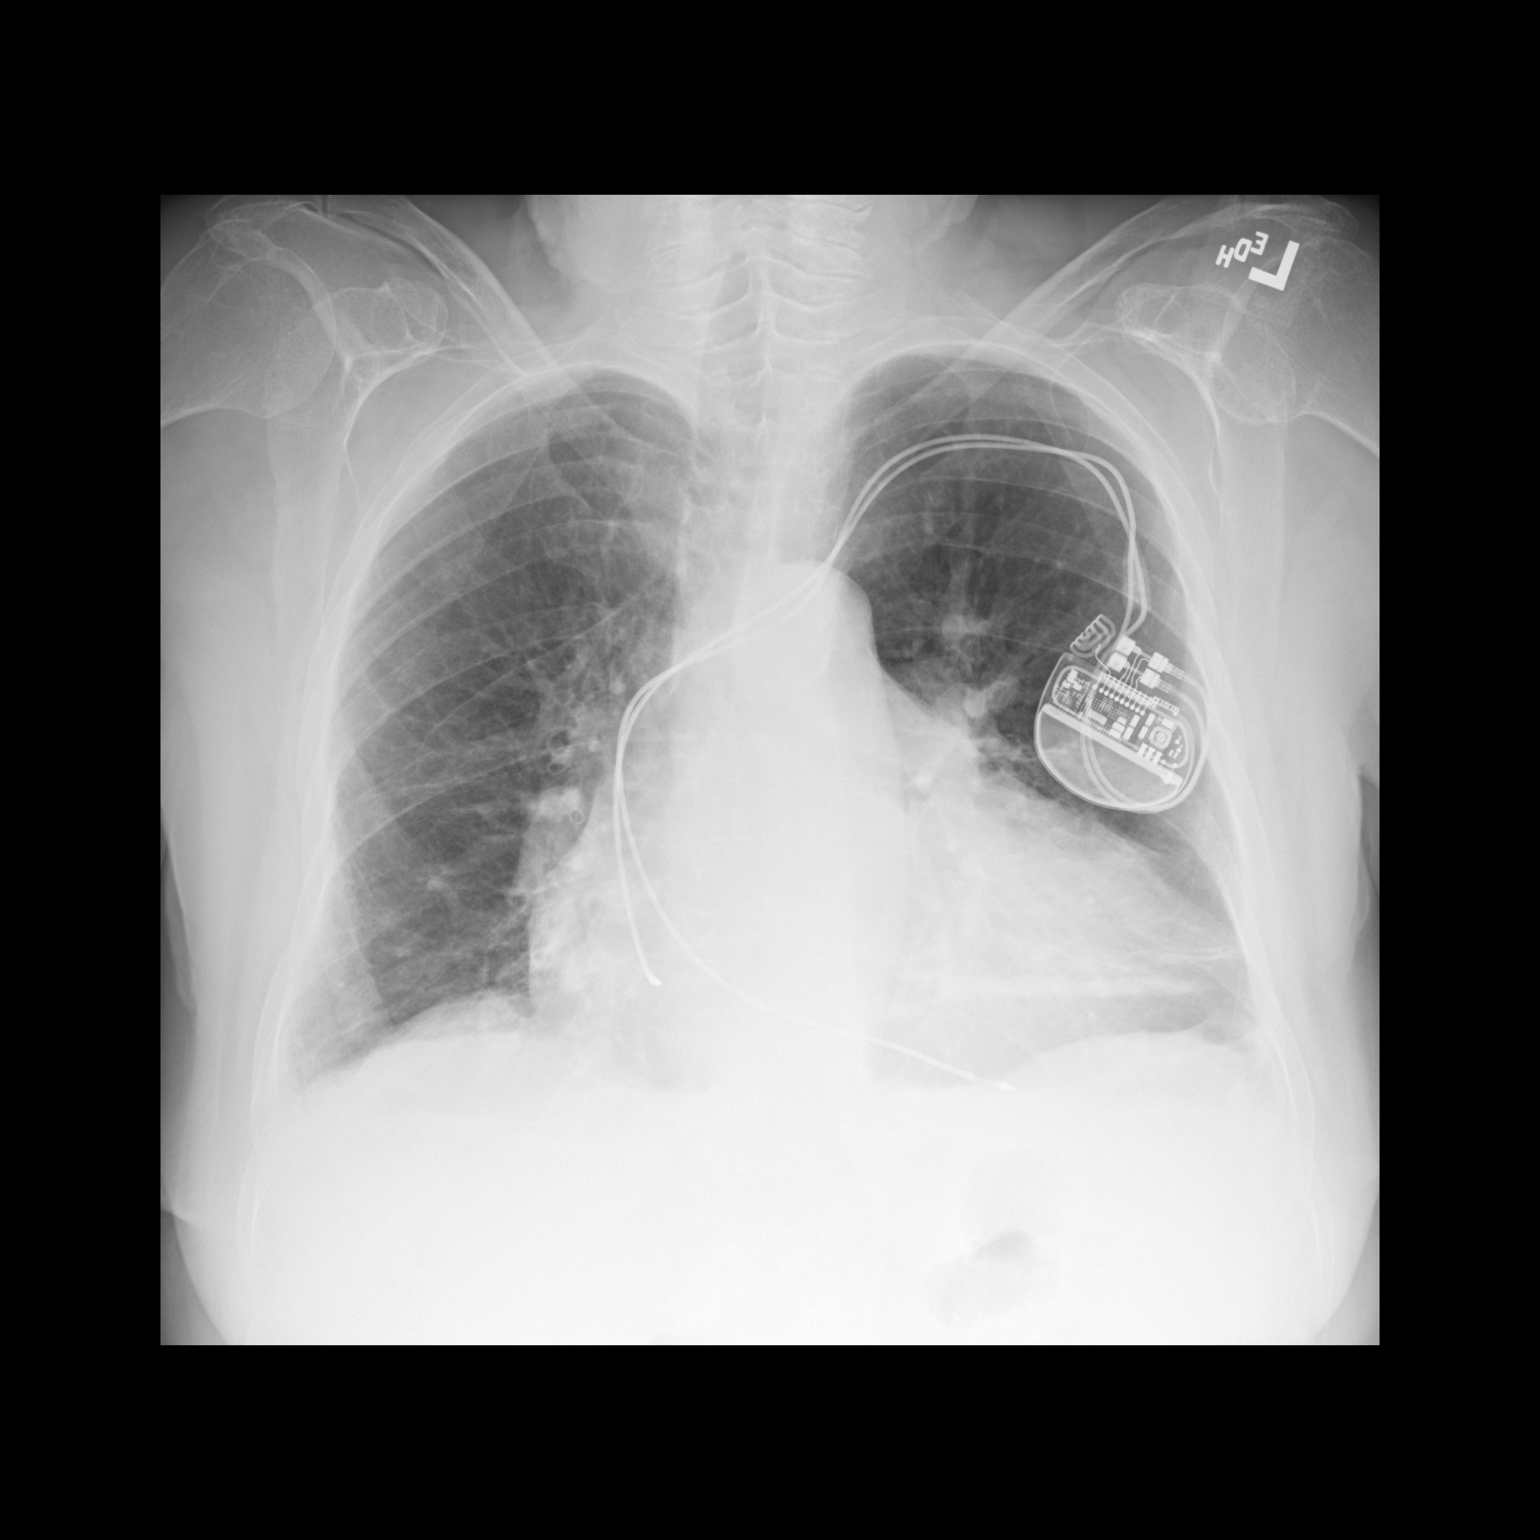

[dg chest 2 view (2 of 2)]
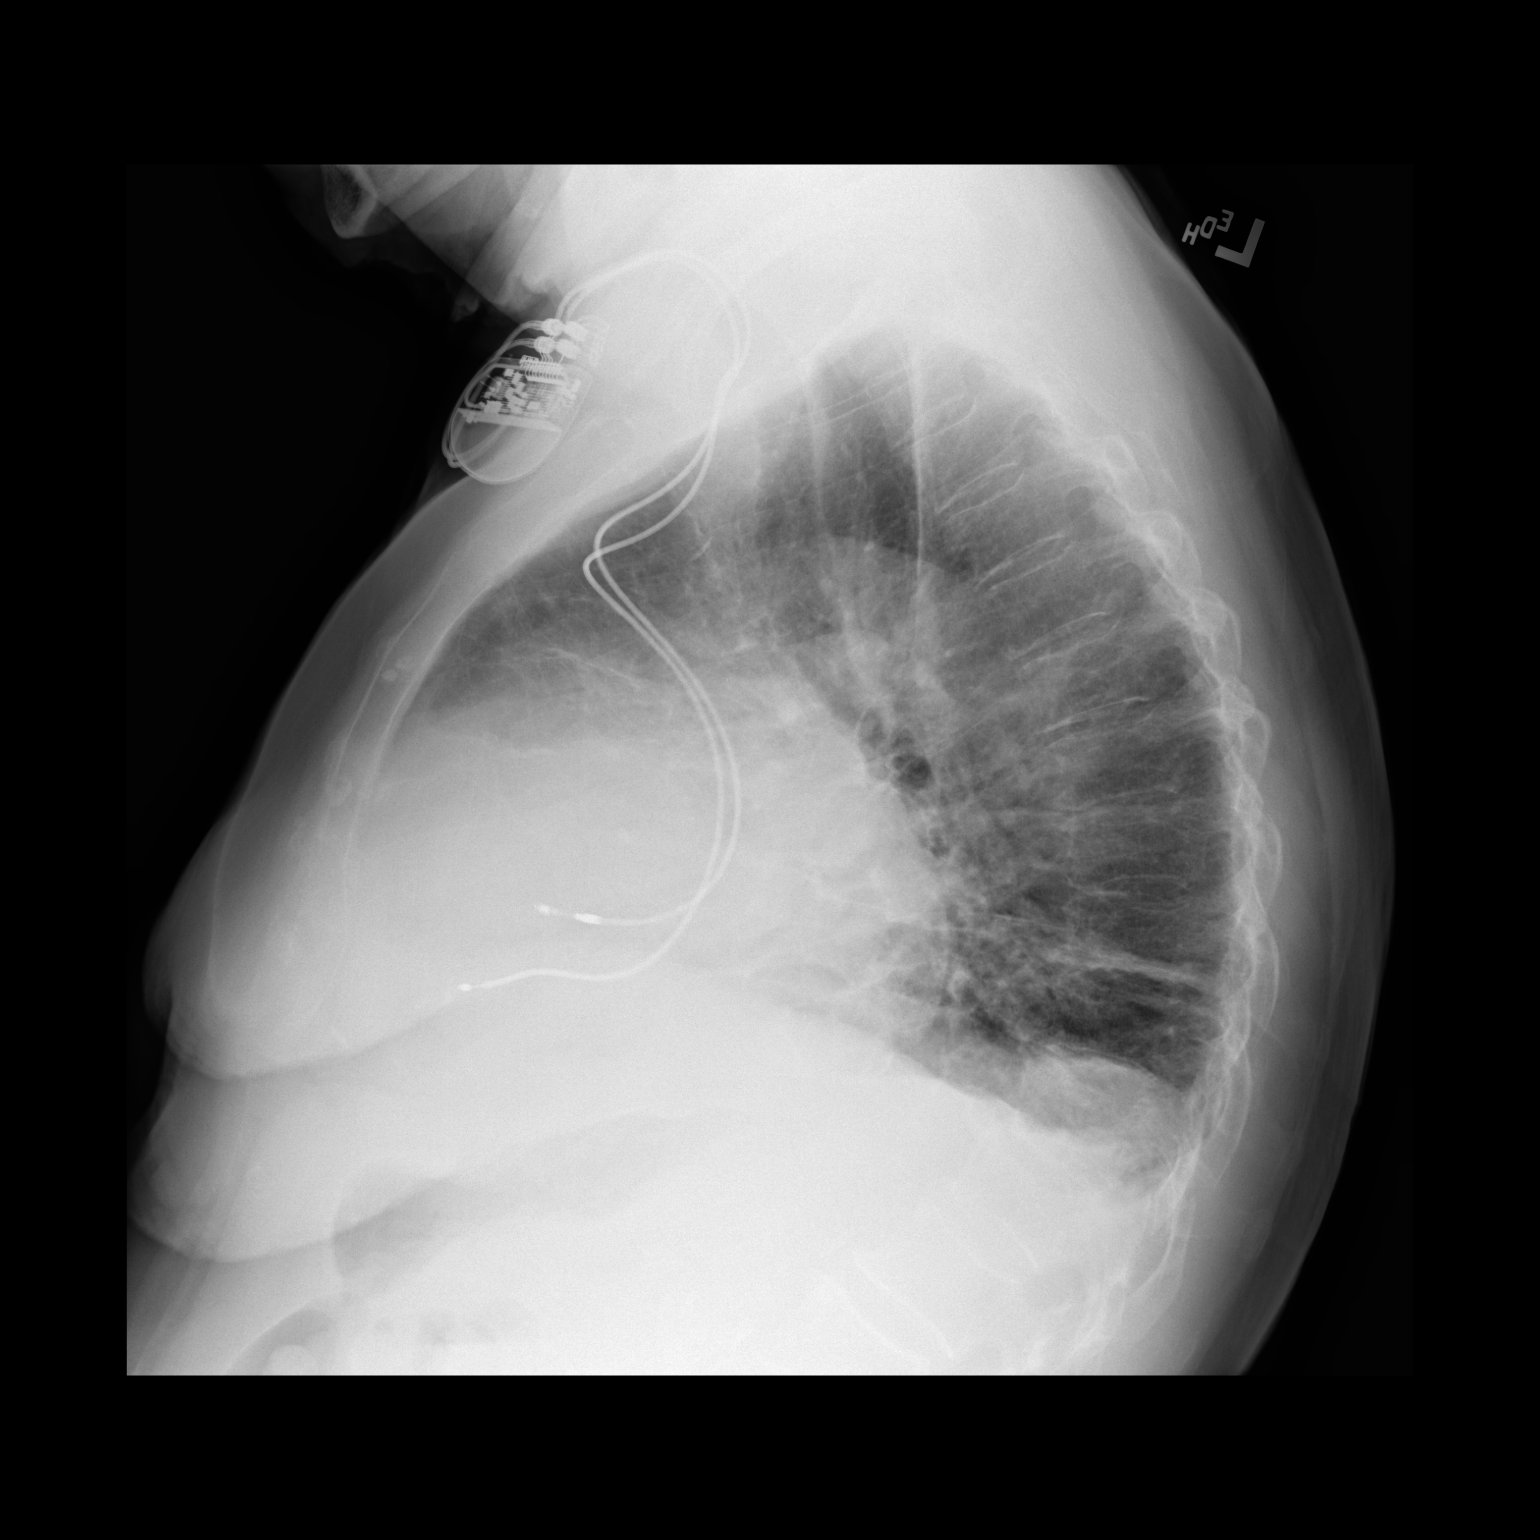

[2 of 2 positions shown; findings below may reference images not displayed]

FINDINGS: Cardiac pacer noted with lead tips over the right atrium right
ventricle. Cardiomegaly with normal pulmonary vascularity. Low lung
volumes with mild bibasilar atelectasis. Small bilateral pleural
effusions may be present on today's exam.. No pneumothorax
IMPRESSION: 1. Cardiac pacer with lead tips over the right atrium right
ventricle. Cardiomegaly with normal pulmonary vascularity.

2. Low lung volumes with mild bibasilar atelectasis. Similar
findings on prior exam. Small pleural effusions may be present on
today's exam.

## 2019-10-03 ENCOUNTER — Ambulatory Visit: Payer: Medicare HMO | Admitting: Pulmonary Disease

## 2019-10-03 ENCOUNTER — Encounter (HOSPITAL_COMMUNITY): Payer: Medicare HMO

## 2019-10-03 ENCOUNTER — Ambulatory Visit (INDEPENDENT_AMBULATORY_CARE_PROVIDER_SITE_OTHER): Payer: Medicare HMO | Admitting: *Deleted

## 2019-10-03 DIAGNOSIS — N390 Urinary tract infection, site not specified: Secondary | ICD-10-CM | POA: Diagnosis not present

## 2019-10-03 DIAGNOSIS — R001 Bradycardia, unspecified: Secondary | ICD-10-CM | POA: Diagnosis not present

## 2019-10-05 LAB — CUP PACEART REMOTE DEVICE CHECK
Battery Remaining Longevity: 84 mo
Battery Remaining Percentage: 100 %
Brady Statistic RA Percent Paced: 69 %
Brady Statistic RV Percent Paced: 0 %
Date Time Interrogation Session: 20210825034400
Implantable Lead Implant Date: 20180524
Implantable Lead Implant Date: 20180524
Implantable Lead Location: 753859
Implantable Lead Location: 753860
Implantable Lead Model: 7740
Implantable Lead Model: 7741
Implantable Lead Serial Number: 693708
Implantable Lead Serial Number: 861246
Implantable Pulse Generator Implant Date: 20180524
Lead Channel Impedance Value: 554 Ohm
Lead Channel Impedance Value: 702 Ohm
Lead Channel Pacing Threshold Amplitude: 0.4 V
Lead Channel Pacing Threshold Amplitude: 0.9 V
Lead Channel Pacing Threshold Pulse Width: 0.4 ms
Lead Channel Pacing Threshold Pulse Width: 0.4 ms
Lead Channel Setting Pacing Amplitude: 2 V
Lead Channel Setting Pacing Amplitude: 2.4 V
Lead Channel Setting Pacing Pulse Width: 0.4 ms
Lead Channel Setting Sensing Sensitivity: 2 mV
Pulse Gen Serial Number: 308044

## 2019-10-09 NOTE — Progress Notes (Signed)
Remote pacemaker transmission.   

## 2019-11-01 DIAGNOSIS — Z23 Encounter for immunization: Secondary | ICD-10-CM | POA: Diagnosis not present

## 2019-11-01 DIAGNOSIS — I5032 Chronic diastolic (congestive) heart failure: Secondary | ICD-10-CM | POA: Diagnosis not present

## 2019-11-01 DIAGNOSIS — J411 Mucopurulent chronic bronchitis: Secondary | ICD-10-CM | POA: Diagnosis not present

## 2019-11-01 DIAGNOSIS — I10 Essential (primary) hypertension: Secondary | ICD-10-CM | POA: Diagnosis not present

## 2019-11-01 DIAGNOSIS — I129 Hypertensive chronic kidney disease with stage 1 through stage 4 chronic kidney disease, or unspecified chronic kidney disease: Secondary | ICD-10-CM | POA: Diagnosis not present

## 2019-11-01 DIAGNOSIS — N390 Urinary tract infection, site not specified: Secondary | ICD-10-CM | POA: Diagnosis not present

## 2019-11-05 DIAGNOSIS — N390 Urinary tract infection, site not specified: Secondary | ICD-10-CM | POA: Diagnosis not present

## 2019-11-07 DIAGNOSIS — I4891 Unspecified atrial fibrillation: Secondary | ICD-10-CM | POA: Diagnosis not present

## 2019-11-07 DIAGNOSIS — I129 Hypertensive chronic kidney disease with stage 1 through stage 4 chronic kidney disease, or unspecified chronic kidney disease: Secondary | ICD-10-CM | POA: Diagnosis not present

## 2019-11-07 DIAGNOSIS — J411 Mucopurulent chronic bronchitis: Secondary | ICD-10-CM | POA: Diagnosis not present

## 2019-11-07 DIAGNOSIS — E782 Mixed hyperlipidemia: Secondary | ICD-10-CM | POA: Diagnosis not present

## 2019-11-07 DIAGNOSIS — M1990 Unspecified osteoarthritis, unspecified site: Secondary | ICD-10-CM | POA: Diagnosis not present

## 2019-11-07 DIAGNOSIS — M810 Age-related osteoporosis without current pathological fracture: Secondary | ICD-10-CM | POA: Diagnosis not present

## 2019-11-07 DIAGNOSIS — I10 Essential (primary) hypertension: Secondary | ICD-10-CM | POA: Diagnosis not present

## 2019-11-07 DIAGNOSIS — I5032 Chronic diastolic (congestive) heart failure: Secondary | ICD-10-CM | POA: Diagnosis not present

## 2019-11-07 DIAGNOSIS — R69 Illness, unspecified: Secondary | ICD-10-CM | POA: Diagnosis not present

## 2019-11-07 DIAGNOSIS — D649 Anemia, unspecified: Secondary | ICD-10-CM | POA: Diagnosis not present

## 2019-12-04 DIAGNOSIS — E782 Mixed hyperlipidemia: Secondary | ICD-10-CM | POA: Diagnosis not present

## 2019-12-04 DIAGNOSIS — I10 Essential (primary) hypertension: Secondary | ICD-10-CM | POA: Diagnosis not present

## 2019-12-04 DIAGNOSIS — J441 Chronic obstructive pulmonary disease with (acute) exacerbation: Secondary | ICD-10-CM | POA: Diagnosis not present

## 2019-12-04 DIAGNOSIS — M1990 Unspecified osteoarthritis, unspecified site: Secondary | ICD-10-CM | POA: Diagnosis not present

## 2019-12-04 DIAGNOSIS — R69 Illness, unspecified: Secondary | ICD-10-CM | POA: Diagnosis not present

## 2019-12-04 DIAGNOSIS — M810 Age-related osteoporosis without current pathological fracture: Secondary | ICD-10-CM | POA: Diagnosis not present

## 2019-12-04 DIAGNOSIS — I4891 Unspecified atrial fibrillation: Secondary | ICD-10-CM | POA: Diagnosis not present

## 2019-12-04 DIAGNOSIS — I129 Hypertensive chronic kidney disease with stage 1 through stage 4 chronic kidney disease, or unspecified chronic kidney disease: Secondary | ICD-10-CM | POA: Diagnosis not present

## 2019-12-04 DIAGNOSIS — I5032 Chronic diastolic (congestive) heart failure: Secondary | ICD-10-CM | POA: Diagnosis not present

## 2019-12-04 DIAGNOSIS — D649 Anemia, unspecified: Secondary | ICD-10-CM | POA: Diagnosis not present

## 2019-12-10 DIAGNOSIS — I129 Hypertensive chronic kidney disease with stage 1 through stage 4 chronic kidney disease, or unspecified chronic kidney disease: Secondary | ICD-10-CM | POA: Diagnosis not present

## 2019-12-10 DIAGNOSIS — I4891 Unspecified atrial fibrillation: Secondary | ICD-10-CM | POA: Diagnosis not present

## 2019-12-10 DIAGNOSIS — E782 Mixed hyperlipidemia: Secondary | ICD-10-CM | POA: Diagnosis not present

## 2019-12-10 DIAGNOSIS — D649 Anemia, unspecified: Secondary | ICD-10-CM | POA: Diagnosis not present

## 2019-12-10 DIAGNOSIS — I5032 Chronic diastolic (congestive) heart failure: Secondary | ICD-10-CM | POA: Diagnosis not present

## 2019-12-10 DIAGNOSIS — J411 Mucopurulent chronic bronchitis: Secondary | ICD-10-CM | POA: Diagnosis not present

## 2019-12-10 DIAGNOSIS — R69 Illness, unspecified: Secondary | ICD-10-CM | POA: Diagnosis not present

## 2019-12-10 DIAGNOSIS — J441 Chronic obstructive pulmonary disease with (acute) exacerbation: Secondary | ICD-10-CM | POA: Diagnosis not present

## 2019-12-10 DIAGNOSIS — I10 Essential (primary) hypertension: Secondary | ICD-10-CM | POA: Diagnosis not present

## 2019-12-10 DIAGNOSIS — M1990 Unspecified osteoarthritis, unspecified site: Secondary | ICD-10-CM | POA: Diagnosis not present

## 2020-01-02 ENCOUNTER — Ambulatory Visit (INDEPENDENT_AMBULATORY_CARE_PROVIDER_SITE_OTHER): Payer: Medicare HMO

## 2020-01-02 DIAGNOSIS — I495 Sick sinus syndrome: Secondary | ICD-10-CM

## 2020-01-02 DIAGNOSIS — R001 Bradycardia, unspecified: Secondary | ICD-10-CM | POA: Diagnosis not present

## 2020-01-03 LAB — CUP PACEART REMOTE DEVICE CHECK
Battery Remaining Longevity: 78 mo
Battery Remaining Percentage: 100 %
Brady Statistic RA Percent Paced: 71 %
Brady Statistic RV Percent Paced: 0 %
Date Time Interrogation Session: 20211124032100
Implantable Lead Implant Date: 20180524
Implantable Lead Implant Date: 20180524
Implantable Lead Location: 753859
Implantable Lead Location: 753860
Implantable Lead Model: 7740
Implantable Lead Model: 7741
Implantable Lead Serial Number: 693708
Implantable Lead Serial Number: 861246
Implantable Pulse Generator Implant Date: 20180524
Lead Channel Impedance Value: 547 Ohm
Lead Channel Impedance Value: 678 Ohm
Lead Channel Pacing Threshold Amplitude: 0.4 V
Lead Channel Pacing Threshold Amplitude: 1.2 V
Lead Channel Pacing Threshold Pulse Width: 0.4 ms
Lead Channel Pacing Threshold Pulse Width: 0.4 ms
Lead Channel Setting Pacing Amplitude: 2 V
Lead Channel Setting Pacing Amplitude: 2.4 V
Lead Channel Setting Pacing Pulse Width: 0.4 ms
Lead Channel Setting Sensing Sensitivity: 2 mV
Pulse Gen Serial Number: 308044

## 2020-01-04 DIAGNOSIS — B349 Viral infection, unspecified: Secondary | ICD-10-CM | POA: Diagnosis not present

## 2020-01-04 DIAGNOSIS — Z03818 Encounter for observation for suspected exposure to other biological agents ruled out: Secondary | ICD-10-CM | POA: Diagnosis not present

## 2020-01-04 DIAGNOSIS — R059 Cough, unspecified: Secondary | ICD-10-CM | POA: Diagnosis not present

## 2020-01-08 DIAGNOSIS — R296 Repeated falls: Secondary | ICD-10-CM | POA: Diagnosis not present

## 2020-01-08 DIAGNOSIS — J069 Acute upper respiratory infection, unspecified: Secondary | ICD-10-CM | POA: Diagnosis not present

## 2020-01-08 DIAGNOSIS — I4891 Unspecified atrial fibrillation: Secondary | ICD-10-CM | POA: Diagnosis not present

## 2020-01-08 DIAGNOSIS — I5032 Chronic diastolic (congestive) heart failure: Secondary | ICD-10-CM | POA: Diagnosis not present

## 2020-01-09 NOTE — Progress Notes (Signed)
Remote pacemaker transmission.   

## 2020-01-11 DIAGNOSIS — R69 Illness, unspecified: Secondary | ICD-10-CM | POA: Diagnosis not present

## 2020-01-11 DIAGNOSIS — E782 Mixed hyperlipidemia: Secondary | ICD-10-CM | POA: Diagnosis not present

## 2020-01-11 DIAGNOSIS — I10 Essential (primary) hypertension: Secondary | ICD-10-CM | POA: Diagnosis not present

## 2020-01-11 DIAGNOSIS — I129 Hypertensive chronic kidney disease with stage 1 through stage 4 chronic kidney disease, or unspecified chronic kidney disease: Secondary | ICD-10-CM | POA: Diagnosis not present

## 2020-01-11 DIAGNOSIS — K219 Gastro-esophageal reflux disease without esophagitis: Secondary | ICD-10-CM | POA: Diagnosis not present

## 2020-01-11 DIAGNOSIS — I4891 Unspecified atrial fibrillation: Secondary | ICD-10-CM | POA: Diagnosis not present

## 2020-01-11 DIAGNOSIS — J441 Chronic obstructive pulmonary disease with (acute) exacerbation: Secondary | ICD-10-CM | POA: Diagnosis not present

## 2020-01-11 DIAGNOSIS — J411 Mucopurulent chronic bronchitis: Secondary | ICD-10-CM | POA: Diagnosis not present

## 2020-01-11 DIAGNOSIS — D649 Anemia, unspecified: Secondary | ICD-10-CM | POA: Diagnosis not present

## 2020-01-11 DIAGNOSIS — I5032 Chronic diastolic (congestive) heart failure: Secondary | ICD-10-CM | POA: Diagnosis not present

## 2020-01-14 DIAGNOSIS — Z23 Encounter for immunization: Secondary | ICD-10-CM | POA: Diagnosis not present

## 2020-01-23 DIAGNOSIS — N189 Chronic kidney disease, unspecified: Secondary | ICD-10-CM | POA: Diagnosis not present

## 2020-01-23 DIAGNOSIS — N2581 Secondary hyperparathyroidism of renal origin: Secondary | ICD-10-CM | POA: Diagnosis not present

## 2020-01-23 DIAGNOSIS — I129 Hypertensive chronic kidney disease with stage 1 through stage 4 chronic kidney disease, or unspecified chronic kidney disease: Secondary | ICD-10-CM | POA: Diagnosis not present

## 2020-01-23 DIAGNOSIS — N1831 Chronic kidney disease, stage 3a: Secondary | ICD-10-CM | POA: Diagnosis not present

## 2020-01-23 DIAGNOSIS — I5030 Unspecified diastolic (congestive) heart failure: Secondary | ICD-10-CM | POA: Diagnosis not present

## 2020-01-23 DIAGNOSIS — N179 Acute kidney failure, unspecified: Secondary | ICD-10-CM | POA: Diagnosis not present

## 2020-01-23 DIAGNOSIS — D631 Anemia in chronic kidney disease: Secondary | ICD-10-CM | POA: Diagnosis not present

## 2020-02-11 DIAGNOSIS — I5032 Chronic diastolic (congestive) heart failure: Secondary | ICD-10-CM | POA: Diagnosis not present

## 2020-02-11 DIAGNOSIS — D649 Anemia, unspecified: Secondary | ICD-10-CM | POA: Diagnosis not present

## 2020-02-11 DIAGNOSIS — M810 Age-related osteoporosis without current pathological fracture: Secondary | ICD-10-CM | POA: Diagnosis not present

## 2020-02-11 DIAGNOSIS — K219 Gastro-esophageal reflux disease without esophagitis: Secondary | ICD-10-CM | POA: Diagnosis not present

## 2020-02-11 DIAGNOSIS — I129 Hypertensive chronic kidney disease with stage 1 through stage 4 chronic kidney disease, or unspecified chronic kidney disease: Secondary | ICD-10-CM | POA: Diagnosis not present

## 2020-02-11 DIAGNOSIS — J441 Chronic obstructive pulmonary disease with (acute) exacerbation: Secondary | ICD-10-CM | POA: Diagnosis not present

## 2020-02-11 DIAGNOSIS — I10 Essential (primary) hypertension: Secondary | ICD-10-CM | POA: Diagnosis not present

## 2020-02-11 DIAGNOSIS — I4891 Unspecified atrial fibrillation: Secondary | ICD-10-CM | POA: Diagnosis not present

## 2020-02-11 DIAGNOSIS — J411 Mucopurulent chronic bronchitis: Secondary | ICD-10-CM | POA: Diagnosis not present

## 2020-02-11 DIAGNOSIS — E782 Mixed hyperlipidemia: Secondary | ICD-10-CM | POA: Diagnosis not present

## 2020-03-18 DIAGNOSIS — I119 Hypertensive heart disease without heart failure: Secondary | ICD-10-CM | POA: Diagnosis not present

## 2020-03-18 DIAGNOSIS — N289 Disorder of kidney and ureter, unspecified: Secondary | ICD-10-CM | POA: Diagnosis not present

## 2020-03-18 DIAGNOSIS — I5032 Chronic diastolic (congestive) heart failure: Secondary | ICD-10-CM | POA: Diagnosis not present

## 2020-03-18 DIAGNOSIS — E875 Hyperkalemia: Secondary | ICD-10-CM | POA: Diagnosis not present

## 2020-03-21 DIAGNOSIS — K219 Gastro-esophageal reflux disease without esophagitis: Secondary | ICD-10-CM | POA: Diagnosis not present

## 2020-03-21 DIAGNOSIS — I129 Hypertensive chronic kidney disease with stage 1 through stage 4 chronic kidney disease, or unspecified chronic kidney disease: Secondary | ICD-10-CM | POA: Diagnosis not present

## 2020-03-21 DIAGNOSIS — E782 Mixed hyperlipidemia: Secondary | ICD-10-CM | POA: Diagnosis not present

## 2020-03-21 DIAGNOSIS — I4891 Unspecified atrial fibrillation: Secondary | ICD-10-CM | POA: Diagnosis not present

## 2020-03-21 DIAGNOSIS — I10 Essential (primary) hypertension: Secondary | ICD-10-CM | POA: Diagnosis not present

## 2020-03-21 DIAGNOSIS — J411 Mucopurulent chronic bronchitis: Secondary | ICD-10-CM | POA: Diagnosis not present

## 2020-03-21 DIAGNOSIS — D649 Anemia, unspecified: Secondary | ICD-10-CM | POA: Diagnosis not present

## 2020-03-21 DIAGNOSIS — I5032 Chronic diastolic (congestive) heart failure: Secondary | ICD-10-CM | POA: Diagnosis not present

## 2020-03-21 DIAGNOSIS — J441 Chronic obstructive pulmonary disease with (acute) exacerbation: Secondary | ICD-10-CM | POA: Diagnosis not present

## 2020-03-21 DIAGNOSIS — M810 Age-related osteoporosis without current pathological fracture: Secondary | ICD-10-CM | POA: Diagnosis not present

## 2020-04-02 ENCOUNTER — Ambulatory Visit (INDEPENDENT_AMBULATORY_CARE_PROVIDER_SITE_OTHER): Payer: Medicare HMO

## 2020-04-02 DIAGNOSIS — R001 Bradycardia, unspecified: Secondary | ICD-10-CM | POA: Diagnosis not present

## 2020-04-03 LAB — CUP PACEART REMOTE DEVICE CHECK
Battery Remaining Longevity: 72 mo
Battery Remaining Percentage: 100 %
Brady Statistic RA Percent Paced: 72 %
Brady Statistic RV Percent Paced: 0 %
Date Time Interrogation Session: 20220223032100
Implantable Lead Implant Date: 20180524
Implantable Lead Implant Date: 20180524
Implantable Lead Location: 753859
Implantable Lead Location: 753860
Implantable Lead Model: 7740
Implantable Lead Model: 7741
Implantable Lead Serial Number: 693708
Implantable Lead Serial Number: 861246
Implantable Pulse Generator Implant Date: 20180524
Lead Channel Impedance Value: 545 Ohm
Lead Channel Impedance Value: 606 Ohm
Lead Channel Pacing Threshold Amplitude: 0.4 V
Lead Channel Pacing Threshold Amplitude: 1.2 V
Lead Channel Pacing Threshold Pulse Width: 0.4 ms
Lead Channel Pacing Threshold Pulse Width: 0.4 ms
Lead Channel Setting Pacing Amplitude: 2 V
Lead Channel Setting Pacing Amplitude: 2.4 V
Lead Channel Setting Pacing Pulse Width: 0.4 ms
Lead Channel Setting Sensing Sensitivity: 2 mV
Pulse Gen Serial Number: 308044

## 2020-04-08 DIAGNOSIS — I4891 Unspecified atrial fibrillation: Secondary | ICD-10-CM | POA: Diagnosis not present

## 2020-04-08 DIAGNOSIS — I129 Hypertensive chronic kidney disease with stage 1 through stage 4 chronic kidney disease, or unspecified chronic kidney disease: Secondary | ICD-10-CM | POA: Diagnosis not present

## 2020-04-08 DIAGNOSIS — K219 Gastro-esophageal reflux disease without esophagitis: Secondary | ICD-10-CM | POA: Diagnosis not present

## 2020-04-08 DIAGNOSIS — J411 Mucopurulent chronic bronchitis: Secondary | ICD-10-CM | POA: Diagnosis not present

## 2020-04-08 DIAGNOSIS — J441 Chronic obstructive pulmonary disease with (acute) exacerbation: Secondary | ICD-10-CM | POA: Diagnosis not present

## 2020-04-08 DIAGNOSIS — I10 Essential (primary) hypertension: Secondary | ICD-10-CM | POA: Diagnosis not present

## 2020-04-08 DIAGNOSIS — I5032 Chronic diastolic (congestive) heart failure: Secondary | ICD-10-CM | POA: Diagnosis not present

## 2020-04-08 DIAGNOSIS — E782 Mixed hyperlipidemia: Secondary | ICD-10-CM | POA: Diagnosis not present

## 2020-04-08 DIAGNOSIS — D649 Anemia, unspecified: Secondary | ICD-10-CM | POA: Diagnosis not present

## 2020-04-08 DIAGNOSIS — R69 Illness, unspecified: Secondary | ICD-10-CM | POA: Diagnosis not present

## 2020-04-11 NOTE — Progress Notes (Signed)
Remote pacemaker transmission.   

## 2020-05-02 DIAGNOSIS — M79605 Pain in left leg: Secondary | ICD-10-CM | POA: Diagnosis not present

## 2020-05-02 DIAGNOSIS — M79604 Pain in right leg: Secondary | ICD-10-CM | POA: Diagnosis not present

## 2020-05-13 DIAGNOSIS — M1990 Unspecified osteoarthritis, unspecified site: Secondary | ICD-10-CM | POA: Diagnosis not present

## 2020-05-13 DIAGNOSIS — J441 Chronic obstructive pulmonary disease with (acute) exacerbation: Secondary | ICD-10-CM | POA: Diagnosis not present

## 2020-05-13 DIAGNOSIS — I5032 Chronic diastolic (congestive) heart failure: Secondary | ICD-10-CM | POA: Diagnosis not present

## 2020-05-13 DIAGNOSIS — E782 Mixed hyperlipidemia: Secondary | ICD-10-CM | POA: Diagnosis not present

## 2020-05-13 DIAGNOSIS — K219 Gastro-esophageal reflux disease without esophagitis: Secondary | ICD-10-CM | POA: Diagnosis not present

## 2020-05-13 DIAGNOSIS — I10 Essential (primary) hypertension: Secondary | ICD-10-CM | POA: Diagnosis not present

## 2020-05-13 DIAGNOSIS — I4891 Unspecified atrial fibrillation: Secondary | ICD-10-CM | POA: Diagnosis not present

## 2020-05-13 DIAGNOSIS — R69 Illness, unspecified: Secondary | ICD-10-CM | POA: Diagnosis not present

## 2020-05-13 DIAGNOSIS — D649 Anemia, unspecified: Secondary | ICD-10-CM | POA: Diagnosis not present

## 2020-05-13 DIAGNOSIS — I129 Hypertensive chronic kidney disease with stage 1 through stage 4 chronic kidney disease, or unspecified chronic kidney disease: Secondary | ICD-10-CM | POA: Diagnosis not present

## 2020-06-27 DIAGNOSIS — R2681 Unsteadiness on feet: Secondary | ICD-10-CM | POA: Diagnosis not present

## 2020-06-27 DIAGNOSIS — T148XXA Other injury of unspecified body region, initial encounter: Secondary | ICD-10-CM | POA: Diagnosis not present

## 2020-06-27 DIAGNOSIS — I2510 Atherosclerotic heart disease of native coronary artery without angina pectoris: Secondary | ICD-10-CM | POA: Diagnosis not present

## 2020-06-27 DIAGNOSIS — E782 Mixed hyperlipidemia: Secondary | ICD-10-CM | POA: Diagnosis not present

## 2020-06-27 DIAGNOSIS — W19XXXA Unspecified fall, initial encounter: Secondary | ICD-10-CM | POA: Diagnosis not present

## 2020-06-27 DIAGNOSIS — I10 Essential (primary) hypertension: Secondary | ICD-10-CM | POA: Diagnosis not present

## 2020-07-02 ENCOUNTER — Other Ambulatory Visit: Payer: Self-pay | Admitting: Pharmacist

## 2020-07-02 ENCOUNTER — Ambulatory Visit (INDEPENDENT_AMBULATORY_CARE_PROVIDER_SITE_OTHER): Payer: Medicare HMO

## 2020-07-02 ENCOUNTER — Other Ambulatory Visit: Payer: Self-pay | Admitting: Cardiology

## 2020-07-02 DIAGNOSIS — I495 Sick sinus syndrome: Secondary | ICD-10-CM | POA: Diagnosis not present

## 2020-07-02 MED ORDER — ROSUVASTATIN CALCIUM 10 MG PO TABS: 10.0000 mg | ORAL_TABLET | Freq: Every day | ORAL | 3 refills | Status: DC

## 2020-07-03 LAB — CUP PACEART REMOTE DEVICE CHECK
Battery Remaining Longevity: 72 mo
Battery Remaining Percentage: 100 %
Brady Statistic RA Percent Paced: 75 %
Brady Statistic RV Percent Paced: 0 %
Date Time Interrogation Session: 20220525032200
Implantable Lead Implant Date: 20180524
Implantable Lead Implant Date: 20180524
Implantable Lead Location: 753859
Implantable Lead Location: 753860
Implantable Lead Model: 7740
Implantable Lead Model: 7741
Implantable Lead Serial Number: 693708
Implantable Lead Serial Number: 861246
Implantable Pulse Generator Implant Date: 20180524
Lead Channel Impedance Value: 554 Ohm
Lead Channel Impedance Value: 592 Ohm
Lead Channel Pacing Threshold Amplitude: 0.4 V
Lead Channel Pacing Threshold Amplitude: 1.4 V
Lead Channel Pacing Threshold Pulse Width: 0.4 ms
Lead Channel Pacing Threshold Pulse Width: 0.4 ms
Lead Channel Setting Pacing Amplitude: 2 V
Lead Channel Setting Pacing Amplitude: 2.4 V
Lead Channel Setting Pacing Pulse Width: 0.4 ms
Lead Channel Setting Sensing Sensitivity: 2 mV
Pulse Gen Serial Number: 308044

## 2020-07-08 DIAGNOSIS — I129 Hypertensive chronic kidney disease with stage 1 through stage 4 chronic kidney disease, or unspecified chronic kidney disease: Secondary | ICD-10-CM | POA: Diagnosis not present

## 2020-07-08 DIAGNOSIS — J441 Chronic obstructive pulmonary disease with (acute) exacerbation: Secondary | ICD-10-CM | POA: Diagnosis not present

## 2020-07-08 DIAGNOSIS — R69 Illness, unspecified: Secondary | ICD-10-CM | POA: Diagnosis not present

## 2020-07-08 DIAGNOSIS — K219 Gastro-esophageal reflux disease without esophagitis: Secondary | ICD-10-CM | POA: Diagnosis not present

## 2020-07-08 DIAGNOSIS — E782 Mixed hyperlipidemia: Secondary | ICD-10-CM | POA: Diagnosis not present

## 2020-07-08 DIAGNOSIS — M1990 Unspecified osteoarthritis, unspecified site: Secondary | ICD-10-CM | POA: Diagnosis not present

## 2020-07-08 DIAGNOSIS — I4891 Unspecified atrial fibrillation: Secondary | ICD-10-CM | POA: Diagnosis not present

## 2020-07-08 DIAGNOSIS — I10 Essential (primary) hypertension: Secondary | ICD-10-CM | POA: Diagnosis not present

## 2020-07-08 DIAGNOSIS — I5032 Chronic diastolic (congestive) heart failure: Secondary | ICD-10-CM | POA: Diagnosis not present

## 2020-07-08 DIAGNOSIS — D649 Anemia, unspecified: Secondary | ICD-10-CM | POA: Diagnosis not present

## 2020-07-11 DIAGNOSIS — I4891 Unspecified atrial fibrillation: Secondary | ICD-10-CM | POA: Diagnosis not present

## 2020-07-11 DIAGNOSIS — E782 Mixed hyperlipidemia: Secondary | ICD-10-CM | POA: Diagnosis not present

## 2020-07-11 DIAGNOSIS — R69 Illness, unspecified: Secondary | ICD-10-CM | POA: Diagnosis not present

## 2020-07-11 DIAGNOSIS — I2510 Atherosclerotic heart disease of native coronary artery without angina pectoris: Secondary | ICD-10-CM | POA: Diagnosis not present

## 2020-07-11 DIAGNOSIS — R2681 Unsteadiness on feet: Secondary | ICD-10-CM | POA: Diagnosis not present

## 2020-07-11 DIAGNOSIS — Z0000 Encounter for general adult medical examination without abnormal findings: Secondary | ICD-10-CM | POA: Diagnosis not present

## 2020-07-11 DIAGNOSIS — R296 Repeated falls: Secondary | ICD-10-CM | POA: Diagnosis not present

## 2020-07-11 DIAGNOSIS — I10 Essential (primary) hypertension: Secondary | ICD-10-CM | POA: Diagnosis not present

## 2020-07-25 NOTE — Progress Notes (Signed)
Remote pacemaker transmission.   

## 2020-08-05 ENCOUNTER — Telehealth: Payer: Self-pay | Admitting: Pulmonary Disease

## 2020-08-05 DIAGNOSIS — K219 Gastro-esophageal reflux disease without esophagitis: Secondary | ICD-10-CM | POA: Diagnosis not present

## 2020-08-05 DIAGNOSIS — M1990 Unspecified osteoarthritis, unspecified site: Secondary | ICD-10-CM | POA: Diagnosis not present

## 2020-08-05 DIAGNOSIS — I4891 Unspecified atrial fibrillation: Secondary | ICD-10-CM | POA: Diagnosis not present

## 2020-08-05 DIAGNOSIS — J441 Chronic obstructive pulmonary disease with (acute) exacerbation: Secondary | ICD-10-CM | POA: Diagnosis not present

## 2020-08-05 DIAGNOSIS — E782 Mixed hyperlipidemia: Secondary | ICD-10-CM | POA: Diagnosis not present

## 2020-08-05 DIAGNOSIS — J411 Mucopurulent chronic bronchitis: Secondary | ICD-10-CM | POA: Diagnosis not present

## 2020-08-05 DIAGNOSIS — I10 Essential (primary) hypertension: Secondary | ICD-10-CM | POA: Diagnosis not present

## 2020-08-05 DIAGNOSIS — I5032 Chronic diastolic (congestive) heart failure: Secondary | ICD-10-CM | POA: Diagnosis not present

## 2020-08-05 DIAGNOSIS — I129 Hypertensive chronic kidney disease with stage 1 through stage 4 chronic kidney disease, or unspecified chronic kidney disease: Secondary | ICD-10-CM | POA: Diagnosis not present

## 2020-08-05 NOTE — Telephone Encounter (Signed)
Left message for Dollene Cleveland to return call

## 2020-08-07 MED ORDER — MONTELUKAST SODIUM 10 MG PO TABS: 10.0000 mg | ORAL_TABLET | Freq: Every day | ORAL | 3 refills | Status: DC

## 2020-08-07 NOTE — Telephone Encounter (Signed)
Called and spoke to pt. Pt states she is still needing a refill of Singulair. Pt is due for an appt. Appt scheduled with Dr. Halford Chessman on 09/16/20. Singulair sent to preferred pharmacy to last until upcoming appt. Pt verbalized understanding and denied any further questions or concerns at this time.

## 2020-08-26 DIAGNOSIS — I4891 Unspecified atrial fibrillation: Secondary | ICD-10-CM | POA: Diagnosis not present

## 2020-08-26 DIAGNOSIS — I5032 Chronic diastolic (congestive) heart failure: Secondary | ICD-10-CM | POA: Diagnosis not present

## 2020-08-28 DIAGNOSIS — I5032 Chronic diastolic (congestive) heart failure: Secondary | ICD-10-CM | POA: Diagnosis not present

## 2020-09-04 DIAGNOSIS — I129 Hypertensive chronic kidney disease with stage 1 through stage 4 chronic kidney disease, or unspecified chronic kidney disease: Secondary | ICD-10-CM | POA: Diagnosis not present

## 2020-09-04 DIAGNOSIS — J411 Mucopurulent chronic bronchitis: Secondary | ICD-10-CM | POA: Diagnosis not present

## 2020-09-04 DIAGNOSIS — R69 Illness, unspecified: Secondary | ICD-10-CM | POA: Diagnosis not present

## 2020-09-04 DIAGNOSIS — J441 Chronic obstructive pulmonary disease with (acute) exacerbation: Secondary | ICD-10-CM | POA: Diagnosis not present

## 2020-09-04 DIAGNOSIS — K219 Gastro-esophageal reflux disease without esophagitis: Secondary | ICD-10-CM | POA: Diagnosis not present

## 2020-09-04 DIAGNOSIS — I4891 Unspecified atrial fibrillation: Secondary | ICD-10-CM | POA: Diagnosis not present

## 2020-09-04 DIAGNOSIS — D649 Anemia, unspecified: Secondary | ICD-10-CM | POA: Diagnosis not present

## 2020-09-04 DIAGNOSIS — E782 Mixed hyperlipidemia: Secondary | ICD-10-CM | POA: Diagnosis not present

## 2020-09-04 DIAGNOSIS — I5032 Chronic diastolic (congestive) heart failure: Secondary | ICD-10-CM | POA: Diagnosis not present

## 2020-09-10 DIAGNOSIS — Z23 Encounter for immunization: Secondary | ICD-10-CM | POA: Diagnosis not present

## 2020-09-10 DIAGNOSIS — I5032 Chronic diastolic (congestive) heart failure: Secondary | ICD-10-CM | POA: Diagnosis not present

## 2020-09-10 DIAGNOSIS — J411 Mucopurulent chronic bronchitis: Secondary | ICD-10-CM | POA: Diagnosis not present

## 2020-09-15 DIAGNOSIS — M19042 Primary osteoarthritis, left hand: Secondary | ICD-10-CM | POA: Diagnosis not present

## 2020-09-15 DIAGNOSIS — R300 Dysuria: Secondary | ICD-10-CM | POA: Diagnosis not present

## 2020-09-16 ENCOUNTER — Ambulatory Visit: Payer: Medicare HMO | Admitting: Pulmonary Disease

## 2020-09-25 DIAGNOSIS — S52611A Displaced fracture of right ulna styloid process, initial encounter for closed fracture: Secondary | ICD-10-CM | POA: Diagnosis not present

## 2020-09-25 DIAGNOSIS — S0181XA Laceration without foreign body of other part of head, initial encounter: Secondary | ICD-10-CM | POA: Diagnosis not present

## 2020-09-25 DIAGNOSIS — M25531 Pain in right wrist: Secondary | ICD-10-CM | POA: Diagnosis not present

## 2020-09-25 DIAGNOSIS — W1839XA Other fall on same level, initial encounter: Secondary | ICD-10-CM | POA: Diagnosis not present

## 2020-10-01 ENCOUNTER — Ambulatory Visit (INDEPENDENT_AMBULATORY_CARE_PROVIDER_SITE_OTHER): Payer: Medicare HMO

## 2020-10-01 DIAGNOSIS — M810 Age-related osteoporosis without current pathological fracture: Secondary | ICD-10-CM | POA: Diagnosis not present

## 2020-10-01 DIAGNOSIS — J441 Chronic obstructive pulmonary disease with (acute) exacerbation: Secondary | ICD-10-CM | POA: Diagnosis not present

## 2020-10-01 DIAGNOSIS — K219 Gastro-esophageal reflux disease without esophagitis: Secondary | ICD-10-CM | POA: Diagnosis not present

## 2020-10-01 DIAGNOSIS — I5032 Chronic diastolic (congestive) heart failure: Secondary | ICD-10-CM | POA: Diagnosis not present

## 2020-10-01 DIAGNOSIS — I495 Sick sinus syndrome: Secondary | ICD-10-CM | POA: Diagnosis not present

## 2020-10-01 DIAGNOSIS — I10 Essential (primary) hypertension: Secondary | ICD-10-CM | POA: Diagnosis not present

## 2020-10-01 DIAGNOSIS — I2510 Atherosclerotic heart disease of native coronary artery without angina pectoris: Secondary | ICD-10-CM | POA: Diagnosis not present

## 2020-10-01 DIAGNOSIS — M1990 Unspecified osteoarthritis, unspecified site: Secondary | ICD-10-CM | POA: Diagnosis not present

## 2020-10-01 DIAGNOSIS — E782 Mixed hyperlipidemia: Secondary | ICD-10-CM | POA: Diagnosis not present

## 2020-10-01 DIAGNOSIS — J411 Mucopurulent chronic bronchitis: Secondary | ICD-10-CM | POA: Diagnosis not present

## 2020-10-01 DIAGNOSIS — I129 Hypertensive chronic kidney disease with stage 1 through stage 4 chronic kidney disease, or unspecified chronic kidney disease: Secondary | ICD-10-CM | POA: Diagnosis not present

## 2020-10-01 LAB — CUP PACEART REMOTE DEVICE CHECK
Battery Remaining Longevity: 66 mo
Battery Remaining Percentage: 100 %
Brady Statistic RA Percent Paced: 76 %
Brady Statistic RV Percent Paced: 0 %
Date Time Interrogation Session: 20220824032100
Implantable Lead Implant Date: 20180524
Implantable Lead Implant Date: 20180524
Implantable Lead Location: 753859
Implantable Lead Location: 753860
Implantable Lead Model: 7740
Implantable Lead Model: 7741
Implantable Lead Serial Number: 693708
Implantable Lead Serial Number: 861246
Implantable Pulse Generator Implant Date: 20180524
Lead Channel Impedance Value: 558 Ohm
Lead Channel Impedance Value: 638 Ohm
Lead Channel Pacing Threshold Amplitude: 0.4 V
Lead Channel Pacing Threshold Amplitude: 1.4 V
Lead Channel Pacing Threshold Pulse Width: 0.4 ms
Lead Channel Pacing Threshold Pulse Width: 0.4 ms
Lead Channel Setting Pacing Amplitude: 2 V
Lead Channel Setting Pacing Amplitude: 2.4 V
Lead Channel Setting Pacing Pulse Width: 0.4 ms
Lead Channel Setting Sensing Sensitivity: 2 mV
Pulse Gen Serial Number: 308044

## 2020-10-03 DIAGNOSIS — S52611A Displaced fracture of right ulna styloid process, initial encounter for closed fracture: Secondary | ICD-10-CM | POA: Diagnosis not present

## 2020-10-03 DIAGNOSIS — R2681 Unsteadiness on feet: Secondary | ICD-10-CM | POA: Diagnosis not present

## 2020-10-03 DIAGNOSIS — Z23 Encounter for immunization: Secondary | ICD-10-CM | POA: Diagnosis not present

## 2020-10-03 DIAGNOSIS — S62101D Fracture of unspecified carpal bone, right wrist, subsequent encounter for fracture with routine healing: Secondary | ICD-10-CM | POA: Diagnosis not present

## 2020-10-03 DIAGNOSIS — W19XXXS Unspecified fall, sequela: Secondary | ICD-10-CM | POA: Diagnosis not present

## 2020-10-03 DIAGNOSIS — R296 Repeated falls: Secondary | ICD-10-CM | POA: Diagnosis not present

## 2020-10-03 DIAGNOSIS — M4800 Spinal stenosis, site unspecified: Secondary | ICD-10-CM | POA: Diagnosis not present

## 2020-10-03 DIAGNOSIS — N390 Urinary tract infection, site not specified: Secondary | ICD-10-CM | POA: Diagnosis not present

## 2020-10-08 DIAGNOSIS — K582 Mixed irritable bowel syndrome: Secondary | ICD-10-CM | POA: Diagnosis not present

## 2020-10-08 DIAGNOSIS — K5792 Diverticulitis of intestine, part unspecified, without perforation or abscess without bleeding: Secondary | ICD-10-CM | POA: Diagnosis not present

## 2020-10-08 DIAGNOSIS — M80041D Age-related osteoporosis with current pathological fracture, right hand, subsequent encounter for fracture with routine healing: Secondary | ICD-10-CM | POA: Diagnosis not present

## 2020-10-08 DIAGNOSIS — I4891 Unspecified atrial fibrillation: Secondary | ICD-10-CM | POA: Diagnosis not present

## 2020-10-08 DIAGNOSIS — I2510 Atherosclerotic heart disease of native coronary artery without angina pectoris: Secondary | ICD-10-CM | POA: Diagnosis not present

## 2020-10-08 DIAGNOSIS — M109 Gout, unspecified: Secondary | ICD-10-CM | POA: Diagnosis not present

## 2020-10-08 DIAGNOSIS — I130 Hypertensive heart and chronic kidney disease with heart failure and stage 1 through stage 4 chronic kidney disease, or unspecified chronic kidney disease: Secondary | ICD-10-CM | POA: Diagnosis not present

## 2020-10-08 DIAGNOSIS — R32 Unspecified urinary incontinence: Secondary | ICD-10-CM | POA: Diagnosis not present

## 2020-10-08 DIAGNOSIS — J411 Mucopurulent chronic bronchitis: Secondary | ICD-10-CM | POA: Diagnosis not present

## 2020-10-08 DIAGNOSIS — M19042 Primary osteoarthritis, left hand: Secondary | ICD-10-CM | POA: Diagnosis not present

## 2020-10-08 DIAGNOSIS — F3341 Major depressive disorder, recurrent, in partial remission: Secondary | ICD-10-CM | POA: Diagnosis not present

## 2020-10-08 DIAGNOSIS — K219 Gastro-esophageal reflux disease without esophagitis: Secondary | ICD-10-CM | POA: Diagnosis not present

## 2020-10-08 DIAGNOSIS — F419 Anxiety disorder, unspecified: Secondary | ICD-10-CM | POA: Diagnosis not present

## 2020-10-08 DIAGNOSIS — B9620 Unspecified Escherichia coli [E. coli] as the cause of diseases classified elsewhere: Secondary | ICD-10-CM | POA: Diagnosis not present

## 2020-10-08 DIAGNOSIS — M6289 Other specified disorders of muscle: Secondary | ICD-10-CM | POA: Diagnosis not present

## 2020-10-08 DIAGNOSIS — M80031D Age-related osteoporosis with current pathological fracture, right forearm, subsequent encounter for fracture with routine healing: Secondary | ICD-10-CM | POA: Diagnosis not present

## 2020-10-08 DIAGNOSIS — N390 Urinary tract infection, site not specified: Secondary | ICD-10-CM | POA: Diagnosis not present

## 2020-10-08 DIAGNOSIS — H9193 Unspecified hearing loss, bilateral: Secondary | ICD-10-CM | POA: Diagnosis not present

## 2020-10-08 DIAGNOSIS — M6530 Trigger finger, unspecified finger: Secondary | ICD-10-CM | POA: Diagnosis not present

## 2020-10-08 DIAGNOSIS — M25531 Pain in right wrist: Secondary | ICD-10-CM | POA: Diagnosis not present

## 2020-10-08 DIAGNOSIS — M8580 Other specified disorders of bone density and structure, unspecified site: Secondary | ICD-10-CM | POA: Diagnosis not present

## 2020-10-08 DIAGNOSIS — M4800 Spinal stenosis, site unspecified: Secondary | ICD-10-CM | POA: Diagnosis not present

## 2020-10-08 DIAGNOSIS — D631 Anemia in chronic kidney disease: Secondary | ICD-10-CM | POA: Diagnosis not present

## 2020-10-08 DIAGNOSIS — E782 Mixed hyperlipidemia: Secondary | ICD-10-CM | POA: Diagnosis not present

## 2020-10-08 DIAGNOSIS — S52501A Unspecified fracture of the lower end of right radius, initial encounter for closed fracture: Secondary | ICD-10-CM | POA: Diagnosis not present

## 2020-10-08 DIAGNOSIS — I5032 Chronic diastolic (congestive) heart failure: Secondary | ICD-10-CM | POA: Diagnosis not present

## 2020-10-08 DIAGNOSIS — I051 Rheumatic mitral insufficiency: Secondary | ICD-10-CM | POA: Diagnosis not present

## 2020-10-08 DIAGNOSIS — R102 Pelvic and perineal pain: Secondary | ICD-10-CM | POA: Diagnosis not present

## 2020-10-08 DIAGNOSIS — N189 Chronic kidney disease, unspecified: Secondary | ICD-10-CM | POA: Diagnosis not present

## 2020-10-09 DIAGNOSIS — K5792 Diverticulitis of intestine, part unspecified, without perforation or abscess without bleeding: Secondary | ICD-10-CM | POA: Diagnosis not present

## 2020-10-09 DIAGNOSIS — M109 Gout, unspecified: Secondary | ICD-10-CM | POA: Diagnosis not present

## 2020-10-09 DIAGNOSIS — I130 Hypertensive heart and chronic kidney disease with heart failure and stage 1 through stage 4 chronic kidney disease, or unspecified chronic kidney disease: Secondary | ICD-10-CM | POA: Diagnosis not present

## 2020-10-09 DIAGNOSIS — M4800 Spinal stenosis, site unspecified: Secondary | ICD-10-CM | POA: Diagnosis not present

## 2020-10-09 DIAGNOSIS — H9193 Unspecified hearing loss, bilateral: Secondary | ICD-10-CM | POA: Diagnosis not present

## 2020-10-09 DIAGNOSIS — B9620 Unspecified Escherichia coli [E. coli] as the cause of diseases classified elsewhere: Secondary | ICD-10-CM | POA: Diagnosis not present

## 2020-10-09 DIAGNOSIS — F3341 Major depressive disorder, recurrent, in partial remission: Secondary | ICD-10-CM | POA: Diagnosis not present

## 2020-10-09 DIAGNOSIS — R32 Unspecified urinary incontinence: Secondary | ICD-10-CM | POA: Diagnosis not present

## 2020-10-09 DIAGNOSIS — J411 Mucopurulent chronic bronchitis: Secondary | ICD-10-CM | POA: Diagnosis not present

## 2020-10-09 DIAGNOSIS — N390 Urinary tract infection, site not specified: Secondary | ICD-10-CM | POA: Diagnosis not present

## 2020-10-09 DIAGNOSIS — K582 Mixed irritable bowel syndrome: Secondary | ICD-10-CM | POA: Diagnosis not present

## 2020-10-09 DIAGNOSIS — N189 Chronic kidney disease, unspecified: Secondary | ICD-10-CM | POA: Diagnosis not present

## 2020-10-09 DIAGNOSIS — M80031D Age-related osteoporosis with current pathological fracture, right forearm, subsequent encounter for fracture with routine healing: Secondary | ICD-10-CM | POA: Diagnosis not present

## 2020-10-09 DIAGNOSIS — E782 Mixed hyperlipidemia: Secondary | ICD-10-CM | POA: Diagnosis not present

## 2020-10-09 DIAGNOSIS — I5032 Chronic diastolic (congestive) heart failure: Secondary | ICD-10-CM | POA: Diagnosis not present

## 2020-10-09 DIAGNOSIS — M80041D Age-related osteoporosis with current pathological fracture, right hand, subsequent encounter for fracture with routine healing: Secondary | ICD-10-CM | POA: Diagnosis not present

## 2020-10-09 DIAGNOSIS — I4891 Unspecified atrial fibrillation: Secondary | ICD-10-CM | POA: Diagnosis not present

## 2020-10-09 DIAGNOSIS — F419 Anxiety disorder, unspecified: Secondary | ICD-10-CM | POA: Diagnosis not present

## 2020-10-09 DIAGNOSIS — I2510 Atherosclerotic heart disease of native coronary artery without angina pectoris: Secondary | ICD-10-CM | POA: Diagnosis not present

## 2020-10-09 DIAGNOSIS — D631 Anemia in chronic kidney disease: Secondary | ICD-10-CM | POA: Diagnosis not present

## 2020-10-09 DIAGNOSIS — I051 Rheumatic mitral insufficiency: Secondary | ICD-10-CM | POA: Diagnosis not present

## 2020-10-09 DIAGNOSIS — K219 Gastro-esophageal reflux disease without esophagitis: Secondary | ICD-10-CM | POA: Diagnosis not present

## 2020-10-09 DIAGNOSIS — M19042 Primary osteoarthritis, left hand: Secondary | ICD-10-CM | POA: Diagnosis not present

## 2020-10-09 DIAGNOSIS — M8580 Other specified disorders of bone density and structure, unspecified site: Secondary | ICD-10-CM | POA: Diagnosis not present

## 2020-10-14 DIAGNOSIS — M80041D Age-related osteoporosis with current pathological fracture, right hand, subsequent encounter for fracture with routine healing: Secondary | ICD-10-CM | POA: Diagnosis not present

## 2020-10-14 DIAGNOSIS — K219 Gastro-esophageal reflux disease without esophagitis: Secondary | ICD-10-CM | POA: Diagnosis not present

## 2020-10-14 DIAGNOSIS — I051 Rheumatic mitral insufficiency: Secondary | ICD-10-CM | POA: Diagnosis not present

## 2020-10-14 DIAGNOSIS — R32 Unspecified urinary incontinence: Secondary | ICD-10-CM | POA: Diagnosis not present

## 2020-10-14 DIAGNOSIS — I2510 Atherosclerotic heart disease of native coronary artery without angina pectoris: Secondary | ICD-10-CM | POA: Diagnosis not present

## 2020-10-14 DIAGNOSIS — M80031D Age-related osteoporosis with current pathological fracture, right forearm, subsequent encounter for fracture with routine healing: Secondary | ICD-10-CM | POA: Diagnosis not present

## 2020-10-14 DIAGNOSIS — F3341 Major depressive disorder, recurrent, in partial remission: Secondary | ICD-10-CM | POA: Diagnosis not present

## 2020-10-14 DIAGNOSIS — D631 Anemia in chronic kidney disease: Secondary | ICD-10-CM | POA: Diagnosis not present

## 2020-10-14 DIAGNOSIS — J411 Mucopurulent chronic bronchitis: Secondary | ICD-10-CM | POA: Diagnosis not present

## 2020-10-14 DIAGNOSIS — M4800 Spinal stenosis, site unspecified: Secondary | ICD-10-CM | POA: Diagnosis not present

## 2020-10-14 DIAGNOSIS — I4891 Unspecified atrial fibrillation: Secondary | ICD-10-CM | POA: Diagnosis not present

## 2020-10-14 DIAGNOSIS — M8580 Other specified disorders of bone density and structure, unspecified site: Secondary | ICD-10-CM | POA: Diagnosis not present

## 2020-10-14 DIAGNOSIS — K582 Mixed irritable bowel syndrome: Secondary | ICD-10-CM | POA: Diagnosis not present

## 2020-10-14 DIAGNOSIS — F419 Anxiety disorder, unspecified: Secondary | ICD-10-CM | POA: Diagnosis not present

## 2020-10-14 DIAGNOSIS — K5792 Diverticulitis of intestine, part unspecified, without perforation or abscess without bleeding: Secondary | ICD-10-CM | POA: Diagnosis not present

## 2020-10-14 DIAGNOSIS — I5032 Chronic diastolic (congestive) heart failure: Secondary | ICD-10-CM | POA: Diagnosis not present

## 2020-10-14 DIAGNOSIS — H9193 Unspecified hearing loss, bilateral: Secondary | ICD-10-CM | POA: Diagnosis not present

## 2020-10-14 DIAGNOSIS — B9620 Unspecified Escherichia coli [E. coli] as the cause of diseases classified elsewhere: Secondary | ICD-10-CM | POA: Diagnosis not present

## 2020-10-14 DIAGNOSIS — M109 Gout, unspecified: Secondary | ICD-10-CM | POA: Diagnosis not present

## 2020-10-14 DIAGNOSIS — M19042 Primary osteoarthritis, left hand: Secondary | ICD-10-CM | POA: Diagnosis not present

## 2020-10-14 DIAGNOSIS — N390 Urinary tract infection, site not specified: Secondary | ICD-10-CM | POA: Diagnosis not present

## 2020-10-14 DIAGNOSIS — N189 Chronic kidney disease, unspecified: Secondary | ICD-10-CM | POA: Diagnosis not present

## 2020-10-14 DIAGNOSIS — E782 Mixed hyperlipidemia: Secondary | ICD-10-CM | POA: Diagnosis not present

## 2020-10-14 DIAGNOSIS — I130 Hypertensive heart and chronic kidney disease with heart failure and stage 1 through stage 4 chronic kidney disease, or unspecified chronic kidney disease: Secondary | ICD-10-CM | POA: Diagnosis not present

## 2020-10-16 NOTE — Progress Notes (Signed)
Remote pacemaker transmission.   

## 2020-10-21 DIAGNOSIS — I4891 Unspecified atrial fibrillation: Secondary | ICD-10-CM | POA: Diagnosis not present

## 2020-10-21 DIAGNOSIS — M19042 Primary osteoarthritis, left hand: Secondary | ICD-10-CM | POA: Diagnosis not present

## 2020-10-21 DIAGNOSIS — D631 Anemia in chronic kidney disease: Secondary | ICD-10-CM | POA: Diagnosis not present

## 2020-10-21 DIAGNOSIS — R635 Abnormal weight gain: Secondary | ICD-10-CM | POA: Diagnosis not present

## 2020-10-21 DIAGNOSIS — B9620 Unspecified Escherichia coli [E. coli] as the cause of diseases classified elsewhere: Secondary | ICD-10-CM | POA: Diagnosis not present

## 2020-10-21 DIAGNOSIS — J309 Allergic rhinitis, unspecified: Secondary | ICD-10-CM | POA: Diagnosis not present

## 2020-10-21 DIAGNOSIS — J411 Mucopurulent chronic bronchitis: Secondary | ICD-10-CM | POA: Diagnosis not present

## 2020-10-21 DIAGNOSIS — N390 Urinary tract infection, site not specified: Secondary | ICD-10-CM | POA: Diagnosis not present

## 2020-10-21 DIAGNOSIS — N189 Chronic kidney disease, unspecified: Secondary | ICD-10-CM | POA: Diagnosis not present

## 2020-10-21 DIAGNOSIS — I2510 Atherosclerotic heart disease of native coronary artery without angina pectoris: Secondary | ICD-10-CM | POA: Diagnosis not present

## 2020-10-21 DIAGNOSIS — M8580 Other specified disorders of bone density and structure, unspecified site: Secondary | ICD-10-CM | POA: Diagnosis not present

## 2020-10-21 DIAGNOSIS — K5792 Diverticulitis of intestine, part unspecified, without perforation or abscess without bleeding: Secondary | ICD-10-CM | POA: Diagnosis not present

## 2020-10-21 DIAGNOSIS — I051 Rheumatic mitral insufficiency: Secondary | ICD-10-CM | POA: Diagnosis not present

## 2020-10-21 DIAGNOSIS — E782 Mixed hyperlipidemia: Secondary | ICD-10-CM | POA: Diagnosis not present

## 2020-10-21 DIAGNOSIS — R32 Unspecified urinary incontinence: Secondary | ICD-10-CM | POA: Diagnosis not present

## 2020-10-21 DIAGNOSIS — F419 Anxiety disorder, unspecified: Secondary | ICD-10-CM | POA: Diagnosis not present

## 2020-10-21 DIAGNOSIS — K582 Mixed irritable bowel syndrome: Secondary | ICD-10-CM | POA: Diagnosis not present

## 2020-10-21 DIAGNOSIS — M109 Gout, unspecified: Secondary | ICD-10-CM | POA: Diagnosis not present

## 2020-10-21 DIAGNOSIS — M80041D Age-related osteoporosis with current pathological fracture, right hand, subsequent encounter for fracture with routine healing: Secondary | ICD-10-CM | POA: Diagnosis not present

## 2020-10-21 DIAGNOSIS — K219 Gastro-esophageal reflux disease without esophagitis: Secondary | ICD-10-CM | POA: Diagnosis not present

## 2020-10-21 DIAGNOSIS — H9193 Unspecified hearing loss, bilateral: Secondary | ICD-10-CM | POA: Diagnosis not present

## 2020-10-21 DIAGNOSIS — I5032 Chronic diastolic (congestive) heart failure: Secondary | ICD-10-CM | POA: Diagnosis not present

## 2020-10-21 DIAGNOSIS — F3341 Major depressive disorder, recurrent, in partial remission: Secondary | ICD-10-CM | POA: Diagnosis not present

## 2020-10-21 DIAGNOSIS — M4800 Spinal stenosis, site unspecified: Secondary | ICD-10-CM | POA: Diagnosis not present

## 2020-10-21 DIAGNOSIS — I130 Hypertensive heart and chronic kidney disease with heart failure and stage 1 through stage 4 chronic kidney disease, or unspecified chronic kidney disease: Secondary | ICD-10-CM | POA: Diagnosis not present

## 2020-10-21 DIAGNOSIS — M80031D Age-related osteoporosis with current pathological fracture, right forearm, subsequent encounter for fracture with routine healing: Secondary | ICD-10-CM | POA: Diagnosis not present

## 2020-10-23 DIAGNOSIS — D631 Anemia in chronic kidney disease: Secondary | ICD-10-CM | POA: Diagnosis not present

## 2020-10-23 DIAGNOSIS — I5032 Chronic diastolic (congestive) heart failure: Secondary | ICD-10-CM | POA: Diagnosis not present

## 2020-10-23 DIAGNOSIS — M80031D Age-related osteoporosis with current pathological fracture, right forearm, subsequent encounter for fracture with routine healing: Secondary | ICD-10-CM | POA: Diagnosis not present

## 2020-10-23 DIAGNOSIS — E782 Mixed hyperlipidemia: Secondary | ICD-10-CM | POA: Diagnosis not present

## 2020-10-23 DIAGNOSIS — F419 Anxiety disorder, unspecified: Secondary | ICD-10-CM | POA: Diagnosis not present

## 2020-10-23 DIAGNOSIS — N189 Chronic kidney disease, unspecified: Secondary | ICD-10-CM | POA: Diagnosis not present

## 2020-10-23 DIAGNOSIS — M109 Gout, unspecified: Secondary | ICD-10-CM | POA: Diagnosis not present

## 2020-10-23 DIAGNOSIS — I130 Hypertensive heart and chronic kidney disease with heart failure and stage 1 through stage 4 chronic kidney disease, or unspecified chronic kidney disease: Secondary | ICD-10-CM | POA: Diagnosis not present

## 2020-10-23 DIAGNOSIS — K219 Gastro-esophageal reflux disease without esophagitis: Secondary | ICD-10-CM | POA: Diagnosis not present

## 2020-10-23 DIAGNOSIS — H9193 Unspecified hearing loss, bilateral: Secondary | ICD-10-CM | POA: Diagnosis not present

## 2020-10-23 DIAGNOSIS — M4800 Spinal stenosis, site unspecified: Secondary | ICD-10-CM | POA: Diagnosis not present

## 2020-10-23 DIAGNOSIS — J411 Mucopurulent chronic bronchitis: Secondary | ICD-10-CM | POA: Diagnosis not present

## 2020-10-23 DIAGNOSIS — K582 Mixed irritable bowel syndrome: Secondary | ICD-10-CM | POA: Diagnosis not present

## 2020-10-23 DIAGNOSIS — R32 Unspecified urinary incontinence: Secondary | ICD-10-CM | POA: Diagnosis not present

## 2020-10-23 DIAGNOSIS — N390 Urinary tract infection, site not specified: Secondary | ICD-10-CM | POA: Diagnosis not present

## 2020-10-23 DIAGNOSIS — F3341 Major depressive disorder, recurrent, in partial remission: Secondary | ICD-10-CM | POA: Diagnosis not present

## 2020-10-23 DIAGNOSIS — M19042 Primary osteoarthritis, left hand: Secondary | ICD-10-CM | POA: Diagnosis not present

## 2020-10-23 DIAGNOSIS — B9620 Unspecified Escherichia coli [E. coli] as the cause of diseases classified elsewhere: Secondary | ICD-10-CM | POA: Diagnosis not present

## 2020-10-23 DIAGNOSIS — M80041D Age-related osteoporosis with current pathological fracture, right hand, subsequent encounter for fracture with routine healing: Secondary | ICD-10-CM | POA: Diagnosis not present

## 2020-10-23 DIAGNOSIS — I051 Rheumatic mitral insufficiency: Secondary | ICD-10-CM | POA: Diagnosis not present

## 2020-10-23 DIAGNOSIS — I4891 Unspecified atrial fibrillation: Secondary | ICD-10-CM | POA: Diagnosis not present

## 2020-10-23 DIAGNOSIS — K5792 Diverticulitis of intestine, part unspecified, without perforation or abscess without bleeding: Secondary | ICD-10-CM | POA: Diagnosis not present

## 2020-10-23 DIAGNOSIS — I2510 Atherosclerotic heart disease of native coronary artery without angina pectoris: Secondary | ICD-10-CM | POA: Diagnosis not present

## 2020-10-23 DIAGNOSIS — M8580 Other specified disorders of bone density and structure, unspecified site: Secondary | ICD-10-CM | POA: Diagnosis not present

## 2020-10-29 DIAGNOSIS — M810 Age-related osteoporosis without current pathological fracture: Secondary | ICD-10-CM | POA: Diagnosis not present

## 2020-10-29 DIAGNOSIS — I5032 Chronic diastolic (congestive) heart failure: Secondary | ICD-10-CM | POA: Diagnosis not present

## 2020-10-29 DIAGNOSIS — M1990 Unspecified osteoarthritis, unspecified site: Secondary | ICD-10-CM | POA: Diagnosis not present

## 2020-10-29 DIAGNOSIS — E782 Mixed hyperlipidemia: Secondary | ICD-10-CM | POA: Diagnosis not present

## 2020-10-29 DIAGNOSIS — I129 Hypertensive chronic kidney disease with stage 1 through stage 4 chronic kidney disease, or unspecified chronic kidney disease: Secondary | ICD-10-CM | POA: Diagnosis not present

## 2020-10-29 DIAGNOSIS — J411 Mucopurulent chronic bronchitis: Secondary | ICD-10-CM | POA: Diagnosis not present

## 2020-10-29 DIAGNOSIS — K219 Gastro-esophageal reflux disease without esophagitis: Secondary | ICD-10-CM | POA: Diagnosis not present

## 2020-10-29 DIAGNOSIS — I4891 Unspecified atrial fibrillation: Secondary | ICD-10-CM | POA: Diagnosis not present

## 2020-10-29 DIAGNOSIS — I10 Essential (primary) hypertension: Secondary | ICD-10-CM | POA: Diagnosis not present

## 2020-10-29 DIAGNOSIS — J441 Chronic obstructive pulmonary disease with (acute) exacerbation: Secondary | ICD-10-CM | POA: Diagnosis not present

## 2020-10-30 DIAGNOSIS — K5792 Diverticulitis of intestine, part unspecified, without perforation or abscess without bleeding: Secondary | ICD-10-CM | POA: Diagnosis not present

## 2020-10-30 DIAGNOSIS — K582 Mixed irritable bowel syndrome: Secondary | ICD-10-CM | POA: Diagnosis not present

## 2020-10-30 DIAGNOSIS — H9193 Unspecified hearing loss, bilateral: Secondary | ICD-10-CM | POA: Diagnosis not present

## 2020-10-30 DIAGNOSIS — M4800 Spinal stenosis, site unspecified: Secondary | ICD-10-CM | POA: Diagnosis not present

## 2020-10-30 DIAGNOSIS — M8580 Other specified disorders of bone density and structure, unspecified site: Secondary | ICD-10-CM | POA: Diagnosis not present

## 2020-10-30 DIAGNOSIS — M19042 Primary osteoarthritis, left hand: Secondary | ICD-10-CM | POA: Diagnosis not present

## 2020-10-30 DIAGNOSIS — F419 Anxiety disorder, unspecified: Secondary | ICD-10-CM | POA: Diagnosis not present

## 2020-10-30 DIAGNOSIS — M80031D Age-related osteoporosis with current pathological fracture, right forearm, subsequent encounter for fracture with routine healing: Secondary | ICD-10-CM | POA: Diagnosis not present

## 2020-10-30 DIAGNOSIS — K219 Gastro-esophageal reflux disease without esophagitis: Secondary | ICD-10-CM | POA: Diagnosis not present

## 2020-10-30 DIAGNOSIS — J411 Mucopurulent chronic bronchitis: Secondary | ICD-10-CM | POA: Diagnosis not present

## 2020-10-30 DIAGNOSIS — I051 Rheumatic mitral insufficiency: Secondary | ICD-10-CM | POA: Diagnosis not present

## 2020-10-30 DIAGNOSIS — F3341 Major depressive disorder, recurrent, in partial remission: Secondary | ICD-10-CM | POA: Diagnosis not present

## 2020-10-30 DIAGNOSIS — I130 Hypertensive heart and chronic kidney disease with heart failure and stage 1 through stage 4 chronic kidney disease, or unspecified chronic kidney disease: Secondary | ICD-10-CM | POA: Diagnosis not present

## 2020-10-30 DIAGNOSIS — N189 Chronic kidney disease, unspecified: Secondary | ICD-10-CM | POA: Diagnosis not present

## 2020-10-30 DIAGNOSIS — M80041D Age-related osteoporosis with current pathological fracture, right hand, subsequent encounter for fracture with routine healing: Secondary | ICD-10-CM | POA: Diagnosis not present

## 2020-10-30 DIAGNOSIS — E782 Mixed hyperlipidemia: Secondary | ICD-10-CM | POA: Diagnosis not present

## 2020-10-30 DIAGNOSIS — N390 Urinary tract infection, site not specified: Secondary | ICD-10-CM | POA: Diagnosis not present

## 2020-10-30 DIAGNOSIS — I2510 Atherosclerotic heart disease of native coronary artery without angina pectoris: Secondary | ICD-10-CM | POA: Diagnosis not present

## 2020-10-30 DIAGNOSIS — B9620 Unspecified Escherichia coli [E. coli] as the cause of diseases classified elsewhere: Secondary | ICD-10-CM | POA: Diagnosis not present

## 2020-10-30 DIAGNOSIS — I5032 Chronic diastolic (congestive) heart failure: Secondary | ICD-10-CM | POA: Diagnosis not present

## 2020-10-30 DIAGNOSIS — I4891 Unspecified atrial fibrillation: Secondary | ICD-10-CM | POA: Diagnosis not present

## 2020-10-30 DIAGNOSIS — D631 Anemia in chronic kidney disease: Secondary | ICD-10-CM | POA: Diagnosis not present

## 2020-10-30 DIAGNOSIS — M109 Gout, unspecified: Secondary | ICD-10-CM | POA: Diagnosis not present

## 2020-10-30 DIAGNOSIS — R32 Unspecified urinary incontinence: Secondary | ICD-10-CM | POA: Diagnosis not present

## 2020-11-04 DIAGNOSIS — I5032 Chronic diastolic (congestive) heart failure: Secondary | ICD-10-CM | POA: Diagnosis not present

## 2020-11-04 DIAGNOSIS — K582 Mixed irritable bowel syndrome: Secondary | ICD-10-CM | POA: Diagnosis not present

## 2020-11-04 DIAGNOSIS — M19042 Primary osteoarthritis, left hand: Secondary | ICD-10-CM | POA: Diagnosis not present

## 2020-11-04 DIAGNOSIS — K5792 Diverticulitis of intestine, part unspecified, without perforation or abscess without bleeding: Secondary | ICD-10-CM | POA: Diagnosis not present

## 2020-11-04 DIAGNOSIS — B9620 Unspecified Escherichia coli [E. coli] as the cause of diseases classified elsewhere: Secondary | ICD-10-CM | POA: Diagnosis not present

## 2020-11-04 DIAGNOSIS — I051 Rheumatic mitral insufficiency: Secondary | ICD-10-CM | POA: Diagnosis not present

## 2020-11-04 DIAGNOSIS — F419 Anxiety disorder, unspecified: Secondary | ICD-10-CM | POA: Diagnosis not present

## 2020-11-04 DIAGNOSIS — N390 Urinary tract infection, site not specified: Secondary | ICD-10-CM | POA: Diagnosis not present

## 2020-11-04 DIAGNOSIS — J411 Mucopurulent chronic bronchitis: Secondary | ICD-10-CM | POA: Diagnosis not present

## 2020-11-04 DIAGNOSIS — H9193 Unspecified hearing loss, bilateral: Secondary | ICD-10-CM | POA: Diagnosis not present

## 2020-11-04 DIAGNOSIS — I4891 Unspecified atrial fibrillation: Secondary | ICD-10-CM | POA: Diagnosis not present

## 2020-11-04 DIAGNOSIS — K219 Gastro-esophageal reflux disease without esophagitis: Secondary | ICD-10-CM | POA: Diagnosis not present

## 2020-11-04 DIAGNOSIS — N189 Chronic kidney disease, unspecified: Secondary | ICD-10-CM | POA: Diagnosis not present

## 2020-11-04 DIAGNOSIS — M80041D Age-related osteoporosis with current pathological fracture, right hand, subsequent encounter for fracture with routine healing: Secondary | ICD-10-CM | POA: Diagnosis not present

## 2020-11-04 DIAGNOSIS — M4800 Spinal stenosis, site unspecified: Secondary | ICD-10-CM | POA: Diagnosis not present

## 2020-11-04 DIAGNOSIS — F3341 Major depressive disorder, recurrent, in partial remission: Secondary | ICD-10-CM | POA: Diagnosis not present

## 2020-11-04 DIAGNOSIS — R32 Unspecified urinary incontinence: Secondary | ICD-10-CM | POA: Diagnosis not present

## 2020-11-04 DIAGNOSIS — M8580 Other specified disorders of bone density and structure, unspecified site: Secondary | ICD-10-CM | POA: Diagnosis not present

## 2020-11-04 DIAGNOSIS — E782 Mixed hyperlipidemia: Secondary | ICD-10-CM | POA: Diagnosis not present

## 2020-11-04 DIAGNOSIS — M109 Gout, unspecified: Secondary | ICD-10-CM | POA: Diagnosis not present

## 2020-11-04 DIAGNOSIS — M80031D Age-related osteoporosis with current pathological fracture, right forearm, subsequent encounter for fracture with routine healing: Secondary | ICD-10-CM | POA: Diagnosis not present

## 2020-11-04 DIAGNOSIS — I130 Hypertensive heart and chronic kidney disease with heart failure and stage 1 through stage 4 chronic kidney disease, or unspecified chronic kidney disease: Secondary | ICD-10-CM | POA: Diagnosis not present

## 2020-11-04 DIAGNOSIS — I2510 Atherosclerotic heart disease of native coronary artery without angina pectoris: Secondary | ICD-10-CM | POA: Diagnosis not present

## 2020-11-04 DIAGNOSIS — D631 Anemia in chronic kidney disease: Secondary | ICD-10-CM | POA: Diagnosis not present

## 2020-11-12 DIAGNOSIS — N390 Urinary tract infection, site not specified: Secondary | ICD-10-CM | POA: Diagnosis not present

## 2020-11-12 DIAGNOSIS — M19042 Primary osteoarthritis, left hand: Secondary | ICD-10-CM | POA: Diagnosis not present

## 2020-11-12 DIAGNOSIS — N189 Chronic kidney disease, unspecified: Secondary | ICD-10-CM | POA: Diagnosis not present

## 2020-11-12 DIAGNOSIS — B9620 Unspecified Escherichia coli [E. coli] as the cause of diseases classified elsewhere: Secondary | ICD-10-CM | POA: Diagnosis not present

## 2020-11-12 DIAGNOSIS — E782 Mixed hyperlipidemia: Secondary | ICD-10-CM | POA: Diagnosis not present

## 2020-11-12 DIAGNOSIS — J411 Mucopurulent chronic bronchitis: Secondary | ICD-10-CM | POA: Diagnosis not present

## 2020-11-12 DIAGNOSIS — K219 Gastro-esophageal reflux disease without esophagitis: Secondary | ICD-10-CM | POA: Diagnosis not present

## 2020-11-12 DIAGNOSIS — M109 Gout, unspecified: Secondary | ICD-10-CM | POA: Diagnosis not present

## 2020-11-12 DIAGNOSIS — I4891 Unspecified atrial fibrillation: Secondary | ICD-10-CM | POA: Diagnosis not present

## 2020-11-12 DIAGNOSIS — M80041D Age-related osteoporosis with current pathological fracture, right hand, subsequent encounter for fracture with routine healing: Secondary | ICD-10-CM | POA: Diagnosis not present

## 2020-11-12 DIAGNOSIS — R32 Unspecified urinary incontinence: Secondary | ICD-10-CM | POA: Diagnosis not present

## 2020-11-12 DIAGNOSIS — D631 Anemia in chronic kidney disease: Secondary | ICD-10-CM | POA: Diagnosis not present

## 2020-11-12 DIAGNOSIS — I5032 Chronic diastolic (congestive) heart failure: Secondary | ICD-10-CM | POA: Diagnosis not present

## 2020-11-12 DIAGNOSIS — M4800 Spinal stenosis, site unspecified: Secondary | ICD-10-CM | POA: Diagnosis not present

## 2020-11-12 DIAGNOSIS — K5792 Diverticulitis of intestine, part unspecified, without perforation or abscess without bleeding: Secondary | ICD-10-CM | POA: Diagnosis not present

## 2020-11-12 DIAGNOSIS — F3341 Major depressive disorder, recurrent, in partial remission: Secondary | ICD-10-CM | POA: Diagnosis not present

## 2020-11-12 DIAGNOSIS — K582 Mixed irritable bowel syndrome: Secondary | ICD-10-CM | POA: Diagnosis not present

## 2020-11-12 DIAGNOSIS — I130 Hypertensive heart and chronic kidney disease with heart failure and stage 1 through stage 4 chronic kidney disease, or unspecified chronic kidney disease: Secondary | ICD-10-CM | POA: Diagnosis not present

## 2020-11-12 DIAGNOSIS — I051 Rheumatic mitral insufficiency: Secondary | ICD-10-CM | POA: Diagnosis not present

## 2020-11-12 DIAGNOSIS — M80031D Age-related osteoporosis with current pathological fracture, right forearm, subsequent encounter for fracture with routine healing: Secondary | ICD-10-CM | POA: Diagnosis not present

## 2020-11-12 DIAGNOSIS — H9193 Unspecified hearing loss, bilateral: Secondary | ICD-10-CM | POA: Diagnosis not present

## 2020-11-12 DIAGNOSIS — I2510 Atherosclerotic heart disease of native coronary artery without angina pectoris: Secondary | ICD-10-CM | POA: Diagnosis not present

## 2020-11-12 DIAGNOSIS — M8580 Other specified disorders of bone density and structure, unspecified site: Secondary | ICD-10-CM | POA: Diagnosis not present

## 2020-11-12 DIAGNOSIS — F419 Anxiety disorder, unspecified: Secondary | ICD-10-CM | POA: Diagnosis not present

## 2020-11-19 DIAGNOSIS — K5792 Diverticulitis of intestine, part unspecified, without perforation or abscess without bleeding: Secondary | ICD-10-CM | POA: Diagnosis not present

## 2020-11-19 DIAGNOSIS — H9193 Unspecified hearing loss, bilateral: Secondary | ICD-10-CM | POA: Diagnosis not present

## 2020-11-19 DIAGNOSIS — M19042 Primary osteoarthritis, left hand: Secondary | ICD-10-CM | POA: Diagnosis not present

## 2020-11-19 DIAGNOSIS — M8580 Other specified disorders of bone density and structure, unspecified site: Secondary | ICD-10-CM | POA: Diagnosis not present

## 2020-11-19 DIAGNOSIS — F3341 Major depressive disorder, recurrent, in partial remission: Secondary | ICD-10-CM | POA: Diagnosis not present

## 2020-11-19 DIAGNOSIS — M109 Gout, unspecified: Secondary | ICD-10-CM | POA: Diagnosis not present

## 2020-11-19 DIAGNOSIS — I2510 Atherosclerotic heart disease of native coronary artery without angina pectoris: Secondary | ICD-10-CM | POA: Diagnosis not present

## 2020-11-19 DIAGNOSIS — R32 Unspecified urinary incontinence: Secondary | ICD-10-CM | POA: Diagnosis not present

## 2020-11-19 DIAGNOSIS — I5032 Chronic diastolic (congestive) heart failure: Secondary | ICD-10-CM | POA: Diagnosis not present

## 2020-11-19 DIAGNOSIS — J411 Mucopurulent chronic bronchitis: Secondary | ICD-10-CM | POA: Diagnosis not present

## 2020-11-19 DIAGNOSIS — F419 Anxiety disorder, unspecified: Secondary | ICD-10-CM | POA: Diagnosis not present

## 2020-11-19 DIAGNOSIS — N189 Chronic kidney disease, unspecified: Secondary | ICD-10-CM | POA: Diagnosis not present

## 2020-11-19 DIAGNOSIS — N390 Urinary tract infection, site not specified: Secondary | ICD-10-CM | POA: Diagnosis not present

## 2020-11-19 DIAGNOSIS — M80041D Age-related osteoporosis with current pathological fracture, right hand, subsequent encounter for fracture with routine healing: Secondary | ICD-10-CM | POA: Diagnosis not present

## 2020-11-19 DIAGNOSIS — K219 Gastro-esophageal reflux disease without esophagitis: Secondary | ICD-10-CM | POA: Diagnosis not present

## 2020-11-19 DIAGNOSIS — E782 Mixed hyperlipidemia: Secondary | ICD-10-CM | POA: Diagnosis not present

## 2020-11-19 DIAGNOSIS — B9620 Unspecified Escherichia coli [E. coli] as the cause of diseases classified elsewhere: Secondary | ICD-10-CM | POA: Diagnosis not present

## 2020-11-19 DIAGNOSIS — I051 Rheumatic mitral insufficiency: Secondary | ICD-10-CM | POA: Diagnosis not present

## 2020-11-19 DIAGNOSIS — M80031D Age-related osteoporosis with current pathological fracture, right forearm, subsequent encounter for fracture with routine healing: Secondary | ICD-10-CM | POA: Diagnosis not present

## 2020-11-19 DIAGNOSIS — D631 Anemia in chronic kidney disease: Secondary | ICD-10-CM | POA: Diagnosis not present

## 2020-11-19 DIAGNOSIS — M4800 Spinal stenosis, site unspecified: Secondary | ICD-10-CM | POA: Diagnosis not present

## 2020-11-19 DIAGNOSIS — K582 Mixed irritable bowel syndrome: Secondary | ICD-10-CM | POA: Diagnosis not present

## 2020-11-19 DIAGNOSIS — I130 Hypertensive heart and chronic kidney disease with heart failure and stage 1 through stage 4 chronic kidney disease, or unspecified chronic kidney disease: Secondary | ICD-10-CM | POA: Diagnosis not present

## 2020-11-19 DIAGNOSIS — I4891 Unspecified atrial fibrillation: Secondary | ICD-10-CM | POA: Diagnosis not present

## 2020-11-21 ENCOUNTER — Other Ambulatory Visit: Payer: Self-pay

## 2020-11-21 ENCOUNTER — Encounter (HOSPITAL_COMMUNITY): Payer: Self-pay | Admitting: Emergency Medicine

## 2020-11-21 ENCOUNTER — Emergency Department (HOSPITAL_COMMUNITY): Payer: Medicare HMO

## 2020-11-21 ENCOUNTER — Observation Stay (HOSPITAL_COMMUNITY): Payer: Medicare HMO

## 2020-11-21 ENCOUNTER — Inpatient Hospital Stay (HOSPITAL_COMMUNITY)
Admission: EM | Admit: 2020-11-21 | Discharge: 2020-12-01 | DRG: 193 | Disposition: A | Discharge status: Skilled Nursing Facility | Payer: Medicare HMO | Attending: Internal Medicine | Admitting: Internal Medicine

## 2020-11-21 DIAGNOSIS — I5032 Chronic diastolic (congestive) heart failure: Secondary | ICD-10-CM | POA: Diagnosis present

## 2020-11-21 DIAGNOSIS — M25559 Pain in unspecified hip: Secondary | ICD-10-CM | POA: Diagnosis not present

## 2020-11-21 DIAGNOSIS — J45909 Unspecified asthma, uncomplicated: Secondary | ICD-10-CM | POA: Diagnosis present

## 2020-11-21 DIAGNOSIS — Z96652 Presence of left artificial knee joint: Secondary | ICD-10-CM | POA: Diagnosis present

## 2020-11-21 DIAGNOSIS — J9601 Acute respiratory failure with hypoxia: Secondary | ICD-10-CM | POA: Diagnosis present

## 2020-11-21 DIAGNOSIS — G9341 Metabolic encephalopathy: Secondary | ICD-10-CM | POA: Diagnosis present

## 2020-11-21 DIAGNOSIS — M797 Fibromyalgia: Secondary | ICD-10-CM | POA: Diagnosis present

## 2020-11-21 DIAGNOSIS — K219 Gastro-esophageal reflux disease without esophagitis: Secondary | ICD-10-CM | POA: Diagnosis present

## 2020-11-21 DIAGNOSIS — J189 Pneumonia, unspecified organism: Secondary | ICD-10-CM | POA: Diagnosis not present

## 2020-11-21 DIAGNOSIS — R918 Other nonspecific abnormal finding of lung field: Secondary | ICD-10-CM | POA: Diagnosis not present

## 2020-11-21 DIAGNOSIS — N1832 Chronic kidney disease, stage 3b: Secondary | ICD-10-CM | POA: Diagnosis present

## 2020-11-21 DIAGNOSIS — I480 Paroxysmal atrial fibrillation: Secondary | ICD-10-CM | POA: Diagnosis present

## 2020-11-21 DIAGNOSIS — Z7951 Long term (current) use of inhaled steroids: Secondary | ICD-10-CM

## 2020-11-21 DIAGNOSIS — Z880 Allergy status to penicillin: Secondary | ICD-10-CM

## 2020-11-21 DIAGNOSIS — R001 Bradycardia, unspecified: Secondary | ICD-10-CM | POA: Diagnosis not present

## 2020-11-21 DIAGNOSIS — J690 Pneumonitis due to inhalation of food and vomit: Secondary | ICD-10-CM | POA: Diagnosis present

## 2020-11-21 DIAGNOSIS — Z87891 Personal history of nicotine dependence: Secondary | ICD-10-CM

## 2020-11-21 DIAGNOSIS — Z20822 Contact with and (suspected) exposure to covid-19: Secondary | ICD-10-CM | POA: Diagnosis present

## 2020-11-21 DIAGNOSIS — Z882 Allergy status to sulfonamides status: Secondary | ICD-10-CM

## 2020-11-21 DIAGNOSIS — N179 Acute kidney failure, unspecified: Secondary | ICD-10-CM | POA: Diagnosis present

## 2020-11-21 DIAGNOSIS — N183 Chronic kidney disease, stage 3 unspecified: Secondary | ICD-10-CM | POA: Diagnosis present

## 2020-11-21 DIAGNOSIS — F419 Anxiety disorder, unspecified: Secondary | ICD-10-CM | POA: Diagnosis present

## 2020-11-21 DIAGNOSIS — R0902 Hypoxemia: Secondary | ICD-10-CM | POA: Diagnosis not present

## 2020-11-21 DIAGNOSIS — Z90710 Acquired absence of both cervix and uterus: Secondary | ICD-10-CM

## 2020-11-21 DIAGNOSIS — F32A Depression, unspecified: Secondary | ICD-10-CM | POA: Diagnosis present

## 2020-11-21 DIAGNOSIS — Z888 Allergy status to other drugs, medicaments and biological substances status: Secondary | ICD-10-CM

## 2020-11-21 DIAGNOSIS — N1831 Chronic kidney disease, stage 3a: Secondary | ICD-10-CM | POA: Diagnosis present

## 2020-11-21 DIAGNOSIS — R0602 Shortness of breath: Secondary | ICD-10-CM

## 2020-11-21 DIAGNOSIS — Z950 Presence of cardiac pacemaker: Secondary | ICD-10-CM

## 2020-11-21 DIAGNOSIS — M1990 Unspecified osteoarthritis, unspecified site: Secondary | ICD-10-CM | POA: Diagnosis present

## 2020-11-21 DIAGNOSIS — Z9181 History of falling: Secondary | ICD-10-CM

## 2020-11-21 DIAGNOSIS — J168 Pneumonia due to other specified infectious organisms: Secondary | ICD-10-CM | POA: Diagnosis not present

## 2020-11-21 DIAGNOSIS — D509 Iron deficiency anemia, unspecified: Secondary | ICD-10-CM | POA: Diagnosis present

## 2020-11-21 DIAGNOSIS — W19XXXA Unspecified fall, initial encounter: Secondary | ICD-10-CM | POA: Diagnosis not present

## 2020-11-21 DIAGNOSIS — I10 Essential (primary) hypertension: Secondary | ICD-10-CM | POA: Diagnosis present

## 2020-11-21 DIAGNOSIS — Z9049 Acquired absence of other specified parts of digestive tract: Secondary | ICD-10-CM

## 2020-11-21 DIAGNOSIS — Z87440 Personal history of urinary (tract) infections: Secondary | ICD-10-CM

## 2020-11-21 DIAGNOSIS — Z79899 Other long term (current) drug therapy: Secondary | ICD-10-CM

## 2020-11-21 DIAGNOSIS — Z85828 Personal history of other malignant neoplasm of skin: Secondary | ICD-10-CM

## 2020-11-21 DIAGNOSIS — Z8249 Family history of ischemic heart disease and other diseases of the circulatory system: Secondary | ICD-10-CM

## 2020-11-21 DIAGNOSIS — Z825 Family history of asthma and other chronic lower respiratory diseases: Secondary | ICD-10-CM

## 2020-11-21 DIAGNOSIS — R404 Transient alteration of awareness: Secondary | ICD-10-CM | POA: Diagnosis not present

## 2020-11-21 DIAGNOSIS — E7800 Pure hypercholesterolemia, unspecified: Secondary | ICD-10-CM | POA: Diagnosis present

## 2020-11-21 DIAGNOSIS — I4891 Unspecified atrial fibrillation: Secondary | ICD-10-CM | POA: Diagnosis not present

## 2020-11-21 DIAGNOSIS — I130 Hypertensive heart and chronic kidney disease with heart failure and stage 1 through stage 4 chronic kidney disease, or unspecified chronic kidney disease: Secondary | ICD-10-CM | POA: Diagnosis present

## 2020-11-21 DIAGNOSIS — M109 Gout, unspecified: Secondary | ICD-10-CM | POA: Diagnosis present

## 2020-11-21 DIAGNOSIS — Z66 Do not resuscitate: Secondary | ICD-10-CM | POA: Diagnosis present

## 2020-11-21 DIAGNOSIS — I495 Sick sinus syndrome: Secondary | ICD-10-CM | POA: Diagnosis present

## 2020-11-21 DIAGNOSIS — A419 Sepsis, unspecified organism: Secondary | ICD-10-CM | POA: Diagnosis not present

## 2020-11-21 DIAGNOSIS — R6889 Other general symptoms and signs: Secondary | ICD-10-CM | POA: Diagnosis not present

## 2020-11-21 DIAGNOSIS — Z23 Encounter for immunization: Secondary | ICD-10-CM

## 2020-11-21 DIAGNOSIS — R410 Disorientation, unspecified: Secondary | ICD-10-CM | POA: Diagnosis not present

## 2020-11-21 DIAGNOSIS — R4182 Altered mental status, unspecified: Secondary | ICD-10-CM | POA: Diagnosis not present

## 2020-11-21 DIAGNOSIS — E871 Hypo-osmolality and hyponatremia: Secondary | ICD-10-CM | POA: Diagnosis present

## 2020-11-21 DIAGNOSIS — Z7982 Long term (current) use of aspirin: Secondary | ICD-10-CM

## 2020-11-21 DIAGNOSIS — Z743 Need for continuous supervision: Secondary | ICD-10-CM | POA: Diagnosis not present

## 2020-11-21 LAB — COMPREHENSIVE METABOLIC PANEL
ALT: 11 U/L (ref 0–44)
AST: 15 U/L (ref 15–41)
Albumin: 3.6 g/dL (ref 3.5–5.0)
Alkaline Phosphatase: 98 U/L (ref 38–126)
Anion gap: 7 (ref 5–15)
BUN: 20 mg/dL (ref 8–23)
CO2: 24 mmol/L (ref 22–32)
Calcium: 9.2 mg/dL (ref 8.9–10.3)
Chloride: 105 mmol/L (ref 98–111)
Creatinine, Ser: 1.06 mg/dL — ABNORMAL HIGH (ref 0.44–1.00)
GFR, Estimated: 50 mL/min — ABNORMAL LOW (ref >60)
Glucose, Bld: 138 mg/dL — ABNORMAL HIGH (ref 70–99)
Potassium: 3.9 mmol/L (ref 3.5–5.1)
Sodium: 136 mmol/L (ref 135–145)
Total Bilirubin: 1.2 mg/dL (ref 0.3–1.2)
Total Protein: 6 g/dL — ABNORMAL LOW (ref 6.5–8.1)

## 2020-11-21 LAB — CBC WITH DIFFERENTIAL/PLATELET
Abs Immature Granulocytes: 0.06 10*3/uL (ref 0.00–0.07)
Basophils Absolute: 0 10*3/uL (ref 0.0–0.1)
Basophils Relative: 0 %
Eosinophils Absolute: 0 10*3/uL (ref 0.0–0.5)
Eosinophils Relative: 0 %
HCT: 33.4 % — ABNORMAL LOW (ref 36.0–46.0)
Hemoglobin: 11 g/dL — ABNORMAL LOW (ref 12.0–15.0)
Immature Granulocytes: 1 %
Lymphocytes Relative: 8 %
Lymphs Abs: 0.6 10*3/uL — ABNORMAL LOW (ref 0.7–4.0)
MCH: 33 pg (ref 26.0–34.0)
MCHC: 32.9 g/dL (ref 30.0–36.0)
MCV: 100.3 fL — ABNORMAL HIGH (ref 80.0–100.0)
Monocytes Absolute: 0.5 10*3/uL (ref 0.1–1.0)
Monocytes Relative: 7 %
Neutro Abs: 6 10*3/uL (ref 1.7–7.7)
Neutrophils Relative %: 84 %
Platelets: UNDETERMINED 10*3/uL (ref 150–400)
RBC: 3.33 MIL/uL — ABNORMAL LOW (ref 3.87–5.11)
RDW: 13.7 % (ref 11.5–15.5)
WBC: 7.1 10*3/uL (ref 4.0–10.5)
nRBC: 0 % (ref 0.0–0.2)

## 2020-11-21 LAB — RESP PANEL BY RT-PCR (FLU A&B, COVID) ARPGX2
Influenza A by PCR: NEGATIVE
Influenza B by PCR: NEGATIVE
SARS Coronavirus 2 by RT PCR: NEGATIVE

## 2020-11-21 LAB — PROTIME-INR
INR: 1.2 (ref 0.8–1.2)
Prothrombin Time: 14.8 seconds (ref 11.4–15.2)

## 2020-11-21 LAB — I-STAT CHEM 8, ED
BUN: 21 mg/dL (ref 8–23)
Calcium, Ion: 1.24 mmol/L (ref 1.15–1.40)
Chloride: 102 mmol/L (ref 98–111)
Creatinine, Ser: 0.9 mg/dL (ref 0.44–1.00)
Glucose, Bld: 132 mg/dL — ABNORMAL HIGH (ref 70–99)
HCT: 33 % — ABNORMAL LOW (ref 36.0–46.0)
Hemoglobin: 11.2 g/dL — ABNORMAL LOW (ref 12.0–15.0)
Potassium: 3.9 mmol/L (ref 3.5–5.1)
Sodium: 137 mmol/L (ref 135–145)
TCO2: 23 mmol/L (ref 22–32)

## 2020-11-21 LAB — LACTIC ACID, PLASMA: Lactic Acid, Venous: 1 mmol/L (ref 0.5–1.9)

## 2020-11-21 LAB — APTT: aPTT: 33 seconds (ref 24–36)

## 2020-11-21 MED ORDER — AZITHROMYCIN 500 MG IV SOLR
500.0000 mg | INTRAVENOUS | Status: AC
  Administered 2020-11-22 – 2020-11-25 (×4): 500 mg via INTRAVENOUS
  Filled 2020-11-21 (×5): qty 500

## 2020-11-21 MED ORDER — ALLOPURINOL 100 MG PO TABS
100.0000 mg | ORAL_TABLET | Freq: Every day | ORAL | Status: DC
  Administered 2020-11-22 – 2020-12-01 (×10): 100 mg via ORAL
  Filled 2020-11-21 (×10): qty 1

## 2020-11-21 MED ORDER — FLUTICASONE FUROATE-VILANTEROL 100-25 MCG/INH IN AEPB
1.0000 | INHALATION_SPRAY | Freq: Every day | RESPIRATORY_TRACT | Status: DC
  Administered 2020-11-24 – 2020-12-01 (×7): 1 via RESPIRATORY_TRACT
  Filled 2020-11-21: qty 28

## 2020-11-21 MED ORDER — ACETAMINOPHEN 325 MG PO TABS
650.0000 mg | ORAL_TABLET | Freq: Four times a day (QID) | ORAL | Status: DC | PRN
  Administered 2020-11-23 – 2020-11-29 (×3): 650 mg via ORAL
  Filled 2020-11-21 (×3): qty 2

## 2020-11-21 MED ORDER — SERTRALINE HCL 50 MG PO TABS
50.0000 mg | ORAL_TABLET | Freq: Every day | ORAL | Status: DC
  Administered 2020-11-22 – 2020-12-01 (×10): 50 mg via ORAL
  Filled 2020-11-21 (×10): qty 1

## 2020-11-21 MED ORDER — MONTELUKAST SODIUM 10 MG PO TABS
10.0000 mg | ORAL_TABLET | Freq: Every day | ORAL | Status: DC
  Administered 2020-11-22 – 2020-11-30 (×10): 10 mg via ORAL
  Filled 2020-11-21 (×10): qty 1

## 2020-11-21 MED ORDER — ACETAMINOPHEN 650 MG RE SUPP: 650.0000 mg | Freq: Four times a day (QID) | RECTAL | Status: DC | PRN

## 2020-11-21 MED ORDER — SODIUM CHLORIDE 0.9 % IV SOLN
2.0000 g | Freq: Once | INTRAVENOUS | Status: DC
  Administered 2020-11-21: 2 g via INTRAVENOUS
  Filled 2020-11-21: qty 2

## 2020-11-21 MED ORDER — FAMOTIDINE 20 MG PO TABS
20.0000 mg | ORAL_TABLET | Freq: Two times a day (BID) | ORAL | Status: DC
  Administered 2020-11-22 – 2020-11-28 (×14): 20 mg via ORAL
  Filled 2020-11-21 (×14): qty 1

## 2020-11-21 MED ORDER — SODIUM CHLORIDE 0.9 % IV SOLN
500.0000 mg | Freq: Once | INTRAVENOUS | Status: AC
  Administered 2020-11-21: 500 mg via INTRAVENOUS
  Filled 2020-11-21: qty 500

## 2020-11-21 MED ORDER — SODIUM CHLORIDE 0.9 % IV SOLN
1.0000 g | Freq: Once | INTRAVENOUS | Status: AC
  Administered 2020-11-21: 1 g via INTRAVENOUS
  Filled 2020-11-21: qty 10

## 2020-11-21 MED ORDER — SENNOSIDES-DOCUSATE SODIUM 8.6-50 MG PO TABS: 1.0000 | ORAL_TABLET | Freq: Every evening | ORAL | Status: DC | PRN

## 2020-11-21 MED ORDER — ENOXAPARIN SODIUM 30 MG/0.3ML IJ SOSY
30.0000 mg | PREFILLED_SYRINGE | Freq: Every day | INTRAMUSCULAR | Status: DC
  Administered 2020-11-22 – 2020-11-30 (×10): 30 mg via SUBCUTANEOUS
  Filled 2020-11-21 (×10): qty 0.3

## 2020-11-21 MED ORDER — ONDANSETRON HCL 4 MG PO TABS: 4.0000 mg | ORAL_TABLET | Freq: Four times a day (QID) | ORAL | Status: DC | PRN

## 2020-11-21 MED ORDER — METRONIDAZOLE 500 MG/100ML IV SOLN
500.0000 mg | Freq: Once | INTRAVENOUS | Status: DC
  Administered 2020-11-21: 500 mg via INTRAVENOUS
  Filled 2020-11-21 (×2): qty 100

## 2020-11-21 MED ORDER — GUAIFENESIN ER 600 MG PO TB12
600.0000 mg | ORAL_TABLET | Freq: Two times a day (BID) | ORAL | Status: DC | PRN
  Administered 2020-11-23 – 2020-11-26 (×5): 600 mg via ORAL
  Filled 2020-11-21 (×6): qty 1

## 2020-11-21 MED ORDER — SODIUM CHLORIDE 0.9 % IV BOLUS
1000.0000 mL | Freq: Once | INTRAVENOUS | Status: AC
  Administered 2020-11-21: 1000 mL via INTRAVENOUS

## 2020-11-21 MED ORDER — VANCOMYCIN HCL IN DEXTROSE 1-5 GM/200ML-% IV SOLN
1000.0000 mg | Freq: Once | INTRAVENOUS | Status: DC
  Filled 2020-11-21: qty 200

## 2020-11-21 MED ORDER — SODIUM CHLORIDE 0.9 % IV BOLUS (SEPSIS)
1000.0000 mL | Freq: Once | INTRAVENOUS | Status: AC
  Administered 2020-11-21: 1000 mL via INTRAVENOUS

## 2020-11-21 MED ORDER — ONDANSETRON HCL 4 MG/2ML IJ SOLN
4.0000 mg | Freq: Four times a day (QID) | INTRAMUSCULAR | Status: DC | PRN
  Administered 2020-11-22 – 2020-11-29 (×2): 4 mg via INTRAVENOUS
  Filled 2020-11-21 (×2): qty 2

## 2020-11-21 MED ORDER — FERROUS SULFATE 325 (65 FE) MG PO TABS
325.0000 mg | ORAL_TABLET | Freq: Every day | ORAL | Status: DC
  Administered 2020-11-22 – 2020-12-01 (×10): 325 mg via ORAL
  Filled 2020-11-21 (×10): qty 1

## 2020-11-21 MED ORDER — ACETAMINOPHEN 325 MG PO TABS
650.0000 mg | ORAL_TABLET | Freq: Once | ORAL | Status: AC
  Administered 2020-11-21: 650 mg via ORAL
  Filled 2020-11-21: qty 2

## 2020-11-21 MED ORDER — ALBUTEROL SULFATE (2.5 MG/3ML) 0.083% IN NEBU
2.5000 mg | INHALATION_SOLUTION | RESPIRATORY_TRACT | Status: DC | PRN
  Administered 2020-11-22 – 2020-11-25 (×8): 2.5 mg via RESPIRATORY_TRACT
  Filled 2020-11-21 (×8): qty 3

## 2020-11-21 MED ORDER — SODIUM CHLORIDE 0.9 % IV SOLN
2.0000 g | INTRAVENOUS | Status: AC
  Administered 2020-11-22 – 2020-11-25 (×4): 2 g via INTRAVENOUS
  Filled 2020-11-21 (×5): qty 20

## 2020-11-21 NOTE — ED Triage Notes (Signed)
Pt BIB [facility] from home, family reports frequent falls, most recent x 3 days ago, c/o left hip pain. Family reports pt became suddenly confused today, no hx dementia/confusion. Hx UTIs, +fever today.

## 2020-11-21 NOTE — H&P (Signed)
History and Physical    ANESA FRONEK OVZ:[id number] DOB: Apr 26, 1927 DOA: 11/21/2020  PCP: [name], MD  Patient coming from: Home via EMS  I have personally briefly reviewed patient's old medical records in [place]  Chief Complaint: Confusion, fever  HPI: ANIYA [name] is a 87 y.o. female with medical history significant for chronic diastolic CHF (EF 41-28%), paroxysmal atrial fibrillation not on anticoagulation due to low A. fib burden, sinus node dysfunction s/p PPM, CKD stage IIIa, hypertension, asthma who presented to the ED for evaluation of confusion and fever.  Patient reports several days of cough productive of yellow sputum, occasional shortness of breath, chills, and new fever today.  She lives at home with family who reported that she appeared to be newly confused today which is unusual for her.  She denies any chest pain, abdominal pain, nausea, vomiting, dysuria.  ED Course:  Initial vitals showed BP 164/63, pulse 96, RR 22, temp 103 F, SPO2 92% on room air.  Labs show WBC 7.1, hemoglobin 11.0, not able to estimate platelets due to clumps, sodium 136, potassium 3.9, bicarb 24, BUN 20, creatinine 1.06, serum glucose 138.  Blood cultures collected and pending.  SARS-CoV-2 and influenza are negative.  Portable chest x-ray shows right upper lobe pneumonia.  CT head without contrast is negative for acute intracranial abnormality.  Patient was given 2 L normal saline, IV Flagyl, cefepime, ceftriaxone, azithromycin.  The hospitalist service was consulted to admit for further evaluation and management.  Review of Systems: All systems reviewed and are negative except as documented in history of present illness above.   Past Medical History:  Diagnosis Date   Anemia    "I stay anemic"   Arthritis    Asthma    no problems recent   Cataract    Chronic diastolic CHF (congestive heart failure), NYHA class 2 ([place]) 03/19/2014   Echo 4/19: mod LVH,  EF 55-60, no RWMA, Gr 1 DD, MAC, trivial MR, mod LAE, normal RVSF, PASP 19   CKD (chronic kidney disease), stage III (HCC)    Complication of anesthesia    CONFUSED AFTER KNEE SURGERY   Depression    Diverticulitis    DVT (deep venous thrombosis) (HCC)    from birth control, left groin   Essential hypertension    Fibromyalgia 10 y.a.   GERD (gastroesophageal reflux disease)    Gout    Hx of cardiovascular stress test    Lexiscan Myoview (8/15): apical attenuation artifact, no ischemia, EF 69% - low risk.   Hypercholesteremia    Odynophagia    Other abnormal glucose    PAF (paroxysmal atrial fibrillation) (HCC)    Personal history of other diseases of digestive system    Pneumonia    hx of   Presence of permanent cardiac pacemaker    Sinus bradycardia    Skin cancer 2014   skin R elbow & face   Symptomatic bradycardia 06/2016    Past Surgical History:  Procedure Laterality Date   ABDOMINAL HYSTERECTOMY     partial   APPENDECTOMY  ~55years ago   CHOLECYSTECTOMY  10/2005   Dr. Effie Shy   COLONOSCOPY W/ BIOPSIES  2005, 2011   Dr. Bing Plume, "balloon inside for last one at [facility]"   INTRAMEDULLARY (IM) NAIL INTERTROCHANTERIC Left 02/10/2018   Procedure: INTRAMEDULLARY (IM) NAIL INTERTROCHANTRIC;  Surgeon: Rod Can, MD;  Location: [facility];  Service: Orthopedics;  Laterality: Left;   IR THORACENTESIS ASP PLEURAL SPACE W/IMG  GUIDE  06/13/2017   KNEE ARTHROSCOPY Right    LAMINECTOMY  05/2007   foraminotomy, L4-5 laminectomy   LARYNGOSCOPY / BRONCHOSCOPY / ESOPHAGOSCOPY  2010   Dr. Benjamine Mola   PACEMAKER IMPLANT N/A 07/01/2016   Procedure: Pacemaker Implant;  Surgeon: Evans Lance, MD;  Location: [facility];  Service: Cardiovascular;  Laterality: N/A;   SKIN CANCER EXCISION Right ~2005   r elbow   TOTAL KNEE ARTHROPLASTY Left 04/04/2013   Procedure: LEFT TOTAL KNEE ARTHROPLASTY;  Surgeon: Tobi Bastos, MD;  Location: [facility];  Service: Orthopedics;  Laterality:  Left;   TUBAL LIGATION     TUMOR REMOVAL Right ~ 20 years ago   tumor in between toes    Social History:  reports that she quit smoking about 47 years ago. Her smoking use included cigarettes. She has a 10.00 pack-year smoking history. She has never used smokeless tobacco. She reports that she does not drink alcohol and does not use drugs.  Allergies  Allergen Reactions   Amoxicillin Other (See Comments)    Very weak Has patient had a PCN reaction causing immediate rash, facial/tongue/throat swelling, SOB or lightheadedness with hypotension: no Has patient had a PCN reaction causing severe rash involving mucus membranes or skin necrosis: no Has patient had a PCN reaction that required hospitalization: no Has patient had a PCN reaction occurring within the last 10 years: no If all of the above answers are "NO", then may proceed with Cephalosporin use.    Diltiazem    Diltiazem Hcl Other (See Comments)    Reaction unknown   Losartan Potassium Other (See Comments)    Potassium level increased drastically   Potassium-Containing Compounds Other (See Comments)    Pt is very sensitive to potassium products. Her Potassium rises very quickly and takes a long time to come down.   Sulfonamide Derivatives Other (See Comments)    Reaction unknown   Zetia [Ezetimibe] Other (See Comments)    Weakness   Penicillins Rash    DID THE REACTION INVOLVE: Swelling of the face/tongue/throat, SOB, or low BP? Unknown Sudden or severe rash/hives, skin peeling, or the inside of the mouth or nose? Unknown Did it require medical treatment? Unknown When did it last happen?      unknown If all above answers are "NO", may proceed with cephalosporin use.     Family History  Problem Relation Age of Onset   Heart disease Mother    Hypertension Mother    Fainting Mother    Arrhythmia Mother    Diabetes Father    Stroke Father    Hypertension Father    Cancer Brother    Sudden death Brother    Heart  attack Brother    Cancer Sister    Diabetes Sister    Asthma Sister      Prior to Admission medications   Medication Sig Start Date End Date Taking? Authorizing Provider  acetaminophen (TYLENOL) 500 MG tablet Take 2 tablets (1,000 mg total) by mouth every 8 (eight) hours as needed for mild pain or headache. 05/16/17   Hongalgi, Lenis Dickinson, MD  albuterol (2.5 MG/3ML) 0.083% NEBU 3 mL, albuterol (5 MG/ML) 0.5% NEBU 0.5 mL Inhale into the lungs.    [provider]  albuterol (PROVENTIL HFA;VENTOLIN HFA) 108 (90 BASE) MCG/ACT inhaler Inhale 1 puff into the lungs every 6 (six) hours as needed for wheezing or shortness of breath. 11/13/14   Dorothy Spark, MD  alendronate (FOSAMAX) 70 MG  tablet Take 70 mg by mouth once a week. On Friday 02/13/14   [provider]  allopurinol (ZYLOPRIM) 100 MG tablet Take 1 tablet (100 mg total) by mouth daily. 04/01/16   Haney, Yetta Flock A, MD  amLODipine (NORVASC) 2.5 MG tablet Take 1 tablet (2.5 mg total) by mouth daily. Please make overdue appt with Dr. Meda Coffee before anymore refills. 1st attempt 08/09/18   Dorothy Spark, MD  aspirin EC 81 MG tablet Take 81 mg by mouth daily.     [provider]  cholecalciferol (VITAMIN D) 25 MCG (1000 UNIT) tablet Take 1,000 Units by mouth daily.    [provider]  famotidine (PEPCID) 20 MG tablet Take 20 mg by mouth 2 (two) times daily.    [provider]  ferrous sulfate 325 (65 FE) MG tablet Take 325 mg by mouth daily.     [provider]  fluticasone furoate-vilanterol (BREO ELLIPTA) 100-25 MCG/INH AEPB Inhale 1 puff into the lungs daily. 07/31/19   Martyn Ehrich, NP  fluticasone furoate-vilanterol (BREO ELLIPTA) 200-25 MCG/INH AEPB Inhale 1 puff into the lungs daily. 07/31/19   Martyn Ehrich, NP  furosemide (LASIX) 20 MG tablet Take 1 tablet (20 mg total) by mouth 2 (two) times daily. 08/21/19   Dorothy Spark, MD  methocarbamol (ROBAXIN) 500 MG tablet Take 500 mg  by mouth 2 (two) times daily as needed for muscle spasms.    [provider]  montelukast (SINGULAIR) 10 MG tablet Take 1 tablet (10 mg total) by mouth at bedtime. 08/07/20   Chesley Mires, MD  ondansetron (ZOFRAN-ODT) 8 MG disintegrating tablet Take 8 mg by mouth every 8 (eight) hours as needed for nausea or vomiting.  03/26/19   [provider]  rosuvastatin (CRESTOR) 10 MG tablet TAKE ONE TABLET BY MOUTH every morning 07/02/20   Freada Bergeron, MD  sertraline (ZOLOFT) 50 MG tablet Take 50 mg by mouth daily.  03/15/12   [provider]    Physical Exam: Vitals:   11/21/20 1857 11/21/20 2030 11/21/20 2045 11/21/20 2110  BP: (!) 164/63 134/77 (!) 131/58   Pulse: 96 87 81   Resp: (!) _0 Temp: (!) 103 F (39.4 C)   99.2 F (37.3 C)  TempSrc: Oral   Oral  SpO2: 92% 91% 90%    Constitutional: Elderly woman resting supine in bed, appears tired but in NAD, calm, comfortable Eyes: PERRL, lids and conjunctivae normal ENMT: Mucous membranes are dry. Posterior pharynx clear of any exudate or lesions.Normal dentition.  Hard of hearing. Neck: normal, supple, no masses. Respiratory: Rhonchorous breath sounds bilaterally.  Frequent wet cough. Normal respiratory effort. No accessory muscle use.  Cardiovascular: Regular rate and rhythm, no murmurs / rubs / gallops. No extremity edema. 2+ pedal pulses. Abdomen: no tenderness, no masses palpated. No hepatosplenomegaly. Bowel sounds positive.  Musculoskeletal: Cast in place right forearm.  No joint deformity upper and lower extremities. Good ROM, no contractures. Normal muscle tone.  Skin: no rashes, lesions, ulcers. No induration Neurologic: CN 2-12 grossly intact. Sensation intact. Strength 5/5 in all 4.  Psychiatric: Normal judgment and insight. Alert and oriented x 3. Normal mood.   Labs on Admission: I have personally reviewed following labs and imaging studies  CBC: Recent Labs  Lab 11/21/20 1901  11/21/20 1940  WBC 7.1  --   NEUTROABS 6.0  --   HGB 11.0* 11.2*  HCT 33.4* 33.0*  MCV 100.3*  --  PLT PLATELET CLUMPS NOTED ON SMEAR, UNABLE TO ESTIMATE  --    Basic Metabolic Panel: Recent Labs  Lab 11/21/20 1901 11/21/20 1940  NA 136 137  K 3.9 3.9  CL 105 102  CO2 24  --   GLUCOSE 138* 132*  BUN 20 21  CREATININE 1.06* 0.90  CALCIUM 9.2  --    GFR: CrCl cannot be calculated (Unknown ideal weight.). Liver Function Tests: Recent Labs  Lab 11/21/20 1901  AST 15  ALT 11  ALKPHOS 98  BILITOT 1.2  PROT 6.0*  ALBUMIN 3.6   No results for input(s): LIPASE, AMYLASE in the last 168 hours. No results for input(s): AMMONIA in the last 168 hours. Coagulation Profile: Recent Labs  Lab 11/21/20 1901  INR 1.2   Cardiac Enzymes: No results for input(s): CKTOTAL, CKMB, CKMBINDEX, TROPONINI in the last 168 hours. BNP (last 3 results) No results for input(s): PROBNP in the last 8760 hours. HbA1C: No results for input(s): HGBA1C in the last 72 hours. CBG: No results for input(s): GLUCAP in the last 168 hours. Lipid Profile: No results for input(s): CHOL, HDL, LDLCALC, TRIG, CHOLHDL, LDLDIRECT in the last 72 hours. Thyroid Function Tests: No results for input(s): TSH, T4TOTAL, FREET4, T3FREE, THYROIDAB in the last 72 hours. Anemia Panel: No results for input(s): VITAMINB12, FOLATE, FERRITIN, TIBC, IRON, RETICCTPCT in the last 72 hours. Urine analysis:    Component Value Date/Time   COLORURINE YELLOW 08/17/2018 1520   APPEARANCEUR CLEAR 08/17/2018 1520   LABSPEC 1.008 08/17/2018 1520   PHURINE 7.0 08/17/2018 1520   GLUCOSEU NEGATIVE 08/17/2018 1520   HGBUR NEGATIVE 08/17/2018 [place] 08/17/2018 1520   KETONESUR NEGATIVE 08/17/2018 1520   PROTEINUR NEGATIVE 08/17/2018 1520   UROBILINOGEN 0.2 07/20/2013 1457   NITRITE NEGATIVE 08/17/2018 [place] 08/17/2018 1520    Radiological Exams on Admission: DG Chest Port 1  View  Result Date: 11/21/2020 CLINICAL DATA:  Possible sepsis EXAM: PORTABLE CHEST 1 VIEW COMPARISON:  07/07/2019 FINDINGS: Cardiac shadow is enlarged but stable. Pacing device is again seen and stable. Aortic calcifications are noted. Right upper lobe infiltrate is noted consistent with acute pneumonia. Left lung remains clear. No bony abnormality is noted. IMPRESSION: Right upper lobe pneumonia. Electronically Signed   By: Inez Catalina M.D.   On: 11/21/2020 19:37    EKG: Personally reviewed. Normal sinus rhythm with PAC, LAFB.  Not significantly changed when compared to prior.  Assessment/Plan Principal Problem:   Right upper lobe pneumonia Active Problems:   Asthma   Chronic diastolic CHF (congestive heart failure), NYHA class 2 (HCC)   CKD (chronic kidney disease), stage III   Essential hypertension   PAF (paroxysmal atrial fibrillation) (HCC)   KENORA SPAYD is a 87 y.o. female with medical history significant for chronic diastolic CHF (EF 26-83%), paroxysmal atrial fibrillation not on anticoagulation due to low A. fib burden, sinus node dysfunction s/p PPM, CKD stage IIIa, hypertension, asthma who is admitted with right upper lobe pneumonia.  Right upper lobe pneumonia: -Continue IV ceftriaxone and azithromycin -Follow blood cultures, sputum culture, strep pneumonia urinary antigen  Frequent falls at home Recent distal right radial fracture with cast in place: -PT/OT eval  Chronic diastolic CHF: Last EF 41-96%.  Appears euvolemic.  S/p 2 L NS in the ED.  Hold further fluids.  Monitor strict I/O's and give Lasix if needed.  Paroxysmal atrial fibrillation: Not on anticoagulation due to rare A. fib burden per prior cardiology  notes.  Not requiring rate or rhythm controlling medications.  In sinus rhythm on admission.  CKD stage IIIa: Chronic and stable.  Sinus node dysfunction: s/p PPM  Asthma: Continue Breo Ellipta, Singulair, albuterol as needed.  Supplemental oxygen  if needed.  Hypertension: Amlodipine on hold with stable BP.  DVT prophylaxis: Lovenox Code Status: DNR, confirmed with patient on admission Family Communication: Discussed with patient, she has discussed with family Disposition Plan: From home, dispo pending clinical progress Consults called: None Level of care: Med-Surg Admission status:  Status is: Observation  The patient remains OBS appropriate and will d/c before 2 midnights.  Zada Finders MD [facility]  If 7PM-7AM, please contact night-coverage [email or web address]  11/21/2020, 10:56 PM

## 2020-11-21 NOTE — ED Provider Notes (Signed)
[facility] EMERGENCY DEPARTMENT Provider Note   CSN: [id number] Arrival date & time: 11/21/20  1854     History Chief Complaint  Patient presents with   Altered Mental Status    [name] is a 87 y.o. female.   Altered Mental Status Patient presents for fever and altered mental status.  History is provided by her son.  He reports the following: Patient, at baseline, is fully alert and oriented.  She is able to perform most of her tasks of daily living independently.  She is able to ambulate short distances but does suffer from frequent falls.  Starting yesterday and progressing into today, patient has had worsening confusion.  She developed a fever today.  She has a history of UTIs.  She recently suffered a fracture in her right arm, for which she is wearing a cast.  He is unaware of any recent head injury.    Past Medical History:  Diagnosis Date   Anemia    "I stay anemic"   Arthritis    Asthma    no problems recent   Cataract    Chronic diastolic CHF (congestive heart failure), NYHA class 2 ([place]) 03/19/2014   Echo 4/19: mod LVH, EF 55-60, no RWMA, Gr 1 DD, MAC, trivial MR, mod LAE, normal RVSF, PASP 19   CKD (chronic kidney disease), stage III (HCC)    Complication of anesthesia    CONFUSED AFTER KNEE SURGERY   Depression    Diverticulitis    DVT (deep venous thrombosis) (HCC)    from birth control, left groin   Essential hypertension    Fibromyalgia 10 y.a.   GERD (gastroesophageal reflux disease)    Gout    Hx of cardiovascular stress test    Lexiscan Myoview (8/15): apical attenuation artifact, no ischemia, EF 69% - low risk.   Hypercholesteremia    Odynophagia    Other abnormal glucose    PAF (paroxysmal atrial fibrillation) (HCC)    Personal history of other diseases of digestive system    Pneumonia    hx of   Presence of permanent cardiac pacemaker    Sinus bradycardia    Skin cancer 2014   skin R elbow & face   Symptomatic  bradycardia 06/2016    Patient Active Problem List   Diagnosis Date Noted   Acute metabolic encephalopathy 65/46/5035   Right upper lobe pneumonia 11/21/2020   Non-ST elevated myocardial infarction ([place]) 06/27/2019   Hip fracture ([place]) 02/10/2018   Closed comminuted intertrochanteric fracture of left femur ([place]) 02/10/2018   Pacemaker 07/06/2017   Chronic respiratory failure with hypoxia ([place]) 06/09/2017   Pleural effusion associated with pulmonary infection 06/09/2017   Hyperkalemia 06/09/2017   Pleuritic chest pain 06/09/2017   Fever 05/14/2017   Renal insufficiency    Constipation 04/25/2017   Urine finding 04/25/2017   Posterior rhinorrhea 04/25/2017   Acute upper respiratory infection 04/25/2017   Thrombocytopenia ([place]) 01/15/2017   CAP (community acquired pneumonia) 01/15/2017   Symptomatic bradycardia 06/30/2016   Hypertensive heart disease 04/01/2016   Bradycardia 04/01/2016   Fall 04/01/2016   Sinus bradycardia    CKD (chronic kidney disease), stage III    Normochromic normocytic anemia    Essential hypertension    PAF (paroxysmal atrial fibrillation) (HCC)    Palpitations 09/18/2014   Shortness of breath 03/19/2014   Chronic diastolic CHF (congestive heart failure), NYHA class 2 ([place]) 03/19/2014   Fatigue 09/11/2013   Gastroenteritis 07/20/2013   Abdominal pain  07/20/2013   Abnormal x-ray of abdomen 07/20/2013   Diarrhea 07/20/2013   Generalized weakness 07/20/2013   Obesity (BMI 30-39.9) 07/20/2013   Hypercholesteremia    GERD (gastroesophageal reflux disease)    Depression    Asthma    Gout    Acute blood loss anemia 04/05/2013   Osteoarthritis of left knee 04/04/2013   Total knee replacement status 04/04/2013   Pulmonary hypertension ([place]) 03/07/2013   Anemia, unspecified 03/19/2012   Unspecified deficiency anemia 03/19/2012   Dizziness 07/09/2010   Chest pain 07/30/2009   HYPERTENSION, UNSPECIFIED 08/19/2008   PAROXYSMAL ATRIAL FIBRILLATION  08/19/2008   GASTROESOPHAGEAL REFLUX DISEASE, HX OF 08/19/2008    Past Surgical History:  Procedure Laterality Date   ABDOMINAL HYSTERECTOMY     partial   APPENDECTOMY  ~55years ago   CHOLECYSTECTOMY  10/2005   Dr. Effie Shy   COLONOSCOPY W/ BIOPSIES  2005, 2011   Dr. Bing Plume, "balloon inside for last one at [facility]"   INTRAMEDULLARY (IM) NAIL INTERTROCHANTERIC Left 02/10/2018   Procedure: INTRAMEDULLARY (IM) NAIL INTERTROCHANTRIC;  Surgeon: Rod Can, MD;  Location: [facility];  Service: Orthopedics;  Laterality: Left;   IR THORACENTESIS ASP PLEURAL SPACE W/IMG GUIDE  06/13/2017   KNEE ARTHROSCOPY Right    LAMINECTOMY  05/2007   foraminotomy, L4-5 laminectomy   LARYNGOSCOPY / BRONCHOSCOPY / ESOPHAGOSCOPY  2010   Dr. Benjamine Mola   PACEMAKER IMPLANT N/A 07/01/2016   Procedure: Pacemaker Implant;  Surgeon: Evans Lance, MD;  Location: [place] [facility];  Service: Cardiovascular;  Laterality: N/A;   SKIN CANCER EXCISION Right ~2005   r elbow   TOTAL KNEE ARTHROPLASTY Left 04/04/2013   Procedure: LEFT TOTAL KNEE ARTHROPLASTY;  Surgeon: Tobi Bastos, MD;  Location: [facility];  Service: Orthopedics;  Laterality: Left;   TUBAL LIGATION     TUMOR REMOVAL Right ~ 20 years ago   tumor in between toes     OB History   No obstetric history on file.     Family History  Problem Relation Age of Onset   Heart disease Mother    Hypertension Mother    Fainting Mother    Arrhythmia Mother    Diabetes Father    Stroke Father    Hypertension Father    Cancer Brother    Sudden death Brother    Heart attack Brother    Cancer Sister    Diabetes Sister    Asthma Sister     Social History   Tobacco Use   Smoking status: Former    Packs/day: 1.00    Years: 10.00    Pack years: 10.00    Types: Cigarettes    Quit date: 02/08/1973    Years since quitting: 47.8   Smokeless tobacco: Never  Vaping Use   Vaping Use: Never used  Substance Use Topics   Alcohol use: No   Drug use: No     Home Medications Prior to Admission medications   Medication Sig Start Date End Date Taking? Authorizing Provider  acetaminophen (TYLENOL) 500 MG tablet Take 2 tablets (1,000 mg total) by mouth every 8 (eight) hours as needed for mild pain or headache. 05/16/17  Yes Hongalgi, Lenis Dickinson, MD  albuterol (2.5 MG/3ML) 0.083% NEBU 3 mL, albuterol (5 MG/ML) 0.5% NEBU 0.5 mL Inhale 5 mg into the lungs every 6 (six) hours as needed (shortness of breath/wheezing).   Yes [provider]  albuterol (PROVENTIL HFA;VENTOLIN HFA) 108 (90 BASE) MCG/ACT inhaler Inhale 1 puff  into the lungs every 6 (six) hours as needed for wheezing or shortness of breath. 11/13/14  Yes Dorothy Spark, MD  alendronate (FOSAMAX) 70 MG tablet Take 70 mg by mouth every Friday. 02/13/14  Yes [provider]  allopurinol (ZYLOPRIM) 100 MG tablet Take 1 tablet (100 mg total) by mouth daily. 04/01/16  Yes Haney, Alyssa A, MD  amLODipine (NORVASC) 2.5 MG tablet Take 1 tablet (2.5 mg total) by mouth daily. Please make overdue appt with Dr. Meda Coffee before anymore refills. 1st attempt 08/09/18  Yes Dorothy Spark, MD  aspirin EC 81 MG tablet Take 81 mg by mouth daily.    Yes [provider]  famotidine (PEPCID) 20 MG tablet Take 20 mg by mouth 2 (two) times daily.   Yes [provider]  ferrous sulfate 325 (65 FE) MG tablet Take 325 mg by mouth daily.    Yes [provider]  fluticasone furoate-vilanterol (BREO ELLIPTA) 100-25 MCG/INH AEPB Inhale 1 puff into the lungs daily. 07/31/19  Yes Martyn Ehrich, NP  furosemide (LASIX) 20 MG tablet Take 1 tablet (20 mg total) by mouth 2 (two) times daily. Patient taking differently: Take 20 mg by mouth daily as needed for fluid. 08/21/19  Yes Dorothy Spark, MD  methocarbamol (ROBAXIN) 500 MG tablet Take 500 mg by mouth 2 (two) times daily as needed for muscle spasms.   Yes [provider]  montelukast (SINGULAIR) 10 MG tablet Take 1 tablet  (10 mg total) by mouth at bedtime. 08/07/20  Yes Chesley Mires, MD  ondansetron (ZOFRAN-ODT) 8 MG disintegrating tablet Take 8 mg by mouth every 8 (eight) hours as needed for nausea or vomiting.  03/26/19  Yes [provider]  sertraline (ZOLOFT) 50 MG tablet Take 50 mg by mouth daily.  03/15/12  Yes [provider]  traMADol (ULTRAM) 50 MG tablet Take 25 mg by mouth every 6 (six) hours as needed for moderate pain.   Yes [provider]  rosuvastatin (CRESTOR) 10 MG tablet TAKE ONE TABLET BY MOUTH every morning Patient not taking: No sig reported 07/02/20   Freada Bergeron, MD    Allergies    Amoxicillin, Diltiazem, Diltiazem hcl, Losartan potassium, Potassium-containing compounds, Sulfonamide derivatives, Zetia [ezetimibe], and Penicillins  Review of Systems   Review of Systems  Unable to perform ROS: Mental status change   Physical Exam Updated Vital Signs BP (!) 128/48 (BP Location: Left Arm)   Pulse 67   Temp 98.1 F (36.7 C) (Oral)   Resp (!) 21   SpO2 93%   Physical Exam Vitals and nursing note reviewed.  Constitutional:      General: She is not in acute distress.    Appearance: She is well-developed. She is ill-appearing.  HENT:     Head: Normocephalic and atraumatic.     Right Ear: External ear normal.     Left Ear: External ear normal.     Nose: Nose normal.     Mouth/Throat:     Mouth: Mucous membranes are dry.     Pharynx: Oropharynx is clear.  Eyes:     General: No scleral icterus.    Extraocular Movements: Extraocular movements intact.     Conjunctiva/sclera: Conjunctivae normal.  Cardiovascular:     Rate and Rhythm: Normal rate and regular rhythm.     Heart sounds: No murmur heard. Pulmonary:     Effort: Pulmonary effort is normal. Tachypnea present. No respiratory distress.     Breath sounds: No  wheezing or rales.  Abdominal:     Palpations: Abdomen is soft.     Tenderness: There is no abdominal tenderness. There is no right  CVA tenderness, left CVA tenderness or guarding.  Musculoskeletal:        General: No swelling, tenderness or deformity.     Cervical back: Normal range of motion and neck supple. No tenderness.     Right lower leg: No edema.     Left lower leg: No edema.     Comments: Cast is on distal RUE  Skin:    General: Skin is warm and dry.     Coloration: Skin is pale. Skin is not jaundiced.  Neurological:     General: No focal deficit present.     Mental Status: She is disoriented.    ED Results / Procedures / Treatments   Labs (all labs ordered are listed, but only abnormal results are displayed) Labs Reviewed  COMPREHENSIVE METABOLIC PANEL - Abnormal; Notable for the following components:      Result Value   Glucose, Bld 138 (*)    Creatinine, Ser 1.06 (*)    Total Protein 6.0 (*)    GFR, Estimated 50 (*)    All other components within normal limits  CBC WITH DIFFERENTIAL/PLATELET - Abnormal; Notable for the following components:   RBC 3.33 (*)    Hemoglobin 11.0 (*)    HCT 33.4 (*)    MCV 100.3 (*)    Lymphs Abs 0.6 (*)    All other components within normal limits  URINALYSIS, ROUTINE W REFLEX MICROSCOPIC - Abnormal; Notable for the following components:   APPearance HAZY (*)    Hgb urine dipstick SMALL (*)    Protein, ur 100 (*)    Bacteria, UA RARE (*)    All other components within normal limits  BASIC METABOLIC PANEL - Abnormal; Notable for the following components:   Glucose, Bld 106 (*)    Calcium 8.6 (*)    GFR, Estimated 56 (*)    All other components within normal limits  CBC - Abnormal; Notable for the following components:   RBC 3.18 (*)    Hemoglobin 10.6 (*)    HCT 32.5 (*)    MCV 102.2 (*)    Platelets 114 (*)    All other components within normal limits  I-STAT CHEM 8, ED - Abnormal; Notable for the following components:   Glucose, Bld 132 (*)    Hemoglobin 11.2 (*)    HCT 33.0 (*)    All other components within normal limits  RESP PANEL BY RT-PCR  (FLU A&B, COVID) ARPGX2  CULTURE, BLOOD (ROUTINE X 2)  CULTURE, BLOOD (ROUTINE X 2)  URINE CULTURE  EXPECTORATED SPUTUM ASSESSMENT W GRAM STAIN, RFLX TO RESP C  LACTIC ACID, PLASMA  PROTIME-INR  APTT  PROCALCITONIN  STREP PNEUMONIAE URINARY ANTIGEN    EKG EKG Interpretation  Date/Time:  Friday November 21 2020 18:58:04 EDT Ventricular Rate:  97 PR Interval:  162 QRS Duration: 92 QT Interval:  357 QTC Calculation: 454 R Axis:   -67 Text Interpretation: Sinus rhythm Atrial premature complex Left anterior fascicular block RSR' in V1 or V2, right VCD or RVH Probable LVH with secondary repol abnrm When compared with ECG of 06/26/2019, Premature atrial complexes are now present Confirmed by Delora Fuel ([id number]) on 11/22/2020 7:01:32 AM  Radiology CT HEAD WO CONTRAST (5MM)  Result Date: 11/21/2020 CLINICAL DATA:  Mental status change, unknown cause EXAM: CT HEAD WITHOUT CONTRAST TECHNIQUE:  Contiguous axial images were obtained from the base of the skull through the vertex without intravenous contrast. COMPARISON:  CT head 06/20/2010 BRAIN: BRAIN Cerebral ventricle sizes are concordant with the degree of cerebral volume loss. No evidence of large-territorial acute infarction. No parenchymal hemorrhage. No mass lesion. No extra-axial collection. Slightly more conspicuous calcification along the left tentorium noted on prior study. No mass effect or midline shift. No hydrocephalus. Basilar cisterns are patent. Vascular: No hyperdense vessel. Skull: No acute fracture or focal lesion. Sinuses/Orbits: Paranasal sinuses and mastoid air cells are clear. The orbits are unremarkable. Other: None. IMPRESSION: No acute intracranial abnormality. Electronically Signed   By: Iven Finn M.D.   On: 11/21/2020 23:02   DG Chest Port 1 View  Result Date: 11/21/2020 CLINICAL DATA:  Possible sepsis EXAM: PORTABLE CHEST 1 VIEW COMPARISON:  07/07/2019 FINDINGS: Cardiac shadow is enlarged but stable. Pacing  device is again seen and stable. Aortic calcifications are noted. Right upper lobe infiltrate is noted consistent with acute pneumonia. Left lung remains clear. No bony abnormality is noted. IMPRESSION: Right upper lobe pneumonia. Electronically Signed   By: Inez Catalina M.D.   On: 11/21/2020 19:37    Procedures Procedures   Medications Ordered in ED Medications  cefTRIAXone (ROCEPHIN) 2 g in sodium chloride 0.9 % 100 mL IVPB (has no administration in time range)  azithromycin (ZITHROMAX) 500 mg in sodium chloride 0.9 % 250 mL IVPB (has no administration in time range)  enoxaparin (LOVENOX) injection 30 mg (30 mg Subcutaneous Given 11/22/20 0030)  acetaminophen (TYLENOL) tablet 650 mg (has no administration in time range)    Or  acetaminophen (TYLENOL) suppository 650 mg (has no administration in time range)  ondansetron (ZOFRAN) tablet 4 mg ( Oral See Alternative 11/22/20 1301)    Or  ondansetron (ZOFRAN) injection 4 mg (4 mg Intravenous Given 11/22/20 1301)  senna-docusate (Senokot-S) tablet 1 tablet (has no administration in time range)  guaiFENesin (MUCINEX) 12 hr tablet 600 mg (has no administration in time range)  albuterol (PROVENTIL) (2.5 MG/3ML) 0.083% nebulizer solution 2.5 mg (has no administration in time range)  allopurinol (ZYLOPRIM) tablet 100 mg (100 mg Oral Given 11/22/20 1054)  famotidine (PEPCID) tablet 20 mg (20 mg Oral Given 11/22/20 1054)  ferrous sulfate tablet 325 mg (325 mg Oral Given 11/22/20 1054)  fluticasone furoate-vilanterol (BREO ELLIPTA) 100-25 MCG/INH 1 puff (1 puff Inhalation Not Given 11/22/20 1313)  montelukast (SINGULAIR) tablet 10 mg (10 mg Oral Given 11/22/20 0029)  sertraline (ZOLOFT) tablet 50 mg (50 mg Oral Given 11/22/20 1054)  sodium chloride 0.9 % bolus 1,000 mL (0 mLs Intravenous Stopped 11/21/20 2140)  acetaminophen (TYLENOL) tablet 650 mg (650 mg Oral Given 11/21/20 1934)  sodium chloride 0.9 % bolus 1,000 mL (0 mLs Intravenous Stopped  11/21/20 2140)  cefTRIAXone (ROCEPHIN) 1 g in sodium chloride 0.9 % 100 mL IVPB (0 g Intravenous Stopped 11/21/20 2045)  azithromycin (ZITHROMAX) 500 mg in sodium chloride 0.9 % 250 mL IVPB (0 mg Intravenous Stopped 11/21/20 2109)    ED Course  I have reviewed the triage vital signs and the nursing notes.  Pertinent labs & imaging results that were available during my care of the patient were reviewed by me and considered in my medical decision making (see chart for details).    MDM Rules/Calculators/A&P                          Patient presents for fever and altered mental  status.  Symptoms have been progressive since yesterday.  History is provided by the patient's son, who accompanies her at bedside.  Patient, who is normally alert and oriented, is unable to answer any questions upon arrival.  Vital signs are notable for a fever of 103 degrees, moderate hypertension, and tachypnea.  IV fluids and Tylenol given.  Work-up initiated to identify source of infection.  Chest x-ray showed right upper lobe pneumonia.  CAP treatment was initiated.  CT scan of head showed no acute intracranial abnormalities.  Patient's altered mental status appears to be secondary to acute infection.  Patient remained hemodynamically stable in the ED.  She was admitted to medicine for further management.  Final Clinical Impression(s) / ED Diagnoses Final diagnoses:  Pneumonia of right upper lobe due to infectious organism    Rx / DC Orders ED Discharge Orders     None        Godfrey Pick, MD 11/22/20 1400

## 2020-11-21 NOTE — Progress Notes (Signed)
Sepsis tracking by eLINK 

## 2020-11-22 DIAGNOSIS — J45909 Unspecified asthma, uncomplicated: Secondary | ICD-10-CM

## 2020-11-22 DIAGNOSIS — G9341 Metabolic encephalopathy: Secondary | ICD-10-CM | POA: Diagnosis present

## 2020-11-22 DIAGNOSIS — J9601 Acute respiratory failure with hypoxia: Secondary | ICD-10-CM | POA: Diagnosis not present

## 2020-11-22 DIAGNOSIS — Z9181 History of falling: Secondary | ICD-10-CM | POA: Diagnosis not present

## 2020-11-22 DIAGNOSIS — E7800 Pure hypercholesterolemia, unspecified: Secondary | ICD-10-CM | POA: Diagnosis not present

## 2020-11-22 DIAGNOSIS — N1832 Chronic kidney disease, stage 3b: Secondary | ICD-10-CM | POA: Diagnosis not present

## 2020-11-22 DIAGNOSIS — I480 Paroxysmal atrial fibrillation: Secondary | ICD-10-CM | POA: Diagnosis not present

## 2020-11-22 DIAGNOSIS — I517 Cardiomegaly: Secondary | ICD-10-CM | POA: Diagnosis not present

## 2020-11-22 DIAGNOSIS — D509 Iron deficiency anemia, unspecified: Secondary | ICD-10-CM | POA: Diagnosis not present

## 2020-11-22 DIAGNOSIS — R0602 Shortness of breath: Secondary | ICD-10-CM | POA: Diagnosis not present

## 2020-11-22 DIAGNOSIS — J811 Chronic pulmonary edema: Secondary | ICD-10-CM | POA: Diagnosis not present

## 2020-11-22 DIAGNOSIS — I495 Sick sinus syndrome: Secondary | ICD-10-CM | POA: Diagnosis not present

## 2020-11-22 DIAGNOSIS — I130 Hypertensive heart and chronic kidney disease with heart failure and stage 1 through stage 4 chronic kidney disease, or unspecified chronic kidney disease: Secondary | ICD-10-CM | POA: Diagnosis not present

## 2020-11-22 DIAGNOSIS — K219 Gastro-esophageal reflux disease without esophagitis: Secondary | ICD-10-CM | POA: Diagnosis not present

## 2020-11-22 DIAGNOSIS — R059 Cough, unspecified: Secondary | ICD-10-CM | POA: Diagnosis not present

## 2020-11-22 DIAGNOSIS — A419 Sepsis, unspecified organism: Secondary | ICD-10-CM | POA: Diagnosis not present

## 2020-11-22 DIAGNOSIS — Z23 Encounter for immunization: Secondary | ICD-10-CM | POA: Diagnosis not present

## 2020-11-22 DIAGNOSIS — E871 Hypo-osmolality and hyponatremia: Secondary | ICD-10-CM | POA: Diagnosis not present

## 2020-11-22 DIAGNOSIS — Z20822 Contact with and (suspected) exposure to covid-19: Secondary | ICD-10-CM | POA: Diagnosis not present

## 2020-11-22 DIAGNOSIS — R4182 Altered mental status, unspecified: Secondary | ICD-10-CM | POA: Diagnosis not present

## 2020-11-22 DIAGNOSIS — J189 Pneumonia, unspecified organism: Secondary | ICD-10-CM | POA: Diagnosis not present

## 2020-11-22 DIAGNOSIS — N179 Acute kidney failure, unspecified: Secondary | ICD-10-CM | POA: Diagnosis not present

## 2020-11-22 DIAGNOSIS — N1831 Chronic kidney disease, stage 3a: Secondary | ICD-10-CM | POA: Diagnosis not present

## 2020-11-22 DIAGNOSIS — J690 Pneumonitis due to inhalation of food and vomit: Secondary | ICD-10-CM | POA: Diagnosis not present

## 2020-11-22 DIAGNOSIS — F32A Depression, unspecified: Secondary | ICD-10-CM | POA: Diagnosis not present

## 2020-11-22 DIAGNOSIS — I5032 Chronic diastolic (congestive) heart failure: Secondary | ICD-10-CM | POA: Diagnosis not present

## 2020-11-22 DIAGNOSIS — M797 Fibromyalgia: Secondary | ICD-10-CM | POA: Diagnosis not present

## 2020-11-22 DIAGNOSIS — F419 Anxiety disorder, unspecified: Secondary | ICD-10-CM | POA: Diagnosis not present

## 2020-11-22 DIAGNOSIS — Z66 Do not resuscitate: Secondary | ICD-10-CM | POA: Diagnosis not present

## 2020-11-22 DIAGNOSIS — Z85828 Personal history of other malignant neoplasm of skin: Secondary | ICD-10-CM | POA: Diagnosis not present

## 2020-11-22 DIAGNOSIS — I10 Essential (primary) hypertension: Secondary | ICD-10-CM | POA: Diagnosis not present

## 2020-11-22 LAB — BASIC METABOLIC PANEL
Anion gap: 8 (ref 5–15)
BUN: 17 mg/dL (ref 8–23)
CO2: 22 mmol/L (ref 22–32)
Calcium: 8.6 mg/dL — ABNORMAL LOW (ref 8.9–10.3)
Chloride: 108 mmol/L (ref 98–111)
Creatinine, Ser: 0.96 mg/dL (ref 0.44–1.00)
GFR, Estimated: 56 mL/min — ABNORMAL LOW (ref >60)
Glucose, Bld: 106 mg/dL — ABNORMAL HIGH (ref 70–99)
Potassium: 3.7 mmol/L (ref 3.5–5.1)
Sodium: 138 mmol/L (ref 135–145)

## 2020-11-22 LAB — URINALYSIS, ROUTINE W REFLEX MICROSCOPIC
Bilirubin Urine: NEGATIVE
Glucose, UA: NEGATIVE mg/dL
Ketones, ur: NEGATIVE mg/dL
Leukocytes,Ua: NEGATIVE
Nitrite: NEGATIVE
Protein, ur: 100 mg/dL — AB
Specific Gravity, Urine: 1.019 (ref 1.005–1.030)
pH: 6 (ref 5.0–8.0)

## 2020-11-22 LAB — CBC
HCT: 32.5 % — ABNORMAL LOW (ref 36.0–46.0)
Hemoglobin: 10.6 g/dL — ABNORMAL LOW (ref 12.0–15.0)
MCH: 33.3 pg (ref 26.0–34.0)
MCHC: 32.6 g/dL (ref 30.0–36.0)
MCV: 102.2 fL — ABNORMAL HIGH (ref 80.0–100.0)
Platelets: 114 10*3/uL — ABNORMAL LOW (ref 150–400)
RBC: 3.18 MIL/uL — ABNORMAL LOW (ref 3.87–5.11)
RDW: 13.8 % (ref 11.5–15.5)
WBC: 8.4 10*3/uL (ref 4.0–10.5)
nRBC: 0 % (ref 0.0–0.2)

## 2020-11-22 LAB — PROCALCITONIN: Procalcitonin: 3.43 ng/mL

## 2020-11-22 NOTE — Evaluation (Signed)
Physical Therapy Evaluation Patient Details Name: [name] MRN: [id number] DOB: 1928-02-02 Today's Date: 11/22/2020  History of Present Illness  87 yo admitted 10/14 with confusion and fever with PNA. pt with Right wrist cast.  PMHx: CHF, PAF, PPM, CKD, HTN, asthma, gout, L TKA, Left hip fx  Clinical Impression  Pt pleasant but HOH. Pt reports fatigue but able to walk limited distance in room. Pt incontinent of stool on arrival with max assist for pericare and linen change. Pt reports family at home and using RW. Pt with decreased cognition, transfers, gait, safety and history of falls who will benefit from acute therapy to maximize mobility, safety and independence.   Pt on RA at rest with SpO2 94% with drop to 85% during gait and return to 92% at rest end of session       Recommendations for follow up therapy are one component of a multi-disciplinary discharge planning process, led by the attending physician.  Recommendations may be updated based on patient status, additional functional criteria and insurance authorization.  Follow Up Recommendations Home health PT;Supervision for mobility/OOB    Equipment Recommendations  None recommended by PT    Recommendations for Other Services OT consult     Precautions / Restrictions Precautions Precautions: Fall;Other (comment) Precaution Comments: watch sats Required Braces or Orthoses: Splint/Cast Splint/Cast: pt with right wrist cast, no notes in chart Restrictions Other Position/Activity Restrictions: assume NWB Rt wrist      Mobility  Bed Mobility Overal bed mobility: Needs Assistance Bed Mobility: Rolling;Supine to Sit Rolling: Min guard   Supine to sit: Min assist     General bed mobility comments: rolling for pericare with min assist to fully rise to sitting with bed flat    Transfers Overall transfer level: Needs assistance   Transfers: Sit to/from Stand;Stand Pivot Transfers Sit to Stand: Min guard Stand  pivot transfers: Min assist       General transfer comment: cues for hand placement with min assist to direct pivot bed to BSC, standing with RW with guarding for stability  Ambulation/Gait Ambulation/Gait assistance: Min assist Gait Distance (Feet): 25 Feet Assistive device: Rolling walker (2 wheeled) Gait Pattern/deviations: Step-through pattern;Decreased stride length;Trunk flexed   Gait velocity interpretation: 1.31 - 2.62 ft/sec, indicative of limited community ambulator General Gait Details: assist to direct RW at times, pt limited by fatigue with SPO2 dropping to 85% on RA with gait  Stairs            Wheelchair Mobility    Modified Rankin (Stroke Patients Only)       Balance Overall balance assessment: Needs assistance;History of Falls Sitting-balance support: No upper extremity supported;Bilateral upper extremity supported Sitting balance-Leahy Scale: Fair Sitting balance - Comments: EOB and BSC   Standing balance support: Bilateral upper extremity supported Standing balance-Leahy Scale: Poor Standing balance comment: Bil UE on RW for gait                             Pertinent Vitals/Pain Pain Assessment: No/denies pain    Home Living Family/patient expects to be discharged to:: Private residence Living Arrangements: Children Available Help at Discharge: Family;Available 24 hours/day Type of Home: House Home Access: Stairs to enter   [employer] of Steps: 2 Home Layout: One level Home Equipment: Clinical cytogeneticist - 2 wheels;Cane - single point      Prior Function Level of Independence: Independent with assistive device(s)  Comments: pt states she uses a RW, unable to clearly state how much assist she requires at home with hx of falls and currently casted Rt wrist     Hand Dominance        Extremity/Trunk Assessment   Upper Extremity Assessment Upper Extremity Assessment: Generalized weakness;RUE  deficits/detail RUE Deficits / Details: Rt wrist casted    Lower Extremity Assessment Lower Extremity Assessment: Generalized weakness    Cervical / Trunk Assessment Cervical / Trunk Assessment: Kyphotic  Communication   Communication: HOH  Cognition Arousal/Alertness: Awake/alert Behavior During Therapy: WFL for tasks assessed/performed Overall Cognitive Status: No family/caregiver present to determine baseline cognitive functioning Area of Impairment: Orientation;Memory;Problem solving                 Orientation Level: Time           Problem Solving: Slow processing;Requires verbal cues General Comments: pt incontinent of stool on arrival but had not called for assist, pt unable to clearly state PLOF but also very HOH. Pt 2 days off with orientation      General Comments      Exercises     Assessment/Plan    PT Assessment Patient needs continued PT services  PT Problem List Decreased strength;Decreased mobility;Decreased safety awareness;Decreased activity tolerance;Decreased cognition;Cardiopulmonary status limiting activity;Decreased balance;Decreased knowledge of use of DME       PT Treatment Interventions Gait training;Therapeutic exercise;Patient/family education;Stair training;Balance training;Functional mobility training;Neuromuscular re-education;Cognitive remediation;Therapeutic activities;DME instruction    PT Goals (Current goals can be found in the Care Plan section)  Acute Rehab PT Goals Patient Stated Goal: pt agreeable to improving function PT Goal Formulation: With patient Time For Goal Achievement: 12/06/20 Potential to Achieve Goals: Fair    Frequency Min 3X/week   Barriers to discharge        Co-evaluation               AM-PAC PT "6 Clicks" Mobility  Outcome Measure Help needed turning from your back to your side while in a flat bed without using bedrails?: None Help needed moving from lying on your back to sitting on the  side of a flat bed without using bedrails?: A Little Help needed moving to and from a bed to a chair (including a wheelchair)?: A Little Help needed standing up from a chair using your arms (e.g., wheelchair or bedside chair)?: A Little Help needed to walk in hospital room?: A Little Help needed climbing 3-5 steps with a railing? : A Lot 6 Click Score: 18    End of Session   Activity Tolerance: Patient tolerated treatment well Patient left: in chair;with call bell/phone within reach;with chair alarm set Nurse Communication: Mobility status PT Visit Diagnosis: Other abnormalities of gait and mobility (R26.89);History of falling (Z91.81);Muscle weakness (generalized) (M62.81)    Time: 1325-1350 PT Time Calculation (min) (ACUTE ONLY): 25 min   Charges:   PT Evaluation $PT Eval Moderate Complexity: 1 Mod PT Treatments $Gait Training: 8-22 mins        Keelyn Fjelstad P, PT Acute Rehabilitation Services Pager: [phone] Office: [place] 11/22/2020, 2:14 PM

## 2020-11-22 NOTE — ED Notes (Signed)
Breakfast Order Placed ?

## 2020-11-22 NOTE — Progress Notes (Addendum)
PROGRESS NOTE    RETHA BITHER  ZHG:[id number] DOB: 21-Sep-1927 DOA: 11/21/2020 PCP: [name], MD    Brief Narrative:  [name] is a 87 y.o. female with medical history significant for chronic diastolic CHF (EF 19-62%), paroxysmal atrial fibrillation not on anticoagulation due to low A. fib burden, sinus node dysfunction s/p PPM, CKD stage IIIa, hypertension, asthma who presented to the ED for evaluation of confusion and fever. Patient reports several days of cough productive of yellow sputum, occasional shortness of breath, chills, and new fever today.  She lives at home with family who reported that she appeared to be newly confused today which is unusual for her.  She denies any chest pain, abdominal pain, nausea, vomiting, dysuria.   In the ED, BP 164/63, pulse 96, RR 22, temp 103 F, SPO2 92% on room air. WBC 7.1, hemoglobin 11.0, not able to estimate platelets due to clumps, sodium 136, potassium 3.9, bicarb 24, BUN 20, creatinine 1.06, serum glucose 138. SARS-CoV-2 and influenza are negative. Portable chest x-ray shows right upper lobe pneumonia. CT head without contrast is negative for acute intracranial abnormality. Blood cultures collected.  Patient was given 2 L normal saline, IV Flagyl, cefepime, ceftriaxone, azithromycin.  The hospitalist service was consulted to admit for further evaluation and management.   Assessment & Plan:   Principal Problem:   Right upper lobe pneumonia Active Problems:   Asthma   Chronic diastolic CHF (congestive heart failure), NYHA class 2 (HCC)   CKD (chronic kidney disease), stage III   Essential hypertension   PAF (paroxysmal atrial fibrillation) (HCC)   Acute metabolic encephalopathy     Acute metabolic encephalopathy, POA Patient presenting to the ED with confusion, found to have elevated temperature of 103.0 F.  Usually alert and oriented at baseline per family reports.  CT head without contrast with no acute  intracranial abnormality.  Urinalysis unremarkable.  Chest x-ray consistent with right upper lobe pneumonia.  Elevated procalcitonin of 3.43.  Covid-19 PCR and influenza a/B PCR negative.  No significant electrolyte abnormalities.  Continue treatment as below.  Community Aquired/Aspiration Pneumonia, suspect atypical vs gram negative organism Patient presented with progressive shortness of breath, fever 103.0 F, productive cough of yellow sputum. Imaging notable for right upper lobe consolidation.  Procalcitonin elevated 3.43. --Blood cultures x2: Pending --Sputum culture: Pending --Strep urinary antigen: Pending --Azithromycin 500 mg IV q24h --Ceftriaxone 2 g IV q24h --Supportive care --CBC daily, trend procalcitonin   Frequent falls at home Recent distal right radial fracture with cast in place: --PT/OT eval --Outpatient follow-up with [facility]   Chronic diastolic CHF, compensated: Last EF 65-70%.  Appears euvolemic.  S/p 2 L NS in the ED.   -- Hold further IV fluid hydration --Strict I's and O's and daily weights   Paroxysmal atrial fibrillation: Not on anticoagulation due to rare A. fib burden per prior cardiology notes.  Not requiring rate or rhythm controlling medications.  In sinus rhythm on admission.   CKD stage IIIa: Chronic and stable.  Creatinine 0.96.   Sinus node dysfunction: s/p PPM.  Outpatient follow-up with cardiology.   Asthma: Continue Breo Ellipta, Singulair, albuterol as needed.  Supplemental oxygen if needed.  Anxiety/depression: --Sertraline 50 mg p.o. daily  Iron deficiency anemia: Hemoglobin 10.6, stable. --Ferrous sulfate 3 and 25 mg p.o. daily  GERD: Famotidine 20 mg p.o. twice daily   Essential hypertension: BP 126/76, well controlled. --Continue to hold home and limited pain --Monitor BP closely   DVT prophylaxis:  enoxaparin (LOVENOX) injection 30 mg Start: 11/21/20 2345   Code Status: DNR Family Communication: No family  present at bedside this morning.  Attempted to update patient's daughter, [name] via telephone this afternoon, unsuccessful with no answer and voicemail is full.  Disposition Plan:  Level of care: Med-Surg Status is: Inpatient  Remains inpatient appropriate because: Continues with confusion, weakness, debility.  On IV antibiotics, awaiting sputum, blood culture results.  Awaiting PT/OT evaluation.   Consultants:  None  Procedures:  None  Antimicrobials:  Azithromycin 10/14 >> Ceftriaxone 10/14>> Vancomycin 10/14 - 10/14 Cefepime 10/14 - 10/14 Metronidazole 10/14 - 10/14   Subjective: Patient seen examined at bedside, resting comfortably.  Lying in bed.  Pleasantly confused.  No family present at this time.  No specific complaints at this time.  Asked for me to call her daughter [name] for update; unfortunately with no answer and voicemail is full.  Denies headache, no visual changes, no chest pain, no palpitations, no shortness of breath, no abdominal pain.  No acute events overnight per nursing staff.  Objective: Vitals:   11/22/20 0900 11/22/20 0917 11/22/20 1152 11/22/20 1301  BP: (!) 120/52 (!) 119/54 (!) 128/48   Pulse: 66 (!) 59 67   Resp:  19 (!) 21   Temp: 98.3 F (36.8 C) 98.6 F (37 C) 98.1 F (36.7 C)   TempSrc: Oral Oral Oral   SpO2:  91% (!) 89% 93%    Intake/Output Summary (Last 24 hours) at 11/22/2020 1333 Last data filed at 11/22/2020 1207 Gross per 24 hour  Intake 2590 ml  Output --  Net 2590 ml   There were no vitals filed for this visit.  Examination:  General exam: Appears calm and comfortable, pleasantly confused, elderly in appearance Respiratory system: Clear to auscultation. Respiratory effort normal.  On room air with SPO2 92% at rest Cardiovascular system: S1 & S2 heard, RRR. No JVD, murmurs, rubs, gallops or clicks. No pedal edema. Gastrointestinal system: Abdomen is nondistended, soft and nontender. No organomegaly or masses felt. Normal  bowel sounds heard. Central nervous system: Alert, oriented to place [facility]), but not time (1922), or Person (no answer for President). No focal neurological deficits. Extremities: Symmetric 5 x 5 power. Skin: No rashes, lesions or ulcers Psychiatry: Judgement and insight appear poor. Mood & affect appropriate.     Data Reviewed: I have personally reviewed following labs and imaging studies  CBC: Recent Labs  Lab 11/21/20 1901 11/21/20 1940 11/22/20 0159  WBC 7.1  --  8.4  NEUTROABS 6.0  --   --   HGB 11.0* 11.2* 10.6*  HCT 33.4* 33.0* 32.5*  MCV 100.3*  --  102.2*  PLT PLATELET CLUMPS NOTED ON SMEAR, UNABLE TO ESTIMATE  --  621*   Basic Metabolic Panel: Recent Labs  Lab 11/21/20 1901 11/21/20 1940 11/22/20 0159  NA 136 137 138  K 3.9 3.9 3.7  CL 105 102 108  CO2 24  --  22  GLUCOSE 138* 132* 106*  BUN 20 21 17   CREATININE 1.06* 0.90 0.96  CALCIUM 9.2  --  8.6*   GFR: CrCl cannot be calculated (Unknown ideal weight.). Liver Function Tests: Recent Labs  Lab 11/21/20 1901  AST 15  ALT 11  ALKPHOS 98  BILITOT 1.2  PROT 6.0*  ALBUMIN 3.6   No results for input(s): LIPASE, AMYLASE in the last 168 hours. No results for input(s): AMMONIA in the last 168 hours. Coagulation Profile: Recent Labs  Lab 11/21/20 1901  INR 1.2   Cardiac Enzymes: No results for input(s): CKTOTAL, CKMB, CKMBINDEX, TROPONINI in the last 168 hours. BNP (last 3 results) No results for input(s): PROBNP in the last 8760 hours. HbA1C: No results for input(s): HGBA1C in the last 72 hours. CBG: No results for input(s): GLUCAP in the last 168 hours. Lipid Profile: No results for input(s): CHOL, HDL, LDLCALC, TRIG, CHOLHDL, LDLDIRECT in the last 72 hours. Thyroid Function Tests: No results for input(s): TSH, T4TOTAL, FREET4, T3FREE, THYROIDAB in the last 72 hours. Anemia Panel: No results for input(s): VITAMINB12, FOLATE, FERRITIN, TIBC, IRON, RETICCTPCT in the last 72  hours. Sepsis Labs: Recent Labs  Lab 11/21/20 1901 11/22/20 0159  PROCALCITON  --  3.43  LATICACIDVEN 1.0  --     Recent Results (from the past 240 hour(s))  Blood Culture (routine x 2)     Status: None (Preliminary result)   Collection Time: 11/21/20  7:21 PM   Specimen: BLOOD RIGHT ARM  Result Value Ref Range Status   Specimen Description BLOOD RIGHT ARM  Final   Special Requests   Final    BOTTLES DRAWN AEROBIC AND ANAEROBIC Blood Culture adequate volume   Culture   Final    NO GROWTH < 12 HOURS Performed at [facility], [place] [address] [place], [place] [address]    Report Status PENDING  Incomplete  Resp Panel by RT-PCR (Flu A&B, Covid) Nasopharyngeal Swab     Status: None   Collection Time: 11/21/20  7:30 PM   Specimen: Nasopharyngeal Swab; Nasopharyngeal(NP) swabs in vial transport medium  Result Value Ref Range Status   SARS Coronavirus 2 by RT PCR NEGATIVE NEGATIVE Final    Comment: (NOTE) SARS-CoV-2 target nucleic acids are NOT DETECTED.  The SARS-CoV-2 RNA is generally detectable in upper respiratory specimens during the acute phase of infection. The lowest concentration of SARS-CoV-2 viral copies this assay can detect is 138 copies/mL. A negative result does not preclude SARS-Cov-2 infection and should not be used as the sole basis for treatment or other patient management decisions. A negative result may occur with  improper specimen collection/handling, submission of specimen other than nasopharyngeal swab, presence of viral mutation(s) within the areas targeted by this assay, and inadequate number of viral copies(<138 copies/mL). A negative result must be combined with clinical observations, patient history, and epidemiological information. The expected result is Negative.  Fact Sheet for Patients:  EntrepreneurPulse.com.au  Fact Sheet for Healthcare Providers:  IncredibleEmployment.be  This test is no t yet  approved or cleared by the [place] FDA and  has been authorized for detection and/or diagnosis of SARS-CoV-2 by FDA under an Emergency Use Authorization (EUA). This EUA will remain  in effect (meaning this test can be used) for the duration of the COVID-19 declaration under Section 564(b)(1) of the Act, 21 U.S.C.section 360bbb-3(b)(1), unless the authorization is terminated  or revoked sooner.       Influenza A by PCR NEGATIVE NEGATIVE Final   Influenza B by PCR NEGATIVE NEGATIVE Final    Comment: (NOTE) The Xpert Xpress SARS-CoV-2/FLU/RSV plus assay is intended as an aid in the diagnosis of influenza from Nasopharyngeal swab specimens and should not be used as a sole basis for treatment. Nasal washings and aspirates are unacceptable for Xpert Xpress SARS-CoV-2/FLU/RSV testing.  Fact Sheet for Patients: EntrepreneurPulse.com.au  Fact Sheet for Healthcare Providers: IncredibleEmployment.be  This test is not yet approved or cleared by the [place] FDA and has been authorized for detection and/or  diagnosis of SARS-CoV-2 by FDA under an Emergency Use Authorization (EUA). This EUA will remain in effect (meaning this test can be used) for the duration of the COVID-19 declaration under Section 564(b)(1) of the Act, 21 U.S.C. section 360bbb-3(b)(1), unless the authorization is terminated or revoked.  Performed at [facility], [place] [address], [place], [place] [address]   Blood Culture (routine x 2)     Status: None (Preliminary result)   Collection Time: 11/21/20  7:30 PM   Specimen: BLOOD LEFT WRIST  Result Value Ref Range Status   Specimen Description BLOOD LEFT WRIST  Final   Special Requests   Final    BOTTLES DRAWN AEROBIC AND ANAEROBIC Blood Culture results may not be optimal due to an inadequate volume of blood received in culture bottles   Culture   Final    NO GROWTH < 12 HOURS Performed at [facility], [place] [address] [place], [place] [address]    Report Status PENDING  Incomplete         Radiology Studies: CT HEAD WO CONTRAST (5MM)  Result Date: 11/21/2020 CLINICAL DATA:  Mental status change, unknown cause EXAM: CT HEAD WITHOUT CONTRAST TECHNIQUE: Contiguous axial images were obtained from the base of the skull through the vertex without intravenous contrast. COMPARISON:  CT head 06/20/2010 BRAIN: BRAIN Cerebral ventricle sizes are concordant with the degree of cerebral volume loss. No evidence of large-territorial acute infarction. No parenchymal hemorrhage. No mass lesion. No extra-axial collection. Slightly more conspicuous calcification along the left tentorium noted on prior study. No mass effect or midline shift. No hydrocephalus. Basilar cisterns are patent. Vascular: No hyperdense vessel. Skull: No acute fracture or focal lesion. Sinuses/Orbits: Paranasal sinuses and mastoid air cells are clear. The orbits are unremarkable. Other: None. IMPRESSION: No acute intracranial abnormality. Electronically Signed   By: Iven Finn M.D.   On: 11/21/2020 23:02   DG Chest Port 1 View  Result Date: 11/21/2020 CLINICAL DATA:  Possible sepsis EXAM: PORTABLE CHEST 1 VIEW COMPARISON:  07/07/2019 FINDINGS: Cardiac shadow is enlarged but stable. Pacing device is again seen and stable. Aortic calcifications are noted. Right upper lobe infiltrate is noted consistent with acute pneumonia. Left lung remains clear. No bony abnormality is noted. IMPRESSION: Right upper lobe pneumonia. Electronically Signed   By: Inez Catalina M.D.   On: 11/21/2020 19:37        Scheduled Meds:  allopurinol  100 mg Oral Daily   enoxaparin (LOVENOX) injection  30 mg Subcutaneous QHS   famotidine  20 mg Oral BID   ferrous sulfate  325 mg Oral Daily   fluticasone furoate-vilanterol  1 puff Inhalation Daily   montelukast  10 mg Oral QHS   sertraline  50 mg Oral Daily   Continuous Infusions:  azithromycin     cefTRIAXone  (ROCEPHIN)  IV       LOS: 0 days    Time spent: 43 minutes spent on chart review, discussion with nursing staff, consultants, updating family and interview/physical exam; more than 50% of that time was spent in counseling and/or coordination of care.    Vedika Dumlao J [place] DO [facility] Available via Epic secure chat 7am-7pm After these hours, please refer to coverage provider listed on [email or web address] 11/22/2020, 1:33 PM

## 2020-11-23 DIAGNOSIS — G9341 Metabolic encephalopathy: Secondary | ICD-10-CM | POA: Diagnosis not present

## 2020-11-23 DIAGNOSIS — J45909 Unspecified asthma, uncomplicated: Secondary | ICD-10-CM | POA: Diagnosis not present

## 2020-11-23 DIAGNOSIS — J189 Pneumonia, unspecified organism: Secondary | ICD-10-CM | POA: Diagnosis not present

## 2020-11-23 DIAGNOSIS — I5032 Chronic diastolic (congestive) heart failure: Secondary | ICD-10-CM | POA: Diagnosis not present

## 2020-11-23 LAB — BLOOD CULTURE ID PANEL (REFLEXED) - BCID2

## 2020-11-23 LAB — MAGNESIUM: Magnesium: 1.6 mg/dL — ABNORMAL LOW (ref 1.7–2.4)

## 2020-11-23 LAB — BASIC METABOLIC PANEL
Anion gap: 8 (ref 5–15)
BUN: 21 mg/dL (ref 8–23)
CO2: 22 mmol/L (ref 22–32)
Calcium: 8.2 mg/dL — ABNORMAL LOW (ref 8.9–10.3)
Chloride: 106 mmol/L (ref 98–111)
Creatinine, Ser: 1.1 mg/dL — ABNORMAL HIGH (ref 0.44–1.00)
GFR, Estimated: 47 mL/min — ABNORMAL LOW (ref >60)
Glucose, Bld: 115 mg/dL — ABNORMAL HIGH (ref 70–99)
Potassium: 3.6 mmol/L (ref 3.5–5.1)
Sodium: 136 mmol/L (ref 135–145)

## 2020-11-23 LAB — CBC
HCT: 27 % — ABNORMAL LOW (ref 36.0–46.0)
Hemoglobin: 8.7 g/dL — ABNORMAL LOW (ref 12.0–15.0)
MCH: 32.6 pg (ref 26.0–34.0)
MCHC: 32.2 g/dL (ref 30.0–36.0)
MCV: 101.1 fL — ABNORMAL HIGH (ref 80.0–100.0)
Platelets: 114 10*3/uL — ABNORMAL LOW (ref 150–400)
RBC: 2.67 MIL/uL — ABNORMAL LOW (ref 3.87–5.11)
RDW: 13.7 % (ref 11.5–15.5)
WBC: 6 10*3/uL (ref 4.0–10.5)
nRBC: 0 % (ref 0.0–0.2)

## 2020-11-23 LAB — PROCALCITONIN: Procalcitonin: 4.96 ng/mL

## 2020-11-23 LAB — URINE CULTURE: Culture: NO GROWTH

## 2020-11-23 MED ORDER — MELATONIN 3 MG PO TABS
3.0000 mg | ORAL_TABLET | Freq: Every day | ORAL | Status: DC
  Administered 2020-11-23 – 2020-11-30 (×8): 3 mg via ORAL
  Filled 2020-11-23 (×8): qty 1

## 2020-11-23 NOTE — Progress Notes (Signed)
PHARMACY - PHYSICIAN COMMUNICATION CRITICAL VALUE ALERT - BLOOD CULTURE IDENTIFICATION (BCID)  [name] is an 87 y.o. female who presented to [facility] on 11/21/2020 with a chief complaint of CAP  Assessment:  1/4 blood culture bottles with staph species - likely contaminant  Name of physician (or Provider) Contacted: Dr. [place]  Current antibiotics: Rocephin and Azithromycin  Changes to prescribed antibiotics recommended:  No additional abx needed  Results for orders placed or performed during the hospital encounter of 11/21/20  Blood Culture ID Panel (Reflexed) (Collected: 11/21/2020  7:21 PM)  Result Value Ref Range   Enterococcus faecalis NOT DETECTED NOT DETECTED   Enterococcus Faecium NOT DETECTED NOT DETECTED   Listeria monocytogenes NOT DETECTED NOT DETECTED   Staphylococcus species DETECTED (A) NOT DETECTED   Staphylococcus aureus (BCID) NOT DETECTED NOT DETECTED   Staphylococcus epidermidis NOT DETECTED NOT DETECTED   Staphylococcus lugdunensis NOT DETECTED NOT DETECTED   Streptococcus species NOT DETECTED NOT DETECTED   Streptococcus agalactiae NOT DETECTED NOT DETECTED   Streptococcus pneumoniae NOT DETECTED NOT DETECTED   Streptococcus pyogenes NOT DETECTED NOT DETECTED   A.calcoaceticus-baumannii NOT DETECTED NOT DETECTED   Bacteroides fragilis NOT DETECTED NOT DETECTED   Enterobacterales NOT DETECTED NOT DETECTED   Enterobacter cloacae complex NOT DETECTED NOT DETECTED   Escherichia coli NOT DETECTED NOT DETECTED   Klebsiella aerogenes NOT DETECTED NOT DETECTED   Klebsiella oxytoca NOT DETECTED NOT DETECTED   Klebsiella pneumoniae NOT DETECTED NOT DETECTED   Proteus species NOT DETECTED NOT DETECTED   Salmonella species NOT DETECTED NOT DETECTED   Serratia marcescens NOT DETECTED NOT DETECTED   Haemophilus influenzae NOT DETECTED NOT DETECTED   Neisseria meningitidis NOT DETECTED NOT DETECTED   Pseudomonas aeruginosa NOT DETECTED NOT DETECTED    Stenotrophomonas maltophilia NOT DETECTED NOT DETECTED   Candida albicans NOT DETECTED NOT DETECTED   Candida auris NOT DETECTED NOT DETECTED   Candida glabrata NOT DETECTED NOT DETECTED   Candida krusei NOT DETECTED NOT DETECTED   Candida parapsilosis NOT DETECTED NOT DETECTED   Candida tropicalis NOT DETECTED NOT DETECTED   Cryptococcus neoformans/gattii NOT DETECTED NOT DETECTED    Sherlon Handing, PharmD, BCPS Please see amion for complete clinical pharmacist phone list 11/23/2020  7:07 AM

## 2020-11-23 NOTE — Progress Notes (Signed)
PROGRESS NOTE    DELLENE MCGROARTY  ZOX:[id number] DOB: 29-Mar-1927 DOA: 11/21/2020 PCP: [name], MD    Brief Narrative:  [name] is a 87 y.o. female with medical history significant for chronic diastolic CHF (EF 81-19%), paroxysmal atrial fibrillation not on anticoagulation due to low A. fib burden, sinus node dysfunction s/p PPM, CKD stage IIIa, hypertension, asthma who presented to the ED for evaluation of confusion and fever. Patient reports several days of cough productive of yellow sputum, occasional shortness of breath, chills, and new fever today.  She lives at home with family who reported that she appeared to be newly confused today which is unusual for her.  She denies any chest pain, abdominal pain, nausea, vomiting, dysuria.   In the ED, BP 164/63, pulse 96, RR 22, temp 103 F, SPO2 92% on room air. WBC 7.1, hemoglobin 11.0, not able to estimate platelets due to clumps, sodium 136, potassium 3.9, bicarb 24, BUN 20, creatinine 1.06, serum glucose 138. SARS-CoV-2 and influenza are negative. Portable chest x-ray shows right upper lobe pneumonia. CT head without contrast is negative for acute intracranial abnormality. Blood cultures collected.  Patient was given 2 L normal saline, IV Flagyl, cefepime, ceftriaxone, azithromycin.  The hospitalist service was consulted to admit for further evaluation and management.   Assessment & Plan:   Principal Problem:   Right upper lobe pneumonia Active Problems:   Asthma   Chronic diastolic CHF (congestive heart failure), NYHA class 2 (HCC)   CKD (chronic kidney disease), stage III   Essential hypertension   PAF (paroxysmal atrial fibrillation) (HCC)   Acute metabolic encephalopathy     Acute metabolic encephalopathy, POA Patient presenting to the ED with confusion, found to have elevated temperature of 103.0 F.  Usually alert and oriented at baseline per family reports.  CT head without contrast with no acute  intracranial abnormality.  Urinalysis unremarkable and urine culture with no growth.  Chest x-ray consistent with right upper lobe pneumonia.  Elevated procalcitonin of 3.43.  Covid-19 PCR and influenza a/B PCR negative.  No significant electrolyte abnormalities.  Continue treatment as below.  Community Aquired/Aspiration Pneumonia, suspect atypical vs gram negative organism Patient presented with progressive shortness of breath, fever 103.0 F, productive cough of yellow sputum. Imaging notable for right upper lobe consolidation.  Procalcitonin elevated 3.43. --Blood cultures x2: 1/4 positive for staph; likely contaminant  --Azithromycin 500 mg IV q24h --Ceftriaxone 2 g IV q24h --Supportive care --CBC daily   Frequent falls at home Recent distal right radial fracture with cast in place: --PT recommends HH on DC --OT eval pending --Outpatient follow-up with [facility]   Chronic diastolic CHF, compensated: Last EF 65-70%.  Appears euvolemic.  S/p 2 L NS in the ED.   --Hold further IV fluid hydration --Strict I's and O's and daily weights   Paroxysmal atrial fibrillation: Not on anticoagulation due to rare A. fib burden per prior cardiology notes.  Not requiring rate or rhythm controlling medications.  In sinus rhythm on admission.   CKD stage IIIa: Chronic and stable.  Creatinine 1.10.   Sinus node dysfunction: s/p PPM.  Outpatient follow-up with cardiology.   Asthma: Continue Breo Ellipta, Singulair, albuterol as needed.  Supplemental oxygen if needed.  Anxiety/depression: --Sertraline 50 mg p.o. daily  Iron deficiency anemia: Hemoglobin 10.6, stable. --Ferrous sulfate 3 and 25 mg p.o. daily  GERD: Famotidine 20 mg p.o. twice daily   Essential hypertension: BP 126/76, well controlled. --Continue to hold home and limited  pain --Monitor BP closely   DVT prophylaxis: enoxaparin (LOVENOX) injection 30 mg Start: 11/21/20 2345   Code Status: DNR Family  Communication: No family present at bedside this morning, updated patient's daughter, [name] via telephone this afternoon.  Disposition Plan:  Level of care: Med-Surg Status is: Inpatient  Remains inpatient appropriate because: Continues with confusion, weakness, debility.  On IV antibiotics, awaiting sputum, blood culture results.  Awaiting PT/OT evaluation.   Consultants:  None  Procedures:  None  Antimicrobials:  Azithromycin 10/14 >> Ceftriaxone 10/14>> Vancomycin 10/14 - 10/14 Cefepime 10/14 - 10/14 Metronidazole 10/14 - 10/14   Subjective: Patient seen examined at bedside, resting comfortably.  Lying in bed.  Pleasantly confused.  No family present at this time.  No specific complaints at this time.  Denies headache, no visual changes, no chest pain, no palpitations, no shortness of breath, no abdominal pain.  No acute events overnight per nursing staff.  Objective: Vitals:   11/22/20 1648 11/22/20 2054 11/23/20 0423 11/23/20 0749  BP: (!) 127/57 (!) 119/54 (!) 129/53 (!) 127/54  Pulse: 69 81 68 76  Resp: 20 20 19  (!) 21  Temp: 98.3 F (36.8 C) 98.4 F (36.9 C) 99.7 F (37.6 C) 99.5 F (37.5 C)  TempSrc: Oral Oral Oral Oral  SpO2: 96% 93% 98% 92%  Weight:   78.1 kg     Intake/Output Summary (Last 24 hours) at 11/23/2020 1308 Last data filed at 11/23/2020 0915 Gross per 24 hour  Intake 1650 ml  Output 550 ml  Net 1100 ml   Filed Weights   11/23/20 0423  Weight: 78.1 kg    Examination:  General exam: Appears calm and comfortable, pleasantly confused, elderly in appearance Respiratory system: Clear to auscultation. Respiratory effort normal.  On room air with SPO2 92% at rest Cardiovascular system: S1 & S2 heard, RRR. No JVD, murmurs, rubs, gallops or clicks. No pedal edema. Gastrointestinal system: Abdomen is nondistended, soft and nontender. No organomegaly or masses felt. Normal bowel sounds heard. Central nervous system: Alert, oriented to place [facility]), but not time (1922), or Person (no answer for President). No focal neurological deficits. Extremities: Symmetric 5 x 5 power. Skin: No rashes, lesions or ulcers Psychiatry: Judgement and insight appear poor. Mood & affect appropriate.     Data Reviewed: I have personally reviewed following labs and imaging studies  CBC: Recent Labs  Lab 11/21/20 1901 11/21/20 1940 11/22/20 0159 11/23/20 0137  WBC 7.1  --  8.4 6.0  NEUTROABS 6.0  --   --   --   HGB 11.0* 11.2* 10.6* 8.7*  HCT 33.4* 33.0* 32.5* 27.0*  MCV 100.3*  --  102.2* 101.1*  PLT PLATELET CLUMPS NOTED ON SMEAR, UNABLE TO ESTIMATE  --  114* 166*   Basic Metabolic Panel: Recent Labs  Lab 11/21/20 1901 11/21/20 1940 11/22/20 0159 11/23/20 0137  NA 136 137 138 136  K 3.9 3.9 3.7 3.6  CL 105 102 108 106  CO2 24  --  22 22  GLUCOSE 138* 132* 106* 115*  BUN 20 21 17 21   CREATININE 1.06* 0.90 0.96 1.10*  CALCIUM 9.2  --  8.6* 8.2*  MG  --   --   --  1.6*   GFR: CrCl cannot be calculated (Unknown ideal weight.). Liver Function Tests: Recent Labs  Lab 11/21/20 1901  AST 15  ALT 11  ALKPHOS 98  BILITOT 1.2  PROT 6.0*  ALBUMIN 3.6   No results for input(s): LIPASE, AMYLASE  in the last 168 hours. No results for input(s): AMMONIA in the last 168 hours. Coagulation Profile: Recent Labs  Lab 11/21/20 1901  INR 1.2   Cardiac Enzymes: No results for input(s): CKTOTAL, CKMB, CKMBINDEX, TROPONINI in the last 168 hours. BNP (last 3 results) No results for input(s): PROBNP in the last 8760 hours. HbA1C: No results for input(s): HGBA1C in the last 72 hours. CBG: No results for input(s): GLUCAP in the last 168 hours. Lipid Profile: No results for input(s): CHOL, HDL, LDLCALC, TRIG, CHOLHDL, LDLDIRECT in the last 72 hours. Thyroid Function Tests: No results for input(s): TSH, T4TOTAL, FREET4, T3FREE, THYROIDAB in the last 72 hours. Anemia Panel: No results for input(s): VITAMINB12, FOLATE, FERRITIN,  TIBC, IRON, RETICCTPCT in the last 72 hours. Sepsis Labs: Recent Labs  Lab 11/21/20 1901 11/22/20 0159 11/23/20 0137  PROCALCITON  --  3.43 4.96  LATICACIDVEN 1.0  --   --     Recent Results (from the past 240 hour(s))  Blood Culture (routine x 2)     Status: None (Preliminary result)   Collection Time: 11/21/20  7:21 PM   Specimen: BLOOD RIGHT ARM  Result Value Ref Range Status   Specimen Description BLOOD RIGHT ARM  Final   Special Requests   Final    BOTTLES DRAWN AEROBIC AND ANAEROBIC Blood Culture adequate volume   Culture  Setup Time   Final    GRAM POSITIVE COCCI IN CLUSTERS AEROBIC BOTTLE ONLY CRITICAL RESULT CALLED TO, READ BACK BY AND VERIFIED WITH: PHARMD C AMEND AT 5809 11/23/2020 BY L BENFIELD Performed at [facility], [place] [address] [place], [place] [address]    Culture GRAM POSITIVE COCCI  Final   Report Status PENDING  Incomplete  Blood Culture ID Panel (Reflexed)     Status: Abnormal   Collection Time: 11/21/20  7:21 PM  Result Value Ref Range Status   Enterococcus faecalis NOT DETECTED NOT DETECTED Final   Enterococcus Faecium NOT DETECTED NOT DETECTED Final   Listeria monocytogenes NOT DETECTED NOT DETECTED Final   Staphylococcus species DETECTED (A) NOT DETECTED Final    Comment: PHARMD C AMEND AT 2505 11/23/2020 BY L BENFIELD   Staphylococcus aureus (BCID) NOT DETECTED NOT DETECTED Final   Staphylococcus epidermidis NOT DETECTED NOT DETECTED Final   Staphylococcus lugdunensis NOT DETECTED NOT DETECTED Final   Streptococcus species NOT DETECTED NOT DETECTED Final   Streptococcus agalactiae NOT DETECTED NOT DETECTED Final   Streptococcus pneumoniae NOT DETECTED NOT DETECTED Final   Streptococcus pyogenes NOT DETECTED NOT DETECTED Final   A.calcoaceticus-baumannii NOT DETECTED NOT DETECTED Final   Bacteroides fragilis NOT DETECTED NOT DETECTED Final   Enterobacterales NOT DETECTED NOT DETECTED Final   Enterobacter cloacae complex NOT DETECTED NOT  DETECTED Final   Escherichia coli NOT DETECTED NOT DETECTED Final   Klebsiella aerogenes NOT DETECTED NOT DETECTED Final   Klebsiella oxytoca NOT DETECTED NOT DETECTED Final   Klebsiella pneumoniae NOT DETECTED NOT DETECTED Final   Proteus species NOT DETECTED NOT DETECTED Final   Salmonella species NOT DETECTED NOT DETECTED Final   Serratia marcescens NOT DETECTED NOT DETECTED Final   Haemophilus influenzae NOT DETECTED NOT DETECTED Final   Neisseria meningitidis NOT DETECTED NOT DETECTED Final   Pseudomonas aeruginosa NOT DETECTED NOT DETECTED Final   Stenotrophomonas maltophilia NOT DETECTED NOT DETECTED Final   Candida albicans NOT DETECTED NOT DETECTED Final   Candida auris NOT DETECTED NOT DETECTED Final   Candida glabrata NOT DETECTED NOT DETECTED Final  Candida krusei NOT DETECTED NOT DETECTED Final   Candida parapsilosis NOT DETECTED NOT DETECTED Final   Candida tropicalis NOT DETECTED NOT DETECTED Final   Cryptococcus neoformans/gattii NOT DETECTED NOT DETECTED Final    Comment: Performed at [facility], [place] [address] [place], [place] [address]  Resp Panel by RT-PCR (Flu A&B, Covid) Nasopharyngeal Swab     Status: None   Collection Time: 11/21/20  7:30 PM   Specimen: Nasopharyngeal Swab; Nasopharyngeal(NP) swabs in vial transport medium  Result Value Ref Range Status   SARS Coronavirus 2 by RT PCR NEGATIVE NEGATIVE Final    Comment: (NOTE) SARS-CoV-2 target nucleic acids are NOT DETECTED.  The SARS-CoV-2 RNA is generally detectable in upper respiratory specimens during the acute phase of infection. The lowest concentration of SARS-CoV-2 viral copies this assay can detect is 138 copies/mL. A negative result does not preclude SARS-Cov-2 infection and should not be used as the sole basis for treatment or other patient management decisions. A negative result may occur with  improper specimen collection/handling, submission of specimen other than nasopharyngeal swab,  presence of viral mutation(s) within the areas targeted by this assay, and inadequate number of viral copies(<138 copies/mL). A negative result must be combined with clinical observations, patient history, and epidemiological information. The expected result is Negative.  Fact Sheet for Patients:  EntrepreneurPulse.com.au  Fact Sheet for Healthcare Providers:  IncredibleEmployment.be  This test is no t yet approved or cleared by the [place] FDA and  has been authorized for detection and/or diagnosis of SARS-CoV-2 by FDA under an Emergency Use Authorization (EUA). This EUA will remain  in effect (meaning this test can be used) for the duration of the COVID-19 declaration under Section 564(b)(1) of the Act, 21 U.S.C.section 360bbb-3(b)(1), unless the authorization is terminated  or revoked sooner.       Influenza A by PCR NEGATIVE NEGATIVE Final   Influenza B by PCR NEGATIVE NEGATIVE Final    Comment: (NOTE) The Xpert Xpress SARS-CoV-2/FLU/RSV plus assay is intended as an aid in the diagnosis of influenza from Nasopharyngeal swab specimens and should not be used as a sole basis for treatment. Nasal washings and aspirates are unacceptable for Xpert Xpress SARS-CoV-2/FLU/RSV testing.  Fact Sheet for Patients: EntrepreneurPulse.com.au  Fact Sheet for Healthcare Providers: IncredibleEmployment.be  This test is not yet approved or cleared by the [place] FDA and has been authorized for detection and/or diagnosis of SARS-CoV-2 by FDA under an Emergency Use Authorization (EUA). This EUA will remain in effect (meaning this test can be used) for the duration of the COVID-19 declaration under Section 564(b)(1) of the Act, 21 U.S.C. section 360bbb-3(b)(1), unless the authorization is terminated or revoked.  Performed at [facility], [place] [address], [place], [place] [address]   Blood Culture  (routine x 2)     Status: None (Preliminary result)   Collection Time: 11/21/20  7:30 PM   Specimen: BLOOD LEFT WRIST  Result Value Ref Range Status   Specimen Description BLOOD LEFT WRIST  Final   Special Requests   Final    BOTTLES DRAWN AEROBIC AND ANAEROBIC Blood Culture results may not be optimal due to an inadequate volume of blood received in culture bottles   Culture   Final    NO GROWTH 2 DAYS Performed at [facility], [place] [address], [place], [place] [address]    Report Status PENDING  Incomplete  Urine Culture     Status: None   Collection  Time: 11/22/20  7:42 AM   Specimen: In/Out Cath Urine  Result Value Ref Range Status   Specimen Description IN/OUT CATH URINE  Final   Special Requests NONE  Final   Culture   Final    NO GROWTH Performed at [facility], [place] [address] [place], [place] [address]    Report Status 11/23/2020 FINAL  Final         Radiology Studies: CT HEAD WO CONTRAST (5MM)  Result Date: 11/21/2020 CLINICAL DATA:  Mental status change, unknown cause EXAM: CT HEAD WITHOUT CONTRAST TECHNIQUE: Contiguous axial images were obtained from the base of the skull through the vertex without intravenous contrast. COMPARISON:  CT head 06/20/2010 BRAIN: BRAIN Cerebral ventricle sizes are concordant with the degree of cerebral volume loss. No evidence of large-territorial acute infarction. No parenchymal hemorrhage. No mass lesion. No extra-axial collection. Slightly more conspicuous calcification along the left tentorium noted on prior study. No mass effect or midline shift. No hydrocephalus. Basilar cisterns are patent. Vascular: No hyperdense vessel. Skull: No acute fracture or focal lesion. Sinuses/Orbits: Paranasal sinuses and mastoid air cells are clear. The orbits are unremarkable. Other: None. IMPRESSION: No acute intracranial abnormality. Electronically Signed   By: Iven Finn M.D.   On: 11/21/2020 23:02   DG Chest Port 1 View  Result  Date: 11/21/2020 CLINICAL DATA:  Possible sepsis EXAM: PORTABLE CHEST 1 VIEW COMPARISON:  07/07/2019 FINDINGS: Cardiac shadow is enlarged but stable. Pacing device is again seen and stable. Aortic calcifications are noted. Right upper lobe infiltrate is noted consistent with acute pneumonia. Left lung remains clear. No bony abnormality is noted. IMPRESSION: Right upper lobe pneumonia. Electronically Signed   By: Inez Catalina M.D.   On: 11/21/2020 19:37        Scheduled Meds:  allopurinol  100 mg Oral Daily   enoxaparin (LOVENOX) injection  30 mg Subcutaneous QHS   famotidine  20 mg Oral BID   ferrous sulfate  325 mg Oral Daily   fluticasone furoate-vilanterol  1 puff Inhalation Daily   montelukast  10 mg Oral QHS   sertraline  50 mg Oral Daily   Continuous Infusions:  azithromycin 500 mg (11/22/20 2022)   cefTRIAXone (ROCEPHIN)  IV 2 g (11/22/20 2109)     LOS: 1 day    Time spent: 41 minutes spent on chart review, discussion with nursing staff, consultants, updating family and interview/physical exam; more than 50% of that time was spent in counseling and/or coordination of care.    Captola Teschner J [place] DO [facility] Available via Epic secure chat 7am-7pm After these hours, please refer to coverage provider listed on [email or web address] 11/23/2020, 1:08 PM

## 2020-11-23 NOTE — Plan of Care (Signed)
  Problem: Nutrition: Goal: Adequate nutrition will be maintained Outcome: Progressing   

## 2020-11-24 ENCOUNTER — Inpatient Hospital Stay (HOSPITAL_COMMUNITY): Payer: Medicare HMO

## 2020-11-24 DIAGNOSIS — N1832 Chronic kidney disease, stage 3b: Secondary | ICD-10-CM | POA: Diagnosis not present

## 2020-11-24 DIAGNOSIS — I5032 Chronic diastolic (congestive) heart failure: Secondary | ICD-10-CM | POA: Diagnosis not present

## 2020-11-24 DIAGNOSIS — G9341 Metabolic encephalopathy: Secondary | ICD-10-CM | POA: Diagnosis not present

## 2020-11-24 DIAGNOSIS — J189 Pneumonia, unspecified organism: Secondary | ICD-10-CM | POA: Diagnosis not present

## 2020-11-24 LAB — BASIC METABOLIC PANEL
Anion gap: 9 (ref 5–15)
BUN: 20 mg/dL (ref 8–23)
CO2: 23 mmol/L (ref 22–32)
Calcium: 8.4 mg/dL — ABNORMAL LOW (ref 8.9–10.3)
Chloride: 105 mmol/L (ref 98–111)
Creatinine, Ser: 1.09 mg/dL — ABNORMAL HIGH (ref 0.44–1.00)
GFR, Estimated: 48 mL/min — ABNORMAL LOW (ref >60)
Glucose, Bld: 121 mg/dL — ABNORMAL HIGH (ref 70–99)
Potassium: 3.3 mmol/L — ABNORMAL LOW (ref 3.5–5.1)
Sodium: 137 mmol/L (ref 135–145)

## 2020-11-24 LAB — PROCALCITONIN: Procalcitonin: 2.73 ng/mL

## 2020-11-24 LAB — CBC
HCT: 27.2 % — ABNORMAL LOW (ref 36.0–46.0)
Hemoglobin: 8.9 g/dL — ABNORMAL LOW (ref 12.0–15.0)
MCH: 33.1 pg (ref 26.0–34.0)
MCHC: 32.7 g/dL (ref 30.0–36.0)
MCV: 101.1 fL — ABNORMAL HIGH (ref 80.0–100.0)
Platelets: 128 10*3/uL — ABNORMAL LOW (ref 150–400)
RBC: 2.69 MIL/uL — ABNORMAL LOW (ref 3.87–5.11)
RDW: 13.9 % (ref 11.5–15.5)
WBC: 5.9 10*3/uL (ref 4.0–10.5)
nRBC: 0 % (ref 0.0–0.2)

## 2020-11-24 MED ORDER — POTASSIUM CHLORIDE CRYS ER 20 MEQ PO TBCR
20.0000 meq | EXTENDED_RELEASE_TABLET | Freq: Once | ORAL | Status: AC
  Administered 2020-11-24: 20 meq via ORAL
  Filled 2020-11-24: qty 1

## 2020-11-24 MED ORDER — FUROSEMIDE 10 MG/ML IJ SOLN
40.0000 mg | Freq: Two times a day (BID) | INTRAMUSCULAR | Status: DC
  Administered 2020-11-24 – 2020-11-25 (×2): 40 mg via INTRAVENOUS
  Filled 2020-11-24 (×2): qty 4

## 2020-11-24 NOTE — Progress Notes (Signed)
PROGRESS NOTE    [name]  UUV:[id number] DOB: 1927-04-27 DOA: 11/21/2020 PCP: [name], MD    Brief Narrative:  [name] is a 87 y.o. female with medical history significant for chronic diastolic CHF (EF 47-42%), paroxysmal atrial fibrillation not on anticoagulation due to low A. fib burden, sinus node dysfunction s/p PPM, CKD stage IIIa, hypertension, asthma who presented to the ED for evaluation of confusion and fever. Patient reports several days of cough productive of yellow sputum, occasional shortness of breath, chills, and new fever today.  She lives at home with family who reported that she appeared to be newly confused today which is unusual for her.  She denies any chest pain, abdominal pain, nausea, vomiting, dysuria.   In the ED, BP 164/63, pulse 96, RR 22, temp 103 F, SPO2 92% on room air. WBC 7.1, hemoglobin 11.0, not able to estimate platelets due to clumps, sodium 136, potassium 3.9, bicarb 24, BUN 20, creatinine 1.06, serum glucose 138. SARS-CoV-2 and influenza are negative. Portable chest x-ray shows right upper lobe pneumonia. CT head without contrast is negative for acute intracranial abnormality. Blood cultures collected.  Patient was given 2 L normal saline, IV Flagyl, cefepime, ceftriaxone, azithromycin.  The hospitalist service was consulted to admit for further evaluation and management.   Assessment & Plan:   Principal Problem:   Right upper lobe pneumonia Active Problems:   Asthma   Chronic diastolic CHF (congestive heart failure), NYHA class 2 (HCC)   CKD (chronic kidney disease), stage III   Essential hypertension   PAF (paroxysmal atrial fibrillation) (HCC)   Acute metabolic encephalopathy     Acute metabolic encephalopathy, POA Patient presenting to the ED with confusion, found to have elevated temperature of 103.0 F.  Usually alert and oriented at baseline per family reports.  CT head without contrast with no acute  intracranial abnormality.  Urinalysis unremarkable and urine culture with no growth.  Chest x-ray consistent with right upper lobe pneumonia.  Elevated procalcitonin of 3.43.  Covid-19 PCR and influenza a/B PCR negative.  No significant electrolyte abnormalities.  Continue treatment as below.  Community Aquired/Aspiration Pneumonia, suspect atypical vs gram negative organism Patient presented with progressive shortness of breath, fever 103.0 F, productive cough of yellow sputum. Imaging notable for right upper lobe consolidation.  Procalcitonin elevated 3.43. --Blood cultures x2: 1/4 positive for staph; likely contaminant  --Azithromycin 500 mg IV q24h --Ceftriaxone 2 g IV q24h --Supportive care --ambulatory O2 screen today --CBC daily   Frequent falls at home Recent distal right radial fracture with cast in place: --PT/OT recommends HH on DC --Outpatient follow-up with [facility]   Chronic diastolic CHF, compensated: Last EF 65-70%.  S/p 2 L NS in the ED.   --net positive 653mL last 24h and net positive 4.6L with mult unmeasured urinary occurences  --Wt 78.1>77.6kg --Lasix 40mg  IV q12h for slight wheezing/crackles on exam today --Strict I's and O's and daily weights   Paroxysmal atrial fibrillation: Not on anticoagulation due to rare A. fib burden per prior cardiology notes.  Not requiring rate or rhythm controlling medications.  In sinus rhythm on admission.   CKD stage IIIa: Chronic and stable.  Creatinine 1.09.   Sinus node dysfunction: s/p PPM.  Outpatient follow-up with cardiology.   Asthma: Continue Breo Ellipta, Singulair, albuterol as needed.  Supplemental oxygen if needed.  Anxiety/depression: --Sertraline 50 mg p.o. daily  Iron deficiency anemia: Hemoglobin 10.6, stable. --Ferrous sulfate 3 and 25 mg p.o. daily  GERD:  Famotidine 20 mg p.o. twice daily   Essential hypertension: BP 133/53, well controlled. --Continue to hold home amlodipine 2.5 mg daily  tablet for now --Monitor BP closely   DVT prophylaxis: enoxaparin (LOVENOX) injection 30 mg Start: 11/21/20 2345   Code Status: DNR Family Communication: No family present at bedside this morning, attempted to update patient's daughter [name] via telephone this afternoon, unsuccessful with mailbox full.  Disposition Plan:  Level of care: Med-Surg Status is: Inpatient  Remains inpatient appropriate because: Continues with confusion, weakness, debility.  On IV antibiotics, awaiting sputum, blood culture results.  Awaiting PT/OT evaluation.   Consultants:  None  Procedures:  None  Antimicrobials:  Azithromycin 10/14 >> Ceftriaxone 10/14>> Vancomycin 10/14 - 10/14 Cefepime 10/14 - 10/14 Metronidazole 10/14 - 10/14   Subjective: Patient seen examined at bedside, resting comfortably.  Lying in bed.  No family present at this time.  No specific complaints at this time.  Denies headache, no visual changes, no chest pain, no palpitations, no shortness of breath, no abdominal pain.  No acute events overnight per nursing staff.  Objective: Vitals:   11/23/20 2003 11/24/20 0356 11/24/20 0716 11/24/20 1158  BP: (!) 130/57 (!) 133/53  (!) 135/52  Pulse: 76 80 76 80  Resp: 20 18 20 19   Temp: 99.2 F (37.3 C) 99.8 F (37.7 C)  100 F (37.8 C)  TempSrc: Oral Oral  Oral  SpO2: 97% 94% 92% 95%  Weight:  77.6 kg      Intake/Output Summary (Last 24 hours) at 11/24/2020 1356 Last data filed at 11/24/2020 1751 Gross per 24 hour  Intake 1309.88 ml  Output 500 ml  Net 809.88 ml   Filed Weights   11/23/20 0423 11/24/20 0356  Weight: 78.1 kg 77.6 kg    Examination:  General exam: Appears calm and comfortable, pleasantly confused, elderly in appearance Respiratory system: Clear to auscultation. Respiratory effort normal.  On room air with SPO2 92% at rest Cardiovascular system: S1 & S2 heard, RRR. No JVD, murmurs, rubs, gallops or clicks. No pedal edema. Gastrointestinal system:  Abdomen is nondistended, soft and nontender. No organomegaly or masses felt. Normal bowel sounds heard. Central nervous system: Alert, oriented to place [facility]), and person Airline pilot) but not time [phone]). No focal neurological deficits. Extremities: Symmetric 5 x 5 power. Skin: No rashes, lesions or ulcers Psychiatry: Judgement and insight appear poor. Mood & affect appropriate.     Data Reviewed: I have personally reviewed following labs and imaging studies  CBC: Recent Labs  Lab 11/21/20 1901 11/21/20 1940 11/22/20 0159 11/23/20 0137 11/24/20 0226  WBC 7.1  --  8.4 6.0 5.9  NEUTROABS 6.0  --   --   --   --   HGB 11.0* 11.2* 10.6* 8.7* 8.9*  HCT 33.4* 33.0* 32.5* 27.0* 27.2*  MCV 100.3*  --  102.2* 101.1* 101.1*  PLT PLATELET CLUMPS NOTED ON SMEAR, UNABLE TO ESTIMATE  --  114* 114* 527*   Basic Metabolic Panel: Recent Labs  Lab 11/21/20 1901 11/21/20 1940 11/22/20 0159 11/23/20 0137 11/24/20 0226  NA 136 137 138 136 137  K 3.9 3.9 3.7 3.6 3.3*  CL 105 102 108 106 105  CO2 24  --  22 22 23   GLUCOSE 138* 132* 106* 115* 121*  BUN 20 21 17 21 20   CREATININE 1.06* 0.90 0.96 1.10* 1.09*  CALCIUM 9.2  --  8.6* 8.2* 8.4*  MG  --   --   --  1.6*  --  GFR: CrCl cannot be calculated (Unknown ideal weight.). Liver Function Tests: Recent Labs  Lab 11/21/20 1901  AST 15  ALT 11  ALKPHOS 98  BILITOT 1.2  PROT 6.0*  ALBUMIN 3.6   No results for input(s): LIPASE, AMYLASE in the last 168 hours. No results for input(s): AMMONIA in the last 168 hours. Coagulation Profile: Recent Labs  Lab 11/21/20 1901  INR 1.2   Cardiac Enzymes: No results for input(s): CKTOTAL, CKMB, CKMBINDEX, TROPONINI in the last 168 hours. BNP (last 3 results) No results for input(s): PROBNP in the last 8760 hours. HbA1C: No results for input(s): HGBA1C in the last 72 hours. CBG: No results for input(s): GLUCAP in the last 168 hours. Lipid Profile: No results for input(s):  CHOL, HDL, LDLCALC, TRIG, CHOLHDL, LDLDIRECT in the last 72 hours. Thyroid Function Tests: No results for input(s): TSH, T4TOTAL, FREET4, T3FREE, THYROIDAB in the last 72 hours. Anemia Panel: No results for input(s): VITAMINB12, FOLATE, FERRITIN, TIBC, IRON, RETICCTPCT in the last 72 hours. Sepsis Labs: Recent Labs  Lab 11/21/20 1901 11/22/20 0159 11/23/20 0137 11/24/20 0226  PROCALCITON  --  3.43 4.96 2.73  LATICACIDVEN 1.0  --   --   --     Recent Results (from the past 240 hour(s))  Blood Culture (routine x 2)     Status: Abnormal (Preliminary result)   Collection Time: 11/21/20  7:21 PM   Specimen: BLOOD RIGHT ARM  Result Value Ref Range Status   Specimen Description BLOOD RIGHT ARM  Final   Special Requests   Final    BOTTLES DRAWN AEROBIC AND ANAEROBIC Blood Culture adequate volume   Culture  Setup Time   Final    GRAM POSITIVE COCCI IN CLUSTERS AEROBIC BOTTLE ONLY CRITICAL RESULT CALLED TO, READ BACK BY AND VERIFIED WITH: PHARMD C AMEND AT 0867 11/23/2020 BY L BENFIELD    Culture (A)  Final    STAPHYLOCOCCUS HOMINIS CULTURE REINCUBATED FOR BETTER GROWTH Performed at [facility], [place] [address], [place], [place] [address]    Report Status PENDING  Incomplete  Blood Culture ID Panel (Reflexed)     Status: Abnormal   Collection Time: 11/21/20  7:21 PM  Result Value Ref Range Status   Enterococcus faecalis NOT DETECTED NOT DETECTED Final   Enterococcus Faecium NOT DETECTED NOT DETECTED Final   Listeria monocytogenes NOT DETECTED NOT DETECTED Final   Staphylococcus species DETECTED (A) NOT DETECTED Final    Comment: PHARMD C AMEND AT 9326 11/23/2020 BY L BENFIELD   Staphylococcus aureus (BCID) NOT DETECTED NOT DETECTED Final   Staphylococcus epidermidis NOT DETECTED NOT DETECTED Final   Staphylococcus lugdunensis NOT DETECTED NOT DETECTED Final   Streptococcus species NOT DETECTED NOT DETECTED Final   Streptococcus agalactiae NOT DETECTED NOT DETECTED Final    Streptococcus pneumoniae NOT DETECTED NOT DETECTED Final   Streptococcus pyogenes NOT DETECTED NOT DETECTED Final   A.calcoaceticus-baumannii NOT DETECTED NOT DETECTED Final   Bacteroides fragilis NOT DETECTED NOT DETECTED Final   Enterobacterales NOT DETECTED NOT DETECTED Final   Enterobacter cloacae complex NOT DETECTED NOT DETECTED Final   Escherichia coli NOT DETECTED NOT DETECTED Final   Klebsiella aerogenes NOT DETECTED NOT DETECTED Final   Klebsiella oxytoca NOT DETECTED NOT DETECTED Final   Klebsiella pneumoniae NOT DETECTED NOT DETECTED Final   Proteus species NOT DETECTED NOT DETECTED Final   Salmonella species NOT DETECTED NOT DETECTED Final   Serratia marcescens NOT DETECTED NOT DETECTED Final   Haemophilus influenzae NOT  DETECTED NOT DETECTED Final   Neisseria meningitidis NOT DETECTED NOT DETECTED Final   Pseudomonas aeruginosa NOT DETECTED NOT DETECTED Final   Stenotrophomonas maltophilia NOT DETECTED NOT DETECTED Final   Candida albicans NOT DETECTED NOT DETECTED Final   Candida auris NOT DETECTED NOT DETECTED Final   Candida glabrata NOT DETECTED NOT DETECTED Final   Candida krusei NOT DETECTED NOT DETECTED Final   Candida parapsilosis NOT DETECTED NOT DETECTED Final   Candida tropicalis NOT DETECTED NOT DETECTED Final   Cryptococcus neoformans/gattii NOT DETECTED NOT DETECTED Final    Comment: Performed at [facility], [place] [address], [place], [place] [address]  Resp Panel by RT-PCR (Flu A&B, Covid) Nasopharyngeal Swab     Status: None   Collection Time: 11/21/20  7:30 PM   Specimen: Nasopharyngeal Swab; Nasopharyngeal(NP) swabs in vial transport medium  Result Value Ref Range Status   SARS Coronavirus 2 by RT PCR NEGATIVE NEGATIVE Final    Comment: (NOTE) SARS-CoV-2 target nucleic acids are NOT DETECTED.  The SARS-CoV-2 RNA is generally detectable in upper respiratory specimens during the acute phase of infection. The lowest concentration of SARS-CoV-2  viral copies this assay can detect is 138 copies/mL. A negative result does not preclude SARS-Cov-2 infection and should not be used as the sole basis for treatment or other patient management decisions. A negative result may occur with  improper specimen collection/handling, submission of specimen other than nasopharyngeal swab, presence of viral mutation(s) within the areas targeted by this assay, and inadequate number of viral copies(<138 copies/mL). A negative result must be combined with clinical observations, patient history, and epidemiological information. The expected result is Negative.  Fact Sheet for Patients:  EntrepreneurPulse.com.au  Fact Sheet for Healthcare Providers:  IncredibleEmployment.be  This test is no t yet approved or cleared by the [place] FDA and  has been authorized for detection and/or diagnosis of SARS-CoV-2 by FDA under an Emergency Use Authorization (EUA). This EUA will remain  in effect (meaning this test can be used) for the duration of the COVID-19 declaration under Section 564(b)(1) of the Act, 21 U.S.C.section 360bbb-3(b)(1), unless the authorization is terminated  or revoked sooner.       Influenza A by PCR NEGATIVE NEGATIVE Final   Influenza B by PCR NEGATIVE NEGATIVE Final    Comment: (NOTE) The Xpert Xpress SARS-CoV-2/FLU/RSV plus assay is intended as an aid in the diagnosis of influenza from Nasopharyngeal swab specimens and should not be used as a sole basis for treatment. Nasal washings and aspirates are unacceptable for Xpert Xpress SARS-CoV-2/FLU/RSV testing.  Fact Sheet for Patients: EntrepreneurPulse.com.au  Fact Sheet for Healthcare Providers: IncredibleEmployment.be  This test is not yet approved or cleared by the [place] FDA and has been authorized for detection and/or diagnosis of SARS-CoV-2 by FDA under an Emergency Use Authorization  (EUA). This EUA will remain in effect (meaning this test can be used) for the duration of the COVID-19 declaration under Section 564(b)(1) of the Act, 21 U.S.C. section 360bbb-3(b)(1), unless the authorization is terminated or revoked.  Performed at [facility], [place] [address], [place], [place] [address]   Blood Culture (routine x 2)     Status: None (Preliminary result)   Collection Time: 11/21/20  7:30 PM   Specimen: BLOOD LEFT WRIST  Result Value Ref Range Status   Specimen Description BLOOD LEFT WRIST  Final   Special Requests   Final    BOTTLES DRAWN AEROBIC AND ANAEROBIC Blood Culture results may  not be optimal due to an inadequate volume of blood received in culture bottles   Culture   Final    NO GROWTH 3 DAYS Performed at [facility], [place] [address], [place], [place] [address]    Report Status PENDING  Incomplete  Urine Culture     Status: None   Collection Time: 11/22/20  7:42 AM   Specimen: In/Out Cath Urine  Result Value Ref Range Status   Specimen Description IN/OUT CATH URINE  Final   Special Requests NONE  Final   Culture   Final    NO GROWTH Performed at [facility], [place] [address] [place], [place] [address]    Report Status 11/23/2020 FINAL  Final         Radiology Studies: DG CHEST PORT 1 VIEW  Result Date: 11/24/2020 CLINICAL DATA:  Shortness of breath, cough, congestion EXAM: PORTABLE CHEST 1 VIEW COMPARISON:  11/21/2020 FINDINGS: Persistent right lung opacities with improvement in aeration. Pulmonary vascular congestion. Cardiomegaly. No significant pleural effusion. Left chest wall dual lead pacemaker. IMPRESSION: Persistent right lung opacities likely reflecting pneumonia. Some improvement in aeration since the prior study. Cardiomegaly and pulmonary vascular congestion. Electronically Signed   By: Macy Mis M.D.   On: 11/24/2020 08:35        Scheduled Meds:  allopurinol  100 mg Oral Daily   enoxaparin (LOVENOX)  injection  30 mg Subcutaneous QHS   famotidine  20 mg Oral BID   ferrous sulfate  325 mg Oral Daily   fluticasone furoate-vilanterol  1 puff Inhalation Daily   furosemide  40 mg Intravenous BID   melatonin  3 mg Oral QHS   montelukast  10 mg Oral QHS   sertraline  50 mg Oral Daily   Continuous Infusions:  azithromycin 500 mg (11/23/20 1951)   cefTRIAXone (ROCEPHIN)  IV 2 g (11/23/20 2100)     LOS: 2 days    Time spent: 39 minutes spent on chart review, discussion with nursing staff, consultants, updating family and interview/physical exam; more than 50% of that time was spent in counseling and/or coordination of care.    Jaikob Borgwardt J [place] DO [facility] Available via Epic secure chat 7am-7pm After these hours, please refer to coverage provider listed on [email or web address] 11/24/2020, 1:56 PM

## 2020-11-24 NOTE — Evaluation (Signed)
Occupational Therapy Evaluation Patient Details Name: [name] MRN: [id number] DOB: 10-16-1927 Today's Date: 11/24/2020   History of Present Illness 87 yo admitted 10/14 with confusion and fever with PNA. pt with Right wrist cast.  PMHx: CHF, PAF, PPM, CKD, HTN, asthma, gout, L TKA, Left hip fx   Clinical Impression   PTA, pt was living with her daughter and other family; was performing ADLs and using a RW prior to recent fall. Pt currently requiring Min A for UB ADLs, Mod-Max A for LB ADLs, and Min Guard-Min A for functional mobility. Pt presenting with decreased balance, strength, activity tolerance, and cognition. SpO2 93-91% on RA. Pt would benefit from further acute OT to facilitate safe dc. Recommend dc to home with [facility] RN, [place] aide, and HHOT for further OT to optimize safety, independence with ADLs, and return to PLOF.      Recommendations for follow up therapy are one component of a multi-disciplinary discharge planning process, led by the attending physician.  Recommendations may be updated based on patient status, additional functional criteria and insurance authorization.   Follow Up Recommendations  Home health OT;Supervision/Assistance - 24 hour [facility] RN and [facility])    Equipment Recommendations  3 in 1 bedside commode;Hospital bed    Recommendations for Other Services PT consult (Palliative consult)     Precautions / Restrictions Precautions Precautions: Fall Precaution Comments: Watch SpO2 Required Braces or Orthoses: Splint/Cast Splint/Cast: right wrist cast - granddaughter reports that pt has had this for ~6 weeks now. Restrictions Other Position/Activity Restrictions: Assumed NWB for R wrist      Mobility Bed Mobility Overal bed mobility: Needs Assistance Bed Mobility: Supine to Sit     Supine to sit: Min guard;HOB elevated     General bed mobility comments: Min Guard A for safety    Transfers Overall transfer level: Needs assistance Equipment  used: 1 person hand held assist Transfers: Sit to/from [facility] Sit to Stand: Min guard Stand pivot transfers: Min assist       General transfer comment: Min A for maintaining balance to pivot to recliner. Min Guard A for safety with sit<>stand    Balance Overall balance assessment: Needs assistance;History of Falls Sitting-balance support: No upper extremity supported;Feet supported Sitting balance-Leahy Scale: Fair     Standing balance support: Single extremity supported;No upper extremity supported;During functional activity Standing balance-Leahy Scale: Poor Standing balance comment: Reliant on atleast one UE for support                           ADL either performed or assessed with clinical judgement   ADL Overall ADL's : Needs assistance/impaired Eating/Feeding: Set up;Sitting   Grooming: Wash/dry hands;Set up;Supervision/safety;Sitting   Upper Body Bathing: Minimal assistance;Sitting   Lower Body Bathing: Moderate assistance;Sit to/from stand   Upper Body Dressing : Minimal assistance;Sitting   Lower Body Dressing: Maximal assistance;Sit to/from stand Lower Body Dressing Details (indicate cue type and reason): don socks Toilet Transfer: Minimal assistance;Stand-pivot;BSC Toilet Transfer Details (indicate cue type and reason): Min A for balance during stand pivot   Toileting - Clothing Manipulation Details (indicate cue type and reason): Pt performing peri care but limited by BSC depth. Requiring assistance for OT to complete task     Functional mobility during ADLs: Minimal assistance (few steps to recliner) General ADL Comments: Pt presenting with decreased strength, cognition, and activity tolerance     Vision Baseline Vision/History: 1 Wears glasses Patient Visual Report:  No change from baseline       Perception     Praxis      Pertinent Vitals/Pain Pain Assessment: No/denies pain     Hand Dominance Right    Extremity/Trunk Assessment Upper Extremity Assessment Upper Extremity Assessment: RUE deficits/detail RUE Deficits / Details: Fall at home. R wrist cast. Granddaughter reports she has had for ~6 weeks   Lower Extremity Assessment Lower Extremity Assessment: Generalized weakness   Cervical / Trunk Assessment Cervical / Trunk Assessment: Kyphotic   Communication Communication Communication: HOH   Cognition Arousal/Alertness: Awake/alert Behavior During Therapy: WFL for tasks assessed/performed Overall Cognitive Status: Impaired/Different from baseline Area of Impairment: Following commands;Safety/judgement;Problem solving                       Following Commands: Follows one step commands with increased time Safety/Judgement: Decreased awareness of deficits   Problem Solving: Slow processing;Requires verbal cues General Comments: Pt with tangiental conversations. Requiring increased time for following commands. Granddaughter reporting pt will be oriented and then start talking about something not related to conversation or situation   General Comments  SpO2 93-91% on RA; one episode of 86% but then quickly returned to 91%. SpO2 98% on 2L.    Exercises Exercises: General Lower Extremity General Exercises - Lower Extremity Long Arc Quad: AROM;Both;10 reps;Seated   Shoulder Instructions      Home Living Family/patient expects to be discharged to:: Private residence Living Arrangements: Children Available Help at Discharge: Family;Available 24 hours/day Type of Home: House Home Access: Stairs to enter [employer] of Steps: 2   Home Layout: One level     Bathroom Shower/Tub: Occupational psychologist: Standard     Home Equipment: Clinical cytogeneticist - 2 wheels;Cane - single point          Prior Functioning/Environment Level of Independence: Independent with assistive device(s)        Comments: Pt was using RW. Reports she performed  ADLs. Fell while sweeping.        OT Problem List: Decreased range of motion;Decreased activity tolerance;Impaired balance (sitting and/or standing);Decreased knowledge of use of DME or AE;Decreased knowledge of precautions      OT Treatment/Interventions: Self-care/ADL training;Therapeutic exercise;Energy conservation;DME and/or AE instruction;Therapeutic activities;Patient/family education    OT Goals(Current goals can be found in the care plan section) Acute Rehab OT Goals Patient Stated Goal: "I'd like to stop coughing so bad." OT Goal Formulation: With patient Time For Goal Achievement: 12/08/20 Potential to Achieve Goals: Good  OT Frequency: Min 3X/week   Barriers to D/C:            Co-evaluation              AM-PAC OT "6 Clicks" Daily Activity     Outcome Measure Help from another person eating meals?: None Help from another person taking care of personal grooming?: A Little Help from another person toileting, which includes using toliet, bedpan, or urinal?: A Little Help from another person bathing (including washing, rinsing, drying)?: A Lot Help from another person to put on and taking off regular upper body clothing?: A Little Help from another person to put on and taking off regular lower body clothing?: A Lot 6 Click Score: 17   End of Session Equipment Utilized During Treatment: Gait belt Nurse Communication: Mobility status  Activity Tolerance: Patient tolerated treatment well Patient left: in chair;with call bell/phone within reach;with chair alarm set;with family/visitor present  OT Visit Diagnosis: Unsteadiness  on feet (R26.81);Other abnormalities of gait and mobility (R26.89);Muscle weakness (generalized) (M62.81)                Time: 2010-0712 OT Time Calculation (min): 40 min Charges:  OT General Charges $OT Visit: 1 Visit OT Evaluation $OT Eval Moderate Complexity: 1 Mod OT Treatments $Self Care/Home Management : 23-37 mins  Huber Mathers MSOT, OTR/L Acute Rehab Pager: [phone] Office: [place] 11/24/2020, 3:47 PM

## 2020-11-24 NOTE — Care Management (Signed)
    Durable Medical Equipment  (From admission, onward)           Start     Ordered   11/24/20 1557  For home use only DME 3 n 1  Once        11/24/20 1556   11/24/20 1551  For home use only DME Hospital bed  Once       Question Answer Comment  Length of Need Lifetime   Patient has (list medical condition): Right upper lobe pneumonia   The above medical condition requires: Patient requires the ability to reposition frequently   Head must be elevated greater than: 45 degrees   Bed type Semi-electric   Support Surface: Gel Overlay      11/24/20 1550

## 2020-11-24 NOTE — TOC Initial Note (Addendum)
Transition of Care [facility]) - Initial/Assessment Note    Patient Details  Name: [name] MRN: [id number] Date of Birth: Mar 10, 1927  Transition [facility]) CM/SW Contact:    Marilu Favre, RN Phone Number: 11/24/2020, 11:07 AM  Clinical Narrative:                  Spoke to patient and grand daughter [name]  at bedside.    Patient from home. Per grand daughter there are 7 relatives in the home. Patient was active with [facility] prior to admission and would like to continue services.    Stacie with [facility] confirmed and will need resumption of care orders, NCM entered and messaged MD   1545 Suzanne's sister Gregary Signs at bedside. Gregary Signs requesting hospital bed for home and aide. Gregary Signs does not live with her grand mother. Gregary Signs is going to have a family meeting tonight to see if patient should go home with [facility] and hospital bed or to SNF. Gregary Signs understands PT recommendation is for [facility] and insurance would need to approve SNF.   Stacie with [facility] aware and if patient goes home at discharge, [place] can add HHRN,PT,OT,aide and SW.  NCM attempted to call patient's daughter [name] however no answer wand voicemail box is full. Gregary Signs stated [name] unable to answer phone due to work.   Will enter orders for hospital bed   Expected Discharge Plan: [place] Barriers to Discharge: Continued Medical Work up   Patient Goals and CMS Choice Patient states their goals for this hospitalization and ongoing recovery are:: to go home CMS Medicare.gov Compare Post Acute Care list provided to:: Patient Choice offered to / list presented to : Patient (grand daughter)  Expected Discharge Plan and Services Expected Discharge Plan: [place]   Discharge Planning Services: CM Consult Post Acute Care Choice: [place] arrangements for the past 2 months: Single Family Home                 DME Arranged: N/A         HH Arranged: PT, RN [place]  Agency: [place] Date [place]: 11/24/20 Time HH Agency Contacted: 79 Representative spoke with at [place]: Lanetta Inch,  Prior Living Arrangements/Services Living arrangements for the past 2 months: [place] with:: Relatives Patient language and need for interpreter reviewed:: Yes        Need for Family Participation in Patient Care: Yes (Comment) Care giver support system in place?: Yes (comment) Current home services: DME Criminal Activity/Legal Involvement Pertinent to Current Situation/Hospitalization: No - Comment as needed  Activities of Daily Living      Permission Sought/Granted   Permission granted to share information with : Yes, Verbal Permission Granted  Share Information with NAME: Grand daughter [name]  Permission granted to share info w AGENCY: [facility]        Emotional Assessment Appearance:: Appears stated age Attitude/Demeanor/Rapport: Engaged Affect (typically observed): Accepting Orientation: : Oriented to Self, Oriented to Place Alcohol / Substance Use: Not Applicable Psych Involvement: No (comment)  Admission diagnosis:  Right upper lobe pneumonia [I45.8] Acute metabolic encephalopathy [K99.83] Patient Active Problem List   Diagnosis Date Noted   Acute metabolic encephalopathy 38/25/0539   Right upper lobe pneumonia 11/21/2020   Non-ST elevated myocardial infarction ([place]) 06/27/2019   Hip fracture ([place]) 02/10/2018   Closed comminuted intertrochanteric fracture of left femur ([place]) 02/10/2018   Pacemaker 07/06/2017   Chronic respiratory failure with  hypoxia ([place]) 06/09/2017   Pleural effusion associated with pulmonary infection 06/09/2017   Hyperkalemia 06/09/2017   Pleuritic chest pain 06/09/2017   Fever 05/14/2017   Renal insufficiency    Constipation 04/25/2017   Urine finding 04/25/2017   Posterior rhinorrhea 04/25/2017   Acute upper respiratory infection 04/25/2017   Thrombocytopenia ([place])  01/15/2017   CAP (community acquired pneumonia) 01/15/2017   Symptomatic bradycardia 06/30/2016   Hypertensive heart disease 04/01/2016   Bradycardia 04/01/2016   Fall 04/01/2016   Sinus bradycardia    CKD (chronic kidney disease), stage III    Normochromic normocytic anemia    Essential hypertension    PAF (paroxysmal atrial fibrillation) (HCC)    Palpitations 09/18/2014   Shortness of breath 03/19/2014   Chronic diastolic CHF (congestive heart failure), NYHA class 2 ([place]) 03/19/2014   Fatigue 09/11/2013   Gastroenteritis 07/20/2013   Abdominal pain 07/20/2013   Abnormal x-ray of abdomen 07/20/2013   Diarrhea 07/20/2013   Generalized weakness 07/20/2013   Obesity (BMI 30-39.9) 07/20/2013   Hypercholesteremia    GERD (gastroesophageal reflux disease)    Depression    Asthma    Gout    Acute blood loss anemia 04/05/2013   Osteoarthritis of left knee 04/04/2013   Total knee replacement status 04/04/2013   Pulmonary hypertension ([place]) 03/07/2013   Anemia, unspecified 03/19/2012   Unspecified deficiency anemia 03/19/2012   Dizziness 07/09/2010   Chest pain 07/30/2009   HYPERTENSION, UNSPECIFIED 08/19/2008   PAROXYSMAL ATRIAL FIBRILLATION 08/19/2008   GASTROESOPHAGEAL REFLUX DISEASE, HX OF 08/19/2008   PCP:  [name], MD Pharmacy:   [facility] - [place], [place] - [address] [place] [address] Phone: [phone] Fax: [phone]     Social Determinants of Health (SDOH) Interventions    Readmission Risk Interventions No flowsheet data found.

## 2020-11-25 ENCOUNTER — Other Ambulatory Visit: Payer: Self-pay | Admitting: Pulmonary Disease

## 2020-11-25 DIAGNOSIS — J189 Pneumonia, unspecified organism: Secondary | ICD-10-CM | POA: Diagnosis not present

## 2020-11-25 LAB — MAGNESIUM: Magnesium: 1.5 mg/dL — ABNORMAL LOW (ref 1.7–2.4)

## 2020-11-25 LAB — CULTURE, BLOOD (ROUTINE X 2): Special Requests: ADEQUATE

## 2020-11-25 LAB — BASIC METABOLIC PANEL
Anion gap: 10 (ref 5–15)
BUN: 21 mg/dL (ref 8–23)
CO2: 23 mmol/L (ref 22–32)
Calcium: 8.5 mg/dL — ABNORMAL LOW (ref 8.9–10.3)
Chloride: 101 mmol/L (ref 98–111)
Creatinine, Ser: 1.14 mg/dL — ABNORMAL HIGH (ref 0.44–1.00)
GFR, Estimated: 45 mL/min — ABNORMAL LOW (ref >60)
Glucose, Bld: 119 mg/dL — ABNORMAL HIGH (ref 70–99)
Potassium: 3.5 mmol/L (ref 3.5–5.1)
Sodium: 134 mmol/L — ABNORMAL LOW (ref 135–145)

## 2020-11-25 MED ORDER — POTASSIUM CHLORIDE CRYS ER 20 MEQ PO TBCR
30.0000 meq | EXTENDED_RELEASE_TABLET | Freq: Once | ORAL | Status: AC
  Administered 2020-11-25: 30 meq via ORAL
  Filled 2020-11-25: qty 1

## 2020-11-25 MED ORDER — WHITE PETROLATUM EX OINT
TOPICAL_OINTMENT | CUTANEOUS | Status: AC
  Filled 2020-11-25: qty 28.35

## 2020-11-25 MED ORDER — FUROSEMIDE 10 MG/ML IJ SOLN
40.0000 mg | Freq: Every day | INTRAMUSCULAR | Status: DC
  Administered 2020-11-26: 40 mg via INTRAVENOUS
  Filled 2020-11-25: qty 4

## 2020-11-25 MED ORDER — MAGNESIUM SULFATE 4 GM/100ML IV SOLN
4.0000 g | Freq: Once | INTRAVENOUS | Status: AC
  Administered 2020-11-25: 4 g via INTRAVENOUS
  Filled 2020-11-25: qty 100

## 2020-11-25 NOTE — Progress Notes (Signed)
SATURATION QUALIFICATIONS: (This note is used to comply with regulatory documentation for home oxygen)  Patient Saturations on Room Air at Rest = 85%  Patient Saturations on Room Air while Ambulating = NT as pt desat on RA  Patient Saturations on 2 Liters of oxygen while Ambulating = 90%  Please briefly explain why patient needs home oxygen:Pt needs O2 to maintain sats.  Cannon Quinton M,PT Acute Rehab Services [phone] (pager)

## 2020-11-25 NOTE — Progress Notes (Signed)
PROGRESS NOTE    [name]  RCB:[id number] DOB: [birth year] DOA: 11/21/2020 PCP: [name], MD    Brief Narrative:  [name] is a 87 y.o. female with medical history significant for chronic diastolic CHF (EF 80-32%), paroxysmal atrial fibrillation not on anticoagulation due to low A. fib burden, sinus node dysfunction s/p PPM, CKD stage IIIa, hypertension, asthma who presented to the ED for evaluation of confusion and fever. Patient reports several days of cough productive of yellow sputum, occasional shortness of breath, chills, and new fever today.  She lives at home with family who reported that she appeared to be newly confused today which is unusual for her.  She denies any chest pain, abdominal pain, nausea, vomiting, dysuria.   In the ED, BP 164/63, pulse 96, RR 22, temp 103 F, SPO2 92% on room air. WBC 7.1, hemoglobin 11.0, not able to estimate platelets due to clumps, sodium 136, potassium 3.9, bicarb 24, BUN 20, creatinine 1.06, serum glucose 138. SARS-CoV-2 and influenza are negative. Portable chest x-ray shows right upper lobe pneumonia. CT head without contrast is negative for acute intracranial abnormality. Blood cultures collected.  Patient was given 2 L normal saline, IV Flagyl, cefepime, ceftriaxone, azithromycin.  The hospitalist service was consulted to admit for further evaluation and management.   Assessment & Plan:   Principal Problem:   Right upper lobe pneumonia Active Problems:   Asthma   Chronic diastolic CHF (congestive heart failure), NYHA class 2 (HCC)   CKD (chronic kidney disease), stage III   Essential hypertension   PAF (paroxysmal atrial fibrillation) (HCC)   Acute metabolic encephalopathy     Acute metabolic encephalopathy, POA Patient presenting to the ED with confusion, found to have elevated temperature of 103.0 F.  Usually alert and oriented at baseline per family reports.  CT head without contrast with no acute  intracranial abnormality.  Urinalysis unremarkable and urine culture with no growth.  Chest x-ray consistent with right upper lobe pneumonia.  Elevated procalcitonin of 3.43.  Covid-19 PCR and influenza a/B PCR negative.  No significant electrolyte abnormalities.  Continue treatment as below.  Community Aquired/Aspiration Pneumonia, suspect atypical vs gram negative organism Patient presented with progressive shortness of breath, fever 103.0 F, productive cough of yellow sputum. Imaging notable for right upper lobe consolidation.  Procalcitonin elevated 3.43. --Blood cultures x2: 1/4 positive for staph; likely contaminant  --Azithromycin 500 mg IV q24h x 5 days --Ceftriaxone 2 g IV q24h x 5 days --Supportive care --ambulatory O2 screen with desaturation down to 87% on room air, recovers on 2 L [place]   Frequent falls at home Recent distal right radial fracture with cast in place: --PT/OT recommends SNF, TOC for placement --Outpatient follow-up with [facility]   Chronic diastolic CHF, compensated: Last EF 65-70%.  S/p 2 L NS in the ED.   --net negative 1.8L last 24h and net positive 2.8L with mult unmeasured urinary occurences  --Wt 78.1>77.6>75.7kg --Lasix 40mg  IV q24h  --Strict I's and O's and daily weights --BMP daily   Paroxysmal atrial fibrillation: Not on anticoagulation due to rare A. fib burden per prior cardiology notes.  Not requiring rate or rhythm controlling medications.  In sinus rhythm on admission.   CKD stage IIIa: Chronic and stable.  Creatinine 1.09.   Sinus node dysfunction: s/p PPM.  Outpatient follow-up with cardiology.   Asthma: Continue Breo Ellipta, Singulair, albuterol as needed.  Supplemental oxygen if needed.  Anxiety/depression: --Sertraline 50 mg p.o. daily  Iron deficiency  anemia: Hemoglobin 10.6, stable. --Ferrous sulfate 3 and 25 mg p.o. daily  GERD: Famotidine 20 mg p.o. twice daily   Essential hypertension: BP 129/57, well  controlled. --Continue to hold home amlodipine 2.5 mg daily tablet for now --Monitor BP closely   DVT prophylaxis: enoxaparin (LOVENOX) injection 30 mg Start: 11/21/20 2345   Code Status: DNR Family Communication: No family present at bedside this morning, attempted to update patient's daughter [name] via telephone this afternoon, unsuccessful with mailbox full.  Disposition Plan:  Level of care: Med-Surg Status is: Inpatient  Remains inpatient appropriate because: Continues with confusion, weakness, debility.  On IV antibiotics, awaiting sputum, blood culture results.  Awaiting PT/OT evaluation.   Consultants:  None  Procedures:  None  Antimicrobials:  Azithromycin 10/14 >> Ceftriaxone 10/14>> Vancomycin 10/14 - 10/14 Cefepime 10/14 - 10/14 Metronidazole 10/14 - 10/14   Subjective: Patient seen examined at bedside, resting comfortably.  Sitting in bedside chair.  Reports no appetite today.  Less cough.  Was able to ambulate more with physical therapy.  Family has decided to proceed with SNF placement. No family present at this time.  No other specific complaints at this time.  Denies headache, no visual changes, no chest pain, no palpitations, no shortness of breath, no abdominal pain.  No acute events overnight per nursing staff.  Objective: Vitals:   11/25/20 0237 11/25/20 0416 11/25/20 0735 11/25/20 0804  BP:  (!) 131/54 (!) 129/57   Pulse:  68 66   Resp:  (!) 22 18   Temp:  99.3 F (37.4 C) 98.4 F (36.9 C)   TempSrc:  Oral Oral   SpO2: 93% 95% 94% 93%  Weight:  75.7 kg      Intake/Output Summary (Last 24 hours) at 11/25/2020 1347 Last data filed at 11/25/2020 0900 Gross per 24 hour  Intake 460 ml  Output 2550 ml  Net -2090 ml   Filed Weights   11/23/20 0423 11/24/20 0356 11/25/20 0416  Weight: 78.1 kg 77.6 kg 75.7 kg    Examination:  General exam: Appears calm and comfortable, pleasantly confused, elderly in appearance Respiratory system: Clear to  auscultation. Respiratory effort normal.  On 2 L nasal cannula cardiovascular system: S1 & S2 heard, RRR. No JVD, murmurs, rubs, gallops or clicks. No pedal edema. Gastrointestinal system: Abdomen is nondistended, soft and nontender. No organomegaly or masses felt. Normal bowel sounds heard. Central nervous system: Alert, oriented to place [facility]), and person Airline pilot) but not time [id number]). No focal neurological deficits. Extremities: Symmetric 5 x 5 power. Skin: No rashes, lesions or ulcers Psychiatry: Judgement and insight appear poor. Mood & affect appropriate.     Data Reviewed: I have personally reviewed following labs and imaging studies  CBC: Recent Labs  Lab 11/21/20 1901 11/21/20 1940 11/22/20 0159 11/23/20 0137 11/24/20 0226  WBC 7.1  --  8.4 6.0 5.9  NEUTROABS 6.0  --   --   --   --   HGB 11.0* 11.2* 10.6* 8.7* 8.9*  HCT 33.4* 33.0* 32.5* 27.0* 27.2*  MCV 100.3*  --  102.2* 101.1* 101.1*  PLT PLATELET CLUMPS NOTED ON SMEAR, UNABLE TO ESTIMATE  --  114* 114* 878*   Basic Metabolic Panel: Recent Labs  Lab 11/21/20 1901 11/21/20 1940 11/22/20 0159 11/23/20 0137 11/24/20 0226 11/25/20 0146  NA 136 137 138 136 137 134*  K 3.9 3.9 3.7 3.6 3.3* 3.5  CL 105 102 108 106 105 101  CO2 24  --  22 22  23 23  GLUCOSE 138* 132* 106* 115* 121* 119*  BUN 20 21 17 21 20 21   CREATININE 1.06* 0.90 0.96 1.10* 1.09* 1.14*  CALCIUM 9.2  --  8.6* 8.2* 8.4* 8.5*  MG  --   --   --  1.6*  --  1.5*   GFR: CrCl cannot be calculated (Unknown ideal weight.). Liver Function Tests: Recent Labs  Lab 11/21/20 1901  AST 15  ALT 11  ALKPHOS 98  BILITOT 1.2  PROT 6.0*  ALBUMIN 3.6   No results for input(s): LIPASE, AMYLASE in the last 168 hours. No results for input(s): AMMONIA in the last 168 hours. Coagulation Profile: Recent Labs  Lab 11/21/20 1901  INR 1.2   Cardiac Enzymes: No results for input(s): CKTOTAL, CKMB, CKMBINDEX, TROPONINI in the last 168  hours. BNP (last 3 results) No results for input(s): PROBNP in the last 8760 hours. HbA1C: No results for input(s): HGBA1C in the last 72 hours. CBG: No results for input(s): GLUCAP in the last 168 hours. Lipid Profile: No results for input(s): CHOL, HDL, LDLCALC, TRIG, CHOLHDL, LDLDIRECT in the last 72 hours. Thyroid Function Tests: No results for input(s): TSH, T4TOTAL, FREET4, T3FREE, THYROIDAB in the last 72 hours. Anemia Panel: No results for input(s): VITAMINB12, FOLATE, FERRITIN, TIBC, IRON, RETICCTPCT in the last 72 hours. Sepsis Labs: Recent Labs  Lab 11/21/20 1901 11/22/20 0159 11/23/20 0137 11/24/20 0226  PROCALCITON  --  3.43 4.96 2.73  LATICACIDVEN 1.0  --   --   --     Recent Results (from the past 240 hour(s))  Blood Culture (routine x 2)     Status: Abnormal   Collection Time: 11/21/20  7:21 PM   Specimen: BLOOD RIGHT ARM  Result Value Ref Range Status   Specimen Description BLOOD RIGHT ARM  Final   Special Requests   Final    BOTTLES DRAWN AEROBIC AND ANAEROBIC Blood Culture adequate volume   Culture  Setup Time   Final    GRAM POSITIVE COCCI IN CLUSTERS AEROBIC BOTTLE ONLY CRITICAL RESULT CALLED TO, READ BACK BY AND VERIFIED WITH: PHARMD C AMEND AT 1324 11/23/2020 BY L BENFIELD    Culture (A)  Final    STAPHYLOCOCCUS HOMINIS THE SIGNIFICANCE OF ISOLATING THIS ORGANISM FROM A SINGLE SET OF BLOOD CULTURES WHEN MULTIPLE SETS ARE DRAWN IS UNCERTAIN. PLEASE NOTIFY THE MICROBIOLOGY DEPARTMENT WITHIN ONE WEEK IF SPECIATION AND SENSITIVITIES ARE REQUIRED. Performed at [facility], [address] [place], [place] [address]    Report Status 11/25/2020 FINAL  Final  Blood Culture ID Panel (Reflexed)     Status: Abnormal   Collection Time: 11/21/20  7:21 PM  Result Value Ref Range Status   Enterococcus faecalis NOT DETECTED NOT DETECTED Final   Enterococcus Faecium NOT DETECTED NOT DETECTED Final   Listeria monocytogenes NOT DETECTED NOT DETECTED Final    Staphylococcus species DETECTED (A) NOT DETECTED Final    Comment: PHARMD C AMEND AT 7253 11/23/2020 BY L BENFIELD   Staphylococcus aureus (BCID) NOT DETECTED NOT DETECTED Final   Staphylococcus epidermidis NOT DETECTED NOT DETECTED Final   Staphylococcus lugdunensis NOT DETECTED NOT DETECTED Final   Streptococcus species NOT DETECTED NOT DETECTED Final   Streptococcus agalactiae NOT DETECTED NOT DETECTED Final   Streptococcus pneumoniae NOT DETECTED NOT DETECTED Final   Streptococcus pyogenes NOT DETECTED NOT DETECTED Final   A.calcoaceticus-baumannii NOT DETECTED NOT DETECTED Final   Bacteroides fragilis NOT DETECTED NOT DETECTED Final   Enterobacterales NOT  DETECTED NOT DETECTED Final   Enterobacter cloacae complex NOT DETECTED NOT DETECTED Final   Escherichia coli NOT DETECTED NOT DETECTED Final   Klebsiella aerogenes NOT DETECTED NOT DETECTED Final   Klebsiella oxytoca NOT DETECTED NOT DETECTED Final   Klebsiella pneumoniae NOT DETECTED NOT DETECTED Final   Proteus species NOT DETECTED NOT DETECTED Final   Salmonella species NOT DETECTED NOT DETECTED Final   Serratia marcescens NOT DETECTED NOT DETECTED Final   Haemophilus influenzae NOT DETECTED NOT DETECTED Final   Neisseria meningitidis NOT DETECTED NOT DETECTED Final   Pseudomonas aeruginosa NOT DETECTED NOT DETECTED Final   Stenotrophomonas maltophilia NOT DETECTED NOT DETECTED Final   Candida albicans NOT DETECTED NOT DETECTED Final   Candida auris NOT DETECTED NOT DETECTED Final   Candida glabrata NOT DETECTED NOT DETECTED Final   Candida krusei NOT DETECTED NOT DETECTED Final   Candida parapsilosis NOT DETECTED NOT DETECTED Final   Candida tropicalis NOT DETECTED NOT DETECTED Final   Cryptococcus neoformans/gattii NOT DETECTED NOT DETECTED Final    Comment: Performed at [facility], [place] [address] [place], [place] [address]  Resp Panel by RT-PCR (Flu A&B, Covid) Nasopharyngeal Swab     Status: None   Collection  Time: 11/21/20  7:30 PM   Specimen: Nasopharyngeal Swab; Nasopharyngeal(NP) swabs in vial transport medium  Result Value Ref Range Status   SARS Coronavirus 2 by RT PCR NEGATIVE NEGATIVE Final    Comment: (NOTE) SARS-CoV-2 target nucleic acids are NOT DETECTED.  The SARS-CoV-2 RNA is generally detectable in upper respiratory specimens during the acute phase of infection. The lowest concentration of SARS-CoV-2 viral copies this assay can detect is 138 copies/mL. A negative result does not preclude SARS-Cov-2 infection and should not be used as the sole basis for treatment or other patient management decisions. A negative result may occur with  improper specimen collection/handling, submission of specimen other than nasopharyngeal swab, presence of viral mutation(s) within the areas targeted by this assay, and inadequate number of viral copies(<138 copies/mL). A negative result must be combined with clinical observations, patient history, and epidemiological information. The expected result is Negative.  Fact Sheet for Patients:  EntrepreneurPulse.com.au  Fact Sheet for Healthcare Providers:  IncredibleEmployment.be  This test is no t yet approved or cleared by the [place] FDA and  has been authorized for detection and/or diagnosis of SARS-CoV-2 by FDA under an Emergency Use Authorization (EUA). This EUA will remain  in effect (meaning this test can be used) for the duration of the COVID-19 declaration under Section 564(b)(1) of the Act, 21 U.S.C.section 360bbb-3(b)(1), unless the authorization is terminated  or revoked sooner.       Influenza A by PCR NEGATIVE NEGATIVE Final   Influenza B by PCR NEGATIVE NEGATIVE Final    Comment: (NOTE) The Xpert Xpress SARS-CoV-2/FLU/RSV plus assay is intended as an aid in the diagnosis of influenza from Nasopharyngeal swab specimens and should not be used as a sole basis for treatment. Nasal washings  and aspirates are unacceptable for Xpert Xpress SARS-CoV-2/FLU/RSV testing.  Fact Sheet for Patients: EntrepreneurPulse.com.au  Fact Sheet for Healthcare Providers: IncredibleEmployment.be  This test is not yet approved or cleared by the [place] FDA and has been authorized for detection and/or diagnosis of SARS-CoV-2 by FDA under an Emergency Use Authorization (EUA). This EUA will remain in effect (meaning this test can be used) for the duration of the COVID-19 declaration under Section 564(b)(1) of the Act, 21 U.S.C. section 360bbb-3(b)(1), unless the  authorization is terminated or revoked.  Performed at [facility], [place] [address], [place], [place] [address]   Blood Culture (routine x 2)     Status: None (Preliminary result)   Collection Time: 11/21/20  7:30 PM   Specimen: BLOOD LEFT WRIST  Result Value Ref Range Status   Specimen Description BLOOD LEFT WRIST  Final   Special Requests   Final    BOTTLES DRAWN AEROBIC AND ANAEROBIC Blood Culture results may not be optimal due to an inadequate volume of blood received in culture bottles   Culture   Final    NO GROWTH 4 DAYS Performed at [facility], [place] [address] [place], [place] [address]    Report Status PENDING  Incomplete  Urine Culture     Status: None   Collection Time: 11/22/20  7:42 AM   Specimen: In/Out Cath Urine  Result Value Ref Range Status   Specimen Description IN/OUT CATH URINE  Final   Special Requests NONE  Final   Culture   Final    NO GROWTH Performed at [facility], [place] [address], [place], [place] [address]    Report Status 11/23/2020 FINAL  Final         Radiology Studies: DG CHEST PORT 1 VIEW  Result Date: 11/24/2020 CLINICAL DATA:  Shortness of breath, cough, congestion EXAM: PORTABLE CHEST 1 VIEW COMPARISON:  11/21/2020 FINDINGS: Persistent right lung opacities with improvement in aeration. Pulmonary vascular congestion.  Cardiomegaly. No significant pleural effusion. Left chest wall dual lead pacemaker. IMPRESSION: Persistent right lung opacities likely reflecting pneumonia. Some improvement in aeration since the prior study. Cardiomegaly and pulmonary vascular congestion. Electronically Signed   By: Macy Mis M.D.   On: 11/24/2020 08:35        Scheduled Meds:  allopurinol  100 mg Oral Daily   enoxaparin (LOVENOX) injection  30 mg Subcutaneous QHS   famotidine  20 mg Oral BID   ferrous sulfate  325 mg Oral Daily   fluticasone furoate-vilanterol  1 puff Inhalation Daily   [START ON 11/26/2020] furosemide  40 mg Intravenous Daily   melatonin  3 mg Oral QHS   montelukast  10 mg Oral QHS   sertraline  50 mg Oral Daily   Continuous Infusions:  azithromycin 500 mg (11/24/20 2117)   cefTRIAXone (ROCEPHIN)  IV 2 g (11/24/20 1944)     LOS: 3 days    Time spent: 37 minutes spent on chart review, discussion with nursing staff, consultants, updating family and interview/physical exam; more than 50% of that time was spent in counseling and/or coordination of care.    Evelina Lore J [place] DO [facility] Available via Epic secure chat 7am-7pm After these hours, please refer to coverage provider listed on [email or web address] 11/25/2020, 1:47 PM

## 2020-11-25 NOTE — TOC Progression Note (Addendum)
Transition of Care [facility]) - Progression Note    Patient Details  Name: [name] MRN: [id number] Date of Birth: 11-Nov-1927  [facility]) CM/SW Contact  Jacalyn Lefevre Edson Snowball, RN Phone Number: 11/25/2020, 1:39 PM  Clinical Narrative:    PT updated recommendation to SNF and spoke to grand daughter [name].   NCM called patient's daughter Carollee Herter [phone] no answer and voice mail box is full.   NCM called Salli Quarry grand daughter Lemmie Evens daughter) [phone]. Per Satira Anis is a NP and cannot take phone calls at work, [name] is [place] . Per [name] she has spoken to [facility] and [name] is now in agreement for short term rehab at a SNF .   NCM explained process of faxing information to SNF in the area. Patient has been to a SNF in the past. [name] unsure of name. As soon as NCM has bed offers , they will be provided to family. Once they pick a SNF , the SNF will start insurance authorization. [name] voiced understanding.   Updated Stacie with [place]   Expected Discharge Plan: [place] Barriers to Discharge: Continued Medical Work up  Expected Discharge Plan and Services Expected Discharge Plan: [place]   Discharge Planning Services: CM Consult Post Acute Care Choice: [place] arrangements for the past 2 months: Single Family Home                 DME Arranged: N/A         HH Arranged: PT, RN [place] Agency: [place] Date [place]: 11/24/20 Time HH Agency Contacted: 35 Representative spoke with at [place]: [place],   Social Determinants of Health (SDOH) Interventions    Readmission Risk Interventions No flowsheet data found.

## 2020-11-25 NOTE — Progress Notes (Addendum)
Physical Therapy Treatment Patient Details Name: [name] MRN: [id number] DOB: [birth year] Today's Date: 11/25/2020   History of Present Illness 87 yo admitted 10/14 with confusion and fever with PNA. pt with Right wrist cast.  PMHx: CHF, PAF, PPM, CKD, HTN, asthma, gout, L TKA, Left hip fx    PT Comments    Pt admitted with above diagnosis. Pt very unsteady at times during treatment today with sit to stand and with ambulation with right PFRW.  Needs at least +1 mod assist most of session and needed 2 person for ADL to clean her after BM as pt with significant posterior lean.  Pt confused as well at times.  Called pts granddaughter and she stated that pt doesn't have 24 hour care and needs to be at a point where she can do some things for herself when she returns home.  Pt currently needs 2 persons at times and +1 mod assist for a lot of tasks. O2 to 87% on RA therefore needs 2LO2  as well. Will benefit from SNF.  Updated d/c recommendation.  Pt currently with functional limitations due to balance and endurance deficits. Pt will benefit from skilled PT to increase their independence and safety with mobility to allow discharge to the venue listed below.      Recommendations for follow up therapy are one component of a multi-disciplinary discharge planning process, led by the attending physician.  Recommendations may be updated based on patient status, additional functional criteria and insurance authorization.  Follow Up Recommendations  SNF;Supervision/Assistance - 24 hour     Equipment Recommendations  Hospital bed;Rolling walker with 5" wheels (RW with right platform attachment), O2   Recommendations for Other Services OT consult     Precautions / Restrictions Precautions Precautions: Fall Precaution Comments: Watch SpO2 Required Braces or Orthoses: Splint/Cast Splint/Cast: right wrist cast - granddaughter reports that pt has had this for ~6 weeks now. Restrictions Other  Position/Activity Restrictions: Assumed NWB for R wrist     Mobility  Bed Mobility Overal bed mobility: Needs Assistance Bed Mobility: Supine to Sit Rolling: Min guard   Supine to sit: Min guard;HOB elevated     General bed mobility comments: Min Guard A for safety    Transfers Overall transfer level: Needs assistance Equipment used: 1 person hand held assist;Right platform walker Transfers: Sit to/from [facility] Sit to Stand: Mod assist;Min assist Stand pivot transfers: Min assist       General transfer comment: Min A for maintaining balance to pivot to recliner. Min Guard A for safety with sit<>stand.  Pt to 3N1 first and had BM.  Could not clean herself and needing min to mod assist to steady pt by one staff and the second staff cleaned pt.  Ambulation/Gait Ambulation/Gait assistance: Min assist;Mod assist;+2 safety/equipment Gait Distance (Feet): 75 Feet Assistive device: Rolling walker (2 wheeled) Gait Pattern/deviations: Step-through pattern;Decreased stride length;Trunk flexed;Leaning posteriorly;Staggering right;Staggering left   Gait velocity interpretation: <1.8 ft/sec, indicate of risk for recurrent falls General Gait Details: assist to direct RW at times, Pt needed a lot of incr support with posterior lean at times with and without RW.  Pt generally unsteady and needs max cues at times for safety. Pt did need right platform for RW for weight bearing and incr stabiltiy.  Pt limited by fatigue with SPO2 dropping to 85% on RA with gait.  Needs 2LO2 to maintain sats.   Stairs             Wheelchair  Mobility    Modified Rankin (Stroke Patients Only)       Balance Overall balance assessment: Needs assistance;History of Falls Sitting-balance support: No upper extremity supported;Feet supported Sitting balance-Leahy Scale: Fair Sitting balance - Comments: EOB and BSC   Standing balance support: Single extremity supported;No upper  extremity supported;During functional activity Standing balance-Leahy Scale: Poor Standing balance comment: Reliant on at least one UE for support and external support and really needs bil UE support                            Cognition Arousal/Alertness: Awake/alert Behavior During Therapy: WFL for tasks assessed/performed Overall Cognitive Status: Impaired/Different from baseline Area of Impairment: Following commands;Safety/judgement;Problem solving                 Orientation Level: Time     Following Commands: Follows one step commands with increased time Safety/Judgement: Decreased awareness of deficits   Problem Solving: Slow processing;Requires verbal cues General Comments: Pt with tangiental conversations. Requiring increased time for following commands.      Exercises General Exercises - Lower Extremity Long Arc Quad: AROM;Both;10 reps;Seated    General Comments        Pertinent Vitals/Pain Pain Assessment: No/denies pain    Home Living                      Prior Function            PT Goals (current goals can now be found in the care plan section) Progress towards PT goals: Progressing toward goals    Frequency    Min 3X/week      PT Plan Discharge plan needs to be updated    Co-evaluation              AM-PAC PT "6 Clicks" Mobility   Outcome Measure  Help needed turning from your back to your side while in a flat bed without using bedrails?: A Little Help needed moving from lying on your back to sitting on the side of a flat bed without using bedrails?: A Little Help needed moving to and from a bed to a chair (including a wheelchair)?: A Little Help needed standing up from a chair using your arms (e.g., wheelchair or bedside chair)?: A Lot Help needed to walk in hospital room?: A Lot Help needed climbing 3-5 steps with a railing? : A Lot 6 Click Score: 15    End of Session Equipment Utilized During Treatment:  Gait belt;Oxygen Activity Tolerance: Patient tolerated treatment well Patient left: in chair;with call bell/phone within reach;with chair alarm set Nurse Communication: Mobility status PT Visit Diagnosis: Other abnormalities of gait and mobility (R26.89);History of falling (Z91.81);Muscle weakness (generalized) (M62.81)     Time: 3557-3220 PT Time Calculation (min) (ACUTE ONLY): 29 min  Charges:  $Gait Training: 8-22 mins $Therapeutic Activity: 8-22 mins                     Shamel Galyean M,PT Acute Rehab Services [phone] (pager)    Alvira Philips 11/25/2020, 12:43 PM

## 2020-11-25 NOTE — Progress Notes (Signed)
Occupational Therapy Treatment Patient Details Name: [name] MRN: [id number] DOB: Mar 19, 1927 Today's Date: 11/25/2020   History of present illness 87 yo admitted 10/14 with confusion and fever with PNA. pt with Right wrist cast.  PMHx: CHF, PAF, PPM, CKD, HTN, asthma, gout, L TKA, Left hip fx   OT comments  Patient met seated in recliner. Reports fatigue from previous PT treatment session. Patient with request to return to bed secondary to pain in buttocks and fatigue. Min A for sit to stand form recliner with cues for hand placement and walker management and Min A for stand-pivot to EOB. Patient continues to be limited by deficits listed below including decreased cognition, generalized weakness, decreased safety awareness and frequent falls. Recommendation updated to SNF rehab as patient does not have 24hr supervision/assist and needs to be able to complete some ADLs on her own prior to return home. OT will continue to follow acutely.    Recommendations for follow up therapy are one component of a multi-disciplinary discharge planning process, led by the attending physician.  Recommendations may be updated based on patient status, additional functional criteria and insurance authorization.    Follow Up Recommendations  SNF;Supervision/Assistance - 24 hour    Equipment Recommendations  3 in 1 bedside commode;Hospital bed    Recommendations for Other Services      Precautions / Restrictions Precautions Precautions: Fall Precaution Comments: Watch SpO2; HOH Required Braces or Orthoses: Splint/Cast Splint/Cast: right wrist cast - granddaughter reports that pt has had this for ~6 weeks now. Restrictions Other Position/Activity Restrictions: Assumed NWB for R wrist       Mobility Bed Mobility Overal bed mobility: Needs Assistance Bed Mobility: Sit to Supine Rolling: Min guard   Supine to sit: Min guard;HOB elevated Sit to supine: Min assist   General bed mobility  comments: Able to control descent of trunk but required Min A to advance BLE from EOB to bed surface.    Transfers Overall transfer level: Needs assistance Equipment used: 1 person hand held assist;Right platform walker Transfers: Sit to/from [employer] Transfers Sit to Stand: Mod assist;Min assist Stand pivot transfers: Min assist       General transfer comment: Min A for sit to stand from recliner with cues for safety and hand placement. Patient attempting to stand before therapist was ready. Min A for stand-pivot to EOB with R PFRW and cues for proximity and walker management.    Balance Overall balance assessment: Needs assistance;History of Falls Sitting-balance support: No upper extremity supported;Feet supported Sitting balance-Leahy Scale: Fair Sitting balance - Comments: EOB and BSC   Standing balance support: Single extremity supported;No upper extremity supported;During functional activity Standing balance-Leahy Scale: Poor Standing balance comment: Reliant on at least one UE for support and external support and really needs bil UE support                           ADL either performed or assessed with clinical judgement   ADL Overall ADL's : Needs assistance/impaired Eating/Feeding: Set up;Sitting   Grooming: Wash/dry hands;Set up;Supervision/safety;Sitting                   Toilet Transfer: Minimal assistance;Stand-pivot Toilet Transfer Details (indicate cue type and reason): Simulated with transfer from recliner to EOB with use of PFRW.                 Vision       Perception  Praxis      Cognition Arousal/Alertness: Awake/alert Behavior During Therapy: WFL for tasks assessed/performed Overall Cognitive Status: Impaired/Different from baseline Area of Impairment: Following commands;Safety/judgement;Problem solving                 Orientation Level: Time     Following Commands: Follows one step commands with  increased time Safety/Judgement: Decreased awareness of deficits   Problem Solving: Slow processing;Requires verbal cues General Comments: Cues for attention to task. Patient very [place].        Exercises Exercises: General Lower Extremity General Exercises - Lower Extremity Long Arc Quad: AROM;Both;10 reps;Seated   Shoulder Instructions       General Comments VSS on O2 via [place].    Pertinent Vitals/ Pain       Pain Assessment: Faces Faces Pain Scale: Hurts little more Pain Location: Buttocks Pain Descriptors / Indicators: Sore Pain Intervention(s): Repositioned  Home Living                                          Prior Functioning/Environment              Frequency  Min 2X/week        Progress Toward Goals  OT Goals(current goals can now be found in the care plan section)  Progress towards OT goals: Progressing toward goals  Acute Rehab OT Goals Patient Stated Goal: "I'd like to stop coughing so bad." OT Goal Formulation: With patient Time For Goal Achievement: 12/08/20 Potential to Achieve Goals: Good ADL Goals Pt Will Perform Grooming: with min guard assist;standing Pt Will Perform Lower Body Dressing: with min assist;sit to/from stand Pt Will Transfer to Toilet: with min assist;bedside commode;ambulating Pt Will Perform Toileting - Clothing Manipulation and hygiene: sit to/from stand;sitting/lateral leans;with supervision  Plan Frequency remains appropriate;Discharge plan needs to be updated    Co-evaluation                 AM-PAC OT "6 Clicks" Daily Activity     Outcome Measure   Help from another person eating meals?: None Help from another person taking care of personal grooming?: A Little Help from another person toileting, which includes using toliet, bedpan, or urinal?: A Little Help from another person bathing (including washing, rinsing, drying)?: A Lot Help from another person to put on and taking off regular upper  body clothing?: A Little Help from another person to put on and taking off regular lower body clothing?: A Lot 6 Click Score: 17    End of Session Equipment Utilized During Treatment: Gait belt;Oxygen;Other (comment) (PFRW)  OT Visit Diagnosis: Unsteadiness on feet (R26.81);Other abnormalities of gait and mobility (R26.89);Muscle weakness (generalized) (M62.81)   Activity Tolerance Patient limited by fatigue;Patient limited by lethargy   Patient Left in bed;with call bell/phone within reach;with bed alarm set   Nurse Communication Mobility status        Time: 7096-4383 OT Time Calculation (min): 10 min  Charges: OT General Charges $OT Visit: 1 Visit OT Treatments $Therapeutic Activity: 8-22 mins  Hobart Marte H. OTR/L Supplemental OT, Department of rehab services [phone]  Lenette Rau R H.  11/25/2020, 2:16 PM

## 2020-11-25 NOTE — Care Management Important Message (Signed)
Important Message  Patient Details  Name: GIANNY KILLMAN MRN: [id number] Date of Birth: 10/13/1927   Medicare Important Message Given:  Yes     Ellyn Rubiano Montine Circle 11/25/2020, 3:39 PM

## 2020-11-25 NOTE — NC FL2 (Signed)
[place] MEDICAID FL2 LEVEL OF CARE SCREENING TOOL     IDENTIFICATION  Patient Name: [name] Birthdate: 05-May-1927 Sex: female Admission Date (Current Location): 11/21/2020  [facility] and [place] Number:  Herbalist and Address:  [facility], [place] [address], [place], [place] [address]      Provider Number: [id number]  Attending Physician Name and Address:  [place] Eric J, DO  Relative Name and Phone Number:  Salli Quarry [phone]    Current Level of Care: Hospital Recommended Level of Care: [place] Prior Approval Number:    Date Approved/Denied:   PASRR Number: [id number]  Discharge Plan: SNF    Current Diagnoses: Patient Active Problem List   Diagnosis Date Noted   Acute metabolic encephalopathy 34/19/3790   Right upper lobe pneumonia 11/21/2020   Non-ST elevated myocardial infarction ([place]) 06/27/2019   Hip fracture ([place]) 02/10/2018   Closed comminuted intertrochanteric fracture of left femur ([place]) 02/10/2018   Pacemaker 07/06/2017   Chronic respiratory failure with hypoxia ([place]) 06/09/2017   Pleural effusion associated with pulmonary infection 06/09/2017   Hyperkalemia 06/09/2017   Pleuritic chest pain 06/09/2017   Fever 05/14/2017   Renal insufficiency    Constipation 04/25/2017   Urine finding 04/25/2017   Posterior rhinorrhea 04/25/2017   Acute upper respiratory infection 04/25/2017   Thrombocytopenia ([place]) 01/15/2017   CAP (community acquired pneumonia) 01/15/2017   Symptomatic bradycardia 06/30/2016   Hypertensive heart disease 04/01/2016   Bradycardia 04/01/2016   Fall 04/01/2016   Sinus bradycardia    CKD (chronic kidney disease), stage III    Normochromic normocytic anemia    Essential hypertension    PAF (paroxysmal atrial fibrillation) (HCC)    Palpitations 09/18/2014   Shortness of breath 03/19/2014   Chronic diastolic CHF (congestive heart failure), NYHA class 2 ([place])  03/19/2014   Fatigue 09/11/2013   Gastroenteritis 07/20/2013   Abdominal pain 07/20/2013   Abnormal x-ray of abdomen 07/20/2013   Diarrhea 07/20/2013   Generalized weakness 07/20/2013   Obesity (BMI 30-39.9) 07/20/2013   Hypercholesteremia    GERD (gastroesophageal reflux disease)    Depression    Asthma    Gout    Acute blood loss anemia 04/05/2013   Osteoarthritis of left knee 04/04/2013   Total knee replacement status 04/04/2013   Pulmonary hypertension ([place]) 03/07/2013   Anemia, unspecified 03/19/2012   Unspecified deficiency anemia 03/19/2012   Dizziness 07/09/2010   Chest pain 07/30/2009   HYPERTENSION, UNSPECIFIED 08/19/2008   PAROXYSMAL ATRIAL FIBRILLATION 08/19/2008   GASTROESOPHAGEAL REFLUX DISEASE, HX OF 08/19/2008    Orientation RESPIRATION BLADDER Height & Weight     Self, Situation, Place  O2 (2 l [place] continuous) Continent Weight: 75.7 kg Height:     BEHAVIORAL SYMPTOMS/MOOD NEUROLOGICAL BOWEL NUTRITION STATUS      Continent Diet  AMBULATORY STATUS COMMUNICATION OF NEEDS Skin   Extensive Assist Verbally Normal                       Personal Care Assistance Level of Assistance  Bathing, Feeding, Dressing Bathing Assistance: Limited assistance Feeding assistance: Limited assistance Dressing Assistance: Limited assistance     Functional Limitations Info  Sight, Hearing Sight Info: Adequate Hearing Info: Adequate      SPECIAL CARE FACTORS FREQUENCY  PT (By licensed PT), OT (By licensed OT)     PT Frequency: five times a week OT Frequency: five times a week  Contractures Contractures Info: Not present    Additional Factors Info  Code Status, Allergies Code Status Info: DNR Allergies Info: Diltiazem, Diltiazem Hcl, Losartan Potassium, Potassium containing compounds, sulfonamide derivatives, Penicillins, amoxicillin, zetia           Current Medications (11/25/2020):  This is the current hospital active medication  list Current Facility-Administered Medications  Medication Dose Route Frequency Provider Last Rate Last Admin   acetaminophen (TYLENOL) tablet 650 mg  650 mg Oral Q6H PRN Lenore Cordia, MD   650 mg at 11/23/20 1648   Or   acetaminophen (TYLENOL) suppository 650 mg  650 mg Rectal Q6H PRN Lenore Cordia, MD       albuterol (PROVENTIL) (2.5 MG/3ML) 0.083% nebulizer solution 2.5 mg  2.5 mg Nebulization Q2H PRN Zada Finders R, MD   2.5 mg at 11/25/20 0148   allopurinol (ZYLOPRIM) tablet 100 mg  100 mg Oral Daily Zada Finders R, MD   100 mg at 11/25/20 1046   azithromycin (ZITHROMAX) 500 mg in sodium chloride 0.9 % 250 mL IVPB  500 mg Intravenous Q24H Zada Finders R, MD 250 mL/hr at 11/24/20 2117 500 mg at 11/24/20 2117   cefTRIAXone (ROCEPHIN) 2 g in sodium chloride 0.9 % 100 mL IVPB  2 g Intravenous Q24H Zada Finders R, MD 200 mL/hr at 11/24/20 1944 2 g at 11/24/20 1944   enoxaparin (LOVENOX) injection 30 mg  30 mg Subcutaneous QHS Zada Finders R, MD   30 mg at 11/24/20 2132   famotidine (PEPCID) tablet 20 mg  20 mg Oral BID Zada Finders R, MD   20 mg at 11/25/20 1046   ferrous sulfate tablet 325 mg  325 mg Oral Daily Zada Finders R, MD   325 mg at 11/25/20 1046   fluticasone furoate-vilanterol (BREO ELLIPTA) 100-25 MCG/INH 1 puff  1 puff Inhalation Daily Zada Finders R, MD   1 puff at 11/25/20 0803   [START ON 11/26/2020] furosemide (LASIX) injection 40 mg  40 mg Intravenous Daily [place] Eric J, DO       guaiFENesin (MUCINEX) 12 hr tablet 600 mg  600 mg Oral BID PRN Zada Finders R, MD   600 mg at 11/24/20 2134   melatonin tablet 3 mg  3 mg Oral QHS [place] Donnamarie Poag, DO   3 mg at 11/24/20 2131   montelukast (SINGULAIR) tablet 10 mg  10 mg Oral QHS Zada Finders R, MD   10 mg at 11/24/20 2132   ondansetron (ZOFRAN) tablet 4 mg  4 mg Oral Q6H PRN Lenore Cordia, MD       Or   ondansetron (ZOFRAN) injection 4 mg  4 mg Intravenous Q6H PRN Zada Finders R, MD   4 mg at 11/22/20 1301    senna-docusate (Senokot-S) tablet 1 tablet  1 tablet Oral QHS PRN Lenore Cordia, MD       sertraline (ZOLOFT) tablet 50 mg  50 mg Oral Daily Lenore Cordia, MD   50 mg at 11/25/20 1046     Discharge Medications: Please see discharge summary for a list of discharge medications.  Relevant Imaging Results:  Relevant Lab Results:   Additional Information Cast rt arm, SS [phone], Moderna sars-covid 2 vaccination 05/08/19 and 04/17/19  Marilu Favre, RN

## 2020-11-26 DIAGNOSIS — N1832 Chronic kidney disease, stage 3b: Secondary | ICD-10-CM | POA: Diagnosis not present

## 2020-11-26 DIAGNOSIS — I5032 Chronic diastolic (congestive) heart failure: Secondary | ICD-10-CM | POA: Diagnosis not present

## 2020-11-26 DIAGNOSIS — J189 Pneumonia, unspecified organism: Secondary | ICD-10-CM | POA: Diagnosis not present

## 2020-11-26 DIAGNOSIS — G9341 Metabolic encephalopathy: Secondary | ICD-10-CM

## 2020-11-26 LAB — CULTURE, BLOOD (ROUTINE X 2): Culture: NO GROWTH

## 2020-11-26 LAB — BASIC METABOLIC PANEL
Anion gap: 8 (ref 5–15)
BUN: 29 mg/dL — ABNORMAL HIGH (ref 8–23)
CO2: 24 mmol/L (ref 22–32)
Calcium: 8 mg/dL — ABNORMAL LOW (ref 8.9–10.3)
Chloride: 97 mmol/L — ABNORMAL LOW (ref 98–111)
Creatinine, Ser: 1.18 mg/dL — ABNORMAL HIGH (ref 0.44–1.00)
GFR, Estimated: 44 mL/min — ABNORMAL LOW (ref >60)
Glucose, Bld: 120 mg/dL — ABNORMAL HIGH (ref 70–99)
Potassium: 3.5 mmol/L (ref 3.5–5.1)
Sodium: 129 mmol/L — ABNORMAL LOW (ref 135–145)

## 2020-11-26 LAB — MAGNESIUM: Magnesium: 2.4 mg/dL (ref 1.7–2.4)

## 2020-11-26 NOTE — Progress Notes (Addendum)
Patient ID: CARLISSA PESOLA, female   DOB: [birth year], 87 y.o.   MRN: [id number]  PROGRESS NOTE    LAYLONI FAHRNER  NID:[id number] DOB: 18-Sep-1927 DOA: 11/21/2020 PCP: [name], MD   Brief Narrative:  87 y.o. female with medical history significant for chronic diastolic CHF (EF 14-43%), paroxysmal atrial fibrillation not on anticoagulation due to low A. fib burden, sinus node dysfunction s/p PPM, CKD stage IIIa, hypertension, asthma who presented to the ED for evaluation of confusion and fever/cough.  On presentation, she was febrile, tachypneic.  COVID and influenza tests were negative.  Chest x-ray showed possible right upper lobe pneumonia.  CT head was negative for acute intracranial abnormality.  She was started on IV fluids and antibiotics.  Assessment & Plan:   Community-acquired right upper lobe pneumonia with the possibility of aspiration pneumonia as well Hypoxia -Treated with Rocephin and Zithromax for 5 days. -Currently still requiring 2 L oxygen via nasal cannula: Wean off as able  Acute metabolic encephalopathy -Possibly from above.  CT of the head was negative for acute intracranial abnormality. -Monitor mental status.  Fall precautions.  Delirium precautions. -Mental status possibly back to baseline currently  Recent distal right radius fracture with cast in place Frequent falls at home -Outpatient follow-up with [facility].  PT/OT recommending SNF placement.  TOC following  Chronic diastolic CHF -Strict input and output.  Daily weights.  Fluid restriction.  Currently on IV Lasix.  DC Lasix because of worsening hyponatremia.  Outpatient follow-up with cardiology.  Paroxysmal A. fib -Not on anticoagulation due to low A. fib burden per cardiology notes.  Currently rate controlled.  Not on any rate or rhythm controlling medications.  Outpatient follow-up with cardiology  CKD stage IIIa -Chronic and stable.  Monitor intermittently  Sinus node  dysfunction status post PPM -Outpatient follow-up with cardiology  Asthma -Continue Singulair, Breo Ellipta and albuterol as needed.  Anxiety/depression-continue sertraline  Iron deficiency anemia -Continue oral ferrous sulfate.  Hemoglobin stable  GERD -Continue famotidine  Essential hypertension -Blood pressure stable.  Continue IV Lasix, Lasix plan as above.  Amlodipine on hold  Possible history of gout -Continue allopurinol  DVT prophylaxis: Lovenox Code Status: DNR Family Communication: Spoke to granddaughter/Suzanne on phone on 11/26/20 Disposition Plan: Status is: Inpatient  Remains inpatient appropriate because: Will need SNF placement once bed is available  Consultants: None  Procedures: None  Antimicrobials:  Anti-infectives (From admission, onward)    Start     Dose/Rate Route Frequency Ordered Stop   11/22/20 2000  cefTRIAXone (ROCEPHIN) 2 g in sodium chloride 0.9 % 100 mL IVPB        2 g 200 mL/hr over 30 Minutes Intravenous Every 24 hours 11/21/20 2326 11/25/20 2352   11/22/20 2000  azithromycin (ZITHROMAX) 500 mg in sodium chloride 0.9 % 250 mL IVPB        500 mg 250 mL/hr over 60 Minutes Intravenous Every 24 hours 11/21/20 2326 11/26/20 0026   11/21/20 1945  cefTRIAXone (ROCEPHIN) 1 g in sodium chloride 0.9 % 100 mL IVPB        1 g 200 mL/hr over 30 Minutes Intravenous  Once 11/21/20 1938 11/21/20 2045   11/21/20 1945  azithromycin (ZITHROMAX) 500 mg in sodium chloride 0.9 % 250 mL IVPB        500 mg 250 mL/hr over 60 Minutes Intravenous  Once 11/21/20 1938 11/21/20 2109   11/21/20 1915  ceFEPIme (MAXIPIME) 2 g in sodium chloride 0.9 % 100 mL IVPB  Status:  Discontinued        2 g 200 mL/hr over 30 Minutes Intravenous  Once 11/21/20 1909 11/21/20 1937   11/21/20 1915  metroNIDAZOLE (FLAGYL) IVPB 500 mg  Status:  Discontinued        500 mg 100 mL/hr over 60 Minutes Intravenous  Once 11/21/20 1909 11/21/20 1937   11/21/20 1915  vancomycin (VANCOCIN)  IVPB 1000 mg/200 mL premix  Status:  Discontinued        1,000 mg 200 mL/hr over 60 Minutes Intravenous  Once 11/21/20 1909 11/21/20 1937        Subjective: Patient seen and examined at bedside.  Awake, hard of hearing.  Poor historian.  No overnight fever, vomiting, chest pain reported.  Complains of intermittent cough.  Objective: Vitals:   11/25/20 1643 11/25/20 2114 11/26/20 0343 11/26/20 0746  BP: (!) 126/55 112/60 (!) 118/52 (!) 126/56  Pulse: 69 73 62 61  Resp: 16 19  16   Temp: 98.3 F (36.8 C) 98.9 F (37.2 C) 98.2 F (36.8 C) 98.5 F (36.9 C)  TempSrc: Oral Oral Oral Oral  SpO2: 93% 92% 94% 95%  Weight:   75.1 kg     Intake/Output Summary (Last 24 hours) at 11/26/2020 1129 Last data filed at 11/26/2020 0958 Gross per 24 hour  Intake 1310 ml  Output 900 ml  Net 410 ml   Filed Weights   11/24/20 0356 11/25/20 0416 11/26/20 0343  Weight: 77.6 kg 75.7 kg 75.1 kg    Examination:  General exam: Appears calm and comfortable.  Elderly female lying in bed.  Currently on 2 L oxygen via nasal cannula.  Hard of hearing. Respiratory system: Bilateral decreased breath sounds at bases with scattered crackles Cardiovascular system: S1 & S2 heard, Rate controlled Gastrointestinal system: Abdomen is nondistended, soft and nontender. Normal bowel sounds heard. Extremities: No cyanosis, clubbing; trace lower extremity edema Central nervous system: Awake.  Slow to respond.  Poor historian.  No focal neurological deficits. Moving extremities Skin: No rashes, lesions or ulcers Psychiatry: Affect is mostly flat.   Data Reviewed: I have personally reviewed following labs and imaging studies  CBC: Recent Labs  Lab 11/21/20 1901 11/21/20 1940 11/22/20 0159 11/23/20 0137 11/24/20 0226  WBC 7.1  --  8.4 6.0 5.9  NEUTROABS 6.0  --   --   --   --   HGB 11.0* 11.2* 10.6* 8.7* 8.9*  HCT 33.4* 33.0* 32.5* 27.0* 27.2*  MCV 100.3*  --  102.2* 101.1* 101.1*  PLT PLATELET CLUMPS  NOTED ON SMEAR, UNABLE TO ESTIMATE  --  114* 114* 932*   Basic Metabolic Panel: Recent Labs  Lab 11/22/20 0159 11/23/20 0137 11/24/20 0226 11/25/20 0146 11/26/20 0214  NA 138 136 137 134* 129*  K 3.7 3.6 3.3* 3.5 3.5  CL 108 106 105 101 97*  CO2 22 22 23 23 24   GLUCOSE 106* 115* 121* 119* 120*  BUN 17 21 20 21  29*  CREATININE 0.96 1.10* 1.09* 1.14* 1.18*  CALCIUM 8.6* 8.2* 8.4* 8.5* 8.0*  MG  --  1.6*  --  1.5* 2.4   GFR: CrCl cannot be calculated (Unknown ideal weight.). Liver Function Tests: Recent Labs  Lab 11/21/20 1901  AST 15  ALT 11  ALKPHOS 98  BILITOT 1.2  PROT 6.0*  ALBUMIN 3.6   No results for input(s): LIPASE, AMYLASE in the last 168 hours. No results for input(s): AMMONIA in the last 168 hours. Coagulation Profile: Recent Labs  Lab 11/21/20  1901  INR 1.2   Cardiac Enzymes: No results for input(s): CKTOTAL, CKMB, CKMBINDEX, TROPONINI in the last 168 hours. BNP (last 3 results) No results for input(s): PROBNP in the last 8760 hours. HbA1C: No results for input(s): HGBA1C in the last 72 hours. CBG: No results for input(s): GLUCAP in the last 168 hours. Lipid Profile: No results for input(s): CHOL, HDL, LDLCALC, TRIG, CHOLHDL, LDLDIRECT in the last 72 hours. Thyroid Function Tests: No results for input(s): TSH, T4TOTAL, FREET4, T3FREE, THYROIDAB in the last 72 hours. Anemia Panel: No results for input(s): VITAMINB12, FOLATE, FERRITIN, TIBC, IRON, RETICCTPCT in the last 72 hours. Sepsis Labs: Recent Labs  Lab 11/21/20 1901 11/22/20 0159 11/23/20 0137 11/24/20 0226  PROCALCITON  --  3.43 4.96 2.73  LATICACIDVEN 1.0  --   --   --     Recent Results (from the past 240 hour(s))  Blood Culture (routine x 2)     Status: Abnormal   Collection Time: 11/21/20  7:21 PM   Specimen: BLOOD RIGHT ARM  Result Value Ref Range Status   Specimen Description BLOOD RIGHT ARM  Final   Special Requests   Final    BOTTLES DRAWN AEROBIC AND ANAEROBIC Blood  Culture adequate volume   Culture  Setup Time   Final    GRAM POSITIVE COCCI IN CLUSTERS AEROBIC BOTTLE ONLY CRITICAL RESULT CALLED TO, READ BACK BY AND VERIFIED WITH: PHARMD C AMEND AT 2119 11/23/2020 BY L BENFIELD    Culture (A)  Final    STAPHYLOCOCCUS HOMINIS THE SIGNIFICANCE OF ISOLATING THIS ORGANISM FROM A SINGLE SET OF BLOOD CULTURES WHEN MULTIPLE SETS ARE DRAWN IS UNCERTAIN. PLEASE NOTIFY THE MICROBIOLOGY DEPARTMENT WITHIN ONE WEEK IF SPECIATION AND SENSITIVITIES ARE REQUIRED. Performed at [facility], [place] [address], [place], [place] [address]    Report Status 11/25/2020 FINAL  Final  Blood Culture ID Panel (Reflexed)     Status: Abnormal   Collection Time: 11/21/20  7:21 PM  Result Value Ref Range Status   Enterococcus faecalis NOT DETECTED NOT DETECTED Final   Enterococcus Faecium NOT DETECTED NOT DETECTED Final   Listeria monocytogenes NOT DETECTED NOT DETECTED Final   Staphylococcus species DETECTED (A) NOT DETECTED Final    Comment: PHARMD C AMEND AT 8144 11/23/2020 BY L BENFIELD   Staphylococcus aureus (BCID) NOT DETECTED NOT DETECTED Final   Staphylococcus epidermidis NOT DETECTED NOT DETECTED Final   Staphylococcus lugdunensis NOT DETECTED NOT DETECTED Final   Streptococcus species NOT DETECTED NOT DETECTED Final   Streptococcus agalactiae NOT DETECTED NOT DETECTED Final   Streptococcus pneumoniae NOT DETECTED NOT DETECTED Final   Streptococcus pyogenes NOT DETECTED NOT DETECTED Final   A.calcoaceticus-baumannii NOT DETECTED NOT DETECTED Final   Bacteroides fragilis NOT DETECTED NOT DETECTED Final   Enterobacterales NOT DETECTED NOT DETECTED Final   Enterobacter cloacae complex NOT DETECTED NOT DETECTED Final   Escherichia coli NOT DETECTED NOT DETECTED Final   Klebsiella aerogenes NOT DETECTED NOT DETECTED Final   Klebsiella oxytoca NOT DETECTED NOT DETECTED Final   Klebsiella pneumoniae NOT DETECTED NOT DETECTED Final   Proteus species NOT DETECTED NOT  DETECTED Final   Salmonella species NOT DETECTED NOT DETECTED Final   Serratia marcescens NOT DETECTED NOT DETECTED Final   Haemophilus influenzae NOT DETECTED NOT DETECTED Final   Neisseria meningitidis NOT DETECTED NOT DETECTED Final   Pseudomonas aeruginosa NOT DETECTED NOT DETECTED Final   Stenotrophomonas maltophilia NOT DETECTED NOT DETECTED Final   Candida albicans NOT DETECTED  NOT DETECTED Final   Candida auris NOT DETECTED NOT DETECTED Final   Candida glabrata NOT DETECTED NOT DETECTED Final   Candida krusei NOT DETECTED NOT DETECTED Final   Candida parapsilosis NOT DETECTED NOT DETECTED Final   Candida tropicalis NOT DETECTED NOT DETECTED Final   Cryptococcus neoformans/gattii NOT DETECTED NOT DETECTED Final    Comment: Performed at [facility], [place] [address], [place], [place] [address]  Resp Panel by RT-PCR (Flu A&B, Covid) Nasopharyngeal Swab     Status: None   Collection Time: 11/21/20  7:30 PM   Specimen: Nasopharyngeal Swab; Nasopharyngeal(NP) swabs in vial transport medium  Result Value Ref Range Status   SARS Coronavirus 2 by RT PCR NEGATIVE NEGATIVE Final    Comment: (NOTE) SARS-CoV-2 target nucleic acids are NOT DETECTED.  The SARS-CoV-2 RNA is generally detectable in upper respiratory specimens during the acute phase of infection. The lowest concentration of SARS-CoV-2 viral copies this assay can detect is 138 copies/mL. A negative result does not preclude SARS-Cov-2 infection and should not be used as the sole basis for treatment or other patient management decisions. A negative result may occur with  improper specimen collection/handling, submission of specimen other than nasopharyngeal swab, presence of viral mutation(s) within the areas targeted by this assay, and inadequate number of viral copies(<138 copies/mL). A negative result must be combined with clinical observations, patient history, and epidemiological information. The expected result is  Negative.  Fact Sheet for Patients:  EntrepreneurPulse.com.au  Fact Sheet for Healthcare Providers:  IncredibleEmployment.be  This test is no t yet approved or cleared by the [place] FDA and  has been authorized for detection and/or diagnosis of SARS-CoV-2 by FDA under an Emergency Use Authorization (EUA). This EUA will remain  in effect (meaning this test can be used) for the duration of the COVID-19 declaration under Section 564(b)(1) of the Act, 21 U.S.C.section 360bbb-3(b)(1), unless the authorization is terminated  or revoked sooner.       Influenza A by PCR NEGATIVE NEGATIVE Final   Influenza B by PCR NEGATIVE NEGATIVE Final    Comment: (NOTE) The Xpert Xpress SARS-CoV-2/FLU/RSV plus assay is intended as an aid in the diagnosis of influenza from Nasopharyngeal swab specimens and should not be used as a sole basis for treatment. Nasal washings and aspirates are unacceptable for Xpert Xpress SARS-CoV-2/FLU/RSV testing.  Fact Sheet for Patients: EntrepreneurPulse.com.au  Fact Sheet for Healthcare Providers: IncredibleEmployment.be  This test is not yet approved or cleared by the [place] FDA and has been authorized for detection and/or diagnosis of SARS-CoV-2 by FDA under an Emergency Use Authorization (EUA). This EUA will remain in effect (meaning this test can be used) for the duration of the COVID-19 declaration under Section 564(b)(1) of the Act, 21 U.S.C. section 360bbb-3(b)(1), unless the authorization is terminated or revoked.  Performed at [facility], [place] [address], [place], [place] [address]   Blood Culture (routine x 2)     Status: None   Collection Time: 11/21/20  7:30 PM   Specimen: BLOOD LEFT WRIST  Result Value Ref Range Status   Specimen Description BLOOD LEFT WRIST  Final   Special Requests   Final    BOTTLES DRAWN AEROBIC AND ANAEROBIC Blood Culture results  may not be optimal due to an inadequate volume of blood received in culture bottles   Culture   Final    NO GROWTH 5 DAYS Performed at [facility], [place] [address] [place], [place]  [address]    Report Status 11/26/2020 FINAL  Final  Urine Culture     Status: None   Collection Time: 11/22/20  7:42 AM   Specimen: In/Out Cath Urine  Result Value Ref Range Status   Specimen Description IN/OUT CATH URINE  Final   Special Requests NONE  Final   Culture   Final    NO GROWTH Performed at [facility], [place] [address], [place], [place] [address]    Report Status 11/23/2020 FINAL  Final         Radiology Studies: No results found.      Scheduled Meds:  allopurinol  100 mg Oral Daily   enoxaparin (LOVENOX) injection  30 mg Subcutaneous QHS   famotidine  20 mg Oral BID   ferrous sulfate  325 mg Oral Daily   fluticasone furoate-vilanterol  1 puff Inhalation Daily   furosemide  40 mg Intravenous Daily   melatonin  3 mg Oral QHS   montelukast  10 mg Oral QHS   sertraline  50 mg Oral Daily   Continuous Infusions:        Aline August, MD [facility] 11/26/2020, 11:29 AM

## 2020-11-26 NOTE — TOC Progression Note (Addendum)
Transition of Care [facility]) - Progression Note    Patient Details  Name: [name] MRN: [id number] Date of Birth: 19-Dec-1927  [facility] CM/SW Contact  Jacalyn Lefevre Edson Snowball, RN Phone Number: 11/26/2020, 10:15 AM  Clinical Narrative:    Called patient's grand daughter [name] [phone]. Discussed bed offers. NCM emailed medicare.gov list of bed offers to her at : [email or web address]. Also left hard copy at patient's bedside.   [name] will review list and let NCM know decision. [name] has NCM direct cell and now email.   Patient has had her two covid vaccinations in March 2021 and 1 booster December 2021   Patient's daughter [name] unable to answer phone during business hours. Asked MD to call [name] after rounds for update.    1525 Followed up with [name] regarding bed offers. She stated she only got 3 out of the 6 offers email had cut off. NCM sent other three via email to [place] . [facility] has offered also. Emailed USAA.gov ratings. Spoke to [place] at [facility], if they decide on [place] patient would have a semi private room, and would need to get a covid booster before leaving the hospital.  [facility] would need to know today if family wants them. [name] aware and voiced understanding  Expected Discharge Plan: [place] Barriers to Discharge: Continued Medical Work up  Expected Discharge Plan and Services Expected Discharge Plan: [place]   Discharge Planning Services: CM Consult Post Acute Care Choice: [place] arrangements for the past 2 months: Single Family Home                 DME Arranged: N/A         HH Arranged: PT, RN [place] Agency: [place] Date [place]: 11/24/20 Time HH Agency Contacted: 48 Representative spoke with at [place]: [place],   Social Determinants of Health (SDOH) Interventions    Readmission Risk Interventions No  flowsheet data found.

## 2020-11-27 DIAGNOSIS — G9341 Metabolic encephalopathy: Secondary | ICD-10-CM | POA: Diagnosis not present

## 2020-11-27 DIAGNOSIS — I10 Essential (primary) hypertension: Secondary | ICD-10-CM

## 2020-11-27 DIAGNOSIS — J189 Pneumonia, unspecified organism: Secondary | ICD-10-CM | POA: Diagnosis not present

## 2020-11-27 DIAGNOSIS — I480 Paroxysmal atrial fibrillation: Secondary | ICD-10-CM

## 2020-11-27 DIAGNOSIS — I5032 Chronic diastolic (congestive) heart failure: Secondary | ICD-10-CM | POA: Diagnosis not present

## 2020-11-27 DIAGNOSIS — N1832 Chronic kidney disease, stage 3b: Secondary | ICD-10-CM | POA: Diagnosis not present

## 2020-11-27 LAB — CBC WITH DIFFERENTIAL/PLATELET
Abs Immature Granulocytes: 0.09 10*3/uL — ABNORMAL HIGH (ref 0.00–0.07)
Basophils Absolute: 0 10*3/uL (ref 0.0–0.1)
Basophils Relative: 0 %
Eosinophils Absolute: 0.2 10*3/uL (ref 0.0–0.5)
Eosinophils Relative: 3 %
HCT: 28.9 % — ABNORMAL LOW (ref 36.0–46.0)
Hemoglobin: 9.3 g/dL — ABNORMAL LOW (ref 12.0–15.0)
Immature Granulocytes: 1 %
Lymphocytes Relative: 20 %
Lymphs Abs: 1.3 10*3/uL (ref 0.7–4.0)
MCH: 32.5 pg (ref 26.0–34.0)
MCHC: 32.2 g/dL (ref 30.0–36.0)
MCV: 101 fL — ABNORMAL HIGH (ref 80.0–100.0)
Monocytes Absolute: 0.5 10*3/uL (ref 0.1–1.0)
Monocytes Relative: 8 %
Neutro Abs: 4.5 10*3/uL (ref 1.7–7.7)
Neutrophils Relative %: 68 %
Platelets: 192 10*3/uL (ref 150–400)
RBC: 2.86 MIL/uL — ABNORMAL LOW (ref 3.87–5.11)
RDW: 13.4 % (ref 11.5–15.5)
WBC: 6.6 10*3/uL (ref 4.0–10.5)
nRBC: 0 % (ref 0.0–0.2)

## 2020-11-27 LAB — SARS CORONAVIRUS 2 (TAT 6-24 HRS): SARS Coronavirus 2: NEGATIVE

## 2020-11-27 LAB — BASIC METABOLIC PANEL
Anion gap: 12 (ref 5–15)
BUN: 41 mg/dL — ABNORMAL HIGH (ref 8–23)
CO2: 23 mmol/L (ref 22–32)
Calcium: 8.2 mg/dL — ABNORMAL LOW (ref 8.9–10.3)
Chloride: 101 mmol/L (ref 98–111)
Creatinine, Ser: 1.35 mg/dL — ABNORMAL HIGH (ref 0.44–1.00)
GFR, Estimated: 37 mL/min — ABNORMAL LOW (ref >60)
Glucose, Bld: 115 mg/dL — ABNORMAL HIGH (ref 70–99)
Potassium: 3.5 mmol/L (ref 3.5–5.1)
Sodium: 136 mmol/L (ref 135–145)

## 2020-11-27 LAB — MAGNESIUM: Magnesium: 2 mg/dL (ref 1.7–2.4)

## 2020-11-27 NOTE — Progress Notes (Signed)
Patient ID: [name], female   DOB: Jun 15, 1927, 87 y.o.   MRN: [id number]  PROGRESS NOTE    [name]  [name] DOB: 1927-12-13 DOA: 11/21/2020 PCP: [name], MD   Brief Narrative:  88 y.o. female with medical history significant for chronic diastolic CHF (EF 58-52%), paroxysmal atrial fibrillation not on anticoagulation due to low A. fib burden, sinus node dysfunction s/p PPM, CKD stage IIIa, hypertension, asthma who presented to the ED for evaluation of confusion and fever/cough.  On presentation, she was febrile, tachypneic.  COVID and influenza tests were negative.  Chest x-ray showed possible right upper lobe pneumonia.  CT head was negative for acute intracranial abnormality.  She was started on IV fluids and antibiotics.  Patient has subsequently completed antibiotic treatment.  PT recommended SNF placement.  She is currently medically stable for discharge to SNF.  Assessment & Plan:   Community-acquired right upper lobe pneumonia with the possibility of aspiration pneumonia as well Hypoxia -Treated with Rocephin and Zithromax for 5 days. -Currently on room air.  Acute metabolic encephalopathy Possible undiagnosed dementia -Possibly from above.  CT of the head was negative for acute intracranial abnormality. -Monitor mental status.  Fall precautions.  Delirium precautions. -Still confused to time.  Possibly has undiagnosed dementia.  Might benefit from outpatient neurology evaluation and follow-up.  Recent distal right radius fracture with cast in place Frequent falls at home -Outpatient follow-up with [facility].  PT/OT recommending SNF placement.  TOC following  Chronic diastolic CHF -Strict input and output.  Daily weights.  Fluid restriction.    Outpatient follow-up with cardiology. -IV Lasix discontinued on 11/26/2020 because of hyponatremia.  Will resume oral Lasix possibly on discharge at a lower  dose.  Hyponatremia -Resolved  Paroxysmal A. fib -Not on anticoagulation due to low A. fib burden per cardiology notes.  Currently rate controlled.  Not on any rate or rhythm controlling medications.  Outpatient follow-up with cardiology  CKD stage IIIa -Chronic and stable.  Monitor intermittently  Sinus node dysfunction status post PPM -Outpatient follow-up with cardiology  Asthma -Continue Singulair, Breo Ellipta and albuterol as needed.  Anxiety/depression-continue sertraline  Iron deficiency anemia -Continue oral ferrous sulfate.  Hemoglobin stable  GERD -Continue famotidine  Essential hypertension -Blood pressure stable.  Amlodipine on hold  Possible history of gout -Continue allopurinol  DVT prophylaxis: Lovenox Code Status: DNR Family Communication: Spoke to granddaughter/Suzanne on phone on 11/26/20 Disposition Plan: Status is: Inpatient  Remains inpatient appropriate because: Will need SNF placement once bed is available Currently medically stable for discharge to SNF  Consultants: None  Procedures: None  Antimicrobials:  Anti-infectives (From admission, onward)    Start     Dose/Rate Route Frequency Ordered Stop   11/22/20 2000  cefTRIAXone (ROCEPHIN) 2 g in sodium chloride 0.9 % 100 mL IVPB        2 g 200 mL/hr over 30 Minutes Intravenous Every 24 hours 11/21/20 2326 11/25/20 2352   11/22/20 2000  azithromycin (ZITHROMAX) 500 mg in sodium chloride 0.9 % 250 mL IVPB        500 mg 250 mL/hr over 60 Minutes Intravenous Every 24 hours 11/21/20 2326 11/26/20 0026   11/21/20 1945  cefTRIAXone (ROCEPHIN) 1 g in sodium chloride 0.9 % 100 mL IVPB        1 g 200 mL/hr over 30 Minutes Intravenous  Once 11/21/20 1938 11/21/20 2045   11/21/20 1945  azithromycin (ZITHROMAX) 500 mg in sodium chloride 0.9 % 250 mL  IVPB        500 mg 250 mL/hr over 60 Minutes Intravenous  Once 11/21/20 1938 11/21/20 2109   11/21/20 1915  ceFEPIme (MAXIPIME) 2 g in sodium chloride  0.9 % 100 mL IVPB  Status:  Discontinued        2 g 200 mL/hr over 30 Minutes Intravenous  Once 11/21/20 1909 11/21/20 1937   11/21/20 1915  metroNIDAZOLE (FLAGYL) IVPB 500 mg  Status:  Discontinued        500 mg 100 mL/hr over 60 Minutes Intravenous  Once 11/21/20 1909 11/21/20 1937   11/21/20 1915  vancomycin (VANCOCIN) IVPB 1000 mg/200 mL premix  Status:  Discontinued        1,000 mg 200 mL/hr over 60 Minutes Intravenous  Once 11/21/20 1909 11/21/20 1937        Subjective: Patient seen and examined at bedside.  Awake, hard of hearing.  Poor historian.  Feels slightly better.  Cough improving.  Denies fever, chest pain or vomiting. Objective: Vitals:   11/26/20 2000 11/27/20 0416 11/27/20 0855 11/27/20 0857  BP: (!) 124/54 (!) 133/58 120/78   Pulse: 74 62 72   Resp: 17 17 19    Temp: 98.5 F (36.9 C) 98.2 F (36.8 C) 97.9 F (36.6 C)   TempSrc: Oral Oral Oral   SpO2: 95% 92% 93% (!) 64%  Weight:        Intake/Output Summary (Last 24 hours) at 11/27/2020 1027 Last data filed at 11/27/2020 0840 Gross per 24 hour  Intake 240 ml  Output 250 ml  Net -10 ml    Filed Weights   11/24/20 0356 11/25/20 0416 11/26/20 0343  Weight: 77.6 kg 75.7 kg 75.1 kg    Examination:  General exam: On room air.  No distress.  Elderly female lying in bed.   Hard of hearing. Respiratory system: Decreased breath sounds at bases bilaterally with some crackles  cardiovascular system: Rate controlled, S1-S2 heard gastrointestinal system: Abdomen is distended slightly, soft and nontender.  Bowel sounds are heard  extremities: Mild lower extremity edema present; no cyanosis  Central nervous system: Still very slow to respond; awake, slightly confused to time.  Poor historian.  No focal neurological deficits.  Moves extremities Skin: No obvious ecchymosis/lesions  psychiatry: Flat affect mostly   Data Reviewed: I have personally reviewed following labs and imaging studies  CBC: Recent Labs   Lab 11/21/20 1901 11/21/20 1940 11/22/20 0159 11/23/20 0137 11/24/20 0226 11/27/20 0136  WBC 7.1  --  8.4 6.0 5.9 6.6  NEUTROABS 6.0  --   --   --   --  4.5  HGB 11.0* 11.2* 10.6* 8.7* 8.9* 9.3*  HCT 33.4* 33.0* 32.5* 27.0* 27.2* 28.9*  MCV 100.3*  --  102.2* 101.1* 101.1* 101.0*  PLT PLATELET CLUMPS NOTED ON SMEAR, UNABLE TO ESTIMATE  --  114* 114* 128* 355    Basic Metabolic Panel: Recent Labs  Lab 11/23/20 0137 11/24/20 0226 11/25/20 0146 11/26/20 0214 11/27/20 0136  NA 136 137 134* 129* 136  K 3.6 3.3* 3.5 3.5 3.5  CL 106 105 101 97* 101  CO2 22 23 23 24 23   GLUCOSE 115* 121* 119* 120* 115*  BUN 21 20 21  29* 41*  CREATININE 1.10* 1.09* 1.14* 1.18* 1.35*  CALCIUM 8.2* 8.4* 8.5* 8.0* 8.2*  MG 1.6*  --  1.5* 2.4 2.0    GFR: CrCl cannot be calculated (Unknown ideal weight.). Liver Function Tests: Recent Labs  Lab 11/21/20 1901  AST  15  ALT 11  ALKPHOS 98  BILITOT 1.2  PROT 6.0*  ALBUMIN 3.6    No results for input(s): LIPASE, AMYLASE in the last 168 hours. No results for input(s): AMMONIA in the last 168 hours. Coagulation Profile: Recent Labs  Lab 11/21/20 1901  INR 1.2    Cardiac Enzymes: No results for input(s): CKTOTAL, CKMB, CKMBINDEX, TROPONINI in the last 168 hours. BNP (last 3 results) No results for input(s): PROBNP in the last 8760 hours. HbA1C: No results for input(s): HGBA1C in the last 72 hours. CBG: No results for input(s): GLUCAP in the last 168 hours. Lipid Profile: No results for input(s): CHOL, HDL, LDLCALC, TRIG, CHOLHDL, LDLDIRECT in the last 72 hours. Thyroid Function Tests: No results for input(s): TSH, T4TOTAL, FREET4, T3FREE, THYROIDAB in the last 72 hours. Anemia Panel: No results for input(s): VITAMINB12, FOLATE, FERRITIN, TIBC, IRON, RETICCTPCT in the last 72 hours. Sepsis Labs: Recent Labs  Lab 11/21/20 1901 11/22/20 0159 11/23/20 0137 11/24/20 0226  PROCALCITON  --  3.43 4.96 2.73  LATICACIDVEN 1.0  --   --    --      Recent Results (from the past 240 hour(s))  Blood Culture (routine x 2)     Status: Abnormal   Collection Time: 11/21/20  7:21 PM   Specimen: BLOOD RIGHT ARM  Result Value Ref Range Status   Specimen Description BLOOD RIGHT ARM  Final   Special Requests   Final    BOTTLES DRAWN AEROBIC AND ANAEROBIC Blood Culture adequate volume   Culture  Setup Time   Final    GRAM POSITIVE COCCI IN CLUSTERS AEROBIC BOTTLE ONLY CRITICAL RESULT CALLED TO, READ BACK BY AND VERIFIED WITH: PHARMD C AMEND AT 2694 11/23/2020 BY L BENFIELD    Culture (A)  Final    STAPHYLOCOCCUS HOMINIS THE SIGNIFICANCE OF ISOLATING THIS ORGANISM FROM A SINGLE SET OF BLOOD CULTURES WHEN MULTIPLE SETS ARE DRAWN IS UNCERTAIN. PLEASE NOTIFY THE MICROBIOLOGY DEPARTMENT WITHIN ONE WEEK IF SPECIATION AND SENSITIVITIES ARE REQUIRED. Performed at [facility], [place] [address], [place], [place] [address]    Report Status 11/25/2020 FINAL  Final  Blood Culture ID Panel (Reflexed)     Status: Abnormal   Collection Time: 11/21/20  7:21 PM  Result Value Ref Range Status   Enterococcus faecalis NOT DETECTED NOT DETECTED Final   Enterococcus Faecium NOT DETECTED NOT DETECTED Final   Listeria monocytogenes NOT DETECTED NOT DETECTED Final   Staphylococcus species DETECTED (A) NOT DETECTED Final    Comment: PHARMD C AMEND AT 7035 11/23/2020 BY L BENFIELD   Staphylococcus aureus (BCID) NOT DETECTED NOT DETECTED Final   Staphylococcus epidermidis NOT DETECTED NOT DETECTED Final   Staphylococcus lugdunensis NOT DETECTED NOT DETECTED Final   Streptococcus species NOT DETECTED NOT DETECTED Final   Streptococcus agalactiae NOT DETECTED NOT DETECTED Final   Streptococcus pneumoniae NOT DETECTED NOT DETECTED Final   Streptococcus pyogenes NOT DETECTED NOT DETECTED Final   A.calcoaceticus-baumannii NOT DETECTED NOT DETECTED Final   Bacteroides fragilis NOT DETECTED NOT DETECTED Final   Enterobacterales NOT DETECTED NOT DETECTED  Final   Enterobacter cloacae complex NOT DETECTED NOT DETECTED Final   Escherichia coli NOT DETECTED NOT DETECTED Final   Klebsiella aerogenes NOT DETECTED NOT DETECTED Final   Klebsiella oxytoca NOT DETECTED NOT DETECTED Final   Klebsiella pneumoniae NOT DETECTED NOT DETECTED Final   Proteus species NOT DETECTED NOT DETECTED Final   Salmonella species NOT DETECTED NOT DETECTED Final  Serratia marcescens NOT DETECTED NOT DETECTED Final   Haemophilus influenzae NOT DETECTED NOT DETECTED Final   Neisseria meningitidis NOT DETECTED NOT DETECTED Final   Pseudomonas aeruginosa NOT DETECTED NOT DETECTED Final   Stenotrophomonas maltophilia NOT DETECTED NOT DETECTED Final   Candida albicans NOT DETECTED NOT DETECTED Final   Candida auris NOT DETECTED NOT DETECTED Final   Candida glabrata NOT DETECTED NOT DETECTED Final   Candida krusei NOT DETECTED NOT DETECTED Final   Candida parapsilosis NOT DETECTED NOT DETECTED Final   Candida tropicalis NOT DETECTED NOT DETECTED Final   Cryptococcus neoformans/gattii NOT DETECTED NOT DETECTED Final    Comment: Performed at [facility], [place] [address] [place], [place] [address]  Resp Panel by RT-PCR (Flu A&B, Covid) Nasopharyngeal Swab     Status: None   Collection Time: 11/21/20  7:30 PM   Specimen: Nasopharyngeal Swab; Nasopharyngeal(NP) swabs in vial transport medium  Result Value Ref Range Status   SARS Coronavirus 2 by RT PCR NEGATIVE NEGATIVE Final    Comment: (NOTE) SARS-CoV-2 target nucleic acids are NOT DETECTED.  The SARS-CoV-2 RNA is generally detectable in upper respiratory specimens during the acute phase of infection. The lowest concentration of SARS-CoV-2 viral copies this assay can detect is 138 copies/mL. A negative result does not preclude SARS-Cov-2 infection and should not be used as the sole basis for treatment or other patient management decisions. A negative result may occur with  improper specimen collection/handling,  submission of specimen other than nasopharyngeal swab, presence of viral mutation(s) within the areas targeted by this assay, and inadequate number of viral copies(<138 copies/mL). A negative result must be combined with clinical observations, patient history, and epidemiological information. The expected result is Negative.  Fact Sheet for Patients:  EntrepreneurPulse.com.au  Fact Sheet for Healthcare Providers:  IncredibleEmployment.be  This test is no t yet approved or cleared by the [place] FDA and  has been authorized for detection and/or diagnosis of SARS-CoV-2 by FDA under an Emergency Use Authorization (EUA). This EUA will remain  in effect (meaning this test can be used) for the duration of the COVID-19 declaration under Section 564(b)(1) of the Act, 21 U.S.C.section 360bbb-3(b)(1), unless the authorization is terminated  or revoked sooner.       Influenza A by PCR NEGATIVE NEGATIVE Final   Influenza B by PCR NEGATIVE NEGATIVE Final    Comment: (NOTE) The Xpert Xpress SARS-CoV-2/FLU/RSV plus assay is intended as an aid in the diagnosis of influenza from Nasopharyngeal swab specimens and should not be used as a sole basis for treatment. Nasal washings and aspirates are unacceptable for Xpert Xpress SARS-CoV-2/FLU/RSV testing.  Fact Sheet for Patients: EntrepreneurPulse.com.au  Fact Sheet for Healthcare Providers: IncredibleEmployment.be  This test is not yet approved or cleared by the [place] FDA and has been authorized for detection and/or diagnosis of SARS-CoV-2 by FDA under an Emergency Use Authorization (EUA). This EUA will remain in effect (meaning this test can be used) for the duration of the COVID-19 declaration under Section 564(b)(1) of the Act, 21 U.S.C. section 360bbb-3(b)(1), unless the authorization is terminated or revoked.  Performed at [facility], [place] [address], [place], [place] [address]   Blood Culture (routine x 2)     Status: None   Collection Time: 11/21/20  7:30 PM   Specimen: BLOOD LEFT WRIST  Result Value Ref Range Status   Specimen Description BLOOD LEFT WRIST  Final   Special Requests   Final  BOTTLES DRAWN AEROBIC AND ANAEROBIC Blood Culture results may not be optimal due to an inadequate volume of blood received in culture bottles   Culture   Final    NO GROWTH 5 DAYS Performed at [facility], [place] [address] [place], [place] [address]    Report Status 11/26/2020 FINAL  Final  Urine Culture     Status: None   Collection Time: 11/22/20  7:42 AM   Specimen: In/Out Cath Urine  Result Value Ref Range Status   Specimen Description IN/OUT CATH URINE  Final   Special Requests NONE  Final   Culture   Final    NO GROWTH Performed at [facility], [place] [address], [place], [place] [address]    Report Status 11/23/2020 FINAL  Final          Radiology Studies: No results found.      Scheduled Meds:  allopurinol  100 mg Oral Daily   enoxaparin (LOVENOX) injection  30 mg Subcutaneous QHS   famotidine  20 mg Oral BID   ferrous sulfate  325 mg Oral Daily   fluticasone furoate-vilanterol  1 puff Inhalation Daily   melatonin  3 mg Oral QHS   montelukast  10 mg Oral QHS   sertraline  50 mg Oral Daily   Continuous Infusions:        Aline August, MD [facility] 11/27/2020, 10:27 AM

## 2020-11-27 NOTE — Care Management (Addendum)
[name]. Claiborne Billings at [employer] started [employer] today. Patient will need covid booster . Secure chatted MD and nurse.   [place] with [employer] requesting regular covid test to be ordered and done today.

## 2020-11-27 NOTE — Progress Notes (Signed)
Physical Therapy Treatment Patient Details Name: [name] MRN: [id number] DOB: [birth year] Today's Date: 11/27/2020   History of Present Illness 87 yo admitted 10/14 with confusion and fever with PNA. pt with Right wrist cast.  PMHx: CHF, PAF, PPM, CKD, HTN, asthma, gout, L TKA, Left hip fx    PT Comments    Pt admitted with above diagnosis. Pt was able to ambulate with min assist due to posterior lean and need for PT to steer RW at times. Pt still needs SNF for rehab as she needs to be more independent at home. Updated frequency to 2x week as this is the appropriate frequency for this pt.  Pt currently with functional limitations due to balance and endurance deficits. Pt will benefit from skilled PT to increase their independence and safety with mobility to allow discharge to the venue listed below.      Recommendations for follow up therapy are one component of a multi-disciplinary discharge planning process, led by the attending physician.  Recommendations may be updated based on patient status, additional functional criteria and insurance authorization.  Follow Up Recommendations  SNF;Supervision/Assistance - 24 hour     Equipment Recommendations  Hospital bed;Rolling walker with 5" wheels (RW with right platform attachment)    Recommendations for Other Services       Precautions / Restrictions Precautions Precautions: Fall Precaution Comments: Watch SpO2; HOH Required Braces or Orthoses: Splint/Cast Splint/Cast: right wrist cast - granddaughter reports that pt has had this for ~6 weeks now. Restrictions Other Position/Activity Restrictions: Assumed NWB for R wrist     Mobility  Bed Mobility               General bed mobility comments: Pt was in chair on arrival.    Transfers Overall transfer level: Needs assistance Equipment used: 1 person hand held assist;Right platform walker Transfers: Sit to/from Stand;Stand Pivot Transfers Sit to Stand: Min assist          General transfer comment: Min A for sit to stand from recliner with cues for safety and hand placement. Patient attempting to stand before therapist was ready. Slight LOB backwards needing steadying assist upon initial stand.  Ambulation/Gait Ambulation/Gait assistance: Min assist;Mod assist;+2 safety/equipment Gait Distance (Feet): 100 Feet Assistive device: Rolling walker (2 wheeled) Gait Pattern/deviations: Step-through pattern;Decreased stride length;Trunk flexed;Leaning posteriorly;Staggering right;Staggering left   Gait velocity interpretation: <1.8 ft/sec, indicate of risk for recurrent falls General Gait Details: assist to direct RW at times, Pt needed a lot of incr support with posterior lean at times with  RW.  Pt generally unsteady and needs max cues at times for safety. Pt did need right platform for RW for weight bearing and incr stabiltiy.  Pt limited by fatigue with SPO2 dropping to 89% on RA with gait.   Stairs             Wheelchair Mobility    Modified Rankin (Stroke Patients Only)       Balance Overall balance assessment: Needs assistance;History of Falls Sitting-balance support: No upper extremity supported;Feet supported Sitting balance-Leahy Scale: Fair Sitting balance - Comments: EOB and BSC   Standing balance support: Single extremity supported;No upper extremity supported;During functional activity Standing balance-Leahy Scale: Poor Standing balance comment: Reliant on at least one UE for support and external support and really needs bil UE support                            Cognition  Arousal/Alertness: Awake/alert Behavior During Therapy: WFL for tasks assessed/performed Overall Cognitive Status: Impaired/Different from baseline Area of Impairment: Following commands;Safety/judgement;Problem solving                 Orientation Level: Time     Following Commands: Follows one step commands with increased  time Safety/Judgement: Decreased awareness of deficits   Problem Solving: Slow processing;Requires verbal cues General Comments: Cues for attention to task. Patient very [place].      Exercises General Exercises - Lower Extremity Long Arc Quad: AROM;Both;10 reps;Seated    General Comments General comments (skin integrity, edema, etc.): VSS on RA with O2 89% and >      Pertinent Vitals/Pain Pain Assessment: Faces Faces Pain Scale: Hurts little more Pain Location: Buttocks Pain Descriptors / Indicators: Sore Pain Intervention(s): Limited activity within patient's tolerance;Monitored during session;Repositioned    Home Living                      Prior Function            PT Goals (current goals can now be found in the care plan section) Acute Rehab PT Goals Patient Stated Goal: "I'd like to stop coughing so bad." Progress towards PT goals: Progressing toward goals    Frequency    Min 2X/week      PT Plan Frequency needs to be updated    Co-evaluation              AM-PAC PT "6 Clicks" Mobility   Outcome Measure  Help needed turning from your back to your side while in a flat bed without using bedrails?: A Little Help needed moving from lying on your back to sitting on the side of a flat bed without using bedrails?: A Little Help needed moving to and from a bed to a chair (including a wheelchair)?: A Lot Help needed standing up from a chair using your arms (e.g., wheelchair or bedside chair)?: A Lot Help needed to walk in hospital room?: A Little Help needed climbing 3-5 steps with a railing? : A Lot 6 Click Score: 15    End of Session Equipment Utilized During Treatment: Gait belt;Oxygen Activity Tolerance: Patient tolerated treatment well Patient left: in chair;with call bell/phone within reach;with chair alarm set Nurse Communication: Mobility status PT Visit Diagnosis: Other abnormalities of gait and mobility (R26.89);History of falling  (Z91.81);Muscle weakness (generalized) (M62.81)     Time: 4174-0814 PT Time Calculation (min) (ACUTE ONLY): 16 min  Charges:  $Gait Training: 8-22 mins                     Jaslin Novitski M,PT Acute [place] [phone] (pager)    Alvira Philips 11/27/2020, 1:43 PM

## 2020-11-28 DIAGNOSIS — I5032 Chronic diastolic (congestive) heart failure: Secondary | ICD-10-CM | POA: Diagnosis not present

## 2020-11-28 DIAGNOSIS — J189 Pneumonia, unspecified organism: Secondary | ICD-10-CM | POA: Diagnosis not present

## 2020-11-28 DIAGNOSIS — G9341 Metabolic encephalopathy: Secondary | ICD-10-CM | POA: Diagnosis not present

## 2020-11-28 LAB — BASIC METABOLIC PANEL
Anion gap: 8 (ref 5–15)
BUN: 45 mg/dL — ABNORMAL HIGH (ref 8–23)
CO2: 24 mmol/L (ref 22–32)
Calcium: 8.4 mg/dL — ABNORMAL LOW (ref 8.9–10.3)
Chloride: 103 mmol/L (ref 98–111)
Creatinine, Ser: 1.46 mg/dL — ABNORMAL HIGH (ref 0.44–1.00)
GFR, Estimated: 34 mL/min — ABNORMAL LOW (ref >60)
Glucose, Bld: 124 mg/dL — ABNORMAL HIGH (ref 70–99)
Potassium: 3.7 mmol/L (ref 3.5–5.1)
Sodium: 135 mmol/L (ref 135–145)

## 2020-11-28 LAB — CBC WITH DIFFERENTIAL/PLATELET
Abs Immature Granulocytes: 0.08 10*3/uL — ABNORMAL HIGH (ref 0.00–0.07)
Basophils Absolute: 0 10*3/uL (ref 0.0–0.1)
Basophils Relative: 0 %
Eosinophils Absolute: 0.2 10*3/uL (ref 0.0–0.5)
Eosinophils Relative: 2 %
HCT: 28.3 % — ABNORMAL LOW (ref 36.0–46.0)
Hemoglobin: 9.1 g/dL — ABNORMAL LOW (ref 12.0–15.0)
Immature Granulocytes: 1 %
Lymphocytes Relative: 24 %
Lymphs Abs: 1.6 10*3/uL (ref 0.7–4.0)
MCH: 32.4 pg (ref 26.0–34.0)
MCHC: 32.2 g/dL (ref 30.0–36.0)
MCV: 100.7 fL — ABNORMAL HIGH (ref 80.0–100.0)
Monocytes Absolute: 0.6 10*3/uL (ref 0.1–1.0)
Monocytes Relative: 9 %
Neutro Abs: 4.3 10*3/uL (ref 1.7–7.7)
Neutrophils Relative %: 64 %
Platelets: 210 10*3/uL (ref 150–400)
RBC: 2.81 MIL/uL — ABNORMAL LOW (ref 3.87–5.11)
RDW: 13.3 % (ref 11.5–15.5)
WBC: 6.7 10*3/uL (ref 4.0–10.5)
nRBC: 0 % (ref 0.0–0.2)

## 2020-11-28 LAB — MAGNESIUM: Magnesium: 2 mg/dL (ref 1.7–2.4)

## 2020-11-28 MED ORDER — COVID-19MRNA BIVAL VAC MODERNA 50 MCG/0.5ML IM SUSP
0.5000 mL | Freq: Once | INTRAMUSCULAR | Status: AC
  Administered 2020-11-28: 0.5 mL via INTRAMUSCULAR
  Filled 2020-11-28: qty 0.5

## 2020-11-28 MED ORDER — FAMOTIDINE 20 MG PO TABS
20.0000 mg | ORAL_TABLET | Freq: Every day | ORAL | Status: DC
  Administered 2020-11-29 – 2020-12-01 (×3): 20 mg via ORAL
  Filled 2020-11-28 (×3): qty 1

## 2020-11-28 NOTE — Progress Notes (Signed)
Patient ID: [name], female   DOB: 12-27-1927, 87 y.o.   MRN: [id number]  PROGRESS NOTE    GLORIE DOWLEN  WUJ:[id number] DOB: Oct 08, 1927 DOA: 11/21/2020 PCP: [name], MD   Brief Narrative:  87 y.o. female with medical history significant for chronic diastolic CHF (EF 95-62%), paroxysmal atrial fibrillation not on anticoagulation due to low A. fib burden, sinus node dysfunction s/p PPM, CKD stage IIIa, hypertension, asthma who presented to the ED for evaluation of confusion and fever/cough.  On presentation, she was febrile, tachypneic.  COVID and influenza tests were negative.  Chest x-ray showed possible right upper lobe pneumonia.  CT head was negative for acute intracranial abnormality.  She was started on IV fluids and antibiotics.  Patient has subsequently completed antibiotic treatment.  PT recommended SNF placement.  She is currently medically stable for discharge to SNF.  Assessment & Plan:   Community-acquired right upper lobe pneumonia with the possibility of aspiration pneumonia as well Hypoxia -Treated with Rocephin and Zithromax for 5 days. -Currently on room air.  Acute metabolic encephalopathy Possible undiagnosed dementia -Possibly from above.  CT of the head was negative for acute intracranial abnormality. -Monitor mental status.  Fall precautions.  Delirium precautions. -Still confused to time.  Possibly has undiagnosed dementia.  Might benefit from outpatient neurology evaluation and follow-up.  Recent distal right radius fracture with cast in place Frequent falls at home -Outpatient follow-up with [facility].  PT/OT recommending SNF placement.  TOC following  Chronic diastolic CHF -Strict input and output.  Daily weights.  Fluid restriction.    Outpatient follow-up with cardiology. -IV Lasix discontinued on 11/26/2020 because of hyponatremia.  Keep Lasix on hold for now.  Hyponatremia -Resolved  Paroxysmal A. fib -Not on  anticoagulation due to low A. fib burden per cardiology notes.  Currently rate controlled.  Not on any rate or rhythm controlling medications.  Outpatient follow-up with cardiology  AKI on CKD stage IIIa -Creatinine gradually trending up, 1.46 today.  Keep Lasix on hold.  Repeat a.m. labs.  Sinus node dysfunction status post PPM -Outpatient follow-up with cardiology  Asthma -Continue Singulair, Breo Ellipta and albuterol as needed.  Anxiety/depression-continue sertraline  Iron deficiency anemia -Continue oral ferrous sulfate.  Hemoglobin stable  GERD -Continue famotidine  Essential hypertension -Blood pressure stable.  Amlodipine on hold  Possible history of gout -Continue allopurinol  DVT prophylaxis: Lovenox Code Status: DNR Family Communication: Spoke to granddaughter/Suzanne on phone on 11/26/20 Disposition Plan: Status is: Inpatient  Remains inpatient appropriate because: Will need SNF placement once bed is available Currently medically stable for discharge to SNF  Consultants: None  Procedures: None  Antimicrobials:  Anti-infectives (From admission, onward)    Start     Dose/Rate Route Frequency Ordered Stop   11/22/20 2000  cefTRIAXone (ROCEPHIN) 2 g in sodium chloride 0.9 % 100 mL IVPB        2 g 200 mL/hr over 30 Minutes Intravenous Every 24 hours 11/21/20 2326 11/25/20 2352   11/22/20 2000  azithromycin (ZITHROMAX) 500 mg in sodium chloride 0.9 % 250 mL IVPB        500 mg 250 mL/hr over 60 Minutes Intravenous Every 24 hours 11/21/20 2326 11/26/20 0026   11/21/20 1945  cefTRIAXone (ROCEPHIN) 1 g in sodium chloride 0.9 % 100 mL IVPB        1 g 200 mL/hr over 30 Minutes Intravenous  Once 11/21/20 1938 11/21/20 2045   11/21/20 1945  azithromycin (ZITHROMAX) 500 mg in  sodium chloride 0.9 % 250 mL IVPB        500 mg 250 mL/hr over 60 Minutes Intravenous  Once 11/21/20 1938 11/21/20 2109   11/21/20 1915  ceFEPIme (MAXIPIME) 2 g in sodium chloride 0.9 % 100 mL  IVPB  Status:  Discontinued        2 g 200 mL/hr over 30 Minutes Intravenous  Once 11/21/20 1909 11/21/20 1937   11/21/20 1915  metroNIDAZOLE (FLAGYL) IVPB 500 mg  Status:  Discontinued        500 mg 100 mL/hr over 60 Minutes Intravenous  Once 11/21/20 1909 11/21/20 1937   11/21/20 1915  vancomycin (VANCOCIN) IVPB 1000 mg/200 mL premix  Status:  Discontinued        1,000 mg 200 mL/hr over 60 Minutes Intravenous  Once 11/21/20 1909 11/21/20 1937        Subjective: Patient seen and examined at bedside.  Sleepy, wakes up only very slightly, hardly answers any questions.  Poor historian.  No overnight fever, vomiting, agitation reported. Objective: Vitals:   11/27/20 2040 11/28/20 0409 11/28/20 0811 11/28/20 0812  BP: (!) 138/52 129/69  (!) 132/54  Pulse: 70 60  63  Resp: 18 18  19   Temp: 99.9 F (37.7 C) 98.2 F (36.8 C)  98.5 F (36.9 C)  TempSrc: Axillary Oral  Oral  SpO2: 93% 93% 95% 95%  Weight:        Intake/Output Summary (Last 24 hours) at 11/28/2020 1046 Last data filed at 11/27/2020 1500 Gross per 24 hour  Intake 720 ml  Output --  Net 720 ml    Filed Weights   11/24/20 0356 11/25/20 0416 11/26/20 0343  Weight: 77.6 kg 75.7 kg 75.1 kg    Examination:  General exam: No acute distress.  On room air currently.  Elderly female lying in bed.  Sleepy, wakes up only very slightly, hardly answers any questions. Respiratory system: Bilateral decreased breath sounds at bases with some crackles  cardiovascular system: S1-S2 heard, rate controlled  gastrointestinal system: Abdomen is mildly distended, soft and nontender.  Normal bowel sounds heard  extremities: No clubbing; trace lower extremity edema present   Data Reviewed: I have personally reviewed following labs and imaging studies  CBC: Recent Labs  Lab 11/21/20 1901 11/21/20 1940 11/22/20 0159 11/23/20 0137 11/24/20 0226 11/27/20 0136 11/28/20 0145  WBC 7.1  --  8.4 6.0 5.9 6.6 6.7  NEUTROABS 6.0  --    --   --   --  4.5 4.3  HGB 11.0*   < > 10.6* 8.7* 8.9* 9.3* 9.1*  HCT 33.4*   < > 32.5* 27.0* 27.2* 28.9* 28.3*  MCV 100.3*  --  102.2* 101.1* 101.1* 101.0* 100.7*  PLT PLATELET CLUMPS NOTED ON SMEAR, UNABLE TO ESTIMATE  --  114* 114* 128* 192 210   < > = values in this interval not displayed.    Basic Metabolic Panel: Recent Labs  Lab 11/23/20 0137 11/24/20 0226 11/25/20 0146 11/26/20 0214 11/27/20 0136 11/28/20 0145  NA 136 137 134* 129* 136 135  K 3.6 3.3* 3.5 3.5 3.5 3.7  CL 106 105 101 97* 101 103  CO2 22 23 23 24 23 24   GLUCOSE 115* 121* 119* 120* 115* 124*  BUN 21 20 21  29* 41* 45*  CREATININE 1.10* 1.09* 1.14* 1.18* 1.35* 1.46*  CALCIUM 8.2* 8.4* 8.5* 8.0* 8.2* 8.4*  MG 1.6*  --  1.5* 2.4 2.0 2.0    GFR: CrCl cannot be calculated (  Unknown ideal weight.). Liver Function Tests: Recent Labs  Lab 11/21/20 1901  AST 15  ALT 11  ALKPHOS 98  BILITOT 1.2  PROT 6.0*  ALBUMIN 3.6    No results for input(s): LIPASE, AMYLASE in the last 168 hours. No results for input(s): AMMONIA in the last 168 hours. Coagulation Profile: Recent Labs  Lab 11/21/20 1901  INR 1.2    Cardiac Enzymes: No results for input(s): CKTOTAL, CKMB, CKMBINDEX, TROPONINI in the last 168 hours. BNP (last 3 results) No results for input(s): PROBNP in the last 8760 hours. HbA1C: No results for input(s): HGBA1C in the last 72 hours. CBG: No results for input(s): GLUCAP in the last 168 hours. Lipid Profile: No results for input(s): CHOL, HDL, LDLCALC, TRIG, CHOLHDL, LDLDIRECT in the last 72 hours. Thyroid Function Tests: No results for input(s): TSH, T4TOTAL, FREET4, T3FREE, THYROIDAB in the last 72 hours. Anemia Panel: No results for input(s): VITAMINB12, FOLATE, FERRITIN, TIBC, IRON, RETICCTPCT in the last 72 hours. Sepsis Labs: Recent Labs  Lab 11/21/20 1901 11/22/20 0159 11/23/20 0137 11/24/20 0226  PROCALCITON  --  3.43 4.96 2.73  LATICACIDVEN 1.0  --   --   --      Recent  Results (from the past 240 hour(s))  Blood Culture (routine x 2)     Status: Abnormal   Collection Time: 11/21/20  7:21 PM   Specimen: BLOOD RIGHT ARM  Result Value Ref Range Status   Specimen Description BLOOD RIGHT ARM  Final   Special Requests   Final    BOTTLES DRAWN AEROBIC AND ANAEROBIC Blood Culture adequate volume   Culture  Setup Time   Final    GRAM POSITIVE COCCI IN CLUSTERS AEROBIC BOTTLE ONLY CRITICAL RESULT CALLED TO, READ BACK BY AND VERIFIED WITH: PHARMD C AMEND AT 7209 11/23/2020 BY L BENFIELD    Culture (A)  Final    STAPHYLOCOCCUS HOMINIS THE SIGNIFICANCE OF ISOLATING THIS ORGANISM FROM A SINGLE SET OF BLOOD CULTURES WHEN MULTIPLE SETS ARE DRAWN IS UNCERTAIN. PLEASE NOTIFY THE MICROBIOLOGY DEPARTMENT WITHIN ONE WEEK IF SPECIATION AND SENSITIVITIES ARE REQUIRED. Performed at [facility], [place] [address], [place], [place] [address]    Report Status 11/25/2020 FINAL  Final  Blood Culture ID Panel (Reflexed)     Status: Abnormal   Collection Time: 11/21/20  7:21 PM  Result Value Ref Range Status   Enterococcus faecalis NOT DETECTED NOT DETECTED Final   Enterococcus Faecium NOT DETECTED NOT DETECTED Final   Listeria monocytogenes NOT DETECTED NOT DETECTED Final   Staphylococcus species DETECTED (A) NOT DETECTED Final    Comment: PHARMD C AMEND AT 2836 11/23/2020 BY L BENFIELD   Staphylococcus aureus (BCID) NOT DETECTED NOT DETECTED Final   Staphylococcus epidermidis NOT DETECTED NOT DETECTED Final   Staphylococcus lugdunensis NOT DETECTED NOT DETECTED Final   Streptococcus species NOT DETECTED NOT DETECTED Final   Streptococcus agalactiae NOT DETECTED NOT DETECTED Final   Streptococcus pneumoniae NOT DETECTED NOT DETECTED Final   Streptococcus pyogenes NOT DETECTED NOT DETECTED Final   A.calcoaceticus-baumannii NOT DETECTED NOT DETECTED Final   Bacteroides fragilis NOT DETECTED NOT DETECTED Final   Enterobacterales NOT DETECTED NOT DETECTED Final    Enterobacter cloacae complex NOT DETECTED NOT DETECTED Final   Escherichia coli NOT DETECTED NOT DETECTED Final   Klebsiella aerogenes NOT DETECTED NOT DETECTED Final   Klebsiella oxytoca NOT DETECTED NOT DETECTED Final   Klebsiella pneumoniae NOT DETECTED NOT DETECTED Final   Proteus species NOT DETECTED  NOT DETECTED Final   Salmonella species NOT DETECTED NOT DETECTED Final   Serratia marcescens NOT DETECTED NOT DETECTED Final   Haemophilus influenzae NOT DETECTED NOT DETECTED Final   Neisseria meningitidis NOT DETECTED NOT DETECTED Final   Pseudomonas aeruginosa NOT DETECTED NOT DETECTED Final   Stenotrophomonas maltophilia NOT DETECTED NOT DETECTED Final   Candida albicans NOT DETECTED NOT DETECTED Final   Candida auris NOT DETECTED NOT DETECTED Final   Candida glabrata NOT DETECTED NOT DETECTED Final   Candida krusei NOT DETECTED NOT DETECTED Final   Candida parapsilosis NOT DETECTED NOT DETECTED Final   Candida tropicalis NOT DETECTED NOT DETECTED Final   Cryptococcus neoformans/gattii NOT DETECTED NOT DETECTED Final    Comment: Performed at [facility], [address] [place], [place] [address]  Resp Panel by RT-PCR (Flu A&B, Covid) Nasopharyngeal Swab     Status: None   Collection Time: 11/21/20  7:30 PM   Specimen: Nasopharyngeal Swab; Nasopharyngeal(NP) swabs in vial transport medium  Result Value Ref Range Status   SARS Coronavirus 2 by RT PCR NEGATIVE NEGATIVE Final    Comment: (NOTE) SARS-CoV-2 target nucleic acids are NOT DETECTED.  The SARS-CoV-2 RNA is generally detectable in upper respiratory specimens during the acute phase of infection. The lowest concentration of SARS-CoV-2 viral copies this assay can detect is 138 copies/mL. A negative result does not preclude SARS-Cov-2 infection and should not be used as the sole basis for treatment or other patient management decisions. A negative result may occur with  improper specimen collection/handling,  submission of specimen other than nasopharyngeal swab, presence of viral mutation(s) within the areas targeted by this assay, and inadequate number of viral copies(<138 copies/mL). A negative result must be combined with clinical observations, patient history, and epidemiological information. The expected result is Negative.  Fact Sheet for Patients:  EntrepreneurPulse.com.au  Fact Sheet for Healthcare Providers:  IncredibleEmployment.be  This test is no t yet approved or cleared by the [place] FDA and  has been authorized for detection and/or diagnosis of SARS-CoV-2 by FDA under an Emergency Use Authorization (EUA). This EUA will remain  in effect (meaning this test can be used) for the duration of the COVID-19 declaration under Section 564(b)(1) of the Act, 21 U.S.C.section 360bbb-3(b)(1), unless the authorization is terminated  or revoked sooner.       Influenza A by PCR NEGATIVE NEGATIVE Final   Influenza B by PCR NEGATIVE NEGATIVE Final    Comment: (NOTE) The Xpert Xpress SARS-CoV-2/FLU/RSV plus assay is intended as an aid in the diagnosis of influenza from Nasopharyngeal swab specimens and should not be used as a sole basis for treatment. Nasal washings and aspirates are unacceptable for Xpert Xpress SARS-CoV-2/FLU/RSV testing.  Fact Sheet for Patients: EntrepreneurPulse.com.au  Fact Sheet for Healthcare Providers: IncredibleEmployment.be  This test is not yet approved or cleared by the [place] FDA and has been authorized for detection and/or diagnosis of SARS-CoV-2 by FDA under an Emergency Use Authorization (EUA). This EUA will remain in effect (meaning this test can be used) for the duration of the COVID-19 declaration under Section 564(b)(1) of the Act, 21 U.S.C. section 360bbb-3(b)(1), unless the authorization is terminated or revoked.  Performed at [facility], [place] [address] [place], [place] [address]   Blood Culture (routine x 2)     Status: None   Collection Time: 11/21/20  7:30 PM   Specimen: BLOOD LEFT WRIST  Result Value Ref Range Status   Specimen Description  BLOOD LEFT WRIST  Final   Special Requests   Final    BOTTLES DRAWN AEROBIC AND ANAEROBIC Blood Culture results may not be optimal due to an inadequate volume of blood received in culture bottles   Culture   Final    NO GROWTH 5 DAYS Performed at [facility], [place] [address] [place], [place] [address]    Report Status 11/26/2020 FINAL  Final  Urine Culture     Status: None   Collection Time: 11/22/20  7:42 AM   Specimen: In/Out Cath Urine  Result Value Ref Range Status   Specimen Description IN/OUT CATH URINE  Final   Special Requests NONE  Final   Culture   Final    NO GROWTH Performed at [facility], [place] [address] [place], [place] [address]    Report Status 11/23/2020 FINAL  Final  SARS CORONAVIRUS 2 (TAT 6-24 HRS) Nasopharyngeal Nasopharyngeal Swab     Status: None   Collection Time: 11/27/20  3:00 PM   Specimen: Nasopharyngeal Swab  Result Value Ref Range Status   SARS Coronavirus 2 NEGATIVE NEGATIVE Final    Comment: (NOTE) SARS-CoV-2 target nucleic acids are NOT DETECTED.  The SARS-CoV-2 RNA is generally detectable in upper and lower respiratory specimens during the acute phase of infection. Negative results do not preclude SARS-CoV-2 infection, do not rule out co-infections with other pathogens, and should not be used as the sole basis for treatment or other patient management decisions. Negative results must be combined with clinical observations, patient history, and epidemiological information. The expected result is Negative.  Fact Sheet for Patients: SugarRoll.be  Fact Sheet for Healthcare Providers: https://www.woods-mathews.com/  This test is not yet approved or cleared by the [place] FDA and   has been authorized for detection and/or diagnosis of SARS-CoV-2 by FDA under an Emergency Use Authorization (EUA). This EUA will remain  in effect (meaning this test can be used) for the duration of the COVID-19 declaration under Se ction 564(b)(1) of the Act, 21 U.S.C. section 360bbb-3(b)(1), unless the authorization is terminated or revoked sooner.  Performed at [facility], [place] [address], [place],  [address]           Radiology Studies: No results found.      Scheduled Meds:  allopurinol  100 mg Oral Daily   COVID-19 mRNA bivalent vaccine (Moderna)  0.5 mL Intramuscular Once   enoxaparin (LOVENOX) injection  30 mg Subcutaneous QHS   famotidine  20 mg Oral BID   ferrous sulfate  325 mg Oral Daily   fluticasone furoate-vilanterol  1 puff Inhalation Daily   melatonin  3 mg Oral QHS   montelukast  10 mg Oral QHS   sertraline  50 mg Oral Daily   Continuous Infusions:        Aline August, MD [facility] 11/28/2020, 10:46 AM

## 2020-11-28 NOTE — Progress Notes (Signed)
Occupational Therapy Treatment Patient Details Name: [name] MRN: [id number] DOB: [birth year] Today's Date: 11/28/2020   History of present illness 87 yo admitted 10/14 with confusion and fever with PNA. pt with Right wrist cast.  PMHx: CHF, PAF, PPM, CKD, HTN, asthma, gout, L TKA, Left hip fx   OT comments  Pt progressing towards acute OT goals. Pt able to walk to/from bathroom , complete toileting needs, 1 grooming task standing at sink. Pt needs up to min A and use of right platform walker. Up to recliner at end of session. D/c plan remains appropriate.    Recommendations for follow up therapy are one component of a multi-disciplinary discharge planning process, led by the attending physician.  Recommendations may be updated based on patient status, additional functional criteria and insurance authorization.    Follow Up Recommendations  SNF    Equipment Recommendations  Other (comment) (defer to next venue)    Recommendations for Other Services      Precautions / Restrictions Precautions Precautions: Fall Precaution Comments: Watch SpO2; HOH Required Braces or Orthoses: Splint/Cast Splint/Cast: right wrist cast - granddaughter reports that pt has had this for ~6 weeks now. Restrictions Weight Bearing Restrictions: No Other Position/Activity Restrictions: Assumed NWB for R wrist       Mobility Bed Mobility Overal bed mobility: Needs Assistance Bed Mobility: Supine to Sit     Supine to sit: Supervision;HOB elevated     General bed mobility comments: supervision for safety    Transfers Overall transfer level: Needs assistance Equipment used: Right platform walker Transfers: Sit to/from Stand Sit to Stand: Min assist         General transfer comment: assist to stabilize walker and steady pt. to/from EOB, comfort height toilet, and recliner    Balance Overall balance assessment: Needs assistance;History of Falls Sitting-balance support: No upper  extremity supported;Feet supported Sitting balance-Leahy Scale: Fair     Standing balance support: Single extremity supported;No upper extremity supported;During functional activity Standing balance-Leahy Scale: Poor Standing balance comment: needs external support                           ADL either performed or assessed with clinical judgement   ADL Overall ADL's : Needs assistance/impaired                         Toilet Transfer: Minimal assistance;Ambulation;Comfort height toilet;Grab bars;RW Armed forces technical officer Details (indicate cue type and reason): R platform rw         Functional mobility during ADLs: Minimal assistance;Rolling walker (R platform walker) General ADL Comments: Pt able to walk to bathoom and complete toileting tasks. up to min A needed for transfers and mobility     Vision       Perception     Praxis      Cognition Arousal/Alertness: Awake/alert Behavior During Therapy: WFL for tasks assessed/performed Overall Cognitive Status: Impaired/Different from baseline Area of Impairment: Following commands;Safety/judgement;Problem solving                                        Exercises     Shoulder Instructions       General Comments VSS on RA    Pertinent Vitals/ Pain       Pain Assessment: Faces Faces Pain Scale: Hurts little more Pain Location: unspecified Pain  Descriptors / Indicators: Grimacing Pain Intervention(s): Monitored during session  Home Living                                          Prior Functioning/Environment              Frequency  Min 2X/week        Progress Toward Goals  OT Goals(current goals can now be found in the care plan section)  Progress towards OT goals: Progressing toward goals  Acute Rehab OT Goals Patient Stated Goal: "I'd like to stop coughing so bad." OT Goal Formulation: With patient Time For Goal Achievement: 12/08/20 Potential to  Achieve Goals: Good ADL Goals Pt Will Perform Grooming: with min guard assist;standing Pt Will Perform Lower Body Dressing: with min assist;sit to/from stand Pt Will Transfer to Toilet: with min assist;bedside commode;ambulating Pt Will Perform Toileting - Clothing Manipulation and hygiene: sit to/from stand;sitting/lateral leans;with supervision  Plan Discharge plan remains appropriate    Co-evaluation                 AM-PAC OT "6 Clicks" Daily Activity     Outcome Measure   Help from another person eating meals?: None Help from another person taking care of personal grooming?: A Little Help from another person toileting, which includes using toliet, bedpan, or urinal?: A Little Help from another person bathing (including washing, rinsing, drying)?: A Lot Help from another person to put on and taking off regular upper body clothing?: A Little Help from another person to put on and taking off regular lower body clothing?: A Lot 6 Click Score: 17    End of Session Equipment Utilized During Treatment: Other (comment) (R platform walker)  OT Visit Diagnosis: Unsteadiness on feet (R26.81);Other abnormalities of gait and mobility (R26.89);Muscle weakness (generalized) (M62.81)   Activity Tolerance Patient tolerated treatment well;Patient limited by fatigue   Patient Left in chair;with call bell/phone within reach;with chair alarm set   Nurse Communication          Time: 8325-4982 OT Time Calculation (min): 19 min  Charges: OT General Charges $OT Visit: 1 Visit OT Treatments $Self Care/Home Management : 8-22 mins  Tyrone Schimke, OT Acute Rehabilitation Services Pager: [phone] Office: [phone]   Hortencia Pilar 11/28/2020, 1:22 PM

## 2020-11-29 DIAGNOSIS — J690 Pneumonitis due to inhalation of food and vomit: Secondary | ICD-10-CM

## 2020-11-29 DIAGNOSIS — I10 Essential (primary) hypertension: Secondary | ICD-10-CM | POA: Diagnosis not present

## 2020-11-29 DIAGNOSIS — N1832 Chronic kidney disease, stage 3b: Secondary | ICD-10-CM | POA: Diagnosis not present

## 2020-11-29 DIAGNOSIS — G9341 Metabolic encephalopathy: Secondary | ICD-10-CM | POA: Diagnosis not present

## 2020-11-29 LAB — BASIC METABOLIC PANEL
Anion gap: 7 (ref 5–15)
BUN: 40 mg/dL — ABNORMAL HIGH (ref 8–23)
CO2: 24 mmol/L (ref 22–32)
Calcium: 8.7 mg/dL — ABNORMAL LOW (ref 8.9–10.3)
Chloride: 104 mmol/L (ref 98–111)
Creatinine, Ser: 1.26 mg/dL — ABNORMAL HIGH (ref 0.44–1.00)
GFR, Estimated: 40 mL/min — ABNORMAL LOW (ref >60)
Glucose, Bld: 103 mg/dL — ABNORMAL HIGH (ref 70–99)
Potassium: 3.9 mmol/L (ref 3.5–5.1)
Sodium: 135 mmol/L (ref 135–145)

## 2020-11-29 LAB — CBC WITH DIFFERENTIAL/PLATELET
Abs Immature Granulocytes: 0.07 10*3/uL (ref 0.00–0.07)
Basophils Absolute: 0 10*3/uL (ref 0.0–0.1)
Basophils Relative: 0 %
Eosinophils Absolute: 0.1 10*3/uL (ref 0.0–0.5)
Eosinophils Relative: 3 %
HCT: 29 % — ABNORMAL LOW (ref 36.0–46.0)
Hemoglobin: 9.2 g/dL — ABNORMAL LOW (ref 12.0–15.0)
Immature Granulocytes: 1 %
Lymphocytes Relative: 30 %
Lymphs Abs: 1.5 10*3/uL (ref 0.7–4.0)
MCH: 32.5 pg (ref 26.0–34.0)
MCHC: 31.7 g/dL (ref 30.0–36.0)
MCV: 102.5 fL — ABNORMAL HIGH (ref 80.0–100.0)
Monocytes Absolute: 0.4 10*3/uL (ref 0.1–1.0)
Monocytes Relative: 8 %
Neutro Abs: 3 10*3/uL (ref 1.7–7.7)
Neutrophils Relative %: 58 %
Platelets: 227 10*3/uL (ref 150–400)
RBC: 2.83 MIL/uL — ABNORMAL LOW (ref 3.87–5.11)
RDW: 13.2 % (ref 11.5–15.5)
WBC: 5.1 10*3/uL (ref 4.0–10.5)
nRBC: 0 % (ref 0.0–0.2)

## 2020-11-29 LAB — MAGNESIUM: Magnesium: 2 mg/dL (ref 1.7–2.4)

## 2020-11-29 NOTE — Plan of Care (Signed)

## 2020-11-29 NOTE — Plan of Care (Signed)

## 2020-11-29 NOTE — Progress Notes (Signed)
Patient ID: [name], female   DOB: [birth year], 87 y.o.   MRN: [id number]  PROGRESS NOTE    [name]  NID:[id number] DOB: 05-05-1927 DOA: 11/21/2020 PCP: [name], MD   Brief Narrative:  87 y.o. female with medical history significant for chronic diastolic CHF (EF 14-43%), paroxysmal atrial fibrillation not on anticoagulation due to low A. fib burden, sinus node dysfunction s/p PPM, CKD stage IIIa, hypertension, asthma who presented to the ED for evaluation of confusion and fever/cough.  On presentation, she was febrile, tachypneic.  COVID and influenza tests were negative.  Chest x-ray showed possible right upper lobe pneumonia.  CT head was negative for acute intracranial abnormality.  She was started on IV fluids and antibiotics.  Patient has subsequently completed antibiotic treatment.  PT recommended SNF placement.  She is currently medically stable for discharge to SNF.  10/22- no overnight issues.  Insurance authorization pending would likely be on Monday per social work  Assessment & Plan:   Community-acquired right upper lobe pneumonia with the possibility of aspiration pneumonia as well Hypoxia 10/22 treated with Rocephin and Zithromax for 5 days On room air    Acute metabolic encephalopathy Possible undiagnosed dementia -Possibly from above.  CT of the head was negative for acute intracranial abnormality. -Monitor mental status.  Fall precautions.  Delirium precautions. -Still confused to time.   10/22 possibly has undiagnosed dementia.   Might benefit from outpatient neurology evaluation and follow-up     Recent distal right radius fracture with cast in place Frequent falls at home 10/22 PT OT recommend SNF placement.  Insurance Auth pending likely Monday    Chronic diastolic CHF -Strict input and output.  Daily weights.  Fluid restriction.    Outpatient follow-up with cardiology. -IV Lasix discontinued on 11/26/2020 because of  hyponatremia.   10/22 euvolemic on exam.  Continue to hold Lasix     Hyponatremia -Resolved  Paroxysmal A. fib -Not on anticoagulation due to low A. fib burden per cardiology notes.  Currently rate controlled.  Not on any rate or rhythm controlling medications.  Outpatient follow-up with cardiology  AKI on CKD stage IIIa -Creatinine gradually trending down.  Continue to hold Lasix.  Creatinine 1.26 today     Sinus node dysfunction status post PPM -Outpatient follow-up with cardiology  Asthma -Continue Singulair, Breo Ellipta and albuterol as needed.  Anxiety/depression-continue sertraline  Iron deficiency anemia -Continue oral ferrous sulfate.  Hemoglobin stable  GERD -Continue famotidine  Essential hypertension -Blood pressure stable.  Amlodipine on hold  Possible history of gout -Continue allopurinol  DVT prophylaxis: Lovenox Code Status: DNR Family Communication: None at bedside  disposition Plan: SNF Status is: Inpatient  Remains inpatient appropriate because: Will need SNF placement once bed is available.  [employer] fast streamer pending Currently medically stable for discharge to SNF  Consultants: None  Procedures: None  Antimicrobials:  Anti-infectives (From admission, onward)    Start     Dose/Rate Route Frequency Ordered Stop   11/22/20 2000  cefTRIAXone (ROCEPHIN) 2 g in sodium chloride 0.9 % 100 mL IVPB        2 g 200 mL/hr over 30 Minutes Intravenous Every 24 hours 11/21/20 2326 11/25/20 2352   11/22/20 2000  azithromycin (ZITHROMAX) 500 mg in sodium chloride 0.9 % 250 mL IVPB        500 mg 250 mL/hr over 60 Minutes Intravenous Every 24 hours 11/21/20 2326 11/26/20 0026   11/21/20 1945  cefTRIAXone (ROCEPHIN) 1 g in sodium chloride  0.9 % 100 mL IVPB        1 g 200 mL/hr over 30 Minutes Intravenous  Once 11/21/20 1938 11/21/20 2045   11/21/20 1945  azithromycin (ZITHROMAX) 500 mg in sodium chloride 0.9 % 250 mL IVPB        500 mg 250 mL/hr over 60  Minutes Intravenous  Once 11/21/20 1938 11/21/20 2109   11/21/20 1915  ceFEPIme (MAXIPIME) 2 g in sodium chloride 0.9 % 100 mL IVPB  Status:  Discontinued        2 g 200 mL/hr over 30 Minutes Intravenous  Once 11/21/20 1909 11/21/20 1937   11/21/20 1915  metroNIDAZOLE (FLAGYL) IVPB 500 mg  Status:  Discontinued        500 mg 100 mL/hr over 60 Minutes Intravenous  Once 11/21/20 1909 11/21/20 1937   11/21/20 1915  vancomycin (VANCOCIN) IVPB 1000 mg/200 mL premix  Status:  Discontinued        1,000 mg 200 mL/hr over 60 Minutes Intravenous  Once 11/21/20 1909 11/21/20 1937        Subjective: Patient pleasant.  Denies shortness of breath or chest pain or abdominal pain   Objective: Vitals:   11/28/20 0812 11/28/20 1723 11/28/20 2016 11/29/20 0427  BP: (!) 132/54 (!) 138/58 120/63 (!) 125/57  Pulse: 63 63 (!) 59 60  Resp: 19 17 18 18   Temp: 98.5 F (36.9 C) 98.7 F (37.1 C) 98.4 F (36.9 C) 97.7 F (36.5 C)  TempSrc: Oral Oral Oral Oral  SpO2: 95% 98% 95% 95%  Weight:       No intake or output data in the 24 hours ending 11/29/20 0829 Filed Weights   11/24/20 0356 11/25/20 0416 11/26/20 0343  Weight: 77.6 kg 75.7 kg 75.1 kg    Examination: Pleasant, calm CTA no wheeze rales Regular S1-S2 no gallops Soft benign positive bowel sounds No edema Grossly intact Mood and affect appropriate in current setting  Data Reviewed: I have personally reviewed following labs and imaging studies  CBC: Recent Labs  Lab 11/23/20 0137 11/24/20 0226 11/27/20 0136 11/28/20 0145 11/29/20 0111  WBC 6.0 5.9 6.6 6.7 5.1  NEUTROABS  --   --  4.5 4.3 3.0  HGB 8.7* 8.9* 9.3* 9.1* 9.2*  HCT 27.0* 27.2* 28.9* 28.3* 29.0*  MCV 101.1* 101.1* 101.0* 100.7* 102.5*  PLT 114* 128* 192 210 403   Basic Metabolic Panel: Recent Labs  Lab 11/25/20 0146 11/26/20 0214 11/27/20 0136 11/28/20 0145 11/29/20 0111  NA 134* 129* 136 135 135  K 3.5 3.5 3.5 3.7 3.9  CL 101 97* 101 103 104  CO2 23  24 23 24 24   GLUCOSE 119* 120* 115* 124* 103*  BUN 21 29* 41* 45* 40*  CREATININE 1.14* 1.18* 1.35* 1.46* 1.26*  CALCIUM 8.5* 8.0* 8.2* 8.4* 8.7*  MG 1.5* 2.4 2.0 2.0 2.0   GFR: CrCl cannot be calculated (Unknown ideal weight.). Liver Function Tests: No results for input(s): AST, ALT, ALKPHOS, BILITOT, PROT, ALBUMIN in the last 168 hours. No results for input(s): LIPASE, AMYLASE in the last 168 hours. No results for input(s): AMMONIA in the last 168 hours. Coagulation Profile: No results for input(s): INR, PROTIME in the last 168 hours. Cardiac Enzymes: No results for input(s): CKTOTAL, CKMB, CKMBINDEX, TROPONINI in the last 168 hours. BNP (last 3 results) No results for input(s): PROBNP in the last 8760 hours. HbA1C: No results for input(s): HGBA1C in the last 72 hours. CBG: No results for input(s): GLUCAP in  the last 168 hours. Lipid Profile: No results for input(s): CHOL, HDL, LDLCALC, TRIG, CHOLHDL, LDLDIRECT in the last 72 hours. Thyroid Function Tests: No results for input(s): TSH, T4TOTAL, FREET4, T3FREE, THYROIDAB in the last 72 hours. Anemia Panel: No results for input(s): VITAMINB12, FOLATE, FERRITIN, TIBC, IRON, RETICCTPCT in the last 72 hours. Sepsis Labs: Recent Labs  Lab 11/23/20 0137 11/24/20 0226  PROCALCITON 4.96 2.73    Recent Results (from the past 240 hour(s))  Blood Culture (routine x 2)     Status: Abnormal   Collection Time: 11/21/20  7:21 PM   Specimen: BLOOD RIGHT ARM  Result Value Ref Range Status   Specimen Description BLOOD RIGHT ARM  Final   Special Requests   Final    BOTTLES DRAWN AEROBIC AND ANAEROBIC Blood Culture adequate volume   Culture  Setup Time   Final    GRAM POSITIVE COCCI IN CLUSTERS AEROBIC BOTTLE ONLY CRITICAL RESULT CALLED TO, READ BACK BY AND VERIFIED WITH: PHARMD C AMEND AT 1610 11/23/2020 BY L BENFIELD    Culture (A)  Final    STAPHYLOCOCCUS HOMINIS THE SIGNIFICANCE OF ISOLATING THIS ORGANISM FROM A SINGLE SET OF  BLOOD CULTURES WHEN MULTIPLE SETS ARE DRAWN IS UNCERTAIN. PLEASE NOTIFY THE MICROBIOLOGY DEPARTMENT WITHIN ONE WEEK IF SPECIATION AND SENSITIVITIES ARE REQUIRED. Performed at [facility], [place] [address], [place], [place] [address]    Report Status 11/25/2020 FINAL  Final  Blood Culture ID Panel (Reflexed)     Status: Abnormal   Collection Time: 11/21/20  7:21 PM  Result Value Ref Range Status   Enterococcus faecalis NOT DETECTED NOT DETECTED Final   Enterococcus Faecium NOT DETECTED NOT DETECTED Final   Listeria monocytogenes NOT DETECTED NOT DETECTED Final   Staphylococcus species DETECTED (A) NOT DETECTED Final    Comment: PHARMD C AMEND AT 4098 11/23/2020 BY L BENFIELD   Staphylococcus aureus (BCID) NOT DETECTED NOT DETECTED Final   Staphylococcus epidermidis NOT DETECTED NOT DETECTED Final   Staphylococcus lugdunensis NOT DETECTED NOT DETECTED Final   Streptococcus species NOT DETECTED NOT DETECTED Final   Streptococcus agalactiae NOT DETECTED NOT DETECTED Final   Streptococcus pneumoniae NOT DETECTED NOT DETECTED Final   Streptococcus pyogenes NOT DETECTED NOT DETECTED Final   A.calcoaceticus-baumannii NOT DETECTED NOT DETECTED Final   Bacteroides fragilis NOT DETECTED NOT DETECTED Final   Enterobacterales NOT DETECTED NOT DETECTED Final   Enterobacter cloacae complex NOT DETECTED NOT DETECTED Final   Escherichia coli NOT DETECTED NOT DETECTED Final   Klebsiella aerogenes NOT DETECTED NOT DETECTED Final   Klebsiella oxytoca NOT DETECTED NOT DETECTED Final   Klebsiella pneumoniae NOT DETECTED NOT DETECTED Final   Proteus species NOT DETECTED NOT DETECTED Final   Salmonella species NOT DETECTED NOT DETECTED Final   Serratia marcescens NOT DETECTED NOT DETECTED Final   Haemophilus influenzae NOT DETECTED NOT DETECTED Final   Neisseria meningitidis NOT DETECTED NOT DETECTED Final   Pseudomonas aeruginosa NOT DETECTED NOT DETECTED Final   Stenotrophomonas maltophilia NOT  DETECTED NOT DETECTED Final   Candida albicans NOT DETECTED NOT DETECTED Final   Candida auris NOT DETECTED NOT DETECTED Final   Candida glabrata NOT DETECTED NOT DETECTED Final   Candida krusei NOT DETECTED NOT DETECTED Final   Candida parapsilosis NOT DETECTED NOT DETECTED Final   Candida tropicalis NOT DETECTED NOT DETECTED Final   Cryptococcus neoformans/gattii NOT DETECTED NOT DETECTED Final    Comment: Performed at [facility], [place] [address], [place], [place]  [address]  Resp Panel by RT-PCR (Flu A&B, Covid) Nasopharyngeal Swab     Status: None   Collection Time: 11/21/20  7:30 PM   Specimen: Nasopharyngeal Swab; Nasopharyngeal(NP) swabs in vial transport medium  Result Value Ref Range Status   SARS Coronavirus 2 by RT PCR NEGATIVE NEGATIVE Final    Comment: (NOTE) SARS-CoV-2 target nucleic acids are NOT DETECTED.  The SARS-CoV-2 RNA is generally detectable in upper respiratory specimens during the acute phase of infection. The lowest concentration of SARS-CoV-2 viral copies this assay can detect is 138 copies/mL. A negative result does not preclude SARS-Cov-2 infection and should not be used as the sole basis for treatment or other patient management decisions. A negative result may occur with  improper specimen collection/handling, submission of specimen other than nasopharyngeal swab, presence of viral mutation(s) within the areas targeted by this assay, and inadequate number of viral copies(<138 copies/mL). A negative result must be combined with clinical observations, patient history, and epidemiological information. The expected result is Negative.  Fact Sheet for Patients:  EntrepreneurPulse.com.au  Fact Sheet for Healthcare Providers:  IncredibleEmployment.be  This test is no t yet approved or cleared by the [place] FDA and  has been authorized for detection and/or diagnosis of SARS-CoV-2 by FDA under an Emergency Use  Authorization (EUA). This EUA will remain  in effect (meaning this test can be used) for the duration of the COVID-19 declaration under Section 564(b)(1) of the Act, 21 U.S.C.section 360bbb-3(b)(1), unless the authorization is terminated  or revoked sooner.       Influenza A by PCR NEGATIVE NEGATIVE Final   Influenza B by PCR NEGATIVE NEGATIVE Final    Comment: (NOTE) The Xpert Xpress SARS-CoV-2/FLU/RSV plus assay is intended as an aid in the diagnosis of influenza from Nasopharyngeal swab specimens and should not be used as a sole basis for treatment. Nasal washings and aspirates are unacceptable for Xpert Xpress SARS-CoV-2/FLU/RSV testing.  Fact Sheet for Patients: EntrepreneurPulse.com.au  Fact Sheet for Healthcare Providers: IncredibleEmployment.be  This test is not yet approved or cleared by the [place] FDA and has been authorized for detection and/or diagnosis of SARS-CoV-2 by FDA under an Emergency Use Authorization (EUA). This EUA will remain in effect (meaning this test can be used) for the duration of the COVID-19 declaration under Section 564(b)(1) of the Act, 21 U.S.C. section 360bbb-3(b)(1), unless the authorization is terminated or revoked.  Performed at [facility], [place] [address] [place], [place] [address]   Blood Culture (routine x 2)     Status: None   Collection Time: 11/21/20  7:30 PM   Specimen: BLOOD LEFT WRIST  Result Value Ref Range Status   Specimen Description BLOOD LEFT WRIST  Final   Special Requests   Final    BOTTLES DRAWN AEROBIC AND ANAEROBIC Blood Culture results may not be optimal due to an inadequate volume of blood received in culture bottles   Culture   Final    NO GROWTH 5 DAYS Performed at [facility], [place] [address] [place], [place] [address]    Report Status 11/26/2020 FINAL  Final  Urine Culture     Status: None   Collection Time: 11/22/20  7:42 AM   Specimen: In/Out Cath  Urine  Result Value Ref Range Status   Specimen Description IN/OUT CATH URINE  Final   Special Requests NONE  Final   Culture   Final    NO GROWTH Performed at [facility], [address] [place], [place] [address]    Report Status 11/23/2020 FINAL  Final  SARS CORONAVIRUS 2 (TAT 6-24 HRS) Nasopharyngeal Nasopharyngeal Swab     Status: None   Collection Time: 11/27/20  3:00 PM   Specimen: Nasopharyngeal Swab  Result Value Ref Range Status   SARS Coronavirus 2 NEGATIVE NEGATIVE Final    Comment: (NOTE) SARS-CoV-2 target nucleic acids are NOT DETECTED.  The SARS-CoV-2 RNA is generally detectable in upper and lower respiratory specimens during the acute phase of infection. Negative results do not preclude SARS-CoV-2 infection, do not rule out co-infections with other pathogens, and should not be used as the sole basis for treatment or other patient management decisions. Negative results must be combined with clinical observations, patient history, and epidemiological information. The expected result is Negative.  Fact Sheet for Patients: SugarRoll.be  Fact Sheet for Healthcare Providers: https://www.woods-mathews.com/  This test is not yet approved or cleared by the [place] FDA and  has been authorized for detection and/or diagnosis of SARS-CoV-2 by FDA under an Emergency Use Authorization (EUA). This EUA will remain  in effect (meaning this test can be used) for the duration of the COVID-19 declaration under Se ction 564(b)(1) of the Act, 21 U.S.C. section 360bbb-3(b)(1), unless the authorization is terminated or revoked sooner.  Performed at [facility], [place] [address], [place], [place] [address]           Radiology Studies: No results found.      Scheduled Meds:  allopurinol  100 mg Oral Daily   enoxaparin (LOVENOX) injection  30 mg Subcutaneous QHS   famotidine  20 mg Oral Daily   ferrous  sulfate  325 mg Oral Daily   fluticasone furoate-vilanterol  1 puff Inhalation Daily   melatonin  3 mg Oral QHS   montelukast  10 mg Oral QHS   sertraline  50 mg Oral Daily   Continuous Infusions:    Time spent 35 minutes with more than 50% on [place], MD [facility] 11/29/2020, 8:29 AM

## 2020-11-29 NOTE — TOC Progression Note (Signed)
Transition of Care [facility]) - Progression Note    Patient Details  Name: [name] MRN: [id number] Date of Birth: 06/23/1927  Transition of Care [facility]) CM/SW [place], [place] Phone Number: 11/29/2020, 10:46 AM  Clinical Narrative:     CSW spoke with Claiborne Billings from [place].  [facility] has not received insurance auth for pt.  It will probably be on Monday.  TOC will continue to assist with disposition planning.  Expected Discharge Plan: [place] Barriers to Discharge: Continued Medical Work up  Expected Discharge Plan and Services Expected Discharge Plan: [place]   Discharge Planning Services: CM Consult Post Acute Care Choice: [place] arrangements for the past 2 months: Single Family Home                 DME Arranged: N/A         HH Arranged: PT, RN [place] Agency: [place] Date [place]: 11/24/20 Time HH Agency Contacted: 39 Representative spoke with at [place]: [place],   Social Determinants of Health ([place]) Interventions    Readmission Risk Interventions No flowsheet data found.

## 2020-11-30 DIAGNOSIS — J45909 Unspecified asthma, uncomplicated: Secondary | ICD-10-CM | POA: Diagnosis not present

## 2020-11-30 DIAGNOSIS — J690 Pneumonitis due to inhalation of food and vomit: Secondary | ICD-10-CM | POA: Diagnosis not present

## 2020-11-30 DIAGNOSIS — I5032 Chronic diastolic (congestive) heart failure: Secondary | ICD-10-CM | POA: Diagnosis not present

## 2020-11-30 DIAGNOSIS — G9341 Metabolic encephalopathy: Secondary | ICD-10-CM | POA: Diagnosis not present

## 2020-11-30 LAB — CREATININE, SERUM
Creatinine, Ser: 1.27 mg/dL — ABNORMAL HIGH (ref 0.44–1.00)
GFR, Estimated: 40 mL/min — ABNORMAL LOW (ref >60)

## 2020-11-30 NOTE — TOC Progression Note (Signed)
Transition of Care [facility]) - Progression Note    Patient Details  Name: [name] MRN: [id number] Date of Birth: 01-Dec-1927  Transition of Care [facility]) CM/SW [place], [place] Phone Number: 11/30/2020, 5:52 PM  Clinical Narrative:     Insurance authorization pending. CSW will follow up with [facility] in the morning on insurance authorization determination. CSW will continue to follow and assist with patients dc planning needs.  Expected Discharge Plan: [place] Barriers to Discharge: Continued Medical Work up  Expected Discharge Plan and Services Expected Discharge Plan: [place]   Discharge Planning Services: CM Consult Post Acute Care Choice: [place] arrangements for the past 2 months: Single Family Home                 DME Arranged: N/A         HH Arranged: PT, RN [place] Agency: [place] Date [place]: 11/24/20 Time HH Agency Contacted: 34 Representative spoke with at [place]: [place],   Social Determinants of Health ([place]) Interventions    Readmission Risk Interventions No flowsheet data found.

## 2020-11-30 NOTE — Progress Notes (Signed)
PROGRESS NOTE    [name]  VPX:[id number]  DOB: [birth year]  PCP: [name], MD Admit date:11/21/2020 Chief compliant: Fever, cough, AMS Hospital course: 87 y.o. female with medical history significant for chronic diastolic CHF (EF 46-27%), paroxysmal atrial fibrillation not on anticoagulation due to low A. fib burden, sinus node dysfunction s/p PPM, CKD stage IIIa, hypertension, asthma who presented to the ED for evaluation of confusion and fever/cough.  On presentation, she was febrile, tachypneic.  COVID and influenza tests were negative.  Chest x-ray showed possible right upper lobe pneumonia.  CT head was negative for acute intracranial abnormality.  She was started on IV fluids and antibiotics.  Patient has subsequently completed antibiotic treatment.  PT recommended SNF placement.  She is currently medically stable for discharge to SNF. Insurance authorization pending would likely be on Monday per social work  Subjective: Patient sitting comfortably in chair.  Denies any new complaints.   Objective: Vitals:   11/30/20 0500 11/30/20 0505 11/30/20 0823 11/30/20 0830  BP: (!) 122/50 (!) 122/50 (!) 129/57   Pulse: 60 60 61   Resp: 18 18 18    Temp: 97.6 F (36.4 C) 97.6 F (36.4 C) 97.8 F (36.6 C)   TempSrc: Oral     SpO2: 94% 94% 94% 95%  Weight:        Intake/Output Summary (Last 24 hours) at 11/30/2020 1045 Last data filed at 11/30/2020 0938 Gross per 24 hour  Intake 480 ml  Output --  Net 480 ml   Filed Weights   11/24/20 0356 11/25/20 0416 11/26/20 0343  Weight: 77.6 kg 75.7 kg 75.1 kg    Physical Examination: General: Moderately built, no acute distress noted Head ENT: Atraumatic normocephalic, PERRLA, neck supple Heart: S1-S2 heard, regular rate and rhythm, no murmurs.  No leg edema noted Lungs: Equal air entry bilaterally, no rhonchi or rales on exam, no accessory muscle use Abdomen: Bowel sounds heard, soft, nontender, nondistended. No  organomegaly.  No CVA tenderness Extremities: Right arm in cast/splint.  No pedal edema.  No cyanosis or clubbing. Neurological: Awake alert oriented x2, no focal weakness or numbness, strength and sensations to crude touch intact Skin: No wounds or rashes.    Data Reviewed: I have personally reviewed following labs and imaging studies  CBC: Recent Labs  Lab 11/24/20 0226 11/27/20 0136 11/28/20 0145 11/29/20 0111  WBC 5.9 6.6 6.7 5.1  NEUTROABS  --  4.5 4.3 3.0  HGB 8.9* 9.3* 9.1* 9.2*  HCT 27.2* 28.9* 28.3* 29.0*  MCV 101.1* 101.0* 100.7* 102.5*  PLT 128* 192 210 035   Basic Metabolic Panel: Recent Labs  Lab 11/25/20 0146 11/26/20 0214 11/27/20 0136 11/28/20 0145 11/29/20 0111 11/30/20 0108  NA 134* 129* 136 135 135  --   K 3.5 3.5 3.5 3.7 3.9  --   CL 101 97* 101 103 104  --   CO2 23 24 23 24 24   --   GLUCOSE 119* 120* 115* 124* 103*  --   BUN 21 29* 41* 45* 40*  --   CREATININE 1.14* 1.18* 1.35* 1.46* 1.26* 1.27*  CALCIUM 8.5* 8.0* 8.2* 8.4* 8.7*  --   MG 1.5* 2.4 2.0 2.0 2.0  --    GFR: CrCl cannot be calculated (Unknown ideal weight.). Liver Function Tests: No results for input(s): AST, ALT, ALKPHOS, BILITOT, PROT, ALBUMIN in the last 168 hours. No results for input(s): LIPASE, AMYLASE in the last 168 hours. No results for input(s): AMMONIA in the last 168  hours. Coagulation Profile: No results for input(s): INR, PROTIME in the last 168 hours. Cardiac Enzymes: No results for input(s): CKTOTAL, CKMB, CKMBINDEX, TROPONINI in the last 168 hours. BNP (last 3 results) No results for input(s): PROBNP in the last 8760 hours. HbA1C: No results for input(s): HGBA1C in the last 72 hours. CBG: No results for input(s): GLUCAP in the last 168 hours. Lipid Profile: No results for input(s): CHOL, HDL, LDLCALC, TRIG, CHOLHDL, LDLDIRECT in the last 72 hours. Thyroid Function Tests: No results for input(s): TSH, T4TOTAL, FREET4, T3FREE, THYROIDAB in the last 72  hours. Anemia Panel: No results for input(s): VITAMINB12, FOLATE, FERRITIN, TIBC, IRON, RETICCTPCT in the last 72 hours. Sepsis Labs: Recent Labs  Lab 11/24/20 0226  PROCALCITON 2.73    Recent Results (from the past 240 hour(s))  Blood Culture (routine x 2)     Status: Abnormal   Collection Time: 11/21/20  7:21 PM   Specimen: BLOOD RIGHT ARM  Result Value Ref Range Status   Specimen Description BLOOD RIGHT ARM  Final   Special Requests   Final    BOTTLES DRAWN AEROBIC AND ANAEROBIC Blood Culture adequate volume   Culture  Setup Time   Final    GRAM POSITIVE COCCI IN CLUSTERS AEROBIC BOTTLE ONLY CRITICAL RESULT CALLED TO, READ BACK BY AND VERIFIED WITH: PHARMD C AMEND AT 8119 11/23/2020 BY L BENFIELD    Culture (A)  Final    STAPHYLOCOCCUS HOMINIS THE SIGNIFICANCE OF ISOLATING THIS ORGANISM FROM A SINGLE SET OF BLOOD CULTURES WHEN MULTIPLE SETS ARE DRAWN IS UNCERTAIN. PLEASE NOTIFY THE MICROBIOLOGY DEPARTMENT WITHIN ONE WEEK IF SPECIATION AND SENSITIVITIES ARE REQUIRED. Performed at [facility], [place] [address], [place], [place] [address]    Report Status 11/25/2020 FINAL  Final  Blood Culture ID Panel (Reflexed)     Status: Abnormal   Collection Time: 11/21/20  7:21 PM  Result Value Ref Range Status   Enterococcus faecalis NOT DETECTED NOT DETECTED Final   Enterococcus Faecium NOT DETECTED NOT DETECTED Final   Listeria monocytogenes NOT DETECTED NOT DETECTED Final   Staphylococcus species DETECTED (A) NOT DETECTED Final    Comment: PHARMD C AMEND AT 9562 11/23/2020 BY L BENFIELD   Staphylococcus aureus (BCID) NOT DETECTED NOT DETECTED Final   Staphylococcus epidermidis NOT DETECTED NOT DETECTED Final   Staphylococcus lugdunensis NOT DETECTED NOT DETECTED Final   Streptococcus species NOT DETECTED NOT DETECTED Final   Streptococcus agalactiae NOT DETECTED NOT DETECTED Final   Streptococcus pneumoniae NOT DETECTED NOT DETECTED Final   Streptococcus pyogenes NOT DETECTED  NOT DETECTED Final   A.calcoaceticus-baumannii NOT DETECTED NOT DETECTED Final   Bacteroides fragilis NOT DETECTED NOT DETECTED Final   Enterobacterales NOT DETECTED NOT DETECTED Final   Enterobacter cloacae complex NOT DETECTED NOT DETECTED Final   Escherichia coli NOT DETECTED NOT DETECTED Final   Klebsiella aerogenes NOT DETECTED NOT DETECTED Final   Klebsiella oxytoca NOT DETECTED NOT DETECTED Final   Klebsiella pneumoniae NOT DETECTED NOT DETECTED Final   Proteus species NOT DETECTED NOT DETECTED Final   Salmonella species NOT DETECTED NOT DETECTED Final   Serratia marcescens NOT DETECTED NOT DETECTED Final   Haemophilus influenzae NOT DETECTED NOT DETECTED Final   Neisseria meningitidis NOT DETECTED NOT DETECTED Final   Pseudomonas aeruginosa NOT DETECTED NOT DETECTED Final   Stenotrophomonas maltophilia NOT DETECTED NOT DETECTED Final   Candida albicans NOT DETECTED NOT DETECTED Final   Candida auris NOT DETECTED NOT DETECTED Final   Candida  glabrata NOT DETECTED NOT DETECTED Final   Candida krusei NOT DETECTED NOT DETECTED Final   Candida parapsilosis NOT DETECTED NOT DETECTED Final   Candida tropicalis NOT DETECTED NOT DETECTED Final   Cryptococcus neoformans/gattii NOT DETECTED NOT DETECTED Final    Comment: Performed at [facility], [place] [address], [place], [place] [address]  Resp Panel by RT-PCR (Flu A&B, Covid) Nasopharyngeal Swab     Status: None   Collection Time: 11/21/20  7:30 PM   Specimen: Nasopharyngeal Swab; Nasopharyngeal(NP) swabs in vial transport medium  Result Value Ref Range Status   SARS Coronavirus 2 by RT PCR NEGATIVE NEGATIVE Final    Comment: (NOTE) SARS-CoV-2 target nucleic acids are NOT DETECTED.  The SARS-CoV-2 RNA is generally detectable in upper respiratory specimens during the acute phase of infection. The lowest concentration of SARS-CoV-2 viral copies this assay can detect is 138 copies/mL. A negative result does not preclude  SARS-Cov-2 infection and should not be used as the sole basis for treatment or other patient management decisions. A negative result may occur with  improper specimen collection/handling, submission of specimen other than nasopharyngeal swab, presence of viral mutation(s) within the areas targeted by this assay, and inadequate number of viral copies(<138 copies/mL). A negative result must be combined with clinical observations, patient history, and epidemiological information. The expected result is Negative.  Fact Sheet for Patients:  EntrepreneurPulse.com.au  Fact Sheet for Healthcare Providers:  IncredibleEmployment.be  This test is no t yet approved or cleared by the [place] FDA and  has been authorized for detection and/or diagnosis of SARS-CoV-2 by FDA under an Emergency Use Authorization (EUA). This EUA will remain  in effect (meaning this test can be used) for the duration of the COVID-19 declaration under Section 564(b)(1) of the Act, 21 U.S.C.section 360bbb-3(b)(1), unless the authorization is terminated  or revoked sooner.       Influenza A by PCR NEGATIVE NEGATIVE Final   Influenza B by PCR NEGATIVE NEGATIVE Final    Comment: (NOTE) The Xpert Xpress SARS-CoV-2/FLU/RSV plus assay is intended as an aid in the diagnosis of influenza from Nasopharyngeal swab specimens and should not be used as a sole basis for treatment. Nasal washings and aspirates are unacceptable for Xpert Xpress SARS-CoV-2/FLU/RSV testing.  Fact Sheet for Patients: EntrepreneurPulse.com.au  Fact Sheet for Healthcare Providers: IncredibleEmployment.be  This test is not yet approved or cleared by the [place] FDA and has been authorized for detection and/or diagnosis of SARS-CoV-2 by FDA under an Emergency Use Authorization (EUA). This EUA will remain in effect (meaning this test can be used) for the duration of  the COVID-19 declaration under Section 564(b)(1) of the Act, 21 U.S.C. section 360bbb-3(b)(1), unless the authorization is terminated or revoked.  Performed at [facility], [place] [address], [place], [place] [address]   Blood Culture (routine x 2)     Status: None   Collection Time: 11/21/20  7:30 PM   Specimen: BLOOD LEFT WRIST  Result Value Ref Range Status   Specimen Description BLOOD LEFT WRIST  Final   Special Requests   Final    BOTTLES DRAWN AEROBIC AND ANAEROBIC Blood Culture results may not be optimal due to an inadequate volume of blood received in culture bottles   Culture   Final    NO GROWTH 5 DAYS Performed at [facility], [place] [address], [place], [place] [address]    Report Status 11/26/2020 FINAL  Final  Urine Culture  Status: None   Collection Time: 11/22/20  7:42 AM   Specimen: In/Out Cath Urine  Result Value Ref Range Status   Specimen Description IN/OUT CATH URINE  Final   Special Requests NONE  Final   Culture   Final    NO GROWTH Performed at [facility], [place] [address] [place], [place] [address]    Report Status 11/23/2020 FINAL  Final  SARS CORONAVIRUS 2 (TAT 6-24 HRS) Nasopharyngeal Nasopharyngeal Swab     Status: None   Collection Time: 11/27/20  3:00 PM   Specimen: Nasopharyngeal Swab  Result Value Ref Range Status   SARS Coronavirus 2 NEGATIVE NEGATIVE Final    Comment: (NOTE) SARS-CoV-2 target nucleic acids are NOT DETECTED.  The SARS-CoV-2 RNA is generally detectable in upper and lower respiratory specimens during the acute phase of infection. Negative results do not preclude SARS-CoV-2 infection, do not rule out co-infections with other pathogens, and should not be used as the sole basis for treatment or other patient management decisions. Negative results must be combined with clinical observations, patient history, and epidemiological information. The expected result is Negative.  Fact Sheet for  Patients: SugarRoll.be  Fact Sheet for Healthcare Providers: https://www.woods-mathews.com/  This test is not yet approved or cleared by the [place] FDA and  has been authorized for detection and/or diagnosis of SARS-CoV-2 by FDA under an Emergency Use Authorization (EUA). This EUA will remain  in effect (meaning this test can be used) for the duration of the COVID-19 declaration under Se ction 564(b)(1) of the Act, 21 U.S.C. section 360bbb-3(b)(1), unless the authorization is terminated or revoked sooner.  Performed at [facility], [place] [address] [place], [place] [address]       Radiology Studies: No results found.    Scheduled Meds:  allopurinol  100 mg Oral Daily   enoxaparin (LOVENOX) injection  30 mg Subcutaneous QHS   famotidine  20 mg Oral Daily   ferrous sulfate  325 mg Oral Daily   fluticasone furoate-vilanterol  1 puff Inhalation Daily   melatonin  3 mg Oral QHS   montelukast  10 mg Oral QHS   sertraline  50 mg Oral Daily   Continuous Infusions:    Assessment/Plan:  1.RUL Pneumonia with acute hypoxic respiratory failure POA: Community acquired vs aspiration related. Hypoxia resolved, treated with Rocephin and Zithromax for 5 days  2.Acute metabolic encephalopathy: POA sec to above and possibly underlying dementia.CT head -ve.  -Still confused to time.   Fall precautions.  Delirium precautions.Might benefit from outpatient neuro-psych evaluation.  3. Frequent falls, recent distal right radius fracture with cast in place:PT OT recommend SNF placement. Insurance Auth pending likely Monday  4.AKI on CKD stage IIIa-Creatinine 1.1 on admission , worsened to 1.4 and now gradually trending down.  Continue to hold Lasix.  5. Paroxysmal A. Fib,Sinus node dysfunction status post PPM-Not on anticoagulation due to low A. fib burden per cardiology notes.  Currently rate controlled.  Not on any rate or rhythm controlling  medications  6. Chronic diastolic CHF: Lasix held 31/54 because of hyponatremia and AKI.Euvolemic off diuretics. Resume other meds  7.Asthma-Continue Singulair, Breo Ellipta and albuterol as needed.   8. Essential hypertension-Blood pressure stable.  Amlodipine on hold  9. Iron deficiency anemia-Continue oral ferrous sulfate.  Hemoglobin stable   10. GERD-Continue famotidine   11. Anxiety/depression-continue sertraline  DVT prophylaxis: Lovenox Code Status: DNR Family Communication: None at bedside  disposition Plan: SNF Status is: Inpatient  Remains inpatient appropriate because: Will need SNF placement once bed is available.  Insurance Auth pending Currently medically stable for discharge to SNF    Time spent: 25 mins     >50% time spent in discussions with care team and coordination of care.    Guilford Shi, MD [facility] Pager in [place]  If 7PM-7AM, please contact night-coverage [email or web address] 11/30/2020, 10:45 AM

## 2020-11-30 NOTE — Plan of Care (Signed)

## 2020-12-01 DIAGNOSIS — G9341 Metabolic encephalopathy: Secondary | ICD-10-CM | POA: Diagnosis not present

## 2020-12-01 DIAGNOSIS — Z743 Need for continuous supervision: Secondary | ICD-10-CM | POA: Diagnosis not present

## 2020-12-01 DIAGNOSIS — F29 Unspecified psychosis not due to a substance or known physiological condition: Secondary | ICD-10-CM | POA: Diagnosis not present

## 2020-12-01 DIAGNOSIS — I129 Hypertensive chronic kidney disease with stage 1 through stage 4 chronic kidney disease, or unspecified chronic kidney disease: Secondary | ICD-10-CM | POA: Diagnosis not present

## 2020-12-01 DIAGNOSIS — D649 Anemia, unspecified: Secondary | ICD-10-CM | POA: Diagnosis not present

## 2020-12-01 DIAGNOSIS — R69 Illness, unspecified: Secondary | ICD-10-CM | POA: Diagnosis not present

## 2020-12-01 DIAGNOSIS — M25531 Pain in right wrist: Secondary | ICD-10-CM | POA: Diagnosis not present

## 2020-12-01 DIAGNOSIS — K5900 Constipation, unspecified: Secondary | ICD-10-CM | POA: Diagnosis not present

## 2020-12-01 DIAGNOSIS — S72009A Fracture of unspecified part of neck of unspecified femur, initial encounter for closed fracture: Secondary | ICD-10-CM | POA: Diagnosis not present

## 2020-12-01 DIAGNOSIS — M810 Age-related osteoporosis without current pathological fracture: Secondary | ICD-10-CM | POA: Diagnosis not present

## 2020-12-01 DIAGNOSIS — E782 Mixed hyperlipidemia: Secondary | ICD-10-CM | POA: Diagnosis not present

## 2020-12-01 DIAGNOSIS — M6530 Trigger finger, unspecified finger: Secondary | ICD-10-CM | POA: Diagnosis not present

## 2020-12-01 DIAGNOSIS — J411 Mucopurulent chronic bronchitis: Secondary | ICD-10-CM | POA: Diagnosis not present

## 2020-12-01 DIAGNOSIS — K219 Gastro-esophageal reflux disease without esophagitis: Secondary | ICD-10-CM | POA: Diagnosis not present

## 2020-12-01 DIAGNOSIS — E875 Hyperkalemia: Secondary | ICD-10-CM | POA: Diagnosis not present

## 2020-12-01 DIAGNOSIS — J189 Pneumonia, unspecified organism: Secondary | ICD-10-CM | POA: Diagnosis not present

## 2020-12-01 DIAGNOSIS — I5032 Chronic diastolic (congestive) heart failure: Secondary | ICD-10-CM | POA: Diagnosis not present

## 2020-12-01 DIAGNOSIS — I959 Hypotension, unspecified: Secondary | ICD-10-CM | POA: Diagnosis not present

## 2020-12-01 DIAGNOSIS — M1990 Unspecified osteoarthritis, unspecified site: Secondary | ICD-10-CM | POA: Diagnosis not present

## 2020-12-01 DIAGNOSIS — I480 Paroxysmal atrial fibrillation: Secondary | ICD-10-CM | POA: Diagnosis not present

## 2020-12-01 DIAGNOSIS — R102 Pelvic and perineal pain: Secondary | ICD-10-CM | POA: Diagnosis not present

## 2020-12-01 DIAGNOSIS — I10 Essential (primary) hypertension: Secondary | ICD-10-CM | POA: Diagnosis not present

## 2020-12-01 DIAGNOSIS — J441 Chronic obstructive pulmonary disease with (acute) exacerbation: Secondary | ICD-10-CM | POA: Diagnosis not present

## 2020-12-01 DIAGNOSIS — J690 Pneumonitis due to inhalation of food and vomit: Secondary | ICD-10-CM | POA: Diagnosis not present

## 2020-12-01 DIAGNOSIS — M109 Gout, unspecified: Secondary | ICD-10-CM | POA: Diagnosis not present

## 2020-12-01 DIAGNOSIS — S52501A Unspecified fracture of the lower end of right radius, initial encounter for closed fracture: Secondary | ICD-10-CM | POA: Diagnosis not present

## 2020-12-01 DIAGNOSIS — J80 Acute respiratory distress syndrome: Secondary | ICD-10-CM | POA: Diagnosis not present

## 2020-12-01 DIAGNOSIS — I4891 Unspecified atrial fibrillation: Secondary | ICD-10-CM | POA: Diagnosis not present

## 2020-12-01 LAB — RESP PANEL BY RT-PCR (FLU A&B, COVID) ARPGX2
Influenza A by PCR: NEGATIVE
Influenza B by PCR: NEGATIVE
SARS Coronavirus 2 by RT PCR: NEGATIVE

## 2020-12-01 NOTE — TOC Transition Note (Signed)
Transition of Care [facility]) - CM/SW Discharge Note   Patient Details  Name: [name] MRN: [id number] Date of Birth: [birth year]  [facility]) CM/SW Contact:  Emeterio Reeve, LCSW Phone Number: 12/01/2020, 11:10 AM   Clinical Narrative:     Patient will DC to: [facility] Anticipated DC date: 12/01/20 Family notified: daughters Transport by: Corey Harold     Per MD patient ready for DC to [facility]. RN, patient, patient's family, and facility notified of DC. Discharge Summary and FL2 sent to facility. DC packet on chart. Insurance Josem Kaufmann has been received and pt is covid negative. Ambulance transport requested for patient.    RN to call report to [phone]. Pt will go to room 603B.  CSW will sign off for now as social work intervention is no longer needed. Please consult [place] again if new needs arise.   Final next level of care: Skilled Nursing Facility Barriers to Discharge: Barriers Resolved   Patient Goals and CMS Choice Patient states their goals for this hospitalization and ongoing recovery are:: to go home CMS Medicare.gov Compare Post Acute Care list provided to:: Patient Choice offered to / list presented to : Patient (grand daughter)  Discharge Placement              Patient chooses bed at: [facility] Patient to be transferred to facility by: [facility] Name of family member notified: Daughter Patient and family notified of of transfer: 12/01/20  Discharge Plan and Services   Discharge Planning Services: CM Consult Post Acute Care Choice: Home Health          DME Arranged: N/A         HH Arranged: PT, RN [place] Agency: [place] Date [place]: 11/24/20 Time HH Agency Contacted: 52 Representative spoke with at [place]: [place],  Social Determinants of Health ([place]) Interventions     Readmission Risk Interventions No flowsheet data found.   Emeterio Reeve, LCSW Clinical Social Worker

## 2020-12-01 NOTE — Progress Notes (Signed)
Physical Therapy Treatment Patient Details Name: [name] MRN: [id number] DOB: [birth year] Today's Date: 12/01/2020   History of Present Illness 87 yo admitted 10/14 with confusion and fever with PNA. pt with Right wrist cast.  PMHx: CHF, PAF, PPM, CKD, HTN, asthma, gout, L TKA, Left hip fx    PT Comments    Pt admitted with above diagnosis. Pt was able to ambulate with RW with assist and cues for safety at times. Pt progressing but continues to need assist with ADLs to clean self after using bathroom. Cues for safety with ambulation as well. Pt progressing slowly.   Pt currently with functional limitations due to balance and endurance deficits. Pt will benefit from skilled PT to increase their independence and safety with mobility to allow discharge to the venue listed below.      Recommendations for follow up therapy are one component of a multi-disciplinary discharge planning process, led by the attending physician.  Recommendations may be updated based on patient status, additional functional criteria and insurance authorization.  Follow Up Recommendations  Skilled nursing-short term rehab (<3 hours/day)     Assistance Recommended at Discharge Frequent or constant Supervision/Assistance  Equipment Recommendations  Rolling walker (2 wheels);Hospital bed (RW with right platform attachment)    Recommendations for Other Services       Precautions / Restrictions Precautions Precautions: Fall Precaution Comments: Watch SpO2; HOH Required Braces or Orthoses: Splint/Cast Splint/Cast: right wrist cast - granddaughter reports that pt has had this for ~6 weeks now. Restrictions Other Position/Activity Restrictions: Assumed NWB for R wrist     Mobility  Bed Mobility               General bed mobility comments: Pt on toilet on arrival.  Assist to standing with min guard assist to stand from toilet and had to assist pt with cleanign after urinating and having BM.     Transfers Overall transfer level: Needs assistance Equipment used: Right platform walker Transfers: Sit to/from Stand Sit to Stand: Min guard;Min assist           General transfer comment: Pt was able to stand from toilet with cues only and no assist.  A little assist to steady when cleaning pt.  Needs mod cues at times for safety as she has poor safety awareness.    Ambulation/Gait Ambulation/Gait assistance: Min assist;Min guard Gait Distance (Feet): 110 Feet Assistive device: Right platform walker Gait Pattern/deviations: Step-through pattern;Decreased stride length;Trunk flexed;Leaning posteriorly;Staggering right;Staggering left   Gait velocity interpretation: <1.8 ft/sec, indicate of risk for recurrent falls General Gait Details: Pt generally more steady today. She was able to steer RW a little better only needing assist to direct RW at times.  Pt still  needs right platform for RW for weight bearing and incr stabiltiy.  Pt limited by fatigue.   Stairs             Wheelchair Mobility    Modified Rankin (Stroke Patients Only)       Balance Overall balance assessment: Needs assistance;History of Falls Sitting-balance support: No upper extremity supported;Feet supported Sitting balance-Leahy Scale: Good     Standing balance support: Single extremity supported;No upper extremity supported;During functional activity Standing balance-Leahy Scale: Poor Standing balance comment: needs external support as well as UE support on right PFRW                            Cognition Arousal/Alertness: Awake/alert Behavior During  Therapy: WFL for tasks assessed/performed Overall Cognitive Status: Impaired/Different from baseline Area of Impairment: Following commands;Safety/judgement;Problem solving                 Orientation Level: Time     Following Commands: Follows one step commands with increased time Safety/Judgement: Decreased awareness of  deficits   Problem Solving: Slow processing;Requires verbal cues General Comments: Cues for attention to task. Patient very [place].        Exercises General Exercises - Lower Extremity Long Arc Quad: AROM;Both;10 reps;Seated    General Comments General comments (skin integrity, edema, etc.): VSS on RA      Pertinent Vitals/Pain Pain Assessment: No/denies pain    Home Living                          Prior Function            PT Goals (current goals can now be found in the care plan section) Progress towards PT goals: Progressing toward goals    Frequency    Min 2X/week      PT Plan Current plan remains appropriate    Co-evaluation              AM-PAC PT "6 Clicks" Mobility   Outcome Measure  Help needed turning from your back to your side while in a flat bed without using bedrails?: A Little Help needed moving from lying on your back to sitting on the side of a flat bed without using bedrails?: A Little Help needed moving to and from a bed to a chair (including a wheelchair)?: A Lot Help needed standing up from a chair using your arms (e.g., wheelchair or bedside chair)?: A Lot Help needed to walk in hospital room?: A Little Help needed climbing 3-5 steps with a railing? : A Lot 6 Click Score: 15    End of Session Equipment Utilized During Treatment: Gait belt;Oxygen Activity Tolerance: Patient tolerated treatment well Patient left: in chair;with call bell/phone within reach;with chair alarm set Nurse Communication: Mobility status PT Visit Diagnosis: Other abnormalities of gait and mobility (R26.89);History of falling (Z91.81);Muscle weakness (generalized) (M62.81)     Time: 0932-6712 PT Time Calculation (min) (ACUTE ONLY): 13 min  Charges:  $Gait Training: 8-22 mins                     Perrin Gens M,PT Acute [place] [phone] (pager)    Alvira Philips 12/01/2020, 9:54 AM

## 2020-12-01 NOTE — Discharge Summary (Signed)
Physician Discharge Summary  [name] GMW:[id number] DOB: May 06, 1927 DOA: 11/21/2020  PCP: [name], MD  Admit date: 11/21/2020 Discharge date: 12/01/2020  Admitted From: Home Disposition: Skilled nursing facility  Recommendations for Outpatient Follow-up:  Follow up with PCP in 1-2 weeks Please obtain BMP/CBC in one week   Home Health: N/A Equipment/Devices: N/A  Discharge Condition: Stable CODE STATUS: DNR Diet recommendation: Low-salt diet  Discharge summary:  87 year old female with history of chronic diastolic congestive heart failure, paroxysmal A. fib not on anticoagulation due to low A. fib burden, sinus node dysfunction status post pacemaker, stage IIIa chronic kidney disease, hypertension and asthma presented to the emergency room for evaluation of confusion, fever and cough.  She was febrile on presentation.  COVID-19 and influenza test were negative.  Chest x-ray showed right upper lobe pneumonia.  CT head was normal.  Treated with IV fluids and IV antibiotics and improved.  Needing skilled nursing rehab for inpatient physical therapies.  Right upper lobe pneumonia, come to acquired pneumonia with hypoxia and sepsis present on admission.  Acute metabolic encephalopathy due to infection.  Improved. Clinically improved.  Sepsis resolved.  Completed antibiotic therapy.  Currently on room air.  Recent right distal radius fracture and frequent falls with deconditioning: Continue to work with PT OT and attempt therapies.  Fall precautions.  Delirium precautions.  Acute kidney injury on chronic kidney disease stage IIIa: Improved and at about her baseline.  Creatinine 1.2.  Paroxysmal A. fib and sinus node dysfunction and status post permanent pacemaker /chronic diastolic heart failure.  Essential hypertension Well compensated.  She is on as needed Lasix.  Continue. She is in sinus rhythm.  Has a pacemaker.  She is not on anticoagulation secondary to low  A. fib burden. Resume low-dose amlodipine for blood pressure control.  GERD: On famotidine she will continue.  Anxiety/depression: On sertraline.  Stable for discharge to skilled rehab for inpatient therapies.     Discharge Diagnoses:  Principal Problem:   Right upper lobe pneumonia Active Problems:   Asthma   Chronic diastolic CHF (congestive heart failure), NYHA class 2 (HCC)   CKD (chronic kidney disease), stage III   Essential hypertension   PAF (paroxysmal atrial fibrillation) (HCC)   Acute metabolic encephalopathy    Discharge Instructions  Discharge Instructions     Diet - low sodium heart healthy   Complete by: As directed    Increase activity slowly   Complete by: As directed       Allergies as of 12/01/2020       Reactions   Amoxicillin Other (See Comments)   Very weak Has patient had a PCN reaction causing immediate rash, facial/tongue/throat swelling, SOB or lightheadedness with hypotension: no Has patient had a PCN reaction causing severe rash involving mucus membranes or skin necrosis: no Has patient had a PCN reaction that required hospitalization: no Has patient had a PCN reaction occurring within the last 10 years: no If all of the above answers are "NO", then may proceed with Cephalosporin use.   Diltiazem    Diltiazem Hcl Other (See Comments)   Reaction unknown   Losartan Potassium Other (See Comments)   Potassium level increased drastically   Potassium-containing Compounds Other (See Comments)   Pt is very sensitive to potassium products. Her Potassium rises very quickly and takes a long time to come down.   Sulfonamide Derivatives Other (See Comments)   Reaction unknown   Zetia [ezetimibe] Other (See Comments)   Weakness  Penicillins Rash   DID THE REACTION INVOLVE: Swelling of the face/tongue/throat, SOB, or low BP? Unknown Sudden or severe rash/hives, skin peeling, or the inside of the mouth or nose? Unknown Did it require medical  treatment? Unknown When did it last happen?      unknown If all above answers are "NO", may proceed with cephalosporin use.        Medication List     STOP taking these medications    rosuvastatin 10 MG tablet Commonly known as: CRESTOR   traMADol 50 MG tablet Commonly known as: ULTRAM       TAKE these medications    acetaminophen 500 MG tablet Commonly known as: TYLENOL Take 2 tablets (1,000 mg total) by mouth every 8 (eight) hours as needed for mild pain or headache.   albuterol (2.5 MG/3ML) 0.083% NEBU 3 mL, albuterol (5 MG/ML) 0.5% NEBU 0.5 mL Inhale 5 mg into the lungs every 6 (six) hours as needed (shortness of breath/wheezing).   albuterol 108 (90 Base) MCG/ACT inhaler Commonly known as: VENTOLIN HFA Inhale 1 puff into the lungs every 6 (six) hours as needed for wheezing or shortness of breath.   alendronate 70 MG tablet Commonly known as: FOSAMAX Take 70 mg by mouth every Friday.   allopurinol 100 MG tablet Commonly known as: ZYLOPRIM Take 1 tablet (100 mg total) by mouth daily.   amLODipine 2.5 MG tablet Commonly known as: NORVASC Take 1 tablet (2.5 mg total) by mouth daily. Please make overdue appt with Dr. Meda Coffee before anymore refills. 1st attempt   aspirin EC 81 MG tablet Take 81 mg by mouth daily.   Breo Ellipta 100-25 MCG/ACT Aepb Generic drug: fluticasone furoate-vilanterol Inhale 1 puff into the lungs daily.   famotidine 20 MG tablet Commonly known as: PEPCID Take 20 mg by mouth 2 (two) times daily.   ferrous sulfate 325 (65 FE) MG tablet Take 325 mg by mouth daily.   furosemide 20 MG tablet Commonly known as: LASIX Take 1 tablet (20 mg total) by mouth 2 (two) times daily. What changed:  when to take this reasons to take this   methocarbamol 500 MG tablet Commonly known as: ROBAXIN Take 500 mg by mouth 2 (two) times daily as needed for muscle spasms.   montelukast 10 MG tablet Commonly known as: SINGULAIR TAKE ONE TABLET BY  MOUTH EVERYDAY AT BEDTIME What changed: See the new instructions.   ondansetron 8 MG disintegrating tablet Commonly known as: ZOFRAN-ODT Take 8 mg by mouth every 8 (eight) hours as needed for nausea or vomiting.   sertraline 50 MG tablet Commonly known as: ZOLOFT Take 50 mg by mouth daily.               Durable Medical Equipment  (From admission, onward)           Start     Ordered   11/24/20 1557  For home use only DME 3 n 1  Once        11/24/20 1556   11/24/20 1551  For home use only DME Hospital bed  Once       Question Answer Comment  Length of Need Lifetime   Patient has (list medical condition): Right upper lobe pneumonia   The above medical condition requires: Patient requires the ability to reposition frequently   Head must be elevated greater than: 45 degrees   Bed type Semi-electric   Support Surface: Gel Overlay      11/24/20 1550  Follow-up Information     [facility] [place] Follow up.   Specialty: Home Health Services Contact information: [address] [place] [address] [phone]                Allergies  Allergen Reactions   Amoxicillin Other (See Comments)    Very weak Has patient had a PCN reaction causing immediate rash, facial/tongue/throat swelling, SOB or lightheadedness with hypotension: no Has patient had a PCN reaction causing severe rash involving mucus membranes or skin necrosis: no Has patient had a PCN reaction that required hospitalization: no Has patient had a PCN reaction occurring within the last 10 years: no If all of the above answers are "NO", then may proceed with Cephalosporin use.    Diltiazem    Diltiazem Hcl Other (See Comments)    Reaction unknown   Losartan Potassium Other (See Comments)    Potassium level increased drastically   Potassium-Containing Compounds Other (See Comments)    Pt is very sensitive to potassium products. Her Potassium rises very quickly and takes  a long time to come down.   Sulfonamide Derivatives Other (See Comments)    Reaction unknown   Zetia [Ezetimibe] Other (See Comments)    Weakness   Penicillins Rash    DID THE REACTION INVOLVE: Swelling of the face/tongue/throat, SOB, or low BP? Unknown Sudden or severe rash/hives, skin peeling, or the inside of the mouth or nose? Unknown Did it require medical treatment? Unknown When did it last happen?      unknown If all above answers are "NO", may proceed with cephalosporin use.     Consultations: None   Procedures/Studies: CT HEAD WO CONTRAST (5MM)  Result Date: 11/21/2020 CLINICAL DATA:  Mental status change, unknown cause EXAM: CT HEAD WITHOUT CONTRAST TECHNIQUE: Contiguous axial images were obtained from the base of the skull through the vertex without intravenous contrast. COMPARISON:  CT head 06/20/2010 BRAIN: BRAIN Cerebral ventricle sizes are concordant with the degree of cerebral volume loss. No evidence of large-territorial acute infarction. No parenchymal hemorrhage. No mass lesion. No extra-axial collection. Slightly more conspicuous calcification along the left tentorium noted on prior study. No mass effect or midline shift. No hydrocephalus. Basilar cisterns are patent. Vascular: No hyperdense vessel. Skull: No acute fracture or focal lesion. Sinuses/Orbits: Paranasal sinuses and mastoid air cells are clear. The orbits are unremarkable. Other: None. IMPRESSION: No acute intracranial abnormality. Electronically Signed   By: Iven Finn M.D.   On: 11/21/2020 23:02   DG CHEST PORT 1 VIEW  Result Date: 11/24/2020 CLINICAL DATA:  Shortness of breath, cough, congestion EXAM: PORTABLE CHEST 1 VIEW COMPARISON:  11/21/2020 FINDINGS: Persistent right lung opacities with improvement in aeration. Pulmonary vascular congestion. Cardiomegaly. No significant pleural effusion. Left chest wall dual lead pacemaker. IMPRESSION: Persistent right lung opacities likely reflecting  pneumonia. Some improvement in aeration since the prior study. Cardiomegaly and pulmonary vascular congestion. Electronically Signed   By: Macy Mis M.D.   On: 11/24/2020 08:35   DG Chest Port 1 View  Result Date: 11/21/2020 CLINICAL DATA:  Possible sepsis EXAM: PORTABLE CHEST 1 VIEW COMPARISON:  07/07/2019 FINDINGS: Cardiac shadow is enlarged but stable. Pacing device is again seen and stable. Aortic calcifications are noted. Right upper lobe infiltrate is noted consistent with acute pneumonia. Left lung remains clear. No bony abnormality is noted. IMPRESSION: Right upper lobe pneumonia. Electronically Signed   By: Inez Catalina M.D.   On: 11/21/2020 19:37   (Echo, Carotid, EGD,  Colonoscopy, ERCP)    Subjective: Patient seen and examined.  Denies any complaints.  Sitting in chair.  Otherwise no other complaints.   Discharge Exam: Vitals:   12/01/20 0422 12/01/20 0806  BP: (!) 113/58 (!) 124/56  Pulse: 60 60  Resp: 17 19  Temp: 98.4 F (36.9 C) 97.8 F (36.6 C)  SpO2: 94% 94%   Vitals:   11/30/20 1628 11/30/20 1933 12/01/20 0422 12/01/20 0806  BP: (!) 143/62 (!) 128/59 (!) 113/58 (!) 124/56  Pulse: 60 (!) 59 60 60  Resp: 17 17 17 19   Temp: 98.1 F (36.7 C) 98.9 F (37.2 C) 98.4 F (36.9 C) 97.8 F (36.6 C)  TempSrc: Oral Oral Oral Oral  SpO2: 94% 95% 94% 94%  Weight:        General: Pt is alert, awake, not in acute distress, hard of hearing.  On room air. Cardiovascular: RRR, S1/S2 +, no rubs, no gallops Respiratory: CTA bilaterally, no wheezing, no rhonchi Abdominal: Soft, NT, ND, bowel sounds + Extremities: no edema, no cyanosis Right wrist on short arm cast.  Distal neurovascular status intact.    The results of significant diagnostics from this hospitalization (including imaging, microbiology, ancillary and laboratory) are listed below for reference.     Microbiology: Recent Results (from the past 240 hour(s))  Blood Culture (routine x 2)     Status:  Abnormal   Collection Time: 11/21/20  7:21 PM   Specimen: BLOOD RIGHT ARM  Result Value Ref Range Status   Specimen Description BLOOD RIGHT ARM  Final   Special Requests   Final    BOTTLES DRAWN AEROBIC AND ANAEROBIC Blood Culture adequate volume   Culture  Setup Time   Final    GRAM POSITIVE COCCI IN CLUSTERS AEROBIC BOTTLE ONLY CRITICAL RESULT CALLED TO, READ BACK BY AND VERIFIED WITH: PHARMD C AMEND AT 0350 11/23/2020 BY L BENFIELD    Culture (A)  Final    STAPHYLOCOCCUS HOMINIS THE SIGNIFICANCE OF ISOLATING THIS ORGANISM FROM A SINGLE SET OF BLOOD CULTURES WHEN MULTIPLE SETS ARE DRAWN IS UNCERTAIN. PLEASE NOTIFY THE MICROBIOLOGY DEPARTMENT WITHIN ONE WEEK IF SPECIATION AND SENSITIVITIES ARE REQUIRED. Performed at [facility], [place] [address], [place], [place] [address]    Report Status 11/25/2020 FINAL  Final  Blood Culture ID Panel (Reflexed)     Status: Abnormal   Collection Time: 11/21/20  7:21 PM  Result Value Ref Range Status   Enterococcus faecalis NOT DETECTED NOT DETECTED Final   Enterococcus Faecium NOT DETECTED NOT DETECTED Final   Listeria monocytogenes NOT DETECTED NOT DETECTED Final   Staphylococcus species DETECTED (A) NOT DETECTED Final    Comment: PHARMD C AMEND AT 8299 11/23/2020 BY L BENFIELD   Staphylococcus aureus (BCID) NOT DETECTED NOT DETECTED Final   Staphylococcus epidermidis NOT DETECTED NOT DETECTED Final   Staphylococcus lugdunensis NOT DETECTED NOT DETECTED Final   Streptococcus species NOT DETECTED NOT DETECTED Final   Streptococcus agalactiae NOT DETECTED NOT DETECTED Final   Streptococcus pneumoniae NOT DETECTED NOT DETECTED Final   Streptococcus pyogenes NOT DETECTED NOT DETECTED Final   A.calcoaceticus-baumannii NOT DETECTED NOT DETECTED Final   Bacteroides fragilis NOT DETECTED NOT DETECTED Final   Enterobacterales NOT DETECTED NOT DETECTED Final   Enterobacter cloacae complex NOT DETECTED NOT DETECTED Final   Escherichia coli NOT  DETECTED NOT DETECTED Final   Klebsiella aerogenes NOT DETECTED NOT DETECTED Final   Klebsiella oxytoca NOT DETECTED NOT DETECTED Final   Klebsiella pneumoniae NOT  DETECTED NOT DETECTED Final   Proteus species NOT DETECTED NOT DETECTED Final   Salmonella species NOT DETECTED NOT DETECTED Final   Serratia marcescens NOT DETECTED NOT DETECTED Final   Haemophilus influenzae NOT DETECTED NOT DETECTED Final   Neisseria meningitidis NOT DETECTED NOT DETECTED Final   Pseudomonas aeruginosa NOT DETECTED NOT DETECTED Final   Stenotrophomonas maltophilia NOT DETECTED NOT DETECTED Final   Candida albicans NOT DETECTED NOT DETECTED Final   Candida auris NOT DETECTED NOT DETECTED Final   Candida glabrata NOT DETECTED NOT DETECTED Final   Candida krusei NOT DETECTED NOT DETECTED Final   Candida parapsilosis NOT DETECTED NOT DETECTED Final   Candida tropicalis NOT DETECTED NOT DETECTED Final   Cryptococcus neoformans/gattii NOT DETECTED NOT DETECTED Final    Comment: Performed at [facility], [place] [address] [place], [place] [address]  Resp Panel by RT-PCR (Flu A&B, Covid) Nasopharyngeal Swab     Status: None   Collection Time: 11/21/20  7:30 PM   Specimen: Nasopharyngeal Swab; Nasopharyngeal(NP) swabs in vial transport medium  Result Value Ref Range Status   SARS Coronavirus 2 by RT PCR NEGATIVE NEGATIVE Final    Comment: (NOTE) SARS-CoV-2 target nucleic acids are NOT DETECTED.  The SARS-CoV-2 RNA is generally detectable in upper respiratory specimens during the acute phase of infection. The lowest concentration of SARS-CoV-2 viral copies this assay can detect is 138 copies/mL. A negative result does not preclude SARS-Cov-2 infection and should not be used as the sole basis for treatment or other patient management decisions. A negative result may occur with  improper specimen collection/handling, submission of specimen other than nasopharyngeal swab, presence of viral mutation(s) within  the areas targeted by this assay, and inadequate number of viral copies(<138 copies/mL). A negative result must be combined with clinical observations, patient history, and epidemiological information. The expected result is Negative.  Fact Sheet for Patients:  EntrepreneurPulse.com.au  Fact Sheet for Healthcare Providers:  IncredibleEmployment.be  This test is no t yet approved or cleared by the [place] FDA and  has been authorized for detection and/or diagnosis of SARS-CoV-2 by FDA under an Emergency Use Authorization (EUA). This EUA will remain  in effect (meaning this test can be used) for the duration of the COVID-19 declaration under Section 564(b)(1) of the Act, 21 U.S.C.section 360bbb-3(b)(1), unless the authorization is terminated  or revoked sooner.       Influenza A by PCR NEGATIVE NEGATIVE Final   Influenza B by PCR NEGATIVE NEGATIVE Final    Comment: (NOTE) The Xpert Xpress SARS-CoV-2/FLU/RSV plus assay is intended as an aid in the diagnosis of influenza from Nasopharyngeal swab specimens and should not be used as a sole basis for treatment. Nasal washings and aspirates are unacceptable for Xpert Xpress SARS-CoV-2/FLU/RSV testing.  Fact Sheet for Patients: EntrepreneurPulse.com.au  Fact Sheet for Healthcare Providers: IncredibleEmployment.be  This test is not yet approved or cleared by the [place] FDA and has been authorized for detection and/or diagnosis of SARS-CoV-2 by FDA under an Emergency Use Authorization (EUA). This EUA will remain in effect (meaning this test can be used) for the duration of the COVID-19 declaration under Section 564(b)(1) of the Act, 21 U.S.C. section 360bbb-3(b)(1), unless the authorization is terminated or revoked.  Performed at [facility], [place] [address], [place], [place] [address]   Blood Culture (routine x 2)     Status: None    Collection Time: 11/21/20  7:30 PM   Specimen: BLOOD LEFT WRIST  Result Value Ref Range Status   Specimen Description BLOOD LEFT WRIST  Final   Special Requests   Final    BOTTLES DRAWN AEROBIC AND ANAEROBIC Blood Culture results may not be optimal due to an inadequate volume of blood received in culture bottles   Culture   Final    NO GROWTH 5 DAYS Performed at [facility] [address], [place], [place] [address]    Report Status 11/26/2020 FINAL  Final  Urine Culture     Status: None   Collection Time: 11/22/20  7:42 AM   Specimen: In/Out Cath Urine  Result Value Ref Range Status   Specimen Description IN/OUT CATH URINE  Final   Special Requests NONE  Final   Culture   Final    NO GROWTH Performed at [facility], [place] [address] [place], [place] [address]    Report Status 11/23/2020 FINAL  Final  SARS CORONAVIRUS 2 (TAT 6-24 HRS) Nasopharyngeal Nasopharyngeal Swab     Status: None   Collection Time: 11/27/20  3:00 PM   Specimen: Nasopharyngeal Swab  Result Value Ref Range Status   SARS Coronavirus 2 NEGATIVE NEGATIVE Final    Comment: (NOTE) SARS-CoV-2 target nucleic acids are NOT DETECTED.  The SARS-CoV-2 RNA is generally detectable in upper and lower respiratory specimens during the acute phase of infection. Negative results do not preclude SARS-CoV-2 infection, do not rule out co-infections with other pathogens, and should not be used as the sole basis for treatment or other patient management decisions. Negative results must be combined with clinical observations, patient history, and epidemiological information. The expected result is Negative.  Fact Sheet for Patients: SugarRoll.be  Fact Sheet for Healthcare Providers: https://www.woods-mathews.com/  This test is not yet approved or cleared by the [place] FDA and  has been authorized for detection and/or diagnosis of SARS-CoV-2 by FDA under an  Emergency Use Authorization (EUA). This EUA will remain  in effect (meaning this test can be used) for the duration of the COVID-19 declaration under Se ction 564(b)(1) of the Act, 21 U.S.C. section 360bbb-3(b)(1), unless the authorization is terminated or revoked sooner.  Performed at [facility], [place] [address], [place], [place] [address]      Labs: BNP (last 3 results) No results for input(s): BNP in the last 8760 hours. Basic Metabolic Panel: Recent Labs  Lab 11/25/20 0146 11/26/20 0214 11/27/20 0136 11/28/20 0145 11/29/20 0111 11/30/20 0108  NA 134* 129* 136 135 135  --   K 3.5 3.5 3.5 3.7 3.9  --   CL 101 97* 101 103 104  --   CO2 23 24 23 24 24   --   GLUCOSE 119* 120* 115* 124* 103*  --   BUN 21 29* 41* 45* 40*  --   CREATININE 1.14* 1.18* 1.35* 1.46* 1.26* 1.27*  CALCIUM 8.5* 8.0* 8.2* 8.4* 8.7*  --   MG 1.5* 2.4 2.0 2.0 2.0  --    Liver Function Tests: No results for input(s): AST, ALT, ALKPHOS, BILITOT, PROT, ALBUMIN in the last 168 hours. No results for input(s): LIPASE, AMYLASE in the last 168 hours. No results for input(s): AMMONIA in the last 168 hours. CBC: Recent Labs  Lab 11/27/20 0136 11/28/20 0145 11/29/20 0111  WBC 6.6 6.7 5.1  NEUTROABS 4.5 4.3 3.0  HGB 9.3* 9.1* 9.2*  HCT 28.9* 28.3* 29.0*  MCV 101.0* 100.7* 102.5*  PLT 192 210 227   Cardiac Enzymes: No results for input(s):  CKTOTAL, CKMB, CKMBINDEX, TROPONINI in the last 168 hours. BNP: Invalid input(s): POCBNP CBG: No results for input(s): GLUCAP in the last 168 hours. D-Dimer No results for input(s): DDIMER in the last 72 hours. Hgb A1c No results for input(s): HGBA1C in the last 72 hours. Lipid Profile No results for input(s): CHOL, HDL, LDLCALC, TRIG, CHOLHDL, LDLDIRECT in the last 72 hours. Thyroid function studies No results for input(s): TSH, T4TOTAL, T3FREE, THYROIDAB in the last 72 hours.  Invalid input(s): FREET3 Anemia work up No results for input(s):  VITAMINB12, FOLATE, FERRITIN, TIBC, IRON, RETICCTPCT in the last 72 hours. Urinalysis    Component Value Date/Time   COLORURINE YELLOW 11/22/2020 0742   APPEARANCEUR HAZY (A) 11/22/2020 0742   LABSPEC 1.019 11/22/2020 0742   PHURINE 6.0 11/22/2020 0742   GLUCOSEU NEGATIVE 11/22/2020 0742   HGBUR SMALL (A) 11/22/2020 0742   BILIRUBINUR NEGATIVE 11/22/2020 0742   KETONESUR NEGATIVE 11/22/2020 0742   PROTEINUR 100 (A) 11/22/2020 0742   UROBILINOGEN 0.2 07/20/2013 1457   NITRITE NEGATIVE 11/22/2020 0742   LEUKOCYTESUR NEGATIVE 11/22/2020 0742   Sepsis Labs Invalid input(s): PROCALCITONIN,  WBC,  LACTICIDVEN Microbiology Recent Results (from the past 240 hour(s))  Blood Culture (routine x 2)     Status: Abnormal   Collection Time: 11/21/20  7:21 PM   Specimen: BLOOD RIGHT ARM  Result Value Ref Range Status   Specimen Description BLOOD RIGHT ARM  Final   Special Requests   Final    BOTTLES DRAWN AEROBIC AND ANAEROBIC Blood Culture adequate volume   Culture  Setup Time   Final    GRAM POSITIVE COCCI IN CLUSTERS AEROBIC BOTTLE ONLY CRITICAL RESULT CALLED TO, READ BACK BY AND VERIFIED WITH: PHARMD C AMEND AT 7517 11/23/2020 BY L BENFIELD    Culture (A)  Final    STAPHYLOCOCCUS HOMINIS THE SIGNIFICANCE OF ISOLATING THIS ORGANISM FROM A SINGLE SET OF BLOOD CULTURES WHEN MULTIPLE SETS ARE DRAWN IS UNCERTAIN. PLEASE NOTIFY THE MICROBIOLOGY DEPARTMENT WITHIN ONE WEEK IF SPECIATION AND SENSITIVITIES ARE REQUIRED. Performed at [facility], [place] [address], [place], [place] [address]    Report Status 11/25/2020 FINAL  Final  Blood Culture ID Panel (Reflexed)     Status: Abnormal   Collection Time: 11/21/20  7:21 PM  Result Value Ref Range Status   Enterococcus faecalis NOT DETECTED NOT DETECTED Final   Enterococcus Faecium NOT DETECTED NOT DETECTED Final   Listeria monocytogenes NOT DETECTED NOT DETECTED Final   Staphylococcus species DETECTED (A) NOT DETECTED Final    Comment:  PHARMD C AMEND AT 9449 11/23/2020 BY L BENFIELD   Staphylococcus aureus (BCID) NOT DETECTED NOT DETECTED Final   Staphylococcus epidermidis NOT DETECTED NOT DETECTED Final   Staphylococcus lugdunensis NOT DETECTED NOT DETECTED Final   Streptococcus species NOT DETECTED NOT DETECTED Final   Streptococcus agalactiae NOT DETECTED NOT DETECTED Final   Streptococcus pneumoniae NOT DETECTED NOT DETECTED Final   Streptococcus pyogenes NOT DETECTED NOT DETECTED Final   A.calcoaceticus-baumannii NOT DETECTED NOT DETECTED Final   Bacteroides fragilis NOT DETECTED NOT DETECTED Final   Enterobacterales NOT DETECTED NOT DETECTED Final   Enterobacter cloacae complex NOT DETECTED NOT DETECTED Final   Escherichia coli NOT DETECTED NOT DETECTED Final   Klebsiella aerogenes NOT DETECTED NOT DETECTED Final   Klebsiella oxytoca NOT DETECTED NOT DETECTED Final   Klebsiella pneumoniae NOT DETECTED NOT DETECTED Final   Proteus species NOT DETECTED NOT DETECTED Final   Salmonella species NOT DETECTED NOT DETECTED Final  Serratia marcescens NOT DETECTED NOT DETECTED Final   Haemophilus influenzae NOT DETECTED NOT DETECTED Final   Neisseria meningitidis NOT DETECTED NOT DETECTED Final   Pseudomonas aeruginosa NOT DETECTED NOT DETECTED Final   Stenotrophomonas maltophilia NOT DETECTED NOT DETECTED Final   Candida albicans NOT DETECTED NOT DETECTED Final   Candida auris NOT DETECTED NOT DETECTED Final   Candida glabrata NOT DETECTED NOT DETECTED Final   Candida krusei NOT DETECTED NOT DETECTED Final   Candida parapsilosis NOT DETECTED NOT DETECTED Final   Candida tropicalis NOT DETECTED NOT DETECTED Final   Cryptococcus neoformans/gattii NOT DETECTED NOT DETECTED Final    Comment: Performed at [facility], [place] [address] [place], [place] [address]  Resp Panel by RT-PCR (Flu A&B, Covid) Nasopharyngeal Swab     Status: None   Collection Time: 11/21/20  7:30 PM   Specimen: Nasopharyngeal Swab;  Nasopharyngeal(NP) swabs in vial transport medium  Result Value Ref Range Status   SARS Coronavirus 2 by RT PCR NEGATIVE NEGATIVE Final    Comment: (NOTE) SARS-CoV-2 target nucleic acids are NOT DETECTED.  The SARS-CoV-2 RNA is generally detectable in upper respiratory specimens during the acute phase of infection. The lowest concentration of SARS-CoV-2 viral copies this assay can detect is 138 copies/mL. A negative result does not preclude SARS-Cov-2 infection and should not be used as the sole basis for treatment or other patient management decisions. A negative result may occur with  improper specimen collection/handling, submission of specimen other than nasopharyngeal swab, presence of viral mutation(s) within the areas targeted by this assay, and inadequate number of viral copies(<138 copies/mL). A negative result must be combined with clinical observations, patient history, and epidemiological information. The expected result is Negative.  Fact Sheet for Patients:  EntrepreneurPulse.com.au  Fact Sheet for Healthcare Providers:  IncredibleEmployment.be  This test is no t yet approved or cleared by the [place] FDA and  has been authorized for detection and/or diagnosis of SARS-CoV-2 by FDA under an Emergency Use Authorization (EUA). This EUA will remain  in effect (meaning this test can be used) for the duration of the COVID-19 declaration under Section 564(b)(1) of the Act, 21 U.S.C.section 360bbb-3(b)(1), unless the authorization is terminated  or revoked sooner.       Influenza A by PCR NEGATIVE NEGATIVE Final   Influenza B by PCR NEGATIVE NEGATIVE Final    Comment: (NOTE) The Xpert Xpress SARS-CoV-2/FLU/RSV plus assay is intended as an aid in the diagnosis of influenza from Nasopharyngeal swab specimens and should not be used as a sole basis for treatment. Nasal washings and aspirates are unacceptable for Xpert Xpress  SARS-CoV-2/FLU/RSV testing.  Fact Sheet for Patients: EntrepreneurPulse.com.au  Fact Sheet for Healthcare Providers: IncredibleEmployment.be  This test is not yet approved or cleared by the [place] FDA and has been authorized for detection and/or diagnosis of SARS-CoV-2 by FDA under an Emergency Use Authorization (EUA). This EUA will remain in effect (meaning this test can be used) for the duration of the COVID-19 declaration under Section 564(b)(1) of the Act, 21 U.S.C. section 360bbb-3(b)(1), unless the authorization is terminated or revoked.  Performed at [facility], [place] [address] [place], [place] [address]   Blood Culture (routine x 2)     Status: None   Collection Time: 11/21/20  7:30 PM   Specimen: BLOOD LEFT WRIST  Result Value Ref Range Status   Specimen Description BLOOD LEFT WRIST  Final   Special Requests   Final  BOTTLES DRAWN AEROBIC AND ANAEROBIC Blood Culture results may not be optimal due to an inadequate volume of blood received in culture bottles   Culture   Final    NO GROWTH 5 DAYS Performed at [facility], [place] [address], [place], [place] [address]    Report Status 11/26/2020 FINAL  Final  Urine Culture     Status: None   Collection Time: 11/22/20  7:42 AM   Specimen: In/Out Cath Urine  Result Value Ref Range Status   Specimen Description IN/OUT CATH URINE  Final   Special Requests NONE  Final   Culture   Final    NO GROWTH Performed at [facility], [place] [address] [place], [place] [address]    Report Status 11/23/2020 FINAL  Final  SARS CORONAVIRUS 2 (TAT 6-24 HRS) Nasopharyngeal Nasopharyngeal Swab     Status: None   Collection Time: 11/27/20  3:00 PM   Specimen: Nasopharyngeal Swab  Result Value Ref Range Status   SARS Coronavirus 2 NEGATIVE NEGATIVE Final    Comment: (NOTE) SARS-CoV-2 target nucleic acids are NOT DETECTED.  The SARS-CoV-2 RNA is generally detectable in upper  and lower respiratory specimens during the acute phase of infection. Negative results do not preclude SARS-CoV-2 infection, do not rule out co-infections with other pathogens, and should not be used as the sole basis for treatment or other patient management decisions. Negative results must be combined with clinical observations, patient history, and epidemiological information. The expected result is Negative.  Fact Sheet for Patients: SugarRoll.be  Fact Sheet for Healthcare Providers: https://www.woods-mathews.com/  This test is not yet approved or cleared by the [place] FDA and  has been authorized for detection and/or diagnosis of SARS-CoV-2 by FDA under an Emergency Use Authorization (EUA). This EUA will remain  in effect (meaning this test can be used) for the duration of the COVID-19 declaration under Se ction 564(b)(1) of the Act, 21 U.S.C. section 360bbb-3(b)(1), unless the authorization is terminated or revoked sooner.  Performed at [facility], [place] [address] [place], [place] [address]      Time coordinating discharge:  35 minutes  SIGNED:   Barb Merino, MD  [facility] 12/01/2020, 11:02 AM

## 2020-12-01 NOTE — Progress Notes (Signed)
Mobility Specialist Progress Note:   12/01/20 1358  Mobility  Activity Ambulated in hall  Level of Assistance Standby assist, set-up cues, supervision of patient - no hands on  Assistive Device Front wheel walker  Distance Ambulated (ft) 80 ft  Mobility Ambulated with assistance in hallway  Mobility Response Tolerated well  Mobility performed by Mobility specialist  Bed Position Chair  $Mobility charge 1 Mobility   Pt received in chair willing to participate in mobility. Complaints of 3/10 leg pain. No SOB. Pt returned to chair with call bell in reach and all needs met.   [facility] Health and safety inspector Phone [phone]

## 2020-12-02 DIAGNOSIS — M1990 Unspecified osteoarthritis, unspecified site: Secondary | ICD-10-CM | POA: Diagnosis not present

## 2020-12-02 DIAGNOSIS — E782 Mixed hyperlipidemia: Secondary | ICD-10-CM | POA: Diagnosis not present

## 2020-12-02 DIAGNOSIS — J441 Chronic obstructive pulmonary disease with (acute) exacerbation: Secondary | ICD-10-CM | POA: Diagnosis not present

## 2020-12-02 DIAGNOSIS — I4891 Unspecified atrial fibrillation: Secondary | ICD-10-CM | POA: Diagnosis not present

## 2020-12-02 DIAGNOSIS — K219 Gastro-esophageal reflux disease without esophagitis: Secondary | ICD-10-CM | POA: Diagnosis not present

## 2020-12-02 DIAGNOSIS — J411 Mucopurulent chronic bronchitis: Secondary | ICD-10-CM | POA: Diagnosis not present

## 2020-12-02 DIAGNOSIS — M810 Age-related osteoporosis without current pathological fracture: Secondary | ICD-10-CM | POA: Diagnosis not present

## 2020-12-02 DIAGNOSIS — I129 Hypertensive chronic kidney disease with stage 1 through stage 4 chronic kidney disease, or unspecified chronic kidney disease: Secondary | ICD-10-CM | POA: Diagnosis not present

## 2020-12-02 DIAGNOSIS — I10 Essential (primary) hypertension: Secondary | ICD-10-CM | POA: Diagnosis not present

## 2020-12-02 DIAGNOSIS — R69 Illness, unspecified: Secondary | ICD-10-CM | POA: Diagnosis not present

## 2020-12-03 DIAGNOSIS — I5032 Chronic diastolic (congestive) heart failure: Secondary | ICD-10-CM | POA: Diagnosis not present

## 2020-12-03 DIAGNOSIS — M25531 Pain in right wrist: Secondary | ICD-10-CM | POA: Diagnosis not present

## 2020-12-09 DIAGNOSIS — D649 Anemia, unspecified: Secondary | ICD-10-CM | POA: Diagnosis not present

## 2020-12-09 DIAGNOSIS — M25531 Pain in right wrist: Secondary | ICD-10-CM | POA: Diagnosis not present

## 2020-12-09 DIAGNOSIS — E875 Hyperkalemia: Secondary | ICD-10-CM | POA: Diagnosis not present

## 2020-12-09 DIAGNOSIS — M109 Gout, unspecified: Secondary | ICD-10-CM | POA: Diagnosis not present

## 2020-12-09 DIAGNOSIS — S52501A Unspecified fracture of the lower end of right radius, initial encounter for closed fracture: Secondary | ICD-10-CM | POA: Diagnosis not present

## 2020-12-09 DIAGNOSIS — G9341 Metabolic encephalopathy: Secondary | ICD-10-CM | POA: Diagnosis not present

## 2020-12-09 DIAGNOSIS — K219 Gastro-esophageal reflux disease without esophagitis: Secondary | ICD-10-CM | POA: Diagnosis not present

## 2020-12-09 DIAGNOSIS — J189 Pneumonia, unspecified organism: Secondary | ICD-10-CM | POA: Diagnosis not present

## 2020-12-09 DIAGNOSIS — R102 Pelvic and perineal pain: Secondary | ICD-10-CM | POA: Diagnosis not present

## 2020-12-09 DIAGNOSIS — M6530 Trigger finger, unspecified finger: Secondary | ICD-10-CM | POA: Diagnosis not present

## 2020-12-09 DIAGNOSIS — S72009A Fracture of unspecified part of neck of unspecified femur, initial encounter for closed fracture: Secondary | ICD-10-CM | POA: Diagnosis not present

## 2020-12-09 DIAGNOSIS — I480 Paroxysmal atrial fibrillation: Secondary | ICD-10-CM | POA: Diagnosis not present

## 2020-12-09 DIAGNOSIS — K5900 Constipation, unspecified: Secondary | ICD-10-CM | POA: Diagnosis not present

## 2020-12-09 DIAGNOSIS — I10 Essential (primary) hypertension: Secondary | ICD-10-CM | POA: Diagnosis not present

## 2020-12-15 DIAGNOSIS — M6530 Trigger finger, unspecified finger: Secondary | ICD-10-CM | POA: Diagnosis not present

## 2020-12-15 DIAGNOSIS — M25531 Pain in right wrist: Secondary | ICD-10-CM | POA: Diagnosis not present

## 2020-12-15 DIAGNOSIS — S52501A Unspecified fracture of the lower end of right radius, initial encounter for closed fracture: Secondary | ICD-10-CM | POA: Diagnosis not present

## 2020-12-15 DIAGNOSIS — R102 Pelvic and perineal pain: Secondary | ICD-10-CM | POA: Diagnosis not present

## 2020-12-26 DIAGNOSIS — Z9181 History of falling: Secondary | ICD-10-CM | POA: Diagnosis not present

## 2020-12-26 DIAGNOSIS — Z7689 Persons encountering health services in other specified circumstances: Secondary | ICD-10-CM | POA: Diagnosis not present

## 2020-12-26 DIAGNOSIS — R269 Unspecified abnormalities of gait and mobility: Secondary | ICD-10-CM | POA: Diagnosis not present

## 2020-12-28 DIAGNOSIS — S52501D Unspecified fracture of the lower end of right radius, subsequent encounter for closed fracture with routine healing: Secondary | ICD-10-CM | POA: Diagnosis not present

## 2020-12-28 DIAGNOSIS — Z950 Presence of cardiac pacemaker: Secondary | ICD-10-CM | POA: Diagnosis not present

## 2020-12-28 DIAGNOSIS — I480 Paroxysmal atrial fibrillation: Secondary | ICD-10-CM | POA: Diagnosis not present

## 2020-12-28 DIAGNOSIS — E785 Hyperlipidemia, unspecified: Secondary | ICD-10-CM | POA: Diagnosis not present

## 2020-12-28 DIAGNOSIS — I10 Essential (primary) hypertension: Secondary | ICD-10-CM | POA: Diagnosis not present

## 2020-12-28 DIAGNOSIS — M109 Gout, unspecified: Secondary | ICD-10-CM | POA: Diagnosis not present

## 2020-12-28 DIAGNOSIS — I130 Hypertensive heart and chronic kidney disease with heart failure and stage 1 through stage 4 chronic kidney disease, or unspecified chronic kidney disease: Secondary | ICD-10-CM | POA: Diagnosis not present

## 2020-12-28 DIAGNOSIS — J45909 Unspecified asthma, uncomplicated: Secondary | ICD-10-CM | POA: Diagnosis not present

## 2020-12-28 DIAGNOSIS — N1831 Chronic kidney disease, stage 3a: Secondary | ICD-10-CM | POA: Diagnosis not present

## 2020-12-28 DIAGNOSIS — J188 Other pneumonia, unspecified organism: Secondary | ICD-10-CM | POA: Diagnosis not present

## 2020-12-28 DIAGNOSIS — I5032 Chronic diastolic (congestive) heart failure: Secondary | ICD-10-CM | POA: Diagnosis not present

## 2020-12-31 ENCOUNTER — Ambulatory Visit (INDEPENDENT_AMBULATORY_CARE_PROVIDER_SITE_OTHER): Payer: Medicare HMO

## 2020-12-31 DIAGNOSIS — I10 Essential (primary) hypertension: Secondary | ICD-10-CM | POA: Diagnosis not present

## 2020-12-31 DIAGNOSIS — I495 Sick sinus syndrome: Secondary | ICD-10-CM | POA: Diagnosis not present

## 2020-12-31 DIAGNOSIS — R69 Illness, unspecified: Secondary | ICD-10-CM | POA: Diagnosis not present

## 2020-12-31 DIAGNOSIS — I2510 Atherosclerotic heart disease of native coronary artery without angina pectoris: Secondary | ICD-10-CM | POA: Diagnosis not present

## 2020-12-31 DIAGNOSIS — E782 Mixed hyperlipidemia: Secondary | ICD-10-CM | POA: Diagnosis not present

## 2020-12-31 DIAGNOSIS — I5032 Chronic diastolic (congestive) heart failure: Secondary | ICD-10-CM | POA: Diagnosis not present

## 2020-12-31 DIAGNOSIS — J411 Mucopurulent chronic bronchitis: Secondary | ICD-10-CM | POA: Diagnosis not present

## 2020-12-31 DIAGNOSIS — D649 Anemia, unspecified: Secondary | ICD-10-CM | POA: Diagnosis not present

## 2020-12-31 DIAGNOSIS — I129 Hypertensive chronic kidney disease with stage 1 through stage 4 chronic kidney disease, or unspecified chronic kidney disease: Secondary | ICD-10-CM | POA: Diagnosis not present

## 2020-12-31 DIAGNOSIS — J441 Chronic obstructive pulmonary disease with (acute) exacerbation: Secondary | ICD-10-CM | POA: Diagnosis not present

## 2020-12-31 DIAGNOSIS — I4891 Unspecified atrial fibrillation: Secondary | ICD-10-CM | POA: Diagnosis not present

## 2021-01-04 LAB — CUP PACEART REMOTE DEVICE CHECK
Battery Remaining Longevity: 66 mo
Battery Remaining Percentage: 100 %
Brady Statistic RA Percent Paced: 76 %
Brady Statistic RV Percent Paced: 0 %
Date Time Interrogation Session: 20221123033300
Implantable Lead Implant Date: 20180524
Implantable Lead Implant Date: 20180524
Implantable Lead Location: 753859
Implantable Lead Location: 753860
Implantable Lead Model: 7740
Implantable Lead Model: 7741
Implantable Lead Serial Number: 693708
Implantable Lead Serial Number: 861246
Implantable Pulse Generator Implant Date: 20180524
Lead Channel Impedance Value: 552 Ohm
Lead Channel Impedance Value: 569 Ohm
Lead Channel Pacing Threshold Amplitude: 0.4 V
Lead Channel Pacing Threshold Amplitude: 1.4 V
Lead Channel Pacing Threshold Pulse Width: 0.4 ms
Lead Channel Pacing Threshold Pulse Width: 0.4 ms
Lead Channel Setting Pacing Amplitude: 2 V
Lead Channel Setting Pacing Amplitude: 2.4 V
Lead Channel Setting Pacing Pulse Width: 0.4 ms
Lead Channel Setting Sensing Sensitivity: 2 mV
Pulse Gen Serial Number: 308044

## 2021-01-05 ENCOUNTER — Emergency Department (HOSPITAL_COMMUNITY): Payer: Medicare HMO

## 2021-01-05 ENCOUNTER — Emergency Department (HOSPITAL_COMMUNITY)
Admission: EM | Admit: 2021-01-05 | Discharge: 2021-01-12 | Disposition: A | Discharge status: Home / Self Care | Payer: Medicare HMO | Attending: Emergency Medicine | Admitting: Emergency Medicine

## 2021-01-05 DIAGNOSIS — Z7951 Long term (current) use of inhaled steroids: Secondary | ICD-10-CM | POA: Insufficient documentation

## 2021-01-05 DIAGNOSIS — Y92009 Unspecified place in unspecified non-institutional (private) residence as the place of occurrence of the external cause: Secondary | ICD-10-CM | POA: Insufficient documentation

## 2021-01-05 DIAGNOSIS — Z79899 Other long term (current) drug therapy: Secondary | ICD-10-CM | POA: Diagnosis not present

## 2021-01-05 DIAGNOSIS — Z743 Need for continuous supervision: Secondary | ICD-10-CM | POA: Diagnosis not present

## 2021-01-05 DIAGNOSIS — M25519 Pain in unspecified shoulder: Secondary | ICD-10-CM | POA: Diagnosis not present

## 2021-01-05 DIAGNOSIS — D631 Anemia in chronic kidney disease: Secondary | ICD-10-CM | POA: Diagnosis not present

## 2021-01-05 DIAGNOSIS — R918 Other nonspecific abnormal finding of lung field: Secondary | ICD-10-CM | POA: Diagnosis not present

## 2021-01-05 DIAGNOSIS — W010XXA Fall on same level from slipping, tripping and stumbling without subsequent striking against object, initial encounter: Secondary | ICD-10-CM | POA: Diagnosis not present

## 2021-01-05 DIAGNOSIS — N183 Chronic kidney disease, stage 3 unspecified: Secondary | ICD-10-CM | POA: Insufficient documentation

## 2021-01-05 DIAGNOSIS — J45909 Unspecified asthma, uncomplicated: Secondary | ICD-10-CM | POA: Insufficient documentation

## 2021-01-05 DIAGNOSIS — M7989 Other specified soft tissue disorders: Secondary | ICD-10-CM | POA: Diagnosis not present

## 2021-01-05 DIAGNOSIS — I5032 Chronic diastolic (congestive) heart failure: Secondary | ICD-10-CM | POA: Diagnosis not present

## 2021-01-05 DIAGNOSIS — Z20822 Contact with and (suspected) exposure to covid-19: Secondary | ICD-10-CM | POA: Insufficient documentation

## 2021-01-05 DIAGNOSIS — I130 Hypertensive heart and chronic kidney disease with heart failure and stage 1 through stage 4 chronic kidney disease, or unspecified chronic kidney disease: Secondary | ICD-10-CM | POA: Diagnosis not present

## 2021-01-05 DIAGNOSIS — R001 Bradycardia, unspecified: Secondary | ICD-10-CM | POA: Diagnosis not present

## 2021-01-05 DIAGNOSIS — S0990XA Unspecified injury of head, initial encounter: Secondary | ICD-10-CM | POA: Diagnosis not present

## 2021-01-05 DIAGNOSIS — Z96652 Presence of left artificial knee joint: Secondary | ICD-10-CM | POA: Insufficient documentation

## 2021-01-05 DIAGNOSIS — S42202A Unspecified fracture of upper end of left humerus, initial encounter for closed fracture: Secondary | ICD-10-CM | POA: Diagnosis not present

## 2021-01-05 DIAGNOSIS — M1612 Unilateral primary osteoarthritis, left hip: Secondary | ICD-10-CM | POA: Diagnosis not present

## 2021-01-05 DIAGNOSIS — S32599A Other specified fracture of unspecified pubis, initial encounter for closed fracture: Secondary | ICD-10-CM

## 2021-01-05 DIAGNOSIS — J9811 Atelectasis: Secondary | ICD-10-CM | POA: Diagnosis not present

## 2021-01-05 DIAGNOSIS — S32512A Fracture of superior rim of left pubis, initial encounter for closed fracture: Secondary | ICD-10-CM | POA: Diagnosis not present

## 2021-01-05 DIAGNOSIS — M4312 Spondylolisthesis, cervical region: Secondary | ICD-10-CM | POA: Diagnosis not present

## 2021-01-05 DIAGNOSIS — M533 Sacrococcygeal disorders, not elsewhere classified: Secondary | ICD-10-CM | POA: Diagnosis not present

## 2021-01-05 DIAGNOSIS — I4891 Unspecified atrial fibrillation: Secondary | ICD-10-CM | POA: Insufficient documentation

## 2021-01-05 DIAGNOSIS — M545 Low back pain, unspecified: Secondary | ICD-10-CM | POA: Diagnosis not present

## 2021-01-05 DIAGNOSIS — S4991XA Unspecified injury of right shoulder and upper arm, initial encounter: Secondary | ICD-10-CM | POA: Diagnosis present

## 2021-01-05 DIAGNOSIS — M47816 Spondylosis without myelopathy or radiculopathy, lumbar region: Secondary | ICD-10-CM | POA: Diagnosis not present

## 2021-01-05 DIAGNOSIS — R0902 Hypoxemia: Secondary | ICD-10-CM | POA: Diagnosis not present

## 2021-01-05 DIAGNOSIS — S2232XA Fracture of one rib, left side, initial encounter for closed fracture: Secondary | ICD-10-CM | POA: Diagnosis not present

## 2021-01-05 DIAGNOSIS — Z90710 Acquired absence of both cervix and uterus: Secondary | ICD-10-CM | POA: Diagnosis not present

## 2021-01-05 DIAGNOSIS — Z85828 Personal history of other malignant neoplasm of skin: Secondary | ICD-10-CM | POA: Insufficient documentation

## 2021-01-05 DIAGNOSIS — S42302A Unspecified fracture of shaft of humerus, left arm, initial encounter for closed fracture: Secondary | ICD-10-CM | POA: Diagnosis not present

## 2021-01-05 DIAGNOSIS — Z7982 Long term (current) use of aspirin: Secondary | ICD-10-CM | POA: Diagnosis not present

## 2021-01-05 DIAGNOSIS — J811 Chronic pulmonary edema: Secondary | ICD-10-CM | POA: Diagnosis not present

## 2021-01-05 DIAGNOSIS — R29890 Loss of height: Secondary | ICD-10-CM | POA: Diagnosis not present

## 2021-01-05 DIAGNOSIS — R110 Nausea: Secondary | ICD-10-CM | POA: Diagnosis not present

## 2021-01-05 DIAGNOSIS — Z87891 Personal history of nicotine dependence: Secondary | ICD-10-CM | POA: Insufficient documentation

## 2021-01-05 DIAGNOSIS — S32592A Other specified fracture of left pubis, initial encounter for closed fracture: Secondary | ICD-10-CM | POA: Diagnosis not present

## 2021-01-05 DIAGNOSIS — M25552 Pain in left hip: Secondary | ICD-10-CM | POA: Diagnosis not present

## 2021-01-05 LAB — CBC WITH DIFFERENTIAL/PLATELET
Abs Immature Granulocytes: 0.03 10*3/uL (ref 0.00–0.07)
Basophils Absolute: 0 10*3/uL (ref 0.0–0.1)
Basophils Relative: 0 %
Eosinophils Absolute: 0.1 10*3/uL (ref 0.0–0.5)
Eosinophils Relative: 1 %
HCT: 31.7 % — ABNORMAL LOW (ref 36.0–46.0)
Hemoglobin: 9.8 g/dL — ABNORMAL LOW (ref 12.0–15.0)
Immature Granulocytes: 1 %
Lymphocytes Relative: 8 %
Lymphs Abs: 0.5 10*3/uL — ABNORMAL LOW (ref 0.7–4.0)
MCH: 32.6 pg (ref 26.0–34.0)
MCHC: 30.9 g/dL (ref 30.0–36.0)
MCV: 105.3 fL — ABNORMAL HIGH (ref 80.0–100.0)
Monocytes Absolute: 0.5 10*3/uL (ref 0.1–1.0)
Monocytes Relative: 7 %
Neutro Abs: 5.4 10*3/uL (ref 1.7–7.7)
Neutrophils Relative %: 83 %
Platelets: 122 10*3/uL — ABNORMAL LOW (ref 150–400)
RBC: 3.01 MIL/uL — ABNORMAL LOW (ref 3.87–5.11)
RDW: 14.5 % (ref 11.5–15.5)
WBC: 6.4 10*3/uL (ref 4.0–10.5)
nRBC: 0 % (ref 0.0–0.2)

## 2021-01-05 LAB — BASIC METABOLIC PANEL
Anion gap: 9 (ref 5–15)
BUN: 22 mg/dL (ref 8–23)
CO2: 21 mmol/L — ABNORMAL LOW (ref 22–32)
Calcium: 8.4 mg/dL — ABNORMAL LOW (ref 8.9–10.3)
Chloride: 112 mmol/L — ABNORMAL HIGH (ref 98–111)
Creatinine, Ser: 1.1 mg/dL — ABNORMAL HIGH (ref 0.44–1.00)
GFR, Estimated: 47 mL/min — ABNORMAL LOW (ref >60)
Glucose, Bld: 139 mg/dL — ABNORMAL HIGH (ref 70–99)
Potassium: 4.2 mmol/L (ref 3.5–5.1)
Sodium: 142 mmol/L (ref 135–145)

## 2021-01-05 MED ORDER — HYDROCODONE-ACETAMINOPHEN 5-325 MG PO TABS
1.0000 | ORAL_TABLET | Freq: Once | ORAL | Status: AC
  Administered 2021-01-05: 23:00:00 1 via ORAL

## 2021-01-05 MED ORDER — MORPHINE SULFATE (PF) 4 MG/ML IV SOLN
4.0000 mg | Freq: Once | INTRAVENOUS | Status: AC
  Administered 2021-01-05: 14:00:00 4 mg via INTRAVENOUS
  Filled 2021-01-05: qty 1

## 2021-01-05 MED ORDER — HYDROCODONE-ACETAMINOPHEN 5-325 MG PO TABS
1.0000 | ORAL_TABLET | Freq: Once | ORAL | Status: AC
Start: 2021-01-05 — End: 2021-01-05
  Administered 2021-01-05: 17:00:00 1 via ORAL
  Filled 2021-01-05: qty 1

## 2021-01-05 MED ORDER — MORPHINE SULFATE (PF) 2 MG/ML IV SOLN
4.0000 mg | Freq: Once | INTRAVENOUS | Status: DC
Start: 2021-01-05 — End: 2021-01-05

## 2021-01-05 MED ORDER — HYDROCODONE-ACETAMINOPHEN 5-325 MG PO TABS
ORAL_TABLET | ORAL | Status: AC
  Filled 2021-01-05: qty 1

## 2021-01-05 NOTE — ED Notes (Signed)
Pt placed on purwick, reporting pain

## 2021-01-05 NOTE — Social Work (Signed)
CSW consulted. Per chart review, Pt was sent to SNF from [facility] on 10/24.  CSW attempted to reach daughter via phone @ [phone]. Voicemail full.

## 2021-01-05 NOTE — Progress Notes (Signed)
Orthopedic Tech Progress Note Patient Details:  [name] 1927/09/23 [id number]  Ortho Devices Type of Ortho Device: Sling immobilizer Ortho Device/Splint Location: RUE Ortho Device/Splint Interventions: Ordered, Application, Adjustment   Post Interventions Patient Tolerated: Fair Instructions Provided: Adjustment of device, Care of device Immobilizer applied. [name] 01/05/2021, 6:56 PM

## 2021-01-05 NOTE — ED Notes (Signed)
Ortho tech called 

## 2021-01-05 NOTE — ED Provider Notes (Signed)
[facility] EMERGENCY DEPARTMENT Provider Note   CSN: [id number] Arrival date & time: 01/05/21  1322     History Chief Complaint  Patient presents with   [name] is a 87 y.o. female.   Fall   Patient presents to the ED for evaluation after a fall.   Patient states her toe got caught on sheet and she ended up tripping and falling.  She landed on the left side injuring her left shoulder.  Pain is severe.  Patient was not able to get up on her own so EMS was called.  This injury occurred about 30 minutes prior to arrival.  She does not complain of loss of consciousness.  She denies any lower extremity pain.  No chest pain or shortness of breath.  No numbness or weakness.  Past Medical History:  Diagnosis Date   Anemia    "I stay anemic"   Arthritis    Asthma    no problems recent   Cataract    Chronic diastolic CHF (congestive heart failure), NYHA class 2 ([place]) 03/19/2014   Echo 4/19: mod LVH, EF 55-60, no RWMA, Gr 1 DD, MAC, trivial MR, mod LAE, normal RVSF, PASP 19   CKD (chronic kidney disease), stage III (HCC)    Complication of anesthesia    CONFUSED AFTER KNEE SURGERY   Depression    Diverticulitis    DVT (deep venous thrombosis) (HCC)    from birth control, left groin   Essential hypertension    Fibromyalgia 10 y.a.   GERD (gastroesophageal reflux disease)    Gout    Hx of cardiovascular stress test    Lexiscan Myoview (8/15): apical attenuation artifact, no ischemia, EF 69% - low risk.   Hypercholesteremia    Odynophagia    Other abnormal glucose    PAF (paroxysmal atrial fibrillation) (HCC)    Personal history of other diseases of digestive system    Pneumonia    hx of   Presence of permanent cardiac pacemaker    Sinus bradycardia    Skin cancer 2014   skin R elbow & face   Symptomatic bradycardia 06/2016    Patient Active Problem List   Diagnosis Date Noted   Acute metabolic encephalopathy 12/02/8525   Right  upper lobe pneumonia 11/21/2020   Non-ST elevated myocardial infarction ([place]) 06/27/2019   Hip fracture ([place]) 02/10/2018   Closed comminuted intertrochanteric fracture of left femur ([place]) 02/10/2018   Pacemaker 07/06/2017   Chronic respiratory failure with hypoxia ([place]) 06/09/2017   Pleural effusion associated with pulmonary infection 06/09/2017   Hyperkalemia 06/09/2017   Pleuritic chest pain 06/09/2017   Fever 05/14/2017   Renal insufficiency    Constipation 04/25/2017   Urine finding 04/25/2017   Posterior rhinorrhea 04/25/2017   Acute upper respiratory infection 04/25/2017   Thrombocytopenia ([place]) 01/15/2017   CAP (community acquired pneumonia) 01/15/2017   Symptomatic bradycardia 06/30/2016   Hypertensive heart disease 04/01/2016   Bradycardia 04/01/2016   Fall 04/01/2016   Sinus bradycardia    CKD (chronic kidney disease), stage III    Normochromic normocytic anemia    Essential hypertension    PAF (paroxysmal atrial fibrillation) (HCC)    Palpitations 09/18/2014   Shortness of breath 03/19/2014   Chronic diastolic CHF (congestive heart failure), NYHA class 2 ([place]) 03/19/2014   Fatigue 09/11/2013   Gastroenteritis 07/20/2013   Abdominal pain 07/20/2013   Abnormal x-ray of abdomen 07/20/2013   Diarrhea 07/20/2013   Generalized  weakness 07/20/2013   Obesity (BMI 30-39.9) 07/20/2013   Hypercholesteremia    GERD (gastroesophageal reflux disease)    Depression    Asthma    Gout    Acute blood loss anemia 04/05/2013   Osteoarthritis of left knee 04/04/2013   Total knee replacement status 04/04/2013   Pulmonary hypertension ([place]) 03/07/2013   Anemia, unspecified 03/19/2012   Unspecified deficiency anemia 03/19/2012   Dizziness 07/09/2010   Chest pain 07/30/2009   HYPERTENSION, UNSPECIFIED 08/19/2008   PAROXYSMAL ATRIAL FIBRILLATION 08/19/2008   GASTROESOPHAGEAL REFLUX DISEASE, HX OF 08/19/2008    Past Surgical History:  Procedure Laterality Date   ABDOMINAL  HYSTERECTOMY     partial   APPENDECTOMY  ~55years ago   CHOLECYSTECTOMY  10/2005   Dr. Effie Shy   COLONOSCOPY W/ BIOPSIES  2005, 2011   Dr. Bing Plume, "balloon inside for last one at [facility]"   INTRAMEDULLARY (IM) NAIL INTERTROCHANTERIC Left 02/10/2018   Procedure: INTRAMEDULLARY (IM) NAIL INTERTROCHANTRIC;  Surgeon: Rod Can, MD;  Location: [facility];  Service: Orthopedics;  Laterality: Left;   IR THORACENTESIS ASP PLEURAL SPACE W/IMG GUIDE  06/13/2017   KNEE ARTHROSCOPY Right    LAMINECTOMY  05/2007   foraminotomy, L4-5 laminectomy   LARYNGOSCOPY / BRONCHOSCOPY / ESOPHAGOSCOPY  2010   Dr. Benjamine Mola   PACEMAKER IMPLANT N/A 07/01/2016   Procedure: Pacemaker Implant;  Surgeon: Evans Lance, MD;  Location: [facility];  Service: Cardiovascular;  Laterality: N/A;   SKIN CANCER EXCISION Right ~2005   r elbow   TOTAL KNEE ARTHROPLASTY Left 04/04/2013   Procedure: LEFT TOTAL KNEE ARTHROPLASTY;  Surgeon: Tobi Bastos, MD;  Location: [facility];  Service: Orthopedics;  Laterality: Left;   TUBAL LIGATION     TUMOR REMOVAL Right ~ 20 years ago   tumor in between toes     OB History   No obstetric history on file.     Family History  Problem Relation Age of Onset   Heart disease Mother    Hypertension Mother    Fainting Mother    Arrhythmia Mother    Diabetes Father    Stroke Father    Hypertension Father    Cancer Brother    Sudden death Brother    Heart attack Brother    Cancer Sister    Diabetes Sister    Asthma Sister     Social History   Tobacco Use   Smoking status: Former    Packs/day: 1.00    Years: 10.00    Pack years: 10.00    Types: Cigarettes    Quit date: 02/08/1973    Years since quitting: 47.9   Smokeless tobacco: Never  Vaping Use   Vaping Use: Never used  Substance Use Topics   Alcohol use: No   Drug use: No    Home Medications Prior to Admission medications   Medication Sig Start Date End Date Taking? Authorizing Provider   acetaminophen (TYLENOL) 500 MG tablet Take 2 tablets (1,000 mg total) by mouth every 8 (eight) hours as needed for mild pain or headache. 05/16/17  Yes Hongalgi, Lenis Dickinson, MD  albuterol (2.5 MG/3ML) 0.083% NEBU 3 mL, albuterol (5 MG/ML) 0.5% NEBU 0.5 mL Inhale 5 mg into the lungs every 6 (six) hours as needed (shortness of breath/wheezing).   Yes [provider]  albuterol (PROVENTIL HFA;VENTOLIN HFA) 108 (90 BASE) MCG/ACT inhaler Inhale 1 puff into the lungs every 6 (six) hours as needed for wheezing or shortness of breath.  11/13/14  Yes Dorothy Spark, MD  allopurinol (ZYLOPRIM) 100 MG tablet Take 1 tablet (100 mg total) by mouth daily. 04/01/16  Yes Haney, Alyssa A, MD  amLODipine (NORVASC) 2.5 MG tablet Take 1 tablet (2.5 mg total) by mouth daily. Please make overdue appt with Dr. Meda Coffee before anymore refills. 1st attempt 08/09/18  Yes Dorothy Spark, MD  aspirin EC 81 MG tablet Take 81 mg by mouth daily.    Yes [provider]  cholecalciferol (VITAMIN D3) 25 MCG (1000 UNIT) tablet Take 1,000 Units by mouth daily.   Yes [provider]  famotidine (PEPCID) 20 MG tablet Take 20 mg by mouth 2 (two) times daily.   Yes [provider]  ferrous sulfate 325 (65 FE) MG tablet Take 325 mg by mouth daily.    Yes [provider]  fluticasone furoate-vilanterol (BREO ELLIPTA) 100-25 MCG/INH AEPB Inhale 1 puff into the lungs daily. 07/31/19  Yes Martyn Ehrich, NP  furosemide (LASIX) 20 MG tablet Take 1 tablet (20 mg total) by mouth 2 (two) times daily. Patient taking differently: Take 20 mg by mouth daily as needed for fluid. 08/21/19  Yes Dorothy Spark, MD  ibuprofen (ADVIL) 400 MG tablet Take 400 mg by mouth every 6 (six) hours as needed for mild pain or moderate pain.   Yes [provider]  montelukast (SINGULAIR) 10 MG tablet TAKE ONE TABLET BY MOUTH EVERYDAY AT BEDTIME Patient taking differently: Take 10 mg by mouth at bedtime. 11/25/20   Yes Martyn Ehrich, NP  ondansetron (ZOFRAN-ODT) 8 MG disintegrating tablet Take 8 mg by mouth every 8 (eight) hours as needed for nausea or vomiting.  03/26/19  Yes [provider]  sertraline (ZOLOFT) 50 MG tablet Take 50 mg by mouth daily.  03/15/12  Yes [provider]  traMADol (ULTRAM) 50 MG tablet Take 50 mg by mouth daily as needed for pain. 07/08/20  Yes [provider]  alendronate (FOSAMAX) 70 MG tablet Take 70 mg by mouth every Friday. 02/13/14   [provider]  methocarbamol (ROBAXIN) 500 MG tablet Take 500 mg by mouth 2 (two) times daily as needed for muscle spasms. Patient not taking: Reported on 01/05/2021    [provider]    Allergies    Amoxicillin, Diltiazem hcl, Losartan potassium, Potassium-containing compounds, Sulfonamide derivatives, Zetia [ezetimibe], and Penicillins  Review of Systems   Review of Systems  All other systems reviewed and are negative.  Physical Exam Updated Vital Signs BP (!) 141/68   Pulse (!) 59   Temp 97.7 F (36.5 C) (Oral)   Resp 17   Ht 1.6 m (_0 )   Wt 72.6 kg   SpO2 92%   BMI 28.34 kg/m   Physical Exam Vitals and nursing note reviewed.  Constitutional:      General: She is not in acute distress.    Appearance: She is well-developed.  HENT:     Head: Normocephalic and atraumatic.     Right Ear: External ear normal.     Left Ear: External ear normal.  Eyes:     General: No scleral icterus.       Right eye: No discharge.        Left eye: No discharge.     Conjunctiva/sclera: Conjunctivae normal.  Neck:     Trachea: No tracheal deviation.  Cardiovascular:     Rate and Rhythm: Normal rate and regular rhythm.  Pulmonary:     Effort: Pulmonary effort  is normal. No respiratory distress.     Breath sounds: Normal breath sounds. No stridor. No wheezing or rales.  Abdominal:     General: Bowel sounds are normal. There is no distension.     Palpations: Abdomen is soft.      Tenderness: There is no abdominal tenderness. There is no guarding or rebound.  Musculoskeletal:        General: Tenderness present. No deformity.     Left shoulder: Tenderness and bony tenderness present. Decreased range of motion.     Cervical back: Neck supple. No swelling or deformity.     Thoracic back: No swelling or deformity.     Lumbar back: No swelling or deformity.     Comments: C-collar in place  Skin:    General: Skin is warm and dry.     Findings: No rash.  Neurological:     General: No focal deficit present.     Mental Status: She is alert.     Cranial Nerves: No cranial nerve deficit (no facial droop, extraocular movements intact, no slurred speech).     Sensory: No sensory deficit.     Motor: No abnormal muscle tone or seizure activity.     Coordination: Coordination normal.  Psychiatric:        Mood and Affect: Mood normal.    ED Results / Procedures / Treatments   Labs (all labs ordered are listed, but only abnormal results are displayed) Labs Reviewed  BASIC METABOLIC PANEL  CBC WITH DIFFERENTIAL/PLATELET    EKG EKG Interpretation  Date/Time:  Monday January 05 2021 13:46:11 EST Ventricular Rate:  60 PR Interval:  185 QRS Duration: 106 QT Interval:  457 QTC Calculation: 457 R Axis:   -52 Text Interpretation: Sinus rhythm Left anterior fascicular block Abnormal R-wave progression, late transition Since last tracing rate faster Confirmed by Dorie Rank [phone]) on 01/05/2021 3:04:15 PM  Radiology DG Thoracic Spine 2 View  Result Date: 01/05/2021 CLINICAL DATA:  Pain post fall EXAM: THORACIC SPINE 2 VIEWS COMPARISON:  Lumbar spine radiographs 02/09/2018 FINDINGS: Osseous demineralization. Mild superior endplate height loss of T12 anteriorly, grossly unchanged. Remaining vertebral body heights maintained. No additional fracture, subluxation, or bone destruction. Atherosclerotic calcification aorta. IMPRESSION: Osseous demineralization with minimal chronic  superior endplate height loss of T12 vertebral body. No acute abnormalities. Aortic Atherosclerosis (ICD10-I70.0). Electronically Signed   By: Lavonia Dana M.D.   On: 01/05/2021 15:32   DG Lumbar Spine 2-3 Views  Result Date: 01/05/2021 CLINICAL DATA:  Pain post fall EXAM: LUMBAR SPINE - 2-3 VIEW COMPARISON:  None FINDINGS: 5 non-rib-bearing lumbar vertebra. Lumbar vertebral body and disc space heights maintained. Mild superior endplate height loss of T12 vertebral body consistent with subtle superior endplate fracture. Facet degenerative changes throughout lumbar region. No definite fracture, subluxation, or bone destruction. Extensive atherosclerotic calcifications aorta and visceral arteries. IMPRESSION: No acute osseous abnormalities. Osseous demineralization with minimal chronic superior endplate height loss of T12. Electronically Signed   By: Lavonia Dana M.D.   On: 01/05/2021 15:32   DG Shoulder Left  Result Date: 01/05/2021 CLINICAL DATA:  Fall. EXAM: LEFT SHOULDER - 2+ VIEW COMPARISON:  None. FINDINGS: There is an acute transverse fracture through the left humeral neck and lateral aspect of the head. The fracture is comminuted and impacted. There is no dislocation. There are mild degenerative changes of the acromioclavicular joint. There is soft tissue swelling overlying the shoulder. IMPRESSION: 1. Comminuted fracture of the left humeral head and neck. Electronically Signed  By: Ronney Asters M.D.   On: 01/05/2021 15:26    Procedures Procedures   Medications Ordered in ED Medications  morphine 4 MG/ML injection 4 mg (4 mg Intravenous Given 01/05/21 1355)    ED Course  I have reviewed the triage vital signs and the nursing notes.  Pertinent labs & imaging results that were available during my care of the patient were reviewed by me and considered in my medical decision making (see chart for details).  Clinical Course as of 01/05/21 1536  Mon Jan 05, 2021  1532 Imaging tests are  still waiting for formal radiology read.  Preliminary review of shoulder x-ray does suggest proximal humerus fracture [JK]    Clinical Course User Index [JK] Dorie Rank, MD   MDM Rules/Calculators/A&P                           Pt presents after a mechanical fall.  Primarily complaining of shoulder pain.  No lower extremity pain.  Xray does suggest proximal humerus fx.   Will order a sling.  Other labs and imaging pending.  Case turned over to Dr Dina Rich pending labs and imaging results.  Final Clinical Impression(s) / ED Diagnoses Final diagnoses:  Closed fracture of proximal end of left humerus, unspecified fracture morphology, initial encounter    Rx / DC Orders ED Discharge Orders     None        Dorie Rank, MD 01/05/21 1536

## 2021-01-05 NOTE — ED Provider Notes (Addendum)
Patient signed out to me by previous provider. Please refer to their note for full HPI.  Briefly this is a 87 year old female with mechanical fall, pain on the left side of the body.  We are pending x-ray imaging.  Metabolic work-up otherwise is around baseline.  Physical Exam  BP 140/61   Pulse 60   Temp 97.7 F (36.5 C) (Oral)   Resp 13   Ht 5\' 3"  (1.6 m)   Wt 72.6 kg   SpO2 97%   BMI 28.34 kg/m   Physical Exam Vitals and nursing note reviewed.  Constitutional:      Appearance: Normal appearance.  HENT:     Head: Normocephalic.     Mouth/Throat:     Mouth: Mucous membranes are moist.  Cardiovascular:     Rate and Rhythm: Normal rate.  Pulmonary:     Effort: Pulmonary effort is normal. No respiratory distress.  Abdominal:     Palpations: Abdomen is soft.     Tenderness: There is no abdominal tenderness.  Musculoskeletal:     Comments: Left shoulder swelling and tenderness, left upper extremity is neurovascularly intact  Skin:    General: Skin is warm.  Neurological:     Mental Status: She is alert and oriented to person, place, and time. Mental status is at baseline.  Psychiatric:        Mood and Affect: Mood normal.    ED Course/Procedures   Clinical Course as of 01/05/21 1840  Mon Jan 05, 2021  1532 Imaging tests are still waiting for formal radiology read.  Preliminary review of shoulder x-ray does suggest proximal humerus fracture [JK]    Clinical Course User Index [JK] Dorie Rank, MD    Procedures  MDM   X-ray imaging is negative except for a left comminuted humeral head/neck fracture.  Spoke with Hilbert Odor, on-call orthopedic PA, recommends sling immobilization, pain control and outpatient follow-up in the office.  Daughter [name] will be updated.  Original plan was for sling and outpatient Ortho follow-up however patient is walker dependent.  She will not be able to ambulate at home and is essentially immobile.  Daughter states that she does not have  24/7 care for her available at home.  Transition of care and social work consulted for possible rehab/SNF placement or home health assistance.  Patient signed out to provider Default pending social work/transition of care recommendations.  Patient stable, eating and drinking.       Lorelle Gibbs, DO 01/05/21 1841    Lorelle Gibbs, DO 01/05/21 2053

## 2021-01-05 NOTE — ED Notes (Signed)
Pt placed on 2L [place] due to O2 dropping in the low 90s.

## 2021-01-05 NOTE — ED Triage Notes (Addendum)
Pt presents via EMS/GC with chief c/o of a unwitnessed fall at home with no LOC. A&O x 4. Presents with left shoulder and hip pain. + pedal pulses present bilaterally. C-collar present at arrival. 10/10 on the on the verbal pain scale.Pt states not currently on any blood thinners.    45mcg fentanyl, 4mg  Zofran given via GC.  Vitals: BP 140/80

## 2021-01-05 NOTE — ED Notes (Signed)
Daughter would like to be called when results are reviewed. She had to leave. Number in chart.

## 2021-01-05 NOTE — NC FL2 (Signed)
[place] MEDICAID FL2 LEVEL OF CARE SCREENING TOOL     IDENTIFICATION  Patient Name: [name] Birthdate: 08-02-1927 Sex: female Admission Date (Current Location): 01/05/2021  [facility] and [place] Number:  Herbalist and Address:  [facility] [place], [place] [address] [place], [place] [address]      Provider Number: [id number]  Attending Physician Name and Address:  Default, Provider, MD  Relative Name and Phone Number:  Carollee Herter Daughter [phone]    Current Level of Care: Hospital Recommended Level of Care: [place] Prior Approval Number:    Date Approved/Denied:   PASRR Number: [id number]  Discharge Plan: SNF    Current Diagnoses: Patient Active Problem List   Diagnosis Date Noted   Acute metabolic encephalopathy 33/29/5188   Right upper lobe pneumonia 11/21/2020   Non-ST elevated myocardial infarction ([place]) 06/27/2019   Hip fracture ([place]) 02/10/2018   Closed comminuted intertrochanteric fracture of left femur ([place]) 02/10/2018   Pacemaker 07/06/2017   Chronic respiratory failure with hypoxia ([place]) 06/09/2017   Pleural effusion associated with pulmonary infection 06/09/2017   Hyperkalemia 06/09/2017   Pleuritic chest pain 06/09/2017   Fever 05/14/2017   Renal insufficiency    Constipation 04/25/2017   Urine finding 04/25/2017   Posterior rhinorrhea 04/25/2017   Acute upper respiratory infection 04/25/2017   Thrombocytopenia ([place]) 01/15/2017   CAP (community acquired pneumonia) 01/15/2017   Symptomatic bradycardia 06/30/2016   Hypertensive heart disease 04/01/2016   Bradycardia 04/01/2016   Fall 04/01/2016   Sinus bradycardia    CKD (chronic kidney disease), stage III    Normochromic normocytic anemia    Essential hypertension    PAF (paroxysmal atrial fibrillation) (HCC)    Palpitations 09/18/2014   Shortness of breath 03/19/2014   Chronic diastolic CHF (congestive heart failure), NYHA class 2  ([place]) 03/19/2014   Fatigue 09/11/2013   Gastroenteritis 07/20/2013   Abdominal pain 07/20/2013   Abnormal x-ray of abdomen 07/20/2013   Diarrhea 07/20/2013   Generalized weakness 07/20/2013   Obesity (BMI 30-39.9) 07/20/2013   Hypercholesteremia    GERD (gastroesophageal reflux disease)    Depression    Asthma    Gout    Acute blood loss anemia 04/05/2013   Osteoarthritis of left knee 04/04/2013   Total knee replacement status 04/04/2013   Pulmonary hypertension ([place]) 03/07/2013   Anemia, unspecified 03/19/2012   Unspecified deficiency anemia 03/19/2012   Dizziness 07/09/2010   Chest pain 07/30/2009   HYPERTENSION, UNSPECIFIED 08/19/2008   PAROXYSMAL ATRIAL FIBRILLATION 08/19/2008   GASTROESOPHAGEAL REFLUX DISEASE, HX OF 08/19/2008    Orientation RESPIRATION BLADDER Height & Weight     Self, Situation, Place  O2 (2 L [place] continuous) Continent Weight: 160 lb (72.6 kg) Height:  5\' 3"  (160 cm)  BEHAVIORAL SYMPTOMS/MOOD NEUROLOGICAL BOWEL NUTRITION STATUS      Continent Diet (low sodium heart healthy)  AMBULATORY STATUS COMMUNICATION OF NEEDS Skin   Extensive Assist Verbally Normal                       Personal Care Assistance Level of Assistance  Bathing, Feeding, Dressing Bathing Assistance: Limited assistance Feeding assistance: Limited assistance Dressing Assistance: Limited assistance     Functional Limitations Info  Sight, Hearing, Speech Sight Info: Adequate Hearing Info: Adequate Speech Info: Adequate    SPECIAL CARE FACTORS FREQUENCY  PT (By licensed PT), OT (By licensed OT)     PT Frequency: 5x weekly OT Frequency: 5 x weekly  Contractures Contractures Info: Not present    Additional Factors Info  Code Status, Allergies Code Status Info: DNR Allergies Info: (7) Diltiazem, Diltiazem Hcl, Losartan Potassium, Potassium containing compounds, sulfonamide derivatives, Penicillians, amoxicillin, zetia           Current Medications  (01/05/2021):  This is the current hospital active medication list No current facility-administered medications for this encounter.   Current Outpatient Medications  Medication Sig Dispense Refill   acetaminophen (TYLENOL) 500 MG tablet Take 2 tablets (1,000 mg total) by mouth every 8 (eight) hours as needed for mild pain or headache.     albuterol (2.5 MG/3ML) 0.083% NEBU 3 mL, albuterol (5 MG/ML) 0.5% NEBU 0.5 mL Inhale 5 mg into the lungs every 6 (six) hours as needed (shortness of breath/wheezing).     albuterol (PROVENTIL HFA;VENTOLIN HFA) 108 (90 BASE) MCG/ACT inhaler Inhale 1 puff into the lungs every 6 (six) hours as needed for wheezing or shortness of breath. 1 Inhaler 3   allopurinol (ZYLOPRIM) 100 MG tablet Take 1 tablet (100 mg total) by mouth daily. 30 tablet 6   amLODipine (NORVASC) 2.5 MG tablet Take 1 tablet (2.5 mg total) by mouth daily. Please make overdue appt with Dr. Meda Coffee before anymore refills. 1st attempt 30 tablet 0   aspirin EC 81 MG tablet Take 81 mg by mouth daily.      cholecalciferol (VITAMIN D3) 25 MCG (1000 UNIT) tablet Take 1,000 Units by mouth daily.     famotidine (PEPCID) 20 MG tablet Take 20 mg by mouth 2 (two) times daily.     ferrous sulfate 325 (65 FE) MG tablet Take 325 mg by mouth daily.      fluticasone furoate-vilanterol (BREO ELLIPTA) 100-25 MCG/INH AEPB Inhale 1 puff into the lungs daily. 28 each 1   furosemide (LASIX) 20 MG tablet Take 1 tablet (20 mg total) by mouth 2 (two) times daily. (Patient taking differently: Take 20 mg by mouth daily as needed for fluid.) 60 tablet 2   ibuprofen (ADVIL) 400 MG tablet Take 400 mg by mouth every 6 (six) hours as needed for mild pain or moderate pain.     montelukast (SINGULAIR) 10 MG tablet TAKE ONE TABLET BY MOUTH EVERYDAY AT BEDTIME (Patient taking differently: Take 10 mg by mouth at bedtime.) 30 tablet 3   ondansetron (ZOFRAN-ODT) 8 MG disintegrating tablet Take 8 mg by mouth every 8 (eight) hours as needed  for nausea or vomiting.      sertraline (ZOLOFT) 50 MG tablet Take 50 mg by mouth daily.      traMADol (ULTRAM) 50 MG tablet Take 50 mg by mouth daily as needed for pain.     alendronate (FOSAMAX) 70 MG tablet Take 70 mg by mouth every Friday.     methocarbamol (ROBAXIN) 500 MG tablet Take 500 mg by mouth 2 (two) times daily as needed for muscle spasms. (Patient not taking: Reported on 01/05/2021)       Discharge Medications: Please see discharge summary for a list of discharge medications.  Relevant Imaging Results:  Relevant Lab Results:   Additional Information SSN# [id number]/ [place] vax x2.  Kreed Kauffman, LCSW

## 2021-01-05 NOTE — Progress Notes (Signed)
CSW met with Pt and daughter at bedside. CSW informed daughter that due to Pt having recently discharged from SNF  there is a likelihood that Pt will be in copay days for any further SNF needs.   After CSW exited room daughter informed floor RN that family is willing to pay copays as necessary.  CSW has completed new FL2 and sent it via hub to [facility] and other SNFs.  Pt still needs PT eval. Holli Humbles is completed by facility.  First shift CSW updated.

## 2021-01-06 ENCOUNTER — Emergency Department (HOSPITAL_COMMUNITY): Payer: Medicare HMO

## 2021-01-06 DIAGNOSIS — S32512A Fracture of superior rim of left pubis, initial encounter for closed fracture: Secondary | ICD-10-CM | POA: Diagnosis not present

## 2021-01-06 DIAGNOSIS — Z90710 Acquired absence of both cervix and uterus: Secondary | ICD-10-CM | POA: Diagnosis not present

## 2021-01-06 DIAGNOSIS — M1612 Unilateral primary osteoarthritis, left hip: Secondary | ICD-10-CM | POA: Diagnosis not present

## 2021-01-06 DIAGNOSIS — M47816 Spondylosis without myelopathy or radiculopathy, lumbar region: Secondary | ICD-10-CM | POA: Diagnosis not present

## 2021-01-06 DIAGNOSIS — M533 Sacrococcygeal disorders, not elsewhere classified: Secondary | ICD-10-CM | POA: Diagnosis not present

## 2021-01-06 LAB — RESP PANEL BY RT-PCR (FLU A&B, COVID) ARPGX2
Influenza A by PCR: NEGATIVE
Influenza A by PCR: NEGATIVE
Influenza B by PCR: NEGATIVE
Influenza B by PCR: NEGATIVE
SARS Coronavirus 2 by RT PCR: NEGATIVE
SARS Coronavirus 2 by RT PCR: NEGATIVE

## 2021-01-06 MED ORDER — SERTRALINE HCL 50 MG PO TABS
50.0000 mg | ORAL_TABLET | Freq: Every day | ORAL | Status: DC
  Administered 2021-01-07 – 2021-01-12 (×6): 50 mg via ORAL
  Filled 2021-01-06 (×6): qty 1

## 2021-01-06 MED ORDER — OXYCODONE HCL 5 MG PO TABS
ORAL_TABLET | ORAL | Status: AC
  Filled 2021-01-06: qty 1

## 2021-01-06 MED ORDER — ALBUTEROL SULFATE HFA 108 (90 BASE) MCG/ACT IN AERS: 1.0000 | INHALATION_SPRAY | Freq: Four times a day (QID) | RESPIRATORY_TRACT | Status: DC | PRN

## 2021-01-06 MED ORDER — HYDROCODONE-ACETAMINOPHEN 5-325 MG PO TABS
ORAL_TABLET | ORAL | Status: AC
  Filled 2021-01-06: qty 1

## 2021-01-06 MED ORDER — AMLODIPINE BESYLATE 5 MG PO TABS
ORAL_TABLET | ORAL | Status: AC
  Administered 2021-01-07: 2.5 mg via ORAL
  Filled 2021-01-06: qty 1

## 2021-01-06 MED ORDER — ALBUTEROL SULFATE (2.5 MG/3ML) 0.083% IN NEBU
2.5000 mg | INHALATION_SOLUTION | Freq: Four times a day (QID) | RESPIRATORY_TRACT | Status: DC | PRN
  Administered 2021-01-07 (×2): 2.5 mg via RESPIRATORY_TRACT
  Filled 2021-01-06 (×2): qty 3

## 2021-01-06 MED ORDER — FERROUS SULFATE 325 (65 FE) MG PO TABS
325.0000 mg | ORAL_TABLET | Freq: Every day | ORAL | Status: DC
  Administered 2021-01-06 – 2021-01-12 (×6): 325 mg via ORAL
  Filled 2021-01-06 (×6): qty 1

## 2021-01-06 MED ORDER — ACETAMINOPHEN 500 MG PO TABS
ORAL_TABLET | ORAL | Status: AC
  Filled 2021-01-06: qty 2

## 2021-01-06 MED ORDER — FLUTICASONE FUROATE-VILANTEROL 100-25 MCG/ACT IN AEPB
1.0000 | INHALATION_SPRAY | Freq: Every day | RESPIRATORY_TRACT | Status: DC
  Administered 2021-01-07 – 2021-01-12 (×5): 1 via RESPIRATORY_TRACT
  Filled 2021-01-06 (×2): qty 28

## 2021-01-06 MED ORDER — FAMOTIDINE 20 MG PO TABS
20.0000 mg | ORAL_TABLET | Freq: Every day | ORAL | Status: DC
  Administered 2021-01-06 – 2021-01-12 (×6): 20 mg via ORAL
  Filled 2021-01-06 (×6): qty 1

## 2021-01-06 MED ORDER — FERROUS SULFATE 325 (65 FE) MG PO TABS
ORAL_TABLET | ORAL | Status: AC
  Administered 2021-01-07: 325 mg via ORAL
  Filled 2021-01-06: qty 1

## 2021-01-06 MED ORDER — OXYCODONE HCL 5 MG PO TABS
2.5000 mg | ORAL_TABLET | Freq: Four times a day (QID) | ORAL | Status: AC | PRN
Start: 2021-01-06 — End: 2021-01-11
  Administered 2021-01-06 – 2021-01-07 (×3): 5 mg via ORAL
  Administered 2021-01-07: 2.5 mg via ORAL
  Administered 2021-01-08 – 2021-01-10 (×7): 5 mg via ORAL
  Filled 2021-01-06 (×11): qty 1

## 2021-01-06 MED ORDER — AMLODIPINE BESYLATE 5 MG PO TABS
2.5000 mg | ORAL_TABLET | Freq: Every day | ORAL | Status: DC
  Administered 2021-01-06 – 2021-01-12 (×6): 2.5 mg via ORAL
  Filled 2021-01-06 (×6): qty 1

## 2021-01-06 MED ORDER — FUROSEMIDE 20 MG PO TABS: 20.0000 mg | ORAL_TABLET | Freq: Every day | ORAL | Status: DC | PRN

## 2021-01-06 MED ORDER — FAMOTIDINE 20 MG PO TABS
ORAL_TABLET | ORAL | Status: AC
  Administered 2021-01-07: 20 mg via ORAL
  Filled 2021-01-06: qty 1

## 2021-01-06 MED ORDER — TRAMADOL HCL 50 MG PO TABS
50.0000 mg | ORAL_TABLET | Freq: Once | ORAL | Status: AC
  Administered 2021-01-12: 50 mg via ORAL
  Filled 2021-01-06 (×2): qty 1

## 2021-01-06 MED ORDER — OXYCODONE HCL 5 MG PO TABS
ORAL_TABLET | ORAL | Status: AC
  Administered 2021-01-06: 5 mg via ORAL
  Filled 2021-01-06: qty 1

## 2021-01-06 MED ORDER — HYDROCODONE-ACETAMINOPHEN 5-325 MG PO TABS
1.0000 | ORAL_TABLET | Freq: Once | ORAL | Status: AC
  Administered 2021-01-06: 1 via ORAL

## 2021-01-06 MED ORDER — MONTELUKAST SODIUM 10 MG PO TABS
10.0000 mg | ORAL_TABLET | Freq: Every day | ORAL | Status: DC
  Administered 2021-01-06 – 2021-01-12 (×7): 10 mg via ORAL
  Filled 2021-01-06 (×6): qty 1

## 2021-01-06 MED ORDER — ACETAMINOPHEN 500 MG PO TABS
1000.0000 mg | ORAL_TABLET | Freq: Three times a day (TID) | ORAL | Status: DC | PRN
  Administered 2021-01-06 – 2021-01-12 (×8): 1000 mg via ORAL
  Filled 2021-01-06 (×7): qty 2

## 2021-01-06 NOTE — Progress Notes (Signed)
CSW received a call from [place] at [facility] who stated she will see if she has space for patient and let CSW know.

## 2021-01-06 NOTE — ED Provider Notes (Addendum)
Patient boarding in ED, waiting SNF placement.   Pt alert, conversant. Is eating breakfast. Pt c/o pain to left shoulder, sling in place. Mild swelling to upper arm noted, compartments of arm/upper arm are soft, not tense. Radial pulse palp.   Ultram po.  TOC/SW placement of patient pending, hopefully will find SNF today.  On additional check of patient, patient with pain left hip, increased w rom. No prior imaging done. Plain films ordered - radiology read is not acute process. ?irregularity to left pubic rami - will get ct to r/o occult fx, pubic rami or hip.  Signed out to [place] to check CT when resulted.       Lajean Saver, MD 01/06/21 1434

## 2021-01-06 NOTE — ED Notes (Signed)
The patient frequently calls out stating she wants to go home. This EMT explains the delay of further imaging and the need for placement. The patient appears satisfied with the responses given

## 2021-01-06 NOTE — TOC CAGE-AID Note (Signed)
Transition of Care [facility]) - CAGE-AID Screening   Patient Details  Name: [name] MRN: [id number] Date of Birth: 05-Sep-1927  Transition of Care [facility]) CM/SW Contact:    Damiah Mcdonald C Tarpley-Carter, [place] Phone Number: 01/06/2021, 10:01 AM   Clinical Narrative: Pt is unable to participate in Cage Aid.  Pt is not appropriate for assessment.  Crystal Scarberry Tarpley-Carter, MSW, LCSW-A Pronouns:  She/Her/Hers [facility] HealthTransitions of Care Clinical Social Worker Direct Number:  [phone] [email or web address]   CAGE-AID Screening: Substance Abuse Screening unable to be completed due to: : Patient unable to participate (Pt is not appropriate for assessment.)             Substance Abuse Education Offered: No

## 2021-01-06 NOTE — ED Notes (Signed)
TC from

## 2021-01-06 NOTE — ED Notes (Signed)
Pt reports feeling uncomfortable, but pain is under control for now. Pt asked if she wanted to transfer to a hospital bed or change positions and pt declined.

## 2021-01-06 NOTE — ED Notes (Addendum)
Pt agreeable to CT scan post conversation with daughter at bedside. CT completed and pt transitioned to hospital bed post scan. Pt reported significant pain to left shoulder when moving, moderate at rest. Dark purple bruising now extends from armpit to elbow on left extremity. Area soft. Tenderness/discomfort noted in left hip during moving as well.

## 2021-01-06 NOTE — ED Notes (Signed)
The patient asked if she could get a pack for her left shoulder. This EMT provided a hot pack for the patient and placed it on the affected area.

## 2021-01-06 NOTE — Evaluation (Signed)
Physical Therapy Treatment Patient Details Name: [name] MRN: [id number] DOB: 02-Feb-1928 Today's Date: 01/06/2021   History of Present Illness Pt presented to ED 11/28 after fall at home and suffering lt humeral neck fx. PMH - arthritis, chf, ckd, dvt, HTN, gout, PAF, TKR, hip fx, pacer    PT Comments    Pt presents to PT after another fall with lt shoulder fx and is also c/o lt hip pain. Pt unable to tolerate any mobility this AM due to pain. Able to perform active movement of RUE and RLE and some active assisted movement of LLE within limits of pain. Expect pt will make slow progress with mobility as pain eases. Will not be able to use a walker due to LUE fx. Recommend SNF for further rehab.    Recommendations for follow up therapy are one component of a multi-disciplinary discharge planning process, led by the attending physician.  Recommendations may be updated based on patient status, additional functional criteria and insurance authorization.  Follow Up Recommendations  Skilled nursing-short term rehab (<3 hours/day)     Assistance Recommended at Discharge Frequent or constant Supervision/Assistance  Equipment Recommendations  Wheelchair (measurements PT)    Recommendations for Other Services       Precautions / Restrictions Precautions Precautions: Fall;Other (comment) Required Braces or Orthoses: Sling (LUE)     Mobility  Bed Mobility               General bed mobility comments: Attempted supine to sit but pt unable to tolerate due to pain in lt hip    Transfers                   General transfer comment: Unable to attempt due to pain    Ambulation/Gait                   Stairs             Wheelchair Mobility    Modified Rankin (Stroke Patients Only)       Balance Overall balance assessment: History of Falls                                          Cognition Arousal/Alertness:  Awake/alert Behavior During Therapy: WFL for tasks assessed/performed Overall Cognitive Status: No family/caregiver present to determine baseline cognitive functioning                                 General Comments: follows 1 step commands, slow to process        Exercises      General Comments        Pertinent Vitals/Pain Pain Assessment: Faces Faces Pain Scale: Hurts whole lot Pain Location: lt hip and lt shoulder Pain Descriptors / Indicators: Grimacing;Guarding;Moaning Pain Intervention(s): Limited activity within patient's tolerance;Monitored during session    Home Living Family/patient expects to be discharged to:: Skilled nursing facility                        Prior Function            PT Goals (current goals can now be found in the care plan section) Acute Rehab PT Goals Patient Stated Goal: not stated PT Goal Formulation: With patient Time For Goal Achievement: 01/20/21 Potential to  Achieve Goals: Fair    Frequency    Min 2X/week      PT Plan      Co-evaluation              AM-PAC PT "6 Clicks" Mobility   Outcome Measure  Help needed turning from your back to your side while in a flat bed without using bedrails?: Total Help needed moving from lying on your back to sitting on the side of a flat bed without using bedrails?: Total Help needed moving to and from a bed to a chair (including a wheelchair)?: Total Help needed standing up from a chair using your arms (e.g., wheelchair or bedside chair)?: Total Help needed to walk in hospital room?: Total Help needed climbing 3-5 steps with a railing? : Total 6 Click Score: 6    End of Session   Activity Tolerance: Patient limited by pain Patient left: in bed   PT Visit Diagnosis: Other abnormalities of gait and mobility (R26.89);Repeated falls (R29.6);Pain Pain - Right/Left: Left Pain - part of body: Shoulder;Hip     Time: 7026-3785 PT Time Calculation (min)  (ACUTE ONLY): 9 min  Charges:                        [place] Pager [phone] Office [place] 01/06/2021, 9:41 AM

## 2021-01-06 NOTE — ED Notes (Signed)
Pt reporting pain 9/10. MD Bero notified, medication order obtained. Increase in bruising noted on left bicep, area still soft to touch.

## 2021-01-06 NOTE — ED Notes (Signed)
Message sent to EDP to report Pt refused CT scan.

## 2021-01-06 NOTE — Progress Notes (Signed)
Patient accepted at [facility] and insurance authorization has been started. CSW was told if approved patient could have a bed tomorrow or Thursday.

## 2021-01-06 NOTE — Progress Notes (Signed)
PT notes were faxed to the facilities who will accept patients insurance.

## 2021-01-06 NOTE — ED Notes (Signed)
The patient reported to this EMT that she was experiencing pain in her left shoulder and hip. Audrey-RN was made aware of the situation

## 2021-01-06 NOTE — ED Notes (Signed)
TC from Granddaughter to report she had called [facility] that Pt needs a rehab facility.

## 2021-01-06 NOTE — ED Notes (Signed)
The patient was transported to CT imaging, but upon arrival to the department, she refused the imaging. Audrey-RN was made aware of the incident

## 2021-01-07 NOTE — ED Notes (Signed)
Sitting up eating, using R arm, L arm in sling.

## 2021-01-07 NOTE — Progress Notes (Signed)
CSW unable to leave a message for patients daughter due to her mailbox being full.

## 2021-01-07 NOTE — ED Notes (Signed)
Has been sleeping. VS checked/ VSS. Poor limited effort with IS. Denies pain.

## 2021-01-07 NOTE — ED Notes (Addendum)
Pt moaning, reporting severe pain in left arm. Pt presenting with mild confusion, asking RN "Where is your mama?" Pt reoriented to present. Daughter (NP) reported hospital induced delirium during last hospital stay when at pt bedside earlier in the night. Will continue to monitor pt pain and mental status. RN to administer PRN pain medication.

## 2021-01-07 NOTE — ED Notes (Signed)
Breakfast order placed ?

## 2021-01-07 NOTE — Progress Notes (Addendum)
CSW spoke with Claiborne Billings with admissions at [facility]. CSW was told that she still hasn't heard back from [employer] to determine if they will approve patient to discharge to their facility. CSW was also told if insurance approves patient this week that they would not be able to take patient until Monday due to them being short staffed.

## 2021-01-07 NOTE — ED Notes (Signed)
D/w EDP IS and DVT prophylaxis. PT/OT paged, message left.

## 2021-01-07 NOTE — ED Notes (Signed)
Increased moaning, tylenol given. LS diminished, neb given.

## 2021-01-07 NOTE — ED Notes (Signed)
[place] MD notified of CT findings

## 2021-01-08 ENCOUNTER — Other Ambulatory Visit (HOSPITAL_COMMUNITY): Payer: Self-pay

## 2021-01-08 DIAGNOSIS — S42202A Unspecified fracture of upper end of left humerus, initial encounter for closed fracture: Secondary | ICD-10-CM | POA: Diagnosis not present

## 2021-01-08 DIAGNOSIS — S3282XA Multiple fractures of pelvis without disruption of pelvic ring, initial encounter for closed fracture: Secondary | ICD-10-CM | POA: Diagnosis not present

## 2021-01-08 MED ORDER — RIVAROXABAN 10 MG PO TABS: 10.0000 mg | ORAL_TABLET | Freq: Every day | ORAL | 0 refills | Status: DC

## 2021-01-08 MED ORDER — RIVAROXABAN 10 MG PO TABS
10.0000 mg | ORAL_TABLET | Freq: Every day | ORAL | Status: DC
  Administered 2021-01-08 – 2021-01-12 (×5): 10 mg via ORAL
  Filled 2021-01-08 (×7): qty 1

## 2021-01-08 NOTE — ED Notes (Signed)
Family at bedside. 

## 2021-01-08 NOTE — Progress Notes (Addendum)
Physical Therapy Treatment Patient Details Name: [name] MRN: [id number] DOB: 1927/11/08 Today's Date: 01/08/2021   History of Present Illness Pt presented to ED 11/28 after fall at home and suffering lt humeral neck fx. CT on 11/29 showed acute nondisplaced lt superior and inferior pubic rami fx's. PMH - arthritis, chf, ckd, dvt, HTN, gout, PAF, TKR, hip fx, pacer    PT Comments    Pt seen for continued work toward increasing mobility. Pt found to have lt inferior and posterior pelvic rami fracture since last seen by PT on 11/29 which explains pt's c/o pain in lt hip and groin. Pt making slow progress with mobility and was able to tolerated sitting EOB x 10 minutes. Expect pt will make slow, steady progress as pain gradually decreases in her non surgical fx's. Continue to recommend SNF for further rehab.    Recommendations for follow up therapy are one component of a multi-disciplinary discharge planning process, led by the attending physician.  Recommendations may be updated based on patient status, additional functional criteria and insurance authorization.  Follow Up Recommendations  Skilled nursing-short term rehab (<3 hours/day)     Assistance Recommended at Discharge Frequent or constant Supervision/Assistance  Equipment Recommendations  Wheelchair (measurements PT);Other (comment);Hospital bed (hoyer lift)    Recommendations for Other Services       Precautions / Restrictions Precautions Precautions: Fall;Other (comment) Required Braces or Orthoses: Sling (LUE) Restrictions Other Position/Activity Restrictions: Pt now with documented lt pubic rami fx's. Assume WBAT.     Mobility  Bed Mobility Overal bed mobility: Needs Assistance Bed Mobility: Supine to Sit;Sit to Supine     Supine to sit: +2 for physical assistance;Total assist Sit to supine: +2 for physical assistance;Total assist   General bed mobility comments: Assist bringing legs off bed. elevating  trunk into sitting, and bring hips to EOB. Assist lowering trunk and bringing legs back into bed returning to supine.    Transfers                   General transfer comment: Did not attempt    Ambulation/Gait                   Stairs             Wheelchair Mobility    Modified Rankin (Stroke Patients Only)       Balance Overall balance assessment: Needs assistance;History of Falls Sitting-balance support: Single extremity supported;Feet supported Sitting balance-Leahy Scale: Poor Sitting balance - Comments: Pt sat EOB with mod assist for balance. Pt with tendency to push posteriorly Postural control: Posterior lean                                  Cognition Arousal/Alertness: Awake/alert Behavior During Therapy: WFL for tasks assessed/performed Overall Cognitive Status: Impaired/Different from baseline Area of Impairment: Attention;Memory;Following commands;Problem solving                   Current Attention Level: Sustained Memory: Decreased short-term memory Following Commands: Follows one step commands with increased time;Follows one step commands inconsistently     Problem Solving: Slow processing;Requires verbal cues;Decreased initiation;Requires tactile cues General Comments: Pt distracted by pain        Exercises      General Comments        Pertinent Vitals/Pain Pain Assessment: Faces Faces Pain Scale: Hurts whole lot Breathing: normal Negative Vocalization: occasional  moan/groan, low speech, negative/disapproving quality Facial Expression: facial grimacing Body Language: tense, distressed pacing, fidgeting Consolability: no need to console PAINAD Score: 4 Pain Location: lt hip and lt shoulder and back Pain Descriptors / Indicators: Crying;Grimacing;Moaning Pain Intervention(s): Monitored during session;Repositioned;Premedicated before session    Home Living                          Prior  Function            PT Goals (current goals can now be found in the care plan section) Acute Rehab PT Goals Patient Stated Goal: daughter wants pt to go to rehab Progress towards PT goals: Progressing toward goals    Frequency    Min 2X/week      PT Plan Current plan remains appropriate    Co-evaluation              AM-PAC PT "6 Clicks" Mobility   Outcome Measure  Help needed turning from your back to your side while in a flat bed without using bedrails?: Total Help needed moving from lying on your back to sitting on the side of a flat bed without using bedrails?: Total Help needed moving to and from a bed to a chair (including a wheelchair)?: Total Help needed standing up from a chair using your arms (e.g., wheelchair or bedside chair)?: Total Help needed to walk in hospital room?: Total Help needed climbing 3-5 steps with a railing? : Total 6 Click Score: 6    End of Session   Activity Tolerance: Patient limited by fatigue;Patient limited by pain Patient left: in bed;with family/visitor present Nurse Communication: Mobility status PT Visit Diagnosis: Other abnormalities of gait and mobility (R26.89);Repeated falls (R29.6);Pain Pain - Right/Left: Left Pain - part of body: Shoulder;Hip     Time: 6203-5597 PT Time Calculation (min) (ACUTE ONLY): 22 min  Charges:  $Therapeutic Activity: 8-22 mins                     [place] Pager [phone] Office [place] 01/08/2021, 4:40 PM

## 2021-01-08 NOTE — Progress Notes (Signed)
CSW attempted to contact the daughter again but was not able to leave a VM. Authorization has to be resubmitted by [facility] on Friday 01/09/21. [employer] denied SNF due to pain acuity. CSW will have provider consult PT for another evaluation.

## 2021-01-08 NOTE — ED Notes (Signed)
PT at bedside.

## 2021-01-08 NOTE — ED Notes (Signed)
Pt presenting as more confused than previous nights. Pt slept on and off throughout the night. Pain level monitored using 1-10 pain scale and PAINAD due to altered mental status.

## 2021-01-08 NOTE — Progress Notes (Signed)
CSW was able to speak with patients daughter at beside. CSW asked the daughter if she got CSW's calls this morning and yesterday and she shook her head no. CSW stated she was not able to leave an voicemail because its full. Patients daughter looked at [place] and stated "I know". CSW explained patients insurance denying her for SNF at this time. Patients daughter got upset and stated why can't patient get rehab here. CSW stated that her mother did not meet admissions criteria and inpatient rehab is not offered to ED patients. Patients daughter stated "All of this is ridiculous". CSW stated that Tarri Glenn is going to attempt another authorization tomorrow. CSW told patients daughter if its denied again she would have to go through the appeals process with the insurance company and patient might have to be discharged home with home health. Patients daughter got upset and stated she is a Designer, jewellery and that she is not in good shape to go home. CSW also mentioned private pay and comfort keepers. Patients daughter stated "Do you know how much that costs?" CSW told patients daughter she was giving her the options and trying to work ahead if patient is denied again. Patients daughter asked for [employer]'s number and stated "I will sue them"

## 2021-01-08 NOTE — TOC Benefit Eligibility Note (Signed)
Patient Teacher, English as a foreign language completed.    The patient is currently admitted and upon discharge could be taking Xarelto 10 mg.  The current 30 day co-pay is, $47.00.   The patient is insured through [place], [place] Patient Advocate Specialist [place] Patient Advocate Team Direct Number: [phone]  Fax: [phone]

## 2021-01-08 NOTE — ED Notes (Signed)
Received verbal report from Sarah W RN at this time 

## 2021-01-08 NOTE — ED Notes (Signed)
Patient assisted with dinner and patient ate 100% of meal.

## 2021-01-08 NOTE — ED Provider Notes (Addendum)
Emergency Medicine Observation Re-evaluation Note  [name] is a 87 y.o. female, seen on rounds today.  Pt initially presented to the ED for complaints of Fall Currently, the patient is resting.  Arm is in a sling.  Of note since the patient has been here a CT of the hip had been added on secondary to developed hip pain.  This identifies left superior and inferior pubic rami fractures.  Chronic right inferior and superior pubic rami fractures as well.  Physical Exam  BP (!) 118/53 (BP Location: Right Arm)   Pulse (!) 59   Temp 98.1 F (36.7 C) (Oral)   Resp 15   Ht 5\' 3"  (1.6 m)   Wt 72.6 kg   SpO2 95%   BMI 28.34 kg/m  Physical Exam General: resting comfortably, NAD Lungs: normal WOB Psych: currently calm and resting  ED Course / MDM  EKG:EKG Interpretation  Date/Time:  Monday January 05 2021 13:46:11 EST Ventricular Rate:  60 PR Interval:  185 QRS Duration: 106 QT Interval:  457 QTC Calculation: 457 R Axis:   -52 Text Interpretation: Sinus rhythm Left anterior fascicular block Abnormal R-wave progression, late transition Since last tracing rate faster Confirmed by [name] [id number]) on 01/05/2021 3:04:15 PM  I have reviewed the labs performed to date as well as medications administered while in observation.  Recent changes in the last 24 hours include none.  Plan  Current plan is for SNF placement.  I spoke with on-call orthopedic PA [name], plan is weightbearing as tolerated in regard to pubic rami fractures.  Currently at rest pain is controlled.  We are pending PT eval.  [name] is not under involuntary commitment.  There was concern for patient's immobile state in regards to developing a DVT, anticoagulation was being considered.  I consulted with pharmacy in regards to Lovenox versus Xarelto for DVT prophylaxis.  They recommend 10 mg of Xarelto daily, they reviewed patient's renal function.  We will transition the patient to this and send a  prescription.   Michelle Gibbs, DO 01/08/21 [id number]    Michelle Gibbs, DO 01/08/21 1335    [name], Michelle Critchley, DO 01/08/21 1557

## 2021-01-08 NOTE — Progress Notes (Signed)
CSW provided patients daughter with the number to [facility]. CSW also asked if her mother qualified for Medicaid as a back up. CSW explained that the hospital and SNF's can sometimes use Medicaid if patients primary insurance denies them. Patients daughter stated that she is POA and she pays all her bills and can barely pay those and that she should qualify.  CSW suggested she applies and that it can take 30-45 to process the application. Patients daughter stated she already knew that. CSW again told the daughter they would try to submit another insurance authorization tomorrow. Patients daughter stated she is calling medicare and some legal people. CSW also asked for another number so CSW can keep her updated. 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 CSW stated well she would need a number to let someone know if the authoirzzarion goes through. Patients daughter stated "Oh no I'm going to be calling legal so there will not be an issue with it going through again". Patients daughter stated CSW can call patients granddaughter with updates because she usually answers the phone.

## 2021-01-08 NOTE — ED Notes (Signed)
Daughter at bedside.

## 2021-01-09 ENCOUNTER — Other Ambulatory Visit: Payer: Self-pay

## 2021-01-09 DIAGNOSIS — S42202A Unspecified fracture of upper end of left humerus, initial encounter for closed fracture: Secondary | ICD-10-CM | POA: Diagnosis not present

## 2021-01-09 DIAGNOSIS — S3282XA Multiple fractures of pelvis without disruption of pelvic ring, initial encounter for closed fracture: Secondary | ICD-10-CM | POA: Diagnosis not present

## 2021-01-09 NOTE — ED Provider Notes (Signed)
Emergency Medicine Observation Re-evaluation Note  [name] is a 87 y.o. female, seen on rounds today.  Pt initially presented to the ED for complaints of Fall Currently, the patient is resting.  Physical Exam  BP (!) 121/55 (BP Location: Right Arm)   Pulse 60   Temp (!) 97.4 F (36.3 C) (Oral)   Resp 14   Ht 5\' 3"  (1.6 m)   Wt 72.6 kg   SpO2 98%   BMI 28.34 kg/m  Physical Exam General: resting comfortably, NAD Lungs: normal WOB Psych: currently calm and resting  ED Course / MDM  EKG:EKG Interpretation  Date/Time:  Monday January 05 2021 13:46:11 EST Ventricular Rate:  60 PR Interval:  185 QRS Duration: 106 QT Interval:  457 QTC Calculation: 457 R Axis:   -52 Text Interpretation: Sinus rhythm Left anterior fascicular block Abnormal R-wave progression, late transition Since last tracing rate faster Confirmed by Dorie Rank [phone]) on 01/05/2021 3:04:15 PM  I have reviewed the labs performed to date as well as medications administered while in observation.  Recent changes in the last 24 hours include none.  Plan  Current plan is for SNF placement or arrangements for home, TOC following.  Jaynie Crumble [facility] is not under involuntary commitment.     Lorelle Gibbs, [place] 01/09/21 [id number]

## 2021-01-09 NOTE — ED Notes (Signed)
PT at bedside to work with patient.

## 2021-01-09 NOTE — ED Notes (Signed)
I set pt's breakfast tray for pt. I opened everything for pt and pt is eating.

## 2021-01-09 NOTE — ED Notes (Signed)
Unable to get pt out of bed due to the fx of the pelvic rami.

## 2021-01-09 NOTE — Progress Notes (Signed)
CSW left an VM with admissions [name] to follow-up with the new authorization submitted today.

## 2021-01-09 NOTE — ED Notes (Signed)
Eating lunch 

## 2021-01-09 NOTE — Progress Notes (Signed)
CSW spoke with admissions at [facility] who stated the paperwork for the new authorization is being submitted. If patient is approved she will have a bed on Monday at [facility].

## 2021-01-09 NOTE — ED Notes (Addendum)
Family at bedside for visit. Explained that Pt had been down to work with patient and she will be discharged to rehab facility.

## 2021-01-09 NOTE — Progress Notes (Signed)
Physical Therapy Treatment Patient Details Name: [name] MRN: [id number] DOB: 02-17-1927 Today's Date: 01/09/2021   History of Present Illness Pt presented to ED 11/28 after fall at home and suffering lt humeral neck fx. CT on 11/29 showed acute nondisplaced lt superior and inferior pubic rami fx's. PMH - arthritis, chf, ckd, dvt, HTN, gout, PAF, TKR, hip fx, pacer    PT Comments    Pt with improved mobility and pain control today. Pt premedicated prior to session. Pt able to sit EOB with much improved balance and fairly comfortable as far as pain goes. Able to perform some exercise there. Wasn't able to stand but expect she will continue to make slow steady progress as pain improves. Typically with non operative fx's like she has this is a slow but steady progression. Continue to recommend SNF for further rehab.    Recommendations for follow up therapy are one component of a multi-disciplinary discharge planning process, led by the attending physician.  Recommendations may be updated based on patient status, additional functional criteria and insurance authorization.  Follow Up Recommendations  Skilled nursing-short term rehab (<3 hours/day)     Assistance Recommended at Discharge Frequent or constant Supervision/Assistance  Equipment Recommendations  Wheelchair (measurements PT);Other (comment);Hospital bed (hoyer lift)    Recommendations for Other Services       Precautions / Restrictions Precautions Precautions: Fall;Other (comment) Required Braces or Orthoses: Sling Restrictions Weight Bearing Restrictions: Yes LUE Weight Bearing: Non weight bearing RLE Weight Bearing: Weight bearing as tolerated LLE Weight Bearing: Weight bearing as tolerated     Mobility  Bed Mobility Overal bed mobility: Needs Assistance Bed Mobility: Supine to Sit;Sit to Supine;Rolling Rolling: Total assist   Supine to sit: +2 for physical assistance;Total assist Sit to supine: +2 for  physical assistance;Total assist   General bed mobility comments: Assist bringing legs off bed. elevating trunk into sitting, and bring hips to EOB. Assist lowering trunk and bringing legs back into bed returning to supine.    Transfers                   General transfer comment: Attempted x 1 to stand but pt not initiating assistance    Ambulation/Gait                   Stairs             Wheelchair Mobility    Modified Rankin (Stroke Patients Only)       Balance Overall balance assessment: Needs assistance;History of Falls Sitting-balance support: No upper extremity supported;Feet supported;Single extremity supported Sitting balance-Leahy Scale: Poor Sitting balance - Comments: Pt sat EOB x 12 minutes. Initially min assist due but quickly able to progress to supervision with static sitting Postural control: Posterior lean                                  Cognition Arousal/Alertness: Awake/alert Behavior During Therapy: WFL for tasks assessed/performed Overall Cognitive Status: Impaired/Different from baseline Area of Impairment: Orientation;Attention;Memory;Following commands;Problem solving;Awareness                 Orientation Level: Disoriented to;Place;Time;Situation Current Attention Level: Sustained Memory: Decreased short-term memory;Decreased recall of precautions Following Commands: Follows one step commands consistently;Follows one step commands with increased time   Awareness: Intellectual Problem Solving: Slow processing;Requires verbal cues;Decreased initiation;Requires tactile cues          Exercises General Exercises -  Lower Extremity Long Arc Quad: AROM;Both;5 reps;Seated    General Comments        Pertinent Vitals/Pain Pain Assessment: Faces Faces Pain Scale: Hurts even more Breathing: normal Negative Vocalization: none Facial Expression: smiling or inexpressive Body Language:  relaxed Consolability: no need to console PAINAD Score: 0 Pain Location: lt shoulder and lt hip Pain Descriptors / Indicators: Grimacing;Guarding;Moaning Pain Intervention(s): Limited activity within patient's tolerance;Premedicated before session;Monitored during session    Home Living                          Prior Function            PT Goals (current goals can now be found in the care plan section) Progress towards PT goals: Progressing toward goals    Frequency    Min 2X/week      PT Plan Current plan remains appropriate    Co-evaluation              AM-PAC PT "6 Clicks" Mobility   Outcome Measure  Help needed turning from your back to your side while in a flat bed without using bedrails?: Total Help needed moving from lying on your back to sitting on the side of a flat bed without using bedrails?: Total Help needed moving to and from a bed to a chair (including a wheelchair)?: Total Help needed standing up from a chair using your arms (e.g., wheelchair or bedside chair)?: Total Help needed to walk in hospital room?: Total Help needed climbing 3-5 steps with a railing? : Total 6 Click Score: 6    End of Session   Activity Tolerance: Patient tolerated treatment well Patient left: in bed;with family/visitor present;with bed alarm set Nurse Communication: Mobility status PT Visit Diagnosis: Other abnormalities of gait and mobility (R26.89);Repeated falls (R29.6);Pain Pain - Right/Left: Left Pain - part of body: Shoulder;Hip     Time: 1400-1420 PT Time Calculation (min) (ACUTE ONLY): 20 min  Charges:  $Therapeutic Activity: 8-22 mins                     [place] Pager [phone] [place] 01/09/2021, 2:56 PM

## 2021-01-09 NOTE — ED Notes (Signed)
Patient resting in bed, denies any needs at this time.

## 2021-01-09 NOTE — ED Notes (Signed)
Pt has 2+ left radial pulse, cap refill less than 3 sec, warm to touch, 4/5 left grip strength. Pt has 2+ posterior tibial pulses bilat, warm to touch, pt able to move feet.

## 2021-01-10 DIAGNOSIS — S3282XA Multiple fractures of pelvis without disruption of pelvic ring, initial encounter for closed fracture: Secondary | ICD-10-CM | POA: Diagnosis not present

## 2021-01-10 DIAGNOSIS — S42202A Unspecified fracture of upper end of left humerus, initial encounter for closed fracture: Secondary | ICD-10-CM | POA: Diagnosis not present

## 2021-01-10 NOTE — ED Notes (Addendum)
daughter at bedside

## 2021-01-10 NOTE — ED Notes (Signed)
Food provided, pt only wanted coffee.

## 2021-01-10 NOTE — Evaluation (Signed)
Clinical/Bedside Swallow Evaluation Patient Details  Name: [name] MRN: [id number] Date of Birth: May 05, 1927  Today's Date: 01/10/2021 Time: SLP Start Time (ACUTE ONLY): 84 SLP Stop Time (ACUTE ONLY): 1215 SLP Time Calculation (min) (ACUTE ONLY): 10 min  Past Medical History:  Past Medical History:  Diagnosis Date   Anemia    "I stay anemic"   Arthritis    Asthma    no problems recent   Cataract    Chronic diastolic CHF (congestive heart failure), NYHA class 2 ([place]) 03/19/2014   Echo 4/19: mod LVH, EF 55-60, no RWMA, Gr 1 DD, MAC, trivial MR, mod LAE, normal RVSF, PASP 19   CKD (chronic kidney disease), stage III (HCC)    Complication of anesthesia    CONFUSED AFTER KNEE SURGERY   Depression    Diverticulitis    DVT (deep venous thrombosis) (HCC)    from birth control, left groin   Essential hypertension    Fibromyalgia 10 y.a.   GERD (gastroesophageal reflux disease)    Gout    Hx of cardiovascular stress test    Lexiscan Myoview (8/15): apical attenuation artifact, no ischemia, EF 69% - low risk.   Hypercholesteremia    Odynophagia    Other abnormal glucose    PAF (paroxysmal atrial fibrillation) (HCC)    Personal history of other diseases of digestive system    Pneumonia    hx of   Presence of permanent cardiac pacemaker    Sinus bradycardia    Skin cancer 2014   skin R elbow & face   Symptomatic bradycardia 06/2016   Past Surgical History:  Past Surgical History:  Procedure Laterality Date   ABDOMINAL HYSTERECTOMY     partial   APPENDECTOMY  ~55years ago   CHOLECYSTECTOMY  10/2005   Dr. Effie Shy   COLONOSCOPY W/ BIOPSIES  2005, 2011   Dr. Bing Plume, "balloon inside for last one at [facility]"   INTRAMEDULLARY (IM) NAIL INTERTROCHANTERIC Left 02/10/2018   Procedure: INTRAMEDULLARY (IM) NAIL INTERTROCHANTRIC;  Surgeon: Rod Can, MD;  Location: [facility];  Service: Orthopedics;  Laterality: Left;   IR THORACENTESIS ASP PLEURAL SPACE W/IMG  GUIDE  06/13/2017   KNEE ARTHROSCOPY Right    LAMINECTOMY  05/2007   foraminotomy, L4-5 laminectomy   LARYNGOSCOPY / BRONCHOSCOPY / ESOPHAGOSCOPY  2010   Dr. Benjamine Mola   PACEMAKER IMPLANT N/A 07/01/2016   Procedure: Pacemaker Implant;  Surgeon: Evans Lance, MD;  Location: [facility];  Service: Cardiovascular;  Laterality: N/A;   SKIN CANCER EXCISION Right ~2005   r elbow   TOTAL KNEE ARTHROPLASTY Left 04/04/2013   Procedure: LEFT TOTAL KNEE ARTHROPLASTY;  Surgeon: Tobi Bastos, MD;  Location: [facility];  Service: Orthopedics;  Laterality: Left;   TUBAL LIGATION     TUMOR REMOVAL Right ~ 20 years ago   tumor in between toes   HPI:  Patient presents to the ED for evaluation after a fall.   Patient states her toe got caught on sheet and she ended up tripping and falling.  She landed on the left side injuring her left shoulder.  Pain is severe.  Patient was not able to get up on her own so EMS was called.  This injury occurred about 30 minutes prior to arrival.  She does not complain of loss of consciousness.  She denies any lower extremity pain.  No chest pain or shortness of breath.  No numbness or weakness.  CT of the head was showing  no evidence of intracranial injury or significant change since recent prior study.  Chest xray was showing chronic bronchitic changes and bibasilar atelectasis without acute infiltrate.  She is pending DC to SNF.    Assessment / Plan / Recommendation  Clinical Impression  Clinical swallowing evaluation was completed using thin liquids via spoon, cup and straw, pureed material and dry solids.  Cranial nerve exam was attempted and not completed as she was unable to follow commands.  She reported trouble swallowing and when asked to elaborate she was only able to state she has a lot of phlegm.  Her oral and pharyngeal swallow appeared functional.    Suspect possible esophageal component as she was noted to belch during this exam.  Mastication of dry solids was slow  but adequate.  Mild oral residue seen post swallow requiring liquids to clear.  Swallow trigger was appreciated to palpation.  Cough x 1 was seen given 1/2 sips of thin liquids via spoon.  It was not replicated given assisted self fed cup or serial straw sips of thin liquids.  Suggest she remain on a regular diet with thin liqudis.   ST follow up is not indicated.  If we can be of further assistance please feel free to reconsult. SLP Visit Diagnosis: Dysphagia, unspecified (R13.10)    Aspiration Risk  Mild aspiration risk    Diet Recommendation   Regular with thin liquids  Medication Administration: Whole meds with liquid    Other  Recommendations Oral Care Recommendations: Oral care BID    Recommendations for follow up therapy are one component of a multi-disciplinary discharge planning process, led by the attending physician.  Recommendations may be updated based on patient status, additional functional criteria and insurance authorization.  Follow up Recommendations No SLP follow up      Assistance Recommended at Discharge Frequent or constant Supervision/Assistance  Functional Status Assessment Patient has not appear to have had a recent decline in their functional status    Swallow Study   General Date of Onset: 01/05/21 HPI: Patient presents to the ED for evaluation after a fall.   Patient states her toe got caught on sheet and she ended up tripping and falling.  She landed on the left side injuring her left shoulder.  Pain is severe.  Patient was not able to get up on her own so EMS was called.  This injury occurred about 30 minutes prior to arrival.  She does not complain of loss of consciousness.  She denies any lower extremity pain.  No chest pain or shortness of breath.  No numbness or weakness.  CT of the head was showing no evidence of intracranial injury or significant change since recent prior study.  Chest xray was showing chronic bronchitic changes and bibasilar atelectasis  without acute infiltrate.  She is pending DC to SNF. Type of Study: Bedside Swallow Evaluation Previous Swallow Assessment: None noted at [facility] Diet Prior to this Study: Regular;Thin liquids Temperature Spikes Noted: No Respiratory Status: Room air History of Recent Intubation: No Behavior/Cognition: Alert;Cooperative;Confused;Requires cueing Oral Cavity Assessment: Within Functional Limits Oral Care Completed by SLP: No Oral Cavity - Dentition: Missing dentition;Adequate natural dentition Vision: Functional for self-feeding Self-Feeding Abilities: Able to feed self Patient Positioning: Upright in bed Baseline Vocal Quality: Normal Volitional Cough: Cognitively unable to elicit Volitional Swallow: Able to elicit    Oral/Motor/Sensory Function Overall Oral Motor/Sensory Function: Other (comment) (Pt unable to follow commands for oral motor exam)   Ice Chips Ice chips: Not  tested   Thin Liquid Thin Liquid: Within functional limits Presentation: Cup;Straw;Spoon;Self Fed    Nectar Thick Nectar Thick Liquid: Not tested   Honey Thick Honey Thick Liquid: Not tested   Puree Puree: Within functional limits Presentation: Spoon   Solid     Solid: Within functional limits Presentation: [place], [place], [facility] Acute [facility] SLP [place] 01/10/2021,1:27 PM

## 2021-01-10 NOTE — ED Notes (Signed)
Patient is resting comfortably. 

## 2021-01-10 NOTE — ED Provider Notes (Signed)
Emergency Medicine Observation Re-evaluation Note  [name] is a 87 y.o. female, seen on rounds today.  Pt initially presented to the ED for complaints of Fall Currently, the patient is sitting upright in no distress.  Physical Exam  BP 133/64 (BP Location: Left Arm)   Pulse 66   Temp 98.4 F (36.9 C) (Oral)   Resp 18   Ht 5\' 3"  (1.6 m)   Wt 72.6 kg   SpO2 98%   BMI 28.34 kg/m  Physical Exam General: No distress Cardiac: Regular rate and rhythm Lungs: No increased work of breathing Psych: Quiet, withdrawn  ED Course / MDM  EKG:EKG Interpretation  Date/Time:  Monday January 05 2021 13:46:11 EST Ventricular Rate:  60 PR Interval:  185 QRS Duration: 106 QT Interval:  457 QTC Calculation: 457 R Axis:   -52 Text Interpretation: Sinus rhythm Left anterior fascicular block Abnormal R-wave progression, late transition Since last tracing rate faster Confirmed by Dorie Rank [phone]) on 01/05/2021 3:04:15 PM  I have reviewed the labs performed to date as well as medications administered while in observation.  Recent changes in the last 24 hours include none aside from speech therapy evaluating, noting, reportedly, no acute need for speech therapy.  Plan  Current plan is for assistance with nursing home placement.  Jaynie Crumble [facility] is not under involuntary commitment.     Carmin Muskrat, MD 01/10/21 1245

## 2021-01-10 NOTE — ED Notes (Signed)
Patient became tearful, states she has a son at home she needs to get to. Called both phone numbers associated with her account, unable to reach anyone. Patient with scrunched faces. Offered pain medication, patient accepted. See MAR.

## 2021-01-10 NOTE — Evaluation (Signed)
Occupational Therapy Evaluation Patient Details Name: [name] MRN: [id number] DOB: [birth year] Today's Date: 01/10/2021   History of Present Illness Pt presented to ED 11/28 after fall at home and suffering lt humeral neck fx. CT on 11/29 showed acute nondisplaced lt superior and inferior pubic rami fx's. PMH - arthritis, chf, ckd, dvt, HTN, gout, PAF, TKR, hip fx, pacer   Clinical Impression   Pt presents with decreased balance, strength, activity tolerance, and cognition. Pt currently requiring Max A for UB ADLs, Total A for LB ADLs, and Total A for bed mobility. Pt is poor historian, however states she needs assistance with ADLs at baseline. Recommend SNF for further rehab at d/c. Will follow acutely.     Recommendations for follow up therapy are one component of a multi-disciplinary discharge planning process, led by the attending physician.  Recommendations may be updated based on patient status, additional functional criteria and insurance authorization.   Follow Up Recommendations  Skilled nursing-short term rehab (<3 hours/day)    Assistance Recommended at Discharge Frequent or constant Supervision/Assistance  Functional Status Assessment  Patient has had a recent decline in their functional status and demonstrates the ability to make significant improvements in function in a reasonable and predictable amount of time.  Equipment Recommendations  Other (comment) (Defer)    Recommendations for Other Services       Precautions / Restrictions Precautions Precautions: Fall Required Braces or Orthoses: Sling Restrictions Weight Bearing Restrictions: Yes LUE Weight Bearing: Non weight bearing RLE Weight Bearing: Weight bearing as tolerated LLE Weight Bearing: Weight bearing as tolerated      Mobility Bed Mobility Overal bed mobility: Needs Assistance Bed Mobility: Supine to Sit;Sit to Supine     Supine to sit: Total assist;+2 for physical assistance Sit to supine:  Total assist;+2 for physical assistance        Transfers                          Balance Overall balance assessment: Needs assistance;History of Falls Sitting-balance support: Feet supported;Single extremity supported Sitting balance-Leahy Scale: Poor                                     ADL either performed or assessed with clinical judgement   ADL Overall ADL's : Needs assistance/impaired Eating/Feeding: Set up   Grooming: Moderate assistance   Upper Body Bathing: Maximal assistance;Bed level   Lower Body Bathing: Total assistance;Bed level   Upper Body Dressing : Maximal assistance   Lower Body Dressing: Total assistance       Toileting- Clothing Manipulation and Hygiene: Total assistance               Vision Baseline Vision/History: 1 Wears glasses       Perception     Praxis      Pertinent Vitals/Pain Pain Assessment: Faces Faces Pain Scale: Hurts whole lot Pain Location: L shoulder, L hip Pain Descriptors / Indicators: Grimacing;Guarding;Moaning Pain Intervention(s): Limited activity within patient's tolerance;Monitored during session;Repositioned     Hand Dominance Right   Extremity/Trunk Assessment Upper Extremity Assessment Upper Extremity Assessment: Generalized weakness;LUE deficits/detail LUE Deficits / Details: Humeral fx, sling in place   Lower Extremity Assessment Lower Extremity Assessment: Defer to PT evaluation       Communication Communication Communication: HOH   Cognition Arousal/Alertness: Awake/alert Behavior During Therapy: WFL for tasks assessed/performed Overall Cognitive  Status: Impaired/Different from baseline Area of Impairment: Orientation;Attention;Memory;Following commands;Problem solving;Awareness                 Orientation Level: Disoriented to;Place;Time;Situation Current Attention Level: Sustained Memory: Decreased short-term memory;Decreased recall of  precautions Following Commands: Follows one step commands consistently;Follows one step commands with increased time   Awareness: Intellectual Problem Solving: Slow processing;Requires verbal cues;Decreased initiation;Requires tactile cues       General Comments       Exercises     Shoulder Instructions      Home Living Family/patient expects to be discharged to:: Skilled nursing facility                                        Prior Functioning/Environment Prior Level of Function : History of Falls (last six months);Needs assist             Mobility Comments: Pt ambulating with walker ADLs Comments: Pt states she receives assistance with ADLs        OT Problem List: Decreased strength;Decreased range of motion;Decreased activity tolerance;Impaired balance (sitting and/or standing);Decreased coordination;Decreased cognition;Decreased safety awareness;Decreased knowledge of use of DME or AE;Decreased knowledge of precautions;Impaired UE functional use;Pain      OT Treatment/Interventions: Self-care/ADL training;Therapeutic exercise;Neuromuscular education;Energy conservation;DME and/or AE instruction;Manual therapy;Therapeutic activities;Cognitive remediation/compensation;Patient/family education;Balance training    OT Goals(Current goals can be found in the care plan section) Acute Rehab OT Goals Patient Stated Goal: none stated OT Goal Formulation: With patient Time For Goal Achievement: 01/24/21 Potential to Achieve Goals: Fair  OT Frequency: Min 2X/week   Barriers to D/C:            Co-evaluation              AM-PAC OT "6 Clicks" Daily Activity     Outcome Measure Help from another person eating meals?: A Little Help from another person taking care of personal grooming?: A Lot Help from another person toileting, which includes using toliet, bedpan, or urinal?: Total Help from another person bathing (including washing, rinsing, drying)?:  Total Help from another person to put on and taking off regular upper body clothing?: A Lot Help from another person to put on and taking off regular lower body clothing?: Total 6 Click Score: 10   End of Session Nurse Communication: Mobility status  Activity Tolerance: Patient limited by pain Patient left: in bed;with call bell/phone within reach  OT Visit Diagnosis: Unsteadiness on feet (R26.81);Other abnormalities of gait and mobility (R26.89);History of falling (Z91.81);Muscle weakness (generalized) (M62.81);Other symptoms and signs involving cognitive function;Pain Pain - Right/Left: Left Pain - part of body: Shoulder;Hip                Time: 1216-1229 OT Time Calculation (min): 13 min Charges:  OT General Charges $OT Visit: 1 Visit OT Evaluation $OT Eval Moderate Complexity: 1 Mod  Suni Jarnagin C, OT/L  Acute Rehab [place] 01/10/2021, 1:08 PM

## 2021-01-11 NOTE — ED Notes (Signed)
Pt asleep with family at bedside

## 2021-01-11 NOTE — ED Notes (Signed)
Pt eating dinner with family at bedside.

## 2021-01-11 NOTE — ED Notes (Signed)
Pt is sleeping in NAD at this time

## 2021-01-12 DIAGNOSIS — S0990XA Unspecified injury of head, initial encounter: Secondary | ICD-10-CM | POA: Diagnosis not present

## 2021-01-12 DIAGNOSIS — I130 Hypertensive heart and chronic kidney disease with heart failure and stage 1 through stage 4 chronic kidney disease, or unspecified chronic kidney disease: Secondary | ICD-10-CM | POA: Diagnosis not present

## 2021-01-12 DIAGNOSIS — K219 Gastro-esophageal reflux disease without esophagitis: Secondary | ICD-10-CM | POA: Diagnosis not present

## 2021-01-12 DIAGNOSIS — J189 Pneumonia, unspecified organism: Secondary | ICD-10-CM | POA: Diagnosis not present

## 2021-01-12 DIAGNOSIS — I959 Hypotension, unspecified: Secondary | ICD-10-CM | POA: Diagnosis not present

## 2021-01-12 DIAGNOSIS — I10 Essential (primary) hypertension: Secondary | ICD-10-CM | POA: Diagnosis not present

## 2021-01-12 DIAGNOSIS — J45909 Unspecified asthma, uncomplicated: Secondary | ICD-10-CM | POA: Diagnosis not present

## 2021-01-12 DIAGNOSIS — I480 Paroxysmal atrial fibrillation: Secondary | ICD-10-CM | POA: Diagnosis not present

## 2021-01-12 DIAGNOSIS — M109 Gout, unspecified: Secondary | ICD-10-CM | POA: Diagnosis not present

## 2021-01-12 DIAGNOSIS — M79602 Pain in left arm: Secondary | ICD-10-CM | POA: Diagnosis not present

## 2021-01-12 DIAGNOSIS — Z85828 Personal history of other malignant neoplasm of skin: Secondary | ICD-10-CM | POA: Diagnosis not present

## 2021-01-12 DIAGNOSIS — I4891 Unspecified atrial fibrillation: Secondary | ICD-10-CM | POA: Diagnosis not present

## 2021-01-12 DIAGNOSIS — S42302D Unspecified fracture of shaft of humerus, left arm, subsequent encounter for fracture with routine healing: Secondary | ICD-10-CM | POA: Diagnosis not present

## 2021-01-12 DIAGNOSIS — G9341 Metabolic encephalopathy: Secondary | ICD-10-CM | POA: Diagnosis not present

## 2021-01-12 DIAGNOSIS — Z87891 Personal history of nicotine dependence: Secondary | ICD-10-CM | POA: Diagnosis not present

## 2021-01-12 DIAGNOSIS — S72009A Fracture of unspecified part of neck of unspecified femur, initial encounter for closed fracture: Secondary | ICD-10-CM | POA: Diagnosis not present

## 2021-01-12 DIAGNOSIS — S42202D Unspecified fracture of upper end of left humerus, subsequent encounter for fracture with routine healing: Secondary | ICD-10-CM | POA: Diagnosis not present

## 2021-01-12 DIAGNOSIS — Z7401 Bed confinement status: Secondary | ICD-10-CM | POA: Diagnosis not present

## 2021-01-12 DIAGNOSIS — Z20822 Contact with and (suspected) exposure to covid-19: Secondary | ICD-10-CM | POA: Diagnosis not present

## 2021-01-12 DIAGNOSIS — Z96652 Presence of left artificial knee joint: Secondary | ICD-10-CM | POA: Diagnosis not present

## 2021-01-12 DIAGNOSIS — E860 Dehydration: Secondary | ICD-10-CM | POA: Diagnosis not present

## 2021-01-12 DIAGNOSIS — W010XXA Fall on same level from slipping, tripping and stumbling without subsequent striking against object, initial encounter: Secondary | ICD-10-CM | POA: Diagnosis not present

## 2021-01-12 DIAGNOSIS — R69 Illness, unspecified: Secondary | ICD-10-CM | POA: Diagnosis not present

## 2021-01-12 DIAGNOSIS — G930 Cerebral cysts: Secondary | ICD-10-CM | POA: Diagnosis not present

## 2021-01-12 DIAGNOSIS — M25512 Pain in left shoulder: Secondary | ICD-10-CM | POA: Diagnosis not present

## 2021-01-12 DIAGNOSIS — Z79899 Other long term (current) drug therapy: Secondary | ICD-10-CM | POA: Diagnosis not present

## 2021-01-12 DIAGNOSIS — K5900 Constipation, unspecified: Secondary | ICD-10-CM | POA: Diagnosis not present

## 2021-01-12 DIAGNOSIS — D631 Anemia in chronic kidney disease: Secondary | ICD-10-CM | POA: Diagnosis not present

## 2021-01-12 DIAGNOSIS — Z7982 Long term (current) use of aspirin: Secondary | ICD-10-CM | POA: Diagnosis not present

## 2021-01-12 DIAGNOSIS — E875 Hyperkalemia: Secondary | ICD-10-CM | POA: Diagnosis not present

## 2021-01-12 DIAGNOSIS — I5032 Chronic diastolic (congestive) heart failure: Secondary | ICD-10-CM | POA: Diagnosis not present

## 2021-01-12 DIAGNOSIS — Y92009 Unspecified place in unspecified non-institutional (private) residence as the place of occurrence of the external cause: Secondary | ICD-10-CM | POA: Diagnosis not present

## 2021-01-12 DIAGNOSIS — F29 Unspecified psychosis not due to a substance or known physiological condition: Secondary | ICD-10-CM | POA: Diagnosis not present

## 2021-01-12 DIAGNOSIS — N183 Chronic kidney disease, stage 3 unspecified: Secondary | ICD-10-CM | POA: Diagnosis not present

## 2021-01-12 DIAGNOSIS — Z7951 Long term (current) use of inhaled steroids: Secondary | ICD-10-CM | POA: Diagnosis not present

## 2021-01-12 DIAGNOSIS — R531 Weakness: Secondary | ICD-10-CM | POA: Diagnosis not present

## 2021-01-12 DIAGNOSIS — S4991XA Unspecified injury of right shoulder and upper arm, initial encounter: Secondary | ICD-10-CM | POA: Diagnosis not present

## 2021-01-12 DIAGNOSIS — S3282XA Multiple fractures of pelvis without disruption of pelvic ring, initial encounter for closed fracture: Secondary | ICD-10-CM | POA: Diagnosis not present

## 2021-01-12 DIAGNOSIS — S42202A Unspecified fracture of upper end of left humerus, initial encounter for closed fracture: Secondary | ICD-10-CM | POA: Diagnosis not present

## 2021-01-12 MED ORDER — DOCUSATE SODIUM 100 MG PO CAPS
100.0000 mg | ORAL_CAPSULE | Freq: Every day | ORAL | Status: DC | PRN
  Administered 2021-01-12: 100 mg via ORAL
  Filled 2021-01-12: qty 1

## 2021-01-12 MED ORDER — HYDROCODONE-ACETAMINOPHEN 5-325 MG PO TABS: 1.0000 | ORAL_TABLET | ORAL | 0 refills | Status: DC | PRN

## 2021-01-12 NOTE — Progress Notes (Addendum)
Pt cleared to go to [facility] Number for report [phone]  Secretary Deloris contacted to call [facility]

## 2021-01-12 NOTE — ED Notes (Addendum)
error 

## 2021-01-12 NOTE — ED Notes (Signed)
Eating lunch 

## 2021-01-12 NOTE — Progress Notes (Signed)
Insurance authorization is still pending for SNF placement.

## 2021-01-12 NOTE — ED Provider Notes (Signed)
Pt has been accepted to [facility].  [facility] is here to take her there.  She is stable for d/c.  She is to f/u with ortho.  Return if worse.    Isla Pence, MD 01/12/21 2139

## 2021-01-12 NOTE — ED Notes (Signed)
Report called to Myrtie Hawk at [facility] facility.

## 2021-01-12 NOTE — Progress Notes (Signed)
Remote pacemaker transmission.   

## 2021-01-12 NOTE — ED Notes (Signed)
Breakfast orders placed 

## 2021-01-12 NOTE — ED Notes (Signed)
Patient discharged with [facility] for transport to rehab facility. Family at bedside. Patient in possession of all belongings, discharge paperwork given to ems.

## 2021-01-12 NOTE — ED Provider Notes (Signed)
  Physical Exam  BP (!) 124/53 (BP Location: Right Arm)   Pulse (!) 59   Temp 97.9 F (36.6 C) (Oral)   Resp 18   Ht 5\' 3"  (1.6 m)   Wt 72.6 kg   SpO2 96%   BMI 28.34 kg/m   Physical Exam  ED Course/Procedures   Clinical Course as of 01/12/21 0800  Mon Jan 05, 2021  1532 Imaging tests are still waiting for formal radiology read.  Preliminary review of shoulder x-ray does suggest proximal humerus fracture [JK]    Clinical Course User Index [JK] Dorie Rank, MD    Procedures  MDM  Patient's still pending skilled nursing placement.  Hopefully now it is Monday this can be expedited.       Davonna Belling, MD 01/12/21 [phone]

## 2021-01-13 DIAGNOSIS — M79602 Pain in left arm: Secondary | ICD-10-CM | POA: Diagnosis not present

## 2021-01-13 DIAGNOSIS — I10 Essential (primary) hypertension: Secondary | ICD-10-CM | POA: Diagnosis not present

## 2021-01-13 DIAGNOSIS — S42302D Unspecified fracture of shaft of humerus, left arm, subsequent encounter for fracture with routine healing: Secondary | ICD-10-CM | POA: Diagnosis not present

## 2021-01-13 DIAGNOSIS — I480 Paroxysmal atrial fibrillation: Secondary | ICD-10-CM | POA: Diagnosis not present

## 2021-01-15 DIAGNOSIS — K219 Gastro-esophageal reflux disease without esophagitis: Secondary | ICD-10-CM | POA: Diagnosis not present

## 2021-01-15 DIAGNOSIS — M25512 Pain in left shoulder: Secondary | ICD-10-CM | POA: Diagnosis not present

## 2021-01-15 DIAGNOSIS — R531 Weakness: Secondary | ICD-10-CM | POA: Diagnosis not present

## 2021-01-15 DIAGNOSIS — S42202D Unspecified fracture of upper end of left humerus, subsequent encounter for fracture with routine healing: Secondary | ICD-10-CM | POA: Diagnosis not present

## 2021-01-16 DIAGNOSIS — G930 Cerebral cysts: Secondary | ICD-10-CM | POA: Diagnosis not present

## 2021-01-16 DIAGNOSIS — J189 Pneumonia, unspecified organism: Secondary | ICD-10-CM | POA: Diagnosis not present

## 2021-01-16 DIAGNOSIS — S42202A Unspecified fracture of upper end of left humerus, initial encounter for closed fracture: Secondary | ICD-10-CM | POA: Diagnosis not present

## 2021-01-16 DIAGNOSIS — I480 Paroxysmal atrial fibrillation: Secondary | ICD-10-CM | POA: Diagnosis not present

## 2021-01-19 DIAGNOSIS — K219 Gastro-esophageal reflux disease without esophagitis: Secondary | ICD-10-CM | POA: Diagnosis not present

## 2021-01-19 DIAGNOSIS — M109 Gout, unspecified: Secondary | ICD-10-CM | POA: Diagnosis not present

## 2021-01-19 DIAGNOSIS — J189 Pneumonia, unspecified organism: Secondary | ICD-10-CM | POA: Diagnosis not present

## 2021-01-19 DIAGNOSIS — S42202A Unspecified fracture of upper end of left humerus, initial encounter for closed fracture: Secondary | ICD-10-CM | POA: Diagnosis not present

## 2021-01-20 DIAGNOSIS — S42202D Unspecified fracture of upper end of left humerus, subsequent encounter for fracture with routine healing: Secondary | ICD-10-CM | POA: Diagnosis not present

## 2021-01-20 DIAGNOSIS — R531 Weakness: Secondary | ICD-10-CM | POA: Diagnosis not present

## 2021-01-20 DIAGNOSIS — E860 Dehydration: Secondary | ICD-10-CM | POA: Diagnosis not present

## 2021-01-23 DIAGNOSIS — I480 Paroxysmal atrial fibrillation: Secondary | ICD-10-CM | POA: Diagnosis not present

## 2021-01-23 DIAGNOSIS — J189 Pneumonia, unspecified organism: Secondary | ICD-10-CM | POA: Diagnosis not present

## 2021-01-29 DIAGNOSIS — D649 Anemia, unspecified: Secondary | ICD-10-CM | POA: Diagnosis not present

## 2021-01-29 DIAGNOSIS — I129 Hypertensive chronic kidney disease with stage 1 through stage 4 chronic kidney disease, or unspecified chronic kidney disease: Secondary | ICD-10-CM | POA: Diagnosis not present

## 2021-01-29 DIAGNOSIS — M1990 Unspecified osteoarthritis, unspecified site: Secondary | ICD-10-CM | POA: Diagnosis not present

## 2021-01-29 DIAGNOSIS — I10 Essential (primary) hypertension: Secondary | ICD-10-CM | POA: Diagnosis not present

## 2021-01-29 DIAGNOSIS — J411 Mucopurulent chronic bronchitis: Secondary | ICD-10-CM | POA: Diagnosis not present

## 2021-01-29 DIAGNOSIS — K219 Gastro-esophageal reflux disease without esophagitis: Secondary | ICD-10-CM | POA: Diagnosis not present

## 2021-01-29 DIAGNOSIS — S42202D Unspecified fracture of upper end of left humerus, subsequent encounter for fracture with routine healing: Secondary | ICD-10-CM | POA: Diagnosis not present

## 2021-01-29 DIAGNOSIS — R69 Illness, unspecified: Secondary | ICD-10-CM | POA: Diagnosis not present

## 2021-01-29 DIAGNOSIS — I4891 Unspecified atrial fibrillation: Secondary | ICD-10-CM | POA: Diagnosis not present

## 2021-01-29 DIAGNOSIS — E782 Mixed hyperlipidemia: Secondary | ICD-10-CM | POA: Diagnosis not present

## 2021-01-29 DIAGNOSIS — J441 Chronic obstructive pulmonary disease with (acute) exacerbation: Secondary | ICD-10-CM | POA: Diagnosis not present

## 2021-01-30 DIAGNOSIS — S42202D Unspecified fracture of upper end of left humerus, subsequent encounter for fracture with routine healing: Secondary | ICD-10-CM | POA: Diagnosis not present

## 2021-02-02 DIAGNOSIS — S42202D Unspecified fracture of upper end of left humerus, subsequent encounter for fracture with routine healing: Secondary | ICD-10-CM | POA: Diagnosis not present

## 2021-02-03 DIAGNOSIS — S42202D Unspecified fracture of upper end of left humerus, subsequent encounter for fracture with routine healing: Secondary | ICD-10-CM | POA: Diagnosis not present

## 2021-02-04 DIAGNOSIS — S42202D Unspecified fracture of upper end of left humerus, subsequent encounter for fracture with routine healing: Secondary | ICD-10-CM | POA: Diagnosis not present

## 2021-02-05 DIAGNOSIS — S42202D Unspecified fracture of upper end of left humerus, subsequent encounter for fracture with routine healing: Secondary | ICD-10-CM | POA: Diagnosis not present

## 2021-02-06 DIAGNOSIS — S42202D Unspecified fracture of upper end of left humerus, subsequent encounter for fracture with routine healing: Secondary | ICD-10-CM | POA: Diagnosis not present

## 2021-02-07 DIAGNOSIS — S42202D Unspecified fracture of upper end of left humerus, subsequent encounter for fracture with routine healing: Secondary | ICD-10-CM | POA: Diagnosis not present

## 2021-02-09 DIAGNOSIS — G9341 Metabolic encephalopathy: Secondary | ICD-10-CM | POA: Diagnosis not present

## 2021-02-09 DIAGNOSIS — I10 Essential (primary) hypertension: Secondary | ICD-10-CM | POA: Diagnosis not present

## 2021-02-09 DIAGNOSIS — S42202D Unspecified fracture of upper end of left humerus, subsequent encounter for fracture with routine healing: Secondary | ICD-10-CM | POA: Diagnosis not present

## 2021-02-09 DIAGNOSIS — R69 Illness, unspecified: Secondary | ICD-10-CM | POA: Diagnosis not present

## 2021-02-09 DIAGNOSIS — R41841 Cognitive communication deficit: Secondary | ICD-10-CM | POA: Diagnosis not present

## 2021-02-09 DIAGNOSIS — M6281 Muscle weakness (generalized): Secondary | ICD-10-CM | POA: Diagnosis not present

## 2021-02-09 DIAGNOSIS — R2689 Other abnormalities of gait and mobility: Secondary | ICD-10-CM | POA: Diagnosis not present

## 2021-02-10 DIAGNOSIS — M79661 Pain in right lower leg: Secondary | ICD-10-CM | POA: Diagnosis not present

## 2021-02-10 DIAGNOSIS — R2243 Localized swelling, mass and lump, lower limb, bilateral: Secondary | ICD-10-CM | POA: Diagnosis not present

## 2021-02-10 DIAGNOSIS — G9341 Metabolic encephalopathy: Secondary | ICD-10-CM | POA: Diagnosis not present

## 2021-02-10 DIAGNOSIS — I5032 Chronic diastolic (congestive) heart failure: Secondary | ICD-10-CM | POA: Diagnosis not present

## 2021-02-10 DIAGNOSIS — R69 Illness, unspecified: Secondary | ICD-10-CM | POA: Diagnosis not present

## 2021-02-10 DIAGNOSIS — M6281 Muscle weakness (generalized): Secondary | ICD-10-CM | POA: Diagnosis not present

## 2021-02-10 DIAGNOSIS — M79662 Pain in left lower leg: Secondary | ICD-10-CM | POA: Diagnosis not present

## 2021-02-10 DIAGNOSIS — R41841 Cognitive communication deficit: Secondary | ICD-10-CM | POA: Diagnosis not present

## 2021-02-10 DIAGNOSIS — R2689 Other abnormalities of gait and mobility: Secondary | ICD-10-CM | POA: Diagnosis not present

## 2021-02-10 DIAGNOSIS — I10 Essential (primary) hypertension: Secondary | ICD-10-CM | POA: Diagnosis not present

## 2021-02-10 DIAGNOSIS — S42202D Unspecified fracture of upper end of left humerus, subsequent encounter for fracture with routine healing: Secondary | ICD-10-CM | POA: Diagnosis not present

## 2021-02-11 DIAGNOSIS — R41841 Cognitive communication deficit: Secondary | ICD-10-CM | POA: Diagnosis not present

## 2021-02-11 DIAGNOSIS — I10 Essential (primary) hypertension: Secondary | ICD-10-CM | POA: Diagnosis not present

## 2021-02-11 DIAGNOSIS — R2689 Other abnormalities of gait and mobility: Secondary | ICD-10-CM | POA: Diagnosis not present

## 2021-02-11 DIAGNOSIS — J189 Pneumonia, unspecified organism: Secondary | ICD-10-CM | POA: Diagnosis not present

## 2021-02-11 DIAGNOSIS — A000 Cholera due to Vibrio cholerae 01, biovar cholerae: Secondary | ICD-10-CM | POA: Diagnosis not present

## 2021-02-11 DIAGNOSIS — S42202D Unspecified fracture of upper end of left humerus, subsequent encounter for fracture with routine healing: Secondary | ICD-10-CM | POA: Diagnosis not present

## 2021-02-11 DIAGNOSIS — S42202A Unspecified fracture of upper end of left humerus, initial encounter for closed fracture: Secondary | ICD-10-CM | POA: Diagnosis not present

## 2021-02-11 DIAGNOSIS — G9341 Metabolic encephalopathy: Secondary | ICD-10-CM | POA: Diagnosis not present

## 2021-02-11 DIAGNOSIS — I110 Hypertensive heart disease with heart failure: Secondary | ICD-10-CM | POA: Diagnosis not present

## 2021-02-11 DIAGNOSIS — R69 Illness, unspecified: Secondary | ICD-10-CM | POA: Diagnosis not present

## 2021-02-11 DIAGNOSIS — M6281 Muscle weakness (generalized): Secondary | ICD-10-CM | POA: Diagnosis not present

## 2021-02-12 DIAGNOSIS — M109 Gout, unspecified: Secondary | ICD-10-CM | POA: Diagnosis not present

## 2021-02-12 DIAGNOSIS — K219 Gastro-esophageal reflux disease without esophagitis: Secondary | ICD-10-CM | POA: Diagnosis not present

## 2021-02-12 DIAGNOSIS — S42202D Unspecified fracture of upper end of left humerus, subsequent encounter for fracture with routine healing: Secondary | ICD-10-CM | POA: Diagnosis not present

## 2021-02-12 DIAGNOSIS — M6281 Muscle weakness (generalized): Secondary | ICD-10-CM | POA: Diagnosis not present

## 2021-02-12 DIAGNOSIS — G9341 Metabolic encephalopathy: Secondary | ICD-10-CM | POA: Diagnosis not present

## 2021-02-12 DIAGNOSIS — R69 Illness, unspecified: Secondary | ICD-10-CM | POA: Diagnosis not present

## 2021-02-12 DIAGNOSIS — R2689 Other abnormalities of gait and mobility: Secondary | ICD-10-CM | POA: Diagnosis not present

## 2021-02-12 DIAGNOSIS — R41841 Cognitive communication deficit: Secondary | ICD-10-CM | POA: Diagnosis not present

## 2021-02-12 DIAGNOSIS — I10 Essential (primary) hypertension: Secondary | ICD-10-CM | POA: Diagnosis not present

## 2021-02-13 DIAGNOSIS — I10 Essential (primary) hypertension: Secondary | ICD-10-CM | POA: Diagnosis not present

## 2021-02-13 DIAGNOSIS — R2689 Other abnormalities of gait and mobility: Secondary | ICD-10-CM | POA: Diagnosis not present

## 2021-02-13 DIAGNOSIS — S42202D Unspecified fracture of upper end of left humerus, subsequent encounter for fracture with routine healing: Secondary | ICD-10-CM | POA: Diagnosis not present

## 2021-02-13 DIAGNOSIS — M6281 Muscle weakness (generalized): Secondary | ICD-10-CM | POA: Diagnosis not present

## 2021-02-13 DIAGNOSIS — R69 Illness, unspecified: Secondary | ICD-10-CM | POA: Diagnosis not present

## 2021-02-13 DIAGNOSIS — R41841 Cognitive communication deficit: Secondary | ICD-10-CM | POA: Diagnosis not present

## 2021-02-13 DIAGNOSIS — G9341 Metabolic encephalopathy: Secondary | ICD-10-CM | POA: Diagnosis not present

## 2021-02-14 DIAGNOSIS — I10 Essential (primary) hypertension: Secondary | ICD-10-CM | POA: Diagnosis not present

## 2021-02-14 DIAGNOSIS — M6281 Muscle weakness (generalized): Secondary | ICD-10-CM | POA: Diagnosis not present

## 2021-02-14 DIAGNOSIS — R41841 Cognitive communication deficit: Secondary | ICD-10-CM | POA: Diagnosis not present

## 2021-02-14 DIAGNOSIS — G9341 Metabolic encephalopathy: Secondary | ICD-10-CM | POA: Diagnosis not present

## 2021-02-14 DIAGNOSIS — R69 Illness, unspecified: Secondary | ICD-10-CM | POA: Diagnosis not present

## 2021-02-14 DIAGNOSIS — R2689 Other abnormalities of gait and mobility: Secondary | ICD-10-CM | POA: Diagnosis not present

## 2021-02-14 DIAGNOSIS — S42202D Unspecified fracture of upper end of left humerus, subsequent encounter for fracture with routine healing: Secondary | ICD-10-CM | POA: Diagnosis not present

## 2021-02-16 DIAGNOSIS — R2689 Other abnormalities of gait and mobility: Secondary | ICD-10-CM | POA: Diagnosis not present

## 2021-02-16 DIAGNOSIS — G9341 Metabolic encephalopathy: Secondary | ICD-10-CM | POA: Diagnosis not present

## 2021-02-16 DIAGNOSIS — R2243 Localized swelling, mass and lump, lower limb, bilateral: Secondary | ICD-10-CM | POA: Diagnosis not present

## 2021-02-16 DIAGNOSIS — S42202D Unspecified fracture of upper end of left humerus, subsequent encounter for fracture with routine healing: Secondary | ICD-10-CM | POA: Diagnosis not present

## 2021-02-16 DIAGNOSIS — R41841 Cognitive communication deficit: Secondary | ICD-10-CM | POA: Diagnosis not present

## 2021-02-16 DIAGNOSIS — I10 Essential (primary) hypertension: Secondary | ICD-10-CM | POA: Diagnosis not present

## 2021-02-16 DIAGNOSIS — R410 Disorientation, unspecified: Secondary | ICD-10-CM | POA: Diagnosis not present

## 2021-02-16 DIAGNOSIS — M6281 Muscle weakness (generalized): Secondary | ICD-10-CM | POA: Diagnosis not present

## 2021-02-16 DIAGNOSIS — M79602 Pain in left arm: Secondary | ICD-10-CM | POA: Diagnosis not present

## 2021-02-16 DIAGNOSIS — R69 Illness, unspecified: Secondary | ICD-10-CM | POA: Diagnosis not present

## 2021-02-17 DIAGNOSIS — G9341 Metabolic encephalopathy: Secondary | ICD-10-CM | POA: Diagnosis not present

## 2021-02-17 DIAGNOSIS — R41841 Cognitive communication deficit: Secondary | ICD-10-CM | POA: Diagnosis not present

## 2021-02-17 DIAGNOSIS — M6281 Muscle weakness (generalized): Secondary | ICD-10-CM | POA: Diagnosis not present

## 2021-02-17 DIAGNOSIS — S42202D Unspecified fracture of upper end of left humerus, subsequent encounter for fracture with routine healing: Secondary | ICD-10-CM | POA: Diagnosis not present

## 2021-02-17 DIAGNOSIS — N390 Urinary tract infection, site not specified: Secondary | ICD-10-CM | POA: Diagnosis not present

## 2021-02-17 DIAGNOSIS — R2689 Other abnormalities of gait and mobility: Secondary | ICD-10-CM | POA: Diagnosis not present

## 2021-02-17 DIAGNOSIS — R69 Illness, unspecified: Secondary | ICD-10-CM | POA: Diagnosis not present

## 2021-02-17 DIAGNOSIS — I10 Essential (primary) hypertension: Secondary | ICD-10-CM | POA: Diagnosis not present

## 2021-02-17 DIAGNOSIS — I110 Hypertensive heart disease with heart failure: Secondary | ICD-10-CM | POA: Diagnosis not present

## 2021-02-18 DIAGNOSIS — R69 Illness, unspecified: Secondary | ICD-10-CM | POA: Diagnosis not present

## 2021-02-18 DIAGNOSIS — S42202D Unspecified fracture of upper end of left humerus, subsequent encounter for fracture with routine healing: Secondary | ICD-10-CM | POA: Diagnosis not present

## 2021-02-18 DIAGNOSIS — M6281 Muscle weakness (generalized): Secondary | ICD-10-CM | POA: Diagnosis not present

## 2021-02-18 DIAGNOSIS — I10 Essential (primary) hypertension: Secondary | ICD-10-CM | POA: Diagnosis not present

## 2021-02-18 DIAGNOSIS — R41841 Cognitive communication deficit: Secondary | ICD-10-CM | POA: Diagnosis not present

## 2021-02-18 DIAGNOSIS — R2689 Other abnormalities of gait and mobility: Secondary | ICD-10-CM | POA: Diagnosis not present

## 2021-02-18 DIAGNOSIS — G9341 Metabolic encephalopathy: Secondary | ICD-10-CM | POA: Diagnosis not present

## 2021-02-19 DIAGNOSIS — S42202D Unspecified fracture of upper end of left humerus, subsequent encounter for fracture with routine healing: Secondary | ICD-10-CM | POA: Diagnosis not present

## 2021-02-19 DIAGNOSIS — R2689 Other abnormalities of gait and mobility: Secondary | ICD-10-CM | POA: Diagnosis not present

## 2021-02-19 DIAGNOSIS — G9341 Metabolic encephalopathy: Secondary | ICD-10-CM | POA: Diagnosis not present

## 2021-02-19 DIAGNOSIS — I10 Essential (primary) hypertension: Secondary | ICD-10-CM | POA: Diagnosis not present

## 2021-02-19 DIAGNOSIS — R41841 Cognitive communication deficit: Secondary | ICD-10-CM | POA: Diagnosis not present

## 2021-02-19 DIAGNOSIS — N390 Urinary tract infection, site not specified: Secondary | ICD-10-CM | POA: Diagnosis not present

## 2021-02-19 DIAGNOSIS — R69 Illness, unspecified: Secondary | ICD-10-CM | POA: Diagnosis not present

## 2021-02-19 DIAGNOSIS — M6281 Muscle weakness (generalized): Secondary | ICD-10-CM | POA: Diagnosis not present

## 2021-02-20 DIAGNOSIS — N1832 Chronic kidney disease, stage 3b: Secondary | ICD-10-CM | POA: Diagnosis not present

## 2021-02-20 DIAGNOSIS — R41841 Cognitive communication deficit: Secondary | ICD-10-CM | POA: Diagnosis not present

## 2021-02-20 DIAGNOSIS — G9341 Metabolic encephalopathy: Secondary | ICD-10-CM | POA: Diagnosis not present

## 2021-02-20 DIAGNOSIS — S42202D Unspecified fracture of upper end of left humerus, subsequent encounter for fracture with routine healing: Secondary | ICD-10-CM | POA: Diagnosis not present

## 2021-02-20 DIAGNOSIS — M6281 Muscle weakness (generalized): Secondary | ICD-10-CM | POA: Diagnosis not present

## 2021-02-20 DIAGNOSIS — D649 Anemia, unspecified: Secondary | ICD-10-CM | POA: Diagnosis not present

## 2021-02-20 DIAGNOSIS — R410 Disorientation, unspecified: Secondary | ICD-10-CM | POA: Diagnosis not present

## 2021-02-20 DIAGNOSIS — I5032 Chronic diastolic (congestive) heart failure: Secondary | ICD-10-CM | POA: Diagnosis not present

## 2021-02-20 DIAGNOSIS — R69 Illness, unspecified: Secondary | ICD-10-CM | POA: Diagnosis not present

## 2021-02-20 DIAGNOSIS — R2689 Other abnormalities of gait and mobility: Secondary | ICD-10-CM | POA: Diagnosis not present

## 2021-02-20 DIAGNOSIS — I10 Essential (primary) hypertension: Secondary | ICD-10-CM | POA: Diagnosis not present

## 2021-02-21 DIAGNOSIS — G9341 Metabolic encephalopathy: Secondary | ICD-10-CM | POA: Diagnosis not present

## 2021-02-21 DIAGNOSIS — R69 Illness, unspecified: Secondary | ICD-10-CM | POA: Diagnosis not present

## 2021-02-21 DIAGNOSIS — R41841 Cognitive communication deficit: Secondary | ICD-10-CM | POA: Diagnosis not present

## 2021-02-21 DIAGNOSIS — R2689 Other abnormalities of gait and mobility: Secondary | ICD-10-CM | POA: Diagnosis not present

## 2021-02-21 DIAGNOSIS — I10 Essential (primary) hypertension: Secondary | ICD-10-CM | POA: Diagnosis not present

## 2021-02-21 DIAGNOSIS — M6281 Muscle weakness (generalized): Secondary | ICD-10-CM | POA: Diagnosis not present

## 2021-02-21 DIAGNOSIS — S42202D Unspecified fracture of upper end of left humerus, subsequent encounter for fracture with routine healing: Secondary | ICD-10-CM | POA: Diagnosis not present

## 2021-02-23 DIAGNOSIS — S42202A Unspecified fracture of upper end of left humerus, initial encounter for closed fracture: Secondary | ICD-10-CM | POA: Diagnosis not present

## 2021-02-23 DIAGNOSIS — M25512 Pain in left shoulder: Secondary | ICD-10-CM | POA: Diagnosis not present

## 2021-02-23 DIAGNOSIS — M6281 Muscle weakness (generalized): Secondary | ICD-10-CM | POA: Diagnosis not present

## 2021-02-23 DIAGNOSIS — S42202D Unspecified fracture of upper end of left humerus, subsequent encounter for fracture with routine healing: Secondary | ICD-10-CM | POA: Diagnosis not present

## 2021-02-23 DIAGNOSIS — M1612 Unilateral primary osteoarthritis, left hip: Secondary | ICD-10-CM | POA: Diagnosis not present

## 2021-02-23 DIAGNOSIS — R2689 Other abnormalities of gait and mobility: Secondary | ICD-10-CM | POA: Diagnosis not present

## 2021-02-23 DIAGNOSIS — I10 Essential (primary) hypertension: Secondary | ICD-10-CM | POA: Diagnosis not present

## 2021-02-23 DIAGNOSIS — R69 Illness, unspecified: Secondary | ICD-10-CM | POA: Diagnosis not present

## 2021-02-23 DIAGNOSIS — G9341 Metabolic encephalopathy: Secondary | ICD-10-CM | POA: Diagnosis not present

## 2021-02-23 DIAGNOSIS — R41841 Cognitive communication deficit: Secondary | ICD-10-CM | POA: Diagnosis not present

## 2021-02-24 DIAGNOSIS — R41841 Cognitive communication deficit: Secondary | ICD-10-CM | POA: Diagnosis not present

## 2021-02-24 DIAGNOSIS — M6281 Muscle weakness (generalized): Secondary | ICD-10-CM | POA: Diagnosis not present

## 2021-02-24 DIAGNOSIS — I10 Essential (primary) hypertension: Secondary | ICD-10-CM | POA: Diagnosis not present

## 2021-02-24 DIAGNOSIS — R69 Illness, unspecified: Secondary | ICD-10-CM | POA: Diagnosis not present

## 2021-02-24 DIAGNOSIS — R2689 Other abnormalities of gait and mobility: Secondary | ICD-10-CM | POA: Diagnosis not present

## 2021-02-24 DIAGNOSIS — G9341 Metabolic encephalopathy: Secondary | ICD-10-CM | POA: Diagnosis not present

## 2021-02-24 DIAGNOSIS — S42202D Unspecified fracture of upper end of left humerus, subsequent encounter for fracture with routine healing: Secondary | ICD-10-CM | POA: Diagnosis not present

## 2021-02-25 DIAGNOSIS — R41841 Cognitive communication deficit: Secondary | ICD-10-CM | POA: Diagnosis not present

## 2021-02-25 DIAGNOSIS — S42202D Unspecified fracture of upper end of left humerus, subsequent encounter for fracture with routine healing: Secondary | ICD-10-CM | POA: Diagnosis not present

## 2021-02-25 DIAGNOSIS — I10 Essential (primary) hypertension: Secondary | ICD-10-CM | POA: Diagnosis not present

## 2021-02-25 DIAGNOSIS — R2689 Other abnormalities of gait and mobility: Secondary | ICD-10-CM | POA: Diagnosis not present

## 2021-02-25 DIAGNOSIS — M6281 Muscle weakness (generalized): Secondary | ICD-10-CM | POA: Diagnosis not present

## 2021-02-25 DIAGNOSIS — N390 Urinary tract infection, site not specified: Secondary | ICD-10-CM | POA: Diagnosis not present

## 2021-02-25 DIAGNOSIS — R69 Illness, unspecified: Secondary | ICD-10-CM | POA: Diagnosis not present

## 2021-02-25 DIAGNOSIS — G9341 Metabolic encephalopathy: Secondary | ICD-10-CM | POA: Diagnosis not present

## 2021-02-26 DIAGNOSIS — R41841 Cognitive communication deficit: Secondary | ICD-10-CM | POA: Diagnosis not present

## 2021-02-26 DIAGNOSIS — R69 Illness, unspecified: Secondary | ICD-10-CM | POA: Diagnosis not present

## 2021-02-26 DIAGNOSIS — I10 Essential (primary) hypertension: Secondary | ICD-10-CM | POA: Diagnosis not present

## 2021-02-26 DIAGNOSIS — G9341 Metabolic encephalopathy: Secondary | ICD-10-CM | POA: Diagnosis not present

## 2021-02-26 DIAGNOSIS — M6281 Muscle weakness (generalized): Secondary | ICD-10-CM | POA: Diagnosis not present

## 2021-02-26 DIAGNOSIS — S42202D Unspecified fracture of upper end of left humerus, subsequent encounter for fracture with routine healing: Secondary | ICD-10-CM | POA: Diagnosis not present

## 2021-02-26 DIAGNOSIS — R2689 Other abnormalities of gait and mobility: Secondary | ICD-10-CM | POA: Diagnosis not present

## 2021-02-27 DIAGNOSIS — G9341 Metabolic encephalopathy: Secondary | ICD-10-CM | POA: Diagnosis not present

## 2021-02-27 DIAGNOSIS — M6281 Muscle weakness (generalized): Secondary | ICD-10-CM | POA: Diagnosis not present

## 2021-02-27 DIAGNOSIS — S42202D Unspecified fracture of upper end of left humerus, subsequent encounter for fracture with routine healing: Secondary | ICD-10-CM | POA: Diagnosis not present

## 2021-02-27 DIAGNOSIS — R41841 Cognitive communication deficit: Secondary | ICD-10-CM | POA: Diagnosis not present

## 2021-02-27 DIAGNOSIS — R2689 Other abnormalities of gait and mobility: Secondary | ICD-10-CM | POA: Diagnosis not present

## 2021-02-27 DIAGNOSIS — R69 Illness, unspecified: Secondary | ICD-10-CM | POA: Diagnosis not present

## 2021-02-27 DIAGNOSIS — I10 Essential (primary) hypertension: Secondary | ICD-10-CM | POA: Diagnosis not present

## 2021-03-02 DIAGNOSIS — G9341 Metabolic encephalopathy: Secondary | ICD-10-CM | POA: Diagnosis not present

## 2021-03-02 DIAGNOSIS — R41841 Cognitive communication deficit: Secondary | ICD-10-CM | POA: Diagnosis not present

## 2021-03-02 DIAGNOSIS — R69 Illness, unspecified: Secondary | ICD-10-CM | POA: Diagnosis not present

## 2021-03-02 DIAGNOSIS — I10 Essential (primary) hypertension: Secondary | ICD-10-CM | POA: Diagnosis not present

## 2021-03-02 DIAGNOSIS — M6281 Muscle weakness (generalized): Secondary | ICD-10-CM | POA: Diagnosis not present

## 2021-03-02 DIAGNOSIS — S42202D Unspecified fracture of upper end of left humerus, subsequent encounter for fracture with routine healing: Secondary | ICD-10-CM | POA: Diagnosis not present

## 2021-03-02 DIAGNOSIS — R2689 Other abnormalities of gait and mobility: Secondary | ICD-10-CM | POA: Diagnosis not present

## 2021-03-03 DIAGNOSIS — S42202D Unspecified fracture of upper end of left humerus, subsequent encounter for fracture with routine healing: Secondary | ICD-10-CM | POA: Diagnosis not present

## 2021-03-03 DIAGNOSIS — G9341 Metabolic encephalopathy: Secondary | ICD-10-CM | POA: Diagnosis not present

## 2021-03-03 DIAGNOSIS — I10 Essential (primary) hypertension: Secondary | ICD-10-CM | POA: Diagnosis not present

## 2021-03-03 DIAGNOSIS — M6281 Muscle weakness (generalized): Secondary | ICD-10-CM | POA: Diagnosis not present

## 2021-03-03 DIAGNOSIS — R41841 Cognitive communication deficit: Secondary | ICD-10-CM | POA: Diagnosis not present

## 2021-03-03 DIAGNOSIS — R69 Illness, unspecified: Secondary | ICD-10-CM | POA: Diagnosis not present

## 2021-03-03 DIAGNOSIS — R2689 Other abnormalities of gait and mobility: Secondary | ICD-10-CM | POA: Diagnosis not present

## 2021-03-04 DIAGNOSIS — R2689 Other abnormalities of gait and mobility: Secondary | ICD-10-CM | POA: Diagnosis not present

## 2021-03-04 DIAGNOSIS — R69 Illness, unspecified: Secondary | ICD-10-CM | POA: Diagnosis not present

## 2021-03-04 DIAGNOSIS — R41841 Cognitive communication deficit: Secondary | ICD-10-CM | POA: Diagnosis not present

## 2021-03-04 DIAGNOSIS — G9341 Metabolic encephalopathy: Secondary | ICD-10-CM | POA: Diagnosis not present

## 2021-03-04 DIAGNOSIS — M6281 Muscle weakness (generalized): Secondary | ICD-10-CM | POA: Diagnosis not present

## 2021-03-04 DIAGNOSIS — S42202D Unspecified fracture of upper end of left humerus, subsequent encounter for fracture with routine healing: Secondary | ICD-10-CM | POA: Diagnosis not present

## 2021-03-04 DIAGNOSIS — I10 Essential (primary) hypertension: Secondary | ICD-10-CM | POA: Diagnosis not present

## 2021-03-05 DIAGNOSIS — I10 Essential (primary) hypertension: Secondary | ICD-10-CM | POA: Diagnosis not present

## 2021-03-05 DIAGNOSIS — M6281 Muscle weakness (generalized): Secondary | ICD-10-CM | POA: Diagnosis not present

## 2021-03-05 DIAGNOSIS — R69 Illness, unspecified: Secondary | ICD-10-CM | POA: Diagnosis not present

## 2021-03-05 DIAGNOSIS — G9341 Metabolic encephalopathy: Secondary | ICD-10-CM | POA: Diagnosis not present

## 2021-03-05 DIAGNOSIS — S42202D Unspecified fracture of upper end of left humerus, subsequent encounter for fracture with routine healing: Secondary | ICD-10-CM | POA: Diagnosis not present

## 2021-03-05 DIAGNOSIS — R41841 Cognitive communication deficit: Secondary | ICD-10-CM | POA: Diagnosis not present

## 2021-03-05 DIAGNOSIS — R2689 Other abnormalities of gait and mobility: Secondary | ICD-10-CM | POA: Diagnosis not present

## 2021-03-06 DIAGNOSIS — M6281 Muscle weakness (generalized): Secondary | ICD-10-CM | POA: Diagnosis not present

## 2021-03-06 DIAGNOSIS — R69 Illness, unspecified: Secondary | ICD-10-CM | POA: Diagnosis not present

## 2021-03-06 DIAGNOSIS — R41841 Cognitive communication deficit: Secondary | ICD-10-CM | POA: Diagnosis not present

## 2021-03-06 DIAGNOSIS — G9341 Metabolic encephalopathy: Secondary | ICD-10-CM | POA: Diagnosis not present

## 2021-03-06 DIAGNOSIS — I10 Essential (primary) hypertension: Secondary | ICD-10-CM | POA: Diagnosis not present

## 2021-03-06 DIAGNOSIS — R2689 Other abnormalities of gait and mobility: Secondary | ICD-10-CM | POA: Diagnosis not present

## 2021-03-06 DIAGNOSIS — S42202D Unspecified fracture of upper end of left humerus, subsequent encounter for fracture with routine healing: Secondary | ICD-10-CM | POA: Diagnosis not present

## 2021-03-09 DIAGNOSIS — I10 Essential (primary) hypertension: Secondary | ICD-10-CM | POA: Diagnosis not present

## 2021-03-09 DIAGNOSIS — S42202D Unspecified fracture of upper end of left humerus, subsequent encounter for fracture with routine healing: Secondary | ICD-10-CM | POA: Diagnosis not present

## 2021-03-09 DIAGNOSIS — M6281 Muscle weakness (generalized): Secondary | ICD-10-CM | POA: Diagnosis not present

## 2021-03-09 DIAGNOSIS — R41841 Cognitive communication deficit: Secondary | ICD-10-CM | POA: Diagnosis not present

## 2021-03-09 DIAGNOSIS — R69 Illness, unspecified: Secondary | ICD-10-CM | POA: Diagnosis not present

## 2021-03-09 DIAGNOSIS — G9341 Metabolic encephalopathy: Secondary | ICD-10-CM | POA: Diagnosis not present

## 2021-03-09 DIAGNOSIS — R2689 Other abnormalities of gait and mobility: Secondary | ICD-10-CM | POA: Diagnosis not present

## 2021-03-10 DIAGNOSIS — M6281 Muscle weakness (generalized): Secondary | ICD-10-CM | POA: Diagnosis not present

## 2021-03-10 DIAGNOSIS — R69 Illness, unspecified: Secondary | ICD-10-CM | POA: Diagnosis not present

## 2021-03-10 DIAGNOSIS — R2689 Other abnormalities of gait and mobility: Secondary | ICD-10-CM | POA: Diagnosis not present

## 2021-03-10 DIAGNOSIS — G9341 Metabolic encephalopathy: Secondary | ICD-10-CM | POA: Diagnosis not present

## 2021-03-10 DIAGNOSIS — R41841 Cognitive communication deficit: Secondary | ICD-10-CM | POA: Diagnosis not present

## 2021-03-10 DIAGNOSIS — I10 Essential (primary) hypertension: Secondary | ICD-10-CM | POA: Diagnosis not present

## 2021-03-10 DIAGNOSIS — S42202D Unspecified fracture of upper end of left humerus, subsequent encounter for fracture with routine healing: Secondary | ICD-10-CM | POA: Diagnosis not present

## 2021-03-11 DIAGNOSIS — M6281 Muscle weakness (generalized): Secondary | ICD-10-CM | POA: Diagnosis not present

## 2021-03-11 DIAGNOSIS — S42202D Unspecified fracture of upper end of left humerus, subsequent encounter for fracture with routine healing: Secondary | ICD-10-CM | POA: Diagnosis not present

## 2021-03-11 DIAGNOSIS — G9341 Metabolic encephalopathy: Secondary | ICD-10-CM | POA: Diagnosis not present

## 2021-03-11 DIAGNOSIS — I10 Essential (primary) hypertension: Secondary | ICD-10-CM | POA: Diagnosis not present

## 2021-03-11 DIAGNOSIS — R69 Illness, unspecified: Secondary | ICD-10-CM | POA: Diagnosis not present

## 2021-03-11 DIAGNOSIS — R2689 Other abnormalities of gait and mobility: Secondary | ICD-10-CM | POA: Diagnosis not present

## 2021-03-11 DIAGNOSIS — R41841 Cognitive communication deficit: Secondary | ICD-10-CM | POA: Diagnosis not present

## 2021-03-12 DIAGNOSIS — R2689 Other abnormalities of gait and mobility: Secondary | ICD-10-CM | POA: Diagnosis not present

## 2021-03-12 DIAGNOSIS — R41841 Cognitive communication deficit: Secondary | ICD-10-CM | POA: Diagnosis not present

## 2021-03-12 DIAGNOSIS — I10 Essential (primary) hypertension: Secondary | ICD-10-CM | POA: Diagnosis not present

## 2021-03-12 DIAGNOSIS — S42202D Unspecified fracture of upper end of left humerus, subsequent encounter for fracture with routine healing: Secondary | ICD-10-CM | POA: Diagnosis not present

## 2021-03-12 DIAGNOSIS — R69 Illness, unspecified: Secondary | ICD-10-CM | POA: Diagnosis not present

## 2021-03-12 DIAGNOSIS — M6281 Muscle weakness (generalized): Secondary | ICD-10-CM | POA: Diagnosis not present

## 2021-03-12 DIAGNOSIS — G9341 Metabolic encephalopathy: Secondary | ICD-10-CM | POA: Diagnosis not present

## 2021-03-13 DIAGNOSIS — G9341 Metabolic encephalopathy: Secondary | ICD-10-CM | POA: Diagnosis not present

## 2021-03-13 DIAGNOSIS — R69 Illness, unspecified: Secondary | ICD-10-CM | POA: Diagnosis not present

## 2021-03-13 DIAGNOSIS — S42202D Unspecified fracture of upper end of left humerus, subsequent encounter for fracture with routine healing: Secondary | ICD-10-CM | POA: Diagnosis not present

## 2021-03-13 DIAGNOSIS — R41841 Cognitive communication deficit: Secondary | ICD-10-CM | POA: Diagnosis not present

## 2021-03-13 DIAGNOSIS — R2689 Other abnormalities of gait and mobility: Secondary | ICD-10-CM | POA: Diagnosis not present

## 2021-03-13 DIAGNOSIS — I10 Essential (primary) hypertension: Secondary | ICD-10-CM | POA: Diagnosis not present

## 2021-03-13 DIAGNOSIS — M6281 Muscle weakness (generalized): Secondary | ICD-10-CM | POA: Diagnosis not present

## 2021-03-16 DIAGNOSIS — R69 Illness, unspecified: Secondary | ICD-10-CM | POA: Diagnosis not present

## 2021-03-16 DIAGNOSIS — R2689 Other abnormalities of gait and mobility: Secondary | ICD-10-CM | POA: Diagnosis not present

## 2021-03-16 DIAGNOSIS — G9341 Metabolic encephalopathy: Secondary | ICD-10-CM | POA: Diagnosis not present

## 2021-03-16 DIAGNOSIS — M6281 Muscle weakness (generalized): Secondary | ICD-10-CM | POA: Diagnosis not present

## 2021-03-16 DIAGNOSIS — R41841 Cognitive communication deficit: Secondary | ICD-10-CM | POA: Diagnosis not present

## 2021-03-16 DIAGNOSIS — S42202D Unspecified fracture of upper end of left humerus, subsequent encounter for fracture with routine healing: Secondary | ICD-10-CM | POA: Diagnosis not present

## 2021-03-16 DIAGNOSIS — I10 Essential (primary) hypertension: Secondary | ICD-10-CM | POA: Diagnosis not present

## 2021-03-17 DIAGNOSIS — S42202D Unspecified fracture of upper end of left humerus, subsequent encounter for fracture with routine healing: Secondary | ICD-10-CM | POA: Diagnosis not present

## 2021-03-17 DIAGNOSIS — I10 Essential (primary) hypertension: Secondary | ICD-10-CM | POA: Diagnosis not present

## 2021-03-17 DIAGNOSIS — R69 Illness, unspecified: Secondary | ICD-10-CM | POA: Diagnosis not present

## 2021-03-17 DIAGNOSIS — R2689 Other abnormalities of gait and mobility: Secondary | ICD-10-CM | POA: Diagnosis not present

## 2021-03-17 DIAGNOSIS — R41841 Cognitive communication deficit: Secondary | ICD-10-CM | POA: Diagnosis not present

## 2021-03-17 DIAGNOSIS — M6281 Muscle weakness (generalized): Secondary | ICD-10-CM | POA: Diagnosis not present

## 2021-03-17 DIAGNOSIS — G9341 Metabolic encephalopathy: Secondary | ICD-10-CM | POA: Diagnosis not present

## 2021-03-18 DIAGNOSIS — R2689 Other abnormalities of gait and mobility: Secondary | ICD-10-CM | POA: Diagnosis not present

## 2021-03-18 DIAGNOSIS — M25512 Pain in left shoulder: Secondary | ICD-10-CM | POA: Diagnosis not present

## 2021-03-18 DIAGNOSIS — G9341 Metabolic encephalopathy: Secondary | ICD-10-CM | POA: Diagnosis not present

## 2021-03-18 DIAGNOSIS — S42202D Unspecified fracture of upper end of left humerus, subsequent encounter for fracture with routine healing: Secondary | ICD-10-CM | POA: Diagnosis not present

## 2021-03-18 DIAGNOSIS — R69 Illness, unspecified: Secondary | ICD-10-CM | POA: Diagnosis not present

## 2021-03-18 DIAGNOSIS — I10 Essential (primary) hypertension: Secondary | ICD-10-CM | POA: Diagnosis not present

## 2021-03-18 DIAGNOSIS — M6281 Muscle weakness (generalized): Secondary | ICD-10-CM | POA: Diagnosis not present

## 2021-03-18 DIAGNOSIS — Z9889 Other specified postprocedural states: Secondary | ICD-10-CM | POA: Diagnosis not present

## 2021-03-18 DIAGNOSIS — R41841 Cognitive communication deficit: Secondary | ICD-10-CM | POA: Diagnosis not present

## 2021-03-19 DIAGNOSIS — I10 Essential (primary) hypertension: Secondary | ICD-10-CM | POA: Diagnosis not present

## 2021-03-19 DIAGNOSIS — R2689 Other abnormalities of gait and mobility: Secondary | ICD-10-CM | POA: Diagnosis not present

## 2021-03-19 DIAGNOSIS — R41841 Cognitive communication deficit: Secondary | ICD-10-CM | POA: Diagnosis not present

## 2021-03-19 DIAGNOSIS — R69 Illness, unspecified: Secondary | ICD-10-CM | POA: Diagnosis not present

## 2021-03-19 DIAGNOSIS — S42202D Unspecified fracture of upper end of left humerus, subsequent encounter for fracture with routine healing: Secondary | ICD-10-CM | POA: Diagnosis not present

## 2021-03-19 DIAGNOSIS — G9341 Metabolic encephalopathy: Secondary | ICD-10-CM | POA: Diagnosis not present

## 2021-03-19 DIAGNOSIS — M6281 Muscle weakness (generalized): Secondary | ICD-10-CM | POA: Diagnosis not present

## 2021-03-20 DIAGNOSIS — R2689 Other abnormalities of gait and mobility: Secondary | ICD-10-CM | POA: Diagnosis not present

## 2021-03-20 DIAGNOSIS — S42202D Unspecified fracture of upper end of left humerus, subsequent encounter for fracture with routine healing: Secondary | ICD-10-CM | POA: Diagnosis not present

## 2021-03-20 DIAGNOSIS — M6281 Muscle weakness (generalized): Secondary | ICD-10-CM | POA: Diagnosis not present

## 2021-03-20 DIAGNOSIS — I10 Essential (primary) hypertension: Secondary | ICD-10-CM | POA: Diagnosis not present

## 2021-03-20 DIAGNOSIS — R69 Illness, unspecified: Secondary | ICD-10-CM | POA: Diagnosis not present

## 2021-03-20 DIAGNOSIS — R41841 Cognitive communication deficit: Secondary | ICD-10-CM | POA: Diagnosis not present

## 2021-03-20 DIAGNOSIS — G9341 Metabolic encephalopathy: Secondary | ICD-10-CM | POA: Diagnosis not present

## 2021-03-21 NOTE — Progress Notes (Unsigned)
Cardiology Office Note:    Date:  03/21/2021   ID:  [name], DOB August 15, 1927, MRN [id number]  PCP:  [name], MD   [facility] Providers Cardiologist:  [name], MD Referring MD: [name],*   Chief Complaint/Reason for Referral: Follow-up  ASSESSMENT:    Chronic diastolic CHF (congestive heart failure), NYHA class 2 ([place])  Paroxysmal atrial fibrillation [facility])  Pacemaker  Essential hypertension    PLAN:    In order of problems listed above:  1.  Start Jardiance 10 mg daily  2.  No evidence of atrial fibrillation on last pacemaker interrogation.  Given advanced age and multiple falls she is not a candidate for anticoagulation.  I do not think she is a candidate for left atrial appendage closure.  3.  This is being followed by the EP division.  4.  Hypertension           {Are you ordering a CV Procedure (e.g. stress test, cath, DCCV, TEE, etc)?   Press F2        :[id number]}   Dispo:  No follow-ups on file.     Medication Adjustments/Labs and Tests Ordered: Current medicines are reviewed at length with the patient today.  Concerns regarding medicines are outlined above.   Tests Ordered: No orders of the defined types were placed in this encounter.   Medication Changes: No orders of the defined types were placed in this encounter.   History of Present Illness:    FOCUSED CARDIOVASCULAR PROBLEM LIST:   1.  Heart failure with preserved ejection fraction 2.  Sinus node dysfunction status post permanent pacemaker 3.  Paroxysmal atrial fibrillation; not on anticoagulation due to recurrent falls  4.  Hypertension 5.  Hyperlipidemia 6.  Chronic kidney disease 7.  COPD   The patient is a 88 y.o. female with the indicated medical history here for cardiology follow-up.        Previous Medical History: Past Medical History:  Diagnosis Date   Anemia    "I stay anemic"   Arthritis    Asthma    no problems  recent   Cataract    Chronic diastolic CHF (congestive heart failure), NYHA class 2 ([place]) 03/19/2014   Echo 4/19: mod LVH, EF 55-60, no RWMA, Gr 1 DD, MAC, trivial MR, mod LAE, normal RVSF, PASP 19   CKD (chronic kidney disease), stage III (HCC)    Complication of anesthesia    CONFUSED AFTER KNEE SURGERY   Depression    Diverticulitis    DVT (deep venous thrombosis) (HCC)    from birth control, left groin   Essential hypertension    Fibromyalgia 10 y.a.   GERD (gastroesophageal reflux disease)    Gout    Hx of cardiovascular stress test    Lexiscan Myoview (8/15): apical attenuation artifact, no ischemia, EF 69% - low risk.   Hypercholesteremia    Odynophagia    Other abnormal glucose    PAF (paroxysmal atrial fibrillation) (HCC)    Personal history of other diseases of digestive system    Pneumonia    hx of   Presence of permanent cardiac pacemaker    Sinus bradycardia    Skin cancer 2014   skin R elbow & face   Symptomatic bradycardia 06/2016     Current Medications: No outpatient medications have been marked as taking for the 03/23/21 encounter (Appointment) with Early Osmond, MD.     Allergies:    Amoxicillin, Diltiazem hcl, Losartan potassium, Potassium-containing  compounds, Sulfonamide derivatives, Zetia [ezetimibe], and Penicillins   Social History:   Social History   Tobacco Use   Smoking status: Former    Packs/day: 1.00    Years: 10.00    Pack years: 10.00    Types: Cigarettes    Quit date: 02/08/1973    Years since quitting: 48.1   Smokeless tobacco: Never  Vaping Use   Vaping Use: Never used  Substance Use Topics   Alcohol use: No   Drug use: No     Family Hx: Family History  Problem Relation Age of Onset   Heart disease Mother    Hypertension Mother    Fainting Mother    Arrhythmia Mother    Diabetes Father    Stroke Father    Hypertension Father    Cancer Brother    Sudden death Brother    Heart attack Brother    Cancer Sister     Diabetes Sister    Asthma Sister      Review of Systems:   Please see the history of present illness.    All other systems reviewed and are negative.     EKGs/Labs/Other Test Reviewed:    EKG: Sinus rhythm with left anterior fascicular block  Prior CV studies:  PPM interrogation 11/22  3 NSVT, 3 ATR.  Al < 30 sec duration. EGM's show brief 1:1 @ 170's bpm.   TTE 2021 1. Left ventricular ejection fraction, by estimation, is 65 to 70%. The  left ventricle has normal function. The left ventricle has no regional  wall motion abnormalities. There is moderate left ventricular hypertrophy.  Left ventricular diastolic  parameters are consistent with Grade I diastolic dysfunction (impaired  relaxation).   2. Right ventricular systolic function is normal. The right ventricular  size is normal. There is mildly elevated pulmonary artery systolic  pressure. The estimated right ventricular systolic pressure is 14.4 mmHg.   3. Left atrial size was mild to moderately dilated.   4. The mitral valve is normal in structure. Trivial mitral valve  regurgitation. No evidence of mitral stenosis.   5. The aortic valve is normal in structure. Aortic valve regurgitation is  not visualized. No aortic stenosis is present.   6. The inferior vena cava is dilated in size with >50% respiratory  variability, suggesting right atrial pressure of 8 mmHg.   Imaging studies that I have independently reviewed today: Echocardiogram and pacemaker interrogation  Recent Labs: 11/21/2020: ALT 11 11/29/2020: Magnesium 2.0 01/05/2021: BUN 22; Creatinine, Ser 1.10; Hemoglobin 9.8; Platelets 122; Potassium 4.2; Sodium 142   Recent Lipid Panel Lab Results  Component Value Date/Time   CHOL  06/07/2008 04:05 AM    118        ATP III CLASSIFICATION:  <200     mg/dL   Desirable  200-239  mg/dL   Borderline High  >=240    mg/dL   High          TRIG 85 06/07/2008 04:05 AM   HDL 35 (L) 06/07/2008 04:05 AM   LDLCALC   06/07/2008 04:05 AM    66        Total Cholesterol/HDL:CHD Risk Coronary Heart Disease Risk Table                     Men   Women  1/2 Average Risk   3.4   3.3  Average Risk       5.0   4.4  2 X Average Risk  9.6   7.1  3 X Average Risk  23.4   11.0        Use the calculated Patient Ratio above and the CHD Risk Table to determine the patient's CHD Risk.        ATP III CLASSIFICATION (LDL):  <100     mg/dL   Optimal  100-129  mg/dL   Near or Above                    Optimal  130-159  mg/dL   Borderline  160-189  mg/dL   High  >190     mg/dL   Very High    Risk Assessment/Calculations:    {Does this patient have ATRIAL FIBRILLATION?[phone]}      Physical Exam:    VS:  There were no vitals taken for this visit.   Wt Readings from Last 3 Encounters:  01/05/21 160 lb (72.6 kg)  11/26/20 165 lb 9.1 oz (75.1 kg)  07/31/19 155 lb 9.6 oz (70.6 kg)    GENERAL:  No apparent distress, AOx3 HEENT:  No carotid bruits, +2 carotid impulses, no scleral icterus CAR: RRR Irregular RR*** no murmurs***, gallops, rubs, or thrills RES:  Clear to auscultation bilaterally ABD:  Soft, nontender, nondistended, positive bowel sounds x 4 VASC:  +2 radial pulses, +2 carotid pulses, palpable pedal pulses NEURO:  CN 2-12 grossly intact; motor and sensory grossly intact PSYCH:  No active depression or anxiety EXT:  No edema, ecchymosis, or cyanosis  Signed, Early Osmond, MD  03/21/2021 7:53 AM    [facility], [place], [place]  [address] Phone: [phone]; Fax: [phone]   Note:  This document was prepared using Dragon voice recognition software and may include unintentional dictation errors.

## 2021-03-23 ENCOUNTER — Ambulatory Visit: Payer: Medicare HMO | Admitting: Internal Medicine

## 2021-03-23 DIAGNOSIS — S42202D Unspecified fracture of upper end of left humerus, subsequent encounter for fracture with routine healing: Secondary | ICD-10-CM | POA: Diagnosis not present

## 2021-03-23 DIAGNOSIS — Z950 Presence of cardiac pacemaker: Secondary | ICD-10-CM

## 2021-03-23 DIAGNOSIS — I5032 Chronic diastolic (congestive) heart failure: Secondary | ICD-10-CM

## 2021-03-23 DIAGNOSIS — M6281 Muscle weakness (generalized): Secondary | ICD-10-CM | POA: Diagnosis not present

## 2021-03-23 DIAGNOSIS — R69 Illness, unspecified: Secondary | ICD-10-CM | POA: Diagnosis not present

## 2021-03-23 DIAGNOSIS — G9341 Metabolic encephalopathy: Secondary | ICD-10-CM | POA: Diagnosis not present

## 2021-03-23 DIAGNOSIS — R2689 Other abnormalities of gait and mobility: Secondary | ICD-10-CM | POA: Diagnosis not present

## 2021-03-23 DIAGNOSIS — R41841 Cognitive communication deficit: Secondary | ICD-10-CM | POA: Diagnosis not present

## 2021-03-23 DIAGNOSIS — I10 Essential (primary) hypertension: Secondary | ICD-10-CM | POA: Diagnosis not present

## 2021-03-23 DIAGNOSIS — I480 Paroxysmal atrial fibrillation: Secondary | ICD-10-CM

## 2021-03-23 NOTE — Progress Notes (Unsigned)
Cardiology Office Note:    Date:  03/23/2021   ID:  [name], DOB 03/11/1927, MRN [id number]  PCP:  [name], MD   [facility] Providers Cardiologist:  [name], MD Referring MD: [name],*   Chief Complaint/Reason for Referral: Cardiology follow-up  ASSESSMENT:    Acute on chronic diastolic heart failure ([place])  Pacemaker  Hyperlipidemia, unspecified hyperlipidemia type  Essential hypertension    PLAN:    In order of problems listed above:  1.  Start Jardiance 10 mg daily   2.  No evidence of atrial fibrillation on last pacemaker interrogation.  Given advanced age and multiple falls she is not a candidate for anticoagulation.  I do not think she is a candidate for left atrial appendage closure.   3.  This is being followed by the EP division.   4.  Hypertension           {Are you ordering a CV Procedure (e.g. stress test, cath, DCCV, TEE, etc)?   Press F2        :[id number]}   Dispo:  No follow-ups on file.     Medication Adjustments/Labs and Tests Ordered: Current medicines are reviewed at length with the patient today.  Concerns regarding medicines are outlined above.   Tests Ordered: No orders of the defined types were placed in this encounter.   Medication Changes: No orders of the defined types were placed in this encounter.   History of Present Illness:    FOCUSED CARDIOVASCULAR PROBLEM LIST:   1.  Heart failure with preserved ejection fraction  2.  Sinus node dysfunction status post permanent pacemaker  3.  Paroxysmal atrial fibrillation; not on anticoagulation due to recurrent falls   4.  Hypertension  5.  Hyperlipidemia  6.  Chronic kidney disease  7.  COPD   The patient is a 88 y.o. female with the indicated medical history here for cardiology follow-up.  She was last seen in 2021 and was started on Lasix.        Previous Medical History: Past Medical History:  Diagnosis Date   Anemia     "I stay anemic"   Arthritis    Asthma    no problems recent   Cataract    Chronic diastolic CHF (congestive heart failure), NYHA class 2 ([place]) 03/19/2014   Echo 4/19: mod LVH, EF 55-60, no RWMA, Gr 1 DD, MAC, trivial MR, mod LAE, normal RVSF, PASP 19   CKD (chronic kidney disease), stage III (HCC)    Complication of anesthesia    CONFUSED AFTER KNEE SURGERY   Depression    Diverticulitis    DVT (deep venous thrombosis) (HCC)    from birth control, left groin   Essential hypertension    Fibromyalgia 10 y.a.   GERD (gastroesophageal reflux disease)    Gout    Hx of cardiovascular stress test    Lexiscan Myoview (8/15): apical attenuation artifact, no ischemia, EF 69% - low risk.   Hypercholesteremia    Odynophagia    Other abnormal glucose    PAF (paroxysmal atrial fibrillation) (HCC)    Personal history of other diseases of digestive system    Pneumonia    hx of   Presence of permanent cardiac pacemaker    Sinus bradycardia    Skin cancer 2014   skin R elbow & face   Symptomatic bradycardia 06/2016     Current Medications: No outpatient medications have been marked as taking for the 03/24/21 encounter (Appointment)  with Early Osmond, MD.     Allergies:    Amoxicillin, Diltiazem hcl, Losartan potassium, Potassium-containing compounds, Sulfonamide derivatives, Zetia [ezetimibe], and Penicillins   Social History:   Social History   Tobacco Use   Smoking status: Former    Packs/day: 1.00    Years: 10.00    Pack years: 10.00    Types: Cigarettes    Quit date: 02/08/1973    Years since quitting: 48.1   Smokeless tobacco: Never  Vaping Use   Vaping Use: Never used  Substance Use Topics   Alcohol use: No   Drug use: No     Family Hx: Family History  Problem Relation Age of Onset   Heart disease Mother    Hypertension Mother    Fainting Mother    Arrhythmia Mother    Diabetes Father    Stroke Father    Hypertension Father    Cancer Brother    Sudden  death Brother    Heart attack Brother    Cancer Sister    Diabetes Sister    Asthma Sister      Review of Systems:   Please see the history of present illness.    All other systems reviewed and are negative.     EKGs/Labs/Other Test Reviewed:    EKG:  ***  Prior CV studies: *** {Select studies to display:[id number]}   Imaging studies that I have independently reviewed today: ***  Recent Labs: 11/21/2020: ALT 11 11/29/2020: Magnesium 2.0 01/05/2021: BUN 22; Creatinine, Ser 1.10; Hemoglobin 9.8; Platelets 122; Potassium 4.2; Sodium 142   Recent Lipid Panel Lab Results  Component Value Date/Time   CHOL  06/07/2008 04:05 AM    118        ATP III CLASSIFICATION:  <200     mg/dL   Desirable  200-239  mg/dL   Borderline High  >=240    mg/dL   High          TRIG 85 06/07/2008 04:05 AM   HDL 35 (L) 06/07/2008 04:05 AM   LDLCALC  06/07/2008 04:05 AM    66        Total Cholesterol/HDL:CHD Risk Coronary Heart Disease Risk Table                     Men   Women  1/2 Average Risk   3.4   3.3  Average Risk       5.0   4.4  2 X Average Risk   9.6   7.1  3 X Average Risk  23.4   11.0        Use the calculated Patient Ratio above and the CHD Risk Table to determine the patient's CHD Risk.        ATP III CLASSIFICATION (LDL):  <100     mg/dL   Optimal  100-129  mg/dL   Near or Above                    Optimal  130-159  mg/dL   Borderline  160-189  mg/dL   High  >190     mg/dL   Very High    Risk Assessment/Calculations:    {Does this patient have ATRIAL FIBRILLATION?[phone]}      Physical Exam:    VS:  There were no vitals taken for this visit.   Wt Readings from Last 3 Encounters:  01/05/21 160 lb (72.6 kg)  11/26/20 165 lb 9.1 oz (75.1 kg)  07/31/19 155 lb 9.6 oz (70.6 kg)    GENERAL:  No apparent distress, AOx3 HEENT:  No carotid bruits, +2 carotid impulses, no scleral icterus CAR: RRR Irregular RR*** no murmurs***, gallops, rubs, or thrills RES:  Clear to  auscultation bilaterally ABD:  Soft, nontender, nondistended, positive bowel sounds x 4 VASC:  +2 radial pulses, +2 carotid pulses, palpable pedal pulses NEURO:  CN 2-12 grossly intact; motor and sensory grossly intact PSYCH:  No active depression or anxiety EXT:  No edema, ecchymosis, or cyanosis  Signed, Early Osmond, MD  03/23/2021 3:49 PM    [facility], [place], [place]  [address] Phone: [phone]; Fax: [phone]   Note:  This document was prepared using Dragon voice recognition software and may include unintentional dictation errors.

## 2021-03-24 ENCOUNTER — Ambulatory Visit: Payer: Medicare HMO | Admitting: Internal Medicine

## 2021-03-24 DIAGNOSIS — R2689 Other abnormalities of gait and mobility: Secondary | ICD-10-CM | POA: Diagnosis not present

## 2021-03-24 DIAGNOSIS — E785 Hyperlipidemia, unspecified: Secondary | ICD-10-CM

## 2021-03-24 DIAGNOSIS — I10 Essential (primary) hypertension: Secondary | ICD-10-CM

## 2021-03-24 DIAGNOSIS — R69 Illness, unspecified: Secondary | ICD-10-CM | POA: Diagnosis not present

## 2021-03-24 DIAGNOSIS — Z950 Presence of cardiac pacemaker: Secondary | ICD-10-CM

## 2021-03-24 DIAGNOSIS — S42202D Unspecified fracture of upper end of left humerus, subsequent encounter for fracture with routine healing: Secondary | ICD-10-CM | POA: Diagnosis not present

## 2021-03-24 DIAGNOSIS — R41841 Cognitive communication deficit: Secondary | ICD-10-CM | POA: Diagnosis not present

## 2021-03-24 DIAGNOSIS — M6281 Muscle weakness (generalized): Secondary | ICD-10-CM | POA: Diagnosis not present

## 2021-03-24 DIAGNOSIS — I5033 Acute on chronic diastolic (congestive) heart failure: Secondary | ICD-10-CM

## 2021-03-24 DIAGNOSIS — G9341 Metabolic encephalopathy: Secondary | ICD-10-CM | POA: Diagnosis not present

## 2021-03-25 DIAGNOSIS — R69 Illness, unspecified: Secondary | ICD-10-CM | POA: Diagnosis not present

## 2021-03-25 DIAGNOSIS — S42202D Unspecified fracture of upper end of left humerus, subsequent encounter for fracture with routine healing: Secondary | ICD-10-CM | POA: Diagnosis not present

## 2021-03-25 DIAGNOSIS — M6281 Muscle weakness (generalized): Secondary | ICD-10-CM | POA: Diagnosis not present

## 2021-03-25 DIAGNOSIS — G9341 Metabolic encephalopathy: Secondary | ICD-10-CM | POA: Diagnosis not present

## 2021-03-25 DIAGNOSIS — R2689 Other abnormalities of gait and mobility: Secondary | ICD-10-CM | POA: Diagnosis not present

## 2021-03-25 DIAGNOSIS — R41841 Cognitive communication deficit: Secondary | ICD-10-CM | POA: Diagnosis not present

## 2021-03-25 DIAGNOSIS — I10 Essential (primary) hypertension: Secondary | ICD-10-CM | POA: Diagnosis not present

## 2021-03-26 DIAGNOSIS — R2689 Other abnormalities of gait and mobility: Secondary | ICD-10-CM | POA: Diagnosis not present

## 2021-03-26 DIAGNOSIS — S42202D Unspecified fracture of upper end of left humerus, subsequent encounter for fracture with routine healing: Secondary | ICD-10-CM | POA: Diagnosis not present

## 2021-03-26 DIAGNOSIS — G9341 Metabolic encephalopathy: Secondary | ICD-10-CM | POA: Diagnosis not present

## 2021-03-26 DIAGNOSIS — R41841 Cognitive communication deficit: Secondary | ICD-10-CM | POA: Diagnosis not present

## 2021-03-26 DIAGNOSIS — R69 Illness, unspecified: Secondary | ICD-10-CM | POA: Diagnosis not present

## 2021-03-26 DIAGNOSIS — I10 Essential (primary) hypertension: Secondary | ICD-10-CM | POA: Diagnosis not present

## 2021-03-26 DIAGNOSIS — M6281 Muscle weakness (generalized): Secondary | ICD-10-CM | POA: Diagnosis not present

## 2021-03-27 DIAGNOSIS — R41841 Cognitive communication deficit: Secondary | ICD-10-CM | POA: Diagnosis not present

## 2021-03-27 DIAGNOSIS — M6281 Muscle weakness (generalized): Secondary | ICD-10-CM | POA: Diagnosis not present

## 2021-03-27 DIAGNOSIS — R2689 Other abnormalities of gait and mobility: Secondary | ICD-10-CM | POA: Diagnosis not present

## 2021-03-27 DIAGNOSIS — G9341 Metabolic encephalopathy: Secondary | ICD-10-CM | POA: Diagnosis not present

## 2021-03-27 DIAGNOSIS — S42202D Unspecified fracture of upper end of left humerus, subsequent encounter for fracture with routine healing: Secondary | ICD-10-CM | POA: Diagnosis not present

## 2021-03-27 DIAGNOSIS — R69 Illness, unspecified: Secondary | ICD-10-CM | POA: Diagnosis not present

## 2021-03-27 DIAGNOSIS — I10 Essential (primary) hypertension: Secondary | ICD-10-CM | POA: Diagnosis not present

## 2021-03-30 DIAGNOSIS — R630 Anorexia: Secondary | ICD-10-CM | POA: Diagnosis not present

## 2021-03-30 DIAGNOSIS — R197 Diarrhea, unspecified: Secondary | ICD-10-CM | POA: Diagnosis not present

## 2021-03-30 DIAGNOSIS — R112 Nausea with vomiting, unspecified: Secondary | ICD-10-CM | POA: Diagnosis not present

## 2021-03-30 DIAGNOSIS — R109 Unspecified abdominal pain: Secondary | ICD-10-CM | POA: Diagnosis not present

## 2021-03-30 DIAGNOSIS — E860 Dehydration: Secondary | ICD-10-CM | POA: Diagnosis not present

## 2021-03-31 DIAGNOSIS — K591 Functional diarrhea: Secondary | ICD-10-CM | POA: Diagnosis not present

## 2021-03-31 DIAGNOSIS — R197 Diarrhea, unspecified: Secondary | ICD-10-CM | POA: Diagnosis not present

## 2021-03-31 DIAGNOSIS — K219 Gastro-esophageal reflux disease without esophagitis: Secondary | ICD-10-CM | POA: Diagnosis not present

## 2021-03-31 DIAGNOSIS — R41841 Cognitive communication deficit: Secondary | ICD-10-CM | POA: Diagnosis not present

## 2021-03-31 DIAGNOSIS — R140 Abdominal distension (gaseous): Secondary | ICD-10-CM | POA: Diagnosis not present

## 2021-03-31 DIAGNOSIS — I10 Essential (primary) hypertension: Secondary | ICD-10-CM | POA: Diagnosis not present

## 2021-03-31 DIAGNOSIS — M6281 Muscle weakness (generalized): Secondary | ICD-10-CM | POA: Diagnosis not present

## 2021-03-31 DIAGNOSIS — S42202D Unspecified fracture of upper end of left humerus, subsequent encounter for fracture with routine healing: Secondary | ICD-10-CM | POA: Diagnosis not present

## 2021-03-31 DIAGNOSIS — R2689 Other abnormalities of gait and mobility: Secondary | ICD-10-CM | POA: Diagnosis not present

## 2021-03-31 DIAGNOSIS — G9341 Metabolic encephalopathy: Secondary | ICD-10-CM | POA: Diagnosis not present

## 2021-03-31 DIAGNOSIS — A000 Cholera due to Vibrio cholerae 01, biovar cholerae: Secondary | ICD-10-CM | POA: Diagnosis not present

## 2021-03-31 DIAGNOSIS — E860 Dehydration: Secondary | ICD-10-CM | POA: Diagnosis not present

## 2021-03-31 DIAGNOSIS — Z7689 Persons encountering health services in other specified circumstances: Secondary | ICD-10-CM | POA: Diagnosis not present

## 2021-03-31 DIAGNOSIS — R69 Illness, unspecified: Secondary | ICD-10-CM | POA: Diagnosis not present

## 2021-04-01 DIAGNOSIS — N184 Chronic kidney disease, stage 4 (severe): Secondary | ICD-10-CM | POA: Diagnosis not present

## 2021-04-01 DIAGNOSIS — I10 Essential (primary) hypertension: Secondary | ICD-10-CM | POA: Diagnosis not present

## 2021-04-01 DIAGNOSIS — R69 Illness, unspecified: Secondary | ICD-10-CM | POA: Diagnosis not present

## 2021-04-01 DIAGNOSIS — R2689 Other abnormalities of gait and mobility: Secondary | ICD-10-CM | POA: Diagnosis not present

## 2021-04-01 DIAGNOSIS — M6281 Muscle weakness (generalized): Secondary | ICD-10-CM | POA: Diagnosis not present

## 2021-04-01 DIAGNOSIS — D649 Anemia, unspecified: Secondary | ICD-10-CM | POA: Diagnosis not present

## 2021-04-01 DIAGNOSIS — R41841 Cognitive communication deficit: Secondary | ICD-10-CM | POA: Diagnosis not present

## 2021-04-01 DIAGNOSIS — S42202D Unspecified fracture of upper end of left humerus, subsequent encounter for fracture with routine healing: Secondary | ICD-10-CM | POA: Diagnosis not present

## 2021-04-01 DIAGNOSIS — E860 Dehydration: Secondary | ICD-10-CM | POA: Diagnosis not present

## 2021-04-01 DIAGNOSIS — Z7689 Persons encountering health services in other specified circumstances: Secondary | ICD-10-CM | POA: Diagnosis not present

## 2021-04-01 DIAGNOSIS — G9341 Metabolic encephalopathy: Secondary | ICD-10-CM | POA: Diagnosis not present

## 2021-04-02 DIAGNOSIS — R69 Illness, unspecified: Secondary | ICD-10-CM | POA: Diagnosis not present

## 2021-04-02 DIAGNOSIS — M6281 Muscle weakness (generalized): Secondary | ICD-10-CM | POA: Diagnosis not present

## 2021-04-02 DIAGNOSIS — G9341 Metabolic encephalopathy: Secondary | ICD-10-CM | POA: Diagnosis not present

## 2021-04-02 DIAGNOSIS — S42202D Unspecified fracture of upper end of left humerus, subsequent encounter for fracture with routine healing: Secondary | ICD-10-CM | POA: Diagnosis not present

## 2021-04-02 DIAGNOSIS — R2689 Other abnormalities of gait and mobility: Secondary | ICD-10-CM | POA: Diagnosis not present

## 2021-04-02 DIAGNOSIS — R41841 Cognitive communication deficit: Secondary | ICD-10-CM | POA: Diagnosis not present

## 2021-04-02 DIAGNOSIS — I10 Essential (primary) hypertension: Secondary | ICD-10-CM | POA: Diagnosis not present

## 2021-04-03 DIAGNOSIS — R69 Illness, unspecified: Secondary | ICD-10-CM | POA: Diagnosis not present

## 2021-04-03 DIAGNOSIS — S42202D Unspecified fracture of upper end of left humerus, subsequent encounter for fracture with routine healing: Secondary | ICD-10-CM | POA: Diagnosis not present

## 2021-04-03 DIAGNOSIS — R41841 Cognitive communication deficit: Secondary | ICD-10-CM | POA: Diagnosis not present

## 2021-04-03 DIAGNOSIS — R2689 Other abnormalities of gait and mobility: Secondary | ICD-10-CM | POA: Diagnosis not present

## 2021-04-03 DIAGNOSIS — I10 Essential (primary) hypertension: Secondary | ICD-10-CM | POA: Diagnosis not present

## 2021-04-03 DIAGNOSIS — G9341 Metabolic encephalopathy: Secondary | ICD-10-CM | POA: Diagnosis not present

## 2021-04-03 DIAGNOSIS — M6281 Muscle weakness (generalized): Secondary | ICD-10-CM | POA: Diagnosis not present

## 2021-04-06 DIAGNOSIS — Z1152 Encounter for screening for COVID-19: Secondary | ICD-10-CM | POA: Diagnosis not present

## 2021-04-06 DIAGNOSIS — I10 Essential (primary) hypertension: Secondary | ICD-10-CM | POA: Diagnosis not present

## 2021-04-06 DIAGNOSIS — R69 Illness, unspecified: Secondary | ICD-10-CM | POA: Diagnosis not present

## 2021-04-06 DIAGNOSIS — S42202D Unspecified fracture of upper end of left humerus, subsequent encounter for fracture with routine healing: Secondary | ICD-10-CM | POA: Diagnosis not present

## 2021-04-06 DIAGNOSIS — R2689 Other abnormalities of gait and mobility: Secondary | ICD-10-CM | POA: Diagnosis not present

## 2021-04-06 DIAGNOSIS — R070 Pain in throat: Secondary | ICD-10-CM | POA: Diagnosis not present

## 2021-04-06 DIAGNOSIS — R41841 Cognitive communication deficit: Secondary | ICD-10-CM | POA: Diagnosis not present

## 2021-04-06 DIAGNOSIS — G9341 Metabolic encephalopathy: Secondary | ICD-10-CM | POA: Diagnosis not present

## 2021-04-06 DIAGNOSIS — M6281 Muscle weakness (generalized): Secondary | ICD-10-CM | POA: Diagnosis not present

## 2021-04-14 DIAGNOSIS — Z87440 Personal history of urinary (tract) infections: Secondary | ICD-10-CM | POA: Diagnosis not present

## 2021-04-15 DIAGNOSIS — N390 Urinary tract infection, site not specified: Secondary | ICD-10-CM | POA: Diagnosis not present

## 2021-04-17 DIAGNOSIS — R42 Dizziness and giddiness: Secondary | ICD-10-CM | POA: Diagnosis not present

## 2021-04-17 DIAGNOSIS — H9190 Unspecified hearing loss, unspecified ear: Secondary | ICD-10-CM | POA: Diagnosis not present

## 2021-04-17 DIAGNOSIS — Z87440 Personal history of urinary (tract) infections: Secondary | ICD-10-CM | POA: Diagnosis not present

## 2021-04-18 NOTE — Progress Notes (Unsigned)
Cardiology Office Note:    Date:  04/18/2021   ID:  [name], DOB [birth year], MRN [id number]  PCP:  [name], MD   [facility] Providers Cardiologist:  [name], MD Referring MD: [name],*   Chief Complaint/Reason for Referral:  Cardiology follow up  ASSESSMENT:    Chronic diastolic CHF (congestive heart failure), NYHA class 2 (HCC)  PAF (paroxysmal atrial fibrillation) ([place])  Hypertension, unspecified type  Hyperlipidemia, unspecified hyperlipidemia type  Stage 3a chronic kidney disease ([place])  Coronary artery calcification seen on CAT scan  Aortic atherosclerosis ([place])  PLAN:    In order of problems listed above:  1.  Start Jardiance 10 mg daily; follow up in 6 months or earlier if needed. 2.  No evidence of atrial fibrillation on last pacemaker interrogation.  Continue Xarelto for now.  FALLS? 3.  HTN 4.  HL 5.  Creatinine around 1, not limiting if nephrotoxic agents are required.  6.  Cont Xarelto versus ASA; given advanced age, will defer statin. 7.  See discussion number 6 above.       {Are you ordering a CV Procedure (e.g. stress test, cath, DCCV, TEE, etc)?   Press F2        :[id number]}   Dispo:  No follow-ups on file.     Medication Adjustments/Labs and Tests Ordered: Current medicines are reviewed at length with the patient today.  Concerns regarding medicines are outlined above.   Tests Ordered: No orders of the defined types were placed in this encounter.   Medication Changes: No orders of the defined types were placed in this encounter.   History of Present Illness:    FOCUSED PROBLEM LIST:   1.  Heart failure with preserved ejection fraction  2.  Sinus node dysfunction status post permanent pacemaker  3.  Paroxysmal atrial fibrillation on Xarelto 4.  Hypertension  5.  Hyperlipidemia  6.  Chronic kidney disease  7.  COPD  8.  Three vessel coronary calcification and aortic atherosclerosis on  CT 2021   The patient is a 88 y.o. female with the indicated medical history here for cardiology follow-up.         Previous Medical History: Past Medical History:  Diagnosis Date   Anemia    "I stay anemic"   Arthritis    Asthma    no problems recent   Cataract    Chronic diastolic CHF (congestive heart failure), NYHA class 2 ([place]) 03/19/2014   Echo 4/19: mod LVH, EF 55-60, no RWMA, Gr 1 DD, MAC, trivial MR, mod LAE, normal RVSF, PASP 19   CKD (chronic kidney disease), stage III (HCC)    Complication of anesthesia    CONFUSED AFTER KNEE SURGERY   Depression    Diverticulitis    DVT (deep venous thrombosis) (HCC)    from birth control, left groin   Essential hypertension    Fibromyalgia 10 y.a.   GERD (gastroesophageal reflux disease)    Gout    Hx of cardiovascular stress test    Lexiscan Myoview (8/15): apical attenuation artifact, no ischemia, EF 69% - low risk.   Hypercholesteremia    Odynophagia    Other abnormal glucose    PAF (paroxysmal atrial fibrillation) (HCC)    Personal history of other diseases of digestive system    Pneumonia    hx of   Presence of permanent cardiac pacemaker    Sinus bradycardia    Skin cancer 2014   skin R elbow & face  Symptomatic bradycardia 06/2016     Current Medications: No outpatient medications have been marked as taking for the 04/21/21 encounter (Appointment) with Early Osmond, MD.     Allergies:    Amoxicillin, Diltiazem hcl, Losartan potassium, Potassium-containing compounds, Sulfonamide derivatives, Zetia [ezetimibe], and Penicillins   Social History:   Social History   Tobacco Use   Smoking status: Former    Packs/day: 1.00    Years: 10.00    Pack years: 10.00    Types: Cigarettes    Quit date: 02/08/1973    Years since quitting: 48.2   Smokeless tobacco: Never  Vaping Use   Vaping Use: Never used  Substance Use Topics   Alcohol use: No   Drug use: No     Family Hx: Family History  Problem  Relation Age of Onset   Heart disease Mother    Hypertension Mother    Fainting Mother    Arrhythmia Mother    Diabetes Father    Stroke Father    Hypertension Father    Cancer Brother    Sudden death Brother    Heart attack Brother    Cancer Sister    Diabetes Sister    Asthma Sister      Review of Systems:   Please see the history of present illness.    All other systems reviewed and are negative.     EKGs/Labs/Other Test Reviewed:    EKG:  EKG 11/22 SR with LAFB  Prior CV studies:  TTE 2021 1. Left ventricular ejection fraction, by estimation, is 65 to 70%. The  left ventricle has normal function. The left ventricle has no regional  wall motion abnormalities. There is moderate left ventricular hypertrophy.  Left ventricular diastolic  parameters are consistent with Grade I diastolic dysfunction (impaired  relaxation).   2. Right ventricular systolic function is normal. The right ventricular  size is normal. There is mildly elevated pulmonary artery systolic  pressure. The estimated right ventricular systolic pressure is 37.1 mmHg.   3. Left atrial size was mild to moderately dilated.   4. The mitral valve is normal in structure. Trivial mitral valve  regurgitation. No evidence of mitral stenosis.   5. The aortic valve is normal in structure. Aortic valve regurgitation is  not visualized. No aortic stenosis is present.   6. The inferior vena cava is dilated in size with >50% respiratory  variability, suggesting right atrial pressure of 8 mmHg.  Imaging studies that I have independently reviewed today:   3/21 CT 1. No CT evidence for acute cardiopulmonary abnormality. 2. Cardiomegaly with diffuse 3 vessel coronary artery calcifications and aortic atherosclerosis. 3. Scattered fine subpleural reticular densities involving both lungs, suspected to reflect a degree of underlying fibrotic lung disease, similar to previous. 4. Dilated main pulmonary artery,  suggesting underlying pulmonary hypertension. 5. Subacute healing fractures of the right anterior third through sixth ribs. 6. Probable splenomegaly, partially visualized.  Recent Labs: 11/21/2020: ALT 11 11/29/2020: Magnesium 2.0 01/05/2021: BUN 22; Creatinine, Ser 1.10; Hemoglobin 9.8; Platelets 122; Potassium 4.2; Sodium 142   Recent Lipid Panel Lab Results  Component Value Date/Time   CHOL  06/07/2008 04:05 AM    118        ATP III CLASSIFICATION:  <200     mg/dL   Desirable  200-239  mg/dL   Borderline High  >=240    mg/dL   High          TRIG 85 06/07/2008 04:05 AM  HDL 35 (L) 06/07/2008 04:05 AM   LDLCALC  06/07/2008 04:05 AM    66        Total Cholesterol/HDL:CHD Risk Coronary Heart Disease Risk Table                     Men   Women  1/2 Average Risk   3.4   3.3  Average Risk       5.0   4.4  2 X Average Risk   9.6   7.1  3 X Average Risk  23.4   11.0        Use the calculated Patient Ratio above and the CHD Risk Table to determine the patient's CHD Risk.        ATP III CLASSIFICATION (LDL):  <100     mg/dL   Optimal  100-129  mg/dL   Near or Above                    Optimal  130-159  mg/dL   Borderline  160-189  mg/dL   High  >190     mg/dL   Very High    Risk Assessment/Calculations:    {Does this patient have ATRIAL FIBRILLATION?:[phone]}      Physical Exam:    VS:  There were no vitals taken for this visit.   Wt Readings from Last 3 Encounters:  01/05/21 160 lb (72.6 kg)  11/26/20 165 lb 9.1 oz (75.1 kg)  07/31/19 155 lb 9.6 oz (70.6 kg)    GENERAL:  No apparent distress, AOx3 HEENT:  No carotid bruits, +2 carotid impulses, no scleral icterus CAR: RRR Irregular RR*** no murmurs***, gallops, rubs, or thrills RES:  Clear to auscultation bilaterally ABD:  Soft, nontender, nondistended, positive bowel sounds x 4 VASC:  +2 radial pulses, +2 carotid pulses, palpable pedal pulses NEURO:  CN 2-12 grossly intact; motor and sensory grossly  intact PSYCH:  No active depression or anxiety EXT:  No edema, ecchymosis, or cyanosis  Signed, Early Osmond, MD  04/18/2021 10:02 AM    [address], [place], [place]  [address] Phone: [phone]; Fax: [phone]   Note:  This document was prepared using Dragon voice recognition software and may include unintentional dictation errors.

## 2021-04-20 ENCOUNTER — Ambulatory Visit (INDEPENDENT_AMBULATORY_CARE_PROVIDER_SITE_OTHER): Payer: Medicare Other

## 2021-04-20 DIAGNOSIS — Z7689 Persons encountering health services in other specified circumstances: Secondary | ICD-10-CM | POA: Diagnosis not present

## 2021-04-20 DIAGNOSIS — I495 Sick sinus syndrome: Secondary | ICD-10-CM | POA: Diagnosis not present

## 2021-04-20 DIAGNOSIS — E860 Dehydration: Secondary | ICD-10-CM | POA: Diagnosis not present

## 2021-04-20 DIAGNOSIS — R42 Dizziness and giddiness: Secondary | ICD-10-CM | POA: Diagnosis not present

## 2021-04-21 ENCOUNTER — Other Ambulatory Visit: Payer: Self-pay

## 2021-04-21 ENCOUNTER — Encounter: Payer: Self-pay | Admitting: Internal Medicine

## 2021-04-21 ENCOUNTER — Ambulatory Visit (INDEPENDENT_AMBULATORY_CARE_PROVIDER_SITE_OTHER): Payer: Medicare Other | Admitting: Internal Medicine

## 2021-04-21 VITALS — BP 100/70 | HR 79 | Ht 63.0 in | Wt 160.0 lb

## 2021-04-21 DIAGNOSIS — E785 Hyperlipidemia, unspecified: Secondary | ICD-10-CM

## 2021-04-21 DIAGNOSIS — I480 Paroxysmal atrial fibrillation: Secondary | ICD-10-CM | POA: Diagnosis not present

## 2021-04-21 DIAGNOSIS — I700 Atherosclerosis of aorta: Secondary | ICD-10-CM

## 2021-04-21 DIAGNOSIS — I10 Essential (primary) hypertension: Secondary | ICD-10-CM | POA: Diagnosis not present

## 2021-04-21 DIAGNOSIS — R079 Chest pain, unspecified: Secondary | ICD-10-CM

## 2021-04-21 DIAGNOSIS — I5032 Chronic diastolic (congestive) heart failure: Secondary | ICD-10-CM | POA: Diagnosis not present

## 2021-04-21 DIAGNOSIS — I2510 Atherosclerotic heart disease of native coronary artery without angina pectoris: Secondary | ICD-10-CM | POA: Diagnosis not present

## 2021-04-21 DIAGNOSIS — N1831 Chronic kidney disease, stage 3a: Secondary | ICD-10-CM

## 2021-04-21 LAB — CUP PACEART REMOTE DEVICE CHECK
Battery Remaining Longevity: 66 mo
Battery Remaining Percentage: 100 %
Brady Statistic RA Percent Paced: 72 %
Brady Statistic RV Percent Paced: 0 %
Date Time Interrogation Session: 20230311133200
Implantable Lead Implant Date: 20180524
Implantable Lead Implant Date: 20180524
Implantable Lead Location: 753859
Implantable Lead Location: 753860
Implantable Lead Model: 7740
Implantable Lead Model: 7741
Implantable Lead Serial Number: 693708
Implantable Lead Serial Number: 861246
Implantable Pulse Generator Implant Date: 20180524
Lead Channel Impedance Value: 557 Ohm
Lead Channel Impedance Value: 600 Ohm
Lead Channel Pacing Threshold Amplitude: 0.4 V
Lead Channel Pacing Threshold Amplitude: 1.6 V
Lead Channel Pacing Threshold Pulse Width: 0.4 ms
Lead Channel Pacing Threshold Pulse Width: 0.4 ms
Lead Channel Setting Pacing Amplitude: 2 V
Lead Channel Setting Pacing Amplitude: 2.4 V
Lead Channel Setting Pacing Pulse Width: 0.4 ms
Lead Channel Setting Sensing Sensitivity: 2 mV
Pulse Gen Serial Number: 308044

## 2021-04-21 MED ORDER — NITROGLYCERIN 0.4 MG SL SUBL: 0.4000 mg | SUBLINGUAL_TABLET | SUBLINGUAL | 3 refills | Status: DC | PRN

## 2021-04-21 MED ORDER — EMPAGLIFLOZIN 10 MG PO TABS: 10.0000 mg | ORAL_TABLET | Freq: Every day | ORAL | 3 refills | Status: DC

## 2021-04-21 NOTE — Patient Instructions (Addendum)
Medication Instructions:  ?1) Stop Amlodipine  ? ?2) Stop Xarelto  ? ?3) Start Aspirin 325 mg daily  ? ?4) Start Nitroglycerin 0.4 mg every 5 minutes as needed for chest pain  ? ?5) Start empaglifozin (Jardiance) 10 mg daily  ? ?*If you need a refill on your cardiac medications before your next appointment, please call your pharmacy* ? ? ?Lab Work: ?None ordered  ? ?If you have labs (blood work) drawn today and your tests are completely normal, you will receive your results only by: ?MyChart Message (if you have MyChart) OR ?A paper copy in the mail ?If you have any lab test that is abnormal or we need to change your treatment, we will call you to review the results. ? ? ?Testing/Procedures: ?None ordered  ? ? ?Follow-Up: ?At [facility], you and your health needs are our priority.  As part of our continuing mission to provide you with exceptional heart care, we have created designated Provider Care Teams.  These Care Teams include your primary Cardiologist (physician) and Advanced Practice Providers (APPs -  Physician Assistants and Nurse Practitioners) who all work together to provide you with the care you need, when you need it. ? ?We recommend signing up for the patient portal called "MyChart".  Sign up information is provided on this After Visit Summary.  MyChart is used to connect with patients for Virtual Visits (Telemedicine).  Patients are able to view lab/test results, encounter notes, upcoming appointments, etc.  Non-urgent messages can be sent to your provider as well.   ?To learn more about what you can do with MyChart, go to [email or web address].   ? ?Your next appointment:   ?6 month(s) ? ?The format for your next appointment:   ?In Person ? ?Provider:   ?Dr. [name]   ? ? ?Other Instructions ?None   ?

## 2021-04-28 DIAGNOSIS — M6281 Muscle weakness (generalized): Secondary | ICD-10-CM | POA: Diagnosis not present

## 2021-04-28 DIAGNOSIS — Z9181 History of falling: Secondary | ICD-10-CM | POA: Diagnosis not present

## 2021-04-28 DIAGNOSIS — R2689 Other abnormalities of gait and mobility: Secondary | ICD-10-CM | POA: Diagnosis not present

## 2021-05-04 DIAGNOSIS — F0390 Unspecified dementia without behavioral disturbance: Secondary | ICD-10-CM | POA: Diagnosis not present

## 2021-05-04 DIAGNOSIS — L299 Pruritus, unspecified: Secondary | ICD-10-CM | POA: Diagnosis not present

## 2021-05-04 DIAGNOSIS — F411 Generalized anxiety disorder: Secondary | ICD-10-CM | POA: Diagnosis not present

## 2021-05-04 DIAGNOSIS — R600 Localized edema: Secondary | ICD-10-CM | POA: Diagnosis not present

## 2021-05-04 NOTE — Progress Notes (Signed)
Remote pacemaker transmission.   

## 2021-05-08 DIAGNOSIS — R52 Pain, unspecified: Secondary | ICD-10-CM | POA: Diagnosis not present

## 2021-05-08 DIAGNOSIS — R2689 Other abnormalities of gait and mobility: Secondary | ICD-10-CM | POA: Diagnosis not present

## 2021-05-08 DIAGNOSIS — Z9181 History of falling: Secondary | ICD-10-CM | POA: Diagnosis not present

## 2021-05-08 DIAGNOSIS — W19XXXA Unspecified fall, initial encounter: Secondary | ICD-10-CM | POA: Diagnosis not present

## 2021-05-08 DIAGNOSIS — M6281 Muscle weakness (generalized): Secondary | ICD-10-CM | POA: Diagnosis not present

## 2021-05-11 DIAGNOSIS — G8911 Acute pain due to trauma: Secondary | ICD-10-CM | POA: Diagnosis not present

## 2021-05-11 DIAGNOSIS — R52 Pain, unspecified: Secondary | ICD-10-CM | POA: Diagnosis not present

## 2021-05-11 DIAGNOSIS — Z9181 History of falling: Secondary | ICD-10-CM | POA: Diagnosis not present

## 2021-05-12 DIAGNOSIS — R52 Pain, unspecified: Secondary | ICD-10-CM | POA: Diagnosis not present

## 2021-05-12 DIAGNOSIS — Z9181 History of falling: Secondary | ICD-10-CM | POA: Diagnosis not present

## 2021-05-12 DIAGNOSIS — Z7689 Persons encountering health services in other specified circumstances: Secondary | ICD-10-CM | POA: Diagnosis not present

## 2021-05-20 DIAGNOSIS — S42292S Other displaced fracture of upper end of left humerus, sequela: Secondary | ICD-10-CM | POA: Diagnosis not present

## 2021-05-20 DIAGNOSIS — R296 Repeated falls: Secondary | ICD-10-CM | POA: Diagnosis not present

## 2021-05-20 DIAGNOSIS — I4891 Unspecified atrial fibrillation: Secondary | ICD-10-CM | POA: Diagnosis not present

## 2021-05-20 DIAGNOSIS — I10 Essential (primary) hypertension: Secondary | ICD-10-CM | POA: Diagnosis not present

## 2021-05-20 DIAGNOSIS — I5032 Chronic diastolic (congestive) heart failure: Secondary | ICD-10-CM | POA: Diagnosis not present

## 2021-05-20 DIAGNOSIS — M533 Sacrococcygeal disorders, not elsewhere classified: Secondary | ICD-10-CM | POA: Diagnosis not present

## 2021-05-26 ENCOUNTER — Telehealth: Payer: Self-pay

## 2021-05-26 NOTE — Telephone Encounter (Signed)
Attempted to contact patient's daughter [name] to schedule a Palliative Care consult appointment. No answer and unable to leave a message voicemail is full.  ? ?

## 2021-05-27 DIAGNOSIS — Z86718 Personal history of other venous thrombosis and embolism: Secondary | ICD-10-CM | POA: Diagnosis not present

## 2021-05-27 DIAGNOSIS — I5032 Chronic diastolic (congestive) heart failure: Secondary | ICD-10-CM | POA: Diagnosis not present

## 2021-05-27 DIAGNOSIS — Z950 Presence of cardiac pacemaker: Secondary | ICD-10-CM | POA: Diagnosis not present

## 2021-05-27 DIAGNOSIS — Z87440 Personal history of urinary (tract) infections: Secondary | ICD-10-CM | POA: Diagnosis not present

## 2021-05-27 DIAGNOSIS — I480 Paroxysmal atrial fibrillation: Secondary | ICD-10-CM | POA: Diagnosis not present

## 2021-05-27 DIAGNOSIS — Z7901 Long term (current) use of anticoagulants: Secondary | ICD-10-CM | POA: Diagnosis not present

## 2021-05-27 DIAGNOSIS — H269 Unspecified cataract: Secondary | ICD-10-CM | POA: Diagnosis not present

## 2021-05-27 DIAGNOSIS — E785 Hyperlipidemia, unspecified: Secondary | ICD-10-CM | POA: Diagnosis not present

## 2021-05-27 DIAGNOSIS — M797 Fibromyalgia: Secondary | ICD-10-CM | POA: Diagnosis not present

## 2021-05-27 DIAGNOSIS — I2510 Atherosclerotic heart disease of native coronary artery without angina pectoris: Secondary | ICD-10-CM | POA: Diagnosis not present

## 2021-05-27 DIAGNOSIS — K219 Gastro-esophageal reflux disease without esophagitis: Secondary | ICD-10-CM | POA: Diagnosis not present

## 2021-05-27 DIAGNOSIS — Z85828 Personal history of other malignant neoplasm of skin: Secondary | ICD-10-CM | POA: Diagnosis not present

## 2021-05-27 DIAGNOSIS — Z8781 Personal history of (healed) traumatic fracture: Secondary | ICD-10-CM | POA: Diagnosis not present

## 2021-05-27 DIAGNOSIS — M109 Gout, unspecified: Secondary | ICD-10-CM | POA: Diagnosis not present

## 2021-05-27 DIAGNOSIS — J45909 Unspecified asthma, uncomplicated: Secondary | ICD-10-CM | POA: Diagnosis not present

## 2021-05-27 DIAGNOSIS — I700 Atherosclerosis of aorta: Secondary | ICD-10-CM | POA: Diagnosis not present

## 2021-05-27 DIAGNOSIS — Z87891 Personal history of nicotine dependence: Secondary | ICD-10-CM | POA: Diagnosis not present

## 2021-05-27 DIAGNOSIS — I130 Hypertensive heart and chronic kidney disease with heart failure and stage 1 through stage 4 chronic kidney disease, or unspecified chronic kidney disease: Secondary | ICD-10-CM | POA: Diagnosis not present

## 2021-05-27 DIAGNOSIS — Z8701 Personal history of pneumonia (recurrent): Secondary | ICD-10-CM | POA: Diagnosis not present

## 2021-05-27 DIAGNOSIS — S42202D Unspecified fracture of upper end of left humerus, subsequent encounter for fracture with routine healing: Secondary | ICD-10-CM | POA: Diagnosis not present

## 2021-05-27 DIAGNOSIS — F32A Depression, unspecified: Secondary | ICD-10-CM | POA: Diagnosis not present

## 2021-05-27 DIAGNOSIS — N1831 Chronic kidney disease, stage 3a: Secondary | ICD-10-CM | POA: Diagnosis not present

## 2021-05-27 DIAGNOSIS — D631 Anemia in chronic kidney disease: Secondary | ICD-10-CM | POA: Diagnosis not present

## 2021-05-27 DIAGNOSIS — K5792 Diverticulitis of intestine, part unspecified, without perforation or abscess without bleeding: Secondary | ICD-10-CM | POA: Diagnosis not present

## 2021-05-27 DIAGNOSIS — M1612 Unilateral primary osteoarthritis, left hip: Secondary | ICD-10-CM | POA: Diagnosis not present

## 2021-06-01 ENCOUNTER — Telehealth: Payer: Self-pay

## 2021-06-01 NOTE — Telephone Encounter (Signed)
Spoke with patient's daughter [name] regarding Palliative Care referral. She requested a call back on 4/25. ?

## 2021-06-02 ENCOUNTER — Telehealth: Payer: Self-pay

## 2021-06-02 NOTE — Telephone Encounter (Signed)
Spoke with patient's daughter [name] and scheduled a Mychart Palliative Consult for 06/11/21 @ 9AM. ? ?Consent obtained; updated Netsmart, Team List and Epic.  ? ?

## 2021-06-02 NOTE — Telephone Encounter (Signed)
Patient's daughter requested a call back on 4/25 @ 12:40 regarding Palliative Care services. Attempted to contact [facility] no answer and unable to leave a message voicemail is full.  ?

## 2021-06-03 DIAGNOSIS — N1831 Chronic kidney disease, stage 3a: Secondary | ICD-10-CM | POA: Diagnosis not present

## 2021-06-03 DIAGNOSIS — M1612 Unilateral primary osteoarthritis, left hip: Secondary | ICD-10-CM | POA: Diagnosis not present

## 2021-06-03 DIAGNOSIS — I130 Hypertensive heart and chronic kidney disease with heart failure and stage 1 through stage 4 chronic kidney disease, or unspecified chronic kidney disease: Secondary | ICD-10-CM | POA: Diagnosis not present

## 2021-06-03 DIAGNOSIS — S42202D Unspecified fracture of upper end of left humerus, subsequent encounter for fracture with routine healing: Secondary | ICD-10-CM | POA: Diagnosis not present

## 2021-06-03 DIAGNOSIS — I5032 Chronic diastolic (congestive) heart failure: Secondary | ICD-10-CM | POA: Diagnosis not present

## 2021-06-03 DIAGNOSIS — D631 Anemia in chronic kidney disease: Secondary | ICD-10-CM | POA: Diagnosis not present

## 2021-06-05 DIAGNOSIS — S42202D Unspecified fracture of upper end of left humerus, subsequent encounter for fracture with routine healing: Secondary | ICD-10-CM | POA: Diagnosis not present

## 2021-06-05 DIAGNOSIS — I5032 Chronic diastolic (congestive) heart failure: Secondary | ICD-10-CM | POA: Diagnosis not present

## 2021-06-05 DIAGNOSIS — D631 Anemia in chronic kidney disease: Secondary | ICD-10-CM | POA: Diagnosis not present

## 2021-06-05 DIAGNOSIS — M1612 Unilateral primary osteoarthritis, left hip: Secondary | ICD-10-CM | POA: Diagnosis not present

## 2021-06-05 DIAGNOSIS — I130 Hypertensive heart and chronic kidney disease with heart failure and stage 1 through stage 4 chronic kidney disease, or unspecified chronic kidney disease: Secondary | ICD-10-CM | POA: Diagnosis not present

## 2021-06-05 DIAGNOSIS — N1831 Chronic kidney disease, stage 3a: Secondary | ICD-10-CM | POA: Diagnosis not present

## 2021-06-06 DIAGNOSIS — I130 Hypertensive heart and chronic kidney disease with heart failure and stage 1 through stage 4 chronic kidney disease, or unspecified chronic kidney disease: Secondary | ICD-10-CM | POA: Diagnosis not present

## 2021-06-06 DIAGNOSIS — N1831 Chronic kidney disease, stage 3a: Secondary | ICD-10-CM | POA: Diagnosis not present

## 2021-06-06 DIAGNOSIS — S42202D Unspecified fracture of upper end of left humerus, subsequent encounter for fracture with routine healing: Secondary | ICD-10-CM | POA: Diagnosis not present

## 2021-06-06 DIAGNOSIS — I5032 Chronic diastolic (congestive) heart failure: Secondary | ICD-10-CM | POA: Diagnosis not present

## 2021-06-06 DIAGNOSIS — M1612 Unilateral primary osteoarthritis, left hip: Secondary | ICD-10-CM | POA: Diagnosis not present

## 2021-06-06 DIAGNOSIS — D631 Anemia in chronic kidney disease: Secondary | ICD-10-CM | POA: Diagnosis not present

## 2021-06-08 DIAGNOSIS — I130 Hypertensive heart and chronic kidney disease with heart failure and stage 1 through stage 4 chronic kidney disease, or unspecified chronic kidney disease: Secondary | ICD-10-CM | POA: Diagnosis not present

## 2021-06-08 DIAGNOSIS — M1612 Unilateral primary osteoarthritis, left hip: Secondary | ICD-10-CM | POA: Diagnosis not present

## 2021-06-08 DIAGNOSIS — N1831 Chronic kidney disease, stage 3a: Secondary | ICD-10-CM | POA: Diagnosis not present

## 2021-06-08 DIAGNOSIS — S42202D Unspecified fracture of upper end of left humerus, subsequent encounter for fracture with routine healing: Secondary | ICD-10-CM | POA: Diagnosis not present

## 2021-06-08 DIAGNOSIS — D631 Anemia in chronic kidney disease: Secondary | ICD-10-CM | POA: Diagnosis not present

## 2021-06-08 DIAGNOSIS — I5032 Chronic diastolic (congestive) heart failure: Secondary | ICD-10-CM | POA: Diagnosis not present

## 2021-06-11 ENCOUNTER — Telehealth: Payer: Medicare Other | Admitting: Hospice

## 2021-06-11 DIAGNOSIS — R531 Weakness: Secondary | ICD-10-CM | POA: Diagnosis not present

## 2021-06-11 DIAGNOSIS — F0390 Unspecified dementia without behavioral disturbance: Secondary | ICD-10-CM

## 2021-06-11 DIAGNOSIS — F339 Major depressive disorder, recurrent, unspecified: Secondary | ICD-10-CM

## 2021-06-11 DIAGNOSIS — I5032 Chronic diastolic (congestive) heart failure: Secondary | ICD-10-CM | POA: Diagnosis not present

## 2021-06-11 DIAGNOSIS — Z515 Encounter for palliative care: Secondary | ICD-10-CM | POA: Diagnosis not present

## 2021-06-11 NOTE — Progress Notes (Signed)
? ? ?Manufacturing engineer ?Community Palliative Care Consult Note ?Telephone: [phone]  ?Fax: [phone] ? ?PATIENT NAME: [name] ?[address] ?[place] [address] ?[phone] (home)  ?DOB: 1927-12-25 ?MRN: [id number] ? ?PRIMARY CARE PROVIDER:    ?[name], MD,  ?[address] ?[place] [address] ?[phone] ? ?REFERRING PROVIDER:   ?[name], MD ?[address] ?[address] ?[place],  [place] [address] ?[phone] ? ?RESPONSIBLE PARTY:   Prentiss Bells NP/Philip Garnetta Buddy is oldest son  ?[name] [phone] ?Patient likes to be called [name] ?Contact Information   ? ? Name Relation Home Work Mobile  ? Carollee Herter Daughter [phone]    ? Worthy Rancher [phone]    ? Chatel,Suzanne Granddaughter [phone]  [phone]  ? Harding,Bridget Friend [phone]  [phone]  ? ?  ? ? ?TELEHEALTH VISIT STATEMENT ?Due to the COVID-19 crisis, this visit was done via telemedicine from my office and it was initiated and consent by this patient and or family.  ?I connected with patient OR PROXY by a telephone/video  and verified that I am speaking with the correct person. I discussed the limitations of evaluation and management by telemedicine. The patient expressed understanding and agreed to proceed. ?Palliative Care was asked to follow this patient to address advance care planning, complex medical decision making and goals of care clarification. [name] is with patient during visit. This is the initial visit.  ? ?  ASSESSMENT AND / RECOMMENDATIONS:  ? ?Advance Care Planning: Our advance care planning conversation included a discussion about:    ?The value and importance of advance care planning  ?Difference between Hospice and Palliative care ?Exploration of goals of care in the event of a sudden injury or illness  ?Identification and preparation of a healthcare agent  ?Review and updating or creation of an  advance directive document  . ?Decision not to resuscitate or to de-escalate disease focused treatments due to poor prognosis. ? ?CODE STATUS: Discussion on code status. Patient is a DNR ? ?Goals of Care: Goals include to maximize quality of life and symptom management. Family is interested in hospice in the future when she qualifies for it.  ? ?Patient discharged from SNF last month for acute rehab s/p weakness/fall.  ? ?I spent 16 minutes providing this initial consultation. More than 50% of the time in this consultation was spent on counseling patient and coordinating communication. ?-------------------------------------------------------------------------------------------------------------------------------------- ? ?Symptom Management/Plan: ?Weakness: Ongoing PT/OT for strengthening and gait balance. Ambulatory with rolling walker. Fall precautions.  ?Dementia:  progressive memory loss/confusion, repetitive phrases, impoverished thoughts in line with Dementia disease trajectory. Continue ongoing supportive care.  ?CHF: Followed by Cardiologist, Nephrologist for CKD.  [name] to call pharmacy for refill of Lasix. Continue with Jardiance as ordered. [name] reports Cardiologist discontinued BP medications.  ?Elevate BL extremities to promote circulation. Monitor and report  weight gain of 2 Ib in a day or 5 Ib in a week.   ?Depression: Managed with Zoloft. ?COPD: Managed with Breo, Albuterol breathing treatments. Avoid triggers. Followed by Pulmonologist ? ?Follow up: Palliative care will continue to follow for complex medical decision making, advance care planning, and clarification of goals. Return 6 weeks or prn. Encouraged to call provider sooner with any concerns.  ? ?Family /Caregiver/Community Supports: Patient lives at home with her family - Satira Anis. Strong family support system identified.  ? ?HOSPICE ELIGIBILITY/DIAGNOSIS: TBD ? ?Chief Complaint: Initial Palliative care visit ? ?HISTORY OF PRESENT ILLNESS:  [name]  is a 88  y.o. year old female  with multiple morbidities requiring close monitoring and with high risk of complications and  mortality: Chronic diastolic congestive heart failure, COPD, depression CKD. History of  left shoulder and left pelvis fracture.  Patient denies pain/discomfort, in no acute distress.  Independent history and some review of system provided by [name] due to patient with cognitive impairments.  ?History obtained from review of EMR, discussion with primary team, caregiver, family and/or Ms. Sottile.  ?Review and summarization of Epic records shows history from other than patient. Rest of 10 point ROS asked and negative.  ?Independent interpretation of tests and reviewed as needed, available labs, patient records, imaging, studies and related documents from the EMR. ? ?ROS ?General: NAD, hard of hearing ?EYES: denies vision changes ?ENMT: denies dysphagia ?Cardiovascular: denies chest pain/discomfort ?Pulmonary: denies cough, denies SOB ?Abdomen: endorses good appetite, denies constipation/diarrhea ?GU: denies dysuria, urinary frequency ?MSK:  endorses weakness, endorses gait disturbance ?Skin: denies rashes or wounds ?Neurological: denies pain, denies insomnia ?Psych: Endorses positive mood ?Heme/lymph/immuno: denies bruises, abnormal bleeding ? ? ?PAST MEDICAL HISTORY:  ?Active Ambulatory Problems  ?  Diagnosis Date Noted  ? HYPERTENSION, UNSPECIFIED 08/19/2008  ? PAROXYSMAL ATRIAL FIBRILLATION 08/19/2008  ? Chest pain 07/30/2009  ? GASTROESOPHAGEAL REFLUX DISEASE, HX OF 08/19/2008  ? Dizziness 07/09/2010  ? Anemia, unspecified 03/19/2012  ? Unspecified deficiency anemia 03/19/2012  ? Pulmonary hypertension ([place]) 03/07/2013  ? Osteoarthritis of left knee 04/04/2013  ? Total knee replacement status 04/04/2013  ? Acute blood loss anemia 04/05/2013  ? Hypercholesteremia   ? GERD (gastroesophageal reflux disease)   ? Depression   ? Asthma   ? Gout   ? Gastroenteritis 07/20/2013  ? Abdominal pain  07/20/2013  ? Abnormal x-ray of abdomen 07/20/2013  ? Diarrhea 07/20/2013  ? Generalized weakness 07/20/2013  ? Obesity (BMI 30-39.9) 07/20/2013  ? Fatigue 09/11/2013  ? Shortness of breath 03/19/2014  ? Chronic diastolic CHF (congestive heart failure), NYHA class 2 ([place]) 03/19/2014  ? Palpitations 09/18/2014  ? Sinus bradycardia   ? CKD (chronic kidney disease), stage III   ? Normochromic normocytic anemia   ? Essential hypertension   ? PAF (paroxysmal atrial fibrillation) ([place])   ? Hypertensive heart disease 04/01/2016  ? Bradycardia 04/01/2016  ? Fall 04/01/2016  ? Symptomatic bradycardia 06/30/2016  ? Thrombocytopenia ([place]) 01/15/2017  ? CAP (community acquired pneumonia) 01/15/2017  ? Constipation 04/25/2017  ? Urine finding 04/25/2017  ? Posterior rhinorrhea 04/25/2017  ? Acute upper respiratory infection 04/25/2017  ? Fever 05/14/2017  ? Renal insufficiency   ? Chronic respiratory failure with hypoxia ([place]) 06/09/2017  ? Pleural effusion associated with pulmonary infection 06/09/2017  ? Hyperkalemia 06/09/2017  ? Pleuritic chest pain 06/09/2017  ? Pacemaker 07/06/2017  ? Hip fracture ([place]) 02/10/2018  ? Closed comminuted intertrochanteric fracture of left femur ([place]) 02/10/2018  ? Non-ST elevated myocardial infarction ([place]) 06/27/2019  ? Right upper lobe pneumonia 11/21/2020  ? Acute metabolic encephalopathy 54/62/7035  ? ?Resolved Ambulatory Problems  ?  Diagnosis Date Noted  ? Nausea & vomiting 07/20/2013  ? N&V (nausea and vomiting) 07/20/2013  ? Diaphoresis 09/18/2014  ? ?Past Medical History:  ?Diagnosis Date  ? Anemia   ? Arthritis   ? Cataract   ? Complication of anesthesia   ? Diverticulitis   ? DVT (deep venous thrombosis) ([place])   ? Fibromyalgia 10 y.a.  ? Hx of cardiovascular stress test   ? Odynophagia   ? Other abnormal glucose   ? Pneumonia   ?  Presence of permanent cardiac pacemaker   ? Skin cancer 2014  ? ? ?SOCIAL HX:  ?Social History  ? ?Tobacco Use  ? Smoking status: Former  ?  Packs/day:  1.00  ?  Years: 10.00  ?  Pack years: 10.00  ?  Types: Cigarettes  ?  Quit date: 02/08/1973  ?  Years since quitting: 48.3  ? Smokeless tobacco: Never  ?Substance Use Topics  ? Alcohol use: No  ? ?  ?FAMILY HX:  ?

## 2021-06-12 DIAGNOSIS — I130 Hypertensive heart and chronic kidney disease with heart failure and stage 1 through stage 4 chronic kidney disease, or unspecified chronic kidney disease: Secondary | ICD-10-CM | POA: Diagnosis not present

## 2021-06-12 DIAGNOSIS — I5032 Chronic diastolic (congestive) heart failure: Secondary | ICD-10-CM | POA: Diagnosis not present

## 2021-06-12 DIAGNOSIS — D631 Anemia in chronic kidney disease: Secondary | ICD-10-CM | POA: Diagnosis not present

## 2021-06-12 DIAGNOSIS — M1612 Unilateral primary osteoarthritis, left hip: Secondary | ICD-10-CM | POA: Diagnosis not present

## 2021-06-12 DIAGNOSIS — N1831 Chronic kidney disease, stage 3a: Secondary | ICD-10-CM | POA: Diagnosis not present

## 2021-06-12 DIAGNOSIS — S42202D Unspecified fracture of upper end of left humerus, subsequent encounter for fracture with routine healing: Secondary | ICD-10-CM | POA: Diagnosis not present

## 2021-06-15 DIAGNOSIS — S42202D Unspecified fracture of upper end of left humerus, subsequent encounter for fracture with routine healing: Secondary | ICD-10-CM | POA: Diagnosis not present

## 2021-06-15 DIAGNOSIS — I5032 Chronic diastolic (congestive) heart failure: Secondary | ICD-10-CM | POA: Diagnosis not present

## 2021-06-15 DIAGNOSIS — I130 Hypertensive heart and chronic kidney disease with heart failure and stage 1 through stage 4 chronic kidney disease, or unspecified chronic kidney disease: Secondary | ICD-10-CM | POA: Diagnosis not present

## 2021-06-15 DIAGNOSIS — M1612 Unilateral primary osteoarthritis, left hip: Secondary | ICD-10-CM | POA: Diagnosis not present

## 2021-06-15 DIAGNOSIS — N1831 Chronic kidney disease, stage 3a: Secondary | ICD-10-CM | POA: Diagnosis not present

## 2021-06-15 DIAGNOSIS — D631 Anemia in chronic kidney disease: Secondary | ICD-10-CM | POA: Diagnosis not present

## 2021-06-17 DIAGNOSIS — S42202D Unspecified fracture of upper end of left humerus, subsequent encounter for fracture with routine healing: Secondary | ICD-10-CM | POA: Diagnosis not present

## 2021-06-19 DIAGNOSIS — M1612 Unilateral primary osteoarthritis, left hip: Secondary | ICD-10-CM | POA: Diagnosis not present

## 2021-06-19 DIAGNOSIS — I130 Hypertensive heart and chronic kidney disease with heart failure and stage 1 through stage 4 chronic kidney disease, or unspecified chronic kidney disease: Secondary | ICD-10-CM | POA: Diagnosis not present

## 2021-06-19 DIAGNOSIS — I5032 Chronic diastolic (congestive) heart failure: Secondary | ICD-10-CM | POA: Diagnosis not present

## 2021-06-19 DIAGNOSIS — S42202D Unspecified fracture of upper end of left humerus, subsequent encounter for fracture with routine healing: Secondary | ICD-10-CM | POA: Diagnosis not present

## 2021-06-19 DIAGNOSIS — N1831 Chronic kidney disease, stage 3a: Secondary | ICD-10-CM | POA: Diagnosis not present

## 2021-06-19 DIAGNOSIS — D631 Anemia in chronic kidney disease: Secondary | ICD-10-CM | POA: Diagnosis not present

## 2021-06-22 DIAGNOSIS — N1831 Chronic kidney disease, stage 3a: Secondary | ICD-10-CM | POA: Diagnosis not present

## 2021-06-22 DIAGNOSIS — M1612 Unilateral primary osteoarthritis, left hip: Secondary | ICD-10-CM | POA: Diagnosis not present

## 2021-06-22 DIAGNOSIS — S42202D Unspecified fracture of upper end of left humerus, subsequent encounter for fracture with routine healing: Secondary | ICD-10-CM | POA: Diagnosis not present

## 2021-06-22 DIAGNOSIS — I130 Hypertensive heart and chronic kidney disease with heart failure and stage 1 through stage 4 chronic kidney disease, or unspecified chronic kidney disease: Secondary | ICD-10-CM | POA: Diagnosis not present

## 2021-06-22 DIAGNOSIS — I5032 Chronic diastolic (congestive) heart failure: Secondary | ICD-10-CM | POA: Diagnosis not present

## 2021-06-22 DIAGNOSIS — D631 Anemia in chronic kidney disease: Secondary | ICD-10-CM | POA: Diagnosis not present

## 2021-06-23 DIAGNOSIS — M1612 Unilateral primary osteoarthritis, left hip: Secondary | ICD-10-CM | POA: Diagnosis not present

## 2021-06-23 DIAGNOSIS — I130 Hypertensive heart and chronic kidney disease with heart failure and stage 1 through stage 4 chronic kidney disease, or unspecified chronic kidney disease: Secondary | ICD-10-CM | POA: Diagnosis not present

## 2021-06-23 DIAGNOSIS — N1831 Chronic kidney disease, stage 3a: Secondary | ICD-10-CM | POA: Diagnosis not present

## 2021-06-23 DIAGNOSIS — D631 Anemia in chronic kidney disease: Secondary | ICD-10-CM | POA: Diagnosis not present

## 2021-06-23 DIAGNOSIS — I5032 Chronic diastolic (congestive) heart failure: Secondary | ICD-10-CM | POA: Diagnosis not present

## 2021-06-23 DIAGNOSIS — S42202D Unspecified fracture of upper end of left humerus, subsequent encounter for fracture with routine healing: Secondary | ICD-10-CM | POA: Diagnosis not present

## 2021-06-26 DIAGNOSIS — I5032 Chronic diastolic (congestive) heart failure: Secondary | ICD-10-CM | POA: Diagnosis not present

## 2021-06-26 DIAGNOSIS — Z86718 Personal history of other venous thrombosis and embolism: Secondary | ICD-10-CM | POA: Diagnosis not present

## 2021-06-26 DIAGNOSIS — K219 Gastro-esophageal reflux disease without esophagitis: Secondary | ICD-10-CM | POA: Diagnosis not present

## 2021-06-26 DIAGNOSIS — N1831 Chronic kidney disease, stage 3a: Secondary | ICD-10-CM | POA: Diagnosis not present

## 2021-06-26 DIAGNOSIS — M1612 Unilateral primary osteoarthritis, left hip: Secondary | ICD-10-CM | POA: Diagnosis not present

## 2021-06-26 DIAGNOSIS — Z87440 Personal history of urinary (tract) infections: Secondary | ICD-10-CM | POA: Diagnosis not present

## 2021-06-26 DIAGNOSIS — M109 Gout, unspecified: Secondary | ICD-10-CM | POA: Diagnosis not present

## 2021-06-26 DIAGNOSIS — D631 Anemia in chronic kidney disease: Secondary | ICD-10-CM | POA: Diagnosis not present

## 2021-06-26 DIAGNOSIS — I130 Hypertensive heart and chronic kidney disease with heart failure and stage 1 through stage 4 chronic kidney disease, or unspecified chronic kidney disease: Secondary | ICD-10-CM | POA: Diagnosis not present

## 2021-06-26 DIAGNOSIS — Z85828 Personal history of other malignant neoplasm of skin: Secondary | ICD-10-CM | POA: Diagnosis not present

## 2021-06-26 DIAGNOSIS — K5792 Diverticulitis of intestine, part unspecified, without perforation or abscess without bleeding: Secondary | ICD-10-CM | POA: Diagnosis not present

## 2021-06-26 DIAGNOSIS — F32A Depression, unspecified: Secondary | ICD-10-CM | POA: Diagnosis not present

## 2021-06-26 DIAGNOSIS — Z7901 Long term (current) use of anticoagulants: Secondary | ICD-10-CM | POA: Diagnosis not present

## 2021-06-26 DIAGNOSIS — H269 Unspecified cataract: Secondary | ICD-10-CM | POA: Diagnosis not present

## 2021-06-26 DIAGNOSIS — E785 Hyperlipidemia, unspecified: Secondary | ICD-10-CM | POA: Diagnosis not present

## 2021-06-26 DIAGNOSIS — I2510 Atherosclerotic heart disease of native coronary artery without angina pectoris: Secondary | ICD-10-CM | POA: Diagnosis not present

## 2021-06-26 DIAGNOSIS — M797 Fibromyalgia: Secondary | ICD-10-CM | POA: Diagnosis not present

## 2021-06-26 DIAGNOSIS — Z87891 Personal history of nicotine dependence: Secondary | ICD-10-CM | POA: Diagnosis not present

## 2021-06-26 DIAGNOSIS — Z950 Presence of cardiac pacemaker: Secondary | ICD-10-CM | POA: Diagnosis not present

## 2021-06-26 DIAGNOSIS — S42202D Unspecified fracture of upper end of left humerus, subsequent encounter for fracture with routine healing: Secondary | ICD-10-CM | POA: Diagnosis not present

## 2021-06-26 DIAGNOSIS — I700 Atherosclerosis of aorta: Secondary | ICD-10-CM | POA: Diagnosis not present

## 2021-06-26 DIAGNOSIS — I480 Paroxysmal atrial fibrillation: Secondary | ICD-10-CM | POA: Diagnosis not present

## 2021-06-26 DIAGNOSIS — Z8701 Personal history of pneumonia (recurrent): Secondary | ICD-10-CM | POA: Diagnosis not present

## 2021-06-26 DIAGNOSIS — J45909 Unspecified asthma, uncomplicated: Secondary | ICD-10-CM | POA: Diagnosis not present

## 2021-06-26 DIAGNOSIS — Z8781 Personal history of (healed) traumatic fracture: Secondary | ICD-10-CM | POA: Diagnosis not present

## 2021-06-30 DIAGNOSIS — M1612 Unilateral primary osteoarthritis, left hip: Secondary | ICD-10-CM | POA: Diagnosis not present

## 2021-06-30 DIAGNOSIS — N1831 Chronic kidney disease, stage 3a: Secondary | ICD-10-CM | POA: Diagnosis not present

## 2021-06-30 DIAGNOSIS — D631 Anemia in chronic kidney disease: Secondary | ICD-10-CM | POA: Diagnosis not present

## 2021-06-30 DIAGNOSIS — S42202D Unspecified fracture of upper end of left humerus, subsequent encounter for fracture with routine healing: Secondary | ICD-10-CM | POA: Diagnosis not present

## 2021-06-30 DIAGNOSIS — I130 Hypertensive heart and chronic kidney disease with heart failure and stage 1 through stage 4 chronic kidney disease, or unspecified chronic kidney disease: Secondary | ICD-10-CM | POA: Diagnosis not present

## 2021-06-30 DIAGNOSIS — I5032 Chronic diastolic (congestive) heart failure: Secondary | ICD-10-CM | POA: Diagnosis not present

## 2021-07-03 DIAGNOSIS — M1612 Unilateral primary osteoarthritis, left hip: Secondary | ICD-10-CM | POA: Diagnosis not present

## 2021-07-03 DIAGNOSIS — N1831 Chronic kidney disease, stage 3a: Secondary | ICD-10-CM | POA: Diagnosis not present

## 2021-07-03 DIAGNOSIS — I130 Hypertensive heart and chronic kidney disease with heart failure and stage 1 through stage 4 chronic kidney disease, or unspecified chronic kidney disease: Secondary | ICD-10-CM | POA: Diagnosis not present

## 2021-07-03 DIAGNOSIS — D631 Anemia in chronic kidney disease: Secondary | ICD-10-CM | POA: Diagnosis not present

## 2021-07-03 DIAGNOSIS — I5032 Chronic diastolic (congestive) heart failure: Secondary | ICD-10-CM | POA: Diagnosis not present

## 2021-07-03 DIAGNOSIS — S42202D Unspecified fracture of upper end of left humerus, subsequent encounter for fracture with routine healing: Secondary | ICD-10-CM | POA: Diagnosis not present

## 2021-07-04 DIAGNOSIS — I5032 Chronic diastolic (congestive) heart failure: Secondary | ICD-10-CM | POA: Diagnosis not present

## 2021-07-04 DIAGNOSIS — S42202D Unspecified fracture of upper end of left humerus, subsequent encounter for fracture with routine healing: Secondary | ICD-10-CM | POA: Diagnosis not present

## 2021-07-04 DIAGNOSIS — N1831 Chronic kidney disease, stage 3a: Secondary | ICD-10-CM | POA: Diagnosis not present

## 2021-07-04 DIAGNOSIS — M1612 Unilateral primary osteoarthritis, left hip: Secondary | ICD-10-CM | POA: Diagnosis not present

## 2021-07-04 DIAGNOSIS — I130 Hypertensive heart and chronic kidney disease with heart failure and stage 1 through stage 4 chronic kidney disease, or unspecified chronic kidney disease: Secondary | ICD-10-CM | POA: Diagnosis not present

## 2021-07-04 DIAGNOSIS — D631 Anemia in chronic kidney disease: Secondary | ICD-10-CM | POA: Diagnosis not present

## 2021-07-13 DIAGNOSIS — I5032 Chronic diastolic (congestive) heart failure: Secondary | ICD-10-CM | POA: Diagnosis not present

## 2021-07-13 DIAGNOSIS — I130 Hypertensive heart and chronic kidney disease with heart failure and stage 1 through stage 4 chronic kidney disease, or unspecified chronic kidney disease: Secondary | ICD-10-CM | POA: Diagnosis not present

## 2021-07-13 DIAGNOSIS — N1831 Chronic kidney disease, stage 3a: Secondary | ICD-10-CM | POA: Diagnosis not present

## 2021-07-13 DIAGNOSIS — S42202D Unspecified fracture of upper end of left humerus, subsequent encounter for fracture with routine healing: Secondary | ICD-10-CM | POA: Diagnosis not present

## 2021-07-13 DIAGNOSIS — M1612 Unilateral primary osteoarthritis, left hip: Secondary | ICD-10-CM | POA: Diagnosis not present

## 2021-07-13 DIAGNOSIS — D631 Anemia in chronic kidney disease: Secondary | ICD-10-CM | POA: Diagnosis not present

## 2021-07-14 DIAGNOSIS — S42202D Unspecified fracture of upper end of left humerus, subsequent encounter for fracture with routine healing: Secondary | ICD-10-CM | POA: Diagnosis not present

## 2021-07-14 DIAGNOSIS — I5032 Chronic diastolic (congestive) heart failure: Secondary | ICD-10-CM | POA: Diagnosis not present

## 2021-07-14 DIAGNOSIS — D631 Anemia in chronic kidney disease: Secondary | ICD-10-CM | POA: Diagnosis not present

## 2021-07-14 DIAGNOSIS — N1831 Chronic kidney disease, stage 3a: Secondary | ICD-10-CM | POA: Diagnosis not present

## 2021-07-14 DIAGNOSIS — I130 Hypertensive heart and chronic kidney disease with heart failure and stage 1 through stage 4 chronic kidney disease, or unspecified chronic kidney disease: Secondary | ICD-10-CM | POA: Diagnosis not present

## 2021-07-14 DIAGNOSIS — M1612 Unilateral primary osteoarthritis, left hip: Secondary | ICD-10-CM | POA: Diagnosis not present

## 2021-07-16 ENCOUNTER — Other Ambulatory Visit: Payer: Medicare Other | Admitting: Hospice

## 2021-07-16 DIAGNOSIS — F339 Major depressive disorder, recurrent, unspecified: Secondary | ICD-10-CM | POA: Diagnosis not present

## 2021-07-16 DIAGNOSIS — F0390 Unspecified dementia without behavioral disturbance: Secondary | ICD-10-CM | POA: Diagnosis not present

## 2021-07-16 DIAGNOSIS — R531 Weakness: Secondary | ICD-10-CM | POA: Diagnosis not present

## 2021-07-16 DIAGNOSIS — I5032 Chronic diastolic (congestive) heart failure: Secondary | ICD-10-CM | POA: Diagnosis not present

## 2021-07-16 DIAGNOSIS — Z515 Encounter for palliative care: Secondary | ICD-10-CM | POA: Diagnosis not present

## 2021-07-16 NOTE — Progress Notes (Signed)
[facility] Consult Note Telephone: [phone]  Fax: [phone]  PATIENT NAME: [name] [id number] [phone] (home)  DOB: [birth year] MRN: [id number]  PRIMARY CARE PROVIDER:    [name], MD,  [facility]. [address] [place] [address] [phone]  REFERRING PROVIDER:   [name], MD [address] [place],  [place] [address] [phone]  RESPONSIBLE PARTY:   Prentiss Bells NP/Philip Garnetta Buddy is oldest son  [name] [phone] Patient likes to be called [place]     Name Relation Home Work Brandt Daughter [phone]     Worthy Rancher [phone]     [employer] [phone]  [phone]   Izola Price [phone]  [phone]       Palliative Care was asked to follow this patient to address advance care planning, complex medical decision making and goals of care clarification. [name] is with patient during visit. This is the initial visit.     ASSESSMENT AND / RECOMMENDATIONS:   Advance Care Planning:  Exploration of goals of care in the event of a sudden injury or illness  Identification and preparation of a healthcare agent  Review and updating or creation of an  advance directive document . Decision not to resuscitate or to de-escalate disease focused treatments due to poor prognosis.  CODE STATUS: Discussion on code status. Patient is a DNR.  NP signed DNR form for patient to keep at home; same document uploaded to epic today.  Goals of Care: Goals include to maximize quality of life and symptom management. Family is interested in hospice in the future when she qualifies for it. MOST form given to [place] for Prentiss Bells NP for next meeting to further clarify goals of care.   I spent 16 minutes providing this initial consultation. More than 50% of the time in this  consultation was spent on counseling patient and coordinating communication. --------------------------------------------------------------------------------------------------------------------------------------  Symptom Management/Plan: CHF: Followed by Cardiologist.  Patient is off Lasix due to worsening kidney function. No edema or s/s of fluid overload at this time.  [name] reports Cardiologist discontinued BP medications.  Adhere to salt and fluid limits.  Elevate BL extremities to promote circulation. Monitor and report  weight gain of 2 Ib in a day or 5 Ib in a week.  Routine CBC BMP    Weakness: Completed PT. OT is ongoing for strengthening and gait training and environmental adaptation.  Ambulatory with rolling walker. Fall precautions.  Dementia:  progressive memory loss/confusion, repetitive phrases, impoverished thoughts in line with Dementia disease trajectory. FAST 6D. Encourage word search/puzzles. Continue ongoing supportive care.   COPD: Managed with Breo, albuterol breathing treatments.  Followed by pulmonologist.  Avoid triggers.  Encourage slow deep breathing.    Follow up: Palliative care will continue to follow for complex medical decision making, advance care planning, and clarification of goals. Return 6 weeks or prn. Encouraged to call provider sooner with any concerns.   Family /Caregiver/Community Supports: Patient lives at home with her family - [name]. Strong family support system identified.   HOSPICE ELIGIBILITY/DIAGNOSIS: TBD  Chief Complaint: Follow-up visit  HISTORY OF PRESENT ILLNESS:  [name] is a 88 y.o. year old female  with multiple morbidities requiring close monitoring and with high risk of complications and  mortality: Chronic diastolic congestive heart failure, COPD, depression CKD. History of  left shoulder and left pelvis fracture.  Patient denies pain/discomfort,  in no acute distress.  History obtained from review of EMR, discussion  with primary team, caregiver, family and/or Ms. Pownall.  Review and summarization of Epic records shows history from other than patient. Rest of 10 point ROS asked and negative.  Independent interpretation of tests and reviewed as needed, available labs, patient records, imaging, studies and related documents from the EMR   PAST MEDICAL HISTORY:  Active Ambulatory Problems    Diagnosis Date Noted   HYPERTENSION, UNSPECIFIED 08/19/2008   PAROXYSMAL ATRIAL FIBRILLATION 08/19/2008   Chest pain 07/30/2009   GASTROESOPHAGEAL REFLUX DISEASE, HX OF 08/19/2008   Dizziness 07/09/2010   Anemia, unspecified 03/19/2012   Unspecified deficiency anemia 03/19/2012   Pulmonary hypertension ([place]) 03/07/2013   Osteoarthritis of left knee 04/04/2013   Total knee replacement status 04/04/2013   Acute blood loss anemia 04/05/2013   Hypercholesteremia    GERD (gastroesophageal reflux disease)    Depression    Asthma    Gout    Gastroenteritis 07/20/2013   Abdominal pain 07/20/2013   Abnormal x-ray of abdomen 07/20/2013   Diarrhea 07/20/2013   Generalized weakness 07/20/2013   Obesity (BMI 30-39.9) 07/20/2013   Fatigue 09/11/2013   Shortness of breath 03/19/2014   Chronic diastolic CHF (congestive heart failure), NYHA class 2 ([place]) 03/19/2014   Palpitations 09/18/2014   Sinus bradycardia    CKD (chronic kidney disease), stage III    Normochromic normocytic anemia    Essential hypertension    PAF (paroxysmal atrial fibrillation) ([place])    Hypertensive heart disease 04/01/2016   Bradycardia 04/01/2016   Fall 04/01/2016   Symptomatic bradycardia 06/30/2016   Thrombocytopenia ([place]) 01/15/2017   CAP (community acquired pneumonia) 01/15/2017   Constipation 04/25/2017   Urine finding 04/25/2017   Posterior rhinorrhea 04/25/2017   Acute upper respiratory infection 04/25/2017   Fever 05/14/2017   Renal insufficiency    Chronic respiratory failure with hypoxia ([place]) 06/09/2017   Pleural effusion  associated with pulmonary infection 06/09/2017   Hyperkalemia 06/09/2017   Pleuritic chest pain 06/09/2017   Pacemaker 07/06/2017   Hip fracture ([place]) 02/10/2018   Closed comminuted intertrochanteric fracture of left femur ([place]) 02/10/2018   Non-ST elevated myocardial infarction ([place]) 06/27/2019   Right upper lobe pneumonia 29/92/4268   Acute metabolic encephalopathy 34/19/6222   Resolved Ambulatory Problems    Diagnosis Date Noted   Nausea & vomiting 07/20/2013   N&V (nausea and vomiting) 07/20/2013   Diaphoresis 09/18/2014   Past Medical History:  Diagnosis Date   Anemia    Arthritis    Cataract    Complication of anesthesia    Diverticulitis    DVT (deep venous thrombosis) ([place])    Fibromyalgia 10 y.a.   Hx of cardiovascular stress test    Odynophagia    Other abnormal glucose    Pneumonia    Presence of permanent cardiac pacemaker    Skin cancer 2014    SOCIAL HX:  Social History   Tobacco Use   Smoking status: Former    Packs/day: 1.00    Years: 10.00    Total pack years: 10.00    Types: Cigarettes    Quit date: 02/08/1973    Years since quitting: 48.4   Smokeless tobacco: Never  Substance Use Topics   Alcohol use: No     FAMILY HX:  Family History  Problem Relation Age of Onset   Heart disease Mother    Hypertension Mother    Fainting Mother    Arrhythmia Mother  Diabetes Father    Stroke Father    Hypertension Father    Cancer Brother    Sudden death Brother    Heart attack Brother    Cancer Sister    Diabetes Sister    Asthma Sister       ALLERGIES:  Allergies  Allergen Reactions   Amoxicillin Other (See Comments)    Very weak Has patient had a PCN reaction causing immediate rash, facial/tongue/throat swelling, SOB or lightheadedness with hypotension: no Has patient had a PCN reaction causing severe rash involving mucus membranes or skin necrosis: no Has patient had a PCN reaction that required hospitalization: no Has patient had a  PCN reaction occurring within the last 10 years: no If all of the above answers are "NO", then may proceed with Cephalosporin use.    Diltiazem Hcl Other (See Comments)    Reaction unknown   Losartan Potassium Other (See Comments)    Potassium level increased drastically   Potassium-Containing Compounds Other (See Comments)    Pt is very sensitive to potassium products. Her Potassium rises very quickly and takes a long time to come down.   Sulfonamide Derivatives Other (See Comments)    Reaction unknown   Zetia [Ezetimibe] Other (See Comments)    Weakness   Penicillins Rash    DID THE REACTION INVOLVE: Swelling of the face/tongue/throat, SOB, or low BP? Unknown Sudden or severe rash/hives, skin peeling, or the inside of the mouth or nose? Unknown Did it require medical treatment? Unknown When did it last happen?      unknown If all above answers are "NO", may proceed with cephalosporin use.       PERTINENT MEDICATIONS:  Outpatient Encounter Medications as of 07/16/2021  Medication Sig   acetaminophen (TYLENOL) 500 MG tablet Take 2 tablets (1,000 mg total) by mouth every 8 (eight) hours as needed for mild pain or headache.   albuterol (2.5 MG/3ML) 0.083% NEBU 3 mL, albuterol (5 MG/ML) 0.5% NEBU 0.5 mL Inhale 5 mg into the lungs every 6 (six) hours as needed (shortness of breath/wheezing).   albuterol (PROVENTIL HFA;VENTOLIN HFA) 108 (90 BASE) MCG/ACT inhaler Inhale 1 puff into the lungs every 6 (six) hours as needed for wheezing or shortness of breath.   alendronate (FOSAMAX) 70 MG tablet Take 70 mg by mouth every Friday.   allopurinol (ZYLOPRIM) 100 MG tablet Take 1 tablet (100 mg total) by mouth daily.   aspirin 325 MG EC tablet Take 325 mg by mouth daily.   cholecalciferol (VITAMIN D3) 25 MCG (1000 UNIT) tablet Take 1,000 Units by mouth daily.   empagliflozin (JARDIANCE) 10 MG TABS tablet Take 1 tablet (10 mg total) by mouth daily before breakfast.   famotidine (PEPCID) 20 MG tablet  Take 20 mg by mouth 2 (two) times daily.   ferrous sulfate 325 (65 FE) MG tablet Take 325 mg by mouth daily.    fluticasone furoate-vilanterol (BREO ELLIPTA) 100-25 MCG/INH AEPB Inhale 1 puff into the lungs daily.   furosemide (LASIX) 20 MG tablet Take 1 tablet (20 mg total) by mouth 2 (two) times daily. (Patient not taking: Reported on 04/21/2021)   furosemide (LASIX) 40 MG tablet Take 40 mg by mouth daily as needed for fluid or edema.   HYDROcodone-acetaminophen (NORCO/VICODIN) 5-325 MG tablet Take 1 tablet by mouth every 4 (four) hours as needed.   ibuprofen (ADVIL) 400 MG tablet Take 400 mg by mouth every 6 (six) hours as needed for mild pain or moderate pain. (Patient not  taking: Reported on 04/21/2021)   methocarbamol (ROBAXIN) 500 MG tablet Take 500 mg by mouth 2 (two) times daily as needed for muscle spasms. (Patient not taking: Reported on 04/21/2021)   montelukast (SINGULAIR) 10 MG tablet TAKE ONE TABLET BY MOUTH EVERYDAY AT BEDTIME (Patient taking differently: Take 10 mg by mouth at bedtime.)   nitroGLYCERIN (NITROSTAT) 0.4 MG SL tablet Place 1 tablet (0.4 mg total) under the tongue every 5 (five) minutes as needed for chest pain.   ondansetron (ZOFRAN-ODT) 8 MG disintegrating tablet Take 8 mg by mouth every 8 (eight) hours as needed for nausea or vomiting.    sertraline (ZOLOFT) 50 MG tablet Take 50 mg by mouth daily.    traMADol (ULTRAM) 50 MG tablet Take 50 mg by mouth daily as needed for pain.   No facility-administered encounter medications on file as of 07/16/2021.     Thank you for the opportunity to participate in the care of Ms. Watrous.  The palliative care team will continue to follow. Please call our office at [phone] if we can be of additional assistance.   Note: Portions of this note were generated with [employer]. Dictation errors may occur despite best attempts at proofreading.  Teodoro Spray, NP

## 2021-07-20 ENCOUNTER — Ambulatory Visit (INDEPENDENT_AMBULATORY_CARE_PROVIDER_SITE_OTHER): Payer: Medicare Other

## 2021-07-20 DIAGNOSIS — I495 Sick sinus syndrome: Secondary | ICD-10-CM | POA: Diagnosis not present

## 2021-07-20 DIAGNOSIS — D631 Anemia in chronic kidney disease: Secondary | ICD-10-CM | POA: Diagnosis not present

## 2021-07-20 DIAGNOSIS — M1612 Unilateral primary osteoarthritis, left hip: Secondary | ICD-10-CM | POA: Diagnosis not present

## 2021-07-20 DIAGNOSIS — I5032 Chronic diastolic (congestive) heart failure: Secondary | ICD-10-CM | POA: Diagnosis not present

## 2021-07-20 DIAGNOSIS — S42202D Unspecified fracture of upper end of left humerus, subsequent encounter for fracture with routine healing: Secondary | ICD-10-CM | POA: Diagnosis not present

## 2021-07-20 DIAGNOSIS — I130 Hypertensive heart and chronic kidney disease with heart failure and stage 1 through stage 4 chronic kidney disease, or unspecified chronic kidney disease: Secondary | ICD-10-CM | POA: Diagnosis not present

## 2021-07-20 DIAGNOSIS — N1831 Chronic kidney disease, stage 3a: Secondary | ICD-10-CM | POA: Diagnosis not present

## 2021-07-22 DIAGNOSIS — R8290 Unspecified abnormal findings in urine: Secondary | ICD-10-CM | POA: Diagnosis not present

## 2021-07-22 LAB — CUP PACEART REMOTE DEVICE CHECK
Battery Remaining Longevity: 60 mo
Battery Remaining Percentage: 94 %
Brady Statistic RA Percent Paced: 71 %
Brady Statistic RV Percent Paced: 0 %
Date Time Interrogation Session: 20230614044000
Implantable Lead Implant Date: 20180524
Implantable Lead Implant Date: 20180524
Implantable Lead Location: 753859
Implantable Lead Location: 753860
Implantable Lead Model: 7740
Implantable Lead Model: 7741
Implantable Lead Serial Number: 693708
Implantable Lead Serial Number: 861246
Implantable Pulse Generator Implant Date: 20180524
Lead Channel Impedance Value: 557 Ohm
Lead Channel Impedance Value: 673 Ohm
Lead Channel Pacing Threshold Amplitude: 0.4 V
Lead Channel Pacing Threshold Amplitude: 1.4 V
Lead Channel Pacing Threshold Pulse Width: 0.4 ms
Lead Channel Pacing Threshold Pulse Width: 0.4 ms
Lead Channel Setting Pacing Amplitude: 2 V
Lead Channel Setting Pacing Amplitude: 2.4 V
Lead Channel Setting Pacing Pulse Width: 0.4 ms
Lead Channel Setting Sensing Sensitivity: 2 mV
Pulse Gen Serial Number: 308044

## 2021-07-24 DIAGNOSIS — I5032 Chronic diastolic (congestive) heart failure: Secondary | ICD-10-CM | POA: Diagnosis not present

## 2021-07-24 DIAGNOSIS — M1612 Unilateral primary osteoarthritis, left hip: Secondary | ICD-10-CM | POA: Diagnosis not present

## 2021-07-24 DIAGNOSIS — D631 Anemia in chronic kidney disease: Secondary | ICD-10-CM | POA: Diagnosis not present

## 2021-07-24 DIAGNOSIS — N1831 Chronic kidney disease, stage 3a: Secondary | ICD-10-CM | POA: Diagnosis not present

## 2021-07-24 DIAGNOSIS — S42202D Unspecified fracture of upper end of left humerus, subsequent encounter for fracture with routine healing: Secondary | ICD-10-CM | POA: Diagnosis not present

## 2021-07-24 DIAGNOSIS — I130 Hypertensive heart and chronic kidney disease with heart failure and stage 1 through stage 4 chronic kidney disease, or unspecified chronic kidney disease: Secondary | ICD-10-CM | POA: Diagnosis not present

## 2021-07-27 ENCOUNTER — Inpatient Hospital Stay (HOSPITAL_COMMUNITY)
Admission: EM | Admit: 2021-07-27 | Discharge: 2021-08-03 | DRG: 551 | Disposition: A | Discharge status: Skilled Nursing Facility | Payer: Medicare Other | Attending: Internal Medicine | Admitting: Internal Medicine

## 2021-07-27 ENCOUNTER — Other Ambulatory Visit: Payer: Self-pay

## 2021-07-27 ENCOUNTER — Encounter (HOSPITAL_COMMUNITY): Payer: Self-pay | Admitting: Family Medicine

## 2021-07-27 ENCOUNTER — Emergency Department (HOSPITAL_COMMUNITY): Payer: Medicare Other

## 2021-07-27 DIAGNOSIS — W0110XA Fall on same level from slipping, tripping and stumbling with subsequent striking against unspecified object, initial encounter: Secondary | ICD-10-CM | POA: Diagnosis present

## 2021-07-27 DIAGNOSIS — Z882 Allergy status to sulfonamides status: Secondary | ICD-10-CM

## 2021-07-27 DIAGNOSIS — K219 Gastro-esophageal reflux disease without esophagitis: Secondary | ICD-10-CM | POA: Diagnosis present

## 2021-07-27 DIAGNOSIS — S12100A Unspecified displaced fracture of second cervical vertebra, initial encounter for closed fracture: Secondary | ICD-10-CM | POA: Diagnosis not present

## 2021-07-27 DIAGNOSIS — R1312 Dysphagia, oropharyngeal phase: Secondary | ICD-10-CM | POA: Diagnosis not present

## 2021-07-27 DIAGNOSIS — N1832 Chronic kidney disease, stage 3b: Secondary | ICD-10-CM | POA: Diagnosis present

## 2021-07-27 DIAGNOSIS — S129XXA Fracture of neck, unspecified, initial encounter: Secondary | ICD-10-CM

## 2021-07-27 DIAGNOSIS — M2550 Pain in unspecified joint: Secondary | ICD-10-CM | POA: Diagnosis not present

## 2021-07-27 DIAGNOSIS — R4182 Altered mental status, unspecified: Secondary | ICD-10-CM | POA: Diagnosis not present

## 2021-07-27 DIAGNOSIS — Z66 Do not resuscitate: Secondary | ICD-10-CM | POA: Diagnosis present

## 2021-07-27 DIAGNOSIS — S12110D Anterior displaced Type II dens fracture, subsequent encounter for fracture with routine healing: Secondary | ICD-10-CM | POA: Diagnosis not present

## 2021-07-27 DIAGNOSIS — D696 Thrombocytopenia, unspecified: Secondary | ICD-10-CM | POA: Diagnosis present

## 2021-07-27 DIAGNOSIS — D539 Nutritional anemia, unspecified: Secondary | ICD-10-CM | POA: Diagnosis present

## 2021-07-27 DIAGNOSIS — S12000A Unspecified displaced fracture of first cervical vertebra, initial encounter for closed fracture: Secondary | ICD-10-CM | POA: Diagnosis present

## 2021-07-27 DIAGNOSIS — D509 Iron deficiency anemia, unspecified: Secondary | ICD-10-CM | POA: Diagnosis present

## 2021-07-27 DIAGNOSIS — R41841 Cognitive communication deficit: Secondary | ICD-10-CM | POA: Diagnosis not present

## 2021-07-27 DIAGNOSIS — S0003XA Contusion of scalp, initial encounter: Secondary | ICD-10-CM | POA: Diagnosis present

## 2021-07-27 DIAGNOSIS — Z7983 Long term (current) use of bisphosphonates: Secondary | ICD-10-CM

## 2021-07-27 DIAGNOSIS — F32A Depression, unspecified: Secondary | ICD-10-CM | POA: Diagnosis present

## 2021-07-27 DIAGNOSIS — R001 Bradycardia, unspecified: Secondary | ICD-10-CM | POA: Diagnosis present

## 2021-07-27 DIAGNOSIS — I160 Hypertensive urgency: Secondary | ICD-10-CM | POA: Diagnosis present

## 2021-07-27 DIAGNOSIS — W19XXXA Unspecified fall, initial encounter: Principal | ICD-10-CM

## 2021-07-27 DIAGNOSIS — S12031A Nondisplaced posterior arch fracture of first cervical vertebra, initial encounter for closed fracture: Secondary | ICD-10-CM | POA: Diagnosis not present

## 2021-07-27 DIAGNOSIS — R2689 Other abnormalities of gait and mobility: Secondary | ICD-10-CM | POA: Diagnosis not present

## 2021-07-27 DIAGNOSIS — R278 Other lack of coordination: Secondary | ICD-10-CM | POA: Diagnosis not present

## 2021-07-27 DIAGNOSIS — R059 Cough, unspecified: Secondary | ICD-10-CM | POA: Diagnosis not present

## 2021-07-27 DIAGNOSIS — R531 Weakness: Secondary | ICD-10-CM | POA: Diagnosis not present

## 2021-07-27 DIAGNOSIS — Z8701 Personal history of pneumonia (recurrent): Secondary | ICD-10-CM

## 2021-07-27 DIAGNOSIS — R296 Repeated falls: Secondary | ICD-10-CM | POA: Diagnosis present

## 2021-07-27 DIAGNOSIS — R6889 Other general symptoms and signs: Secondary | ICD-10-CM | POA: Diagnosis not present

## 2021-07-27 DIAGNOSIS — R404 Transient alteration of awareness: Secondary | ICD-10-CM | POA: Diagnosis not present

## 2021-07-27 DIAGNOSIS — M6281 Muscle weakness (generalized): Secondary | ICD-10-CM | POA: Diagnosis not present

## 2021-07-27 DIAGNOSIS — Z743 Need for continuous supervision: Secondary | ICD-10-CM | POA: Diagnosis not present

## 2021-07-27 DIAGNOSIS — I4891 Unspecified atrial fibrillation: Secondary | ICD-10-CM | POA: Diagnosis present

## 2021-07-27 DIAGNOSIS — S022XXA Fracture of nasal bones, initial encounter for closed fracture: Secondary | ICD-10-CM

## 2021-07-27 DIAGNOSIS — Z825 Family history of asthma and other chronic lower respiratory diseases: Secondary | ICD-10-CM

## 2021-07-27 DIAGNOSIS — M797 Fibromyalgia: Secondary | ICD-10-CM | POA: Diagnosis present

## 2021-07-27 DIAGNOSIS — Z79899 Other long term (current) drug therapy: Secondary | ICD-10-CM

## 2021-07-27 DIAGNOSIS — S12110A Anterior displaced Type II dens fracture, initial encounter for closed fracture: Secondary | ICD-10-CM | POA: Diagnosis present

## 2021-07-27 DIAGNOSIS — R54 Age-related physical debility: Secondary | ICD-10-CM | POA: Diagnosis present

## 2021-07-27 DIAGNOSIS — Y92009 Unspecified place in unspecified non-institutional (private) residence as the place of occurrence of the external cause: Secondary | ICD-10-CM

## 2021-07-27 DIAGNOSIS — K5900 Constipation, unspecified: Secondary | ICD-10-CM | POA: Diagnosis present

## 2021-07-27 DIAGNOSIS — I480 Paroxysmal atrial fibrillation: Secondary | ICD-10-CM

## 2021-07-27 DIAGNOSIS — E7800 Pure hypercholesterolemia, unspecified: Secondary | ICD-10-CM | POA: Diagnosis present

## 2021-07-27 DIAGNOSIS — W1830XA Fall on same level, unspecified, initial encounter: Secondary | ICD-10-CM | POA: Diagnosis not present

## 2021-07-27 DIAGNOSIS — I130 Hypertensive heart and chronic kidney disease with heart failure and stage 1 through stage 4 chronic kidney disease, or unspecified chronic kidney disease: Secondary | ICD-10-CM | POA: Diagnosis present

## 2021-07-27 DIAGNOSIS — Z87891 Personal history of nicotine dependence: Secondary | ICD-10-CM

## 2021-07-27 DIAGNOSIS — G9341 Metabolic encephalopathy: Secondary | ICD-10-CM | POA: Diagnosis not present

## 2021-07-27 DIAGNOSIS — I6381 Other cerebral infarction due to occlusion or stenosis of small artery: Secondary | ICD-10-CM | POA: Diagnosis not present

## 2021-07-27 DIAGNOSIS — Z7984 Long term (current) use of oral hypoglycemic drugs: Secondary | ICD-10-CM

## 2021-07-27 DIAGNOSIS — I5032 Chronic diastolic (congestive) heart failure: Secondary | ICD-10-CM

## 2021-07-27 DIAGNOSIS — Z7982 Long term (current) use of aspirin: Secondary | ICD-10-CM

## 2021-07-27 DIAGNOSIS — Z86718 Personal history of other venous thrombosis and embolism: Secondary | ICD-10-CM

## 2021-07-27 DIAGNOSIS — M109 Gout, unspecified: Secondary | ICD-10-CM | POA: Diagnosis present

## 2021-07-27 DIAGNOSIS — Z9851 Tubal ligation status: Secondary | ICD-10-CM

## 2021-07-27 DIAGNOSIS — Z515 Encounter for palliative care: Secondary | ICD-10-CM | POA: Diagnosis not present

## 2021-07-27 DIAGNOSIS — D649 Anemia, unspecified: Secondary | ICD-10-CM | POA: Diagnosis not present

## 2021-07-27 DIAGNOSIS — Z880 Allergy status to penicillin: Secondary | ICD-10-CM

## 2021-07-27 DIAGNOSIS — Z85828 Personal history of other malignant neoplasm of skin: Secondary | ICD-10-CM

## 2021-07-27 DIAGNOSIS — Z8249 Family history of ischemic heart disease and other diseases of the circulatory system: Secondary | ICD-10-CM

## 2021-07-27 DIAGNOSIS — Z043 Encounter for examination and observation following other accident: Secondary | ICD-10-CM | POA: Diagnosis not present

## 2021-07-27 DIAGNOSIS — J45909 Unspecified asthma, uncomplicated: Secondary | ICD-10-CM | POA: Diagnosis present

## 2021-07-27 DIAGNOSIS — Z950 Presence of cardiac pacemaker: Secondary | ICD-10-CM

## 2021-07-27 DIAGNOSIS — Z888 Allergy status to other drugs, medicaments and biological substances status: Secondary | ICD-10-CM

## 2021-07-27 DIAGNOSIS — J9811 Atelectasis: Secondary | ICD-10-CM | POA: Diagnosis present

## 2021-07-27 DIAGNOSIS — Z90711 Acquired absence of uterus with remaining cervical stump: Secondary | ICD-10-CM

## 2021-07-27 DIAGNOSIS — F03918 Unspecified dementia, unspecified severity, with other behavioral disturbance: Secondary | ICD-10-CM | POA: Diagnosis not present

## 2021-07-27 DIAGNOSIS — Z7189 Other specified counseling: Secondary | ICD-10-CM | POA: Diagnosis not present

## 2021-07-27 DIAGNOSIS — Z7951 Long term (current) use of inhaled steroids: Secondary | ICD-10-CM

## 2021-07-27 DIAGNOSIS — S12031D Nondisplaced posterior arch fracture of first cervical vertebra, subsequent encounter for fracture with routine healing: Secondary | ICD-10-CM | POA: Diagnosis not present

## 2021-07-27 DIAGNOSIS — Z96652 Presence of left artificial knee joint: Secondary | ICD-10-CM | POA: Diagnosis present

## 2021-07-27 DIAGNOSIS — Z7401 Bed confinement status: Secondary | ICD-10-CM | POA: Diagnosis not present

## 2021-07-27 LAB — BASIC METABOLIC PANEL
Anion gap: 10 (ref 5–15)
BUN: 30 mg/dL — ABNORMAL HIGH (ref 8–23)
CO2: 21 mmol/L — ABNORMAL LOW (ref 22–32)
Calcium: 8.8 mg/dL — ABNORMAL LOW (ref 8.9–10.3)
Chloride: 110 mmol/L (ref 98–111)
Creatinine, Ser: 1.22 mg/dL — ABNORMAL HIGH (ref 0.44–1.00)
GFR, Estimated: 42 mL/min — ABNORMAL LOW (ref >60)
Glucose, Bld: 93 mg/dL (ref 70–99)
Potassium: 5 mmol/L (ref 3.5–5.1)
Sodium: 141 mmol/L (ref 135–145)

## 2021-07-27 LAB — CBC WITH DIFFERENTIAL/PLATELET
Abs Immature Granulocytes: 0.03 10*3/uL (ref 0.00–0.07)
Basophils Absolute: 0 10*3/uL (ref 0.0–0.1)
Basophils Relative: 0 %
Eosinophils Absolute: 0.1 10*3/uL (ref 0.0–0.5)
Eosinophils Relative: 2 %
HCT: 31.9 % — ABNORMAL LOW (ref 36.0–46.0)
Hemoglobin: 10.3 g/dL — ABNORMAL LOW (ref 12.0–15.0)
Immature Granulocytes: 1 %
Lymphocytes Relative: 32 %
Lymphs Abs: 1.6 10*3/uL (ref 0.7–4.0)
MCH: 33.2 pg (ref 26.0–34.0)
MCHC: 32.3 g/dL (ref 30.0–36.0)
MCV: 102.9 fL — ABNORMAL HIGH (ref 80.0–100.0)
Monocytes Absolute: 0.3 10*3/uL (ref 0.1–1.0)
Monocytes Relative: 7 %
Neutro Abs: 2.9 10*3/uL (ref 1.7–7.7)
Neutrophils Relative %: 58 %
Platelets: 146 10*3/uL — ABNORMAL LOW (ref 150–400)
RBC: 3.1 MIL/uL — ABNORMAL LOW (ref 3.87–5.11)
RDW: 14.3 % (ref 11.5–15.5)
WBC: 5.1 10*3/uL (ref 4.0–10.5)
nRBC: 0 % (ref 0.0–0.2)

## 2021-07-27 MED ORDER — FENTANYL CITRATE PF 50 MCG/ML IJ SOSY
12.5000 ug | PREFILLED_SYRINGE | INTRAMUSCULAR | Status: DC | PRN
  Administered 2021-07-27: 12.5 ug via INTRAVENOUS
  Administered 2021-07-28 (×5): 25 ug via INTRAVENOUS
  Filled 2021-07-27 (×6): qty 1

## 2021-07-27 MED ORDER — MORPHINE SULFATE (PF) 2 MG/ML IV SOLN
2.0000 mg | Freq: Once | INTRAVENOUS | Status: AC
  Administered 2021-07-27: 2 mg via INTRAVENOUS
  Filled 2021-07-27: qty 1

## 2021-07-27 MED ORDER — ALBUTEROL SULFATE (2.5 MG/3ML) 0.083% IN NEBU
2.5000 mg | INHALATION_SOLUTION | Freq: Four times a day (QID) | RESPIRATORY_TRACT | Status: DC | PRN
Start: 2021-07-27 — End: 2021-08-03
  Administered 2021-07-30: 2.5 mg via RESPIRATORY_TRACT
  Filled 2021-07-27 (×2): qty 3

## 2021-07-27 MED ORDER — ONDANSETRON HCL 4 MG/2ML IJ SOLN: 4.0000 mg | Freq: Four times a day (QID) | INTRAMUSCULAR | Status: DC | PRN

## 2021-07-27 MED ORDER — FLUTICASONE FUROATE-VILANTEROL 100-25 MCG/ACT IN AEPB
1.0000 | INHALATION_SPRAY | Freq: Every day | RESPIRATORY_TRACT | Status: DC
Start: 2021-07-28 — End: 2021-08-03
  Administered 2021-08-01 – 2021-08-03 (×3): 1 via RESPIRATORY_TRACT
  Filled 2021-07-27 (×2): qty 28

## 2021-07-27 MED ORDER — MORPHINE SULFATE (PF) 2 MG/ML IV SOLN
1.0000 mg | Freq: Once | INTRAVENOUS | Status: AC
  Administered 2021-07-27: 1 mg via INTRAVENOUS
  Filled 2021-07-27: qty 1

## 2021-07-27 MED ORDER — SODIUM CHLORIDE 0.9 % IV SOLN: INTRAVENOUS | Status: AC

## 2021-07-27 NOTE — ED Notes (Signed)
C-collar place per order at this time.  C-spine maintained.  Patient log-rolled to remove clothing

## 2021-07-27 NOTE — ED Provider Notes (Signed)
[facility] EMERGENCY DEPARTMENT Provider Note   CSN: [id number] Arrival date & time: 07/27/21  1921     History  Chief Complaint  Patient presents with   Head Injury   [place] is a 88 y.o. female.  Patient presents ER chief complaint of facial pain and headache.  She states she was trying to walk without a walker took a few steps and fell forwards.  This was witnessed by her daughter who was beside her.  Denies any loss of consciousness.  No recent illnesses no fever cough vomiting or diarrhea.  Patient on 324 mg of aspirin daily.       Home Medications Prior to Admission medications   Medication Sig Start Date End Date Taking? Authorizing Provider  acetaminophen (TYLENOL) 500 MG tablet Take 2 tablets (1,000 mg total) by mouth every 8 (eight) hours as needed for mild pain or headache. 05/16/17   Hongalgi, Lenis Dickinson, MD  albuterol (2.5 MG/3ML) 0.083% NEBU 3 mL, albuterol (5 MG/ML) 0.5% NEBU 0.5 mL Inhale 5 mg into the lungs every 6 (six) hours as needed (shortness of breath/wheezing).    [provider]  albuterol (PROVENTIL HFA;VENTOLIN HFA) 108 (90 BASE) MCG/ACT inhaler Inhale 1 puff into the lungs every 6 (six) hours as needed for wheezing or shortness of breath. 11/13/14   Dorothy Spark, MD  alendronate (FOSAMAX) 70 MG tablet Take 70 mg by mouth every Friday. 02/13/14   [provider]  allopurinol (ZYLOPRIM) 100 MG tablet Take 1 tablet (100 mg total) by mouth daily. 04/01/16   Veatrice Bourbon, MD  aspirin 325 MG EC tablet Take 325 mg by mouth daily.    [provider]  cholecalciferol (VITAMIN D3) 25 MCG (1000 UNIT) tablet Take 1,000 Units by mouth daily.    [provider]  empagliflozin (JARDIANCE) 10 MG TABS tablet Take 1 tablet (10 mg total) by mouth daily before breakfast. 04/21/21   Early Osmond, MD  famotidine (PEPCID) 20 MG tablet Take 20 mg by mouth 2 (two) times daily.    [provider]   ferrous sulfate 325 (65 FE) MG tablet Take 325 mg by mouth daily.     [provider]  fluticasone furoate-vilanterol (BREO ELLIPTA) 100-25 MCG/INH AEPB Inhale 1 puff into the lungs daily. 07/31/19   Martyn Ehrich, NP  furosemide (LASIX) 20 MG tablet Take 1 tablet (20 mg total) by mouth 2 (two) times daily. Patient not taking: Reported on 04/21/2021 08/21/19   Dorothy Spark, MD  furosemide (LASIX) 40 MG tablet Take 40 mg by mouth daily as needed for fluid or edema.    [provider]  HYDROcodone-acetaminophen (NORCO/VICODIN) 5-325 MG tablet Take 1 tablet by mouth every 4 (four) hours as needed. 01/12/21   Isla Pence, MD  ibuprofen (ADVIL) 400 MG tablet Take 400 mg by mouth every 6 (six) hours as needed for mild pain or moderate pain. Patient not taking: Reported on 04/21/2021    [provider]  methocarbamol (ROBAXIN) 500 MG tablet Take 500 mg by mouth 2 (two) times daily as needed for muscle spasms. Patient not taking: Reported on 04/21/2021    [provider]  montelukast (SINGULAIR) 10 MG tablet TAKE ONE TABLET BY MOUTH EVERYDAY AT BEDTIME Patient taking differently: Take 10 mg by mouth at bedtime. 11/25/20   Martyn Ehrich, NP  nitroGLYCERIN (NITROSTAT) 0.4 MG SL tablet Place 1 tablet (0.4 mg total) under the  tongue every 5 (five) minutes as needed for chest pain. 04/21/21   Early Osmond, MD  ondansetron (ZOFRAN-ODT) 8 MG disintegrating tablet Take 8 mg by mouth every 8 (eight) hours as needed for nausea or vomiting.  03/26/19   [provider]  sertraline (ZOLOFT) 50 MG tablet Take 50 mg by mouth daily.  03/15/12   [provider]  traMADol (ULTRAM) 50 MG tablet Take 50 mg by mouth daily as needed for pain. 07/08/20   [provider]      Allergies    Amoxicillin, Diltiazem hcl, Losartan potassium, Potassium-containing compounds, Sulfonamide derivatives, Zetia [ezetimibe], and Penicillins    Review of Systems    Review of Systems  Constitutional:  Negative for fever.  HENT:  Negative for ear pain.   Eyes:  Negative for pain.  Respiratory:  Negative for cough.   Cardiovascular:  Negative for chest pain.  Gastrointestinal:  Negative for abdominal pain.  Genitourinary:  Negative for flank pain.  Musculoskeletal:  Negative for back pain.  Skin:  Negative for rash.  Neurological:  Positive for headaches.    Physical Exam Updated Vital Signs BP (!) 155/59 (BP Location: Right Arm)   Pulse 61   Temp 97.8 F (36.6 C) (Oral)   Resp 16   SpO2 95%  Physical Exam Constitutional:      General: She is not in acute distress.    Appearance: Normal appearance.  HENT:     Head: Normocephalic.     Nose: Nose normal.  Eyes:     Extraocular Movements: Extraocular movements intact.  Cardiovascular:     Rate and Rhythm: Normal rate.  Pulmonary:     Effort: Pulmonary effort is normal.  Musculoskeletal:     Cervical back: Normal range of motion.     Comments: No C or T or L-spine tenderness noted.  Neurological:     General: No focal deficit present.     Mental Status: She is alert. Mental status is at baseline.     Comments: 5/5 strength all extremities.     ED Results / Procedures / Treatments   Labs (all labs ordered are listed, but only abnormal results are displayed) Labs Reviewed  CBC WITH DIFFERENTIAL/PLATELET - Abnormal; Notable for the following components:      Result Value   RBC 3.10 (*)    Hemoglobin 10.3 (*)    HCT 31.9 (*)    MCV 102.9 (*)    Platelets 146 (*)    All other components within normal limits  BASIC METABOLIC PANEL - Abnormal; Notable for the following components:   CO2 21 (*)    BUN 30 (*)    Creatinine, Ser 1.22 (*)    Calcium 8.8 (*)    GFR, Estimated 42 (*)    All other components within normal limits    EKG None  Radiology CT Maxillofacial Wo Contrast  Result Date: 07/27/2021 CLINICAL DATA:  Status post fall. EXAM: CT MAXILLOFACIAL WITHOUT  CONTRAST TECHNIQUE: Multidetector CT imaging of the maxillofacial structures was performed. Multiplanar CT image reconstructions were also generated. RADIATION DOSE REDUCTION: This exam was performed according to the departmental dose-optimization program which includes automated exposure control, adjustment of the mA and/or kV according to patient size and/or use of iterative reconstruction technique. COMPARISON:  None Available. FINDINGS: Osseous: An acute mildly displaced right-sided nasal bone fracture is seen. Acute displaced fracture deformities are seen involving the bilateral aspects of the anterior and posterior arches of C1. An additional fracture  is seen extending through the base of the dens and body of C2. Orbits: Negative. No traumatic or inflammatory finding. Sinuses: Clear. Soft tissues: There is mild bilateral paranasal soft tissue thickening. Marked severity soft tissue swelling is also seen, to the right of midline. An associated right frontal scalp hematoma is noted. Limited intracranial: No significant or unexpected finding. IMPRESSION: 1. Acute nasal bone fracture and fracture deformities of the C1 and C2 vertebral bodies, as described above. 2. Marked severity right frontal scalp soft tissue swelling, with an associated right frontal scalp hematoma. Electronically Signed   By: Virgina Norfolk M.D.   On: 07/27/2021 21:31   CT Cervical Spine Wo Contrast  Result Date: 07/27/2021 CLINICAL DATA:  Status post fall. EXAM: CT CERVICAL SPINE WITHOUT CONTRAST TECHNIQUE: Multidetector CT imaging of the cervical spine was performed without intravenous contrast. Multiplanar CT image reconstructions were also generated. RADIATION DOSE REDUCTION: This exam was performed according to the departmental dose-optimization program which includes automated exposure control, adjustment of the mA and/or kV according to patient size and/or use of iterative reconstruction technique. COMPARISON:  January 05, 2021  FINDINGS: Alignment: Normal. Skull base and vertebrae: An acute, nondisplaced fracture deformity is seen extending through the junction of the base of the dens and body of C2. Acute comminuted fracture deformities are also seen involving the bilateral aspects of the anterior and posterior arches of the C1 vertebral body. Soft tissues and spinal canal: No prevertebral fluid or swelling. No visible canal hematoma. Disc levels: Very mild, anterior osteophyte formation is seen at the levels of C5-C6 and C6-C7. There is marked severity narrowing of the anterior atlantoaxial articulation. Very mild multilevel intervertebral disc space narrowing is seen throughout the remainder of the cervical spine. Marked to severity multilevel bilateral facet joint hypertrophy is noted, left greater than right. Upper chest: Negative. Other: A chronic posterior third left rib fracture is seen. IMPRESSION: 1. Acute type 2 odontoid fracture (unstable). 2. Acute fractures involving the bilateral aspects of the anterior and posterior arches of the C1 vertebral body. 3. Marked to severity multilevel bilateral facet joint hypertrophy, left greater than right. 4. Very mild, anterior osteophyte formation at the levels of C5-C6 and C6-C7. 5. Chronic posterior third left rib fracture. Electronically Signed   By: Virgina Norfolk M.D.   On: 07/27/2021 21:08   CT Head Wo Contrast  Result Date: 07/27/2021 CLINICAL DATA:  Status post fall. EXAM: CT HEAD WITHOUT CONTRAST TECHNIQUE: Contiguous axial images were obtained from the base of the skull through the vertex without intravenous contrast. RADIATION DOSE REDUCTION: This exam was performed according to the departmental dose-optimization program which includes automated exposure control, adjustment of the mA and/or kV according to patient size and/or use of iterative reconstruction technique. COMPARISON:  None Available. FINDINGS: Brain: There is mild cerebral atrophy with widening of the  extra-axial spaces and ventricular dilatation. There are areas of decreased attenuation within the white matter tracts of the supratentorial brain, consistent with microvascular disease changes. Small, bilateral, chronic basal ganglia lacunar infarcts are seen. Vascular: No hyperdense vessel or unexpected calcification. Skull: Negative for acute skull fracture. Acute fracture deformity is seen extending through the junction of the base of the dens and body of the C2 vertebral body. Sinuses/Orbits: No acute finding. Other: Marked severity frontal scalp soft tissue swelling is seen, to the right of midline. An associated 3.3 cm x 1.6 cm right frontal scalp hematoma is noted. IMPRESSION: 1. Acute Type 2 odontoid fracture (unstable). 2. Marked severity right  frontal scalp soft tissue swelling, with an associated 3.3 cm x 1.6 cm right frontal scalp hematoma. 3. Generalized cerebral atrophy without acute intracranial abnormality. Electronically Signed   By: Virgina Norfolk M.D.   On: 07/27/2021 20:55   DG Knee Complete 4 Views Right  Result Date: 07/27/2021 CLINICAL DATA:  Fall EXAM: RIGHT KNEE - COMPLETE 4+ VIEW COMPARISON:  02/09/2018 FINDINGS: No fracture or malalignment. Tricompartment arthritis of the knee with severe joint space narrowing involving the medial joint space with moderate patellofemoral degenerative change. No sizable knee effusion. IMPRESSION: Tricompartment arthritis.  No acute osseous abnormality. Electronically Signed   By: Donavan Foil M.D.   On: 07/27/2021 20:28    Procedures Procedures    Medications Ordered in ED Medications  morphine (PF) 2 MG/ML injection 1 mg (1 mg Intravenous Given 07/27/21 1955)  morphine (PF) 2 MG/ML injection 2 mg (2 mg Intravenous Given 07/27/21 2129)    ED Course/ Medical Decision Making/ A&P                           Medical Decision Making Amount and/or Complexity of Data Reviewed Labs: ordered. Radiology: ordered.  Risk Prescription drug  management.   Review of record shows office visit March 42,023 for CHF.  Cardiac monitoring showing sinus rhythm.  Family at bedside obtain history from daughter.  Tenderness studies include CT imaging of the brain which shows no evidence of fracture or intracranial bleeding.  Facial CT shows nasal fracture and CT of the C-spine concerning for unstable odontoid fracture and C2 fracture.  Patient placed in a hard collar of the C-spine.  Consultation with neurosurgery discussed with Dr.Bergman, recommends continuing hard collar, and will be seen by neurosurgery.  Given patient's advanced age and frailty, unable to ambulate and take care of herself.  Family at bedside states that they are unable to take care of her with this injury.  Admitted to the hospitalist team.        Final Clinical Impression(s) / ED Diagnoses Final diagnoses:  Fall, initial encounter  Closed fracture of cervical vertebra, unspecified cervical vertebral level, initial encounter ([place])  Closed fracture of nasal bone, initial encounter    Rx / DC Orders ED Discharge Orders     None         Luna Fuse, MD 07/27/21 2231

## 2021-07-27 NOTE — ED Triage Notes (Signed)
Patient BIB EMS from home after a witnessed fall.  Pt was walking without her walker and became off balance.  She fell to floor, hitting forehead.  Has large hematoma to forehead, bleeding to bridge of nose, and two chipped teeth.  No reports of LOC.  Patient does not take blood thinners

## 2021-07-27 NOTE — H&P (Signed)
History and Physical    [name] SJG:[id number] DOB: 10/17/1927 DOA: 07/27/2021  PCP: [name], MD   Patient coming from: Home   Chief Complaint: Fall   HPI: [name] is a pleasant 88 y.o. female with medical history significant for atrial fibrillation no longer anticoagulated, symptomatic bradycardia with pacemaker, chronic diastolic CHF, hypertension, asthma, and CKD stage IIIb, now presenting to the emergency department after a fall.  Patient is accompanied by her daughter who is also her POA and assists with the history.  Patient was in her usual state and having an uneventful day when her daughter heard her shuffling without her walker, went to check on her, and witnessed her fall forward and strike her face on the ground.  There was no loss of consciousness.  She had some minor bleeding from her mouth and nose, and swelling over her forehead.  EMS was called and she was brought into the ED.  Patient is typically active, had cut up to 2 gallons of strawberries earlier today, was pulling some weeds yesterday, and folds close and helps clean around the house.  She sometimes tries to ambulate without her walker.  She has some memory deficits that her daughter feels are mild, but sometimes becomes confused when she is in pain or has an infection.  ED Course: Upon arrival to the ED, patient is found to be afebrile, saturating mid to upper 90s, and hypertensive.  Chemistry panel notable for creatinine 1.22.  CBC features a stable microcytic anemia and slight thrombocytopenia.  CT cervical spine is concerning for acute type II odontoid fracture and acute fractures of the bilateral aspects of the posterior arch of C1 vertebral body.  Maxillofacial CT reveals acute mildly displaced right nasal bone fracture, and head CT is negative for acute intracranial abnormality but notable for severe right frontal scalp soft tissue swelling with hematoma.  Neurosurgery was consulted by the  ED physician, hard cervical collar was placed, and the patient was treated with 2 doses of morphine.  Review of Systems:  All other systems reviewed and apart from HPI, are negative.  Past Medical History:  Diagnosis Date   Anemia    "I stay anemic"   Arthritis    Asthma    no problems recent   Cataract    Chronic diastolic CHF (congestive heart failure), NYHA class 2 ([place]) 03/19/2014   Echo 4/19: mod LVH, EF 55-60, no RWMA, Gr 1 DD, MAC, trivial MR, mod LAE, normal RVSF, PASP 19   CKD (chronic kidney disease), stage III (HCC)    Complication of anesthesia    CONFUSED AFTER KNEE SURGERY   Depression    Diverticulitis    DVT (deep venous thrombosis) (HCC)    from birth control, left groin   Essential hypertension    Fibromyalgia 10 y.a.   GERD (gastroesophageal reflux disease)    Gout    Hx of cardiovascular stress test    Lexiscan Myoview (8/15): apical attenuation artifact, no ischemia, EF 69% - low risk.   Hypercholesteremia    Odynophagia    Other abnormal glucose    PAF (paroxysmal atrial fibrillation) (HCC)    Personal history of other diseases of digestive system    Pneumonia    hx of   Presence of permanent cardiac pacemaker    Sinus bradycardia    Skin cancer 2014   skin R elbow & face   Symptomatic bradycardia 06/2016    Past Surgical History:  Procedure Laterality Date  ABDOMINAL HYSTERECTOMY     partial   APPENDECTOMY  ~55years ago   CHOLECYSTECTOMY  10/2005   Dr. Effie Shy   COLONOSCOPY W/ BIOPSIES  2005, 2011   Dr. Bing Plume, "balloon inside for last one at [facility]"   INTRAMEDULLARY (IM) NAIL INTERTROCHANTERIC Left 02/10/2018   Procedure: INTRAMEDULLARY (IM) NAIL INTERTROCHANTRIC;  Surgeon: Rod Can, MD;  Location: [place];  Service: Orthopedics;  Laterality: Left;   IR THORACENTESIS ASP PLEURAL SPACE W/IMG GUIDE  06/13/2017   KNEE ARTHROSCOPY Right    LAMINECTOMY  05/2007   foraminotomy, L4-5 laminectomy   LARYNGOSCOPY / BRONCHOSCOPY /  ESOPHAGOSCOPY  2010   Dr. Benjamine Mola   PACEMAKER IMPLANT N/A 07/01/2016   Procedure: Pacemaker Implant;  Surgeon: Evans Lance, MD;  Location: [place] [facility];  Service: Cardiovascular;  Laterality: N/A;   SKIN CANCER EXCISION Right ~2005   r elbow   TOTAL KNEE ARTHROPLASTY Left 04/04/2013   Procedure: LEFT TOTAL KNEE ARTHROPLASTY;  Surgeon: Tobi Bastos, MD;  Location: [facility];  Service: Orthopedics;  Laterality: Left;   TUBAL LIGATION     TUMOR REMOVAL Right ~ 20 years ago   tumor in between toes    Social History:   reports that she quit smoking about 48 years ago. Her smoking use included cigarettes. She has a 10.00 pack-year smoking history. She has never used smokeless tobacco. She reports that she does not drink alcohol and does not use drugs.  Allergies  Allergen Reactions   Amoxicillin Other (See Comments)    Very weak Has patient had a PCN reaction causing immediate rash, facial/tongue/throat swelling, SOB or lightheadedness with hypotension: no Has patient had a PCN reaction causing severe rash involving mucus membranes or skin necrosis: no Has patient had a PCN reaction that required hospitalization: no Has patient had a PCN reaction occurring within the last 10 years: no If all of the above answers are "NO", then may proceed with Cephalosporin use.    Diltiazem Hcl Other (See Comments)    Reaction unknown   Losartan Potassium Other (See Comments)    Potassium level increased drastically   Potassium-Containing Compounds Other (See Comments)    Pt is very sensitive to potassium products. Her Potassium rises very quickly and takes a long time to come down.   Sulfonamide Derivatives Other (See Comments)    Reaction unknown   Zetia [Ezetimibe] Other (See Comments)    Weakness   Penicillins Rash    DID THE REACTION INVOLVE: Swelling of the face/tongue/throat, SOB, or low BP? Unknown Sudden or severe rash/hives, skin peeling, or the inside of the mouth or nose?  Unknown Did it require medical treatment? Unknown When did it last happen?      unknown If all above answers are "NO", may proceed with cephalosporin use.     Family History  Problem Relation Age of Onset   Heart disease Mother    Hypertension Mother    Fainting Mother    Arrhythmia Mother    Diabetes Father    Stroke Father    Hypertension Father    Cancer Brother    Sudden death Brother    Heart attack Brother    Cancer Sister    Diabetes Sister    Asthma Sister      Prior to Admission medications   Medication Sig Start Date End Date Taking? Authorizing Provider  acetaminophen (TYLENOL) 500 MG tablet Take 2 tablets (1,000 mg total) by mouth every 8 (eight) hours as  needed for mild pain or headache. 05/16/17   Hongalgi, Lenis Dickinson, MD  albuterol (2.5 MG/3ML) 0.083% NEBU 3 mL, albuterol (5 MG/ML) 0.5% NEBU 0.5 mL Inhale 5 mg into the lungs every 6 (six) hours as needed (shortness of breath/wheezing).    [provider]  albuterol (PROVENTIL HFA;VENTOLIN HFA) 108 (90 BASE) MCG/ACT inhaler Inhale 1 puff into the lungs every 6 (six) hours as needed for wheezing or shortness of breath. 11/13/14   Dorothy Spark, MD  alendronate (FOSAMAX) 70 MG tablet Take 70 mg by mouth every Friday. 02/13/14   [provider]  allopurinol (ZYLOPRIM) 100 MG tablet Take 1 tablet (100 mg total) by mouth daily. 04/01/16   Veatrice Bourbon, MD  aspirin 325 MG EC tablet Take 325 mg by mouth daily.    [provider]  cholecalciferol (VITAMIN D3) 25 MCG (1000 UNIT) tablet Take 1,000 Units by mouth daily.    [provider]  empagliflozin (JARDIANCE) 10 MG TABS tablet Take 1 tablet (10 mg total) by mouth daily before breakfast. 04/21/21   Early Osmond, MD  famotidine (PEPCID) 20 MG tablet Take 20 mg by mouth 2 (two) times daily.    [provider]  ferrous sulfate 325 (65 FE) MG tablet Take 325 mg by mouth daily.     [provider]  fluticasone  furoate-vilanterol (BREO ELLIPTA) 100-25 MCG/INH AEPB Inhale 1 puff into the lungs daily. 07/31/19   Martyn Ehrich, NP  furosemide (LASIX) 20 MG tablet Take 1 tablet (20 mg total) by mouth 2 (two) times daily. Patient not taking: Reported on 04/21/2021 08/21/19   Dorothy Spark, MD  furosemide (LASIX) 40 MG tablet Take 40 mg by mouth daily as needed for fluid or edema.    [provider]  HYDROcodone-acetaminophen (NORCO/VICODIN) 5-325 MG tablet Take 1 tablet by mouth every 4 (four) hours as needed. 01/12/21   Isla Pence, MD  ibuprofen (ADVIL) 400 MG tablet Take 400 mg by mouth every 6 (six) hours as needed for mild pain or moderate pain. Patient not taking: Reported on 04/21/2021    [provider]  methocarbamol (ROBAXIN) 500 MG tablet Take 500 mg by mouth 2 (two) times daily as needed for muscle spasms. Patient not taking: Reported on 04/21/2021    [provider]  montelukast (SINGULAIR) 10 MG tablet TAKE ONE TABLET BY MOUTH EVERYDAY AT BEDTIME Patient taking differently: Take 10 mg by mouth at bedtime. 11/25/20   Martyn Ehrich, NP  nitroGLYCERIN (NITROSTAT) 0.4 MG SL tablet Place 1 tablet (0.4 mg total) under the tongue every 5 (five) minutes as needed for chest pain. 04/21/21   Early Osmond, MD  ondansetron (ZOFRAN-ODT) 8 MG disintegrating tablet Take 8 mg by mouth every 8 (eight) hours as needed for nausea or vomiting.  03/26/19   [provider]  sertraline (ZOLOFT) 50 MG tablet Take 50 mg by mouth daily.  03/15/12   [provider]  traMADol (ULTRAM) 50 MG tablet Take 50 mg by mouth daily as needed for pain. 07/08/20   [provider]    Physical Exam: Vitals:   07/27/21 2230 07/27/21 2245 07/27/21 2300 07/27/21 2315  BP: (!) 159/74 (!) 165/66 (!) 170/72 (!) 163/66  Pulse: 60 (!) 59 (!) 57 (!) 59  Resp:  16  16  Temp:      TempSrc:      SpO2: 97% 95% 97% 92%    Constitutional: NAD, calm  Eyes: PERTLA,  periorbital  ecchymoses  ENMT: Mucous membranes are moist. Posterior pharynx clear of any exudate or lesions.   Neck: In hard cervical collar   Respiratory: no wheezing, no crackles. No accessory muscle use.  Cardiovascular: S1 & S2 heard, regular rate and rhythm. No extremity edema.  Abdomen: No distension, no tenderness, soft. Bowel sounds active.  Musculoskeletal: no clubbing / cyanosis. No joint deformity upper and lower extremities.   Skin: no significant rashes, lesions, ulcers. Warm, dry, well-perfused. Neurologic: Gross hearing deficit, CN 2-12 grossly intact otherwise. Sensation to light touch intact. Moving all extremities. Alert and oriented to person, place, and situation.  Psychiatric: Pleasant. Cooperative.    Labs and Imaging on Admission: I have personally reviewed following labs and imaging studies  CBC: Recent Labs  Lab 07/27/21 1926  WBC 5.1  NEUTROABS 2.9  HGB 10.3*  HCT 31.9*  MCV 102.9*  PLT 564*   Basic Metabolic Panel: Recent Labs  Lab 07/27/21 1926  NA 141  K 5.0  CL 110  CO2 21*  GLUCOSE 93  BUN 30*  CREATININE 1.22*  CALCIUM 8.8*   GFR: CrCl cannot be calculated (Unknown ideal weight.). Liver Function Tests: No results for input(s): "AST", "ALT", "ALKPHOS", "BILITOT", "PROT", "ALBUMIN" in the last 168 hours. No results for input(s): "LIPASE", "AMYLASE" in the last 168 hours. No results for input(s): "AMMONIA" in the last 168 hours. Coagulation Profile: No results for input(s): "INR", "PROTIME" in the last 168 hours. Cardiac Enzymes: No results for input(s): "CKTOTAL", "CKMB", "CKMBINDEX", "TROPONINI" in the last 168 hours. BNP (last 3 results) No results for input(s): "PROBNP" in the last 8760 hours. HbA1C: No results for input(s): "HGBA1C" in the last 72 hours. CBG: No results for input(s): "GLUCAP" in the last 168 hours. Lipid Profile: No results for input(s): "CHOL", "HDL", "LDLCALC", "TRIG", "CHOLHDL", "LDLDIRECT" in the last 72 hours. Thyroid  Function Tests: No results for input(s): "TSH", "T4TOTAL", "FREET4", "T3FREE", "THYROIDAB" in the last 72 hours. Anemia Panel: No results for input(s): "VITAMINB12", "FOLATE", "FERRITIN", "TIBC", "IRON", "RETICCTPCT" in the last 72 hours. Urine analysis:    Component Value Date/Time   COLORURINE YELLOW 11/22/2020 0742   APPEARANCEUR HAZY (A) 11/22/2020 0742   LABSPEC 1.019 11/22/2020 0742   PHURINE 6.0 11/22/2020 0742   GLUCOSEU NEGATIVE 11/22/2020 0742   HGBUR SMALL (A) 11/22/2020 0742   BILIRUBINUR NEGATIVE 11/22/2020 0742   KETONESUR NEGATIVE 11/22/2020 0742   PROTEINUR 100 (A) 11/22/2020 0742   UROBILINOGEN 0.2 07/20/2013 1457   NITRITE NEGATIVE 11/22/2020 0742   LEUKOCYTESUR NEGATIVE 11/22/2020 0742   Sepsis Labs: _0 (procalcitonin:4,lacticidven:4) )No results found for this or any previous visit (from the past 240 hour(s)).   Radiological Exams on Admission: CT Maxillofacial Wo Contrast  Result Date: 07/27/2021 CLINICAL DATA:  Status post fall. EXAM: CT MAXILLOFACIAL WITHOUT CONTRAST TECHNIQUE: Multidetector CT imaging of the maxillofacial structures was performed. Multiplanar CT image reconstructions were also generated. RADIATION DOSE REDUCTION: This exam was performed according to the departmental dose-optimization program which includes automated exposure control, adjustment of the mA and/or kV according to patient size and/or use of iterative reconstruction technique. COMPARISON:  None Available. FINDINGS: Osseous: An acute mildly displaced right-sided nasal bone fracture is seen. Acute displaced fracture deformities are seen involving the bilateral aspects of the anterior and posterior arches of C1. An additional fracture is seen extending through the base of the dens and body of C2. Orbits: Negative. No traumatic or inflammatory finding. Sinuses: Clear. Soft tissues: There is mild bilateral paranasal soft tissue  thickening. Marked severity soft tissue swelling is also  seen, to the right of midline. An associated right frontal scalp hematoma is noted. Limited intracranial: No significant or unexpected finding. IMPRESSION: 1. Acute nasal bone fracture and fracture deformities of the C1 and C2 vertebral bodies, as described above. 2. Marked severity right frontal scalp soft tissue swelling, with an associated right frontal scalp hematoma. Electronically Signed   By: Virgina Norfolk M.D.   On: 07/27/2021 21:31   CT Cervical Spine Wo Contrast  Result Date: 07/27/2021 CLINICAL DATA:  Status post fall. EXAM: CT CERVICAL SPINE WITHOUT CONTRAST TECHNIQUE: Multidetector CT imaging of the cervical spine was performed without intravenous contrast. Multiplanar CT image reconstructions were also generated. RADIATION DOSE REDUCTION: This exam was performed according to the departmental dose-optimization program which includes automated exposure control, adjustment of the mA and/or kV according to patient size and/or use of iterative reconstruction technique. COMPARISON:  January 05, 2021 FINDINGS: Alignment: Normal. Skull base and vertebrae: An acute, nondisplaced fracture deformity is seen extending through the junction of the base of the dens and body of C2. Acute comminuted fracture deformities are also seen involving the bilateral aspects of the anterior and posterior arches of the C1 vertebral body. Soft tissues and spinal canal: No prevertebral fluid or swelling. No visible canal hematoma. Disc levels: Very mild, anterior osteophyte formation is seen at the levels of C5-C6 and C6-C7. There is marked severity narrowing of the anterior atlantoaxial articulation. Very mild multilevel intervertebral disc space narrowing is seen throughout the remainder of the cervical spine. Marked to severity multilevel bilateral facet joint hypertrophy is noted, left greater than right. Upper chest: Negative. Other: A chronic posterior third left rib fracture is seen. IMPRESSION: 1. Acute type 2  odontoid fracture (unstable). 2. Acute fractures involving the bilateral aspects of the anterior and posterior arches of the C1 vertebral body. 3. Marked to severity multilevel bilateral facet joint hypertrophy, left greater than right. 4. Very mild, anterior osteophyte formation at the levels of C5-C6 and C6-C7. 5. Chronic posterior third left rib fracture. Electronically Signed   By: Virgina Norfolk M.D.   On: 07/27/2021 21:08   CT Head Wo Contrast  Result Date: 07/27/2021 CLINICAL DATA:  Status post fall. EXAM: CT HEAD WITHOUT CONTRAST TECHNIQUE: Contiguous axial images were obtained from the base of the skull through the vertex without intravenous contrast. RADIATION DOSE REDUCTION: This exam was performed according to the departmental dose-optimization program which includes automated exposure control, adjustment of the mA and/or kV according to patient size and/or use of iterative reconstruction technique. COMPARISON:  None Available. FINDINGS: Brain: There is mild cerebral atrophy with widening of the extra-axial spaces and ventricular dilatation. There are areas of decreased attenuation within the white matter tracts of the supratentorial brain, consistent with microvascular disease changes. Small, bilateral, chronic basal ganglia lacunar infarcts are seen. Vascular: No hyperdense vessel or unexpected calcification. Skull: Negative for acute skull fracture. Acute fracture deformity is seen extending through the junction of the base of the dens and body of the C2 vertebral body. Sinuses/Orbits: No acute finding. Other: Marked severity frontal scalp soft tissue swelling is seen, to the right of midline. An associated 3.3 cm x 1.6 cm right frontal scalp hematoma is noted. IMPRESSION: 1. Acute Type 2 odontoid fracture (unstable). 2. Marked severity right frontal scalp soft tissue swelling, with an associated 3.3 cm x 1.6 cm right frontal scalp hematoma. 3. Generalized cerebral atrophy without acute  intracranial abnormality. Electronically Signed   By:  Virgina Norfolk M.D.   On: 07/27/2021 20:55   DG Knee Complete 4 Views Right  Result Date: 07/27/2021 CLINICAL DATA:  Fall EXAM: RIGHT KNEE - COMPLETE 4+ VIEW COMPARISON:  02/09/2018 FINDINGS: No fracture or malalignment. Tricompartment arthritis of the knee with severe joint space narrowing involving the medial joint space with moderate patellofemoral degenerative change. No sizable knee effusion. IMPRESSION: Tricompartment arthritis.  No acute osseous abnormality. Electronically Signed   By: Donavan Foil M.D.   On: 07/27/2021 20:28     Assessment/Plan   1. Type II odontoid fracture; C1 fractures  - Appreciate neurosurgery consultation  - Continue collar, spine precautions, pain-control, and follow-up on neurosurgery recs   2. Nasal bone fracture  - Mildly displaced right nasal bone fracture noted in ED  - ENT follow-up recommended    3. Chronic diastolic CHF  - Appears compensated  - Has been off diuretics  - Start gentle IVF hydration while NPO, monitor volume status    4. CKD IIIb  - SCr is 1.22 on admission, appears to be her baseline  - Renally-dose medications, monitor    5. PAF  - No longer anticoagulated d/t falls, hold aspirin pending neurosurgery consultation    6. Asthma  - No cough or wheezing on admission  - Continue inhalers   7. Anemia, thrombocytopenia  - Appears stable     DVT prophylaxis: SCDs  Code Status: DNR, confirmed on admission  Level of Care: Level of care: Med-Surg Family Communication: Daughter, Carollee Herter [facility]) at bedside  Disposition Plan:  Patient is from: home  Anticipated d/c is to: TBD Anticipated d/c date is: 07/30/21  Patient currently: Pending neurosurgery consult, pain-control  Consults called: neurosurgery  Admission status: Inpatient     Vianne Bulls, MD [facility]  07/27/2021, 11:49 PM

## 2021-07-27 NOTE — ED Notes (Signed)
Patient c/o HA.  Requesting pain medication

## 2021-07-27 NOTE — ED Notes (Signed)
Patient transported to CT 

## 2021-07-27 NOTE — ED Notes (Signed)
Patient remains in radiology at this time.

## 2021-07-28 DIAGNOSIS — S12110A Anterior displaced Type II dens fracture, initial encounter for closed fracture: Secondary | ICD-10-CM | POA: Diagnosis not present

## 2021-07-28 DIAGNOSIS — I5032 Chronic diastolic (congestive) heart failure: Secondary | ICD-10-CM | POA: Diagnosis not present

## 2021-07-28 DIAGNOSIS — J45909 Unspecified asthma, uncomplicated: Secondary | ICD-10-CM | POA: Diagnosis not present

## 2021-07-28 MED ORDER — ALBUTEROL SULFATE HFA 108 (90 BASE) MCG/ACT IN AERS: 1.0000 | INHALATION_SPRAY | RESPIRATORY_TRACT | Status: DC | PRN

## 2021-07-28 MED ORDER — FAMOTIDINE 20 MG PO TABS
20.0000 mg | ORAL_TABLET | Freq: Two times a day (BID) | ORAL | Status: DC
  Administered 2021-07-28 – 2021-08-03 (×11): 20 mg via ORAL
  Filled 2021-07-28 (×12): qty 1

## 2021-07-28 MED ORDER — OXYCODONE-ACETAMINOPHEN 5-325 MG PO TABS
1.0000 | ORAL_TABLET | ORAL | Status: DC | PRN
  Administered 2021-07-28 (×2): 1 via ORAL
  Administered 2021-07-29 (×3): 2 via ORAL
  Administered 2021-07-29: 1 via ORAL
  Filled 2021-07-28 (×2): qty 2
  Filled 2021-07-28: qty 1
  Filled 2021-07-28: qty 2
  Filled 2021-07-28 (×2): qty 1

## 2021-07-28 MED ORDER — SERTRALINE HCL 50 MG PO TABS
50.0000 mg | ORAL_TABLET | Freq: Every day | ORAL | Status: DC
  Administered 2021-07-28 – 2021-08-03 (×6): 50 mg via ORAL
  Filled 2021-07-28 (×7): qty 1

## 2021-07-28 MED ORDER — HYDRALAZINE HCL 25 MG PO TABS
25.0000 mg | ORAL_TABLET | Freq: Three times a day (TID) | ORAL | Status: DC | PRN
  Administered 2021-08-02 – 2021-08-03 (×2): 25 mg via ORAL
  Filled 2021-07-28 (×2): qty 1

## 2021-07-28 MED ORDER — MONTELUKAST SODIUM 10 MG PO TABS
10.0000 mg | ORAL_TABLET | Freq: Every day | ORAL | Status: DC
  Administered 2021-07-28 – 2021-08-02 (×5): 10 mg via ORAL
  Filled 2021-07-28 (×6): qty 1

## 2021-07-28 NOTE — Evaluation (Signed)
Physical Therapy Evaluation Patient Details Name: [name] MRN: [id number] DOB: [birth year] Today's Date: 07/28/2021  History of Present Illness  Pt is a 88 y.o. F who presents after a fall. CT scan of c-spine revealed type 2 odontoid fractures. Also noted mildly displaced right nasal bone fracture. CT head negative for acute abnormality. Significant PMH: atrial fibrillation, symptomatic bradycardia with pacemaker, chronic diastolic CHF, HTN, asthma, CKD stage IIIb.  Clinical Impression  PTA, pt lives with her daughter and granddaughter, uses a RW for mobility and requires assist for ADL's. Pt premedicated prior to session, however, still is in a considerable amount of pain with any movement. Pt requiring two person moderate assist for rolling and transfer to edge of bed deferred due to sharp increase in pain. Returned to supine and readjusted cervical brace. Pt displays cognitive deficits (currently A&O to self only) and generalized weakness. Suspect pt will need SNF at discharge. Will continue to progress as tolerated.     Recommendations for follow up therapy are one component of a multi-disciplinary discharge planning process, led by the attending physician.  Recommendations may be updated based on patient status, additional functional criteria and insurance authorization.  Follow Up Recommendations Skilled nursing-short term rehab (<3 hours/day)    Assistance Recommended at Discharge Frequent or constant Supervision/Assistance  Patient can return home with the following  Two people to help with walking and/or transfers;A lot of help with bathing/dressing/bathroom    Equipment Recommendations Hospital bed  Recommendations for Other Services       Functional Status Assessment Patient has had a recent decline in their functional status and demonstrates the ability to make significant improvements in function in a reasonable and predictable amount of time.     Precautions /  Restrictions Precautions Precautions: Cervical;Fall Precaution Booklet Issued: No Required Braces or Orthoses: Cervical Brace Cervical Brace: Hard collar;At all times Restrictions Weight Bearing Restrictions: No      Mobility  Bed Mobility Overal bed mobility: Needs Assistance Bed Mobility: Rolling Rolling: Mod assist, +2 for physical assistance         General bed mobility comments: Rolling gently with use of bed pad to right; cues for bending LLE and reaching with LUE for rail. Attempted to bring BLE's to edge of bed, but pt with sharp increase in pain, so returned to supine    Transfers                   General transfer comment: deferred due to pain    Ambulation/Gait                  Stairs            Wheelchair Mobility    Modified Rankin (Stroke Patients Only)       Balance                                             Pertinent Vitals/Pain Pain Assessment Pain Assessment: Faces Faces Pain Scale: Hurts worst Pain Location: head/neck (pt premedicated with IV pain medication) Pain Descriptors / Indicators: Discomfort, Grimacing, Guarding, Moaning Pain Intervention(s): Limited activity within patient's tolerance, Monitored during session, Premedicated before session, Repositioned    Home Living Family/patient expects to be discharged to:: Private residence Living Arrangements: Children;Other relatives (daughter, granddaughter) Available Help at Discharge: Family;Available 24 hours/day Type of Home: House Home Access: Stairs to  enter   Entrance Stairs-Number of Steps: 2   Home Layout: One level Home Equipment: Conservation officer, nature (2 wheels);Wheelchair - manual;Cane - quad;BSC/3in1;Shower seat Additional Comments: Discharged from SNF 2 months ago. Aide 3 days/wk    Prior Function Prior Level of Function : Needs assist             Mobility Comments: uses walker ADLs Comments: assist for ADL's     Hand  Dominance   Dominant Hand: Right    Extremity/Trunk Assessment   Upper Extremity Assessment Upper Extremity Assessment: RUE deficits/detail;LUE deficits/detail RUE Deficits / Details: Shoulder flexion AROM WFL LUE Deficits / Details: Shoulder flexion AROM to ~100 degrees, passively [facility]    Lower Extremity Assessment Lower Extremity Assessment: Generalized weakness       Communication   Communication: HOH  Cognition Arousal/Alertness: Awake/alert Behavior During Therapy: WFL for tasks assessed/performed Overall Cognitive Status: Impaired/Different from baseline Area of Impairment: Orientation, Following commands, Memory, Awareness                 Orientation Level: Disoriented to, Place, Time, Situation   Memory: Decreased recall of precautions, Decreased short-term memory Following Commands: Follows one step commands consistently   Awareness: Intellectual   General Comments: Pt oriented to self only; able to state name, birthday and age. Pt states she is at her daughter's house and the year is 2002. Pt granddaughter reports pt has STM deficits at baseline. Overall follows one step commands and smiles pleasantly        General Comments      Exercises     Assessment/Plan    PT Assessment Patient needs continued PT services  PT Problem List Decreased strength;Decreased activity tolerance;Decreased balance;Decreased mobility;Decreased cognition;Decreased safety awareness;Pain       PT Treatment Interventions DME instruction;Gait training;Functional mobility training;Therapeutic activities;Therapeutic exercise;Balance training;Patient/family education    PT Goals (Current goals can be found in the Care Plan section)  Acute Rehab PT Goals Patient Stated Goal: less pain PT Goal Formulation: With patient/family Time For Goal Achievement: 08/11/21 Potential to Achieve Goals: Fair    Frequency Min 3X/week     Co-evaluation               AM-PAC PT "6  Clicks" Mobility  Outcome Measure Help needed turning from your back to your side while in a flat bed without using bedrails?: A Lot Help needed moving from lying on your back to sitting on the side of a flat bed without using bedrails?: Total Help needed moving to and from a bed to a chair (including a wheelchair)?: Total Help needed standing up from a chair using your arms (e.g., wheelchair or bedside chair)?: Total Help needed to walk in hospital room?: Total Help needed climbing 3-5 steps with a railing? : Total 6 Click Score: 7    End of Session Equipment Utilized During Treatment: Cervical collar Activity Tolerance: Patient limited by pain Patient left: in bed;with call bell/phone within reach;with bed alarm set;with family/visitor present Nurse Communication: Mobility status PT Visit Diagnosis: History of falling (Z91.81);Muscle weakness (generalized) (M62.81);Pain Pain - part of body:  (neck)    Time: 1410-1435 PT Time Calculation (min) (ACUTE ONLY): 25 min   Charges:   PT Evaluation $PT Eval Moderate Complexity: 1 Mod PT Treatments $Therapeutic Activity: 8-22 mins        [name], PT, DPT Acute Rehabilitation Services Office [phone]   [name] 07/28/2021, 3:27 PM

## 2021-07-28 NOTE — TOC CAGE-AID Note (Signed)
Transition of Care [facility]) - CAGE-AID Screening   Patient Details  Name: [name] MRN: [id number] Date of Birth: 1927-07-03  Clinical Narrative:  Patient presented to the hospital after a fall. Patient denies any alcohol or illicit substance abuse. No need for substance abuse education at this time.  CAGE-AID Screening:    Have You Ever Felt You Ought to Cut Down on Your Drinking or Drug Use?: No Have People Annoyed You By Critizing Your Drinking Or Drug Use?: No Have You Felt Bad Or Guilty About Your Drinking Or Drug Use?: No Have You Ever Had a Drink or Used Drugs First Thing In The Morning to Steady Your Nerves or to Get Rid of a Hangover?: No CAGE-AID Score: 0  Substance Abuse Education Offered: No

## 2021-07-28 NOTE — Consult Note (Signed)
Providing Compassionate, Quality Care - Together   Reason for Consult: Type 2 Odontoid fracture Referring Physician: Dr. Ginger Organ [name] is an 88 y.o. female.  HPI: [name] is an active 88 year old female with history outlined below. She was brought to the emergency department from home by EMS following a ground level fall that was witnessed by her daughter. She was walking with her walker when she became off-balance. She fell forward, striking her forehead. She complained of head and facial pain in the ED. CT scan of her c-spine revealed a type 2 odontoid fracture, acute fractures involving the bilateral aspects of the anterior and posterior arches of the C1 vertebral body, and chronic degenerative changes. CT head was negative for acute abnormality. She presently complains of facial pain and abdominal pain. She denies numbness, tingling, or weakness of her upper or lower extremities. She denies bowel or bladder dysfunction. She denies changes in vision or speech. Se denies nausea, vomiting, or diarrhea. Neurosurgery was consulted for further evaluation and recommendations regarding the patient's odontoid fracture.  Past Medical History:  Diagnosis Date   Anemia    "I stay anemic"   Arthritis    Asthma    no problems recent   Cataract    Chronic diastolic CHF (congestive heart failure), NYHA class 2 ([place]) 03/19/2014   Echo 4/19: mod LVH, EF 55-60, no RWMA, Gr 1 DD, MAC, trivial MR, mod LAE, normal RVSF, PASP 19   CKD (chronic kidney disease), stage III (HCC)    Complication of anesthesia    CONFUSED AFTER KNEE SURGERY   Depression    Diverticulitis    DVT (deep venous thrombosis) (HCC)    from birth control, left groin   Essential hypertension    Fibromyalgia 10 y.a.   GERD (gastroesophageal reflux disease)    Gout    Hx of cardiovascular stress test    Lexiscan Myoview (8/15): apical attenuation artifact, no ischemia, EF 69% - low risk.   Hypercholesteremia     Odynophagia    Other abnormal glucose    PAF (paroxysmal atrial fibrillation) (HCC)    Personal history of other diseases of digestive system    Pneumonia    hx of   Presence of permanent cardiac pacemaker    Sinus bradycardia    Skin cancer 2014   skin R elbow & face   Symptomatic bradycardia 06/2016    Past Surgical History:  Procedure Laterality Date   ABDOMINAL HYSTERECTOMY     partial   APPENDECTOMY  ~55years ago   CHOLECYSTECTOMY  10/2005   Dr. Effie Shy   COLONOSCOPY W/ BIOPSIES  2005, 2011   Dr. Bing Plume, "balloon inside for last one at [facility]"   INTRAMEDULLARY (IM) NAIL INTERTROCHANTERIC Left 02/10/2018   Procedure: INTRAMEDULLARY (IM) NAIL INTERTROCHANTRIC;  Surgeon: Rod Can, MD;  Location: [facility];  Service: Orthopedics;  Laterality: Left;   IR THORACENTESIS ASP PLEURAL SPACE W/IMG GUIDE  06/13/2017   KNEE ARTHROSCOPY Right    LAMINECTOMY  05/2007   foraminotomy, L4-5 laminectomy   LARYNGOSCOPY / BRONCHOSCOPY / ESOPHAGOSCOPY  2010   Dr. Benjamine Mola   PACEMAKER IMPLANT N/A 07/01/2016   Procedure: Pacemaker Implant;  Surgeon: Evans Lance, MD;  Location: [facility];  Service: Cardiovascular;  Laterality: N/A;   SKIN CANCER EXCISION Right ~2005   r elbow   TOTAL KNEE ARTHROPLASTY Left 04/04/2013   Procedure: LEFT TOTAL KNEE ARTHROPLASTY;  Surgeon: Tobi Bastos, MD;  Location:  [facility];  Service: Orthopedics;  Laterality: Left;   TUBAL LIGATION     TUMOR REMOVAL Right ~ 20 years ago   tumor in between toes    Family History  Problem Relation Age of Onset   Heart disease Mother    Hypertension Mother    Fainting Mother    Arrhythmia Mother    Diabetes Father    Stroke Father    Hypertension Father    Cancer Brother    Sudden death Brother    Heart attack Brother    Cancer Sister    Diabetes Sister    Asthma Sister     Social History:  reports that she quit smoking about 48 years ago. Her smoking use included cigarettes. She has a 10.00  pack-year smoking history. She has never used smokeless tobacco. She reports that she does not drink alcohol and does not use drugs.  Allergies:  Allergies  Allergen Reactions   Sulfonamide Derivatives Shortness Of Breath and Rash   Amoxicillin Other (See Comments)    Very weak Has patient had a PCN reaction causing immediate rash, facial/tongue/throat swelling, SOB or lightheadedness with hypotension: no Has patient had a PCN reaction causing severe rash involving mucus membranes or skin necrosis: no Has patient had a PCN reaction that required hospitalization: no Has patient had a PCN reaction occurring within the last 10 years: no If all of the above answers are "NO", then may proceed with Cephalosporin use.    Diltiazem Hcl Other (See Comments)    Reaction unknown   Losartan Potassium Other (See Comments)    Potassium level increased drastically   Potassium-Containing Compounds Other (See Comments)    Pt is very sensitive to potassium products. Her Potassium rises very quickly and takes a long time to come down.   Zetia [Ezetimibe] Other (See Comments)    Weakness   Penicillins Rash    DID THE REACTION INVOLVE: Swelling of the face/tongue/throat, SOB, or low BP? Unknown Sudden or severe rash/hives, skin peeling, or the inside of the mouth or nose? yes Did it require medical treatment? Unknown When did it last happen?      unknown If all above answers are "NO", may proceed with cephalosporin use.     Medications: I have reviewed the patient's current medications.  Results for orders placed or performed during the hospital encounter of 07/27/21 (from the past 48 hour(s))  CBC with Differential     Status: Abnormal   Collection Time: 07/27/21  7:26 PM  Result Value Ref Range   WBC 5.1 4.0 - 10.5 K/uL   RBC 3.10 (L) 3.87 - 5.11 MIL/uL   Hemoglobin 10.3 (L) 12.0 - 15.0 g/dL   HCT 31.9 (L) 36.0 - 46.0 %   MCV 102.9 (H) 80.0 - 100.0 fL   MCH 33.2 26.0 - 34.0 pg   MCHC 32.3  30.0 - 36.0 g/dL   RDW 14.3 11.5 - 15.5 %   Platelets 146 (L) 150 - 400 K/uL   nRBC 0.0 0.0 - 0.2 %   Neutrophils Relative % 58 %   Neutro Abs 2.9 1.7 - 7.7 K/uL   Lymphocytes Relative 32 %   Lymphs Abs 1.6 0.7 - 4.0 K/uL   Monocytes Relative 7 %   Monocytes Absolute 0.3 0.1 - 1.0 K/uL   Eosinophils Relative 2 %   Eosinophils Absolute 0.1 0.0 - 0.5 K/uL   Basophils Relative 0 %   Basophils Absolute 0.0 0.0 - 0.1 K/uL  Immature Granulocytes 1 %   Abs Immature Granulocytes 0.03 0.00 - 0.07 K/uL    Comment: Performed at [facility], [place] [address], [place], [place] [address]  Basic metabolic panel     Status: Abnormal   Collection Time: 07/27/21  7:26 PM  Result Value Ref Range   Sodium 141 135 - 145 mmol/L   Potassium 5.0 3.5 - 5.1 mmol/L   Chloride 110 98 - 111 mmol/L   CO2 21 (L) 22 - 32 mmol/L   Glucose, Bld 93 70 - 99 mg/dL    Comment: Glucose reference range applies only to samples taken after fasting for at least 8 hours.   BUN 30 (H) 8 - 23 mg/dL   Creatinine, Ser 1.22 (H) 0.44 - 1.00 mg/dL   Calcium 8.8 (L) 8.9 - 10.3 mg/dL   GFR, Estimated 42 (L) >60 mL/min    Comment: (NOTE) Calculated using the CKD-EPI Creatinine Equation (2021)    Anion gap 10 5 - 15    Comment: Performed at [facility] [address] [place], [place] [address]    CT Maxillofacial Wo Contrast  Result Date: 07/27/2021 CLINICAL DATA:  Status post fall. EXAM: CT MAXILLOFACIAL WITHOUT CONTRAST TECHNIQUE: Multidetector CT imaging of the maxillofacial structures was performed. Multiplanar CT image reconstructions were also generated. RADIATION DOSE REDUCTION: This exam was performed according to the departmental dose-optimization program which includes automated exposure control, adjustment of the mA and/or kV according to patient size and/or use of iterative reconstruction technique. COMPARISON:  None Available. FINDINGS: Osseous: An acute mildly displaced right-sided nasal bone fracture  is seen. Acute displaced fracture deformities are seen involving the bilateral aspects of the anterior and posterior arches of C1. An additional fracture is seen extending through the base of the dens and body of C2. Orbits: Negative. No traumatic or inflammatory finding. Sinuses: Clear. Soft tissues: There is mild bilateral paranasal soft tissue thickening. Marked severity soft tissue swelling is also seen, to the right of midline. An associated right frontal scalp hematoma is noted. Limited intracranial: No significant or unexpected finding. IMPRESSION: 1. Acute nasal bone fracture and fracture deformities of the C1 and C2 vertebral bodies, as described above. 2. Marked severity right frontal scalp soft tissue swelling, with an associated right frontal scalp hematoma. Electronically Signed   By: Virgina Norfolk M.D.   On: 07/27/2021 21:31   CT Cervical Spine Wo Contrast  Result Date: 07/27/2021 CLINICAL DATA:  Status post fall. EXAM: CT CERVICAL SPINE WITHOUT CONTRAST TECHNIQUE: Multidetector CT imaging of the cervical spine was performed without intravenous contrast. Multiplanar CT image reconstructions were also generated. RADIATION DOSE REDUCTION: This exam was performed according to the departmental dose-optimization program which includes automated exposure control, adjustment of the mA and/or kV according to patient size and/or use of iterative reconstruction technique. COMPARISON:  January 05, 2021 FINDINGS: Alignment: Normal. Skull base and vertebrae: An acute, nondisplaced fracture deformity is seen extending through the junction of the base of the dens and body of C2. Acute comminuted fracture deformities are also seen involving the bilateral aspects of the anterior and posterior arches of the C1 vertebral body. Soft tissues and spinal canal: No prevertebral fluid or swelling. No visible canal hematoma. Disc levels: Very mild, anterior osteophyte formation is seen at the levels of C5-C6 and C6-C7.  There is marked severity narrowing of the anterior atlantoaxial articulation. Very mild multilevel intervertebral disc space narrowing is seen throughout the remainder of the cervical spine. Marked to  severity multilevel bilateral facet joint hypertrophy is noted, left greater than right. Upper chest: Negative. Other: A chronic posterior third left rib fracture is seen. IMPRESSION: 1. Acute type 2 odontoid fracture (unstable). 2. Acute fractures involving the bilateral aspects of the anterior and posterior arches of the C1 vertebral body. 3. Marked to severity multilevel bilateral facet joint hypertrophy, left greater than right. 4. Very mild, anterior osteophyte formation at the levels of C5-C6 and C6-C7. 5. Chronic posterior third left rib fracture. Electronically Signed   By: Virgina Norfolk M.D.   On: 07/27/2021 21:08   CT Head Wo Contrast  Result Date: 07/27/2021 CLINICAL DATA:  Status post fall. EXAM: CT HEAD WITHOUT CONTRAST TECHNIQUE: Contiguous axial images were obtained from the base of the skull through the vertex without intravenous contrast. RADIATION DOSE REDUCTION: This exam was performed according to the departmental dose-optimization program which includes automated exposure control, adjustment of the mA and/or kV according to patient size and/or use of iterative reconstruction technique. COMPARISON:  None Available. FINDINGS: Brain: There is mild cerebral atrophy with widening of the extra-axial spaces and ventricular dilatation. There are areas of decreased attenuation within the white matter tracts of the supratentorial brain, consistent with microvascular disease changes. Small, bilateral, chronic basal ganglia lacunar infarcts are seen. Vascular: No hyperdense vessel or unexpected calcification. Skull: Negative for acute skull fracture. Acute fracture deformity is seen extending through the junction of the base of the dens and body of the C2 vertebral body. Sinuses/Orbits: No acute  finding. Other: Marked severity frontal scalp soft tissue swelling is seen, to the right of midline. An associated 3.3 cm x 1.6 cm right frontal scalp hematoma is noted. IMPRESSION: 1. Acute Type 2 odontoid fracture (unstable). 2. Marked severity right frontal scalp soft tissue swelling, with an associated 3.3 cm x 1.6 cm right frontal scalp hematoma. 3. Generalized cerebral atrophy without acute intracranial abnormality. Electronically Signed   By: Virgina Norfolk M.D.   On: 07/27/2021 20:55   DG Knee Complete 4 Views Right  Result Date: 07/27/2021 CLINICAL DATA:  Fall EXAM: RIGHT KNEE - COMPLETE 4+ VIEW COMPARISON:  02/09/2018 FINDINGS: No fracture or malalignment. Tricompartment arthritis of the knee with severe joint space narrowing involving the medial joint space with moderate patellofemoral degenerative change. No sizable knee effusion. IMPRESSION: Tricompartment arthritis.  No acute osseous abnormality. Electronically Signed   By: Donavan Foil M.D.   On: 07/27/2021 20:28    Review of Systems  Constitutional: Negative.   HENT: Negative.    Eyes: Negative.   Respiratory: Negative.    Cardiovascular: Negative.   Gastrointestinal:  Positive for abdominal pain. Negative for constipation, diarrhea, nausea and vomiting.  Genitourinary: Negative.   Musculoskeletal:  Positive for back pain, joint pain and neck pain. Negative for myalgias.  Skin: Negative.   Neurological:  Positive for headaches. Negative for sensory change and speech change.  Endo/Heme/Allergies:  Bruises/bleeds easily.  Psychiatric/Behavioral: Negative.     Blood pressure (!) 160/92, pulse 60, temperature 97.8 F (36.6 C), temperature source Oral, resp. rate 13, SpO2 96 %. Estimated body mass index is 28.34 kg/m as calculated from the following:   Height as of 04/21/21: _0  (1.6 m).   Weight as of 04/21/21: 72.6 kg.  Physical Exam Constitutional:      Interventions: Cervical collar in place.  HENT:     Head:  Normocephalic. Raccoon eyes present.      Mouth/Throat:     Mouth: Mucous membranes are moist.  Pharynx: Oropharynx is clear.  Eyes:     Extraocular Movements: Extraocular movements intact.     Conjunctiva/sclera: Conjunctivae normal.     Pupils: Pupils are equal, round, and reactive to light.  Neck:     Trachea: Trachea normal.  Cardiovascular:     Rate and Rhythm: Normal rate and regular rhythm.     Pulses: Normal pulses.  Pulmonary:     Effort: Pulmonary effort is normal. No respiratory distress.  Abdominal:     Palpations: Abdomen is soft.  Musculoskeletal:        General: Normal range of motion.     Cervical back: Spinous process tenderness and muscular tenderness present.  Skin:    General: Skin is warm and dry.     Capillary Refill: Capillary refill takes less than 2 seconds.  Neurological:     Mental Status: She is easily aroused.     GCS: GCS eye subscore is 4. GCS verbal subscore is 4. GCS motor subscore is 5.     Cranial Nerves: Cranial nerve deficit present.     Sensory: Sensation is intact.     Comments: HOH  Psychiatric:        Mood and Affect: Mood normal.        Behavior: Behavior normal. Behavior is cooperative.     Assessment/Plan: Patient sustained a type 2 odontoid fracture following a fall from ground level. Given patient's age and comorbidities, recommend continuing hard cervical collar at all times. She is fine to mobilize in the hard collar. She should follow up with Dr. Annette Stable as an outpatient in two weeks.    Viona Gilmore, DNP, AGNP-C Nurse Practitioner  [facility] [place] [address] [place], [place], [place] [address] P: [phone]    F: [phone]  07/28/2021, 10:19 AM

## 2021-07-28 NOTE — ED Notes (Signed)
Patient found sitting on the side of the bed, stated she was going home. Patient placed back in the bed, cleaned up and pure wick placed.

## 2021-07-28 NOTE — Progress Notes (Signed)
  Transition of Care [facility]) Screening Note   Patient Details  Name: [name] Date of Birth: Jan 31, 1928   Transition of Care [facility]) CM/SW Contact:    Sharin Mons, RN Phone Number: [phone] 07/28/2021, 1:43 PM   Admitted s/p fall, suffered  Odontoid fracture:C1 fracture. From home with daughter.  Transition of Care Department [facility]) has reviewed patient and no present TOC needs have been identified at this time.   PT/ OT evaluation pending...  TOC team will continue monitoring patient's advancement through interdisciplinary progression rounds.  If new patient transition needs arise, please place a TOC consult.

## 2021-07-28 NOTE — Progress Notes (Signed)
[facility]                                                                               [name], is a 88 y.o. female, DOB - [birth year], ION:[id number] Admit date - 07/27/2021    Outpatient Primary MD for the patient is [name], MD  LOS - 1  days    Brief summary   [name] is a pleasant 88 y.o. female with medical history significant for atrial fibrillation no longer anticoagulated, symptomatic bradycardia with pacemaker, chronic diastolic CHF, hypertension, asthma, and CKD stage IIIb, now presenting to the emergency department after a fall.   Patient was in her usual state and having an uneventful day when her daughter heard her shuffling without her walker, went to check on her, and witnessed her fall forward and strike her face on the ground.  There was no loss of consciousness.  CBC features a stable microcytic anemia and slight thrombocytopenia.  CT cervical spine is concerning for acute type II odontoid fracture and acute fractures of the bilateral aspects of the posterior arch of C1 vertebral body.  Maxillofacial CT reveals acute mildly displaced right nasal bone fracture, and head CT is negative for acute intracranial abnormality but notable for severe right frontal scalp soft tissue swelling with hematoma.  Neurosurgery was consulted by the ED physician, hard cervical collar was placed. Neuro surgery recommended hard cervical collar at all times and outpatient follow up with Dr Annette Stable.   Assessment & Plan    Assessment and Plan:   Type II Odontoid fracture:C1 fracture:  S/p hard cervical collar.  Pain control. And therapy evaluation.  Neuro surgery consulted , recommended hard cervical collar at all times and outpatient follow up with Dr Annette Stable.    Nasal Bone fracture:  Mildly displaced right nasal bone fracture noted on ED.  Follow up with ENT as outpatient.     H/o chronic diastolic CHF:  She appears compensated.  Continue  with strict intake and output and daily weights.    Stage 3b CKD:  Creatinine appears to be at baseline.    PAF: Anticoagulation contraindicated due to recurrent falls.  On aspirin.    Asthma:  No wheezing heard.   Hypertensive Urgency:  Prn hydralazine ordered.       Estimated body mass index is 28.34 kg/m as calculated from the following:   Height as of 04/21/21: '5\' 3"'$  (1.6 m).   Weight as of 04/21/21: 72.6 kg.  Code Status: DNR DVT Prophylaxis:  SCDs Start: 07/27/21 2348   Level of Care: Level of care: Med-Surg Family Communication: None at bedside.   Disposition Plan:     Remains inpatient appropriate:  pain control  Procedures:  CT head and cervical spine.   Consultants:   Neuro surgery.   Antimicrobials:   Anti-infectives (From admission, onward)    None        Medications  Scheduled Meds:  famotidine  20 mg Oral BID   fluticasone furoate-vilanterol  1 puff Inhalation QAC breakfast   montelukast  10 mg Oral QHS   sertraline  50 mg Oral Daily   Continuous Infusions:  sodium chloride 65  mL/hr at 07/28/21 0356   PRN Meds:.albuterol, fentaNYL (SUBLIMAZE) injection, ondansetron (ZOFRAN) IV, oxyCODONE-acetaminophen    Subjective:   [name] was seen and examined today. Requesting for pain meds and water.  Objective:   Vitals:   07/28/21 1045 07/28/21 1100 07/28/21 1147 07/28/21 1310  BP: (!) 155/72 (!) 169/89 (!) 172/75 (!) 169/79  Pulse: 61 63 68 63  Resp: 11 (!) 21 (!) 23   Temp:   98.4 F (36.9 C)   TempSrc:   Oral   SpO2: 96% 96% 95% 96%   No intake or output data in the 24 hours ending 07/28/21 1314 There were no vitals filed for this visit.   Exam General exam: Elderly woman deconditioned, not in distress, in c collar.  Respiratory system: Clear to auscultation. Respiratory effort normal. Cardiovascular system: S1 & S2 heard, RRR. No JVD,  No pedal edema. Gastrointestinal system: Abdomen is nondistended, soft and  nontender. Normal bowel sounds heard. Central nervous system: Alert , confused, able to tell me her name .  Extremities: Symmetric 5 x 5 power. Skin: bruising on the face.  Psychiatry:  Mood & affect appropriate.     Data Reviewed:  I have personally reviewed following labs and imaging studies   CBC Lab Results  Component Value Date   WBC 5.1 07/27/2021   RBC 3.10 (L) 07/27/2021   HGB 10.3 (L) 07/27/2021   HCT 31.9 (L) 07/27/2021   MCV 102.9 (H) 07/27/2021   MCH 33.2 07/27/2021   PLT 146 (L) 07/27/2021   MCHC 32.3 07/27/2021   RDW 14.3 07/27/2021   LYMPHSABS 1.6 07/27/2021   MONOABS 0.3 07/27/2021   EOSABS 0.1 07/27/2021   BASOSABS 0.0 27/51/7001     Last metabolic panel Lab Results  Component Value Date   NA 141 07/27/2021   K 5.0 07/27/2021   CL 110 07/27/2021   CO2 21 (L) 07/27/2021   BUN 30 (H) 07/27/2021   CREATININE 1.22 (H) 07/27/2021   GLUCOSE 93 07/27/2021   GFRNONAA 42 (L) 07/27/2021   GFRAA 37 (L) 06/28/2019   CALCIUM 8.8 (L) 07/27/2021   PROT 6.0 (L) 11/21/2020   ALBUMIN 3.6 11/21/2020   BILITOT 1.2 11/21/2020   ALKPHOS 98 11/21/2020   AST 15 11/21/2020   ALT 11 11/21/2020   ANIONGAP 10 07/27/2021    CBG (last 3)  No results for input(s): "GLUCAP" in the last 72 hours.    Coagulation Profile: No results for input(s): "INR", "PROTIME" in the last 168 hours.   Radiology Studies: CT Maxillofacial Wo Contrast  Result Date: 07/27/2021 CLINICAL DATA:  Status post fall. EXAM: CT MAXILLOFACIAL WITHOUT CONTRAST TECHNIQUE: Multidetector CT imaging of the maxillofacial structures was performed. Multiplanar CT image reconstructions were also generated. RADIATION DOSE REDUCTION: This exam was performed according to the departmental dose-optimization program which includes automated exposure control, adjustment of the mA and/or kV according to patient size and/or use of iterative reconstruction technique. COMPARISON:  None Available. FINDINGS: Osseous: An  acute mildly displaced right-sided nasal bone fracture is seen. Acute displaced fracture deformities are seen involving the bilateral aspects of the anterior and posterior arches of C1. An additional fracture is seen extending through the base of the dens and body of C2. Orbits: Negative. No traumatic or inflammatory finding. Sinuses: Clear. Soft tissues: There is mild bilateral paranasal soft tissue thickening. Marked severity soft tissue swelling is also seen, to the right of midline. An associated right frontal scalp hematoma is noted. Limited intracranial: No significant or  unexpected finding. IMPRESSION: 1. Acute nasal bone fracture and fracture deformities of the C1 and C2 vertebral bodies, as described above. 2. Marked severity right frontal scalp soft tissue swelling, with an associated right frontal scalp hematoma. Electronically Signed   By: Virgina Norfolk M.D.   On: 07/27/2021 21:31   CT Cervical Spine Wo Contrast  Result Date: 07/27/2021 CLINICAL DATA:  Status post fall. EXAM: CT CERVICAL SPINE WITHOUT CONTRAST TECHNIQUE: Multidetector CT imaging of the cervical spine was performed without intravenous contrast. Multiplanar CT image reconstructions were also generated. RADIATION DOSE REDUCTION: This exam was performed according to the departmental dose-optimization program which includes automated exposure control, adjustment of the mA and/or kV according to patient size and/or use of iterative reconstruction technique. COMPARISON:  January 05, 2021 FINDINGS: Alignment: Normal. Skull base and vertebrae: An acute, nondisplaced fracture deformity is seen extending through the junction of the base of the dens and body of C2. Acute comminuted fracture deformities are also seen involving the bilateral aspects of the anterior and posterior arches of the C1 vertebral body. Soft tissues and spinal canal: No prevertebral fluid or swelling. No visible canal hematoma. Disc levels: Very mild, anterior  osteophyte formation is seen at the levels of C5-C6 and C6-C7. There is marked severity narrowing of the anterior atlantoaxial articulation. Very mild multilevel intervertebral disc space narrowing is seen throughout the remainder of the cervical spine. Marked to severity multilevel bilateral facet joint hypertrophy is noted, left greater than right. Upper chest: Negative. Other: A chronic posterior third left rib fracture is seen. IMPRESSION: 1. Acute type 2 odontoid fracture (unstable). 2. Acute fractures involving the bilateral aspects of the anterior and posterior arches of the C1 vertebral body. 3. Marked to severity multilevel bilateral facet joint hypertrophy, left greater than right. 4. Very mild, anterior osteophyte formation at the levels of C5-C6 and C6-C7. 5. Chronic posterior third left rib fracture. Electronically Signed   By: Virgina Norfolk M.D.   On: 07/27/2021 21:08   CT Head Wo Contrast  Result Date: 07/27/2021 CLINICAL DATA:  Status post fall. EXAM: CT HEAD WITHOUT CONTRAST TECHNIQUE: Contiguous axial images were obtained from the base of the skull through the vertex without intravenous contrast. RADIATION DOSE REDUCTION: This exam was performed according to the departmental dose-optimization program which includes automated exposure control, adjustment of the mA and/or kV according to patient size and/or use of iterative reconstruction technique. COMPARISON:  None Available. FINDINGS: Brain: There is mild cerebral atrophy with widening of the extra-axial spaces and ventricular dilatation. There are areas of decreased attenuation within the white matter tracts of the supratentorial brain, consistent with microvascular disease changes. Small, bilateral, chronic basal ganglia lacunar infarcts are seen. Vascular: No hyperdense vessel or unexpected calcification. Skull: Negative for acute skull fracture. Acute fracture deformity is seen extending through the junction of the base of the dens and  body of the C2 vertebral body. Sinuses/Orbits: No acute finding. Other: Marked severity frontal scalp soft tissue swelling is seen, to the right of midline. An associated 3.3 cm x 1.6 cm right frontal scalp hematoma is noted. IMPRESSION: 1. Acute Type 2 odontoid fracture (unstable). 2. Marked severity right frontal scalp soft tissue swelling, with an associated 3.3 cm x 1.6 cm right frontal scalp hematoma. 3. Generalized cerebral atrophy without acute intracranial abnormality. Electronically Signed   By: Virgina Norfolk M.D.   On: 07/27/2021 20:55   DG Knee Complete 4 Views Right  Result Date: 07/27/2021 CLINICAL DATA:  Fall EXAM: RIGHT KNEE -  COMPLETE 4+ VIEW COMPARISON:  02/09/2018 FINDINGS: No fracture or malalignment. Tricompartment arthritis of the knee with severe joint space narrowing involving the medial joint space with moderate patellofemoral degenerative change. No sizable knee effusion. IMPRESSION: Tricompartment arthritis.  No acute osseous abnormality. Electronically Signed   By: Donavan Foil M.D.   On: 07/27/2021 20:28       Hosie Poisson M.D. [facility] 07/28/2021, 1:14 PM  Available via Epic secure chat 7am-7pm After 7 pm, please refer to night coverage provider listed on amion.

## 2021-07-28 NOTE — ED Notes (Signed)
Pt reported purewick slipped out. Pt linens and brief changed, purewick reapplied

## 2021-07-29 DIAGNOSIS — I5032 Chronic diastolic (congestive) heart failure: Secondary | ICD-10-CM | POA: Diagnosis not present

## 2021-07-29 DIAGNOSIS — J45909 Unspecified asthma, uncomplicated: Secondary | ICD-10-CM | POA: Diagnosis not present

## 2021-07-29 DIAGNOSIS — S12110A Anterior displaced Type II dens fracture, initial encounter for closed fracture: Secondary | ICD-10-CM | POA: Diagnosis not present

## 2021-07-29 LAB — BASIC METABOLIC PANEL
Anion gap: 6 (ref 5–15)
BUN: 19 mg/dL (ref 8–23)
CO2: 22 mmol/L (ref 22–32)
Calcium: 8.7 mg/dL — ABNORMAL LOW (ref 8.9–10.3)
Chloride: 108 mmol/L (ref 98–111)
Creatinine, Ser: 1.01 mg/dL — ABNORMAL HIGH (ref 0.44–1.00)
GFR, Estimated: 52 mL/min — ABNORMAL LOW (ref >60)
Glucose, Bld: 113 mg/dL — ABNORMAL HIGH (ref 70–99)
Potassium: 4.3 mmol/L (ref 3.5–5.1)
Sodium: 136 mmol/L (ref 135–145)

## 2021-07-29 LAB — CBC
HCT: 32.2 % — ABNORMAL LOW (ref 36.0–46.0)
Hemoglobin: 10.4 g/dL — ABNORMAL LOW (ref 12.0–15.0)
MCH: 32.4 pg (ref 26.0–34.0)
MCHC: 32.3 g/dL (ref 30.0–36.0)
MCV: 100.3 fL — ABNORMAL HIGH (ref 80.0–100.0)
Platelets: 130 10*3/uL — ABNORMAL LOW (ref 150–400)
RBC: 3.21 MIL/uL — ABNORMAL LOW (ref 3.87–5.11)
RDW: 13.8 % (ref 11.5–15.5)
WBC: 6.7 10*3/uL (ref 4.0–10.5)
nRBC: 0 % (ref 0.0–0.2)

## 2021-07-29 MED ORDER — ACETAMINOPHEN 325 MG PO TABS
650.0000 mg | ORAL_TABLET | Freq: Four times a day (QID) | ORAL | Status: AC
  Administered 2021-07-29 – 2021-07-30 (×3): 650 mg via ORAL
  Filled 2021-07-29 (×4): qty 2

## 2021-07-29 MED ORDER — TRAMADOL HCL 50 MG PO TABS: 25.0000 mg | ORAL_TABLET | Freq: Every evening | ORAL | Status: DC | PRN

## 2021-07-29 MED ORDER — ACETAMINOPHEN 325 MG PO TABS: 650.0000 mg | ORAL_TABLET | ORAL | Status: DC | PRN

## 2021-07-29 MED ORDER — HYDROMORPHONE HCL 1 MG/ML IJ SOLN: 0.5000 mg | Freq: Three times a day (TID) | INTRAMUSCULAR | Status: DC | PRN

## 2021-07-29 MED ORDER — METHOCARBAMOL 1000 MG/10ML IJ SOLN
500.0000 mg | Freq: Four times a day (QID) | INTRAVENOUS | Status: DC | PRN
  Administered 2021-07-30 – 2021-08-01 (×4): 500 mg via INTRAVENOUS
  Filled 2021-07-29: qty 500
  Filled 2021-07-29: qty 5
  Filled 2021-07-29: qty 500
  Filled 2021-07-29: qty 5
  Filled 2021-07-29: qty 500

## 2021-07-29 NOTE — TOC Initial Note (Signed)
Transition of Care [facility]) - Initial/Assessment Note    Patient Details  Name: [name] MRN: [id number] Date of Birth: Aug 25, 1927  [facility] of [facility]) CM/SW Contact:    Michelle Chars, LCSW Phone Number: 07/29/2021, 1:49 PM  Clinical Narrative:      Pt oriented x1, CSW attempted to reach several listed family members: daughter, son, granddaughter.  CSW received call back from granddaughter [name].  Pt lives with [name] and pt's daughter (Suzanne's mother) [name].  Mary hard to reach during the day due to work.  They are in agreement with plan for SNF, pt was at [facility], discharged right around Easter, just finished [facility] services with [facility]  They would like return to [facility] if possible, agreeable to SNF, permission given to send out referral in hub.    CSW spoke with [facility] and they are full, cannot offer bed.               Expected Discharge Plan: Skilled Nursing Facility Barriers to Discharge: Continued Medical Work up, SNF Pending bed offer   Patient Goals and CMS Choice     Choice offered to / list presented to :  (pt granddaughter: [name])  Expected Discharge Plan and Services Expected Discharge Plan: [place] In-house Referral: Clinical Social Work     Living arrangements for the past 2 months: Single Family Home                                      Prior Living Arrangements/Services Living arrangements for the past 2 months: Single Family Home Lives with:: Adult Children (with daughter, granddaughter) Patient language and need for interpreter reviewed:: No        Need for Family Participation in Patient Care: Yes (Comment) Care giver support system in place?: Yes (comment) Current home services: Other (comment) (none) Criminal Activity/Legal Involvement Pertinent to Current Situation/Hospitalization: No - Comment as needed  Activities of Daily Living      Permission Sought/Granted                   Emotional Assessment Appearance:: Appears stated age Attitude/Demeanor/Rapport: Unable to Assess Affect (typically observed): Unable to Assess Orientation: : Oriented to Self Alcohol / Substance Use: Not Applicable Psych Involvement: No (comment)  Admission diagnosis:  Closed fracture of nasal bone, initial encounter [S02.2XXA] Fall, initial encounter [id number]] Closed odontoid fracture with type II morphology ([place]) [S12.110A] Closed fracture of cervical vertebra, unspecified cervical vertebral [name], initial encounter ([place]) [S12.9XXA] Patient Active Problem List   Diagnosis Date Noted   Closed odontoid fracture with type II morphology ([place]) 07/27/2021   Closed C1 fracture ([place]) 07/27/2021   Nasal bone fracture 25/63/8937   Acute metabolic encephalopathy 34/28/7681   Right upper lobe pneumonia 11/21/2020   Non-ST elevated myocardial infarction ([place]) 06/27/2019   Hip fracture ([place]) 02/10/2018   Closed comminuted intertrochanteric fracture of left femur ([place]) 02/10/2018   Pacemaker 07/06/2017   Chronic respiratory failure with hypoxia ([place]) 06/09/2017   Pleural effusion associated with pulmonary infection 06/09/2017   Hyperkalemia 06/09/2017   Pleuritic chest pain 06/09/2017   Fever 05/14/2017   Renal insufficiency    Constipation 04/25/2017   Urine finding 04/25/2017   Posterior rhinorrhea 04/25/2017   Acute upper respiratory infection 04/25/2017   Thrombocytopenia ([place]) 01/15/2017   CAP (community acquired pneumonia) 01/15/2017   Symptomatic bradycardia 06/30/2016   Hypertensive heart disease 04/01/2016  Bradycardia 04/01/2016   Fall 04/01/2016   Sinus bradycardia    Stage 3b chronic [name] disease (CKD) (HCC)    Normochromic normocytic anemia    Essential hypertension    PAF (paroxysmal atrial fibrillation) (HCC)    Palpitations 09/18/2014   Shortness of breath 03/19/2014   Chronic diastolic CHF (congestive heart failure), NYHA class 2 ([place]) 03/19/2014   Fatigue  09/11/2013   Gastroenteritis 07/20/2013   Abdominal pain 07/20/2013   Abnormal x-ray of abdomen 07/20/2013   Diarrhea 07/20/2013   Generalized weakness 07/20/2013   Obesity (BMI 30-39.9) 07/20/2013   Hypercholesteremia    GERD (gastroesophageal reflux disease)    Depression    Asthma    Gout    Acute blood loss anemia 04/05/2013   Osteoarthritis of left knee 04/04/2013   Total knee replacement status 04/04/2013   Pulmonary hypertension ([place]) 03/07/2013   Anemia, unspecified 03/19/2012   Unspecified deficiency anemia 03/19/2012   Dizziness 07/09/2010   Chest pain 07/30/2009   HYPERTENSION, UNSPECIFIED 08/19/2008   PAROXYSMAL ATRIAL FIBRILLATION 08/19/2008   GASTROESOPHAGEAL REFLUX DISEASE, HX OF 08/19/2008   PCP:  [name], MD Pharmacy:   [facility] - [place], [place] - [address] [place] [address] Phone: [phone] Fax: [phone]     Social Determinants of Health (SDOH) Interventions    Readmission Risk Interventions     No data to display

## 2021-07-29 NOTE — Progress Notes (Signed)
Physical Therapy Treatment Patient Details Name: [name] MRN: [id number] DOB: Jun 25, 1927 Today's Date: 07/29/2021   History of Present Illness Pt is a 88 y.o. F who presents after a fall. CT scan of c-spine revealed type 2 odontoid fractures. Also noted mildly displaced right nasal bone fracture. CT head negative for acute abnormality. Significant PMH: atrial fibrillation, symptomatic bradycardia with pacemaker, chronic diastolic CHF, HTN, asthma, CKD stage IIIb.    PT Comments    Pt making good progress towards her physical therapy goals this session with improvement in pain with premedication and activity tolerance. Continues to require increased assist for bed mobility, however progressing to min-mod assist with transfers and ambulation. Cervical collar is ill fitting; Scott with [employer] to bring new brace for optimal fit.     Recommendations for follow up therapy are one component of a multi-disciplinary discharge planning process, led by the attending physician.  Recommendations may be updated based on patient status, additional functional criteria and insurance authorization.  Follow Up Recommendations  Skilled nursing-short term rehab (<3 hours/day) Can patient physically be transported by private vehicle: Yes   Assistance Recommended at Discharge Frequent or constant Supervision/Assistance  Patient can return home with the following A lot of help with walking and/or transfers;A lot of help with bathing/dressing/bathroom;Assistance with cooking/housework;Assist for transportation;Help with stairs or ramp for entrance   Equipment Recommendations  Hospital bed    Recommendations for Other Services       Precautions / Restrictions Precautions Precautions: Cervical;Fall Precaution Booklet Issued: Yes (comment) Required Braces or Orthoses: Cervical Brace Cervical Brace: Hard collar;At all times Restrictions Weight Bearing Restrictions: No     Mobility  Bed  Mobility Overal bed mobility: Needs Assistance Bed Mobility: Supine to Sit     Supine to sit: +2 for physical assistance, +2 for safety/equipment, HOB elevated, Max assist     General bed mobility comments: Due to difficulty rolling in previous session, guided pt in long sitting with neck and trunk support and advancing BLE to EOB with cues to keep spine straight. Assist needed to lift/maintain trunk upright, manuever LEs to EOB and scoot hips    Transfers Overall transfer level: Needs assistance Equipment used: Rolling walker (2 wheels) Transfers: Sit to/from Stand Sit to Stand: Min assist, Mod assist           General transfer comment: Min A from bedside with pt pulling on RW, required light Mod A to stand from regular toilet and correct posterior bias    Ambulation/Gait Ambulation/Gait assistance: Min assist, +2 safety/equipment Gait Distance (Feet): 60 Feet (2 bouts of 30 ft) Assistive device: Rolling walker (2 wheels) Gait Pattern/deviations: Step-through pattern, Decreased stride length Gait velocity: decreased Gait velocity interpretation: <1.8 ft/sec, indicate of risk for recurrent falls   General Gait Details: MinA for balance, chair follow utilized; required 1 seated rest break in between bouts   Stairs             Wheelchair Mobility    Modified Rankin (Stroke Patients Only)       Balance Overall balance assessment: Needs assistance Sitting-balance support: No upper extremity supported, Feet supported Sitting balance-Leahy Scale: Poor Sitting balance - Comments: varied from Min A to close min guard for static sitting balance EOB Postural control: Posterior lean Standing balance support: Bilateral upper extremity supported, During functional activity Standing balance-Leahy Scale: Poor Standing balance comment: reliant on BUE support standing or external support with unsupported ADLs at sink  Cognition  Arousal/Alertness: Awake/alert Behavior During Therapy: WFL for tasks assessed/performed Overall Cognitive Status: Impaired/Different from baseline Area of Impairment: Following commands, Memory, Awareness, Safety/judgement, Problem solving                     Memory: Decreased recall of precautions, Decreased short-term memory Following Commands: Follows one step commands consistently Safety/Judgement: Decreased awareness of deficits, Decreased awareness of safety Awareness: Intellectual Problem Solving: Slow processing, Requires verbal cues, Decreased initiation, Requires tactile cues General Comments: STM deficits at baseline, follows directions consistently, some difficulty providing appropriate responses though likely d/t HOH. Decreased awareness of precautions, safety as pt attempted to mobilize away from sink without RW        Exercises      General Comments        Pertinent Vitals/Pain Pain Assessment Pain Assessment: Faces Faces Pain Scale: Hurts even more Pain Location: neck Pain Descriptors / Indicators: Discomfort, Grimacing, Guarding, Moaning Pain Intervention(s): Monitored during session, Limited activity within patient's tolerance, Premedicated before session    Home Living Family/patient expects to be discharged to:: Private residence Living Arrangements: Children;Other relatives (granddaughter) Available Help at Discharge: Family;Available 24 hours/day Type of Home: House Home Access: Stairs to enter   [employer] of Steps: 2   Home Layout: One level Home Equipment: Conservation officer, nature (2 wheels);Wheelchair - manual;Cane - quad;BSC/3in1;Shower seat Additional Comments: Discharged from SNF 2 months ago. Aide 3 days/wk    Prior Function            PT Goals (current goals can now be found in the care plan section) Acute Rehab PT Goals Patient Stated Goal: less pain Potential to Achieve Goals: Fair Progress towards PT goals: Progressing  toward goals    Frequency    Min 3X/week      PT Plan Current plan remains appropriate    Co-evaluation PT/OT/SLP Co-Evaluation/Treatment: Yes Reason for Co-Treatment: Necessary to address cognition/behavior during functional activity;For patient/therapist safety;To address functional/ADL transfers PT goals addressed during session: Mobility/safety with mobility OT goals addressed during session: ADL's and self-care;Strengthening/ROM      AM-PAC PT "6 Clicks" Mobility   Outcome Measure  Help needed turning from your back to your side while in a flat bed without using bedrails?: A Lot Help needed moving from lying on your back to sitting on the side of a flat bed without using bedrails?: Total Help needed moving to and from a bed to a chair (including a wheelchair)?: A Little Help needed standing up from a chair using your arms (e.g., wheelchair or bedside chair)?: A Lot Help needed to walk in hospital room?: A Little Help needed climbing 3-5 steps with a railing? : A Lot 6 Click Score: 13    End of Session Equipment Utilized During Treatment: Gait belt;Cervical collar Activity Tolerance: Patient tolerated treatment well Patient left: in bed;with call bell/phone within reach;with bed alarm set;with family/visitor present Nurse Communication: Mobility status PT Visit Diagnosis: History of falling (Z91.81);Muscle weakness (generalized) (M62.81);Pain     Time: 8937-3428 PT Time Calculation (min) (ACUTE ONLY): 26 min  Charges:  $Therapeutic Activity: 8-22 mins                     Wyona Almas, PT, DPT Acute Rehabilitation Services Office [phone]    Deno Etienne 07/29/2021, 10:54 AM

## 2021-07-29 NOTE — [place] FL2 (Signed)
[place] MEDICAID FL2 LEVEL OF CARE SCREENING TOOL     IDENTIFICATION  Patient Name: [name] Birthdate: December 21, 1927 Sex: female Admission Date (Current Location): 07/27/2021  [facility] and [place] Number:  Herbalist and Address:  [facility], [place] [address] [place], [place] [address]      Provider Number: [id number]  Attending Physician Name and Address:  Hosie Poisson, MD  Relative Name and Phone Number:  Carollee Herter Daughter [phone]    Current Level of Care: Hospital Recommended Level of Care: [place] Prior Approval Number:    Date Approved/Denied:   PASRR Number: [id number]  Discharge Plan: SNF    Current Diagnoses: Patient Active Problem List   Diagnosis Date Noted   Closed odontoid fracture with type II morphology ([place]) 07/27/2021   Closed C1 fracture ([place]) 07/27/2021   Nasal bone fracture 20/23/3435   Acute metabolic encephalopathy 68/61/6837   Right upper lobe pneumonia 11/21/2020   Non-ST elevated myocardial infarction ([place]) 06/27/2019   Hip fracture ([place]) 02/10/2018   Closed comminuted intertrochanteric fracture of left femur ([place]) 02/10/2018   Pacemaker 07/06/2017   Chronic respiratory failure with hypoxia ([place]) 06/09/2017   Pleural effusion associated with pulmonary infection 06/09/2017   Hyperkalemia 06/09/2017   Pleuritic chest pain 06/09/2017   Fever 05/14/2017   Renal insufficiency    Constipation 04/25/2017   Urine finding 04/25/2017   Posterior rhinorrhea 04/25/2017   Acute upper respiratory infection 04/25/2017   Thrombocytopenia ([place]) 01/15/2017   CAP (community acquired pneumonia) 01/15/2017   Symptomatic bradycardia 06/30/2016   Hypertensive heart disease 04/01/2016   Bradycardia 04/01/2016   Fall 04/01/2016   Sinus bradycardia    Stage 3b chronic kidney disease (CKD) (HCC)    Normochromic normocytic anemia    Essential hypertension    PAF (paroxysmal atrial  fibrillation) (HCC)    Palpitations 09/18/2014   Shortness of breath 03/19/2014   Chronic diastolic CHF (congestive heart failure), NYHA class 2 ([place]) 03/19/2014   Fatigue 09/11/2013   Gastroenteritis 07/20/2013   Abdominal pain 07/20/2013   Abnormal x-ray of abdomen 07/20/2013   Diarrhea 07/20/2013   Generalized weakness 07/20/2013   Obesity (BMI 30-39.9) 07/20/2013   Hypercholesteremia    GERD (gastroesophageal reflux disease)    Depression    Asthma    Gout    Acute blood loss anemia 04/05/2013   Osteoarthritis of left knee 04/04/2013   Total knee replacement status 04/04/2013   Pulmonary hypertension ([place]) 03/07/2013   Anemia, unspecified 03/19/2012   Unspecified deficiency anemia 03/19/2012   Dizziness 07/09/2010   Chest pain 07/30/2009   HYPERTENSION, UNSPECIFIED 08/19/2008   PAROXYSMAL ATRIAL FIBRILLATION 08/19/2008   GASTROESOPHAGEAL REFLUX DISEASE, HX OF 08/19/2008    Orientation RESPIRATION BLADDER Height & Weight     Self  Normal Incontinent, External catheter Weight:   Height:     BEHAVIORAL SYMPTOMS/MOOD NEUROLOGICAL BOWEL NUTRITION STATUS      Continent Diet (see discharge summary)  AMBULATORY STATUS COMMUNICATION OF NEEDS Skin   Limited Assist Verbally Bruising, Other (Comment) (ecchymosis)                       Personal Care Assistance Level of Assistance  Bathing, Feeding, Dressing Bathing Assistance: Limited assistance Feeding assistance: Limited assistance Dressing Assistance: Maximum assistance     Functional Limitations Info  Sight, Hearing, Speech Sight Info: Adequate Hearing Info: Impaired Speech Info: Impaired    SPECIAL CARE FACTORS FREQUENCY  PT (By licensed  PT), OT (By licensed OT)     PT Frequency: 5x week OT Frequency: 5x week            Contractures Contractures Info: Not present    Additional Factors Info  Code Status, Allergies Code Status Info: DNR Allergies Info: Sulfonamide Derivatives, Amoxicillin, Diltiazem  Hcl, Losartan Potassium, Potassium-containing Compounds, Zetia (Ezetimibe), Penicillins           Current Medications (07/29/2021):  This is the current hospital active medication list Current Facility-Administered Medications  Medication Dose Route Frequency Provider Last Rate Last Admin   acetaminophen (TYLENOL) tablet 650 mg  650 mg Oral Q6H Akula, Vijaya, MD       albuterol (PROVENTIL) (2.5 MG/3ML) 0.083% nebulizer solution 2.5 mg  2.5 mg Nebulization Q6H PRN Opyd, Ilene Qua, MD       famotidine (PEPCID) tablet 20 mg  20 mg Oral BID Hosie Poisson, MD   20 mg at 07/29/21 0911   fluticasone furoate-vilanterol (BREO ELLIPTA) 100-25 MCG/ACT 1 puff  1 puff Inhalation QAC breakfast Opyd, Ilene Qua, MD       hydrALAZINE (APRESOLINE) tablet 25 mg  25 mg Oral Q8H PRN Hosie Poisson, MD       HYDROmorphone (DILAUDID) injection 0.5 mg  0.5 mg Intravenous Q8H PRN Hosie Poisson, MD       methocarbamol (ROBAXIN) 500 mg in dextrose 5 % 50 mL IVPB  500 mg Intravenous Q6H PRN Hosie Poisson, MD       montelukast (SINGULAIR) tablet 10 mg  10 mg Oral QHS Hosie Poisson, MD   10 mg at 07/28/21 2109   ondansetron (ZOFRAN) injection 4 mg  4 mg Intravenous Q6H PRN Opyd, Ilene Qua, MD       oxyCODONE-acetaminophen (PERCOCET/ROXICET) 5-325 MG per tablet 1-2 tablet  1-2 tablet Oral Q4H PRN Hosie Poisson, MD   2 tablet at 07/29/21 0911   sertraline (ZOLOFT) tablet 50 mg  50 mg Oral Daily Hosie Poisson, MD   50 mg at 07/29/21 0911   traMADol (ULTRAM) tablet 25 mg  25 mg Oral QHS PRN Hosie Poisson, MD         Discharge Medications: Please see discharge summary for a list of discharge medications.  Relevant Imaging Results:  Relevant Lab Results:   Additional Information SSN# [id number]/ Oliver vax x2.  Joanne Chars, LCSW

## 2021-07-29 NOTE — Progress Notes (Signed)
Orthopedic Tech Progress Note Patient Details:  MARKETTA VALADEZ 04-Oct-1927 [id number]  RN called this morning about patient needing a smaller collar, spoke with HANGER about getting patient a better fitting colla, when HANGER guy went to apply it patient was resting and he left it in the room on the countertop   Patient ID: [name], female   DOB: April 04, 1927, 88 y.o.   MRN: [id number]  Janit Pagan 07/29/2021, 5:22 PM

## 2021-07-29 NOTE — Evaluation (Signed)
Occupational Therapy Evaluation Patient Details Name: [name] MRN: [id number] DOB: 10/31/1927 Today's Date: 07/29/2021   History of Present Illness Pt is a 88 y.o. F who presents after a fall. CT scan of c-spine revealed type 2 odontoid fractures. Also noted mildly displaced right nasal bone fracture. CT head negative for acute abnormality. Significant PMH: atrial fibrillation, symptomatic bradycardia with pacemaker, chronic diastolic CHF, HTN, asthma, CKD stage IIIb.   Clinical Impression   PTA, pt lives with family, typically ambulatory with RW though has had recent falls. Pt has an aide that assists with ADLs. Pt presents now with deficits in cognition, standing balance, strength and endurance. Pt continues to be limited by pain but able to progress OOB today. Overall, pt requires Min-Mod A for transfers, min guard for mobility using RW. Pt requires Min A for UB ADL and Mod-Max A for LB ADLs with noted posterior bias when standing unsupported. As pt is at high risk for further falls, recommend SNF rehab at [place]. However, pt may make consistent progress to return home with family assist and HHOT follow-up. Will continue to monitor and update DC recs as appropriate.       Recommendations for follow up therapy are one component of a multi-disciplinary discharge planning process, led by the attending physician.  Recommendations may be updated based on patient status, additional functional criteria and insurance authorization.   Follow Up Recommendations  Skilled nursing-short term rehab (<3 hours/day) (though progressing towards [facility])    Assistance Recommended at Discharge Frequent or constant Supervision/Assistance  Patient can return home with the following A little help with walking and/or transfers;A lot of help with bathing/dressing/bathroom;Assistance with cooking/housework;Direct supervision/assist for medications management;Direct supervision/assist for financial management     Functional Status Assessment  Patient has had a recent decline in their functional status and demonstrates the ability to make significant improvements in function in a reasonable and predictable amount of time.  Equipment Recommendations  None recommended by OT    Recommendations for Other Services       Precautions / Restrictions Precautions Precautions: Cervical;Fall Precaution Booklet Issued: Yes (comment) Required Braces or Orthoses: Cervical Brace Cervical Brace: Hard collar;At all times Restrictions Weight Bearing Restrictions: No      Mobility Bed Mobility Overal bed mobility: Needs Assistance Bed Mobility: Supine to Sit     Supine to sit: +2 for physical assistance, +2 for safety/equipment, HOB elevated, Max assist     General bed mobility comments: Due to difficulty rolling in previous session, guided pt in long sitting and advancing BLE to EOB with cues to keep spine straight. Assist needed to lift/maintain trunk upright, manuever LEs to EOB and scoot hips    Transfers Overall transfer level: Needs assistance Equipment used: Rolling walker (2 wheels) Transfers: Sit to/from Stand Sit to Stand: Min assist, Mod assist           General transfer comment: Min A from bedside with pt puilling on RW, required light Mod A to stand from regular toilet and correct posterior bias      Balance Overall balance assessment: Needs assistance Sitting-balance support: No upper extremity supported, Feet supported Sitting balance-Leahy Scale: Poor Sitting balance - Comments: varied from Min A to close min guard for static sitting balance EOB Postural control: Posterior lean Standing balance support: Bilateral upper extremity supported, During functional activity Standing balance-Leahy Scale: Poor Standing balance comment: reliant on BUE support standing or external support with unsupported ADLs at sink  ADL either performed or assessed  with clinical judgement   ADL Overall ADL's : Needs assistance/impaired Eating/Feeding: Set up;Bed level Eating/Feeding Details (indicate cue type and reason): pt reported difficulty eating with cervical collar on. pt able to demo collecting food with fork and bring to mouth w/ increased time, and drink from cup w/ straw Grooming: Min guard;Standing;Wash/dry face Grooming Details (indicate cue type and reason): min guard d/t minor posterior lean Upper Body Bathing: Minimal assistance;Sitting   Lower Body Bathing: Moderate assistance;Sit to/from stand   Upper Body Dressing : Minimal assistance;Sitting   Lower Body Dressing: Maximal assistance;Sit to/from stand Lower Body Dressing Details (indicate cue type and reason): sock mgmt Toilet Transfer: Ambulation;Comfort height toilet;Rolling walker (2 wheels);Grab bars;Minimal assistance Toilet Transfer Details (indicate cue type and reason): min guard for mobility to bathroom with RW. no BSC in room so guided pt hips safely to commode. Pt required light Mod A to stand from toilet height and gain balance with RW Toileting- Clothing Manipulation and Hygiene: Moderate assistance;Sit to/from stand;Sitting/lateral lean Toileting - Clothing Manipulation Details (indicate cue type and reason): able to perform anterior peri care seated on toilet after provision of toilet paper. assist for clothing mgmt needed     Functional mobility during ADLs: Min guard;Rolling walker (2 wheels);Cueing for sequencing;Cueing for safety General ADL Comments: Pt progressing OOB for ADLs in bathroom though noted balance deficits and decreased awareness of precautions/safety with DME     Vision Baseline Vision/History: 1 Wears glasses Ability to See in Adequate Light: 1 Impaired Patient Visual Report: No change from baseline Vision Assessment?: No apparent visual deficits     Perception     Praxis      Pertinent Vitals/Pain Pain Assessment Pain Assessment:  Faces Faces Pain Scale: Hurts little more Pain Location: neck Pain Descriptors / Indicators: Discomfort, Grimacing, Guarding, Moaning Pain Intervention(s): Monitored during session, Premedicated before session, Repositioned     Hand Dominance Right   Extremity/Trunk Assessment Upper Extremity Assessment Upper Extremity Assessment: Generalized weakness   Lower Extremity Assessment Lower Extremity Assessment: Defer to PT evaluation   Cervical / Trunk Assessment Cervical / Trunk Assessment: Kyphotic   Communication Communication Communication: HOH   Cognition Arousal/Alertness: Awake/alert Behavior During Therapy: WFL for tasks assessed/performed Overall Cognitive Status: Impaired/Different from baseline Area of Impairment: Following commands, Memory, Awareness, Safety/judgement, Problem solving                     Memory: Decreased recall of precautions, Decreased short-term memory Following Commands: Follows one step commands consistently Safety/Judgement: Decreased awareness of deficits, Decreased awareness of safety Awareness: Intellectual Problem Solving: Slow processing, Requires verbal cues, Decreased initiation, Requires tactile cues General Comments: STM deficits at baseline, follows directions consistently, some difficulty providing appropriate responses though likely d/t HOH. Decreased awareness of precautions, safety as pt attempted to mobilize away from sink without RW     General Comments       Exercises     Shoulder Instructions      Home Living Family/patient expects to be discharged to:: Private residence Living Arrangements: Children;Other relatives (granddaughter) Available Help at Discharge: Family;Available 24 hours/day Type of Home: House Home Access: Stairs to enter [employer] of Steps: 2   Home Layout: One level     Bathroom Shower/Tub: Occupational psychologist: Standard     Home Equipment: Conservation officer, nature (2  wheels);Wheelchair - manual;Cane - quad;BSC/3in1;Shower seat   Additional Comments: Discharged from SNF 2 months ago. Aide 3 days/wk  Prior Functioning/Environment Prior Level of Function : Needs assist             Mobility Comments: uses walker ADLs Comments: assist for ADLs though unsure how much assist. per family, pt enjoys cleaning in the home        OT Problem List: Decreased strength;Decreased activity tolerance;Impaired balance (sitting and/or standing);Decreased coordination;Decreased cognition;Decreased safety awareness;Decreased knowledge of use of DME or AE;Decreased knowledge of precautions;Pain      OT Treatment/Interventions: Self-care/ADL training;Therapeutic exercise;Energy conservation;DME and/or AE instruction;Therapeutic activities;Patient/family education;Balance training    OT Goals(Current goals can be found in the care plan section) Acute Rehab OT Goals Patient Stated Goal: go to bathroom OT Goal Formulation: With patient Time For Goal Achievement: 08/12/21 Potential to Achieve Goals: Good  OT Frequency: Min 2X/week    Co-evaluation PT/OT/SLP Co-Evaluation/Treatment: Yes Reason for Co-Treatment: For patient/therapist safety;To address functional/ADL transfers;Other (comment) (to progress OOB due to pain limitations)   OT goals addressed during session: ADL's and self-care;Strengthening/ROM      AM-PAC OT "6 Clicks" Daily Activity     Outcome Measure Help from another person eating meals?: A Little Help from another person taking care of personal grooming?: A Little Help from another person toileting, which includes using toliet, bedpan, or urinal?: A Lot Help from another person bathing (including washing, rinsing, drying)?: A Lot Help from another person to put on and taking off regular upper body clothing?: A Little Help from another person to put on and taking off regular lower body clothing?: A Lot 6 Click Score: 15   End of Session  Equipment Utilized During Treatment: Gait belt;Rolling walker (2 wheels);Cervical collar  Activity Tolerance: Patient tolerated treatment well Patient left: Other (comment) (to ambulate with PT in hallway)  OT Visit Diagnosis: Unsteadiness on feet (R26.81);Other abnormalities of gait and mobility (R26.89);Muscle weakness (generalized) (M62.81);History of falling (Z91.81)                Time: 4854-6270 OT Time Calculation (min): 21 min Charges:  OT General Charges $OT Visit: 1 Visit OT Evaluation $OT Eval Moderate Complexity: 1 Mod  Malachy Chamber, OTR/L Acute Rehab Services Office: [phone]   Layla Maw 07/29/2021, 10:13 AM

## 2021-07-29 NOTE — Progress Notes (Signed)
[facility]                                                                               [name], is a 88 y.o. female, DOB - 01-16-1928, GDJ:[id number] Admit date - 07/27/2021    Outpatient Primary MD for the patient is [name], MD  LOS - 2  days    Brief summary   [name] is a pleasant 88 y.o. female with medical history significant for atrial fibrillation no longer anticoagulated, symptomatic bradycardia with pacemaker, chronic diastolic CHF, hypertension, asthma, and CKD stage IIIb, now presenting to the emergency department after a fall.   Patient was in her usual state and having an uneventful day when her daughter heard her shuffling without her walker, went to check on her, and witnessed her fall forward and strike her face on the ground.  There was no loss of consciousness.  CBC features a stable microcytic anemia and slight thrombocytopenia.  CT cervical spine is concerning for acute type II odontoid fracture and acute fractures of the bilateral aspects of the posterior arch of C1 vertebral body.  Maxillofacial CT reveals acute mildly displaced right nasal bone fracture, and head CT is negative for acute intracranial abnormality but notable for severe right frontal scalp soft tissue swelling with hematoma.  Neurosurgery was consulted by the ED physician, hard cervical collar was placed. Neuro surgery recommended hard cervical collar at all times and outpatient follow up with Dr Annette Stable.   Assessment & Plan    Assessment and Plan:   Type II Odontoid fracture:C1 fracture:  S/p hard cervical collar.  Pain control. And therapy evaluation.  Neuro surgery consulted , recommended hard cervical collar at all times and outpatient follow up with Dr Annette Stable.  Pain control with IV dilaudid, oral tramadol and tylenol.    Nasal Bone fracture:  Mildly displaced right nasal bone fracture noted on ED.  Follow up with ENT as outpatient.     H/o  chronic diastolic CHF:  She appears compensated.  Continue with strict intake and output and daily weights.    Stage 3b CKD:  Creatinine 1.01 today, improved from yesterday.    PAF: Anticoagulation contraindicated due to recurrent falls.  On aspirin.    Asthma:  No wheezing heard.   Hypertensive Urgency:  Prn hydralazine ordered.    Thrombocytopenia:  Platelets at 130,000. Extensive bruising of the face and peri orbital ecchymosis seen.    Estimated body mass index is 28.34 kg/m as calculated from the following:   Height as of 04/21/21: '5\' 3"'$  (1.6 m).   Weight as of 04/21/21: 72.6 kg.  Code Status: DNR DVT Prophylaxis:  SCDs Start: 07/27/21 2348   Level of Care: Level of care: Med-Surg Family Communication: None at bedside. Discussed with daughter over the phone.   Disposition Plan:     Remains inpatient appropriate:  pain control and SNF placement.   Procedures:  CT head and cervical spine.   Consultants:   Neuro surgery.   Antimicrobials:   Anti-infectives (From admission, onward)    None        Medications  Scheduled Meds:  acetaminophen  650 mg Oral  Q6H   famotidine  20 mg Oral BID   fluticasone furoate-vilanterol  1 puff Inhalation QAC breakfast   montelukast  10 mg Oral QHS   sertraline  50 mg Oral Daily   Continuous Infusions:  methocarbamol (ROBAXIN) IV     PRN Meds:.albuterol, hydrALAZINE, HYDROmorphone (DILAUDID) injection, methocarbamol (ROBAXIN) IV, ondansetron (ZOFRAN) IV, oxyCODONE-acetaminophen, traMADol    Subjective:   [name] was seen and examined today.  She is alert, and oriented to person only. Reports persistent pain , but better than yesterday.  Objective:   Vitals:   07/28/21 2002 07/29/21 0358 07/29/21 0756 07/29/21 1249  BP: (!) 149/62 (!) 146/64 139/61 (!) 145/61  Pulse: 61 60 60 60  Resp: '15 16 16 15  '$ Temp: 98 F (36.7 C) (!) 97.3 F (36.3 C) 98.3 F (36.8 C) 98.2 F (36.8 C)  TempSrc: Oral  Axillary  Oral  SpO2: 95% 96% 99% 97%    Intake/Output Summary (Last 24 hours) at 07/29/2021 1602 Last data filed at 07/29/2021 1500 Gross per 24 hour  Intake 360 ml  Output --  Net 360 ml   There were no vitals filed for this visit.   Exam General exam: elderly woman, with periorbital ecchymosis and bruising over the nose, right frontal scalp hematoma.  Respiratory system: Clear to auscultation. Respiratory effort normal. Cardiovascular system: S1 & S2 heard, RRR. No JVD, No pedal edema. Gastrointestinal system: Abdomen is nondistended, soft and nontender.  Normal bowel sounds heard. Central nervous system: Alert and oriented to person only, able to move all extremities.  Extremities: Symmetric 5 x 5 power. Skin: No rashes, lesions or ulcers Psychiatry: Mood & affect appropriate.      Data Reviewed:  I have personally reviewed following labs and imaging studies   CBC Lab Results  Component Value Date   WBC 6.7 07/29/2021   RBC 3.21 (L) 07/29/2021   HGB 10.4 (L) 07/29/2021   HCT 32.2 (L) 07/29/2021   MCV 100.3 (H) 07/29/2021   MCH 32.4 07/29/2021   PLT 130 (L) 07/29/2021   MCHC 32.3 07/29/2021   RDW 13.8 07/29/2021   LYMPHSABS 1.6 07/27/2021   MONOABS 0.3 07/27/2021   EOSABS 0.1 07/27/2021   BASOSABS 0.0 65/53/7482     Last metabolic panel Lab Results  Component Value Date   NA 136 07/29/2021   K 4.3 07/29/2021   CL 108 07/29/2021   CO2 22 07/29/2021   BUN 19 07/29/2021   CREATININE 1.01 (H) 07/29/2021   GLUCOSE 113 (H) 07/29/2021   GFRNONAA 52 (L) 07/29/2021   GFRAA 37 (L) 06/28/2019   CALCIUM 8.7 (L) 07/29/2021   PROT 6.0 (L) 11/21/2020   ALBUMIN 3.6 11/21/2020   BILITOT 1.2 11/21/2020   ALKPHOS 98 11/21/2020   AST 15 11/21/2020   ALT 11 11/21/2020   ANIONGAP 6 07/29/2021    CBG (last 3)  No results for input(s): "GLUCAP" in the last 72 hours.    Coagulation Profile: No results for input(s): "INR", "PROTIME" in the last 168  hours.   Radiology Studies: CT Maxillofacial Wo Contrast  Result Date: 07/27/2021 CLINICAL DATA:  Status post fall. EXAM: CT MAXILLOFACIAL WITHOUT CONTRAST TECHNIQUE: Multidetector CT imaging of the maxillofacial structures was performed. Multiplanar CT image reconstructions were also generated. RADIATION DOSE REDUCTION: This exam was performed according to the departmental dose-optimization program which includes automated exposure control, adjustment of the mA and/or kV according to patient size and/or use of iterative reconstruction technique. COMPARISON:  None Available. FINDINGS: Osseous:  An acute mildly displaced right-sided nasal bone fracture is seen. Acute displaced fracture deformities are seen involving the bilateral aspects of the anterior and posterior arches of C1. An additional fracture is seen extending through the base of the dens and body of C2. Orbits: Negative. No traumatic or inflammatory finding. Sinuses: Clear. Soft tissues: There is mild bilateral paranasal soft tissue thickening. Marked severity soft tissue swelling is also seen, to the right of midline. An associated right frontal scalp hematoma is noted. Limited intracranial: No significant or unexpected finding. IMPRESSION: 1. Acute nasal bone fracture and fracture deformities of the C1 and C2 vertebral bodies, as described above. 2. Marked severity right frontal scalp soft tissue swelling, with an associated right frontal scalp hematoma. Electronically Signed   By: Virgina Norfolk M.D.   On: 07/27/2021 21:31   CT Cervical Spine Wo Contrast  Result Date: 07/27/2021 CLINICAL DATA:  Status post fall. EXAM: CT CERVICAL SPINE WITHOUT CONTRAST TECHNIQUE: Multidetector CT imaging of the cervical spine was performed without intravenous contrast. Multiplanar CT image reconstructions were also generated. RADIATION DOSE REDUCTION: This exam was performed according to the departmental dose-optimization program which includes automated  exposure control, adjustment of the mA and/or kV according to patient size and/or use of iterative reconstruction technique. COMPARISON:  January 05, 2021 FINDINGS: Alignment: Normal. Skull base and vertebrae: An acute, nondisplaced fracture deformity is seen extending through the junction of the base of the dens and body of C2. Acute comminuted fracture deformities are also seen involving the bilateral aspects of the anterior and posterior arches of the C1 vertebral body. Soft tissues and spinal canal: No prevertebral fluid or swelling. No visible canal hematoma. Disc levels: Very mild, anterior osteophyte formation is seen at the levels of C5-C6 and C6-C7. There is marked severity narrowing of the anterior atlantoaxial articulation. Very mild multilevel intervertebral disc space narrowing is seen throughout the remainder of the cervical spine. Marked to severity multilevel bilateral facet joint hypertrophy is noted, left greater than right. Upper chest: Negative. Other: A chronic posterior third left rib fracture is seen. IMPRESSION: 1. Acute type 2 odontoid fracture (unstable). 2. Acute fractures involving the bilateral aspects of the anterior and posterior arches of the C1 vertebral body. 3. Marked to severity multilevel bilateral facet joint hypertrophy, left greater than right. 4. Very mild, anterior osteophyte formation at the levels of C5-C6 and C6-C7. 5. Chronic posterior third left rib fracture. Electronically Signed   By: Virgina Norfolk M.D.   On: 07/27/2021 21:08   CT Head Wo Contrast  Result Date: 07/27/2021 CLINICAL DATA:  Status post fall. EXAM: CT HEAD WITHOUT CONTRAST TECHNIQUE: Contiguous axial images were obtained from the base of the skull through the vertex without intravenous contrast. RADIATION DOSE REDUCTION: This exam was performed according to the departmental dose-optimization program which includes automated exposure control, adjustment of the mA and/or kV according to patient  size and/or use of iterative reconstruction technique. COMPARISON:  None Available. FINDINGS: Brain: There is mild cerebral atrophy with widening of the extra-axial spaces and ventricular dilatation. There are areas of decreased attenuation within the white matter tracts of the supratentorial brain, consistent with microvascular disease changes. Small, bilateral, chronic basal ganglia lacunar infarcts are seen. Vascular: No hyperdense vessel or unexpected calcification. Skull: Negative for acute skull fracture. Acute fracture deformity is seen extending through the junction of the base of the dens and body of the C2 vertebral body. Sinuses/Orbits: No acute finding. Other: Marked severity frontal scalp soft tissue swelling is  seen, to the right of midline. An associated 3.3 cm x 1.6 cm right frontal scalp hematoma is noted. IMPRESSION: 1. Acute Type 2 odontoid fracture (unstable). 2. Marked severity right frontal scalp soft tissue swelling, with an associated 3.3 cm x 1.6 cm right frontal scalp hematoma. 3. Generalized cerebral atrophy without acute intracranial abnormality. Electronically Signed   By: Virgina Norfolk M.D.   On: 07/27/2021 20:55   DG Knee Complete 4 Views Right  Result Date: 07/27/2021 CLINICAL DATA:  Fall EXAM: RIGHT KNEE - COMPLETE 4+ VIEW COMPARISON:  02/09/2018 FINDINGS: No fracture or malalignment. Tricompartment arthritis of the knee with severe joint space narrowing involving the medial joint space with moderate patellofemoral degenerative change. No sizable knee effusion. IMPRESSION: Tricompartment arthritis.  No acute osseous abnormality. Electronically Signed   By: Donavan Foil M.D.   On: 07/27/2021 20:28       Hosie Poisson M.D. [facility] 07/29/2021, 4:02 PM  Available via Epic secure chat 7am-7pm After 7 pm, please refer to night coverage provider listed on amion.

## 2021-07-29 NOTE — Plan of Care (Signed)

## 2021-07-30 ENCOUNTER — Inpatient Hospital Stay (HOSPITAL_COMMUNITY): Payer: Medicare Other

## 2021-07-30 ENCOUNTER — Telehealth: Payer: Self-pay | Admitting: Hospice

## 2021-07-30 DIAGNOSIS — I5032 Chronic diastolic (congestive) heart failure: Secondary | ICD-10-CM | POA: Diagnosis not present

## 2021-07-30 DIAGNOSIS — S12110A Anterior displaced Type II dens fracture, initial encounter for closed fracture: Secondary | ICD-10-CM | POA: Diagnosis not present

## 2021-07-30 DIAGNOSIS — J45909 Unspecified asthma, uncomplicated: Secondary | ICD-10-CM | POA: Diagnosis not present

## 2021-07-30 LAB — CBC
HCT: 36.1 % (ref 36.0–46.0)
Hemoglobin: 11.6 g/dL — ABNORMAL LOW (ref 12.0–15.0)
MCH: 32.3 pg (ref 26.0–34.0)
MCHC: 32.1 g/dL (ref 30.0–36.0)
MCV: 100.6 fL — ABNORMAL HIGH (ref 80.0–100.0)
Platelets: 131 10*3/uL — ABNORMAL LOW (ref 150–400)
RBC: 3.59 MIL/uL — ABNORMAL LOW (ref 3.87–5.11)
RDW: 13.9 % (ref 11.5–15.5)
WBC: 6.3 10*3/uL (ref 4.0–10.5)
nRBC: 0 % (ref 0.0–0.2)

## 2021-07-30 LAB — AMMONIA: Ammonia: 21 umol/L (ref 9–35)

## 2021-07-30 LAB — BASIC METABOLIC PANEL
Anion gap: 10 (ref 5–15)
BUN: 28 mg/dL — ABNORMAL HIGH (ref 8–23)
CO2: 20 mmol/L — ABNORMAL LOW (ref 22–32)
Calcium: 8.7 mg/dL — ABNORMAL LOW (ref 8.9–10.3)
Chloride: 103 mmol/L (ref 98–111)
Creatinine, Ser: 1.24 mg/dL — ABNORMAL HIGH (ref 0.44–1.00)
GFR, Estimated: 41 mL/min — ABNORMAL LOW (ref >60)
Glucose, Bld: 112 mg/dL — ABNORMAL HIGH (ref 70–99)
Potassium: 5 mmol/L (ref 3.5–5.1)
Sodium: 133 mmol/L — ABNORMAL LOW (ref 135–145)

## 2021-07-30 LAB — VITAMIN B12: Vitamin B-12: 189 pg/mL (ref 180–914)

## 2021-07-30 LAB — TSH: TSH: 1.124 u[IU]/mL (ref 0.350–4.500)

## 2021-07-30 MED ORDER — HALOPERIDOL LACTATE 5 MG/ML IJ SOLN
1.0000 mg | Freq: Four times a day (QID) | INTRAMUSCULAR | Status: DC | PRN
  Administered 2021-07-30: 1 mg via INTRAVENOUS
  Filled 2021-07-30: qty 1

## 2021-07-30 MED ORDER — FENTANYL CITRATE PF 50 MCG/ML IJ SOSY: 25.0000 ug | PREFILLED_SYRINGE | INTRAMUSCULAR | Status: DC | PRN

## 2021-07-30 MED ORDER — SODIUM CHLORIDE 0.9 % IV SOLN: INTRAVENOUS | Status: DC

## 2021-07-30 MED ORDER — ACETAMINOPHEN 10 MG/ML IV SOLN
1000.0000 mg | Freq: Four times a day (QID) | INTRAVENOUS | Status: AC | PRN
  Administered 2021-07-30 – 2021-07-31 (×2): 1000 mg via INTRAVENOUS
  Filled 2021-07-30 (×4): qty 100

## 2021-07-30 MED ORDER — IPRATROPIUM-ALBUTEROL 0.5-2.5 (3) MG/3ML IN SOLN: 3.0000 mL | Freq: Four times a day (QID) | RESPIRATORY_TRACT | Status: DC | PRN

## 2021-07-30 NOTE — Telephone Encounter (Signed)
[facility] called NP to inform that patient is in the hospital following a fall and likely would be discharged after hospital stay, to SNF for rehab

## 2021-07-30 NOTE — Plan of Care (Signed)

## 2021-07-30 NOTE — Plan of Care (Signed)

## 2021-07-30 NOTE — Progress Notes (Signed)
Occupational Therapy Treatment Patient Details Name: LEGACY CARRENDER MRN: [id number] DOB: 11/08/1927 Today's Date: 07/30/2021   History of present illness Pt is a 88 y.o. F who presents after a fall. CT scan of c-spine revealed type 2 odontoid fractures. Also noted mildly displaced right nasal bone fracture. CT head negative for acute abnormality. Significant PMH: atrial fibrillation, symptomatic bradycardia with pacemaker, chronic diastolic CHF, HTN, asthma, CKD stage IIIb.   OT comments  Session limited this AM d/t pt confusion and agitation. Initially, pt pleasant and agreeable to engage in therapy. However, once initiating movement to sit EOB, pt actively resisting against assistance to lift trunk. Pt responded "no" to all other activities attempted (swatting therapist's hand away), including switching from ill-fitting cervical collar to smaller size that was provided for pt yesterday. Attempted to call family during session to reorient pt though no answer received. Continue to rec SNF rehab as pt requiring increased physical assistance today and noted confusion posing increased fall risk.   Recommendations for follow up therapy are one component of a multi-disciplinary discharge planning process, led by the attending physician.  Recommendations may be updated based on patient status, additional functional criteria and insurance authorization.    Follow Up Recommendations  Skilled nursing-short term rehab (<3 hours/day)    Assistance Recommended at Discharge Frequent or constant Supervision/Assistance  Patient can return home with the following  A lot of help with walking and/or transfers;A lot of help with bathing/dressing/bathroom;Assistance with feeding   Equipment Recommendations  None recommended by OT    Recommendations for Other Services      Precautions / Restrictions Precautions Precautions: Cervical;Fall Precaution Booklet Issued: Yes (comment) Required Braces or Orthoses:  Cervical Brace Cervical Brace: Hard collar Restrictions Weight Bearing Restrictions: No       Mobility Bed Mobility Overal bed mobility: Needs Assistance Bed Mobility: Supine to Sit           General bed mobility comments: attempted to initiate sitting EOB to switch cervical collars. However, after initially moving LEs to EOB, pt actively resistant to being assisted to lift trunk and began saying "no" to all movement and even simple ADLs. Total A x 2 to scoot higher in bed with pt holding to bedrail with R hand and unable to be redirected from this    Transfers                   General transfer comment: unable d/t pt resistance     Balance                                           ADL either performed or assessed with clinical judgement   ADL Overall ADL's : Needs assistance/impaired                                            Extremity/Trunk Assessment Upper Extremity Assessment Upper Extremity Assessment: Generalized weakness   Lower Extremity Assessment Lower Extremity Assessment: Defer to PT evaluation        Vision   Vision Assessment?: No apparent visual deficits   Perception     Praxis      Cognition Arousal/Alertness: Awake/alert Behavior During Therapy: Agitated Overall Cognitive Status: Impaired/Different from baseline Area of Impairment: Following commands, Memory, Awareness,  Safety/judgement, Problem solving, Attention, Orientation                 Orientation Level: Disoriented to, Place, Time, Situation Current Attention Level: Sustained Memory: Decreased recall of precautions, Decreased short-term memory Following Commands: Follows one step commands with increased time, Follows one step commands inconsistently Safety/Judgement: Decreased awareness of deficits, Decreased awareness of safety Awareness: Intellectual Problem Solving: Slow processing, Requires verbal cues, Decreased initiation,  Requires tactile cues General Comments: STM deficits at baseline. initially pleasant, following commands but with initiation of movement, pt became resistant, agitated and saying "no" repeatedly to all questions and swatting therapist hand away when attempting to switch cervical collars (to smaller side)        Exercises      Shoulder Instructions       General Comments Noted smaller sized cervical collar in room, planned to adjsut as current collar riding up pt's chin. pt reports current collar not comfortable but swatted therapist's hand away when attempting to assist with collar mgmt.    Pertinent Vitals/ Pain       Pain Assessment Pain Assessment: Faces Faces Pain Scale: Hurts even more Pain Location: neck Pain Descriptors / Indicators: Grimacing, Guarding Pain Intervention(s): Monitored during session  Home Living                                          Prior Functioning/Environment              Frequency  Min 2X/week        Progress Toward Goals  OT Goals(current goals can now be found in the care plan section)  Progress towards OT goals: OT to reassess next treatment  Acute Rehab OT Goals Patient Stated Goal: "no" OT Goal Formulation: With patient Time For Goal Achievement: 08/12/21 Potential to Achieve Goals: Good ADL Goals Pt Will Perform Grooming: with modified independence;standing Pt Will Transfer to Toilet: ambulating;with set-up Pt Will Perform Toileting - Clothing Manipulation and hygiene: with set-up;sit to/from stand;sitting/lateral leans Additional ADL Goal #1: Pt to implement cervical precautions during ADL/mobility with no more than min verbal cues  Plan Discharge plan remains appropriate    Co-evaluation                 AM-PAC OT "6 Clicks" Daily Activity     Outcome Measure   Help from another person eating meals?: A Little Help from another person taking care of personal grooming?: A Lot Help from another  person toileting, which includes using toliet, bedpan, or urinal?: A Lot Help from another person bathing (including washing, rinsing, drying)?: A Lot Help from another person to put on and taking off regular upper body clothing?: A Lot Help from another person to put on and taking off regular lower body clothing?: A Lot 6 Click Score: 13    End of Session    OT Visit Diagnosis: Unsteadiness on feet (R26.81);Other abnormalities of gait and mobility (R26.89);Muscle weakness (generalized) (M62.81);History of falling (Z91.81)   Activity Tolerance Treatment limited secondary to agitation   Patient Left in bed;with call bell/phone within reach;with bed alarm set   Nurse Communication          Time: 2952-8413 OT Time Calculation (min): 20 min  Charges: OT General Charges $OT Visit: 1 Visit OT Treatments $Therapeutic Activity: 8-22 mins  Malachy Chamber, OTR/L Acute Rehab Services Office: [phone]   Almyra Free  Justun Anaya 07/30/2021, 8:34 AM

## 2021-07-30 NOTE — TOC Progression Note (Addendum)
Transition of Care [facility]) - Progression Note    Patient Details  Name: [name] MRN: [id number] Date of Birth: [birth year]  [facility] CM/SW Contact  Joanne Chars, LCSW Phone Number: 07/30/2021, 10:41 AM  Clinical Narrative:   CSW spoke with pt granddaughter [name].  Discussed bed offers and she is planning to visit [facility] this AM.  Choice document emailed to [name], offers sent by text message.  1315: CSW received call from [name] that they want to accept offer at [facility].  CSW reached out to Michelle/Ashton to confirm bed for tomorrow.   Expected Discharge Plan: [place] Barriers to Discharge: Continued Medical Work up, SNF Pending bed offer  Expected Discharge Plan and Services Expected Discharge Plan: [place] In-house Referral: Clinical Social Work     Living arrangements for the past 2 months: Single Family Home                                       Social Determinants of Health (SDOH) Interventions    Readmission Risk Interventions     No data to display

## 2021-07-30 NOTE — Progress Notes (Signed)
[facility]                                                                               [name], is a 88 y.o. female, DOB - July 23, 1927, ZDG:[id number] Admit date - 07/27/2021    Outpatient Primary MD for the patient is [name], MD  LOS - 3  days    Brief summary   [name] is a pleasant 88 y.o. female with medical history significant for atrial fibrillation no longer anticoagulated, symptomatic bradycardia with pacemaker, chronic diastolic CHF, hypertension, asthma, and CKD stage IIIb, now presenting to the emergency department after a fall.   Patient was in her usual state and having an uneventful day when her daughter heard her shuffling without her walker, went to check on her, and witnessed her fall forward and strike her face on the ground.  There was no loss of consciousness.  CBC features a stable microcytic anemia and slight thrombocytopenia.  CT cervical spine is concerning for acute type II odontoid fracture and acute fractures of the bilateral aspects of the posterior arch of C1 vertebral body.  Maxillofacial CT reveals acute mildly displaced right nasal bone fracture, and head CT is negative for acute intracranial abnormality but notable for severe right frontal scalp soft tissue swelling with hematoma.  Neurosurgery was consulted by the ED physician, hard cervical collar was placed. Neuro surgery recommended hard cervical collar at all times and outpatient follow up with Dr Annette Stable.  Pt seen and examined, confused this morning. Only able to tell me her name and month of her DOB. As per RN she refused all her meds . She denies any pain.   Assessment & Plan    Assessment and Plan:   Type II Odontoid fracture:C1 fracture:  S/p hard cervical collar.  Pain control. And therapy evaluation. Therapy evaluations recommending SNF.  Neuro surgery consulted , recommended hard cervical collar at all times and outpatient follow up with Dr Annette Stable.   Pain control with IV dilaudid, oral tramadol and tylenol. Pt currently refusing cervical collar, pain meds, appears confused and delirious.  She denies any pain.   Nasal Bone fracture:  Mildly displaced right nasal bone fracture noted on ED.  Follow up with ENT as outpatient.     H/o chronic diastolic CHF:  She appears compensated.  Continue with strict intake and output and daily weights.    Stage 3b CKD:  Creatinine stable around 1.2.    PAF: Anticoagulation contraindicated due to recurrent falls.  On aspirin.    Asthma:  No wheezing heard. On RA. Some coughing, will get CXR.   Hypertensive Urgency:  Prn hydralazine ordered.    Thrombocytopenia:  Platelets at 131,000. Extensive bruising of the face and peri orbital ecchymosis seen.  Frontal scalp hematoma.    Acute metabolic encephalopathy.  Probably hospital delirium, ? Narcotic pain meds.  Re orientation efforts , avoid narcotics.  Added scheduled tylenol, but patient is refusing any meds since this morning.  Obtain b12 , TSH and ammonia levels.    Macrocytic anemia:  Will gt B12 levels, TSH,    Estimated body mass index is 28.34 kg/m as calculated from  the following:   Height as of 04/21/21: '5\' 3"'$  (1.6 m).   Weight as of 04/21/21: 72.6 kg.  Code Status: DNR DVT Prophylaxis:  SCDs Start: 07/27/21 2348   Level of Care: Level of care: Med-Surg Family Communication: None at bedside. Discussed with daughter over the phone.   Disposition Plan:     Remains inpatient appropriate:  pain control and SNF placement.   Procedures:  CT head and cervical spine.   Consultants:   Neuro surgery.   Antimicrobials:   Anti-infectives (From admission, onward)    None        Medications  Scheduled Meds:  acetaminophen  650 mg Oral Q6H   famotidine  20 mg Oral BID   fluticasone furoate-vilanterol  1 puff Inhalation QAC breakfast   montelukast  10 mg Oral QHS   sertraline  50 mg Oral Daily    Continuous Infusions:  methocarbamol (ROBAXIN) IV     PRN Meds:.albuterol, hydrALAZINE, HYDROmorphone (DILAUDID) injection, methocarbamol (ROBAXIN) IV, ondansetron (ZOFRAN) IV, oxyCODONE-acetaminophen, traMADol    Subjective:   [name] was seen and examined today.  She Is alert and oriented to person only.  Confused, restless.  Objective:   Vitals:   07/29/21 1249 07/29/21 1957 07/30/21 0352 07/30/21 0811  BP: (!) 145/61 138/60 (!) 158/65 (!) 169/72  Pulse: 60 62 60 60  Resp: '15 16 19 18  '$ Temp: 98.2 F (36.8 C) 98.3 F (36.8 C) 97.8 F (36.6 C) 97.8 F (36.6 C)  TempSrc: Oral Oral  Oral  SpO2: 97% 98% 97% 94%    Intake/Output Summary (Last 24 hours) at 07/30/2021 1137 Last data filed at 07/29/2021 1500 Gross per 24 hour  Intake 120 ml  Output --  Net 120 ml    There were no vitals filed for this visit.   Exam General exam: Elderly woman, deconditioned, with bruising of the face and peri orbital ecchymosis and frontal scalp hematoma.  Respiratory system: Clear to auscultation. Respiratory effort normal. Cardiovascular system: S1 & S2 heard, RRR. No JVD, No pedal edema. Gastrointestinal system: Abdomen is nondistended, soft and nontender. Normal bowel sounds heard. Central nervous system: Alert and oriented to self only. Confused. Able to move all extremities.  Extremities: Symmetric 5 x 5 power. Skin: No rashes Psychiatry: restless, agitated.        Data Reviewed:  I have personally reviewed following labs and imaging studies   CBC Lab Results  Component Value Date   WBC 6.3 07/30/2021   RBC 3.59 (L) 07/30/2021   HGB 11.6 (L) 07/30/2021   HCT 36.1 07/30/2021   MCV 100.6 (H) 07/30/2021   MCH 32.3 07/30/2021   PLT 131 (L) 07/30/2021   MCHC 32.1 07/30/2021   RDW 13.9 07/30/2021   LYMPHSABS 1.6 07/27/2021   MONOABS 0.3 07/27/2021   EOSABS 0.1 07/27/2021   BASOSABS 0.0 82/50/5397     Last metabolic panel Lab Results  Component Value Date    NA 133 (L) 07/30/2021   K 5.0 07/30/2021   CL 103 07/30/2021   CO2 20 (L) 07/30/2021   BUN 28 (H) 07/30/2021   CREATININE 1.24 (H) 07/30/2021   GLUCOSE 112 (H) 07/30/2021   GFRNONAA 41 (L) 07/30/2021   GFRAA 37 (L) 06/28/2019   CALCIUM 8.7 (L) 07/30/2021   PROT 6.0 (L) 11/21/2020   ALBUMIN 3.6 11/21/2020   BILITOT 1.2 11/21/2020   ALKPHOS 98 11/21/2020   AST 15 11/21/2020   ALT 11 11/21/2020   ANIONGAP 10 07/30/2021  CBG (last 3)  No results for input(s): "GLUCAP" in the last 72 hours.    Coagulation Profile: No results for input(s): "INR", "PROTIME" in the last 168 hours.   Radiology Studies: No results found.     Hosie Poisson M.D. [facility] 07/30/2021, 11:37 AM  Available via Epic secure chat 7am-7pm After 7 pm, please refer to night coverage provider listed on amion.

## 2021-07-30 NOTE — Progress Notes (Signed)
Pt refused scheduled meds/meals at this time. Pt is combative and refused chest xray. Pt is currently on bilateral mitten restraints for attempting to pull cervical collar. Attending MD made aware of above.

## 2021-07-30 NOTE — Care Management Important Message (Signed)
Important Message  Patient Details  Name: [name] MRN: [id number] Date of Birth: 1927/02/10   Medicare Important Message Given:  Yes     Elayjah Chaney Stefan Church 07/30/2021, 2:40 PM

## 2021-07-31 DIAGNOSIS — S12110A Anterior displaced Type II dens fracture, initial encounter for closed fracture: Secondary | ICD-10-CM | POA: Diagnosis not present

## 2021-07-31 DIAGNOSIS — Z515 Encounter for palliative care: Secondary | ICD-10-CM

## 2021-07-31 DIAGNOSIS — Z66 Do not resuscitate: Secondary | ICD-10-CM

## 2021-07-31 DIAGNOSIS — I5032 Chronic diastolic (congestive) heart failure: Secondary | ICD-10-CM | POA: Diagnosis not present

## 2021-07-31 DIAGNOSIS — Z7189 Other specified counseling: Secondary | ICD-10-CM

## 2021-07-31 DIAGNOSIS — J45909 Unspecified asthma, uncomplicated: Secondary | ICD-10-CM | POA: Diagnosis not present

## 2021-07-31 MED ORDER — SENNOSIDES-DOCUSATE SODIUM 8.6-50 MG PO TABS
2.0000 | ORAL_TABLET | Freq: Two times a day (BID) | ORAL | Status: DC
  Administered 2021-07-31 – 2021-08-01 (×3): 2 via ORAL
  Filled 2021-07-31 (×3): qty 2

## 2021-07-31 MED ORDER — POLYETHYLENE GLYCOL 3350 17 G PO PACK
17.0000 g | PACK | Freq: Every day | ORAL | Status: DC
  Administered 2021-08-01: 17 g via ORAL
  Filled 2021-07-31 (×2): qty 1

## 2021-07-31 MED ORDER — FERROUS SULFATE 325 (65 FE) MG PO TABS
325.0000 mg | ORAL_TABLET | Freq: Every day | ORAL | Status: DC
  Administered 2021-07-31 – 2021-08-03 (×4): 325 mg via ORAL
  Filled 2021-07-31 (×4): qty 1

## 2021-07-31 MED ORDER — ACETAMINOPHEN 325 MG PO TABS
650.0000 mg | ORAL_TABLET | ORAL | Status: DC | PRN
  Administered 2021-07-31 – 2021-08-03 (×9): 650 mg via ORAL
  Filled 2021-07-31 (×10): qty 2

## 2021-07-31 MED ORDER — BISACODYL 10 MG RE SUPP: 10.0000 mg | Freq: Every day | RECTAL | Status: DC | PRN

## 2021-07-31 MED ORDER — ALLOPURINOL 100 MG PO TABS
100.0000 mg | ORAL_TABLET | Freq: Every day | ORAL | Status: DC
  Administered 2021-07-31 – 2021-08-03 (×4): 100 mg via ORAL
  Filled 2021-07-31 (×4): qty 1

## 2021-07-31 MED ORDER — TRAMADOL HCL 50 MG PO TABS
25.0000 mg | ORAL_TABLET | Freq: Two times a day (BID) | ORAL | Status: DC | PRN
  Administered 2021-07-31 – 2021-08-03 (×6): 25 mg via ORAL
  Filled 2021-07-31 (×6): qty 1

## 2021-07-31 MED ORDER — CYANOCOBALAMIN 1000 MCG/ML IJ SOLN
1000.0000 ug | Freq: Once | INTRAMUSCULAR | Status: AC
Start: 2021-07-31 — End: 2021-07-31
  Administered 2021-07-31: 1000 ug via INTRAMUSCULAR
  Filled 2021-07-31: qty 1

## 2021-07-31 MED ORDER — ALENDRONATE SODIUM 70 MG PO TABS: 70.0000 mg | ORAL_TABLET | ORAL | Status: DC

## 2021-07-31 MED ORDER — VITAMIN B-12 1000 MCG PO TABS
1000.0000 ug | ORAL_TABLET | Freq: Every day | ORAL | Status: DC
  Administered 2021-08-01 – 2021-08-03 (×3): 1000 ug via ORAL
  Filled 2021-07-31 (×3): qty 1

## 2021-07-31 MED ORDER — EMPAGLIFLOZIN 10 MG PO TABS
10.0000 mg | ORAL_TABLET | Freq: Every day | ORAL | Status: DC
  Administered 2021-08-01 – 2021-08-03 (×3): 10 mg via ORAL
  Filled 2021-07-31 (×3): qty 1

## 2021-07-31 NOTE — Consult Note (Signed)
Consultation Note Date: 07/31/2021   Patient Name: [name]  DOB: Jan 01, 1928  MRN: [id number]  Age / Sex: 88 y.o., female  PCP: [name], MD Referring Physician: Hosie Poisson, MD  Reason for Consultation: Establishing goals of care  HPI/Patient Profile: 88 y.o. female  with past medical history of for atrial fibrillation no longer anticoagulated, symptomatic bradycardia with pacemaker, chronic diastolic CHF, hypertension, asthma, and CKD stage IIIb admitted on 07/27/2021 with fall.  CT cervical spine is concerning for acute type II odontoid fracture and acute fractures of the bilateral aspects of the posterior arch of C1 vertebral body.  Maxillofacial CT reveals acute mildly displaced right nasal bone fracture, and head CT is negative for acute intracranial abnormality but notable for severe right frontal scalp soft tissue swelling with hematoma.  PMT consulted for goals of care.  Clinical Assessment and Goals of Care: I have reviewed medical records including EPIC notes, labs and imaging, assessed the patient and then met with patient and son at bedside to discuss diagnosis prognosis, GOC, EOL wishes, disposition and options.  Patient tells me she has some pain but "is not that bad", controlled by medication.  I introduced Palliative Medicine as specialized medical care for people living with serious illness. It focuses on providing relief from the symptoms and stress of a serious illness. The goal is to improve quality of life for both the patient and the family.  As far as functional and nutritional status son confirms patient remained functional prior to admission.  Ambulated with a walker.  Lives with daughter and granddaughter.   We discussed patient's current illness and what it means in the larger context of patient's on-going co-morbidities.   I attempted to elicit values and goals of care important to the patient.     Advance directives, concepts specific to code status, artificial feeding and hydration, and rehospitalization were considered and discussed.  Introduced a MOST form that goes over these topics.  Explained form to son.  He will review form with patient's daughter [name].  Son confirmed plan to go to rehab with palliative care to continue to follow outpatient.  Questions and concerns were addressed. The family was encouraged to call with questions or concerns.  Primary Decision Maker HCPOA daughter [name]    SUMMARY OF RECOMMENDATIONS   Planning for rehab, outpatient palliative to continue to follow MOST form introduced, we will follow-up tomorrow  Code Status/Advance Care Planning: DNR  Discharge Planning: [place] for rehab with Palliative care service follow-up      Primary Diagnoses: Present on Admission:  Closed odontoid fracture with type II morphology (HCC)  Closed C1 fracture ([place])  Nasal bone fracture  Chronic diastolic CHF (congestive heart failure), NYHA class 2 (HCC)  Stage 3b chronic kidney disease (CKD) (HCC)  Anemia, unspecified  Thrombocytopenia (HCC)  Asthma  PAROXYSMAL ATRIAL FIBRILLATION   I have reviewed the medical record, interviewed the patient and family, and examined the patient. The following aspects are pertinent.  Past Medical History:  Diagnosis Date   Anemia    "I stay anemic"   Arthritis    Asthma    no problems recent   Cataract    Chronic diastolic CHF (congestive heart failure), NYHA class 2 ([place]) 03/19/2014   Echo 4/19: mod LVH, EF 55-60, no RWMA, Gr 1 DD, MAC, trivial MR, mod LAE, normal RVSF, PASP 19   CKD (chronic kidney disease), stage III (HCC)    Complication of anesthesia    CONFUSED  AFTER KNEE SURGERY   Depression    Diverticulitis    DVT (deep venous thrombosis) (HCC)    from birth control, left groin   Essential hypertension    Fibromyalgia 10 y.a.   GERD (gastroesophageal reflux disease)    Gout    Hx  of cardiovascular stress test    Lexiscan Myoview (8/15): apical attenuation artifact, no ischemia, EF 69% - low risk.   Hypercholesteremia    Odynophagia    Other abnormal glucose    PAF (paroxysmal atrial fibrillation) (HCC)    Personal history of other diseases of digestive system    Pneumonia    hx of   Presence of permanent cardiac pacemaker    Sinus bradycardia    Skin cancer 2014   skin R elbow & face   Symptomatic bradycardia 06/2016   Social History   Socioeconomic History   Marital status: Widowed    Spouse name: Not on file   Number of children: Not on file   Years of education: Not on file   Highest education level: Not on file  Occupational History   Not on file  Tobacco Use   Smoking status: Former    Packs/day: 1.00    Years: 10.00    Total pack years: 10.00    Types: Cigarettes    Quit date: 02/08/1973    Years since quitting: 48.5   Smokeless tobacco: Never  Vaping Use   Vaping Use: Never used  Substance and Sexual Activity   Alcohol use: No   Drug use: No   Sexual activity: Not on file  Other Topics Concern   Not on file  Social History Narrative   Not on file   Social Determinants of Health   Financial Resource Strain: Not on file  Food Insecurity: Not on file  Transportation Needs: Not on file  Physical Activity: Not on file  Stress: Not on file  Social Connections: Not on file   Family History  Problem Relation Age of Onset   Heart disease Mother    Hypertension Mother    Fainting Mother    Arrhythmia Mother    Diabetes Father    Stroke Father    Hypertension Father    Cancer Brother    Sudden death Brother    Heart attack Brother    Cancer Sister    Diabetes Sister    Asthma Sister    Scheduled Meds:  allopurinol  100 mg Oral Daily   [START ON 08/01/2021] empagliflozin  10 mg Oral QAC breakfast   famotidine  20 mg Oral BID   ferrous sulfate  325 mg Oral Daily   fluticasone furoate-vilanterol  1 puff Inhalation QAC breakfast    montelukast  10 mg Oral QHS   polyethylene glycol  17 g Oral Daily   senna-docusate  2 tablet Oral BID   sertraline  50 mg Oral Daily   [START ON 08/01/2021] vitamin B-12  1,000 mcg Oral Daily   Continuous Infusions:  sodium chloride 75 mL/hr at 07/30/21 2200   methocarbamol (ROBAXIN) IV 500 mg (07/31/21 1046)   PRN Meds:.acetaminophen, albuterol, bisacodyl, fentaNYL (SUBLIMAZE) injection, haloperidol lactate, hydrALAZINE, HYDROmorphone (DILAUDID) injection, ipratropium-albuterol, methocarbamol (ROBAXIN) IV, ondansetron (ZOFRAN) IV, traMADol Allergies  Allergen Reactions   Sulfonamide Derivatives Shortness Of Breath and Rash   Amoxicillin Other (See Comments)    Very weak Has patient had a PCN reaction causing immediate rash, facial/tongue/throat swelling, SOB or lightheadedness with hypotension: no Has patient had a PCN reaction  causing severe rash involving mucus membranes or skin necrosis: no Has patient had a PCN reaction that required hospitalization: no Has patient had a PCN reaction occurring within the last 10 years: no If all of the above answers are "NO", then may proceed with Cephalosporin use.    Diltiazem Hcl Other (See Comments)    Reaction unknown   Losartan Potassium Other (See Comments)    Potassium level increased drastically   Potassium-Containing Compounds Other (See Comments)    Pt is very sensitive to potassium products. Her Potassium rises very quickly and takes a long time to come down.   Zetia [Ezetimibe] Other (See Comments)    Weakness   Penicillins Rash    DID THE REACTION INVOLVE: Swelling of the face/tongue/throat, SOB, or low BP? Unknown Sudden or severe rash/hives, skin peeling, or the inside of the mouth or nose? yes Did it require medical treatment? Unknown When did it last happen?      unknown If all above answers are "NO", may proceed with cephalosporin use.    Review of Systems  Unable to perform ROS: Dementia    Physical  Exam Constitutional:      General: She is not in acute distress.    Appearance: She is ill-appearing.  HENT:     Head:     Comments: Ecchymosis covering face  Pulmonary:     Effort: Pulmonary effort is normal.  Skin:    General: Skin is warm and dry.  Neurological:     Mental Status: She is alert.     Vital Signs: BP (!) 174/72 (BP Location: Right Arm)   Pulse 66   Temp 98 F (36.7 C) (Oral)   Resp 18   SpO2 97%  Pain Scale: PAINAD POSS *See Group Information*: S-Acceptable,Sleep, easy to arouse Pain Score: Asleep   SpO2: SpO2: 97 % O2 Device:SpO2: 97 % O2 Flow Rate: .   IO: Intake/output summary:  Intake/Output Summary (Last 24 hours) at 07/31/2021 1817 Last data filed at 07/31/2021 1500 Gross per 24 hour  Intake 2308.35 ml  Output 1000 ml  Net 1308.35 ml    LBM: Last BM Date :  (PTA) Baseline Weight:   Most recent weight:       Palliative Assessment/Data: PPS 50%    *Please note that this is a verbal dictation therefore any spelling or grammatical errors are due to the "[employer]" system interpretation.   Juel Burrow, DNP, AGNP-C Palliative Medicine Team [phone] Pager: [phone]

## 2021-08-01 DIAGNOSIS — S12110A Anterior displaced Type II dens fracture, initial encounter for closed fracture: Secondary | ICD-10-CM | POA: Diagnosis not present

## 2021-08-01 DIAGNOSIS — Z515 Encounter for palliative care: Secondary | ICD-10-CM | POA: Diagnosis not present

## 2021-08-01 DIAGNOSIS — F03918 Unspecified dementia, unspecified severity, with other behavioral disturbance: Secondary | ICD-10-CM

## 2021-08-01 DIAGNOSIS — I5032 Chronic diastolic (congestive) heart failure: Secondary | ICD-10-CM | POA: Diagnosis not present

## 2021-08-01 DIAGNOSIS — Z7189 Other specified counseling: Secondary | ICD-10-CM | POA: Diagnosis not present

## 2021-08-01 DIAGNOSIS — S12031D Nondisplaced posterior arch fracture of first cervical vertebra, subsequent encounter for fracture with routine healing: Secondary | ICD-10-CM | POA: Diagnosis not present

## 2021-08-01 LAB — CBC WITH DIFFERENTIAL/PLATELET
Abs Immature Granulocytes: 0.04 10*3/uL (ref 0.00–0.07)
Basophils Absolute: 0 10*3/uL (ref 0.0–0.1)
Basophils Relative: 0 %
Eosinophils Absolute: 0.1 10*3/uL (ref 0.0–0.5)
Eosinophils Relative: 2 %
HCT: 32.4 % — ABNORMAL LOW (ref 36.0–46.0)
Hemoglobin: 10.7 g/dL — ABNORMAL LOW (ref 12.0–15.0)
Immature Granulocytes: 1 %
Lymphocytes Relative: 21 %
Lymphs Abs: 1.5 10*3/uL (ref 0.7–4.0)
MCH: 32.4 pg (ref 26.0–34.0)
MCHC: 33 g/dL (ref 30.0–36.0)
MCV: 98.2 fL (ref 80.0–100.0)
Monocytes Absolute: 0.5 10*3/uL (ref 0.1–1.0)
Monocytes Relative: 7 %
Neutro Abs: 4.8 10*3/uL (ref 1.7–7.7)
Neutrophils Relative %: 69 %
Platelets: 144 10*3/uL — ABNORMAL LOW (ref 150–400)
RBC: 3.3 MIL/uL — ABNORMAL LOW (ref 3.87–5.11)
RDW: 13.8 % (ref 11.5–15.5)
WBC: 7 10*3/uL (ref 4.0–10.5)
nRBC: 0 % (ref 0.0–0.2)

## 2021-08-01 LAB — BASIC METABOLIC PANEL
Anion gap: 10 (ref 5–15)
BUN: 23 mg/dL (ref 8–23)
CO2: 21 mmol/L — ABNORMAL LOW (ref 22–32)
Calcium: 8.9 mg/dL (ref 8.9–10.3)
Chloride: 108 mmol/L (ref 98–111)
Creatinine, Ser: 1 mg/dL (ref 0.44–1.00)
GFR, Estimated: 53 mL/min — ABNORMAL LOW (ref >60)
Glucose, Bld: 101 mg/dL — ABNORMAL HIGH (ref 70–99)
Potassium: 3.7 mmol/L (ref 3.5–5.1)
Sodium: 139 mmol/L (ref 135–145)

## 2021-08-01 MED ORDER — TRAMADOL HCL 50 MG PO TABS
25.0000 mg | ORAL_TABLET | Freq: Once | ORAL | Status: AC
  Administered 2021-08-01: 25 mg via ORAL
  Filled 2021-08-01: qty 1

## 2021-08-01 MED ORDER — POLYETHYLENE GLYCOL 3350 17 G PO PACK
17.0000 g | PACK | Freq: Every day | ORAL | Status: DC | PRN
Start: 2021-08-01 — End: 2021-08-03

## 2021-08-01 MED ORDER — SENNOSIDES-DOCUSATE SODIUM 8.6-50 MG PO TABS: 2.0000 | ORAL_TABLET | Freq: Every evening | ORAL | Status: DC | PRN

## 2021-08-01 MED ORDER — METHOCARBAMOL 500 MG PO TABS
500.0000 mg | ORAL_TABLET | Freq: Four times a day (QID) | ORAL | Status: DC | PRN
Start: 2021-08-01 — End: 2021-08-03
  Administered 2021-08-01 – 2021-08-03 (×4): 500 mg via ORAL
  Filled 2021-08-01 (×4): qty 1

## 2021-08-01 MED ORDER — ACETAMINOPHEN 10 MG/ML IV SOLN
1000.0000 mg | Freq: Four times a day (QID) | INTRAVENOUS | Status: DC | PRN
  Administered 2021-08-01: 1000 mg via INTRAVENOUS
  Filled 2021-08-01: qty 100

## 2021-08-02 DIAGNOSIS — S12110A Anterior displaced Type II dens fracture, initial encounter for closed fracture: Secondary | ICD-10-CM | POA: Diagnosis not present

## 2021-08-02 DIAGNOSIS — S12031D Nondisplaced posterior arch fracture of first cervical vertebra, subsequent encounter for fracture with routine healing: Secondary | ICD-10-CM | POA: Diagnosis not present

## 2021-08-02 DIAGNOSIS — I5032 Chronic diastolic (congestive) heart failure: Secondary | ICD-10-CM | POA: Diagnosis not present

## 2021-08-02 MED ORDER — KETOROLAC TROMETHAMINE 15 MG/ML IJ SOLN
15.0000 mg | Freq: Every day | INTRAMUSCULAR | Status: DC | PRN
  Administered 2021-08-02: 15 mg via INTRAVENOUS
  Filled 2021-08-02: qty 1

## 2021-08-03 DIAGNOSIS — S12110D Anterior displaced Type II dens fracture, subsequent encounter for fracture with routine healing: Secondary | ICD-10-CM | POA: Diagnosis not present

## 2021-08-03 DIAGNOSIS — R52 Pain, unspecified: Secondary | ICD-10-CM | POA: Diagnosis not present

## 2021-08-03 DIAGNOSIS — R41841 Cognitive communication deficit: Secondary | ICD-10-CM | POA: Diagnosis not present

## 2021-08-03 DIAGNOSIS — R296 Repeated falls: Secondary | ICD-10-CM | POA: Diagnosis not present

## 2021-08-03 DIAGNOSIS — D509 Iron deficiency anemia, unspecified: Secondary | ICD-10-CM | POA: Diagnosis not present

## 2021-08-03 DIAGNOSIS — M542 Cervicalgia: Secondary | ICD-10-CM | POA: Diagnosis not present

## 2021-08-03 DIAGNOSIS — J4530 Mild persistent asthma, uncomplicated: Secondary | ICD-10-CM | POA: Diagnosis not present

## 2021-08-03 DIAGNOSIS — S0003XA Contusion of scalp, initial encounter: Secondary | ICD-10-CM | POA: Diagnosis not present

## 2021-08-03 DIAGNOSIS — S129XXA Fracture of neck, unspecified, initial encounter: Secondary | ICD-10-CM | POA: Diagnosis not present

## 2021-08-03 DIAGNOSIS — S022XXD Fracture of nasal bones, subsequent encounter for fracture with routine healing: Secondary | ICD-10-CM | POA: Diagnosis not present

## 2021-08-03 DIAGNOSIS — I130 Hypertensive heart and chronic kidney disease with heart failure and stage 1 through stage 4 chronic kidney disease, or unspecified chronic kidney disease: Secondary | ICD-10-CM | POA: Diagnosis not present

## 2021-08-03 DIAGNOSIS — M2550 Pain in unspecified joint: Secondary | ICD-10-CM | POA: Diagnosis not present

## 2021-08-03 DIAGNOSIS — R531 Weakness: Secondary | ICD-10-CM | POA: Diagnosis not present

## 2021-08-03 DIAGNOSIS — N1832 Chronic kidney disease, stage 3b: Secondary | ICD-10-CM | POA: Diagnosis not present

## 2021-08-03 DIAGNOSIS — R278 Other lack of coordination: Secondary | ICD-10-CM | POA: Diagnosis not present

## 2021-08-03 DIAGNOSIS — K5901 Slow transit constipation: Secondary | ICD-10-CM | POA: Diagnosis not present

## 2021-08-03 DIAGNOSIS — Z6827 Body mass index (BMI) 27.0-27.9, adult: Secondary | ICD-10-CM | POA: Diagnosis not present

## 2021-08-03 DIAGNOSIS — I10 Essential (primary) hypertension: Secondary | ICD-10-CM | POA: Diagnosis not present

## 2021-08-03 DIAGNOSIS — R2689 Other abnormalities of gait and mobility: Secondary | ICD-10-CM | POA: Diagnosis not present

## 2021-08-03 DIAGNOSIS — S128XXD Fracture of other parts of neck, subsequent encounter: Secondary | ICD-10-CM | POA: Diagnosis not present

## 2021-08-03 DIAGNOSIS — I110 Hypertensive heart disease with heart failure: Secondary | ICD-10-CM | POA: Diagnosis not present

## 2021-08-03 DIAGNOSIS — D649 Anemia, unspecified: Secondary | ICD-10-CM | POA: Diagnosis not present

## 2021-08-03 DIAGNOSIS — F32A Depression, unspecified: Secondary | ICD-10-CM | POA: Diagnosis not present

## 2021-08-03 DIAGNOSIS — R410 Disorientation, unspecified: Secondary | ICD-10-CM | POA: Diagnosis not present

## 2021-08-03 DIAGNOSIS — R4182 Altered mental status, unspecified: Secondary | ICD-10-CM | POA: Diagnosis not present

## 2021-08-03 DIAGNOSIS — F0781 Postconcussional syndrome: Secondary | ICD-10-CM | POA: Diagnosis not present

## 2021-08-03 DIAGNOSIS — S12110A Anterior displaced Type II dens fracture, initial encounter for closed fracture: Secondary | ICD-10-CM | POA: Diagnosis not present

## 2021-08-03 DIAGNOSIS — R519 Headache, unspecified: Secondary | ICD-10-CM | POA: Diagnosis not present

## 2021-08-03 DIAGNOSIS — G4489 Other headache syndrome: Secondary | ICD-10-CM | POA: Diagnosis not present

## 2021-08-03 DIAGNOSIS — M6281 Muscle weakness (generalized): Secondary | ICD-10-CM | POA: Diagnosis not present

## 2021-08-03 DIAGNOSIS — I480 Paroxysmal atrial fibrillation: Secondary | ICD-10-CM | POA: Diagnosis not present

## 2021-08-03 DIAGNOSIS — S12031D Nondisplaced posterior arch fracture of first cervical vertebra, subsequent encounter for fracture with routine healing: Secondary | ICD-10-CM | POA: Diagnosis not present

## 2021-08-03 DIAGNOSIS — Z515 Encounter for palliative care: Secondary | ICD-10-CM | POA: Diagnosis not present

## 2021-08-03 DIAGNOSIS — Z7401 Bed confinement status: Secondary | ICD-10-CM | POA: Diagnosis not present

## 2021-08-03 DIAGNOSIS — R1312 Dysphagia, oropharyngeal phase: Secondary | ICD-10-CM | POA: Diagnosis not present

## 2021-08-03 DIAGNOSIS — D631 Anemia in chronic kidney disease: Secondary | ICD-10-CM | POA: Diagnosis not present

## 2021-08-03 DIAGNOSIS — S022XXA Fracture of nasal bones, initial encounter for closed fracture: Secondary | ICD-10-CM | POA: Diagnosis not present

## 2021-08-03 DIAGNOSIS — I5032 Chronic diastolic (congestive) heart failure: Secondary | ICD-10-CM | POA: Diagnosis not present

## 2021-08-03 DIAGNOSIS — I509 Heart failure, unspecified: Secondary | ICD-10-CM | POA: Diagnosis not present

## 2021-08-03 DIAGNOSIS — Z9181 History of falling: Secondary | ICD-10-CM | POA: Diagnosis not present

## 2021-08-03 MED ORDER — METHOCARBAMOL 500 MG PO TABS: 500.0000 mg | ORAL_TABLET | Freq: Three times a day (TID) | ORAL | Status: DC | PRN

## 2021-08-03 MED ORDER — CYANOCOBALAMIN 1000 MCG PO TABS: 1000.0000 ug | ORAL_TABLET | Freq: Every day | ORAL | Status: DC

## 2021-08-03 MED ORDER — HYDRALAZINE HCL 25 MG PO TABS
25.0000 mg | ORAL_TABLET | Freq: Three times a day (TID) | ORAL | Status: DC
  Administered 2021-08-03: 25 mg via ORAL
  Filled 2021-08-03: qty 1

## 2021-08-03 MED ORDER — HYDRALAZINE HCL 25 MG PO TABS: 25.0000 mg | ORAL_TABLET | Freq: Three times a day (TID) | ORAL | Status: DC

## 2021-08-03 MED ORDER — POLYETHYLENE GLYCOL 3350 17 G PO PACK: 17.0000 g | PACK | Freq: Every day | ORAL | 0 refills | Status: DC | PRN

## 2021-08-03 MED ORDER — TRAMADOL HCL 50 MG PO TABS: 25.0000 mg | ORAL_TABLET | Freq: Two times a day (BID) | ORAL | 0 refills | Status: DC | PRN

## 2021-08-03 NOTE — Progress Notes (Signed)
Physical Therapy Treatment Patient Details Name: [name] MRN: [id number] DOB: 1927-06-17 Today's Date: 08/03/2021   History of Present Illness Pt is a 88 y.o. F who presents after a fall. CT scan of c-spine revealed type 2 odontoid fractures. Also noted mildly displaced right nasal bone fracture. CT head negative for acute abnormality. Significant PMH: atrial fibrillation, symptomatic bradycardia with pacemaker, chronic diastolic CHF, HTN, asthma, CKD stage IIIb.    PT Comments    Pt progressing steadily towards her physical therapy goals; appears to have good pain control. Cervical brace adjusted for optimal fit. Pt ambulating 100 ft x 2 with a walker and close chair follow. Continues to present as a high fall risk based on decreased gait speed, history of falls and decreased safety awareness. Continue to recommend SNF for ongoing Physical Therapy.      Recommendations for follow up therapy are one component of a multi-disciplinary discharge planning process, led by the attending physician.  Recommendations may be updated based on patient status, additional functional criteria and insurance authorization.  Follow Up Recommendations  Skilled nursing-short term rehab (<3 hours/day) Can patient physically be transported by private vehicle: Yes   Assistance Recommended at Discharge Frequent or constant Supervision/Assistance  Patient can return home with the following A little help with walking and/or transfers;A little help with bathing/dressing/bathroom;Assistance with cooking/housework;Direct supervision/assist for medications management;Assist for transportation;Help with stairs or ramp for entrance   Equipment Recommendations  Other (comment) (defer to SNF)    Recommendations for Other Services       Precautions / Restrictions Precautions Precautions: Cervical;Fall Required Braces or Orthoses: Cervical Brace Cervical Brace: Hard collar;At all times Restrictions Weight  Bearing Restrictions: No     Mobility  Bed Mobility               General bed mobility comments: Sitting EOB upon arrival    Transfers Overall transfer level: Needs assistance Equipment used: Rolling walker (2 wheels) Transfers: Sit to/from Stand Sit to Stand: Min guard           General transfer comment: Min guard to initially steady    Ambulation/Gait Ambulation/Gait assistance: Min guard Gait Distance (Feet): 100 Feet (2 bouts of 100 ft) Assistive device: Rolling walker (2 wheels) Gait Pattern/deviations: Step-through pattern, Decreased stride length, Shuffle Gait velocity: decreased     General Gait Details: Cues for increased bilateral foot clearance and upright posture. Pt with tendency for increased RLE external rotation. Chair follow for safety   Stairs             Wheelchair Mobility    Modified Rankin (Stroke Patients Only)       Balance Overall balance assessment: Needs assistance Sitting-balance support: No upper extremity supported, Feet supported Sitting balance-Leahy Scale: Fair     Standing balance support: Bilateral upper extremity supported, During functional activity Standing balance-Leahy Scale: Poor Standing balance comment: reliant on BUE support                            Cognition Arousal/Alertness: Awake/alert Behavior During Therapy: WFL for tasks assessed/performed Overall Cognitive Status: History of cognitive impairments - at baseline                                 General Comments: Pt pleasantly confused; stating she would like to go outside. Follows 1 step commands  Exercises      General Comments        Pertinent Vitals/Pain Pain Assessment Pain Assessment: Faces Faces Pain Scale: No hurt    Home Living                          Prior Function            PT Goals (current goals can now be found in the care plan section) Acute Rehab PT Goals Patient  Stated Goal: go outside Potential to Achieve Goals: Fair Progress towards PT goals: Progressing toward goals    Frequency    Min 3X/week      PT Plan Current plan remains appropriate    Co-evaluation              AM-PAC PT "6 Clicks" Mobility   Outcome Measure  Help needed turning from your back to your side while in a flat bed without using bedrails?: A Little Help needed moving from lying on your back to sitting on the side of a flat bed without using bedrails?: A Little Help needed moving to and from a bed to a chair (including a wheelchair)?: A Little Help needed standing up from a chair using your arms (e.g., wheelchair or bedside chair)?: A Little Help needed to walk in hospital room?: A Little Help needed climbing 3-5 steps with a railing? : A Lot 6 Click Score: 17    End of Session Equipment Utilized During Treatment: Gait belt;Cervical collar Activity Tolerance: Patient tolerated treatment well Patient left: in chair;with call bell/phone within reach;with chair alarm set Nurse Communication: Mobility status PT Visit Diagnosis: History of falling (Z91.81);Muscle weakness (generalized) (M62.81);Pain     Time: 1478-2956 PT Time Calculation (min) (ACUTE ONLY): 22 min  Charges:  $Gait Training: 8-22 mins                     Lillia Pauls, PT, DPT Acute Rehabilitation Services Office [phone]    Norval Morton 08/03/2021, 1:08 PM

## 2021-08-04 DIAGNOSIS — I130 Hypertensive heart and chronic kidney disease with heart failure and stage 1 through stage 4 chronic kidney disease, or unspecified chronic kidney disease: Secondary | ICD-10-CM | POA: Diagnosis not present

## 2021-08-04 DIAGNOSIS — Z9181 History of falling: Secondary | ICD-10-CM | POA: Diagnosis not present

## 2021-08-04 DIAGNOSIS — S022XXD Fracture of nasal bones, subsequent encounter for fracture with routine healing: Secondary | ICD-10-CM | POA: Diagnosis not present

## 2021-08-04 DIAGNOSIS — N1832 Chronic kidney disease, stage 3b: Secondary | ICD-10-CM | POA: Diagnosis not present

## 2021-08-04 DIAGNOSIS — D649 Anemia, unspecified: Secondary | ICD-10-CM | POA: Diagnosis not present

## 2021-08-04 DIAGNOSIS — M6281 Muscle weakness (generalized): Secondary | ICD-10-CM | POA: Diagnosis not present

## 2021-08-04 DIAGNOSIS — I5032 Chronic diastolic (congestive) heart failure: Secondary | ICD-10-CM | POA: Diagnosis not present

## 2021-08-04 DIAGNOSIS — S12110D Anterior displaced Type II dens fracture, subsequent encounter for fracture with routine healing: Secondary | ICD-10-CM | POA: Diagnosis not present

## 2021-08-04 DIAGNOSIS — I480 Paroxysmal atrial fibrillation: Secondary | ICD-10-CM | POA: Diagnosis not present

## 2021-08-05 ENCOUNTER — Emergency Department (HOSPITAL_COMMUNITY)
Admission: EM | Admit: 2021-08-05 | Discharge: 2021-08-05 | Disposition: A | Discharge status: Home / Self Care | Payer: Medicare Other | Attending: Emergency Medicine | Admitting: Emergency Medicine

## 2021-08-05 ENCOUNTER — Emergency Department (HOSPITAL_COMMUNITY): Payer: Medicare Other

## 2021-08-05 ENCOUNTER — Encounter (HOSPITAL_COMMUNITY): Payer: Self-pay

## 2021-08-05 ENCOUNTER — Other Ambulatory Visit: Payer: Self-pay

## 2021-08-05 DIAGNOSIS — D509 Iron deficiency anemia, unspecified: Secondary | ICD-10-CM | POA: Diagnosis not present

## 2021-08-05 DIAGNOSIS — M6281 Muscle weakness (generalized): Secondary | ICD-10-CM | POA: Diagnosis not present

## 2021-08-05 DIAGNOSIS — R519 Headache, unspecified: Secondary | ICD-10-CM | POA: Diagnosis not present

## 2021-08-05 DIAGNOSIS — I110 Hypertensive heart disease with heart failure: Secondary | ICD-10-CM | POA: Diagnosis not present

## 2021-08-05 DIAGNOSIS — F0781 Postconcussional syndrome: Secondary | ICD-10-CM | POA: Diagnosis not present

## 2021-08-05 DIAGNOSIS — I480 Paroxysmal atrial fibrillation: Secondary | ICD-10-CM | POA: Diagnosis not present

## 2021-08-05 DIAGNOSIS — I5032 Chronic diastolic (congestive) heart failure: Secondary | ICD-10-CM | POA: Diagnosis not present

## 2021-08-05 DIAGNOSIS — I509 Heart failure, unspecified: Secondary | ICD-10-CM | POA: Insufficient documentation

## 2021-08-05 DIAGNOSIS — S0003XA Contusion of scalp, initial encounter: Secondary | ICD-10-CM | POA: Diagnosis not present

## 2021-08-05 DIAGNOSIS — I10 Essential (primary) hypertension: Secondary | ICD-10-CM | POA: Diagnosis not present

## 2021-08-05 DIAGNOSIS — N1832 Chronic kidney disease, stage 3b: Secondary | ICD-10-CM | POA: Diagnosis not present

## 2021-08-05 DIAGNOSIS — S022XXA Fracture of nasal bones, initial encounter for closed fracture: Secondary | ICD-10-CM | POA: Diagnosis not present

## 2021-08-05 DIAGNOSIS — S129XXA Fracture of neck, unspecified, initial encounter: Secondary | ICD-10-CM | POA: Diagnosis not present

## 2021-08-05 DIAGNOSIS — R296 Repeated falls: Secondary | ICD-10-CM | POA: Diagnosis not present

## 2021-08-05 LAB — COMPREHENSIVE METABOLIC PANEL
ALT: 14 U/L (ref 0–44)
AST: 18 U/L (ref 15–41)
Albumin: 3.6 g/dL (ref 3.5–5.0)
Alkaline Phosphatase: 126 U/L (ref 38–126)
Anion gap: 9 (ref 5–15)
BUN: 24 mg/dL — ABNORMAL HIGH (ref 8–23)
CO2: 23 mmol/L (ref 22–32)
Calcium: 9.4 mg/dL (ref 8.9–10.3)
Chloride: 108 mmol/L (ref 98–111)
Creatinine, Ser: 1.01 mg/dL — ABNORMAL HIGH (ref 0.44–1.00)
GFR, Estimated: 52 mL/min — ABNORMAL LOW (ref >60)
Glucose, Bld: 112 mg/dL — ABNORMAL HIGH (ref 70–99)
Potassium: 3.9 mmol/L (ref 3.5–5.1)
Sodium: 140 mmol/L (ref 135–145)
Total Bilirubin: 0.3 mg/dL (ref 0.3–1.2)
Total Protein: 5.9 g/dL — ABNORMAL LOW (ref 6.5–8.1)

## 2021-08-05 LAB — CBC WITH DIFFERENTIAL/PLATELET
Abs Immature Granulocytes: 0.06 10*3/uL (ref 0.00–0.07)
Basophils Absolute: 0 10*3/uL (ref 0.0–0.1)
Basophils Relative: 0 %
Eosinophils Absolute: 0.2 10*3/uL (ref 0.0–0.5)
Eosinophils Relative: 2 %
HCT: 35.4 % — ABNORMAL LOW (ref 36.0–46.0)
Hemoglobin: 11.6 g/dL — ABNORMAL LOW (ref 12.0–15.0)
Immature Granulocytes: 1 %
Lymphocytes Relative: 24 %
Lymphs Abs: 1.8 10*3/uL (ref 0.7–4.0)
MCH: 32.5 pg (ref 26.0–34.0)
MCHC: 32.8 g/dL (ref 30.0–36.0)
MCV: 99.2 fL (ref 80.0–100.0)
Monocytes Absolute: 0.6 10*3/uL (ref 0.1–1.0)
Monocytes Relative: 8 %
Neutro Abs: 4.9 10*3/uL (ref 1.7–7.7)
Neutrophils Relative %: 65 %
Platelets: 198 10*3/uL (ref 150–400)
RBC: 3.57 MIL/uL — ABNORMAL LOW (ref 3.87–5.11)
RDW: 13.8 % (ref 11.5–15.5)
WBC: 7.5 10*3/uL (ref 4.0–10.5)
nRBC: 0 % (ref 0.0–0.2)

## 2021-08-05 MED ORDER — ACETAMINOPHEN 325 MG PO TABS
650.0000 mg | ORAL_TABLET | Freq: Four times a day (QID) | ORAL | Status: DC | PRN
  Administered 2021-08-05: 650 mg via ORAL
  Filled 2021-08-05: qty 2

## 2021-08-05 NOTE — ED Notes (Signed)
RN called [facility] for update, Pt is 6th on llist.

## 2021-08-05 NOTE — ED Notes (Signed)
RN called [facility]. Pt is 7th one on list.

## 2021-08-05 NOTE — ED Notes (Signed)
Family at bedside would like to to talk to Dr. Dr. Doren Custard notified.

## 2021-08-05 NOTE — ED Provider Notes (Signed)
[facility] EMERGENCY DEPARTMENT Provider Note   CSN: [id number] Arrival date & time: 08/05/21  1309     History  Chief Complaint  Patient presents with   Hypertension   Headache    [name] is a 88 y.o. female.  The history is provided by the patient, a relative and medical records. No language interpreter was used.  Hypertension This is a chronic problem. The problem occurs constantly. The problem has been resolved. Associated symptoms include headaches. Pertinent negatives include no chest pain, no abdominal pain and no shortness of breath. Nothing aggravates the symptoms. Nothing relieves the symptoms. She has tried nothing for the symptoms. The treatment provided no relief.  Headache Associated symptoms: no abdominal pain, no back pain, no congestion, no cough, no diarrhea, no dizziness, no fatigue, no fever, no nausea, no neck pain, no neck stiffness, no numbness, no vomiting and no weakness        Home Medications Prior to Admission medications   Medication Sig Start Date End Date Taking? Authorizing Provider  acetaminophen (TYLENOL) 500 MG tablet Take 2 tablets (1,000 mg total) by mouth every 8 (eight) hours as needed for mild pain or headache. Patient taking differently: Take 1,000 mg by mouth in the morning and at bedtime. 05/16/17   Hongalgi, Lenis Dickinson, MD  albuterol (2.5 MG/3ML) 0.083% NEBU 3 mL, albuterol (5 MG/ML) 0.5% NEBU 0.5 mL Inhale 5 mg into the lungs as needed (shortness of breath/wheezing).    [provider]  albuterol (PROVENTIL HFA;VENTOLIN HFA) 108 (90 BASE) MCG/ACT inhaler Inhale 1 puff into the lungs every 6 (six) hours as needed for wheezing or shortness of breath. Patient taking differently: Inhale 1-2 puffs into the lungs as needed for wheezing or shortness of breath. 11/13/14   Dorothy Spark, MD  allopurinol (ZYLOPRIM) 100 MG tablet Take 1 tablet (100 mg total) by mouth daily. Patient taking differently: Take 100  mg by mouth at bedtime. 04/01/16   Haney, Yetta Flock A, MD  ASPIRIN LOW DOSE 81 MG tablet Take 81 mg by mouth daily. 07/16/21   [provider]  cholecalciferol (VITAMIN D3) 25 MCG (1000 UNIT) tablet Take 1,000 Units by mouth daily.    [provider]  Coenzyme Q10 (COQ-10) 100 MG CAPS Take 100 mg by mouth daily.    [provider]  empagliflozin (JARDIANCE) 10 MG TABS tablet Take 1 tablet (10 mg total) by mouth daily before breakfast. 04/21/21   Early Osmond, MD  famotidine (PEPCID) 20 MG tablet Take 20 mg by mouth 2 (two) times daily.    [provider]  ferrous sulfate 325 (65 FE) MG tablet Take 325 mg by mouth daily.     [provider]  fluticasone furoate-vilanterol (BREO ELLIPTA) 100-25 MCG/INH AEPB Inhale 1 puff into the lungs daily. 07/31/19   Martyn Ehrich, NP  hydrALAZINE (APRESOLINE) 25 MG tablet Take 1 tablet (25 mg total) by mouth every 8 (eight) hours. 08/03/21   Lorella Nimrod, MD  methocarbamol (ROBAXIN) 500 MG tablet Take 1 tablet (500 mg total) by mouth every 8 (eight) hours as needed for muscle spasms. 08/03/21   Lorella Nimrod, MD  montelukast (SINGULAIR) 10 MG tablet TAKE ONE TABLET BY MOUTH EVERYDAY AT BEDTIME Patient taking differently: Take 10 mg by mouth at bedtime. 11/25/20   Martyn Ehrich, NP  nitroGLYCERIN (NITROSTAT) 0.4 MG SL tablet Place 1 tablet (0.4 mg total) under the tongue every 5 (five) minutes as needed for chest  pain. 04/21/21   Early Osmond, MD  ondansetron (ZOFRAN-ODT) 8 MG disintegrating tablet Take 8 mg by mouth 2 (two) times daily as needed for nausea or vomiting. 03/26/19   [provider]  polyethylene glycol (MIRALAX / GLYCOLAX) 17 g packet Take 17 g by mouth daily as needed for moderate constipation. 08/03/21   Lorella Nimrod, MD  sertraline (ZOLOFT) 50 MG tablet Take 50 mg by mouth daily.  03/15/12   [provider]  traMADol (ULTRAM) 50 MG tablet Take 0.5 tablets (25 mg total) by mouth  every 12 (twelve) hours as needed for moderate pain. 08/03/21   Lorella Nimrod, MD  vitamin B-12 1000 MCG tablet Take 1 tablet (1,000 mcg total) by mouth daily. 08/04/21   Lorella Nimrod, MD      Allergies    Sulfonamide derivatives, Amoxicillin, Diltiazem hcl, Losartan potassium, Potassium-containing compounds, Zetia [ezetimibe], and Penicillins    Review of Systems   Review of Systems  Constitutional:  Negative for chills, fatigue and fever.  HENT:  Negative for congestion.   Eyes:  Negative for visual disturbance.  Respiratory:  Negative for cough, chest tightness and shortness of breath.   Cardiovascular:  Negative for chest pain.  Gastrointestinal:  Negative for abdominal pain, constipation, diarrhea, nausea and vomiting.  Genitourinary:  Negative for dysuria.  Musculoskeletal:  Negative for back pain, neck pain and neck stiffness.  Skin:  Negative for rash and wound.  Neurological:  Positive for headaches. Negative for dizziness, weakness, light-headedness and numbness.  Psychiatric/Behavioral:  Negative for agitation.   All other systems reviewed and are negative.   Physical Exam Updated Vital Signs BP (!) 129/55 (BP Location: Right Arm)   Pulse (!) 59   Temp 97.7 F (36.5 C) (Oral)   Resp 18   Ht '5\' 3"'$  (1.6 m)   Wt 70 kg   SpO2 96%   BMI 27.34 kg/m  Physical Exam Vitals and nursing note reviewed.  Constitutional:      General: She is not in acute distress.    Appearance: She is well-developed. She is not ill-appearing, toxic-appearing or diaphoretic.  HENT:     Head: Contusion present. No laceration.      Comments: Ecchymoses all of her forehead.    Nose: Nose normal. No congestion.     Mouth/Throat:     Mouth: Mucous membranes are moist.     Pharynx: No oropharyngeal exudate or posterior oropharyngeal erythema.  Eyes:     Extraocular Movements: Extraocular movements intact.     Conjunctiva/sclera: Conjunctivae normal.     Pupils: Pupils are equal, round, and  reactive to light.  Cardiovascular:     Rate and Rhythm: Normal rate and regular rhythm.     Heart sounds: No murmur heard. Pulmonary:     Effort: Pulmonary effort is normal. No respiratory distress.     Breath sounds: Normal breath sounds. No wheezing, rhonchi or rales.  Chest:     Chest wall: No tenderness.  Abdominal:     Palpations: Abdomen is soft.     Tenderness: There is no abdominal tenderness. There is no guarding or rebound.  Musculoskeletal:        General: No swelling or tenderness.     Cervical back: Neck supple.     Right lower leg: No edema.     Left lower leg: No edema.  Skin:    General: Skin is warm and dry.     Capillary Refill: Capillary refill takes less than 2 seconds.  Findings: Bruising present.  Neurological:     General: No focal deficit present.     Mental Status: She is alert.  Psychiatric:        Mood and Affect: Mood normal.     ED Results / Procedures / Treatments   Labs (all labs ordered are listed, but only abnormal results are displayed) Labs Reviewed  CBC WITH DIFFERENTIAL/PLATELET - Abnormal; Notable for the following components:      Result Value   RBC 3.57 (*)    Hemoglobin 11.6 (*)    HCT 35.4 (*)    All other components within normal limits  COMPREHENSIVE METABOLIC PANEL - Abnormal; Notable for the following components:   Glucose, Bld 112 (*)    BUN 24 (*)    Creatinine, Ser 1.01 (*)    Total Protein 5.9 (*)    GFR, Estimated 52 (*)    All other components within normal limits    EKG None  Radiology CT HEAD WO CONTRAST (5MM)  Result Date: 08/05/2021 CLINICAL DATA:  Trauma persistent headache EXAM: CT HEAD WITHOUT CONTRAST TECHNIQUE: Contiguous axial images were obtained from the base of the skull through the vertex without intravenous contrast. RADIATION DOSE REDUCTION: This exam was performed according to the departmental dose-optimization program which includes automated exposure control, adjustment of the mA and/or kV  according to patient size and/or use of iterative reconstruction technique. COMPARISON:  CT head 07/28/2018 FINDINGS: Brain: There is no acute intracranial hemorrhage, extra-axial fluid collection, or acute infarct. Parenchymal volume is stable. The ventricles are stable in size. 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 There is no mass lesion.  There is no mass effect or midline shift. Vascular: There is calcification of the bilateral cavernous ICAs. Skull: Normal. Negative for fracture or focal lesion. Sinuses/Orbits: The paranasal sinuses are clear. Bilateral lens implants are in place. The globes and orbits are otherwise unremarkable. Other: The right frontal scalp hematoma has decreased in size. Fractures of the anterior and posterior C1 ring are again seen, grossly similar in alignment compared to the cervical spine CT from 07/27/2021. The type 2 odontoid fracture is not well seen. IMPRESSION: 1. No acute intracranial pathology. 2. Decreased right frontal scalp swelling. 3. Grossly stable alignment of the fractures of the C1 ring compared to the cervical spine CT from 07/27/2021. The type 2 dens fracture is not seen on the current study. Electronically Signed   By: Valetta Mole M.D.   On: 08/05/2021 14:20    Procedures Procedures    Medications Ordered in ED Medications  acetaminophen (TYLENOL) tablet 650 mg (650 mg Oral Given 08/05/21 1553)    ED Course/ Medical Decision Making/ A&P                           Medical Decision Making Amount and/or Complexity of Data Reviewed Labs: ordered. Radiology: ordered.  Risk OTC drugs.    KRYSTA BLOOMFIELD is a 88 y.o. female with a past medical history significant for hypertension, paroxysmal atrial fibrillation not on anticoagulation currently, CHF, GERD, pulmonary hypertension, and recent fall with associated facial fractures, C1 fracture, and C2 fracture currently in immobilization collar who presents with  elevated blood pressures and headache.  According to patient and son, patient was admitted last week for a fall and was found to have facial injuries, neck pain, and was found to have C1 and C2 fractures.  She is in a collar and  was discharged but is continued to have headaches.  She reports that today, her headache was moderate to severe and her blood pressure was elevated and they could not controlled at her rehab facility.  They sent her in for evaluation of the elevated blood pressure and the headaches.  Patient is accompanied by family and they reports she has not had any recent fevers, chills, congestion, chest pain, shortness of breath, nausea, vomiting, constipation, or diarrhea.   Per family, her blood pressure was in the 160s and it would not improve with medications so she was sent in especially in the setting of his headache and recent head injury.  Patient is otherwise denying complaints.  She reports her headache has improved since coming to the emergency department and blood pressure on arrival was in the 517O systolic.  On my exam, lungs clear and chest nontender.  Abdomen nontender.  Patient has significant bruising and a large bump on her forehead and bruising on the face.  Patient otherwise resting comfortably with intact sensation, strength, and pulses in extremities.  Pupils are symmetric and reactive with normal extraocular movements she is in her cervical mobilization collar.  Clinically I suspect that her headache is probably related to posttraumatic and postconcussive syndrome.  We will monitor her blood pressure and check some basic labs to look for critical abnormalities.  We will get a repeat head CT given her recent head injury and neck injury.  Given her lack of new trauma or injuries otherwise, will hold on chest x-ray or CT of the neck however we will get the CT of the head.  If patient continues to feel better and blood pressure remains improved and imaging is reassuring  with labs, anticipate discharge home.  Labs returned overall reassuring and similar to prior.  Patient did have some mild headache and we will give her some Tylenol as that is what she wanted.  CT head did not show acute changes or any new bleeding.  Her blood pressure went down into the 110 range and she is not having evidence of hypertensive urgency.  We feel she is safe for discharge home and family agrees.  Will discharge and have her follow-up with her PCP for further management of blood pressure.  I suspect her headache symptoms are related to the recent head trauma.  Labs reassuring.   Patient and family agree with discharge home to continue outpatient follow-up for blood pressure and likely postconcussive headache.  Patient discharged in good condition after reassuring work-up.         Final Clinical Impression(s) / ED Diagnoses Final diagnoses:  Bad headache  Post concussion syndrome    Rx / DC Orders ED Discharge Orders     None       Clinical Impression: 1. Bad headache   2. Post concussion syndrome     Disposition: Discharge  Condition: Good  I have discussed the results, Dx and Tx plan with the pt(& family if present). He/she/they expressed understanding and agree(s) with the plan. Discharge instructions discussed at great length. Strict return precautions discussed and pt &/or family have verbalized understanding of the instructions. No further questions at time of discharge.    New Prescriptions   No medications on file    Follow Up: [name], MD [address] [place] [address] [place] [address] [id number] [place] [phone]  Saraann Enneking, Gwenyth Allegra, MD 08/05/21 [phone]

## 2021-08-05 NOTE — Discharge Instructions (Signed)
Your history, exam, work-up today did not show evidence of significant electrolyte or lab abnormalities and the CT of your head did not show any new bleeding or new injury from the recent trauma.  I suspect you are having some post concussion syndrome causing your continued headaches in the setting of your facial and head injuries.  Blood pressure improved while you are here in your labs were reassuring as we discussed.  We feel you are safe for discharge home but please follow-up with your primary doctor for further management.  If any symptoms change or worsen acutely, please return to the nearest emergency department.

## 2021-08-05 NOTE — Progress Notes (Signed)
Remote pacemaker transmission.   

## 2021-08-05 NOTE — ED Notes (Signed)
Discharge instructions reviewed with patient and daughter. Patient and daughter verbalized understanding of instructions.  Patient wheeled out of ED  in wheelchair. VSS upon discharge.

## 2021-08-05 NOTE — ED Triage Notes (Signed)
Facility called for pt. C/o of HA and HTN 160/96 is what they tool ems. Pt was seen here a couple weeks ago for fall with cervical fx C=collar in place and hematoma to forehead

## 2021-08-05 NOTE — ED Notes (Signed)
Patient's daughter requested to take patient back to [facility] herself. This Rn verified with daughter that she felt comfortable taking this patient back to [facility]. Daughter stated that she felt comfortable and was willing to take the patient and leave now, instead of going back by [facility]. This nurse called [facility] to notify them of the change in plan, [facility] confirmed that the daughter is allowed to take patient back to her residence there. Discharge instructions were gone over with patient and her daughter and patient was helped into a wheelchair and then into her daughter's car. VS stable at the time of patient's discharge.

## 2021-08-07 DIAGNOSIS — I480 Paroxysmal atrial fibrillation: Secondary | ICD-10-CM | POA: Diagnosis not present

## 2021-08-07 DIAGNOSIS — S022XXD Fracture of nasal bones, subsequent encounter for fracture with routine healing: Secondary | ICD-10-CM | POA: Diagnosis not present

## 2021-08-07 DIAGNOSIS — R296 Repeated falls: Secondary | ICD-10-CM | POA: Diagnosis not present

## 2021-08-07 DIAGNOSIS — I5032 Chronic diastolic (congestive) heart failure: Secondary | ICD-10-CM | POA: Diagnosis not present

## 2021-08-07 DIAGNOSIS — S12110D Anterior displaced Type II dens fracture, subsequent encounter for fracture with routine healing: Secondary | ICD-10-CM | POA: Diagnosis not present

## 2021-08-07 DIAGNOSIS — R519 Headache, unspecified: Secondary | ICD-10-CM | POA: Diagnosis not present

## 2021-08-07 DIAGNOSIS — S129XXA Fracture of neck, unspecified, initial encounter: Secondary | ICD-10-CM | POA: Diagnosis not present

## 2021-08-07 DIAGNOSIS — I130 Hypertensive heart and chronic kidney disease with heart failure and stage 1 through stage 4 chronic kidney disease, or unspecified chronic kidney disease: Secondary | ICD-10-CM | POA: Diagnosis not present

## 2021-08-07 DIAGNOSIS — N1832 Chronic kidney disease, stage 3b: Secondary | ICD-10-CM | POA: Diagnosis not present

## 2021-08-07 DIAGNOSIS — D509 Iron deficiency anemia, unspecified: Secondary | ICD-10-CM | POA: Diagnosis not present

## 2021-08-07 DIAGNOSIS — M6281 Muscle weakness (generalized): Secondary | ICD-10-CM | POA: Diagnosis not present

## 2021-08-07 DIAGNOSIS — I10 Essential (primary) hypertension: Secondary | ICD-10-CM | POA: Diagnosis not present

## 2021-08-07 DIAGNOSIS — Z9181 History of falling: Secondary | ICD-10-CM | POA: Diagnosis not present

## 2021-08-07 DIAGNOSIS — S022XXA Fracture of nasal bones, initial encounter for closed fracture: Secondary | ICD-10-CM | POA: Diagnosis not present

## 2021-08-07 DIAGNOSIS — D649 Anemia, unspecified: Secondary | ICD-10-CM | POA: Diagnosis not present

## 2021-08-10 DIAGNOSIS — R519 Headache, unspecified: Secondary | ICD-10-CM | POA: Diagnosis not present

## 2021-08-10 DIAGNOSIS — S129XXA Fracture of neck, unspecified, initial encounter: Secondary | ICD-10-CM | POA: Diagnosis not present

## 2021-08-10 DIAGNOSIS — M6281 Muscle weakness (generalized): Secondary | ICD-10-CM | POA: Diagnosis not present

## 2021-08-10 DIAGNOSIS — I480 Paroxysmal atrial fibrillation: Secondary | ICD-10-CM | POA: Diagnosis not present

## 2021-08-10 DIAGNOSIS — S022XXA Fracture of nasal bones, initial encounter for closed fracture: Secondary | ICD-10-CM | POA: Diagnosis not present

## 2021-08-10 DIAGNOSIS — N1832 Chronic kidney disease, stage 3b: Secondary | ICD-10-CM | POA: Diagnosis not present

## 2021-08-10 DIAGNOSIS — I10 Essential (primary) hypertension: Secondary | ICD-10-CM | POA: Diagnosis not present

## 2021-08-10 DIAGNOSIS — D509 Iron deficiency anemia, unspecified: Secondary | ICD-10-CM | POA: Diagnosis not present

## 2021-08-10 DIAGNOSIS — R296 Repeated falls: Secondary | ICD-10-CM | POA: Diagnosis not present

## 2021-08-10 DIAGNOSIS — I5032 Chronic diastolic (congestive) heart failure: Secondary | ICD-10-CM | POA: Diagnosis not present

## 2021-08-11 DIAGNOSIS — S128XXD Fracture of other parts of neck, subsequent encounter: Secondary | ICD-10-CM | POA: Diagnosis not present

## 2021-08-11 DIAGNOSIS — D649 Anemia, unspecified: Secondary | ICD-10-CM | POA: Diagnosis not present

## 2021-08-11 DIAGNOSIS — I480 Paroxysmal atrial fibrillation: Secondary | ICD-10-CM | POA: Diagnosis not present

## 2021-08-11 DIAGNOSIS — I130 Hypertensive heart and chronic kidney disease with heart failure and stage 1 through stage 4 chronic kidney disease, or unspecified chronic kidney disease: Secondary | ICD-10-CM | POA: Diagnosis not present

## 2021-08-11 DIAGNOSIS — Z9181 History of falling: Secondary | ICD-10-CM | POA: Diagnosis not present

## 2021-08-11 DIAGNOSIS — R296 Repeated falls: Secondary | ICD-10-CM | POA: Diagnosis not present

## 2021-08-11 DIAGNOSIS — S022XXD Fracture of nasal bones, subsequent encounter for fracture with routine healing: Secondary | ICD-10-CM | POA: Diagnosis not present

## 2021-08-11 DIAGNOSIS — N1832 Chronic kidney disease, stage 3b: Secondary | ICD-10-CM | POA: Diagnosis not present

## 2021-08-11 DIAGNOSIS — I5032 Chronic diastolic (congestive) heart failure: Secondary | ICD-10-CM | POA: Diagnosis not present

## 2021-08-11 DIAGNOSIS — D631 Anemia in chronic kidney disease: Secondary | ICD-10-CM | POA: Diagnosis not present

## 2021-08-11 DIAGNOSIS — S12110D Anterior displaced Type II dens fracture, subsequent encounter for fracture with routine healing: Secondary | ICD-10-CM | POA: Diagnosis not present

## 2021-08-11 DIAGNOSIS — M6281 Muscle weakness (generalized): Secondary | ICD-10-CM | POA: Diagnosis not present

## 2021-08-11 DIAGNOSIS — I10 Essential (primary) hypertension: Secondary | ICD-10-CM | POA: Diagnosis not present

## 2021-08-12 DIAGNOSIS — N1832 Chronic kidney disease, stage 3b: Secondary | ICD-10-CM | POA: Diagnosis not present

## 2021-08-12 DIAGNOSIS — I5032 Chronic diastolic (congestive) heart failure: Secondary | ICD-10-CM | POA: Diagnosis not present

## 2021-08-12 DIAGNOSIS — K5901 Slow transit constipation: Secondary | ICD-10-CM | POA: Diagnosis not present

## 2021-08-12 DIAGNOSIS — M6281 Muscle weakness (generalized): Secondary | ICD-10-CM | POA: Diagnosis not present

## 2021-08-12 DIAGNOSIS — S129XXA Fracture of neck, unspecified, initial encounter: Secondary | ICD-10-CM | POA: Diagnosis not present

## 2021-08-12 DIAGNOSIS — R296 Repeated falls: Secondary | ICD-10-CM | POA: Diagnosis not present

## 2021-08-12 DIAGNOSIS — D509 Iron deficiency anemia, unspecified: Secondary | ICD-10-CM | POA: Diagnosis not present

## 2021-08-12 DIAGNOSIS — I10 Essential (primary) hypertension: Secondary | ICD-10-CM | POA: Diagnosis not present

## 2021-08-12 DIAGNOSIS — S022XXA Fracture of nasal bones, initial encounter for closed fracture: Secondary | ICD-10-CM | POA: Diagnosis not present

## 2021-08-12 DIAGNOSIS — I480 Paroxysmal atrial fibrillation: Secondary | ICD-10-CM | POA: Diagnosis not present

## 2021-08-12 DIAGNOSIS — R519 Headache, unspecified: Secondary | ICD-10-CM | POA: Diagnosis not present

## 2021-08-13 DIAGNOSIS — R296 Repeated falls: Secondary | ICD-10-CM | POA: Diagnosis not present

## 2021-08-13 DIAGNOSIS — S022XXA Fracture of nasal bones, initial encounter for closed fracture: Secondary | ICD-10-CM | POA: Diagnosis not present

## 2021-08-13 DIAGNOSIS — M6281 Muscle weakness (generalized): Secondary | ICD-10-CM | POA: Diagnosis not present

## 2021-08-13 DIAGNOSIS — I480 Paroxysmal atrial fibrillation: Secondary | ICD-10-CM | POA: Diagnosis not present

## 2021-08-13 DIAGNOSIS — D509 Iron deficiency anemia, unspecified: Secondary | ICD-10-CM | POA: Diagnosis not present

## 2021-08-13 DIAGNOSIS — R519 Headache, unspecified: Secondary | ICD-10-CM | POA: Diagnosis not present

## 2021-08-13 DIAGNOSIS — N1832 Chronic kidney disease, stage 3b: Secondary | ICD-10-CM | POA: Diagnosis not present

## 2021-08-13 DIAGNOSIS — K5901 Slow transit constipation: Secondary | ICD-10-CM | POA: Diagnosis not present

## 2021-08-13 DIAGNOSIS — I5032 Chronic diastolic (congestive) heart failure: Secondary | ICD-10-CM | POA: Diagnosis not present

## 2021-08-13 DIAGNOSIS — I10 Essential (primary) hypertension: Secondary | ICD-10-CM | POA: Diagnosis not present

## 2021-08-13 DIAGNOSIS — S129XXA Fracture of neck, unspecified, initial encounter: Secondary | ICD-10-CM | POA: Diagnosis not present

## 2021-08-14 ENCOUNTER — Non-Acute Institutional Stay: Payer: Medicare Other | Admitting: Student

## 2021-08-14 DIAGNOSIS — I130 Hypertensive heart and chronic kidney disease with heart failure and stage 1 through stage 4 chronic kidney disease, or unspecified chronic kidney disease: Secondary | ICD-10-CM | POA: Diagnosis not present

## 2021-08-14 DIAGNOSIS — R531 Weakness: Secondary | ICD-10-CM | POA: Diagnosis not present

## 2021-08-14 DIAGNOSIS — I480 Paroxysmal atrial fibrillation: Secondary | ICD-10-CM | POA: Diagnosis not present

## 2021-08-14 DIAGNOSIS — I5032 Chronic diastolic (congestive) heart failure: Secondary | ICD-10-CM | POA: Diagnosis not present

## 2021-08-14 DIAGNOSIS — R52 Pain, unspecified: Secondary | ICD-10-CM | POA: Diagnosis not present

## 2021-08-14 DIAGNOSIS — S12031D Nondisplaced posterior arch fracture of first cervical vertebra, subsequent encounter for fracture with routine healing: Secondary | ICD-10-CM

## 2021-08-14 DIAGNOSIS — N1832 Chronic kidney disease, stage 3b: Secondary | ICD-10-CM | POA: Diagnosis not present

## 2021-08-14 DIAGNOSIS — D649 Anemia, unspecified: Secondary | ICD-10-CM | POA: Diagnosis not present

## 2021-08-14 DIAGNOSIS — S022XXD Fracture of nasal bones, subsequent encounter for fracture with routine healing: Secondary | ICD-10-CM | POA: Diagnosis not present

## 2021-08-14 DIAGNOSIS — M6281 Muscle weakness (generalized): Secondary | ICD-10-CM | POA: Diagnosis not present

## 2021-08-14 DIAGNOSIS — Z515 Encounter for palliative care: Secondary | ICD-10-CM | POA: Diagnosis not present

## 2021-08-14 DIAGNOSIS — Z9181 History of falling: Secondary | ICD-10-CM | POA: Diagnosis not present

## 2021-08-14 DIAGNOSIS — S12110A Anterior displaced Type II dens fracture, initial encounter for closed fracture: Secondary | ICD-10-CM | POA: Diagnosis not present

## 2021-08-14 DIAGNOSIS — S12110D Anterior displaced Type II dens fracture, subsequent encounter for fracture with routine healing: Secondary | ICD-10-CM | POA: Diagnosis not present

## 2021-08-15 NOTE — Progress Notes (Addendum)
[facility] Pall[facility] Consult Note Telephone: [phone]  Fax: [phone]    Date of encounter: 08/14/2021  PATIENT NAME: [name] [place] [address]   [phone] (home)  DOB: July 31, 1927 MRN: [id number] PRIMARY CARE PROVIDER:    [name], MD,  [place]. [address] [place] [address] [phone]  REFERRING PROVIDER:   [name], MD [address] [place],  [place] [address] [phone]  RESPONSIBLE PARTY:    Contact Information     Name Relation Home Work [name] [employer] Daughter [phone]     [name] [phone]     [name] [phone]  [phone]   [name]   [phone]   [name] [phone]  [phone]        I met face to face with patient in the facility. Palliative Care was asked to follow this patient by consultation request of  Michelle Boehringer, NP to address advance care planning and complex medical decision making. This is a follow up visit.  Orienting palliative RN [name] present.                                    ASSESSMENT AND PLAN / RECOMMENDATIONS:   Advance Care Planning/Goals of Care: Goals include to maximize quality of life and symptom management. Patient/health care surrogate gave his/her permission to discuss. Our advance care planning conversation included a discussion about:    The value and importance of advance care planning  Experiences with loved ones who have been seriously ill or have died  Exploration of personal, cultural or spiritual beliefs that might influence medical decisions  Exploration of goals of care in the event of a sudden injury or illness  MOST form updated to [employer] CODE STATUS: DNR  Education provided on Palliative Medicine. Patient currently at [facility]. Patient states she will be staying at facility. Left message for  responsible party to discuss goals of care and clarify long range plan. Palliative will continue to provide supportive care.   Symptom Management/Plan:  Pain-patient c/o headache today. Staff notified to administer acetaminophen. She also has norco TID x 7 days through tomorrow. Encourage low lights to help with headaches.   C1 vertebral fracture, odontoid fracture-patient to follow up with neurosurgery as scheduled. She is to wear cervical collar at all times.   Generalized weakness-patient currently receiving OT/PT. Patient encouraged not to get up unassisted, keep call light within reach. Monitor for falls/safety.    Follow up Palliative Care Visit: Palliative care will continue to follow for complex medical decision making, advance care planning, and clarification of goals. Return n 4 weeks or prn.  This visit was coded based on medical decision making (MDM).  PPS: 40%  HOSPICE ELIGIBILITY/DIAGNOSIS: TBD  Chief Complaint: Palliative Medicine follow up visit.   HISTORY OF PRESENT ILLNESS:  [name] is a 88 y.o. year old female  with chronic diastolic CHF, paroxysmal atrial fibrillation, pulmonary hypertension, CKD 3, COPD. Hospitalized due fall and sustained C1 fracture, facial fractures.  ED visit on 6/28 d/t "bad headache," hypertension.   Patient currently at [place]. She is currently receiving PT/OT/ST per staff. She is to wear C-collar at all times. She endorses headache today; otherwise she states her pain has been managed. Fair appetite endorsed; on modified diet, thickened liquids secondary to dysphagia. Shortness of breath  with exertion, edema to BLE.   Patient received resting in bed; she arouses to stimulation. She has hematoma to right forehead. 1+ BLE edema present. C-collar in place. Patient and staff contribute to HPI, ROS.  History obtained from review of EMR, discussion with primary team, and interview with family, facility staff/caregiver  and/or [name].  I reviewed available labs, medications, imaging, studies and related documents from the EMR.  Records reviewed and summarized above.    Physical Exam: Weight: 148.6 pounds Pulse 68, resp 16, sats 96% on room air Constitutional: NAD General: frail appearing EYES: anicteric sclera, lids intact, no discharge  ENMT: intact hearing, oral mucous membranes moist CV: S1S2, RRR, no LE edema Pulmonary: LCTA, no increased work of breathing, no cough, room air Abdomen: normo-active BS + 4 quadrants, soft and non tender GU: deferred MSK:  moves all extremities Skin: warm and dry, no rashes, hematoma to right forehead Neuro: generalized weakness, A & O to person, place Psych: non-anxious affect, pleasant Hem/lymph/immuno: no widespread bruising   Thank you for the opportunity to participate in the care of [name].  The palliative care team will continue to follow. Please call our office at [phone] if we can be of additional assistance.   Michelle Slocumb, NP   COVID-19 PATIENT SCREENING TOOL Asked and negative response unless otherwise noted:   Have you had symptoms of covid, tested positive or been in contact with someone with symptoms/positive test in the past 5-10 days? No

## 2021-08-17 ENCOUNTER — Other Ambulatory Visit: Payer: Medicare Other | Admitting: Hospice

## 2021-08-18 DIAGNOSIS — S12110D Anterior displaced Type II dens fracture, subsequent encounter for fracture with routine healing: Secondary | ICD-10-CM | POA: Diagnosis not present

## 2021-08-18 DIAGNOSIS — S022XXD Fracture of nasal bones, subsequent encounter for fracture with routine healing: Secondary | ICD-10-CM | POA: Diagnosis not present

## 2021-08-18 DIAGNOSIS — I130 Hypertensive heart and chronic kidney disease with heart failure and stage 1 through stage 4 chronic kidney disease, or unspecified chronic kidney disease: Secondary | ICD-10-CM | POA: Diagnosis not present

## 2021-08-18 DIAGNOSIS — M6281 Muscle weakness (generalized): Secondary | ICD-10-CM | POA: Diagnosis not present

## 2021-08-18 DIAGNOSIS — I5032 Chronic diastolic (congestive) heart failure: Secondary | ICD-10-CM | POA: Diagnosis not present

## 2021-08-18 DIAGNOSIS — Z9181 History of falling: Secondary | ICD-10-CM | POA: Diagnosis not present

## 2021-08-18 DIAGNOSIS — N1832 Chronic kidney disease, stage 3b: Secondary | ICD-10-CM | POA: Diagnosis not present

## 2021-08-18 DIAGNOSIS — D649 Anemia, unspecified: Secondary | ICD-10-CM | POA: Diagnosis not present

## 2021-08-18 DIAGNOSIS — I480 Paroxysmal atrial fibrillation: Secondary | ICD-10-CM | POA: Diagnosis not present

## 2021-08-20 DIAGNOSIS — S12110D Anterior displaced Type II dens fracture, subsequent encounter for fracture with routine healing: Secondary | ICD-10-CM | POA: Diagnosis not present

## 2021-08-20 DIAGNOSIS — Z6827 Body mass index (BMI) 27.0-27.9, adult: Secondary | ICD-10-CM | POA: Diagnosis not present

## 2021-08-21 DIAGNOSIS — S022XXD Fracture of nasal bones, subsequent encounter for fracture with routine healing: Secondary | ICD-10-CM | POA: Diagnosis not present

## 2021-08-21 DIAGNOSIS — S12110D Anterior displaced Type II dens fracture, subsequent encounter for fracture with routine healing: Secondary | ICD-10-CM | POA: Diagnosis not present

## 2021-08-21 DIAGNOSIS — M6281 Muscle weakness (generalized): Secondary | ICD-10-CM | POA: Diagnosis not present

## 2021-08-21 DIAGNOSIS — Z9181 History of falling: Secondary | ICD-10-CM | POA: Diagnosis not present

## 2021-08-21 DIAGNOSIS — I5032 Chronic diastolic (congestive) heart failure: Secondary | ICD-10-CM | POA: Diagnosis not present

## 2021-08-21 DIAGNOSIS — N1832 Chronic kidney disease, stage 3b: Secondary | ICD-10-CM | POA: Diagnosis not present

## 2021-08-21 DIAGNOSIS — D649 Anemia, unspecified: Secondary | ICD-10-CM | POA: Diagnosis not present

## 2021-08-21 DIAGNOSIS — I130 Hypertensive heart and chronic kidney disease with heart failure and stage 1 through stage 4 chronic kidney disease, or unspecified chronic kidney disease: Secondary | ICD-10-CM | POA: Diagnosis not present

## 2021-08-21 DIAGNOSIS — I480 Paroxysmal atrial fibrillation: Secondary | ICD-10-CM | POA: Diagnosis not present

## 2021-08-24 DIAGNOSIS — M542 Cervicalgia: Secondary | ICD-10-CM | POA: Diagnosis not present

## 2021-08-24 DIAGNOSIS — S129XXA Fracture of neck, unspecified, initial encounter: Secondary | ICD-10-CM | POA: Diagnosis not present

## 2021-08-24 DIAGNOSIS — R519 Headache, unspecified: Secondary | ICD-10-CM | POA: Diagnosis not present

## 2021-08-25 DIAGNOSIS — D649 Anemia, unspecified: Secondary | ICD-10-CM | POA: Diagnosis not present

## 2021-08-25 DIAGNOSIS — I130 Hypertensive heart and chronic kidney disease with heart failure and stage 1 through stage 4 chronic kidney disease, or unspecified chronic kidney disease: Secondary | ICD-10-CM | POA: Diagnosis not present

## 2021-08-25 DIAGNOSIS — N1832 Chronic kidney disease, stage 3b: Secondary | ICD-10-CM | POA: Diagnosis not present

## 2021-08-25 DIAGNOSIS — I5032 Chronic diastolic (congestive) heart failure: Secondary | ICD-10-CM | POA: Diagnosis not present

## 2021-08-25 DIAGNOSIS — S022XXD Fracture of nasal bones, subsequent encounter for fracture with routine healing: Secondary | ICD-10-CM | POA: Diagnosis not present

## 2021-08-25 DIAGNOSIS — M6281 Muscle weakness (generalized): Secondary | ICD-10-CM | POA: Diagnosis not present

## 2021-08-25 DIAGNOSIS — S12110D Anterior displaced Type II dens fracture, subsequent encounter for fracture with routine healing: Secondary | ICD-10-CM | POA: Diagnosis not present

## 2021-08-25 DIAGNOSIS — I480 Paroxysmal atrial fibrillation: Secondary | ICD-10-CM | POA: Diagnosis not present

## 2021-08-25 DIAGNOSIS — Z9181 History of falling: Secondary | ICD-10-CM | POA: Diagnosis not present

## 2021-08-28 DIAGNOSIS — I480 Paroxysmal atrial fibrillation: Secondary | ICD-10-CM | POA: Diagnosis not present

## 2021-08-28 DIAGNOSIS — I130 Hypertensive heart and chronic kidney disease with heart failure and stage 1 through stage 4 chronic kidney disease, or unspecified chronic kidney disease: Secondary | ICD-10-CM | POA: Diagnosis not present

## 2021-08-28 DIAGNOSIS — M6281 Muscle weakness (generalized): Secondary | ICD-10-CM | POA: Diagnosis not present

## 2021-08-28 DIAGNOSIS — I5032 Chronic diastolic (congestive) heart failure: Secondary | ICD-10-CM | POA: Diagnosis not present

## 2021-08-28 DIAGNOSIS — Z9181 History of falling: Secondary | ICD-10-CM | POA: Diagnosis not present

## 2021-08-28 DIAGNOSIS — S022XXD Fracture of nasal bones, subsequent encounter for fracture with routine healing: Secondary | ICD-10-CM | POA: Diagnosis not present

## 2021-08-28 DIAGNOSIS — S12110D Anterior displaced Type II dens fracture, subsequent encounter for fracture with routine healing: Secondary | ICD-10-CM | POA: Diagnosis not present

## 2021-08-28 DIAGNOSIS — D649 Anemia, unspecified: Secondary | ICD-10-CM | POA: Diagnosis not present

## 2021-08-28 DIAGNOSIS — N1832 Chronic kidney disease, stage 3b: Secondary | ICD-10-CM | POA: Diagnosis not present

## 2021-08-31 DIAGNOSIS — F32A Depression, unspecified: Secondary | ICD-10-CM | POA: Diagnosis not present

## 2021-09-01 DIAGNOSIS — N1832 Chronic kidney disease, stage 3b: Secondary | ICD-10-CM | POA: Diagnosis not present

## 2021-09-01 DIAGNOSIS — M6281 Muscle weakness (generalized): Secondary | ICD-10-CM | POA: Diagnosis not present

## 2021-09-01 DIAGNOSIS — S022XXD Fracture of nasal bones, subsequent encounter for fracture with routine healing: Secondary | ICD-10-CM | POA: Diagnosis not present

## 2021-09-01 DIAGNOSIS — D649 Anemia, unspecified: Secondary | ICD-10-CM | POA: Diagnosis not present

## 2021-09-01 DIAGNOSIS — I480 Paroxysmal atrial fibrillation: Secondary | ICD-10-CM | POA: Diagnosis not present

## 2021-09-01 DIAGNOSIS — I5032 Chronic diastolic (congestive) heart failure: Secondary | ICD-10-CM | POA: Diagnosis not present

## 2021-09-01 DIAGNOSIS — I130 Hypertensive heart and chronic kidney disease with heart failure and stage 1 through stage 4 chronic kidney disease, or unspecified chronic kidney disease: Secondary | ICD-10-CM | POA: Diagnosis not present

## 2021-09-01 DIAGNOSIS — S12110D Anterior displaced Type II dens fracture, subsequent encounter for fracture with routine healing: Secondary | ICD-10-CM | POA: Diagnosis not present

## 2021-09-01 DIAGNOSIS — Z9181 History of falling: Secondary | ICD-10-CM | POA: Diagnosis not present

## 2021-09-02 ENCOUNTER — Other Ambulatory Visit: Payer: Self-pay

## 2021-09-02 MED ORDER — EMPAGLIFLOZIN 10 MG PO TABS: 10.0000 mg | ORAL_TABLET | Freq: Every day | ORAL | 8 refills | Status: DC

## 2021-09-02 MED ORDER — NITROGLYCERIN 0.4 MG SL SUBL: 0.4000 mg | SUBLINGUAL_TABLET | SUBLINGUAL | 8 refills | Status: DC | PRN

## 2021-09-02 NOTE — Addendum Note (Signed)
Addended by: Carter Kitten D on: 09/02/2021 09:04 AM   Modules accepted: Orders

## 2021-09-03 DIAGNOSIS — K5901 Slow transit constipation: Secondary | ICD-10-CM | POA: Diagnosis not present

## 2021-09-03 DIAGNOSIS — D509 Iron deficiency anemia, unspecified: Secondary | ICD-10-CM | POA: Diagnosis not present

## 2021-09-03 DIAGNOSIS — S128XXD Fracture of other parts of neck, subsequent encounter: Secondary | ICD-10-CM | POA: Diagnosis not present

## 2021-09-03 DIAGNOSIS — N1832 Chronic kidney disease, stage 3b: Secondary | ICD-10-CM | POA: Diagnosis not present

## 2021-09-03 DIAGNOSIS — I480 Paroxysmal atrial fibrillation: Secondary | ICD-10-CM | POA: Diagnosis not present

## 2021-09-03 DIAGNOSIS — S022XXD Fracture of nasal bones, subsequent encounter for fracture with routine healing: Secondary | ICD-10-CM | POA: Diagnosis not present

## 2021-09-03 DIAGNOSIS — R296 Repeated falls: Secondary | ICD-10-CM | POA: Diagnosis not present

## 2021-09-03 DIAGNOSIS — I5032 Chronic diastolic (congestive) heart failure: Secondary | ICD-10-CM | POA: Diagnosis not present

## 2021-09-03 DIAGNOSIS — R519 Headache, unspecified: Secondary | ICD-10-CM | POA: Diagnosis not present

## 2021-09-03 DIAGNOSIS — I10 Essential (primary) hypertension: Secondary | ICD-10-CM | POA: Diagnosis not present

## 2021-09-03 DIAGNOSIS — M6281 Muscle weakness (generalized): Secondary | ICD-10-CM | POA: Diagnosis not present

## 2021-09-04 DIAGNOSIS — I5032 Chronic diastolic (congestive) heart failure: Secondary | ICD-10-CM | POA: Diagnosis not present

## 2021-09-04 DIAGNOSIS — I130 Hypertensive heart and chronic kidney disease with heart failure and stage 1 through stage 4 chronic kidney disease, or unspecified chronic kidney disease: Secondary | ICD-10-CM | POA: Diagnosis not present

## 2021-09-04 DIAGNOSIS — N1832 Chronic kidney disease, stage 3b: Secondary | ICD-10-CM | POA: Diagnosis not present

## 2021-09-04 DIAGNOSIS — I480 Paroxysmal atrial fibrillation: Secondary | ICD-10-CM | POA: Diagnosis not present

## 2021-09-04 DIAGNOSIS — M6281 Muscle weakness (generalized): Secondary | ICD-10-CM | POA: Diagnosis not present

## 2021-09-04 DIAGNOSIS — S12110D Anterior displaced Type II dens fracture, subsequent encounter for fracture with routine healing: Secondary | ICD-10-CM | POA: Diagnosis not present

## 2021-09-04 DIAGNOSIS — Z9181 History of falling: Secondary | ICD-10-CM | POA: Diagnosis not present

## 2021-09-04 DIAGNOSIS — S022XXD Fracture of nasal bones, subsequent encounter for fracture with routine healing: Secondary | ICD-10-CM | POA: Diagnosis not present

## 2021-09-04 DIAGNOSIS — D649 Anemia, unspecified: Secondary | ICD-10-CM | POA: Diagnosis not present

## 2021-09-07 DIAGNOSIS — R519 Headache, unspecified: Secondary | ICD-10-CM | POA: Diagnosis not present

## 2021-09-07 DIAGNOSIS — M542 Cervicalgia: Secondary | ICD-10-CM | POA: Diagnosis not present

## 2021-09-08 DIAGNOSIS — I5032 Chronic diastolic (congestive) heart failure: Secondary | ICD-10-CM | POA: Diagnosis not present

## 2021-09-08 DIAGNOSIS — M6281 Muscle weakness (generalized): Secondary | ICD-10-CM | POA: Diagnosis not present

## 2021-09-08 DIAGNOSIS — I10 Essential (primary) hypertension: Secondary | ICD-10-CM | POA: Diagnosis not present

## 2021-09-08 DIAGNOSIS — I480 Paroxysmal atrial fibrillation: Secondary | ICD-10-CM | POA: Diagnosis not present

## 2021-09-08 DIAGNOSIS — R296 Repeated falls: Secondary | ICD-10-CM | POA: Diagnosis not present

## 2021-09-08 DIAGNOSIS — J4530 Mild persistent asthma, uncomplicated: Secondary | ICD-10-CM | POA: Diagnosis not present

## 2021-09-08 DIAGNOSIS — Z9181 History of falling: Secondary | ICD-10-CM | POA: Diagnosis not present

## 2021-09-08 DIAGNOSIS — N1832 Chronic kidney disease, stage 3b: Secondary | ICD-10-CM | POA: Diagnosis not present

## 2021-09-08 DIAGNOSIS — S12110D Anterior displaced Type II dens fracture, subsequent encounter for fracture with routine healing: Secondary | ICD-10-CM | POA: Diagnosis not present

## 2021-09-08 DIAGNOSIS — S022XXD Fracture of nasal bones, subsequent encounter for fracture with routine healing: Secondary | ICD-10-CM | POA: Diagnosis not present

## 2021-09-08 DIAGNOSIS — S128XXD Fracture of other parts of neck, subsequent encounter: Secondary | ICD-10-CM | POA: Diagnosis not present

## 2021-09-08 DIAGNOSIS — D649 Anemia, unspecified: Secondary | ICD-10-CM | POA: Diagnosis not present

## 2021-09-08 DIAGNOSIS — I130 Hypertensive heart and chronic kidney disease with heart failure and stage 1 through stage 4 chronic kidney disease, or unspecified chronic kidney disease: Secondary | ICD-10-CM | POA: Diagnosis not present

## 2021-09-11 DIAGNOSIS — D649 Anemia, unspecified: Secondary | ICD-10-CM | POA: Diagnosis not present

## 2021-09-11 DIAGNOSIS — I480 Paroxysmal atrial fibrillation: Secondary | ICD-10-CM | POA: Diagnosis not present

## 2021-09-11 DIAGNOSIS — I130 Hypertensive heart and chronic kidney disease with heart failure and stage 1 through stage 4 chronic kidney disease, or unspecified chronic kidney disease: Secondary | ICD-10-CM | POA: Diagnosis not present

## 2021-09-11 DIAGNOSIS — M6281 Muscle weakness (generalized): Secondary | ICD-10-CM | POA: Diagnosis not present

## 2021-09-11 DIAGNOSIS — S022XXD Fracture of nasal bones, subsequent encounter for fracture with routine healing: Secondary | ICD-10-CM | POA: Diagnosis not present

## 2021-09-11 DIAGNOSIS — S12110D Anterior displaced Type II dens fracture, subsequent encounter for fracture with routine healing: Secondary | ICD-10-CM | POA: Diagnosis not present

## 2021-09-11 DIAGNOSIS — I5032 Chronic diastolic (congestive) heart failure: Secondary | ICD-10-CM | POA: Diagnosis not present

## 2021-09-11 DIAGNOSIS — N1832 Chronic kidney disease, stage 3b: Secondary | ICD-10-CM | POA: Diagnosis not present

## 2021-09-11 DIAGNOSIS — Z9181 History of falling: Secondary | ICD-10-CM | POA: Diagnosis not present

## 2021-09-15 DIAGNOSIS — I5032 Chronic diastolic (congestive) heart failure: Secondary | ICD-10-CM | POA: Diagnosis not present

## 2021-09-15 DIAGNOSIS — I480 Paroxysmal atrial fibrillation: Secondary | ICD-10-CM | POA: Diagnosis not present

## 2021-09-15 DIAGNOSIS — S022XXD Fracture of nasal bones, subsequent encounter for fracture with routine healing: Secondary | ICD-10-CM | POA: Diagnosis not present

## 2021-09-15 DIAGNOSIS — N1832 Chronic kidney disease, stage 3b: Secondary | ICD-10-CM | POA: Diagnosis not present

## 2021-09-15 DIAGNOSIS — I130 Hypertensive heart and chronic kidney disease with heart failure and stage 1 through stage 4 chronic kidney disease, or unspecified chronic kidney disease: Secondary | ICD-10-CM | POA: Diagnosis not present

## 2021-09-15 DIAGNOSIS — S12110D Anterior displaced Type II dens fracture, subsequent encounter for fracture with routine healing: Secondary | ICD-10-CM | POA: Diagnosis not present

## 2021-09-15 DIAGNOSIS — D649 Anemia, unspecified: Secondary | ICD-10-CM | POA: Diagnosis not present

## 2021-09-15 DIAGNOSIS — Z9181 History of falling: Secondary | ICD-10-CM | POA: Diagnosis not present

## 2021-09-15 DIAGNOSIS — M6281 Muscle weakness (generalized): Secondary | ICD-10-CM | POA: Diagnosis not present

## 2021-09-18 DIAGNOSIS — I480 Paroxysmal atrial fibrillation: Secondary | ICD-10-CM | POA: Diagnosis not present

## 2021-09-18 DIAGNOSIS — N1832 Chronic kidney disease, stage 3b: Secondary | ICD-10-CM | POA: Diagnosis not present

## 2021-09-18 DIAGNOSIS — Z9181 History of falling: Secondary | ICD-10-CM | POA: Diagnosis not present

## 2021-09-18 DIAGNOSIS — I5032 Chronic diastolic (congestive) heart failure: Secondary | ICD-10-CM | POA: Diagnosis not present

## 2021-09-18 DIAGNOSIS — S022XXD Fracture of nasal bones, subsequent encounter for fracture with routine healing: Secondary | ICD-10-CM | POA: Diagnosis not present

## 2021-09-18 DIAGNOSIS — S12110D Anterior displaced Type II dens fracture, subsequent encounter for fracture with routine healing: Secondary | ICD-10-CM | POA: Diagnosis not present

## 2021-09-18 DIAGNOSIS — M6281 Muscle weakness (generalized): Secondary | ICD-10-CM | POA: Diagnosis not present

## 2021-09-18 DIAGNOSIS — I130 Hypertensive heart and chronic kidney disease with heart failure and stage 1 through stage 4 chronic kidney disease, or unspecified chronic kidney disease: Secondary | ICD-10-CM | POA: Diagnosis not present

## 2021-09-18 DIAGNOSIS — D649 Anemia, unspecified: Secondary | ICD-10-CM | POA: Diagnosis not present

## 2021-09-22 DIAGNOSIS — I480 Paroxysmal atrial fibrillation: Secondary | ICD-10-CM | POA: Diagnosis not present

## 2021-09-22 DIAGNOSIS — D649 Anemia, unspecified: Secondary | ICD-10-CM | POA: Diagnosis not present

## 2021-09-22 DIAGNOSIS — M6281 Muscle weakness (generalized): Secondary | ICD-10-CM | POA: Diagnosis not present

## 2021-09-22 DIAGNOSIS — S12110D Anterior displaced Type II dens fracture, subsequent encounter for fracture with routine healing: Secondary | ICD-10-CM | POA: Diagnosis not present

## 2021-09-22 DIAGNOSIS — Z9181 History of falling: Secondary | ICD-10-CM | POA: Diagnosis not present

## 2021-09-22 DIAGNOSIS — N1832 Chronic kidney disease, stage 3b: Secondary | ICD-10-CM | POA: Diagnosis not present

## 2021-09-22 DIAGNOSIS — I130 Hypertensive heart and chronic kidney disease with heart failure and stage 1 through stage 4 chronic kidney disease, or unspecified chronic kidney disease: Secondary | ICD-10-CM | POA: Diagnosis not present

## 2021-09-22 DIAGNOSIS — S022XXD Fracture of nasal bones, subsequent encounter for fracture with routine healing: Secondary | ICD-10-CM | POA: Diagnosis not present

## 2021-09-22 DIAGNOSIS — I5032 Chronic diastolic (congestive) heart failure: Secondary | ICD-10-CM | POA: Diagnosis not present

## 2021-09-25 DIAGNOSIS — D649 Anemia, unspecified: Secondary | ICD-10-CM | POA: Diagnosis not present

## 2021-09-25 DIAGNOSIS — I5032 Chronic diastolic (congestive) heart failure: Secondary | ICD-10-CM | POA: Diagnosis not present

## 2021-09-25 DIAGNOSIS — Z9181 History of falling: Secondary | ICD-10-CM | POA: Diagnosis not present

## 2021-09-25 DIAGNOSIS — M6281 Muscle weakness (generalized): Secondary | ICD-10-CM | POA: Diagnosis not present

## 2021-09-25 DIAGNOSIS — I130 Hypertensive heart and chronic kidney disease with heart failure and stage 1 through stage 4 chronic kidney disease, or unspecified chronic kidney disease: Secondary | ICD-10-CM | POA: Diagnosis not present

## 2021-09-25 DIAGNOSIS — S12110D Anterior displaced Type II dens fracture, subsequent encounter for fracture with routine healing: Secondary | ICD-10-CM | POA: Diagnosis not present

## 2021-09-25 DIAGNOSIS — N1832 Chronic kidney disease, stage 3b: Secondary | ICD-10-CM | POA: Diagnosis not present

## 2021-09-25 DIAGNOSIS — I480 Paroxysmal atrial fibrillation: Secondary | ICD-10-CM | POA: Diagnosis not present

## 2021-09-25 DIAGNOSIS — S022XXD Fracture of nasal bones, subsequent encounter for fracture with routine healing: Secondary | ICD-10-CM | POA: Diagnosis not present

## 2021-09-29 DIAGNOSIS — S022XXD Fracture of nasal bones, subsequent encounter for fracture with routine healing: Secondary | ICD-10-CM | POA: Diagnosis not present

## 2021-09-29 DIAGNOSIS — M6281 Muscle weakness (generalized): Secondary | ICD-10-CM | POA: Diagnosis not present

## 2021-09-29 DIAGNOSIS — S12110D Anterior displaced Type II dens fracture, subsequent encounter for fracture with routine healing: Secondary | ICD-10-CM | POA: Diagnosis not present

## 2021-09-29 DIAGNOSIS — Z9181 History of falling: Secondary | ICD-10-CM | POA: Diagnosis not present

## 2021-09-29 DIAGNOSIS — D649 Anemia, unspecified: Secondary | ICD-10-CM | POA: Diagnosis not present

## 2021-09-29 DIAGNOSIS — I5032 Chronic diastolic (congestive) heart failure: Secondary | ICD-10-CM | POA: Diagnosis not present

## 2021-09-29 DIAGNOSIS — N1832 Chronic kidney disease, stage 3b: Secondary | ICD-10-CM | POA: Diagnosis not present

## 2021-09-29 DIAGNOSIS — I480 Paroxysmal atrial fibrillation: Secondary | ICD-10-CM | POA: Diagnosis not present

## 2021-09-29 DIAGNOSIS — I130 Hypertensive heart and chronic kidney disease with heart failure and stage 1 through stage 4 chronic kidney disease, or unspecified chronic kidney disease: Secondary | ICD-10-CM | POA: Diagnosis not present

## 2021-10-01 DIAGNOSIS — S12110D Anterior displaced Type II dens fracture, subsequent encounter for fracture with routine healing: Secondary | ICD-10-CM | POA: Diagnosis not present

## 2021-10-06 DIAGNOSIS — I130 Hypertensive heart and chronic kidney disease with heart failure and stage 1 through stage 4 chronic kidney disease, or unspecified chronic kidney disease: Secondary | ICD-10-CM | POA: Diagnosis not present

## 2021-10-06 DIAGNOSIS — I5032 Chronic diastolic (congestive) heart failure: Secondary | ICD-10-CM | POA: Diagnosis not present

## 2021-10-06 DIAGNOSIS — Z9181 History of falling: Secondary | ICD-10-CM | POA: Diagnosis not present

## 2021-10-06 DIAGNOSIS — M6281 Muscle weakness (generalized): Secondary | ICD-10-CM | POA: Diagnosis not present

## 2021-10-06 DIAGNOSIS — N1832 Chronic kidney disease, stage 3b: Secondary | ICD-10-CM | POA: Diagnosis not present

## 2021-10-06 DIAGNOSIS — I480 Paroxysmal atrial fibrillation: Secondary | ICD-10-CM | POA: Diagnosis not present

## 2021-10-06 DIAGNOSIS — S022XXD Fracture of nasal bones, subsequent encounter for fracture with routine healing: Secondary | ICD-10-CM | POA: Diagnosis not present

## 2021-10-06 DIAGNOSIS — D649 Anemia, unspecified: Secondary | ICD-10-CM | POA: Diagnosis not present

## 2021-10-06 DIAGNOSIS — S12110D Anterior displaced Type II dens fracture, subsequent encounter for fracture with routine healing: Secondary | ICD-10-CM | POA: Diagnosis not present

## 2021-10-09 ENCOUNTER — Non-Acute Institutional Stay: Payer: Medicare Other | Admitting: Student

## 2021-10-09 DIAGNOSIS — D649 Anemia, unspecified: Secondary | ICD-10-CM | POA: Diagnosis not present

## 2021-10-09 DIAGNOSIS — I130 Hypertensive heart and chronic kidney disease with heart failure and stage 1 through stage 4 chronic kidney disease, or unspecified chronic kidney disease: Secondary | ICD-10-CM | POA: Diagnosis not present

## 2021-10-09 DIAGNOSIS — Z515 Encounter for palliative care: Secondary | ICD-10-CM | POA: Diagnosis not present

## 2021-10-09 DIAGNOSIS — Z9181 History of falling: Secondary | ICD-10-CM | POA: Diagnosis not present

## 2021-10-09 DIAGNOSIS — I5032 Chronic diastolic (congestive) heart failure: Secondary | ICD-10-CM | POA: Diagnosis not present

## 2021-10-09 DIAGNOSIS — R52 Pain, unspecified: Secondary | ICD-10-CM | POA: Diagnosis not present

## 2021-10-09 DIAGNOSIS — S12110D Anterior displaced Type II dens fracture, subsequent encounter for fracture with routine healing: Secondary | ICD-10-CM | POA: Diagnosis not present

## 2021-10-09 DIAGNOSIS — I480 Paroxysmal atrial fibrillation: Secondary | ICD-10-CM | POA: Diagnosis not present

## 2021-10-09 DIAGNOSIS — M6281 Muscle weakness (generalized): Secondary | ICD-10-CM | POA: Diagnosis not present

## 2021-10-09 DIAGNOSIS — S12031D Nondisplaced posterior arch fracture of first cervical vertebra, subsequent encounter for fracture with routine healing: Secondary | ICD-10-CM | POA: Diagnosis not present

## 2021-10-09 DIAGNOSIS — S022XXD Fracture of nasal bones, subsequent encounter for fracture with routine healing: Secondary | ICD-10-CM | POA: Diagnosis not present

## 2021-10-09 DIAGNOSIS — S12110A Anterior displaced Type II dens fracture, initial encounter for closed fracture: Secondary | ICD-10-CM

## 2021-10-09 DIAGNOSIS — R531 Weakness: Secondary | ICD-10-CM | POA: Diagnosis not present

## 2021-10-09 DIAGNOSIS — N1832 Chronic kidney disease, stage 3b: Secondary | ICD-10-CM | POA: Diagnosis not present

## 2021-10-09 NOTE — Progress Notes (Signed)
[facility] Pall[facility] Consult Note Telephone: [phone]  Fax: [phone]    Date of encounter: 10/09/21 2:21 PM PATIENT NAME: [name] [id number]   [phone] (home)  DOB: 03-29-1927 MRN: [id number] PRIMARY CARE PROVIDER:    [name], MD,  [place]. [address] [place] [address] [phone]  REFERRING PROVIDER:   Vilinda Boehringer, NP  RESPONSIBLE PARTY:    Contact Information     Name Relation Home Work Fajardo Daughter [phone]     Worthy Rancher [phone]     [employer] [phone]  [phone]   Ulysees Barns   [phone]   Izola Price [phone]  [phone]        I met face to face with patient in the facility. Palliative Care was asked to follow this patient by consultation request of  Michelle Boehringer, NP to address advance care planning and complex medical decision making. This is a follow up visit.                                   ASSESSMENT AND PLAN / RECOMMENDATIONS:   Advance Care Planning/Goals of Care: Goals include to maximize quality of life and symptom management. Patient/health care surrogate gave his/her permission to discuss.  CODE STATUS: DNR  Education provided on Palliative Medicine. Will continue to provide supportive care, symptom management as needed.    Symptom Management/Plan:  C1 vertebral fracture, odontoid fracture-patient is now wearing soft cervical collar; worn at all times. She is to follow up with neurosurgery as scheduled. She is to wear cervical collar at all times.    Generalized weakness-patient is still receiving therapy per staff. She does require assistance with adl's. She pedals self about nursing units. She is ambulating with walker per staff. Patient encouraged not to get up unassisted, keep call light within reach. Monitor for falls/safety.    Pain-patient c/o headaches; receiving acetaminophen with effective, Fioricet PRN   Follow up Palliative Care Visit: Palliative care will continue to follow for complex medical decision making, advance care planning, and clarification of goals. Return in 8 weeks or prn.   This visit was coded based on medical decision making (MDM).  PPS: 40%  HOSPICE ELIGIBILITY/DIAGNOSIS: TBD  Chief Complaint: Palliative Medicine follow up visit.   HISTORY OF PRESENT ILLNESS:  [name] is a 88 y.o. year old female  with chronic diastolic CHF, paroxysmal atrial fibrillation, pulmonary hypertension, CKD 3, COPD. Hospitalized due fall and sustained C1 fracture, facial fractures.  ED visit on 6/28 d/t "bad headache," hypertension.    Patient resides at [facility]. She reports doing well; staff state patient is doing well. She is out of bed daily to w/c. She pedals self about nursing units. No falls reported. She has been transitioned to soft cervical collar. She denies pain except occasional headache. Endorses occasional shortness of breath with exertion. Patient endorses good appetite.   History obtained from review of EMR, discussion with primary team, and interview with family, facility staff/caregiver and/or [name].  I reviewed available labs, medications, imaging, studies and related documents from the EMR.  Records reviewed and summarized above.   ROS  A 10-Point ROS is negative, except for the pertinent positives/negatives detailed per the HPI.   Physical Exam: Weight: 152 pounds Pulse 60, resp 16 even/unlabored, b/p 136/78, sats 96% on room  air Constitutional: NAD General: frail appearing  EYES: anicteric sclera, lids intact, no discharge  ENMT: slight hard of  hearing, oral mucous membranes moist CV: S1S2, RRR, 1+ LE edema Pulmonary: LCTA, no increased work of breathing, no cough, room air Abdomen: normo-active BS + 4 quadrants, soft and non tender GU:  deferred MSK: moves all extremities, ambulatory with walker, pedals self in w/c Skin: warm and dry, no rashes or wounds on visible skin Neuro: generalized weakness, A and O x 2, forgetful Psych: non-anxious affect, pleasant Hem/lymph/immuno: no widespread bruising   Thank you for the opportunity to participate in the care of [name].  The palliative care team will continue to follow. Please call our office at [phone] if we can be of additional assistance.   Michelle Slocumb, NP   COVID-19 PATIENT SCREENING TOOL Asked and negative response unless otherwise noted:   Have you had symptoms of covid, tested positive or been in contact with someone with symptoms/positive test in the past 5-10 days? No

## 2021-10-13 DIAGNOSIS — N1832 Chronic kidney disease, stage 3b: Secondary | ICD-10-CM | POA: Diagnosis not present

## 2021-10-13 DIAGNOSIS — I5032 Chronic diastolic (congestive) heart failure: Secondary | ICD-10-CM | POA: Diagnosis not present

## 2021-10-13 DIAGNOSIS — S12110D Anterior displaced Type II dens fracture, subsequent encounter for fracture with routine healing: Secondary | ICD-10-CM | POA: Diagnosis not present

## 2021-10-13 DIAGNOSIS — D649 Anemia, unspecified: Secondary | ICD-10-CM | POA: Diagnosis not present

## 2021-10-13 DIAGNOSIS — I130 Hypertensive heart and chronic kidney disease with heart failure and stage 1 through stage 4 chronic kidney disease, or unspecified chronic kidney disease: Secondary | ICD-10-CM | POA: Diagnosis not present

## 2021-10-13 DIAGNOSIS — I480 Paroxysmal atrial fibrillation: Secondary | ICD-10-CM | POA: Diagnosis not present

## 2021-10-13 DIAGNOSIS — M6281 Muscle weakness (generalized): Secondary | ICD-10-CM | POA: Diagnosis not present

## 2021-10-13 DIAGNOSIS — Z9181 History of falling: Secondary | ICD-10-CM | POA: Diagnosis not present

## 2021-10-13 DIAGNOSIS — S022XXD Fracture of nasal bones, subsequent encounter for fracture with routine healing: Secondary | ICD-10-CM | POA: Diagnosis not present

## 2021-10-14 DIAGNOSIS — S022XXD Fracture of nasal bones, subsequent encounter for fracture with routine healing: Secondary | ICD-10-CM | POA: Diagnosis not present

## 2021-10-14 DIAGNOSIS — J4530 Mild persistent asthma, uncomplicated: Secondary | ICD-10-CM | POA: Diagnosis not present

## 2021-10-14 DIAGNOSIS — N1832 Chronic kidney disease, stage 3b: Secondary | ICD-10-CM | POA: Diagnosis not present

## 2021-10-14 DIAGNOSIS — I480 Paroxysmal atrial fibrillation: Secondary | ICD-10-CM | POA: Diagnosis not present

## 2021-10-14 DIAGNOSIS — I5032 Chronic diastolic (congestive) heart failure: Secondary | ICD-10-CM | POA: Diagnosis not present

## 2021-10-14 DIAGNOSIS — S128XXD Fracture of other parts of neck, subsequent encounter: Secondary | ICD-10-CM | POA: Diagnosis not present

## 2021-10-14 DIAGNOSIS — R296 Repeated falls: Secondary | ICD-10-CM | POA: Diagnosis not present

## 2021-10-14 DIAGNOSIS — I10 Essential (primary) hypertension: Secondary | ICD-10-CM | POA: Diagnosis not present

## 2021-10-14 DIAGNOSIS — M6281 Muscle weakness (generalized): Secondary | ICD-10-CM | POA: Diagnosis not present

## 2021-10-16 DIAGNOSIS — D649 Anemia, unspecified: Secondary | ICD-10-CM | POA: Diagnosis not present

## 2021-10-16 DIAGNOSIS — M6281 Muscle weakness (generalized): Secondary | ICD-10-CM | POA: Diagnosis not present

## 2021-10-16 DIAGNOSIS — S022XXD Fracture of nasal bones, subsequent encounter for fracture with routine healing: Secondary | ICD-10-CM | POA: Diagnosis not present

## 2021-10-16 DIAGNOSIS — S12110D Anterior displaced Type II dens fracture, subsequent encounter for fracture with routine healing: Secondary | ICD-10-CM | POA: Diagnosis not present

## 2021-10-16 DIAGNOSIS — N1832 Chronic kidney disease, stage 3b: Secondary | ICD-10-CM | POA: Diagnosis not present

## 2021-10-16 DIAGNOSIS — I480 Paroxysmal atrial fibrillation: Secondary | ICD-10-CM | POA: Diagnosis not present

## 2021-10-16 DIAGNOSIS — I130 Hypertensive heart and chronic kidney disease with heart failure and stage 1 through stage 4 chronic kidney disease, or unspecified chronic kidney disease: Secondary | ICD-10-CM | POA: Diagnosis not present

## 2021-10-16 DIAGNOSIS — Z9181 History of falling: Secondary | ICD-10-CM | POA: Diagnosis not present

## 2021-10-16 DIAGNOSIS — I5032 Chronic diastolic (congestive) heart failure: Secondary | ICD-10-CM | POA: Diagnosis not present

## 2021-10-20 DIAGNOSIS — I130 Hypertensive heart and chronic kidney disease with heart failure and stage 1 through stage 4 chronic kidney disease, or unspecified chronic kidney disease: Secondary | ICD-10-CM | POA: Diagnosis not present

## 2021-10-20 DIAGNOSIS — M6281 Muscle weakness (generalized): Secondary | ICD-10-CM | POA: Diagnosis not present

## 2021-10-20 DIAGNOSIS — I5032 Chronic diastolic (congestive) heart failure: Secondary | ICD-10-CM | POA: Diagnosis not present

## 2021-10-20 DIAGNOSIS — I480 Paroxysmal atrial fibrillation: Secondary | ICD-10-CM | POA: Diagnosis not present

## 2021-10-20 DIAGNOSIS — S022XXD Fracture of nasal bones, subsequent encounter for fracture with routine healing: Secondary | ICD-10-CM | POA: Diagnosis not present

## 2021-10-20 DIAGNOSIS — N1832 Chronic kidney disease, stage 3b: Secondary | ICD-10-CM | POA: Diagnosis not present

## 2021-10-20 DIAGNOSIS — S12110D Anterior displaced Type II dens fracture, subsequent encounter for fracture with routine healing: Secondary | ICD-10-CM | POA: Diagnosis not present

## 2021-10-20 DIAGNOSIS — Z9181 History of falling: Secondary | ICD-10-CM | POA: Diagnosis not present

## 2021-10-20 DIAGNOSIS — D649 Anemia, unspecified: Secondary | ICD-10-CM | POA: Diagnosis not present

## 2021-10-21 DIAGNOSIS — R519 Headache, unspecified: Secondary | ICD-10-CM | POA: Diagnosis not present

## 2021-10-21 DIAGNOSIS — S128XXD Fracture of other parts of neck, subsequent encounter: Secondary | ICD-10-CM | POA: Diagnosis not present

## 2021-10-23 DIAGNOSIS — S022XXD Fracture of nasal bones, subsequent encounter for fracture with routine healing: Secondary | ICD-10-CM | POA: Diagnosis not present

## 2021-10-23 DIAGNOSIS — I130 Hypertensive heart and chronic kidney disease with heart failure and stage 1 through stage 4 chronic kidney disease, or unspecified chronic kidney disease: Secondary | ICD-10-CM | POA: Diagnosis not present

## 2021-10-23 DIAGNOSIS — N1832 Chronic kidney disease, stage 3b: Secondary | ICD-10-CM | POA: Diagnosis not present

## 2021-10-23 DIAGNOSIS — I5032 Chronic diastolic (congestive) heart failure: Secondary | ICD-10-CM | POA: Diagnosis not present

## 2021-10-23 DIAGNOSIS — M6281 Muscle weakness (generalized): Secondary | ICD-10-CM | POA: Diagnosis not present

## 2021-10-23 DIAGNOSIS — Z9181 History of falling: Secondary | ICD-10-CM | POA: Diagnosis not present

## 2021-10-23 DIAGNOSIS — D649 Anemia, unspecified: Secondary | ICD-10-CM | POA: Diagnosis not present

## 2021-10-23 DIAGNOSIS — S12110D Anterior displaced Type II dens fracture, subsequent encounter for fracture with routine healing: Secondary | ICD-10-CM | POA: Diagnosis not present

## 2021-10-23 DIAGNOSIS — I480 Paroxysmal atrial fibrillation: Secondary | ICD-10-CM | POA: Diagnosis not present

## 2021-10-27 DIAGNOSIS — I5032 Chronic diastolic (congestive) heart failure: Secondary | ICD-10-CM | POA: Diagnosis not present

## 2021-10-27 DIAGNOSIS — I480 Paroxysmal atrial fibrillation: Secondary | ICD-10-CM | POA: Diagnosis not present

## 2021-10-27 DIAGNOSIS — M6281 Muscle weakness (generalized): Secondary | ICD-10-CM | POA: Diagnosis not present

## 2021-10-27 DIAGNOSIS — N1832 Chronic kidney disease, stage 3b: Secondary | ICD-10-CM | POA: Diagnosis not present

## 2021-10-27 DIAGNOSIS — S128XXD Fracture of other parts of neck, subsequent encounter: Secondary | ICD-10-CM | POA: Diagnosis not present

## 2021-10-27 DIAGNOSIS — R296 Repeated falls: Secondary | ICD-10-CM | POA: Diagnosis not present

## 2021-10-27 DIAGNOSIS — D649 Anemia, unspecified: Secondary | ICD-10-CM | POA: Diagnosis not present

## 2021-10-27 DIAGNOSIS — I130 Hypertensive heart and chronic kidney disease with heart failure and stage 1 through stage 4 chronic kidney disease, or unspecified chronic kidney disease: Secondary | ICD-10-CM | POA: Diagnosis not present

## 2021-10-27 DIAGNOSIS — I10 Essential (primary) hypertension: Secondary | ICD-10-CM | POA: Diagnosis not present

## 2021-10-27 DIAGNOSIS — Z9181 History of falling: Secondary | ICD-10-CM | POA: Diagnosis not present

## 2021-10-27 DIAGNOSIS — S022XXD Fracture of nasal bones, subsequent encounter for fracture with routine healing: Secondary | ICD-10-CM | POA: Diagnosis not present

## 2021-10-27 DIAGNOSIS — S12110D Anterior displaced Type II dens fracture, subsequent encounter for fracture with routine healing: Secondary | ICD-10-CM | POA: Diagnosis not present

## 2021-11-02 DIAGNOSIS — J4530 Mild persistent asthma, uncomplicated: Secondary | ICD-10-CM | POA: Diagnosis not present

## 2021-11-02 DIAGNOSIS — I5032 Chronic diastolic (congestive) heart failure: Secondary | ICD-10-CM | POA: Diagnosis not present

## 2021-11-02 DIAGNOSIS — M6281 Muscle weakness (generalized): Secondary | ICD-10-CM | POA: Diagnosis not present

## 2021-11-02 DIAGNOSIS — S022XXD Fracture of nasal bones, subsequent encounter for fracture with routine healing: Secondary | ICD-10-CM | POA: Diagnosis not present

## 2021-11-02 DIAGNOSIS — N1832 Chronic kidney disease, stage 3b: Secondary | ICD-10-CM | POA: Diagnosis not present

## 2021-11-02 DIAGNOSIS — S128XXD Fracture of other parts of neck, subsequent encounter: Secondary | ICD-10-CM | POA: Diagnosis not present

## 2021-11-02 DIAGNOSIS — R296 Repeated falls: Secondary | ICD-10-CM | POA: Diagnosis not present

## 2021-11-02 DIAGNOSIS — I10 Essential (primary) hypertension: Secondary | ICD-10-CM | POA: Diagnosis not present

## 2021-11-02 DIAGNOSIS — I480 Paroxysmal atrial fibrillation: Secondary | ICD-10-CM | POA: Diagnosis not present

## 2021-11-03 DIAGNOSIS — M6281 Muscle weakness (generalized): Secondary | ICD-10-CM | POA: Diagnosis not present

## 2021-11-03 DIAGNOSIS — D649 Anemia, unspecified: Secondary | ICD-10-CM | POA: Diagnosis not present

## 2021-11-03 DIAGNOSIS — I130 Hypertensive heart and chronic kidney disease with heart failure and stage 1 through stage 4 chronic kidney disease, or unspecified chronic kidney disease: Secondary | ICD-10-CM | POA: Diagnosis not present

## 2021-11-03 DIAGNOSIS — S12110D Anterior displaced Type II dens fracture, subsequent encounter for fracture with routine healing: Secondary | ICD-10-CM | POA: Diagnosis not present

## 2021-11-03 DIAGNOSIS — Z9181 History of falling: Secondary | ICD-10-CM | POA: Diagnosis not present

## 2021-11-03 DIAGNOSIS — N1832 Chronic kidney disease, stage 3b: Secondary | ICD-10-CM | POA: Diagnosis not present

## 2021-11-03 DIAGNOSIS — S022XXD Fracture of nasal bones, subsequent encounter for fracture with routine healing: Secondary | ICD-10-CM | POA: Diagnosis not present

## 2021-11-03 DIAGNOSIS — I5032 Chronic diastolic (congestive) heart failure: Secondary | ICD-10-CM | POA: Diagnosis not present

## 2021-11-03 DIAGNOSIS — I480 Paroxysmal atrial fibrillation: Secondary | ICD-10-CM | POA: Diagnosis not present

## 2021-11-03 NOTE — Progress Notes (Unsigned)
Cardiology Office Note:    Date:  11/05/2021   ID:  [name], DOB 01/06/1928, MRN [id number]  PCP:  [name], MD   [facility] Providers Cardiologist:  [name], MD Referring MD: [name],*   Chief Complaint/Reason for Referral:  Cardiology follow up  ASSESSMENT:    Chronic diastolic CHF (congestive heart failure), NYHA class 2 ([place]) - Plan: EKG 12-Lead  PAF (paroxysmal atrial fibrillation) ([place])  Hypertension, unspecified type  Hyperlipidemia, unspecified hyperlipidemia type  Stage 3a chronic kidney disease ([place])  Coronary artery calcification seen on CAT scan  Aortic atherosclerosis ([place])  Chest pain, unspecified type  PLAN:    In order of problems listed above:  1.  Chronic diastolic heart failure: The patient will be transitioning to going home on hospice from rehabilitation.  I will start Lasix 20 mg on a as needed basis for difficulty breathing or peripheral edema.  We will keep follow-up open-ended.   2.  Paroxysmal atrial fibrillation: [name] was started on aspirin 81 mg after discharge.  I think this is probably fine.  Given Michelle high fall risk I do not think aspirin or anticoagulation should be pursued. 3.  Hypertension: Blood pressures controlled. 4.  Hyperlipidemia: Given advanced age we will treat this with dietary control. 5.  Chronic kidney disease: Monitor  6.  Coronary artery calcification: No aspirin as above; given advanced age, will defer statin. 7.  Aortic atherosclerosis: See discussion number 6 above. 8.  Chest pain: Resolved.     Dispo:  Return if symptoms worsen or fail to improve.     Medication Adjustments/Labs and Tests Ordered: Current medicines are reviewed at length with the patient today.  Concerns regarding medicines are outlined above.   Tests Ordered: Orders Placed This Encounter  Procedures   EKG 12-Lead    Medication Changes: Meds ordered this encounter  Medications   furosemide  (LASIX) 20 MG tablet    Sig: Take 1 tablet (20 mg total) by mouth daily as needed. For swelling or shortness of breath    Dispense:  30 tablet    Refill:  3    History of Present Illness:    FOCUSED PROBLEM LIST:   1.  Heart failure with preserved ejection fraction  2.  Sinus node dysfunction status post permanent pacemaker  3.  Paroxysmal atrial fibrillation on Xarelto 4.  Hypertension  5.  Hyperlipidemia  6.  Chronic kidney disease  7.  COPD  8.  Three vessel coronary calcification and aortic atherosclerosis on CT 2021 9.  Frailty 10. Palliative care; inoperable cervical neck fracture   [name]: The patient is a 88 y.o. female with the indicated medical history here for cardiology follow-up.  [name] was last seen in 2021.  [name] had reported increasing shortness of breath and was diagnosed with COPD exacerbation.  Lasix was intensified.  There was consideration for anticoagulation but [name] was not thought to be an optimal anticoagulation candidate.  [name] presented in December with a fall.  [name] did sustain fractures of Michelle pubic rami.  [name] was [facility] to [facility] on anticoagulation.  [name] is here with [name].  [name] tells me that [name] has had multiple falls within the last year.  [name] did hit Michelle head in August.  [name] also fell recently as detailed above.  [name] occasionally gets chest pain.  [name] tells me [name] has had chest pain twice within the last couple weeks.  [name] tells me that it happened  when [name] exerted herself.  [name] has been dizzy at times.  [name] has had no severe bleeding.  [name] has had no signs or symptoms of stroke.  [name] denies any paroxysmal nocturnal dyspnea, orthopnea.  [name] denies any peripheral edema.  Plan: Start Jardiance 10 mg daily, stop Xarelto given fall risk and start aspirin 325 mg, and start as needed nitroglycerin.  Today:  In the interim the patient did suffer a fall in June.  Head CT was negative for intracranial bleed.  [name] was  discharged and referred to palliative care.  Michelle full dose aspirin was stopped and Michelle baby aspirin was restarted.  [name] denies any significant shortness of breath or chest pain.  There are plans in place for Michelle to transition from rehabilitation to hospice at home.  [name] has an inoperable cervical neck fracture that will likely progress probably suddenly in an unpredictable manner.  In terms of other symptomatology the patient feels well.          Current Medications: Current Meds  Medication Sig   acetaminophen (TYLENOL) 500 MG tablet Take 2 tablets (1,000 mg total) by mouth every 8 (eight) hours as needed for mild pain or headache. (Patient taking differently: Take 1,000 mg by mouth in the morning and at bedtime.)   albuterol (2.5 MG/3ML) 0.083% NEBU 3 mL, albuterol (5 MG/ML) 0.5% NEBU 0.5 mL Inhale 5 mg into the lungs as needed (shortness of breath/wheezing).   albuterol (PROVENTIL HFA;VENTOLIN HFA) 108 (90 BASE) MCG/ACT inhaler Inhale 1 puff into the lungs every 6 (six) hours as needed for wheezing or shortness of breath.   alendronate (FOSAMAX) 70 MG tablet Take 70 mg by mouth once a week.   allopurinol (ZYLOPRIM) 100 MG tablet Take 1 tablet (100 mg total) by mouth daily.   ASPIRIN LOW DOSE 81 MG tablet Take 81 mg by mouth daily.   Butalbital-APAP-Caffeine (FIORICET) 50-300-40 MG CAPS Take by mouth.   cholecalciferol (VITAMIN D3) 25 MCG (1000 UNIT) tablet Take 1,000 Units by mouth daily.   Coenzyme Q10 (COQ-10) 100 MG CAPS Take 100 mg by mouth daily.   Docusate Sodium 100 MG capsule Take 100 mg by mouth 2 (two) times daily.   empagliflozin (JARDIANCE) 10 MG TABS tablet Take 1 tablet (10 mg total) by mouth daily before breakfast.   famotidine (PEPCID) 20 MG tablet Take 20 mg by mouth 2 (two) times daily.   ferrous sulfate 325 (65 FE) MG tablet Take 325 mg by mouth daily.    fluticasone furoate-vilanterol (BREO ELLIPTA) 100-25 MCG/INH AEPB Inhale 1 puff into the lungs daily.   furosemide  (LASIX) 20 MG tablet Take 1 tablet (20 mg total) by mouth daily as needed. For swelling or shortness of breath   hydrALAZINE (APRESOLINE) 25 MG tablet Take 1 tablet (25 mg total) by mouth every 8 (eight) hours.   methocarbamol (ROBAXIN) 500 MG tablet Take 1 tablet (500 mg total) by mouth every 8 (eight) hours as needed for muscle spasms.   montelukast (SINGULAIR) 10 MG tablet TAKE ONE TABLET BY MOUTH EVERYDAY AT BEDTIME   nitroGLYCERIN (NITROSTAT) 0.4 MG SL tablet Place 1 tablet (0.4 mg total) under the tongue every 5 (five) minutes as needed for chest pain.   ondansetron (ZOFRAN-ODT) 8 MG disintegrating tablet Take 8 mg by mouth 2 (two) times daily as needed for nausea or vomiting.   polyethylene glycol (MIRALAX / GLYCOLAX) 17 g packet Take 17 g by mouth daily as needed for moderate constipation.   sertraline (  ZOLOFT) 50 MG tablet Take 50 mg by mouth daily.    traMADol (ULTRAM) 50 MG tablet Take 0.5 tablets (25 mg total) by mouth every 12 (twelve) hours as needed for moderate pain.   vitamin B-12 1000 MCG tablet Take 1 tablet (1,000 mcg total) by mouth daily.     Allergies:    Sulfonamide derivatives, Amoxicillin, Diltiazem hcl, Losartan potassium, Potassium-containing compounds, Zetia [ezetimibe], and Penicillins   Social History:   Social History   Tobacco Use   Smoking status: Former    Packs/day: 1.00    Years: 10.00    Total pack years: 10.00    Types: Cigarettes    Quit date: 02/08/1973    Years since quitting: 48.7   Smokeless tobacco: Never  Vaping Use   Vaping Use: Never used  Substance Use Topics   Alcohol use: No   Drug use: No     Family Hx: Family History  Problem Relation Age of Onset   Heart disease Mother    Hypertension Mother    Fainting Mother    Arrhythmia Mother    Diabetes Father    Stroke Father    Hypertension Father    Cancer Brother    Sudden death Brother    Heart attack Brother    Cancer Sister    Diabetes Sister    Asthma Sister       Review of Systems:   Please see the history of present illness.    All other systems reviewed and are negative.     EKGs/Labs/Other Test Reviewed:    EKG:  EKG 11/22 SR with LAFB; EKG today demonstrates atrial pacing with a left anterior fascicular block  Prior CV studies:  TTE 2021 1. Left ventricular ejection fraction, by estimation, is 65 to 70%. The  left ventricle has normal function. The left ventricle has no regional  wall motion abnormalities. There is moderate left ventricular hypertrophy.  Left ventricular diastolic  parameters are consistent with Grade I diastolic dysfunction (impaired  relaxation).   2. Right ventricular systolic function is normal. The right ventricular  size is normal. There is mildly elevated pulmonary artery systolic  pressure. The estimated right ventricular systolic pressure is 41.9 mmHg.   3. Left atrial size was mild to moderately dilated.   4. The mitral valve is normal in structure. Trivial mitral valve  regurgitation. No evidence of mitral stenosis.   5. The aortic valve is normal in structure. Aortic valve regurgitation is  not visualized. No aortic stenosis is present.   6. The inferior vena cava is dilated in size with >50% respiratory  variability, suggesting right atrial pressure of 8 mmHg.  Imaging studies that I have independently reviewed today:   3/21 CT 1. No CT evidence for acute cardiopulmonary abnormality. 2. Cardiomegaly with diffuse 3 vessel coronary artery calcifications and aortic atherosclerosis. 3. Scattered fine subpleural reticular densities involving both lungs, suspected to reflect a degree of underlying fibrotic lung disease, similar to previous. 4. Dilated main pulmonary artery, suggesting underlying pulmonary hypertension. 5. Subacute healing fractures of the right anterior third through sixth ribs. 6. Probable splenomegaly, partially visualized.  Recent Labs: 11/29/2020: Magnesium 2.0 07/30/2021: TSH  1.124 08/05/2021: ALT 14; BUN 24; Creatinine, Ser 1.01; Hemoglobin 11.6; Platelets 198; Potassium 3.9; Sodium 140   Recent Lipid Panel Lab Results  Component Value Date/Time   CHOL  06/07/2008 04:05 AM    118        ATP III CLASSIFICATION:  <200  mg/dL   Desirable  200-239  mg/dL   Borderline High  >=240    mg/dL   High          TRIG 85 06/07/2008 04:05 AM   HDL 35 (L) 06/07/2008 04:05 AM   LDLCALC  06/07/2008 04:05 AM    66        Total Cholesterol/HDL:CHD Risk Coronary Heart Disease Risk Table                     Men   Women  1/2 Average Risk   3.4   3.3  Average Risk       5.0   4.4  2 X Average Risk   9.6   7.1  3 X Average Risk  23.4   11.0        Use the calculated Patient Ratio above and the CHD Risk Table to determine the patient's CHD Risk.        ATP III CLASSIFICATION (LDL):  <100     mg/dL   Optimal  100-129  mg/dL   Near or Above                    Optimal  130-159  mg/dL   Borderline  160-189  mg/dL   High  >190     mg/dL   Very High    Risk Assessment/Calculations:     CHA2DS2-VASc Score = 6   This indicates a 9.7% annual risk of stroke. The patient's score is based upon: CHF History: 1 HTN History: 1 Diabetes History: 1 Stroke History: 0 Vascular Disease History: 0 Age Score: 2 Gender Score: 1         Physical Exam:    VS:  BP 112/60   Pulse 60   SpO2 97%    Wt Readings from Last 3 Encounters:  08/05/21 154 lb 5.2 oz (70 kg)  08/02/21 154 lb 5.2 oz (70 kg)  04/21/21 160 lb (72.6 kg)    GENERAL:  No apparent distress, Aox3, frail HEENT:  No carotid bruits, +2 carotid impulses, no scleral icterus; in neck brace CAR: RRR no murmurs, gallops, rubs, or thrills RES:  Clear to auscultation bilaterally ABD:  Soft, nontender, nondistended, positive bowel sounds x 4 VASC:  +1 radial pulses, +2 carotid pulses, palpable pedal pulses NEURO:  CN 2-12 grossly intact; motor and sensory grossly intact PSYCH:  No active depression or  anxiety EXT:  No edema, ecchymosis, or cyanosis  Signed, Early Osmond, MD  11/05/2021 12:27 PM    [facility] [place], [place],   [address] Phone: [phone]; Fax: [phone]   Note:  This document was prepared using Dragon voice recognition software and may include unintentional dictation errors.

## 2021-11-05 ENCOUNTER — Ambulatory Visit: Payer: Medicare Other | Attending: Internal Medicine | Admitting: Internal Medicine

## 2021-11-05 ENCOUNTER — Encounter: Payer: Self-pay | Admitting: Internal Medicine

## 2021-11-05 VITALS — BP 112/60 | HR 60

## 2021-11-05 DIAGNOSIS — E785 Hyperlipidemia, unspecified: Secondary | ICD-10-CM | POA: Insufficient documentation

## 2021-11-05 DIAGNOSIS — I5032 Chronic diastolic (congestive) heart failure: Secondary | ICD-10-CM | POA: Diagnosis not present

## 2021-11-05 DIAGNOSIS — R079 Chest pain, unspecified: Secondary | ICD-10-CM | POA: Insufficient documentation

## 2021-11-05 DIAGNOSIS — N1831 Chronic kidney disease, stage 3a: Secondary | ICD-10-CM | POA: Insufficient documentation

## 2021-11-05 DIAGNOSIS — I700 Atherosclerosis of aorta: Secondary | ICD-10-CM | POA: Diagnosis not present

## 2021-11-05 DIAGNOSIS — I480 Paroxysmal atrial fibrillation: Secondary | ICD-10-CM | POA: Insufficient documentation

## 2021-11-05 DIAGNOSIS — I2510 Atherosclerotic heart disease of native coronary artery without angina pectoris: Secondary | ICD-10-CM | POA: Insufficient documentation

## 2021-11-05 DIAGNOSIS — I10 Essential (primary) hypertension: Secondary | ICD-10-CM | POA: Insufficient documentation

## 2021-11-05 MED ORDER — FUROSEMIDE 20 MG PO TABS: 20.0000 mg | ORAL_TABLET | Freq: Every day | ORAL | 3 refills | Status: DC | PRN

## 2021-11-05 NOTE — Patient Instructions (Addendum)
Medication Instructions:  Your physician has recommended you make the following change in your medication:   1.) lasix 20 mg - one tablet daily as needed for shortness of breath/swelling  *If you need a refill on your cardiac medications before your next appointment, please call your pharmacy*   Lab Work: none If you have labs (blood work) drawn today and your tests are completely normal, you will receive your results only by: [place] (if you have MyChart) OR A paper copy in the mail If you have any lab test that is abnormal or we need to change your treatment, we will call you to review the results.   Testing/Procedures: none   Follow-Up: As needed  Other Instructions   Important Information About Sugar

## 2021-11-06 DIAGNOSIS — I5032 Chronic diastolic (congestive) heart failure: Secondary | ICD-10-CM | POA: Diagnosis not present

## 2021-11-06 DIAGNOSIS — S022XXD Fracture of nasal bones, subsequent encounter for fracture with routine healing: Secondary | ICD-10-CM | POA: Diagnosis not present

## 2021-11-06 DIAGNOSIS — M6281 Muscle weakness (generalized): Secondary | ICD-10-CM | POA: Diagnosis not present

## 2021-11-06 DIAGNOSIS — S12110D Anterior displaced Type II dens fracture, subsequent encounter for fracture with routine healing: Secondary | ICD-10-CM | POA: Diagnosis not present

## 2021-11-06 DIAGNOSIS — Z9181 History of falling: Secondary | ICD-10-CM | POA: Diagnosis not present

## 2021-11-06 DIAGNOSIS — I130 Hypertensive heart and chronic kidney disease with heart failure and stage 1 through stage 4 chronic kidney disease, or unspecified chronic kidney disease: Secondary | ICD-10-CM | POA: Diagnosis not present

## 2021-11-06 DIAGNOSIS — N1832 Chronic kidney disease, stage 3b: Secondary | ICD-10-CM | POA: Diagnosis not present

## 2021-11-06 DIAGNOSIS — D649 Anemia, unspecified: Secondary | ICD-10-CM | POA: Diagnosis not present

## 2021-11-06 DIAGNOSIS — I480 Paroxysmal atrial fibrillation: Secondary | ICD-10-CM | POA: Diagnosis not present

## 2021-11-09 ENCOUNTER — Ambulatory Visit (INDEPENDENT_AMBULATORY_CARE_PROVIDER_SITE_OTHER): Payer: Medicare Other

## 2021-11-09 DIAGNOSIS — Z515 Encounter for palliative care: Secondary | ICD-10-CM | POA: Diagnosis not present

## 2021-11-09 DIAGNOSIS — F32A Depression, unspecified: Secondary | ICD-10-CM | POA: Diagnosis not present

## 2021-11-09 DIAGNOSIS — F329 Major depressive disorder, single episode, unspecified: Secondary | ICD-10-CM | POA: Diagnosis not present

## 2021-11-09 DIAGNOSIS — G44319 Acute post-traumatic headache, not intractable: Secondary | ICD-10-CM | POA: Diagnosis not present

## 2021-11-09 DIAGNOSIS — N184 Chronic kidney disease, stage 4 (severe): Secondary | ICD-10-CM | POA: Diagnosis not present

## 2021-11-09 DIAGNOSIS — M542 Cervicalgia: Secondary | ICD-10-CM | POA: Diagnosis not present

## 2021-11-09 DIAGNOSIS — J441 Chronic obstructive pulmonary disease with (acute) exacerbation: Secondary | ICD-10-CM | POA: Diagnosis not present

## 2021-11-09 DIAGNOSIS — G311 Senile degeneration of brain, not elsewhere classified: Secondary | ICD-10-CM | POA: Diagnosis not present

## 2021-11-09 DIAGNOSIS — I10 Essential (primary) hypertension: Secondary | ICD-10-CM | POA: Diagnosis not present

## 2021-11-09 DIAGNOSIS — Z66 Do not resuscitate: Secondary | ICD-10-CM | POA: Diagnosis not present

## 2021-11-09 DIAGNOSIS — Z87820 Personal history of traumatic brain injury: Secondary | ICD-10-CM | POA: Diagnosis not present

## 2021-11-09 DIAGNOSIS — I5032 Chronic diastolic (congestive) heart failure: Secondary | ICD-10-CM

## 2021-11-09 DIAGNOSIS — I509 Heart failure, unspecified: Secondary | ICD-10-CM | POA: Diagnosis not present

## 2021-11-10 LAB — CUP PACEART REMOTE DEVICE CHECK
Battery Remaining Longevity: 60 mo
Battery Remaining Percentage: 95 %
Brady Statistic RA Percent Paced: 70 %
Brady Statistic RV Percent Paced: 0 %
Date Time Interrogation Session: 20231002203900
Implantable Lead Implant Date: 20180524
Implantable Lead Implant Date: 20180524
Implantable Lead Location: 753859
Implantable Lead Location: 753860
Implantable Lead Model: 7740
Implantable Lead Model: 7741
Implantable Lead Serial Number: 693708
Implantable Lead Serial Number: 861246
Implantable Pulse Generator Implant Date: 20180524
Lead Channel Impedance Value: 569 Ohm
Lead Channel Impedance Value: 608 Ohm
Lead Channel Pacing Threshold Amplitude: 0.4 V
Lead Channel Pacing Threshold Amplitude: 1.6 V
Lead Channel Pacing Threshold Pulse Width: 0.4 ms
Lead Channel Pacing Threshold Pulse Width: 0.4 ms
Lead Channel Setting Pacing Amplitude: 2 V
Lead Channel Setting Pacing Amplitude: 2.4 V
Lead Channel Setting Pacing Pulse Width: 0.4 ms
Lead Channel Setting Sensing Sensitivity: 2 mV
Pulse Gen Serial Number: 308044

## 2021-11-11 DIAGNOSIS — I509 Heart failure, unspecified: Secondary | ICD-10-CM | POA: Diagnosis not present

## 2021-11-11 DIAGNOSIS — Z87820 Personal history of traumatic brain injury: Secondary | ICD-10-CM | POA: Diagnosis not present

## 2021-11-11 DIAGNOSIS — M542 Cervicalgia: Secondary | ICD-10-CM | POA: Diagnosis not present

## 2021-11-11 DIAGNOSIS — G44319 Acute post-traumatic headache, not intractable: Secondary | ICD-10-CM | POA: Diagnosis not present

## 2021-11-11 DIAGNOSIS — G311 Senile degeneration of brain, not elsewhere classified: Secondary | ICD-10-CM | POA: Diagnosis not present

## 2021-11-11 DIAGNOSIS — I10 Essential (primary) hypertension: Secondary | ICD-10-CM | POA: Diagnosis not present

## 2021-11-13 DIAGNOSIS — G44319 Acute post-traumatic headache, not intractable: Secondary | ICD-10-CM | POA: Diagnosis not present

## 2021-11-13 DIAGNOSIS — Z87820 Personal history of traumatic brain injury: Secondary | ICD-10-CM | POA: Diagnosis not present

## 2021-11-13 DIAGNOSIS — I10 Essential (primary) hypertension: Secondary | ICD-10-CM | POA: Diagnosis not present

## 2021-11-13 DIAGNOSIS — I509 Heart failure, unspecified: Secondary | ICD-10-CM | POA: Diagnosis not present

## 2021-11-13 DIAGNOSIS — G311 Senile degeneration of brain, not elsewhere classified: Secondary | ICD-10-CM | POA: Diagnosis not present

## 2021-11-13 DIAGNOSIS — M542 Cervicalgia: Secondary | ICD-10-CM | POA: Diagnosis not present

## 2021-11-16 DIAGNOSIS — Z87820 Personal history of traumatic brain injury: Secondary | ICD-10-CM | POA: Diagnosis not present

## 2021-11-16 DIAGNOSIS — I10 Essential (primary) hypertension: Secondary | ICD-10-CM | POA: Diagnosis not present

## 2021-11-16 DIAGNOSIS — I509 Heart failure, unspecified: Secondary | ICD-10-CM | POA: Diagnosis not present

## 2021-11-16 DIAGNOSIS — M542 Cervicalgia: Secondary | ICD-10-CM | POA: Diagnosis not present

## 2021-11-16 DIAGNOSIS — G311 Senile degeneration of brain, not elsewhere classified: Secondary | ICD-10-CM | POA: Diagnosis not present

## 2021-11-16 DIAGNOSIS — G44319 Acute post-traumatic headache, not intractable: Secondary | ICD-10-CM | POA: Diagnosis not present

## 2021-11-17 DIAGNOSIS — G44319 Acute post-traumatic headache, not intractable: Secondary | ICD-10-CM | POA: Diagnosis not present

## 2021-11-17 DIAGNOSIS — I509 Heart failure, unspecified: Secondary | ICD-10-CM | POA: Diagnosis not present

## 2021-11-17 DIAGNOSIS — Z86718 Personal history of other venous thrombosis and embolism: Secondary | ICD-10-CM | POA: Diagnosis not present

## 2021-11-17 DIAGNOSIS — Z87820 Personal history of traumatic brain injury: Secondary | ICD-10-CM | POA: Diagnosis not present

## 2021-11-17 DIAGNOSIS — M79604 Pain in right leg: Secondary | ICD-10-CM | POA: Diagnosis not present

## 2021-11-17 DIAGNOSIS — G311 Senile degeneration of brain, not elsewhere classified: Secondary | ICD-10-CM | POA: Diagnosis not present

## 2021-11-17 DIAGNOSIS — M542 Cervicalgia: Secondary | ICD-10-CM | POA: Diagnosis not present

## 2021-11-17 DIAGNOSIS — I10 Essential (primary) hypertension: Secondary | ICD-10-CM | POA: Diagnosis not present

## 2021-11-18 DIAGNOSIS — I509 Heart failure, unspecified: Secondary | ICD-10-CM | POA: Diagnosis not present

## 2021-11-18 DIAGNOSIS — M542 Cervicalgia: Secondary | ICD-10-CM | POA: Diagnosis not present

## 2021-11-18 DIAGNOSIS — G44319 Acute post-traumatic headache, not intractable: Secondary | ICD-10-CM | POA: Diagnosis not present

## 2021-11-18 DIAGNOSIS — Z87820 Personal history of traumatic brain injury: Secondary | ICD-10-CM | POA: Diagnosis not present

## 2021-11-18 DIAGNOSIS — I10 Essential (primary) hypertension: Secondary | ICD-10-CM | POA: Diagnosis not present

## 2021-11-18 DIAGNOSIS — G311 Senile degeneration of brain, not elsewhere classified: Secondary | ICD-10-CM | POA: Diagnosis not present

## 2021-11-20 ENCOUNTER — Telehealth: Payer: Self-pay | Admitting: Internal Medicine

## 2021-11-20 DIAGNOSIS — I10 Essential (primary) hypertension: Secondary | ICD-10-CM | POA: Diagnosis not present

## 2021-11-20 DIAGNOSIS — G311 Senile degeneration of brain, not elsewhere classified: Secondary | ICD-10-CM | POA: Diagnosis not present

## 2021-11-20 DIAGNOSIS — M542 Cervicalgia: Secondary | ICD-10-CM | POA: Diagnosis not present

## 2021-11-20 DIAGNOSIS — Z87820 Personal history of traumatic brain injury: Secondary | ICD-10-CM | POA: Diagnosis not present

## 2021-11-20 DIAGNOSIS — G44319 Acute post-traumatic headache, not intractable: Secondary | ICD-10-CM | POA: Diagnosis not present

## 2021-11-20 DIAGNOSIS — I509 Heart failure, unspecified: Secondary | ICD-10-CM | POA: Diagnosis not present

## 2021-11-20 IMAGING — DX DG CHEST 2V
2 series · 2 of 2 positions shown · non-contrast
Comparison: 06/28/2019 and prior radiographs

CLINICAL DATA: Cough, shortness of breath and wheezing for 1 month.

EXAM:
CHEST - 2 VIEW

[chest pa]
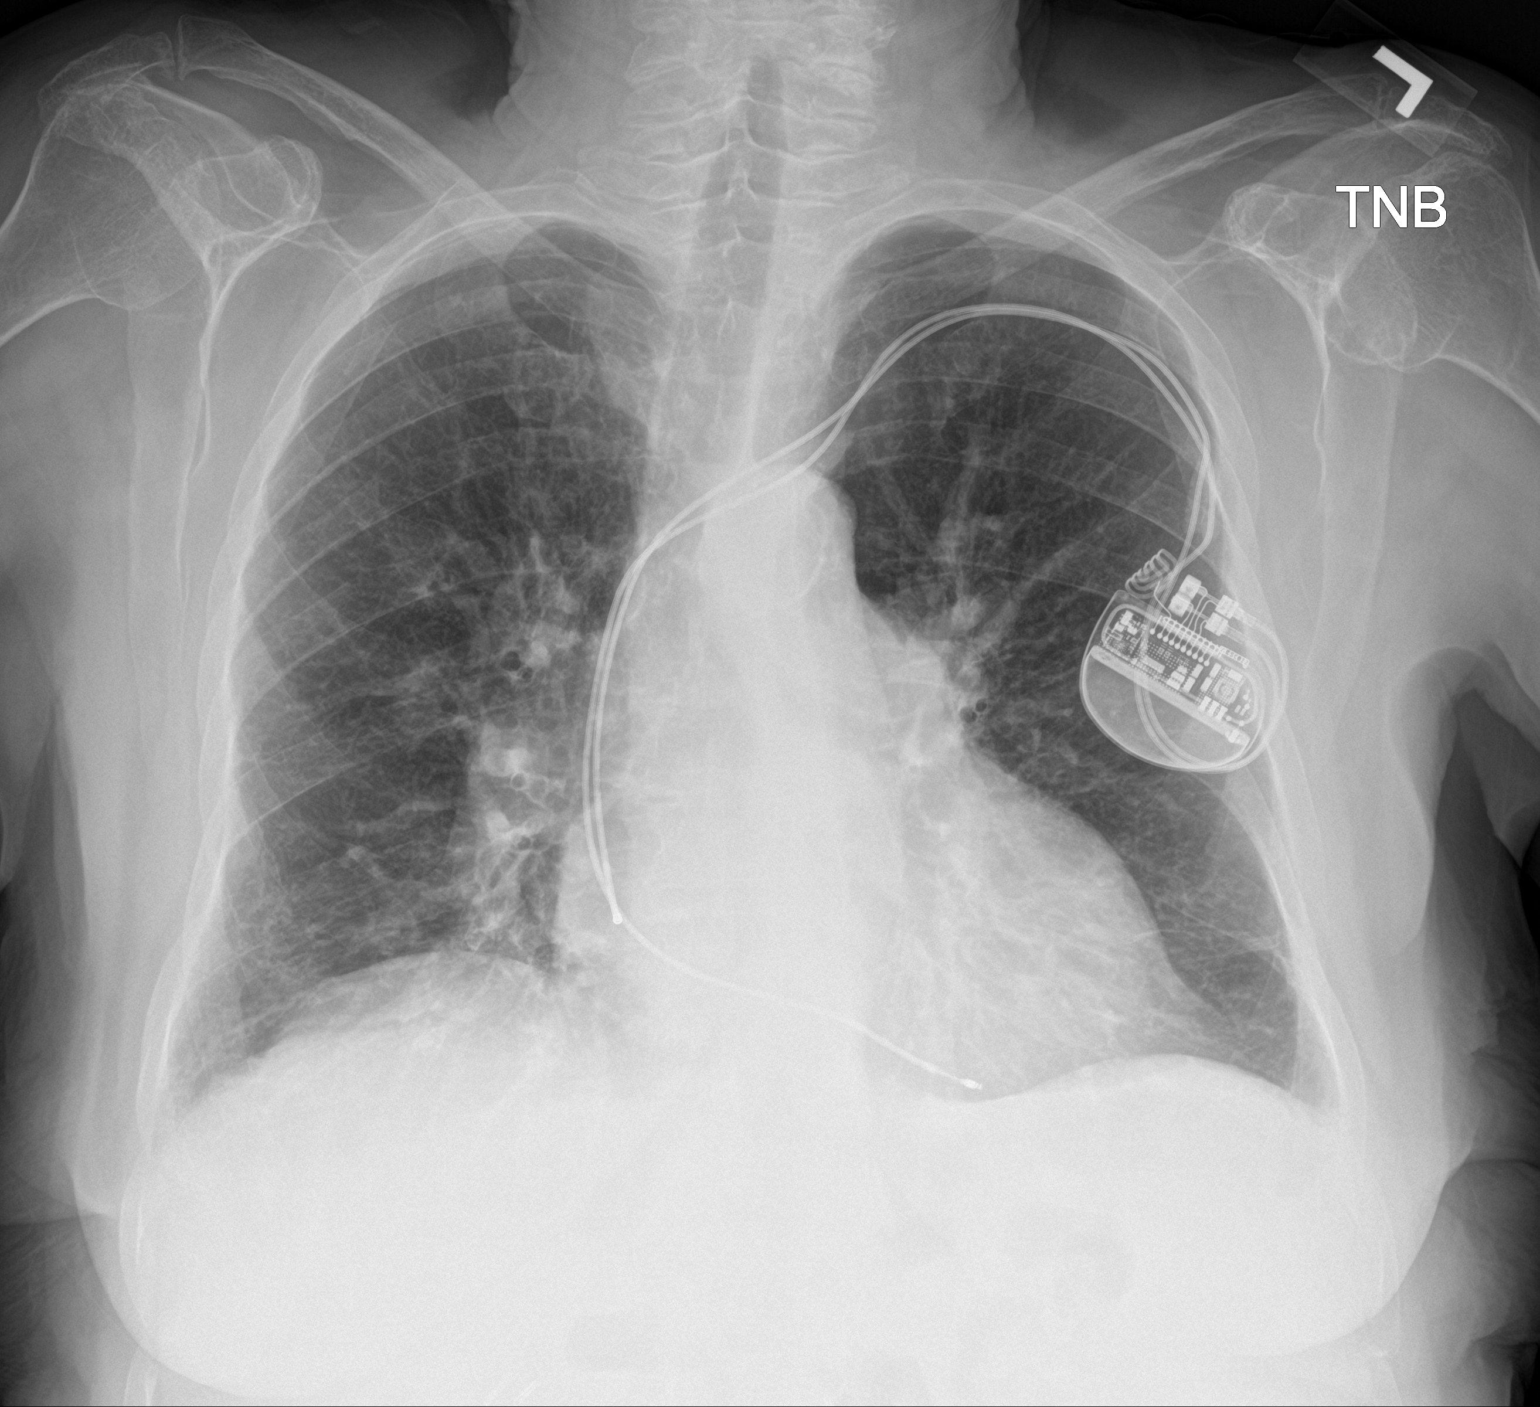

[chest lat]
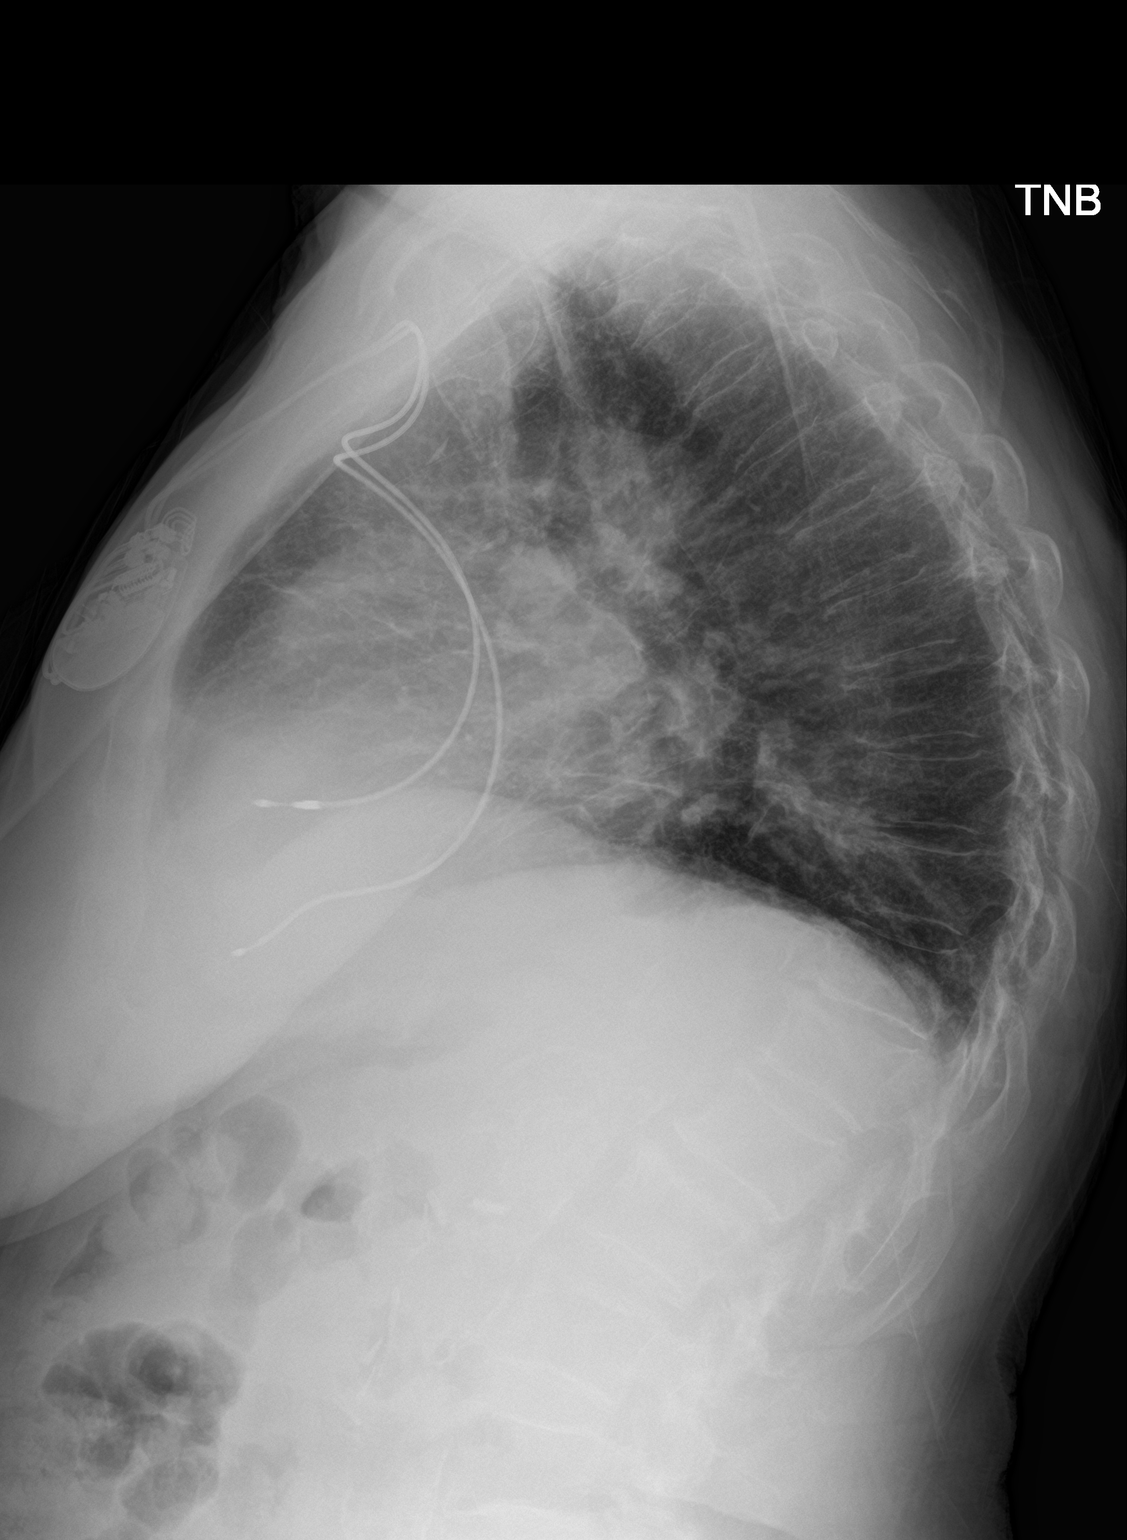

[2 of 2 positions shown; findings below may reference images not displayed]

FINDINGS: Cardiomegaly and LEFT-sided pacemaker are unchanged.

Mild interstitial prominence and mild bibasilar atelectasis again
noted.

There is no evidence of focal airspace disease, pulmonary edema,
suspicious pulmonary nodule/mass, pleural effusion, or pneumothorax.

No acute bony abnormalities are identified.
IMPRESSION: Cardiomegaly with unchanged mild interstitial prominence and
bibasilar atelectasis.

## 2021-11-20 NOTE — Telephone Encounter (Signed)
Pt c/o medication issue:  1. Name of Medication: empagliflozin (JARDIANCE) 10 MG TABS tablet  2. How are you currently taking this medication (dosage and times per day)? Take 1 tablet (10 mg total) by mouth daily before breakfast.  3. Are you having a reaction (difficulty breathing--STAT)? no  4. What is your medication issue? Calling to see if there is another medication the patient can take. The cost of it is expensive. Please advise

## 2021-11-23 DIAGNOSIS — M542 Cervicalgia: Secondary | ICD-10-CM | POA: Diagnosis not present

## 2021-11-23 DIAGNOSIS — Z87820 Personal history of traumatic brain injury: Secondary | ICD-10-CM | POA: Diagnosis not present

## 2021-11-23 DIAGNOSIS — G311 Senile degeneration of brain, not elsewhere classified: Secondary | ICD-10-CM | POA: Diagnosis not present

## 2021-11-23 DIAGNOSIS — G44319 Acute post-traumatic headache, not intractable: Secondary | ICD-10-CM | POA: Diagnosis not present

## 2021-11-23 DIAGNOSIS — I509 Heart failure, unspecified: Secondary | ICD-10-CM | POA: Diagnosis not present

## 2021-11-23 DIAGNOSIS — I10 Essential (primary) hypertension: Secondary | ICD-10-CM | POA: Diagnosis not present

## 2021-11-23 NOTE — Telephone Encounter (Signed)
**Note De-Identified  Obfuscation** The pt came to the phone and gave permission for me to s/w her granddaughter, [name].  [name] states that the pt is new to hospice and that everything is "up in the air" right now concerning her medications and care.  [name] states that since she called [place] concerning the cost of the pts Jardiance, she has s/w hospice who should be paying for the pts heart meds but Vania Rea may be an issue since Jardiance treats DM per Hospice.  [name] and I discussed pt assistance for Jardiance through [facility] or for [place] through [place] and me. She states that she wants to discuss this with her mother who is the pts power of attorney and will call us back.  She thanked me for calling her back.

## 2021-11-24 DIAGNOSIS — I509 Heart failure, unspecified: Secondary | ICD-10-CM | POA: Diagnosis not present

## 2021-11-24 DIAGNOSIS — G311 Senile degeneration of brain, not elsewhere classified: Secondary | ICD-10-CM | POA: Diagnosis not present

## 2021-11-24 DIAGNOSIS — Z87820 Personal history of traumatic brain injury: Secondary | ICD-10-CM | POA: Diagnosis not present

## 2021-11-24 DIAGNOSIS — M542 Cervicalgia: Secondary | ICD-10-CM | POA: Diagnosis not present

## 2021-11-24 DIAGNOSIS — G44319 Acute post-traumatic headache, not intractable: Secondary | ICD-10-CM | POA: Diagnosis not present

## 2021-11-24 DIAGNOSIS — I10 Essential (primary) hypertension: Secondary | ICD-10-CM | POA: Diagnosis not present

## 2021-11-25 DIAGNOSIS — G311 Senile degeneration of brain, not elsewhere classified: Secondary | ICD-10-CM | POA: Diagnosis not present

## 2021-11-25 DIAGNOSIS — I10 Essential (primary) hypertension: Secondary | ICD-10-CM | POA: Diagnosis not present

## 2021-11-25 DIAGNOSIS — I509 Heart failure, unspecified: Secondary | ICD-10-CM | POA: Diagnosis not present

## 2021-11-25 DIAGNOSIS — Z87820 Personal history of traumatic brain injury: Secondary | ICD-10-CM | POA: Diagnosis not present

## 2021-11-25 DIAGNOSIS — G44319 Acute post-traumatic headache, not intractable: Secondary | ICD-10-CM | POA: Diagnosis not present

## 2021-11-25 DIAGNOSIS — M542 Cervicalgia: Secondary | ICD-10-CM | POA: Diagnosis not present

## 2021-11-25 NOTE — Progress Notes (Signed)
Remote pacemaker transmission.   

## 2021-11-27 DIAGNOSIS — M542 Cervicalgia: Secondary | ICD-10-CM | POA: Diagnosis not present

## 2021-11-27 DIAGNOSIS — G311 Senile degeneration of brain, not elsewhere classified: Secondary | ICD-10-CM | POA: Diagnosis not present

## 2021-11-27 DIAGNOSIS — I509 Heart failure, unspecified: Secondary | ICD-10-CM | POA: Diagnosis not present

## 2021-11-27 DIAGNOSIS — Z87820 Personal history of traumatic brain injury: Secondary | ICD-10-CM | POA: Diagnosis not present

## 2021-11-27 DIAGNOSIS — I10 Essential (primary) hypertension: Secondary | ICD-10-CM | POA: Diagnosis not present

## 2021-11-27 DIAGNOSIS — G44319 Acute post-traumatic headache, not intractable: Secondary | ICD-10-CM | POA: Diagnosis not present

## 2021-11-29 DIAGNOSIS — G44319 Acute post-traumatic headache, not intractable: Secondary | ICD-10-CM | POA: Diagnosis not present

## 2021-11-29 DIAGNOSIS — M542 Cervicalgia: Secondary | ICD-10-CM | POA: Diagnosis not present

## 2021-11-29 DIAGNOSIS — I509 Heart failure, unspecified: Secondary | ICD-10-CM | POA: Diagnosis not present

## 2021-11-29 DIAGNOSIS — Z87820 Personal history of traumatic brain injury: Secondary | ICD-10-CM | POA: Diagnosis not present

## 2021-11-29 DIAGNOSIS — G311 Senile degeneration of brain, not elsewhere classified: Secondary | ICD-10-CM | POA: Diagnosis not present

## 2021-11-29 DIAGNOSIS — I10 Essential (primary) hypertension: Secondary | ICD-10-CM | POA: Diagnosis not present

## 2021-11-30 DIAGNOSIS — G311 Senile degeneration of brain, not elsewhere classified: Secondary | ICD-10-CM | POA: Diagnosis not present

## 2021-11-30 DIAGNOSIS — G44319 Acute post-traumatic headache, not intractable: Secondary | ICD-10-CM | POA: Diagnosis not present

## 2021-11-30 DIAGNOSIS — I10 Essential (primary) hypertension: Secondary | ICD-10-CM | POA: Diagnosis not present

## 2021-11-30 DIAGNOSIS — Z87820 Personal history of traumatic brain injury: Secondary | ICD-10-CM | POA: Diagnosis not present

## 2021-11-30 DIAGNOSIS — I509 Heart failure, unspecified: Secondary | ICD-10-CM | POA: Diagnosis not present

## 2021-11-30 DIAGNOSIS — M542 Cervicalgia: Secondary | ICD-10-CM | POA: Diagnosis not present

## 2021-12-02 DIAGNOSIS — M542 Cervicalgia: Secondary | ICD-10-CM | POA: Diagnosis not present

## 2021-12-02 DIAGNOSIS — I509 Heart failure, unspecified: Secondary | ICD-10-CM | POA: Diagnosis not present

## 2021-12-02 DIAGNOSIS — Z87820 Personal history of traumatic brain injury: Secondary | ICD-10-CM | POA: Diagnosis not present

## 2021-12-02 DIAGNOSIS — I10 Essential (primary) hypertension: Secondary | ICD-10-CM | POA: Diagnosis not present

## 2021-12-02 DIAGNOSIS — G44319 Acute post-traumatic headache, not intractable: Secondary | ICD-10-CM | POA: Diagnosis not present

## 2021-12-02 DIAGNOSIS — G311 Senile degeneration of brain, not elsewhere classified: Secondary | ICD-10-CM | POA: Diagnosis not present

## 2021-12-04 DIAGNOSIS — M542 Cervicalgia: Secondary | ICD-10-CM | POA: Diagnosis not present

## 2021-12-04 DIAGNOSIS — Z87820 Personal history of traumatic brain injury: Secondary | ICD-10-CM | POA: Diagnosis not present

## 2021-12-04 DIAGNOSIS — I509 Heart failure, unspecified: Secondary | ICD-10-CM | POA: Diagnosis not present

## 2021-12-04 DIAGNOSIS — G311 Senile degeneration of brain, not elsewhere classified: Secondary | ICD-10-CM | POA: Diagnosis not present

## 2021-12-04 DIAGNOSIS — G44319 Acute post-traumatic headache, not intractable: Secondary | ICD-10-CM | POA: Diagnosis not present

## 2021-12-04 DIAGNOSIS — I10 Essential (primary) hypertension: Secondary | ICD-10-CM | POA: Diagnosis not present

## 2021-12-07 DIAGNOSIS — Z87820 Personal history of traumatic brain injury: Secondary | ICD-10-CM | POA: Diagnosis not present

## 2021-12-07 DIAGNOSIS — I509 Heart failure, unspecified: Secondary | ICD-10-CM | POA: Diagnosis not present

## 2021-12-07 DIAGNOSIS — I10 Essential (primary) hypertension: Secondary | ICD-10-CM | POA: Diagnosis not present

## 2021-12-07 DIAGNOSIS — M542 Cervicalgia: Secondary | ICD-10-CM | POA: Diagnosis not present

## 2021-12-07 DIAGNOSIS — G44319 Acute post-traumatic headache, not intractable: Secondary | ICD-10-CM | POA: Diagnosis not present

## 2021-12-07 DIAGNOSIS — G311 Senile degeneration of brain, not elsewhere classified: Secondary | ICD-10-CM | POA: Diagnosis not present

## 2021-12-08 DIAGNOSIS — I509 Heart failure, unspecified: Secondary | ICD-10-CM | POA: Diagnosis not present

## 2021-12-08 DIAGNOSIS — Z87820 Personal history of traumatic brain injury: Secondary | ICD-10-CM | POA: Diagnosis not present

## 2021-12-08 DIAGNOSIS — M542 Cervicalgia: Secondary | ICD-10-CM | POA: Diagnosis not present

## 2021-12-08 DIAGNOSIS — G311 Senile degeneration of brain, not elsewhere classified: Secondary | ICD-10-CM | POA: Diagnosis not present

## 2021-12-08 DIAGNOSIS — G44319 Acute post-traumatic headache, not intractable: Secondary | ICD-10-CM | POA: Diagnosis not present

## 2021-12-08 DIAGNOSIS — I10 Essential (primary) hypertension: Secondary | ICD-10-CM | POA: Diagnosis not present

## 2021-12-09 DIAGNOSIS — Z87820 Personal history of traumatic brain injury: Secondary | ICD-10-CM | POA: Diagnosis not present

## 2021-12-09 DIAGNOSIS — M542 Cervicalgia: Secondary | ICD-10-CM | POA: Diagnosis not present

## 2021-12-09 DIAGNOSIS — G44319 Acute post-traumatic headache, not intractable: Secondary | ICD-10-CM | POA: Diagnosis not present

## 2021-12-09 DIAGNOSIS — N184 Chronic kidney disease, stage 4 (severe): Secondary | ICD-10-CM | POA: Diagnosis not present

## 2021-12-09 DIAGNOSIS — I10 Essential (primary) hypertension: Secondary | ICD-10-CM | POA: Diagnosis not present

## 2021-12-09 DIAGNOSIS — Z515 Encounter for palliative care: Secondary | ICD-10-CM | POA: Diagnosis not present

## 2021-12-09 DIAGNOSIS — J441 Chronic obstructive pulmonary disease with (acute) exacerbation: Secondary | ICD-10-CM | POA: Diagnosis not present

## 2021-12-09 DIAGNOSIS — F329 Major depressive disorder, single episode, unspecified: Secondary | ICD-10-CM | POA: Diagnosis not present

## 2021-12-09 DIAGNOSIS — G311 Senile degeneration of brain, not elsewhere classified: Secondary | ICD-10-CM | POA: Diagnosis not present

## 2021-12-09 DIAGNOSIS — Z66 Do not resuscitate: Secondary | ICD-10-CM | POA: Diagnosis not present

## 2021-12-09 DIAGNOSIS — I509 Heart failure, unspecified: Secondary | ICD-10-CM | POA: Diagnosis not present

## 2021-12-11 DIAGNOSIS — Z87820 Personal history of traumatic brain injury: Secondary | ICD-10-CM | POA: Diagnosis not present

## 2021-12-11 DIAGNOSIS — G311 Senile degeneration of brain, not elsewhere classified: Secondary | ICD-10-CM | POA: Diagnosis not present

## 2021-12-11 DIAGNOSIS — G44319 Acute post-traumatic headache, not intractable: Secondary | ICD-10-CM | POA: Diagnosis not present

## 2021-12-11 DIAGNOSIS — I10 Essential (primary) hypertension: Secondary | ICD-10-CM | POA: Diagnosis not present

## 2021-12-11 DIAGNOSIS — M542 Cervicalgia: Secondary | ICD-10-CM | POA: Diagnosis not present

## 2021-12-11 DIAGNOSIS — I509 Heart failure, unspecified: Secondary | ICD-10-CM | POA: Diagnosis not present

## 2021-12-14 DIAGNOSIS — M542 Cervicalgia: Secondary | ICD-10-CM | POA: Diagnosis not present

## 2021-12-14 DIAGNOSIS — G311 Senile degeneration of brain, not elsewhere classified: Secondary | ICD-10-CM | POA: Diagnosis not present

## 2021-12-14 DIAGNOSIS — I10 Essential (primary) hypertension: Secondary | ICD-10-CM | POA: Diagnosis not present

## 2021-12-14 DIAGNOSIS — Z87820 Personal history of traumatic brain injury: Secondary | ICD-10-CM | POA: Diagnosis not present

## 2021-12-14 DIAGNOSIS — I509 Heart failure, unspecified: Secondary | ICD-10-CM | POA: Diagnosis not present

## 2021-12-14 DIAGNOSIS — G44319 Acute post-traumatic headache, not intractable: Secondary | ICD-10-CM | POA: Diagnosis not present

## 2021-12-15 DIAGNOSIS — G44319 Acute post-traumatic headache, not intractable: Secondary | ICD-10-CM | POA: Diagnosis not present

## 2021-12-15 DIAGNOSIS — M542 Cervicalgia: Secondary | ICD-10-CM | POA: Diagnosis not present

## 2021-12-15 DIAGNOSIS — G311 Senile degeneration of brain, not elsewhere classified: Secondary | ICD-10-CM | POA: Diagnosis not present

## 2021-12-15 DIAGNOSIS — Z87820 Personal history of traumatic brain injury: Secondary | ICD-10-CM | POA: Diagnosis not present

## 2021-12-15 DIAGNOSIS — I509 Heart failure, unspecified: Secondary | ICD-10-CM | POA: Diagnosis not present

## 2021-12-15 DIAGNOSIS — I10 Essential (primary) hypertension: Secondary | ICD-10-CM | POA: Diagnosis not present

## 2021-12-16 DIAGNOSIS — M542 Cervicalgia: Secondary | ICD-10-CM | POA: Diagnosis not present

## 2021-12-16 DIAGNOSIS — I509 Heart failure, unspecified: Secondary | ICD-10-CM | POA: Diagnosis not present

## 2021-12-16 DIAGNOSIS — G311 Senile degeneration of brain, not elsewhere classified: Secondary | ICD-10-CM | POA: Diagnosis not present

## 2021-12-16 DIAGNOSIS — Z87820 Personal history of traumatic brain injury: Secondary | ICD-10-CM | POA: Diagnosis not present

## 2021-12-16 DIAGNOSIS — I10 Essential (primary) hypertension: Secondary | ICD-10-CM | POA: Diagnosis not present

## 2021-12-16 DIAGNOSIS — G44319 Acute post-traumatic headache, not intractable: Secondary | ICD-10-CM | POA: Diagnosis not present

## 2021-12-18 DIAGNOSIS — G311 Senile degeneration of brain, not elsewhere classified: Secondary | ICD-10-CM | POA: Diagnosis not present

## 2021-12-18 DIAGNOSIS — M542 Cervicalgia: Secondary | ICD-10-CM | POA: Diagnosis not present

## 2021-12-18 DIAGNOSIS — I10 Essential (primary) hypertension: Secondary | ICD-10-CM | POA: Diagnosis not present

## 2021-12-18 DIAGNOSIS — G44319 Acute post-traumatic headache, not intractable: Secondary | ICD-10-CM | POA: Diagnosis not present

## 2021-12-18 DIAGNOSIS — I509 Heart failure, unspecified: Secondary | ICD-10-CM | POA: Diagnosis not present

## 2021-12-18 DIAGNOSIS — Z87820 Personal history of traumatic brain injury: Secondary | ICD-10-CM | POA: Diagnosis not present

## 2021-12-21 DIAGNOSIS — G311 Senile degeneration of brain, not elsewhere classified: Secondary | ICD-10-CM | POA: Diagnosis not present

## 2021-12-21 DIAGNOSIS — M542 Cervicalgia: Secondary | ICD-10-CM | POA: Diagnosis not present

## 2021-12-21 DIAGNOSIS — G44319 Acute post-traumatic headache, not intractable: Secondary | ICD-10-CM | POA: Diagnosis not present

## 2021-12-21 DIAGNOSIS — I509 Heart failure, unspecified: Secondary | ICD-10-CM | POA: Diagnosis not present

## 2021-12-21 DIAGNOSIS — Z87820 Personal history of traumatic brain injury: Secondary | ICD-10-CM | POA: Diagnosis not present

## 2021-12-21 DIAGNOSIS — I10 Essential (primary) hypertension: Secondary | ICD-10-CM | POA: Diagnosis not present

## 2021-12-22 DIAGNOSIS — G311 Senile degeneration of brain, not elsewhere classified: Secondary | ICD-10-CM | POA: Diagnosis not present

## 2021-12-22 DIAGNOSIS — I509 Heart failure, unspecified: Secondary | ICD-10-CM | POA: Diagnosis not present

## 2021-12-22 DIAGNOSIS — G44319 Acute post-traumatic headache, not intractable: Secondary | ICD-10-CM | POA: Diagnosis not present

## 2021-12-22 DIAGNOSIS — I10 Essential (primary) hypertension: Secondary | ICD-10-CM | POA: Diagnosis not present

## 2021-12-22 DIAGNOSIS — Z87820 Personal history of traumatic brain injury: Secondary | ICD-10-CM | POA: Diagnosis not present

## 2021-12-22 DIAGNOSIS — M542 Cervicalgia: Secondary | ICD-10-CM | POA: Diagnosis not present

## 2021-12-23 DIAGNOSIS — I509 Heart failure, unspecified: Secondary | ICD-10-CM | POA: Diagnosis not present

## 2021-12-23 DIAGNOSIS — I10 Essential (primary) hypertension: Secondary | ICD-10-CM | POA: Diagnosis not present

## 2021-12-23 DIAGNOSIS — G44319 Acute post-traumatic headache, not intractable: Secondary | ICD-10-CM | POA: Diagnosis not present

## 2021-12-23 DIAGNOSIS — G311 Senile degeneration of brain, not elsewhere classified: Secondary | ICD-10-CM | POA: Diagnosis not present

## 2021-12-23 DIAGNOSIS — M542 Cervicalgia: Secondary | ICD-10-CM | POA: Diagnosis not present

## 2021-12-23 DIAGNOSIS — Z87820 Personal history of traumatic brain injury: Secondary | ICD-10-CM | POA: Diagnosis not present

## 2021-12-24 DIAGNOSIS — M542 Cervicalgia: Secondary | ICD-10-CM | POA: Diagnosis not present

## 2021-12-24 DIAGNOSIS — I509 Heart failure, unspecified: Secondary | ICD-10-CM | POA: Diagnosis not present

## 2021-12-24 DIAGNOSIS — Z87820 Personal history of traumatic brain injury: Secondary | ICD-10-CM | POA: Diagnosis not present

## 2021-12-24 DIAGNOSIS — G311 Senile degeneration of brain, not elsewhere classified: Secondary | ICD-10-CM | POA: Diagnosis not present

## 2021-12-24 DIAGNOSIS — G44319 Acute post-traumatic headache, not intractable: Secondary | ICD-10-CM | POA: Diagnosis not present

## 2021-12-24 DIAGNOSIS — I10 Essential (primary) hypertension: Secondary | ICD-10-CM | POA: Diagnosis not present

## 2021-12-25 DIAGNOSIS — G44319 Acute post-traumatic headache, not intractable: Secondary | ICD-10-CM | POA: Diagnosis not present

## 2021-12-25 DIAGNOSIS — I10 Essential (primary) hypertension: Secondary | ICD-10-CM | POA: Diagnosis not present

## 2021-12-25 DIAGNOSIS — M542 Cervicalgia: Secondary | ICD-10-CM | POA: Diagnosis not present

## 2021-12-25 DIAGNOSIS — G311 Senile degeneration of brain, not elsewhere classified: Secondary | ICD-10-CM | POA: Diagnosis not present

## 2021-12-25 DIAGNOSIS — I509 Heart failure, unspecified: Secondary | ICD-10-CM | POA: Diagnosis not present

## 2021-12-25 DIAGNOSIS — Z87820 Personal history of traumatic brain injury: Secondary | ICD-10-CM | POA: Diagnosis not present

## 2021-12-28 DIAGNOSIS — I509 Heart failure, unspecified: Secondary | ICD-10-CM | POA: Diagnosis not present

## 2021-12-28 DIAGNOSIS — M542 Cervicalgia: Secondary | ICD-10-CM | POA: Diagnosis not present

## 2021-12-28 DIAGNOSIS — G311 Senile degeneration of brain, not elsewhere classified: Secondary | ICD-10-CM | POA: Diagnosis not present

## 2021-12-28 DIAGNOSIS — Z87820 Personal history of traumatic brain injury: Secondary | ICD-10-CM | POA: Diagnosis not present

## 2021-12-28 DIAGNOSIS — I10 Essential (primary) hypertension: Secondary | ICD-10-CM | POA: Diagnosis not present

## 2021-12-28 DIAGNOSIS — G44319 Acute post-traumatic headache, not intractable: Secondary | ICD-10-CM | POA: Diagnosis not present

## 2021-12-29 DIAGNOSIS — G311 Senile degeneration of brain, not elsewhere classified: Secondary | ICD-10-CM | POA: Diagnosis not present

## 2021-12-29 DIAGNOSIS — G44319 Acute post-traumatic headache, not intractable: Secondary | ICD-10-CM | POA: Diagnosis not present

## 2021-12-29 DIAGNOSIS — I10 Essential (primary) hypertension: Secondary | ICD-10-CM | POA: Diagnosis not present

## 2021-12-29 DIAGNOSIS — I509 Heart failure, unspecified: Secondary | ICD-10-CM | POA: Diagnosis not present

## 2021-12-29 DIAGNOSIS — M542 Cervicalgia: Secondary | ICD-10-CM | POA: Diagnosis not present

## 2021-12-29 DIAGNOSIS — Z87820 Personal history of traumatic brain injury: Secondary | ICD-10-CM | POA: Diagnosis not present

## 2022-01-01 DIAGNOSIS — I10 Essential (primary) hypertension: Secondary | ICD-10-CM | POA: Diagnosis not present

## 2022-01-01 DIAGNOSIS — Z87820 Personal history of traumatic brain injury: Secondary | ICD-10-CM | POA: Diagnosis not present

## 2022-01-01 DIAGNOSIS — G311 Senile degeneration of brain, not elsewhere classified: Secondary | ICD-10-CM | POA: Diagnosis not present

## 2022-01-01 DIAGNOSIS — I509 Heart failure, unspecified: Secondary | ICD-10-CM | POA: Diagnosis not present

## 2022-01-01 DIAGNOSIS — M542 Cervicalgia: Secondary | ICD-10-CM | POA: Diagnosis not present

## 2022-01-01 DIAGNOSIS — G44319 Acute post-traumatic headache, not intractable: Secondary | ICD-10-CM | POA: Diagnosis not present

## 2022-01-04 DIAGNOSIS — G311 Senile degeneration of brain, not elsewhere classified: Secondary | ICD-10-CM | POA: Diagnosis not present

## 2022-01-04 DIAGNOSIS — I509 Heart failure, unspecified: Secondary | ICD-10-CM | POA: Diagnosis not present

## 2022-01-04 DIAGNOSIS — M542 Cervicalgia: Secondary | ICD-10-CM | POA: Diagnosis not present

## 2022-01-04 DIAGNOSIS — G44319 Acute post-traumatic headache, not intractable: Secondary | ICD-10-CM | POA: Diagnosis not present

## 2022-01-04 DIAGNOSIS — Z87820 Personal history of traumatic brain injury: Secondary | ICD-10-CM | POA: Diagnosis not present

## 2022-01-04 DIAGNOSIS — I10 Essential (primary) hypertension: Secondary | ICD-10-CM | POA: Diagnosis not present

## 2022-01-05 DIAGNOSIS — Z87820 Personal history of traumatic brain injury: Secondary | ICD-10-CM | POA: Diagnosis not present

## 2022-01-05 DIAGNOSIS — G311 Senile degeneration of brain, not elsewhere classified: Secondary | ICD-10-CM | POA: Diagnosis not present

## 2022-01-05 DIAGNOSIS — G44319 Acute post-traumatic headache, not intractable: Secondary | ICD-10-CM | POA: Diagnosis not present

## 2022-01-05 DIAGNOSIS — M542 Cervicalgia: Secondary | ICD-10-CM | POA: Diagnosis not present

## 2022-01-05 DIAGNOSIS — I509 Heart failure, unspecified: Secondary | ICD-10-CM | POA: Diagnosis not present

## 2022-01-05 DIAGNOSIS — I10 Essential (primary) hypertension: Secondary | ICD-10-CM | POA: Diagnosis not present

## 2022-01-06 DIAGNOSIS — I509 Heart failure, unspecified: Secondary | ICD-10-CM | POA: Diagnosis not present

## 2022-01-06 DIAGNOSIS — M542 Cervicalgia: Secondary | ICD-10-CM | POA: Diagnosis not present

## 2022-01-06 DIAGNOSIS — Z87820 Personal history of traumatic brain injury: Secondary | ICD-10-CM | POA: Diagnosis not present

## 2022-01-06 DIAGNOSIS — I10 Essential (primary) hypertension: Secondary | ICD-10-CM | POA: Diagnosis not present

## 2022-01-06 DIAGNOSIS — G311 Senile degeneration of brain, not elsewhere classified: Secondary | ICD-10-CM | POA: Diagnosis not present

## 2022-01-06 DIAGNOSIS — G44319 Acute post-traumatic headache, not intractable: Secondary | ICD-10-CM | POA: Diagnosis not present

## 2022-01-08 DIAGNOSIS — I10 Essential (primary) hypertension: Secondary | ICD-10-CM | POA: Diagnosis not present

## 2022-01-08 DIAGNOSIS — F329 Major depressive disorder, single episode, unspecified: Secondary | ICD-10-CM | POA: Diagnosis not present

## 2022-01-08 DIAGNOSIS — Z515 Encounter for palliative care: Secondary | ICD-10-CM | POA: Diagnosis not present

## 2022-01-08 DIAGNOSIS — N184 Chronic kidney disease, stage 4 (severe): Secondary | ICD-10-CM | POA: Diagnosis not present

## 2022-01-08 DIAGNOSIS — Z87820 Personal history of traumatic brain injury: Secondary | ICD-10-CM | POA: Diagnosis not present

## 2022-01-08 DIAGNOSIS — I509 Heart failure, unspecified: Secondary | ICD-10-CM | POA: Diagnosis not present

## 2022-01-08 DIAGNOSIS — G44319 Acute post-traumatic headache, not intractable: Secondary | ICD-10-CM | POA: Diagnosis not present

## 2022-01-08 DIAGNOSIS — J441 Chronic obstructive pulmonary disease with (acute) exacerbation: Secondary | ICD-10-CM | POA: Diagnosis not present

## 2022-01-08 DIAGNOSIS — G311 Senile degeneration of brain, not elsewhere classified: Secondary | ICD-10-CM | POA: Diagnosis not present

## 2022-01-08 DIAGNOSIS — Z66 Do not resuscitate: Secondary | ICD-10-CM | POA: Diagnosis not present

## 2022-01-08 DIAGNOSIS — M542 Cervicalgia: Secondary | ICD-10-CM | POA: Diagnosis not present

## 2022-01-11 DIAGNOSIS — G44319 Acute post-traumatic headache, not intractable: Secondary | ICD-10-CM | POA: Diagnosis not present

## 2022-01-11 DIAGNOSIS — I10 Essential (primary) hypertension: Secondary | ICD-10-CM | POA: Diagnosis not present

## 2022-01-11 DIAGNOSIS — I509 Heart failure, unspecified: Secondary | ICD-10-CM | POA: Diagnosis not present

## 2022-01-11 DIAGNOSIS — G311 Senile degeneration of brain, not elsewhere classified: Secondary | ICD-10-CM | POA: Diagnosis not present

## 2022-01-11 DIAGNOSIS — Z87820 Personal history of traumatic brain injury: Secondary | ICD-10-CM | POA: Diagnosis not present

## 2022-01-11 DIAGNOSIS — M542 Cervicalgia: Secondary | ICD-10-CM | POA: Diagnosis not present

## 2022-01-12 DIAGNOSIS — G44319 Acute post-traumatic headache, not intractable: Secondary | ICD-10-CM | POA: Diagnosis not present

## 2022-01-12 DIAGNOSIS — Z87820 Personal history of traumatic brain injury: Secondary | ICD-10-CM | POA: Diagnosis not present

## 2022-01-12 DIAGNOSIS — I509 Heart failure, unspecified: Secondary | ICD-10-CM | POA: Diagnosis not present

## 2022-01-12 DIAGNOSIS — I10 Essential (primary) hypertension: Secondary | ICD-10-CM | POA: Diagnosis not present

## 2022-01-12 DIAGNOSIS — M542 Cervicalgia: Secondary | ICD-10-CM | POA: Diagnosis not present

## 2022-01-12 DIAGNOSIS — G311 Senile degeneration of brain, not elsewhere classified: Secondary | ICD-10-CM | POA: Diagnosis not present

## 2022-01-13 DIAGNOSIS — M542 Cervicalgia: Secondary | ICD-10-CM | POA: Diagnosis not present

## 2022-01-13 DIAGNOSIS — I509 Heart failure, unspecified: Secondary | ICD-10-CM | POA: Diagnosis not present

## 2022-01-13 DIAGNOSIS — Z87820 Personal history of traumatic brain injury: Secondary | ICD-10-CM | POA: Diagnosis not present

## 2022-01-13 DIAGNOSIS — G44319 Acute post-traumatic headache, not intractable: Secondary | ICD-10-CM | POA: Diagnosis not present

## 2022-01-13 DIAGNOSIS — G311 Senile degeneration of brain, not elsewhere classified: Secondary | ICD-10-CM | POA: Diagnosis not present

## 2022-01-13 DIAGNOSIS — I10 Essential (primary) hypertension: Secondary | ICD-10-CM | POA: Diagnosis not present

## 2022-01-15 DIAGNOSIS — I10 Essential (primary) hypertension: Secondary | ICD-10-CM | POA: Diagnosis not present

## 2022-01-15 DIAGNOSIS — Z87820 Personal history of traumatic brain injury: Secondary | ICD-10-CM | POA: Diagnosis not present

## 2022-01-15 DIAGNOSIS — G44319 Acute post-traumatic headache, not intractable: Secondary | ICD-10-CM | POA: Diagnosis not present

## 2022-01-15 DIAGNOSIS — M542 Cervicalgia: Secondary | ICD-10-CM | POA: Diagnosis not present

## 2022-01-15 DIAGNOSIS — G311 Senile degeneration of brain, not elsewhere classified: Secondary | ICD-10-CM | POA: Diagnosis not present

## 2022-01-15 DIAGNOSIS — I509 Heart failure, unspecified: Secondary | ICD-10-CM | POA: Diagnosis not present

## 2022-01-18 DIAGNOSIS — M542 Cervicalgia: Secondary | ICD-10-CM | POA: Diagnosis not present

## 2022-01-18 DIAGNOSIS — Z87820 Personal history of traumatic brain injury: Secondary | ICD-10-CM | POA: Diagnosis not present

## 2022-01-18 DIAGNOSIS — G44319 Acute post-traumatic headache, not intractable: Secondary | ICD-10-CM | POA: Diagnosis not present

## 2022-01-18 DIAGNOSIS — I509 Heart failure, unspecified: Secondary | ICD-10-CM | POA: Diagnosis not present

## 2022-01-18 DIAGNOSIS — I10 Essential (primary) hypertension: Secondary | ICD-10-CM | POA: Diagnosis not present

## 2022-01-18 DIAGNOSIS — G311 Senile degeneration of brain, not elsewhere classified: Secondary | ICD-10-CM | POA: Diagnosis not present

## 2022-01-19 ENCOUNTER — Encounter: Payer: Self-pay | Admitting: Internal Medicine

## 2022-01-19 DIAGNOSIS — Z87820 Personal history of traumatic brain injury: Secondary | ICD-10-CM | POA: Diagnosis not present

## 2022-01-19 DIAGNOSIS — G311 Senile degeneration of brain, not elsewhere classified: Secondary | ICD-10-CM | POA: Diagnosis not present

## 2022-01-19 DIAGNOSIS — G44319 Acute post-traumatic headache, not intractable: Secondary | ICD-10-CM | POA: Diagnosis not present

## 2022-01-19 DIAGNOSIS — I10 Essential (primary) hypertension: Secondary | ICD-10-CM | POA: Diagnosis not present

## 2022-01-19 DIAGNOSIS — M542 Cervicalgia: Secondary | ICD-10-CM | POA: Diagnosis not present

## 2022-01-19 DIAGNOSIS — I509 Heart failure, unspecified: Secondary | ICD-10-CM | POA: Diagnosis not present

## 2022-01-20 DIAGNOSIS — Z87820 Personal history of traumatic brain injury: Secondary | ICD-10-CM | POA: Diagnosis not present

## 2022-01-20 DIAGNOSIS — M542 Cervicalgia: Secondary | ICD-10-CM | POA: Diagnosis not present

## 2022-01-20 DIAGNOSIS — I10 Essential (primary) hypertension: Secondary | ICD-10-CM | POA: Diagnosis not present

## 2022-01-20 DIAGNOSIS — G44319 Acute post-traumatic headache, not intractable: Secondary | ICD-10-CM | POA: Diagnosis not present

## 2022-01-20 DIAGNOSIS — G311 Senile degeneration of brain, not elsewhere classified: Secondary | ICD-10-CM | POA: Diagnosis not present

## 2022-01-20 DIAGNOSIS — I509 Heart failure, unspecified: Secondary | ICD-10-CM | POA: Diagnosis not present

## 2022-01-22 DIAGNOSIS — M542 Cervicalgia: Secondary | ICD-10-CM | POA: Diagnosis not present

## 2022-01-22 DIAGNOSIS — Z87820 Personal history of traumatic brain injury: Secondary | ICD-10-CM | POA: Diagnosis not present

## 2022-01-22 DIAGNOSIS — I509 Heart failure, unspecified: Secondary | ICD-10-CM | POA: Diagnosis not present

## 2022-01-22 DIAGNOSIS — G44319 Acute post-traumatic headache, not intractable: Secondary | ICD-10-CM | POA: Diagnosis not present

## 2022-01-22 DIAGNOSIS — I10 Essential (primary) hypertension: Secondary | ICD-10-CM | POA: Diagnosis not present

## 2022-01-22 DIAGNOSIS — G311 Senile degeneration of brain, not elsewhere classified: Secondary | ICD-10-CM | POA: Diagnosis not present

## 2022-01-25 DIAGNOSIS — G44319 Acute post-traumatic headache, not intractable: Secondary | ICD-10-CM | POA: Diagnosis not present

## 2022-01-25 DIAGNOSIS — I509 Heart failure, unspecified: Secondary | ICD-10-CM | POA: Diagnosis not present

## 2022-01-25 DIAGNOSIS — G311 Senile degeneration of brain, not elsewhere classified: Secondary | ICD-10-CM | POA: Diagnosis not present

## 2022-01-25 DIAGNOSIS — I10 Essential (primary) hypertension: Secondary | ICD-10-CM | POA: Diagnosis not present

## 2022-01-25 DIAGNOSIS — M542 Cervicalgia: Secondary | ICD-10-CM | POA: Diagnosis not present

## 2022-01-25 DIAGNOSIS — Z87820 Personal history of traumatic brain injury: Secondary | ICD-10-CM | POA: Diagnosis not present

## 2022-01-28 DIAGNOSIS — M542 Cervicalgia: Secondary | ICD-10-CM | POA: Diagnosis not present

## 2022-01-28 DIAGNOSIS — G44319 Acute post-traumatic headache, not intractable: Secondary | ICD-10-CM | POA: Diagnosis not present

## 2022-01-28 DIAGNOSIS — G311 Senile degeneration of brain, not elsewhere classified: Secondary | ICD-10-CM | POA: Diagnosis not present

## 2022-01-28 DIAGNOSIS — Z87820 Personal history of traumatic brain injury: Secondary | ICD-10-CM | POA: Diagnosis not present

## 2022-01-28 DIAGNOSIS — I10 Essential (primary) hypertension: Secondary | ICD-10-CM | POA: Diagnosis not present

## 2022-01-28 DIAGNOSIS — I509 Heart failure, unspecified: Secondary | ICD-10-CM | POA: Diagnosis not present

## 2022-02-01 DIAGNOSIS — G44319 Acute post-traumatic headache, not intractable: Secondary | ICD-10-CM | POA: Diagnosis not present

## 2022-02-01 DIAGNOSIS — M542 Cervicalgia: Secondary | ICD-10-CM | POA: Diagnosis not present

## 2022-02-01 DIAGNOSIS — G311 Senile degeneration of brain, not elsewhere classified: Secondary | ICD-10-CM | POA: Diagnosis not present

## 2022-02-01 DIAGNOSIS — I10 Essential (primary) hypertension: Secondary | ICD-10-CM | POA: Diagnosis not present

## 2022-02-01 DIAGNOSIS — I509 Heart failure, unspecified: Secondary | ICD-10-CM | POA: Diagnosis not present

## 2022-02-01 DIAGNOSIS — Z87820 Personal history of traumatic brain injury: Secondary | ICD-10-CM | POA: Diagnosis not present

## 2022-02-04 DIAGNOSIS — I509 Heart failure, unspecified: Secondary | ICD-10-CM | POA: Diagnosis not present

## 2022-02-04 DIAGNOSIS — Z87820 Personal history of traumatic brain injury: Secondary | ICD-10-CM | POA: Diagnosis not present

## 2022-02-04 DIAGNOSIS — G311 Senile degeneration of brain, not elsewhere classified: Secondary | ICD-10-CM | POA: Diagnosis not present

## 2022-02-04 DIAGNOSIS — I10 Essential (primary) hypertension: Secondary | ICD-10-CM | POA: Diagnosis not present

## 2022-02-04 DIAGNOSIS — M542 Cervicalgia: Secondary | ICD-10-CM | POA: Diagnosis not present

## 2022-02-04 DIAGNOSIS — G44319 Acute post-traumatic headache, not intractable: Secondary | ICD-10-CM | POA: Diagnosis not present

## 2022-02-08 DIAGNOSIS — G44319 Acute post-traumatic headache, not intractable: Secondary | ICD-10-CM | POA: Diagnosis not present

## 2022-02-08 DIAGNOSIS — Z66 Do not resuscitate: Secondary | ICD-10-CM | POA: Diagnosis not present

## 2022-02-08 DIAGNOSIS — J441 Chronic obstructive pulmonary disease with (acute) exacerbation: Secondary | ICD-10-CM | POA: Diagnosis not present

## 2022-02-08 DIAGNOSIS — I509 Heart failure, unspecified: Secondary | ICD-10-CM | POA: Diagnosis not present

## 2022-02-08 DIAGNOSIS — F329 Major depressive disorder, single episode, unspecified: Secondary | ICD-10-CM | POA: Diagnosis not present

## 2022-02-08 DIAGNOSIS — M542 Cervicalgia: Secondary | ICD-10-CM | POA: Diagnosis not present

## 2022-02-08 DIAGNOSIS — Z87820 Personal history of traumatic brain injury: Secondary | ICD-10-CM | POA: Diagnosis not present

## 2022-02-08 DIAGNOSIS — G311 Senile degeneration of brain, not elsewhere classified: Secondary | ICD-10-CM | POA: Diagnosis not present

## 2022-02-08 DIAGNOSIS — Z515 Encounter for palliative care: Secondary | ICD-10-CM | POA: Diagnosis not present

## 2022-02-08 DIAGNOSIS — I10 Essential (primary) hypertension: Secondary | ICD-10-CM | POA: Diagnosis not present

## 2022-02-08 DIAGNOSIS — N184 Chronic kidney disease, stage 4 (severe): Secondary | ICD-10-CM | POA: Diagnosis not present

## 2022-02-09 ENCOUNTER — Ambulatory Visit (INDEPENDENT_AMBULATORY_CARE_PROVIDER_SITE_OTHER): Payer: Medicare Other

## 2022-02-09 DIAGNOSIS — I509 Heart failure, unspecified: Secondary | ICD-10-CM | POA: Diagnosis not present

## 2022-02-09 DIAGNOSIS — I495 Sick sinus syndrome: Secondary | ICD-10-CM | POA: Diagnosis not present

## 2022-02-09 DIAGNOSIS — I10 Essential (primary) hypertension: Secondary | ICD-10-CM | POA: Diagnosis not present

## 2022-02-09 DIAGNOSIS — G311 Senile degeneration of brain, not elsewhere classified: Secondary | ICD-10-CM | POA: Diagnosis not present

## 2022-02-09 DIAGNOSIS — M542 Cervicalgia: Secondary | ICD-10-CM | POA: Diagnosis not present

## 2022-02-09 DIAGNOSIS — G44319 Acute post-traumatic headache, not intractable: Secondary | ICD-10-CM | POA: Diagnosis not present

## 2022-02-09 DIAGNOSIS — Z87820 Personal history of traumatic brain injury: Secondary | ICD-10-CM | POA: Diagnosis not present

## 2022-02-10 DIAGNOSIS — Z87820 Personal history of traumatic brain injury: Secondary | ICD-10-CM | POA: Diagnosis not present

## 2022-02-10 DIAGNOSIS — I10 Essential (primary) hypertension: Secondary | ICD-10-CM | POA: Diagnosis not present

## 2022-02-10 DIAGNOSIS — M542 Cervicalgia: Secondary | ICD-10-CM | POA: Diagnosis not present

## 2022-02-10 DIAGNOSIS — I509 Heart failure, unspecified: Secondary | ICD-10-CM | POA: Diagnosis not present

## 2022-02-10 DIAGNOSIS — G311 Senile degeneration of brain, not elsewhere classified: Secondary | ICD-10-CM | POA: Diagnosis not present

## 2022-02-10 DIAGNOSIS — G44319 Acute post-traumatic headache, not intractable: Secondary | ICD-10-CM | POA: Diagnosis not present

## 2022-02-10 LAB — CUP PACEART REMOTE DEVICE CHECK
Battery Remaining Longevity: 48 mo
Battery Remaining Percentage: 81 %
Brady Statistic RA Percent Paced: 70 %
Brady Statistic RV Percent Paced: 0 %
Date Time Interrogation Session: 20240103032100
Implantable Lead Connection Status: 753985
Implantable Lead Connection Status: 753985
Implantable Lead Implant Date: 20180524
Implantable Lead Implant Date: 20180524
Implantable Lead Location: 753859
Implantable Lead Location: 753860
Implantable Lead Model: 7740
Implantable Lead Model: 7741
Implantable Lead Serial Number: 693708
Implantable Lead Serial Number: 861246
Implantable Pulse Generator Implant Date: 20180524
Lead Channel Impedance Value: 538 Ohm
Lead Channel Impedance Value: 554 Ohm
Lead Channel Pacing Threshold Amplitude: 0.5 V
Lead Channel Pacing Threshold Amplitude: 2.9 V
Lead Channel Pacing Threshold Pulse Width: 0.4 ms
Lead Channel Pacing Threshold Pulse Width: 0.4 ms
Lead Channel Setting Pacing Amplitude: 2 V
Lead Channel Setting Pacing Amplitude: 2.4 V
Lead Channel Setting Pacing Pulse Width: 0.4 ms
Lead Channel Setting Sensing Sensitivity: 2 mV
Pulse Gen Serial Number: 308044
Zone Setting Status: 755011

## 2022-02-12 DIAGNOSIS — G311 Senile degeneration of brain, not elsewhere classified: Secondary | ICD-10-CM | POA: Diagnosis not present

## 2022-02-12 DIAGNOSIS — M542 Cervicalgia: Secondary | ICD-10-CM | POA: Diagnosis not present

## 2022-02-12 DIAGNOSIS — I509 Heart failure, unspecified: Secondary | ICD-10-CM | POA: Diagnosis not present

## 2022-02-12 DIAGNOSIS — Z87820 Personal history of traumatic brain injury: Secondary | ICD-10-CM | POA: Diagnosis not present

## 2022-02-12 DIAGNOSIS — G44319 Acute post-traumatic headache, not intractable: Secondary | ICD-10-CM | POA: Diagnosis not present

## 2022-02-12 DIAGNOSIS — I10 Essential (primary) hypertension: Secondary | ICD-10-CM | POA: Diagnosis not present

## 2022-02-14 DIAGNOSIS — G44319 Acute post-traumatic headache, not intractable: Secondary | ICD-10-CM | POA: Diagnosis not present

## 2022-02-14 DIAGNOSIS — I10 Essential (primary) hypertension: Secondary | ICD-10-CM | POA: Diagnosis not present

## 2022-02-14 DIAGNOSIS — Z87820 Personal history of traumatic brain injury: Secondary | ICD-10-CM | POA: Diagnosis not present

## 2022-02-14 DIAGNOSIS — M542 Cervicalgia: Secondary | ICD-10-CM | POA: Diagnosis not present

## 2022-02-14 DIAGNOSIS — I509 Heart failure, unspecified: Secondary | ICD-10-CM | POA: Diagnosis not present

## 2022-02-14 DIAGNOSIS — G311 Senile degeneration of brain, not elsewhere classified: Secondary | ICD-10-CM | POA: Diagnosis not present

## 2022-02-15 DIAGNOSIS — Z87820 Personal history of traumatic brain injury: Secondary | ICD-10-CM | POA: Diagnosis not present

## 2022-02-15 DIAGNOSIS — G44319 Acute post-traumatic headache, not intractable: Secondary | ICD-10-CM | POA: Diagnosis not present

## 2022-02-15 DIAGNOSIS — G311 Senile degeneration of brain, not elsewhere classified: Secondary | ICD-10-CM | POA: Diagnosis not present

## 2022-02-15 DIAGNOSIS — M542 Cervicalgia: Secondary | ICD-10-CM | POA: Diagnosis not present

## 2022-02-15 DIAGNOSIS — I509 Heart failure, unspecified: Secondary | ICD-10-CM | POA: Diagnosis not present

## 2022-02-15 DIAGNOSIS — I10 Essential (primary) hypertension: Secondary | ICD-10-CM | POA: Diagnosis not present

## 2022-02-17 DIAGNOSIS — M542 Cervicalgia: Secondary | ICD-10-CM | POA: Diagnosis not present

## 2022-02-17 DIAGNOSIS — I10 Essential (primary) hypertension: Secondary | ICD-10-CM | POA: Diagnosis not present

## 2022-02-17 DIAGNOSIS — I509 Heart failure, unspecified: Secondary | ICD-10-CM | POA: Diagnosis not present

## 2022-02-17 DIAGNOSIS — Z87820 Personal history of traumatic brain injury: Secondary | ICD-10-CM | POA: Diagnosis not present

## 2022-02-17 DIAGNOSIS — G44319 Acute post-traumatic headache, not intractable: Secondary | ICD-10-CM | POA: Diagnosis not present

## 2022-02-17 DIAGNOSIS — G311 Senile degeneration of brain, not elsewhere classified: Secondary | ICD-10-CM | POA: Diagnosis not present

## 2022-02-19 DIAGNOSIS — I509 Heart failure, unspecified: Secondary | ICD-10-CM | POA: Diagnosis not present

## 2022-02-19 DIAGNOSIS — G311 Senile degeneration of brain, not elsewhere classified: Secondary | ICD-10-CM | POA: Diagnosis not present

## 2022-02-19 DIAGNOSIS — G44319 Acute post-traumatic headache, not intractable: Secondary | ICD-10-CM | POA: Diagnosis not present

## 2022-02-19 DIAGNOSIS — M542 Cervicalgia: Secondary | ICD-10-CM | POA: Diagnosis not present

## 2022-02-19 DIAGNOSIS — Z87820 Personal history of traumatic brain injury: Secondary | ICD-10-CM | POA: Diagnosis not present

## 2022-02-19 DIAGNOSIS — I10 Essential (primary) hypertension: Secondary | ICD-10-CM | POA: Diagnosis not present

## 2022-02-21 DIAGNOSIS — G311 Senile degeneration of brain, not elsewhere classified: Secondary | ICD-10-CM | POA: Diagnosis not present

## 2022-02-21 DIAGNOSIS — M542 Cervicalgia: Secondary | ICD-10-CM | POA: Diagnosis not present

## 2022-02-21 DIAGNOSIS — I10 Essential (primary) hypertension: Secondary | ICD-10-CM | POA: Diagnosis not present

## 2022-02-21 DIAGNOSIS — G44319 Acute post-traumatic headache, not intractable: Secondary | ICD-10-CM | POA: Diagnosis not present

## 2022-02-21 DIAGNOSIS — I509 Heart failure, unspecified: Secondary | ICD-10-CM | POA: Diagnosis not present

## 2022-02-21 DIAGNOSIS — Z87820 Personal history of traumatic brain injury: Secondary | ICD-10-CM | POA: Diagnosis not present

## 2022-02-22 DIAGNOSIS — M542 Cervicalgia: Secondary | ICD-10-CM | POA: Diagnosis not present

## 2022-02-22 DIAGNOSIS — G44319 Acute post-traumatic headache, not intractable: Secondary | ICD-10-CM | POA: Diagnosis not present

## 2022-02-22 DIAGNOSIS — Z87820 Personal history of traumatic brain injury: Secondary | ICD-10-CM | POA: Diagnosis not present

## 2022-02-22 DIAGNOSIS — I509 Heart failure, unspecified: Secondary | ICD-10-CM | POA: Diagnosis not present

## 2022-02-22 DIAGNOSIS — G311 Senile degeneration of brain, not elsewhere classified: Secondary | ICD-10-CM | POA: Diagnosis not present

## 2022-02-22 DIAGNOSIS — I10 Essential (primary) hypertension: Secondary | ICD-10-CM | POA: Diagnosis not present

## 2022-02-24 DIAGNOSIS — G44319 Acute post-traumatic headache, not intractable: Secondary | ICD-10-CM | POA: Diagnosis not present

## 2022-02-24 DIAGNOSIS — G311 Senile degeneration of brain, not elsewhere classified: Secondary | ICD-10-CM | POA: Diagnosis not present

## 2022-02-24 DIAGNOSIS — Z87820 Personal history of traumatic brain injury: Secondary | ICD-10-CM | POA: Diagnosis not present

## 2022-02-24 DIAGNOSIS — I10 Essential (primary) hypertension: Secondary | ICD-10-CM | POA: Diagnosis not present

## 2022-02-24 DIAGNOSIS — M542 Cervicalgia: Secondary | ICD-10-CM | POA: Diagnosis not present

## 2022-02-24 DIAGNOSIS — I509 Heart failure, unspecified: Secondary | ICD-10-CM | POA: Diagnosis not present

## 2022-02-25 ENCOUNTER — Telehealth: Payer: Self-pay | Admitting: Internal Medicine

## 2022-02-25 DIAGNOSIS — M542 Cervicalgia: Secondary | ICD-10-CM | POA: Diagnosis not present

## 2022-02-25 DIAGNOSIS — Z87820 Personal history of traumatic brain injury: Secondary | ICD-10-CM | POA: Diagnosis not present

## 2022-02-25 DIAGNOSIS — I509 Heart failure, unspecified: Secondary | ICD-10-CM | POA: Diagnosis not present

## 2022-02-25 DIAGNOSIS — G311 Senile degeneration of brain, not elsewhere classified: Secondary | ICD-10-CM | POA: Diagnosis not present

## 2022-02-25 DIAGNOSIS — G44319 Acute post-traumatic headache, not intractable: Secondary | ICD-10-CM | POA: Diagnosis not present

## 2022-02-25 DIAGNOSIS — I10 Essential (primary) hypertension: Secondary | ICD-10-CM | POA: Diagnosis not present

## 2022-02-25 MED ORDER — FUROSEMIDE 20 MG PO TABS: 20.0000 mg | ORAL_TABLET | Freq: Two times a day (BID) | ORAL | 3 refills | Status: DC

## 2022-02-25 NOTE — Telephone Encounter (Signed)
Spoke with patient's granddaughter [name] who states the patient has been without Jardiance for 4 days due it not being covered under her hospice benefit. She is experiencing increased SOB.   Reviewed with Dr Ali Lowe, new orders received. Stop Jardiance and start Lasix '20mg'$  BID.  Reviewed instructions with Wynona Canes who verbalized understanding.

## 2022-02-25 NOTE — Telephone Encounter (Signed)
Pt c/o medication issue:  1. Name of Medication:   empagliflozin (JARDIANCE) 10 MG TABS tablet   2. How are you currently taking this medication (dosage and times per day)?  As prescribed  3. Are you having a reaction (difficulty breathing--STAT)?   4. What is your medication issue?   Granddaughter stated the patient has not on this medication for 4 days.  Granddaughter stated the patient is in hospice for heart failure and they would like to get samples or an alternate medication.

## 2022-02-26 DIAGNOSIS — G311 Senile degeneration of brain, not elsewhere classified: Secondary | ICD-10-CM | POA: Diagnosis not present

## 2022-02-26 DIAGNOSIS — Z87820 Personal history of traumatic brain injury: Secondary | ICD-10-CM | POA: Diagnosis not present

## 2022-02-26 DIAGNOSIS — G44319 Acute post-traumatic headache, not intractable: Secondary | ICD-10-CM | POA: Diagnosis not present

## 2022-02-26 DIAGNOSIS — I509 Heart failure, unspecified: Secondary | ICD-10-CM | POA: Diagnosis not present

## 2022-02-26 DIAGNOSIS — I10 Essential (primary) hypertension: Secondary | ICD-10-CM | POA: Diagnosis not present

## 2022-02-26 DIAGNOSIS — M542 Cervicalgia: Secondary | ICD-10-CM | POA: Diagnosis not present

## 2022-03-01 DIAGNOSIS — I10 Essential (primary) hypertension: Secondary | ICD-10-CM | POA: Diagnosis not present

## 2022-03-01 DIAGNOSIS — Z87820 Personal history of traumatic brain injury: Secondary | ICD-10-CM | POA: Diagnosis not present

## 2022-03-01 DIAGNOSIS — G311 Senile degeneration of brain, not elsewhere classified: Secondary | ICD-10-CM | POA: Diagnosis not present

## 2022-03-01 DIAGNOSIS — I509 Heart failure, unspecified: Secondary | ICD-10-CM | POA: Diagnosis not present

## 2022-03-01 DIAGNOSIS — G44319 Acute post-traumatic headache, not intractable: Secondary | ICD-10-CM | POA: Diagnosis not present

## 2022-03-01 DIAGNOSIS — M542 Cervicalgia: Secondary | ICD-10-CM | POA: Diagnosis not present

## 2022-03-03 DIAGNOSIS — G311 Senile degeneration of brain, not elsewhere classified: Secondary | ICD-10-CM | POA: Diagnosis not present

## 2022-03-03 DIAGNOSIS — G44319 Acute post-traumatic headache, not intractable: Secondary | ICD-10-CM | POA: Diagnosis not present

## 2022-03-03 DIAGNOSIS — I509 Heart failure, unspecified: Secondary | ICD-10-CM | POA: Diagnosis not present

## 2022-03-03 DIAGNOSIS — Z87820 Personal history of traumatic brain injury: Secondary | ICD-10-CM | POA: Diagnosis not present

## 2022-03-03 DIAGNOSIS — M542 Cervicalgia: Secondary | ICD-10-CM | POA: Diagnosis not present

## 2022-03-03 DIAGNOSIS — I10 Essential (primary) hypertension: Secondary | ICD-10-CM | POA: Diagnosis not present

## 2022-03-05 DIAGNOSIS — Z87820 Personal history of traumatic brain injury: Secondary | ICD-10-CM | POA: Diagnosis not present

## 2022-03-05 DIAGNOSIS — G44319 Acute post-traumatic headache, not intractable: Secondary | ICD-10-CM | POA: Diagnosis not present

## 2022-03-05 DIAGNOSIS — M542 Cervicalgia: Secondary | ICD-10-CM | POA: Diagnosis not present

## 2022-03-05 DIAGNOSIS — G311 Senile degeneration of brain, not elsewhere classified: Secondary | ICD-10-CM | POA: Diagnosis not present

## 2022-03-05 DIAGNOSIS — I10 Essential (primary) hypertension: Secondary | ICD-10-CM | POA: Diagnosis not present

## 2022-03-05 DIAGNOSIS — I509 Heart failure, unspecified: Secondary | ICD-10-CM | POA: Diagnosis not present

## 2022-03-08 DIAGNOSIS — M542 Cervicalgia: Secondary | ICD-10-CM | POA: Diagnosis not present

## 2022-03-08 DIAGNOSIS — I10 Essential (primary) hypertension: Secondary | ICD-10-CM | POA: Diagnosis not present

## 2022-03-08 DIAGNOSIS — I509 Heart failure, unspecified: Secondary | ICD-10-CM | POA: Diagnosis not present

## 2022-03-08 DIAGNOSIS — Z87820 Personal history of traumatic brain injury: Secondary | ICD-10-CM | POA: Diagnosis not present

## 2022-03-08 DIAGNOSIS — G311 Senile degeneration of brain, not elsewhere classified: Secondary | ICD-10-CM | POA: Diagnosis not present

## 2022-03-08 DIAGNOSIS — G44319 Acute post-traumatic headache, not intractable: Secondary | ICD-10-CM | POA: Diagnosis not present

## 2022-03-09 DIAGNOSIS — G311 Senile degeneration of brain, not elsewhere classified: Secondary | ICD-10-CM | POA: Diagnosis not present

## 2022-03-09 DIAGNOSIS — I10 Essential (primary) hypertension: Secondary | ICD-10-CM | POA: Diagnosis not present

## 2022-03-09 DIAGNOSIS — Z87820 Personal history of traumatic brain injury: Secondary | ICD-10-CM | POA: Diagnosis not present

## 2022-03-09 DIAGNOSIS — G44319 Acute post-traumatic headache, not intractable: Secondary | ICD-10-CM | POA: Diagnosis not present

## 2022-03-09 DIAGNOSIS — M542 Cervicalgia: Secondary | ICD-10-CM | POA: Diagnosis not present

## 2022-03-09 DIAGNOSIS — I509 Heart failure, unspecified: Secondary | ICD-10-CM | POA: Diagnosis not present

## 2022-03-10 DIAGNOSIS — G311 Senile degeneration of brain, not elsewhere classified: Secondary | ICD-10-CM | POA: Diagnosis not present

## 2022-03-10 DIAGNOSIS — I509 Heart failure, unspecified: Secondary | ICD-10-CM | POA: Diagnosis not present

## 2022-03-10 DIAGNOSIS — Z87820 Personal history of traumatic brain injury: Secondary | ICD-10-CM | POA: Diagnosis not present

## 2022-03-10 DIAGNOSIS — G44319 Acute post-traumatic headache, not intractable: Secondary | ICD-10-CM | POA: Diagnosis not present

## 2022-03-10 DIAGNOSIS — M542 Cervicalgia: Secondary | ICD-10-CM | POA: Diagnosis not present

## 2022-03-10 DIAGNOSIS — I10 Essential (primary) hypertension: Secondary | ICD-10-CM | POA: Diagnosis not present

## 2022-03-11 DIAGNOSIS — I10 Essential (primary) hypertension: Secondary | ICD-10-CM | POA: Diagnosis not present

## 2022-03-11 DIAGNOSIS — G44319 Acute post-traumatic headache, not intractable: Secondary | ICD-10-CM | POA: Diagnosis not present

## 2022-03-11 DIAGNOSIS — Z66 Do not resuscitate: Secondary | ICD-10-CM | POA: Diagnosis not present

## 2022-03-11 DIAGNOSIS — Z87820 Personal history of traumatic brain injury: Secondary | ICD-10-CM | POA: Diagnosis not present

## 2022-03-11 DIAGNOSIS — I509 Heart failure, unspecified: Secondary | ICD-10-CM | POA: Diagnosis not present

## 2022-03-11 DIAGNOSIS — G311 Senile degeneration of brain, not elsewhere classified: Secondary | ICD-10-CM | POA: Diagnosis not present

## 2022-03-11 DIAGNOSIS — M542 Cervicalgia: Secondary | ICD-10-CM | POA: Diagnosis not present

## 2022-03-11 DIAGNOSIS — N184 Chronic kidney disease, stage 4 (severe): Secondary | ICD-10-CM | POA: Diagnosis not present

## 2022-03-11 DIAGNOSIS — J441 Chronic obstructive pulmonary disease with (acute) exacerbation: Secondary | ICD-10-CM | POA: Diagnosis not present

## 2022-03-11 DIAGNOSIS — Z515 Encounter for palliative care: Secondary | ICD-10-CM | POA: Diagnosis not present

## 2022-03-11 DIAGNOSIS — F329 Major depressive disorder, single episode, unspecified: Secondary | ICD-10-CM | POA: Diagnosis not present

## 2022-03-12 DIAGNOSIS — G311 Senile degeneration of brain, not elsewhere classified: Secondary | ICD-10-CM | POA: Diagnosis not present

## 2022-03-12 DIAGNOSIS — G44319 Acute post-traumatic headache, not intractable: Secondary | ICD-10-CM | POA: Diagnosis not present

## 2022-03-12 DIAGNOSIS — I509 Heart failure, unspecified: Secondary | ICD-10-CM | POA: Diagnosis not present

## 2022-03-12 DIAGNOSIS — I10 Essential (primary) hypertension: Secondary | ICD-10-CM | POA: Diagnosis not present

## 2022-03-12 DIAGNOSIS — M542 Cervicalgia: Secondary | ICD-10-CM | POA: Diagnosis not present

## 2022-03-12 DIAGNOSIS — Z87820 Personal history of traumatic brain injury: Secondary | ICD-10-CM | POA: Diagnosis not present

## 2022-03-15 DIAGNOSIS — I10 Essential (primary) hypertension: Secondary | ICD-10-CM | POA: Diagnosis not present

## 2022-03-15 DIAGNOSIS — Z87820 Personal history of traumatic brain injury: Secondary | ICD-10-CM | POA: Diagnosis not present

## 2022-03-15 DIAGNOSIS — G44319 Acute post-traumatic headache, not intractable: Secondary | ICD-10-CM | POA: Diagnosis not present

## 2022-03-15 DIAGNOSIS — M542 Cervicalgia: Secondary | ICD-10-CM | POA: Diagnosis not present

## 2022-03-15 DIAGNOSIS — G311 Senile degeneration of brain, not elsewhere classified: Secondary | ICD-10-CM | POA: Diagnosis not present

## 2022-03-15 DIAGNOSIS — I509 Heart failure, unspecified: Secondary | ICD-10-CM | POA: Diagnosis not present

## 2022-03-16 NOTE — Progress Notes (Signed)
Remote pacemaker transmission.   

## 2022-03-17 DIAGNOSIS — I10 Essential (primary) hypertension: Secondary | ICD-10-CM | POA: Diagnosis not present

## 2022-03-17 DIAGNOSIS — Z87820 Personal history of traumatic brain injury: Secondary | ICD-10-CM | POA: Diagnosis not present

## 2022-03-17 DIAGNOSIS — I509 Heart failure, unspecified: Secondary | ICD-10-CM | POA: Diagnosis not present

## 2022-03-17 DIAGNOSIS — G311 Senile degeneration of brain, not elsewhere classified: Secondary | ICD-10-CM | POA: Diagnosis not present

## 2022-03-17 DIAGNOSIS — G44319 Acute post-traumatic headache, not intractable: Secondary | ICD-10-CM | POA: Diagnosis not present

## 2022-03-17 DIAGNOSIS — M542 Cervicalgia: Secondary | ICD-10-CM | POA: Diagnosis not present

## 2022-03-18 DIAGNOSIS — M542 Cervicalgia: Secondary | ICD-10-CM | POA: Diagnosis not present

## 2022-03-18 DIAGNOSIS — Z87820 Personal history of traumatic brain injury: Secondary | ICD-10-CM | POA: Diagnosis not present

## 2022-03-18 DIAGNOSIS — G311 Senile degeneration of brain, not elsewhere classified: Secondary | ICD-10-CM | POA: Diagnosis not present

## 2022-03-18 DIAGNOSIS — G44319 Acute post-traumatic headache, not intractable: Secondary | ICD-10-CM | POA: Diagnosis not present

## 2022-03-18 DIAGNOSIS — I509 Heart failure, unspecified: Secondary | ICD-10-CM | POA: Diagnosis not present

## 2022-03-18 DIAGNOSIS — I10 Essential (primary) hypertension: Secondary | ICD-10-CM | POA: Diagnosis not present

## 2022-03-19 DIAGNOSIS — I10 Essential (primary) hypertension: Secondary | ICD-10-CM | POA: Diagnosis not present

## 2022-03-19 DIAGNOSIS — Z87820 Personal history of traumatic brain injury: Secondary | ICD-10-CM | POA: Diagnosis not present

## 2022-03-19 DIAGNOSIS — M542 Cervicalgia: Secondary | ICD-10-CM | POA: Diagnosis not present

## 2022-03-19 DIAGNOSIS — G44319 Acute post-traumatic headache, not intractable: Secondary | ICD-10-CM | POA: Diagnosis not present

## 2022-03-19 DIAGNOSIS — I509 Heart failure, unspecified: Secondary | ICD-10-CM | POA: Diagnosis not present

## 2022-03-19 DIAGNOSIS — G311 Senile degeneration of brain, not elsewhere classified: Secondary | ICD-10-CM | POA: Diagnosis not present

## 2022-03-22 DIAGNOSIS — G44319 Acute post-traumatic headache, not intractable: Secondary | ICD-10-CM | POA: Diagnosis not present

## 2022-03-22 DIAGNOSIS — Z87820 Personal history of traumatic brain injury: Secondary | ICD-10-CM | POA: Diagnosis not present

## 2022-03-22 DIAGNOSIS — I10 Essential (primary) hypertension: Secondary | ICD-10-CM | POA: Diagnosis not present

## 2022-03-22 DIAGNOSIS — G311 Senile degeneration of brain, not elsewhere classified: Secondary | ICD-10-CM | POA: Diagnosis not present

## 2022-03-22 DIAGNOSIS — I509 Heart failure, unspecified: Secondary | ICD-10-CM | POA: Diagnosis not present

## 2022-03-22 DIAGNOSIS — M542 Cervicalgia: Secondary | ICD-10-CM | POA: Diagnosis not present

## 2022-03-25 DIAGNOSIS — I10 Essential (primary) hypertension: Secondary | ICD-10-CM | POA: Diagnosis not present

## 2022-03-25 DIAGNOSIS — G311 Senile degeneration of brain, not elsewhere classified: Secondary | ICD-10-CM | POA: Diagnosis not present

## 2022-03-25 DIAGNOSIS — M542 Cervicalgia: Secondary | ICD-10-CM | POA: Diagnosis not present

## 2022-03-25 DIAGNOSIS — G44319 Acute post-traumatic headache, not intractable: Secondary | ICD-10-CM | POA: Diagnosis not present

## 2022-03-25 DIAGNOSIS — Z87820 Personal history of traumatic brain injury: Secondary | ICD-10-CM | POA: Diagnosis not present

## 2022-03-25 DIAGNOSIS — I509 Heart failure, unspecified: Secondary | ICD-10-CM | POA: Diagnosis not present

## 2022-03-26 DIAGNOSIS — G44319 Acute post-traumatic headache, not intractable: Secondary | ICD-10-CM | POA: Diagnosis not present

## 2022-03-26 DIAGNOSIS — M542 Cervicalgia: Secondary | ICD-10-CM | POA: Diagnosis not present

## 2022-03-26 DIAGNOSIS — I10 Essential (primary) hypertension: Secondary | ICD-10-CM | POA: Diagnosis not present

## 2022-03-26 DIAGNOSIS — I509 Heart failure, unspecified: Secondary | ICD-10-CM | POA: Diagnosis not present

## 2022-03-26 DIAGNOSIS — G311 Senile degeneration of brain, not elsewhere classified: Secondary | ICD-10-CM | POA: Diagnosis not present

## 2022-03-26 DIAGNOSIS — Z87820 Personal history of traumatic brain injury: Secondary | ICD-10-CM | POA: Diagnosis not present

## 2022-03-29 DIAGNOSIS — Z87820 Personal history of traumatic brain injury: Secondary | ICD-10-CM | POA: Diagnosis not present

## 2022-03-29 DIAGNOSIS — M542 Cervicalgia: Secondary | ICD-10-CM | POA: Diagnosis not present

## 2022-03-29 DIAGNOSIS — G311 Senile degeneration of brain, not elsewhere classified: Secondary | ICD-10-CM | POA: Diagnosis not present

## 2022-03-29 DIAGNOSIS — I509 Heart failure, unspecified: Secondary | ICD-10-CM | POA: Diagnosis not present

## 2022-03-29 DIAGNOSIS — G44319 Acute post-traumatic headache, not intractable: Secondary | ICD-10-CM | POA: Diagnosis not present

## 2022-03-29 DIAGNOSIS — I10 Essential (primary) hypertension: Secondary | ICD-10-CM | POA: Diagnosis not present

## 2022-03-31 DIAGNOSIS — G311 Senile degeneration of brain, not elsewhere classified: Secondary | ICD-10-CM | POA: Diagnosis not present

## 2022-03-31 DIAGNOSIS — M542 Cervicalgia: Secondary | ICD-10-CM | POA: Diagnosis not present

## 2022-03-31 DIAGNOSIS — Z87820 Personal history of traumatic brain injury: Secondary | ICD-10-CM | POA: Diagnosis not present

## 2022-03-31 DIAGNOSIS — G44319 Acute post-traumatic headache, not intractable: Secondary | ICD-10-CM | POA: Diagnosis not present

## 2022-03-31 DIAGNOSIS — I509 Heart failure, unspecified: Secondary | ICD-10-CM | POA: Diagnosis not present

## 2022-03-31 DIAGNOSIS — I10 Essential (primary) hypertension: Secondary | ICD-10-CM | POA: Diagnosis not present

## 2022-04-02 DIAGNOSIS — G44319 Acute post-traumatic headache, not intractable: Secondary | ICD-10-CM | POA: Diagnosis not present

## 2022-04-02 DIAGNOSIS — Z87820 Personal history of traumatic brain injury: Secondary | ICD-10-CM | POA: Diagnosis not present

## 2022-04-02 DIAGNOSIS — G311 Senile degeneration of brain, not elsewhere classified: Secondary | ICD-10-CM | POA: Diagnosis not present

## 2022-04-02 DIAGNOSIS — I509 Heart failure, unspecified: Secondary | ICD-10-CM | POA: Diagnosis not present

## 2022-04-02 DIAGNOSIS — I10 Essential (primary) hypertension: Secondary | ICD-10-CM | POA: Diagnosis not present

## 2022-04-02 DIAGNOSIS — M542 Cervicalgia: Secondary | ICD-10-CM | POA: Diagnosis not present

## 2022-04-05 DIAGNOSIS — G311 Senile degeneration of brain, not elsewhere classified: Secondary | ICD-10-CM | POA: Diagnosis not present

## 2022-04-05 DIAGNOSIS — G44319 Acute post-traumatic headache, not intractable: Secondary | ICD-10-CM | POA: Diagnosis not present

## 2022-04-05 DIAGNOSIS — I10 Essential (primary) hypertension: Secondary | ICD-10-CM | POA: Diagnosis not present

## 2022-04-05 DIAGNOSIS — I509 Heart failure, unspecified: Secondary | ICD-10-CM | POA: Diagnosis not present

## 2022-04-05 DIAGNOSIS — M542 Cervicalgia: Secondary | ICD-10-CM | POA: Diagnosis not present

## 2022-04-05 DIAGNOSIS — Z87820 Personal history of traumatic brain injury: Secondary | ICD-10-CM | POA: Diagnosis not present

## 2022-04-06 DIAGNOSIS — I509 Heart failure, unspecified: Secondary | ICD-10-CM | POA: Diagnosis not present

## 2022-04-06 DIAGNOSIS — R1310 Dysphagia, unspecified: Secondary | ICD-10-CM | POA: Diagnosis not present

## 2022-04-06 DIAGNOSIS — G311 Senile degeneration of brain, not elsewhere classified: Secondary | ICD-10-CM | POA: Diagnosis not present

## 2022-04-06 DIAGNOSIS — N1832 Chronic kidney disease, stage 3b: Secondary | ICD-10-CM | POA: Diagnosis not present

## 2022-04-06 DIAGNOSIS — F0283 Dementia in other diseases classified elsewhere, unspecified severity, with mood disturbance: Secondary | ICD-10-CM | POA: Diagnosis not present

## 2022-04-06 DIAGNOSIS — M1990 Unspecified osteoarthritis, unspecified site: Secondary | ICD-10-CM | POA: Diagnosis not present

## 2022-04-06 DIAGNOSIS — I25119 Atherosclerotic heart disease of native coronary artery with unspecified angina pectoris: Secondary | ICD-10-CM | POA: Diagnosis not present

## 2022-04-06 DIAGNOSIS — I4891 Unspecified atrial fibrillation: Secondary | ICD-10-CM | POA: Diagnosis not present

## 2022-04-06 DIAGNOSIS — E785 Hyperlipidemia, unspecified: Secondary | ICD-10-CM | POA: Diagnosis not present

## 2022-04-06 DIAGNOSIS — M810 Age-related osteoporosis without current pathological fracture: Secondary | ICD-10-CM | POA: Diagnosis not present

## 2022-04-06 DIAGNOSIS — K219 Gastro-esophageal reflux disease without esophagitis: Secondary | ICD-10-CM | POA: Diagnosis not present

## 2022-04-06 DIAGNOSIS — D631 Anemia in chronic kidney disease: Secondary | ICD-10-CM | POA: Diagnosis not present

## 2022-04-06 DIAGNOSIS — I130 Hypertensive heart and chronic kidney disease with heart failure and stage 1 through stage 4 chronic kidney disease, or unspecified chronic kidney disease: Secondary | ICD-10-CM | POA: Diagnosis not present

## 2022-04-06 DIAGNOSIS — J449 Chronic obstructive pulmonary disease, unspecified: Secondary | ICD-10-CM | POA: Diagnosis not present

## 2022-04-06 DIAGNOSIS — M109 Gout, unspecified: Secondary | ICD-10-CM | POA: Diagnosis not present

## 2022-04-06 DIAGNOSIS — S129XXD Fracture of neck, unspecified, subsequent encounter: Secondary | ICD-10-CM | POA: Diagnosis not present

## 2022-04-06 DIAGNOSIS — R519 Headache, unspecified: Secondary | ICD-10-CM | POA: Diagnosis not present

## 2022-04-06 DIAGNOSIS — I272 Pulmonary hypertension, unspecified: Secondary | ICD-10-CM | POA: Diagnosis not present

## 2022-04-07 DIAGNOSIS — G311 Senile degeneration of brain, not elsewhere classified: Secondary | ICD-10-CM | POA: Diagnosis not present

## 2022-04-07 DIAGNOSIS — F0283 Dementia in other diseases classified elsewhere, unspecified severity, with mood disturbance: Secondary | ICD-10-CM | POA: Diagnosis not present

## 2022-04-07 DIAGNOSIS — I130 Hypertensive heart and chronic kidney disease with heart failure and stage 1 through stage 4 chronic kidney disease, or unspecified chronic kidney disease: Secondary | ICD-10-CM | POA: Diagnosis not present

## 2022-04-07 DIAGNOSIS — S129XXD Fracture of neck, unspecified, subsequent encounter: Secondary | ICD-10-CM | POA: Diagnosis not present

## 2022-04-07 DIAGNOSIS — R1310 Dysphagia, unspecified: Secondary | ICD-10-CM | POA: Diagnosis not present

## 2022-04-07 DIAGNOSIS — I25119 Atherosclerotic heart disease of native coronary artery with unspecified angina pectoris: Secondary | ICD-10-CM | POA: Diagnosis not present

## 2022-04-08 DIAGNOSIS — I25119 Atherosclerotic heart disease of native coronary artery with unspecified angina pectoris: Secondary | ICD-10-CM | POA: Diagnosis not present

## 2022-04-08 DIAGNOSIS — R1310 Dysphagia, unspecified: Secondary | ICD-10-CM | POA: Diagnosis not present

## 2022-04-08 DIAGNOSIS — F0283 Dementia in other diseases classified elsewhere, unspecified severity, with mood disturbance: Secondary | ICD-10-CM | POA: Diagnosis not present

## 2022-04-08 DIAGNOSIS — S129XXD Fracture of neck, unspecified, subsequent encounter: Secondary | ICD-10-CM | POA: Diagnosis not present

## 2022-04-08 DIAGNOSIS — I130 Hypertensive heart and chronic kidney disease with heart failure and stage 1 through stage 4 chronic kidney disease, or unspecified chronic kidney disease: Secondary | ICD-10-CM | POA: Diagnosis not present

## 2022-04-08 DIAGNOSIS — G311 Senile degeneration of brain, not elsewhere classified: Secondary | ICD-10-CM | POA: Diagnosis not present

## 2022-04-09 DIAGNOSIS — R519 Headache, unspecified: Secondary | ICD-10-CM | POA: Diagnosis not present

## 2022-04-09 DIAGNOSIS — I130 Hypertensive heart and chronic kidney disease with heart failure and stage 1 through stage 4 chronic kidney disease, or unspecified chronic kidney disease: Secondary | ICD-10-CM | POA: Diagnosis not present

## 2022-04-09 DIAGNOSIS — I4891 Unspecified atrial fibrillation: Secondary | ICD-10-CM | POA: Diagnosis not present

## 2022-04-09 DIAGNOSIS — M810 Age-related osteoporosis without current pathological fracture: Secondary | ICD-10-CM | POA: Diagnosis not present

## 2022-04-09 DIAGNOSIS — I509 Heart failure, unspecified: Secondary | ICD-10-CM | POA: Diagnosis not present

## 2022-04-09 DIAGNOSIS — S129XXD Fracture of neck, unspecified, subsequent encounter: Secondary | ICD-10-CM | POA: Diagnosis not present

## 2022-04-09 DIAGNOSIS — J449 Chronic obstructive pulmonary disease, unspecified: Secondary | ICD-10-CM | POA: Diagnosis not present

## 2022-04-09 DIAGNOSIS — I272 Pulmonary hypertension, unspecified: Secondary | ICD-10-CM | POA: Diagnosis not present

## 2022-04-09 DIAGNOSIS — R1310 Dysphagia, unspecified: Secondary | ICD-10-CM | POA: Diagnosis not present

## 2022-04-09 DIAGNOSIS — K219 Gastro-esophageal reflux disease without esophagitis: Secondary | ICD-10-CM | POA: Diagnosis not present

## 2022-04-09 DIAGNOSIS — I25119 Atherosclerotic heart disease of native coronary artery with unspecified angina pectoris: Secondary | ICD-10-CM | POA: Diagnosis not present

## 2022-04-09 DIAGNOSIS — F0283 Dementia in other diseases classified elsewhere, unspecified severity, with mood disturbance: Secondary | ICD-10-CM | POA: Diagnosis not present

## 2022-04-09 DIAGNOSIS — M109 Gout, unspecified: Secondary | ICD-10-CM | POA: Diagnosis not present

## 2022-04-09 DIAGNOSIS — E785 Hyperlipidemia, unspecified: Secondary | ICD-10-CM | POA: Diagnosis not present

## 2022-04-09 DIAGNOSIS — G311 Senile degeneration of brain, not elsewhere classified: Secondary | ICD-10-CM | POA: Diagnosis not present

## 2022-04-09 DIAGNOSIS — N1832 Chronic kidney disease, stage 3b: Secondary | ICD-10-CM | POA: Diagnosis not present

## 2022-04-09 DIAGNOSIS — M1990 Unspecified osteoarthritis, unspecified site: Secondary | ICD-10-CM | POA: Diagnosis not present

## 2022-04-09 DIAGNOSIS — D631 Anemia in chronic kidney disease: Secondary | ICD-10-CM | POA: Diagnosis not present

## 2022-04-13 DIAGNOSIS — S129XXD Fracture of neck, unspecified, subsequent encounter: Secondary | ICD-10-CM | POA: Diagnosis not present

## 2022-04-13 DIAGNOSIS — R1310 Dysphagia, unspecified: Secondary | ICD-10-CM | POA: Diagnosis not present

## 2022-04-13 DIAGNOSIS — G311 Senile degeneration of brain, not elsewhere classified: Secondary | ICD-10-CM | POA: Diagnosis not present

## 2022-04-13 DIAGNOSIS — I130 Hypertensive heart and chronic kidney disease with heart failure and stage 1 through stage 4 chronic kidney disease, or unspecified chronic kidney disease: Secondary | ICD-10-CM | POA: Diagnosis not present

## 2022-04-13 DIAGNOSIS — F0283 Dementia in other diseases classified elsewhere, unspecified severity, with mood disturbance: Secondary | ICD-10-CM | POA: Diagnosis not present

## 2022-04-13 DIAGNOSIS — I25119 Atherosclerotic heart disease of native coronary artery with unspecified angina pectoris: Secondary | ICD-10-CM | POA: Diagnosis not present

## 2022-04-15 DIAGNOSIS — I130 Hypertensive heart and chronic kidney disease with heart failure and stage 1 through stage 4 chronic kidney disease, or unspecified chronic kidney disease: Secondary | ICD-10-CM | POA: Diagnosis not present

## 2022-04-15 DIAGNOSIS — S129XXD Fracture of neck, unspecified, subsequent encounter: Secondary | ICD-10-CM | POA: Diagnosis not present

## 2022-04-15 DIAGNOSIS — I25119 Atherosclerotic heart disease of native coronary artery with unspecified angina pectoris: Secondary | ICD-10-CM | POA: Diagnosis not present

## 2022-04-15 DIAGNOSIS — R1310 Dysphagia, unspecified: Secondary | ICD-10-CM | POA: Diagnosis not present

## 2022-04-15 DIAGNOSIS — F0283 Dementia in other diseases classified elsewhere, unspecified severity, with mood disturbance: Secondary | ICD-10-CM | POA: Diagnosis not present

## 2022-04-15 DIAGNOSIS — G311 Senile degeneration of brain, not elsewhere classified: Secondary | ICD-10-CM | POA: Diagnosis not present

## 2022-04-20 DIAGNOSIS — I25119 Atherosclerotic heart disease of native coronary artery with unspecified angina pectoris: Secondary | ICD-10-CM | POA: Diagnosis not present

## 2022-04-20 DIAGNOSIS — G311 Senile degeneration of brain, not elsewhere classified: Secondary | ICD-10-CM | POA: Diagnosis not present

## 2022-04-20 DIAGNOSIS — R1310 Dysphagia, unspecified: Secondary | ICD-10-CM | POA: Diagnosis not present

## 2022-04-20 DIAGNOSIS — S129XXD Fracture of neck, unspecified, subsequent encounter: Secondary | ICD-10-CM | POA: Diagnosis not present

## 2022-04-20 DIAGNOSIS — F0283 Dementia in other diseases classified elsewhere, unspecified severity, with mood disturbance: Secondary | ICD-10-CM | POA: Diagnosis not present

## 2022-04-20 DIAGNOSIS — I130 Hypertensive heart and chronic kidney disease with heart failure and stage 1 through stage 4 chronic kidney disease, or unspecified chronic kidney disease: Secondary | ICD-10-CM | POA: Diagnosis not present

## 2022-04-21 DIAGNOSIS — G311 Senile degeneration of brain, not elsewhere classified: Secondary | ICD-10-CM | POA: Diagnosis not present

## 2022-04-21 DIAGNOSIS — R1310 Dysphagia, unspecified: Secondary | ICD-10-CM | POA: Diagnosis not present

## 2022-04-21 DIAGNOSIS — S129XXD Fracture of neck, unspecified, subsequent encounter: Secondary | ICD-10-CM | POA: Diagnosis not present

## 2022-04-21 DIAGNOSIS — I25119 Atherosclerotic heart disease of native coronary artery with unspecified angina pectoris: Secondary | ICD-10-CM | POA: Diagnosis not present

## 2022-04-21 DIAGNOSIS — F0283 Dementia in other diseases classified elsewhere, unspecified severity, with mood disturbance: Secondary | ICD-10-CM | POA: Diagnosis not present

## 2022-04-21 DIAGNOSIS — I130 Hypertensive heart and chronic kidney disease with heart failure and stage 1 through stage 4 chronic kidney disease, or unspecified chronic kidney disease: Secondary | ICD-10-CM | POA: Diagnosis not present

## 2022-04-22 DIAGNOSIS — G311 Senile degeneration of brain, not elsewhere classified: Secondary | ICD-10-CM | POA: Diagnosis not present

## 2022-04-22 DIAGNOSIS — R1310 Dysphagia, unspecified: Secondary | ICD-10-CM | POA: Diagnosis not present

## 2022-04-22 DIAGNOSIS — I25119 Atherosclerotic heart disease of native coronary artery with unspecified angina pectoris: Secondary | ICD-10-CM | POA: Diagnosis not present

## 2022-04-22 DIAGNOSIS — I130 Hypertensive heart and chronic kidney disease with heart failure and stage 1 through stage 4 chronic kidney disease, or unspecified chronic kidney disease: Secondary | ICD-10-CM | POA: Diagnosis not present

## 2022-04-22 DIAGNOSIS — S129XXD Fracture of neck, unspecified, subsequent encounter: Secondary | ICD-10-CM | POA: Diagnosis not present

## 2022-04-22 DIAGNOSIS — F0283 Dementia in other diseases classified elsewhere, unspecified severity, with mood disturbance: Secondary | ICD-10-CM | POA: Diagnosis not present

## 2022-04-23 DIAGNOSIS — S129XXD Fracture of neck, unspecified, subsequent encounter: Secondary | ICD-10-CM | POA: Diagnosis not present

## 2022-04-23 DIAGNOSIS — I130 Hypertensive heart and chronic kidney disease with heart failure and stage 1 through stage 4 chronic kidney disease, or unspecified chronic kidney disease: Secondary | ICD-10-CM | POA: Diagnosis not present

## 2022-04-23 DIAGNOSIS — G311 Senile degeneration of brain, not elsewhere classified: Secondary | ICD-10-CM | POA: Diagnosis not present

## 2022-04-23 DIAGNOSIS — F0283 Dementia in other diseases classified elsewhere, unspecified severity, with mood disturbance: Secondary | ICD-10-CM | POA: Diagnosis not present

## 2022-04-23 DIAGNOSIS — I25119 Atherosclerotic heart disease of native coronary artery with unspecified angina pectoris: Secondary | ICD-10-CM | POA: Diagnosis not present

## 2022-04-23 DIAGNOSIS — R1310 Dysphagia, unspecified: Secondary | ICD-10-CM | POA: Diagnosis not present

## 2022-04-27 DIAGNOSIS — F0283 Dementia in other diseases classified elsewhere, unspecified severity, with mood disturbance: Secondary | ICD-10-CM | POA: Diagnosis not present

## 2022-04-27 DIAGNOSIS — I130 Hypertensive heart and chronic kidney disease with heart failure and stage 1 through stage 4 chronic kidney disease, or unspecified chronic kidney disease: Secondary | ICD-10-CM | POA: Diagnosis not present

## 2022-04-27 DIAGNOSIS — I25119 Atherosclerotic heart disease of native coronary artery with unspecified angina pectoris: Secondary | ICD-10-CM | POA: Diagnosis not present

## 2022-04-27 DIAGNOSIS — R1310 Dysphagia, unspecified: Secondary | ICD-10-CM | POA: Diagnosis not present

## 2022-04-27 DIAGNOSIS — S129XXD Fracture of neck, unspecified, subsequent encounter: Secondary | ICD-10-CM | POA: Diagnosis not present

## 2022-04-27 DIAGNOSIS — G311 Senile degeneration of brain, not elsewhere classified: Secondary | ICD-10-CM | POA: Diagnosis not present

## 2022-04-28 DIAGNOSIS — I25119 Atherosclerotic heart disease of native coronary artery with unspecified angina pectoris: Secondary | ICD-10-CM | POA: Diagnosis not present

## 2022-04-28 DIAGNOSIS — I130 Hypertensive heart and chronic kidney disease with heart failure and stage 1 through stage 4 chronic kidney disease, or unspecified chronic kidney disease: Secondary | ICD-10-CM | POA: Diagnosis not present

## 2022-04-28 DIAGNOSIS — R1310 Dysphagia, unspecified: Secondary | ICD-10-CM | POA: Diagnosis not present

## 2022-04-28 DIAGNOSIS — F0283 Dementia in other diseases classified elsewhere, unspecified severity, with mood disturbance: Secondary | ICD-10-CM | POA: Diagnosis not present

## 2022-04-28 DIAGNOSIS — G311 Senile degeneration of brain, not elsewhere classified: Secondary | ICD-10-CM | POA: Diagnosis not present

## 2022-04-28 DIAGNOSIS — S129XXD Fracture of neck, unspecified, subsequent encounter: Secondary | ICD-10-CM | POA: Diagnosis not present

## 2022-04-29 DIAGNOSIS — R1310 Dysphagia, unspecified: Secondary | ICD-10-CM | POA: Diagnosis not present

## 2022-04-29 DIAGNOSIS — S129XXD Fracture of neck, unspecified, subsequent encounter: Secondary | ICD-10-CM | POA: Diagnosis not present

## 2022-04-29 DIAGNOSIS — F0283 Dementia in other diseases classified elsewhere, unspecified severity, with mood disturbance: Secondary | ICD-10-CM | POA: Diagnosis not present

## 2022-04-29 DIAGNOSIS — I25119 Atherosclerotic heart disease of native coronary artery with unspecified angina pectoris: Secondary | ICD-10-CM | POA: Diagnosis not present

## 2022-04-29 DIAGNOSIS — G311 Senile degeneration of brain, not elsewhere classified: Secondary | ICD-10-CM | POA: Diagnosis not present

## 2022-04-29 DIAGNOSIS — I130 Hypertensive heart and chronic kidney disease with heart failure and stage 1 through stage 4 chronic kidney disease, or unspecified chronic kidney disease: Secondary | ICD-10-CM | POA: Diagnosis not present

## 2022-05-04 DIAGNOSIS — G311 Senile degeneration of brain, not elsewhere classified: Secondary | ICD-10-CM | POA: Diagnosis not present

## 2022-05-04 DIAGNOSIS — R1310 Dysphagia, unspecified: Secondary | ICD-10-CM | POA: Diagnosis not present

## 2022-05-04 DIAGNOSIS — S129XXD Fracture of neck, unspecified, subsequent encounter: Secondary | ICD-10-CM | POA: Diagnosis not present

## 2022-05-04 DIAGNOSIS — I25119 Atherosclerotic heart disease of native coronary artery with unspecified angina pectoris: Secondary | ICD-10-CM | POA: Diagnosis not present

## 2022-05-04 DIAGNOSIS — I130 Hypertensive heart and chronic kidney disease with heart failure and stage 1 through stage 4 chronic kidney disease, or unspecified chronic kidney disease: Secondary | ICD-10-CM | POA: Diagnosis not present

## 2022-05-04 DIAGNOSIS — F0283 Dementia in other diseases classified elsewhere, unspecified severity, with mood disturbance: Secondary | ICD-10-CM | POA: Diagnosis not present

## 2022-05-05 DIAGNOSIS — S129XXD Fracture of neck, unspecified, subsequent encounter: Secondary | ICD-10-CM | POA: Diagnosis not present

## 2022-05-05 DIAGNOSIS — G311 Senile degeneration of brain, not elsewhere classified: Secondary | ICD-10-CM | POA: Diagnosis not present

## 2022-05-05 DIAGNOSIS — I25119 Atherosclerotic heart disease of native coronary artery with unspecified angina pectoris: Secondary | ICD-10-CM | POA: Diagnosis not present

## 2022-05-05 DIAGNOSIS — I130 Hypertensive heart and chronic kidney disease with heart failure and stage 1 through stage 4 chronic kidney disease, or unspecified chronic kidney disease: Secondary | ICD-10-CM | POA: Diagnosis not present

## 2022-05-05 DIAGNOSIS — R1310 Dysphagia, unspecified: Secondary | ICD-10-CM | POA: Diagnosis not present

## 2022-05-05 DIAGNOSIS — F0283 Dementia in other diseases classified elsewhere, unspecified severity, with mood disturbance: Secondary | ICD-10-CM | POA: Diagnosis not present

## 2022-05-06 DIAGNOSIS — I130 Hypertensive heart and chronic kidney disease with heart failure and stage 1 through stage 4 chronic kidney disease, or unspecified chronic kidney disease: Secondary | ICD-10-CM | POA: Diagnosis not present

## 2022-05-06 DIAGNOSIS — F0283 Dementia in other diseases classified elsewhere, unspecified severity, with mood disturbance: Secondary | ICD-10-CM | POA: Diagnosis not present

## 2022-05-06 DIAGNOSIS — I25119 Atherosclerotic heart disease of native coronary artery with unspecified angina pectoris: Secondary | ICD-10-CM | POA: Diagnosis not present

## 2022-05-06 DIAGNOSIS — S129XXD Fracture of neck, unspecified, subsequent encounter: Secondary | ICD-10-CM | POA: Diagnosis not present

## 2022-05-06 DIAGNOSIS — R1310 Dysphagia, unspecified: Secondary | ICD-10-CM | POA: Diagnosis not present

## 2022-05-06 DIAGNOSIS — G311 Senile degeneration of brain, not elsewhere classified: Secondary | ICD-10-CM | POA: Diagnosis not present

## 2022-05-10 ENCOUNTER — Ambulatory Visit: Payer: Medicare Other | Attending: *Deleted

## 2022-05-10 DIAGNOSIS — K219 Gastro-esophageal reflux disease without esophagitis: Secondary | ICD-10-CM | POA: Diagnosis not present

## 2022-05-10 DIAGNOSIS — F0283 Dementia in other diseases classified elsewhere, unspecified severity, with mood disturbance: Secondary | ICD-10-CM | POA: Diagnosis not present

## 2022-05-10 DIAGNOSIS — M810 Age-related osteoporosis without current pathological fracture: Secondary | ICD-10-CM | POA: Diagnosis not present

## 2022-05-10 DIAGNOSIS — I272 Pulmonary hypertension, unspecified: Secondary | ICD-10-CM | POA: Diagnosis not present

## 2022-05-10 DIAGNOSIS — M1990 Unspecified osteoarthritis, unspecified site: Secondary | ICD-10-CM | POA: Diagnosis not present

## 2022-05-10 DIAGNOSIS — I4891 Unspecified atrial fibrillation: Secondary | ICD-10-CM | POA: Diagnosis not present

## 2022-05-10 DIAGNOSIS — I25119 Atherosclerotic heart disease of native coronary artery with unspecified angina pectoris: Secondary | ICD-10-CM | POA: Diagnosis not present

## 2022-05-10 DIAGNOSIS — J449 Chronic obstructive pulmonary disease, unspecified: Secondary | ICD-10-CM | POA: Diagnosis not present

## 2022-05-10 DIAGNOSIS — R1310 Dysphagia, unspecified: Secondary | ICD-10-CM | POA: Diagnosis not present

## 2022-05-10 DIAGNOSIS — N1832 Chronic kidney disease, stage 3b: Secondary | ICD-10-CM | POA: Diagnosis not present

## 2022-05-10 DIAGNOSIS — M109 Gout, unspecified: Secondary | ICD-10-CM | POA: Diagnosis not present

## 2022-05-10 DIAGNOSIS — R32 Unspecified urinary incontinence: Secondary | ICD-10-CM | POA: Diagnosis not present

## 2022-05-10 DIAGNOSIS — R519 Headache, unspecified: Secondary | ICD-10-CM | POA: Diagnosis not present

## 2022-05-10 DIAGNOSIS — E785 Hyperlipidemia, unspecified: Secondary | ICD-10-CM | POA: Diagnosis not present

## 2022-05-10 DIAGNOSIS — S129XXD Fracture of neck, unspecified, subsequent encounter: Secondary | ICD-10-CM | POA: Diagnosis not present

## 2022-05-10 DIAGNOSIS — D631 Anemia in chronic kidney disease: Secondary | ICD-10-CM | POA: Diagnosis not present

## 2022-05-10 DIAGNOSIS — G311 Senile degeneration of brain, not elsewhere classified: Secondary | ICD-10-CM | POA: Diagnosis not present

## 2022-05-10 DIAGNOSIS — I509 Heart failure, unspecified: Secondary | ICD-10-CM | POA: Diagnosis not present

## 2022-05-10 DIAGNOSIS — I130 Hypertensive heart and chronic kidney disease with heart failure and stage 1 through stage 4 chronic kidney disease, or unspecified chronic kidney disease: Secondary | ICD-10-CM | POA: Diagnosis not present

## 2022-05-11 DIAGNOSIS — F0283 Dementia in other diseases classified elsewhere, unspecified severity, with mood disturbance: Secondary | ICD-10-CM | POA: Diagnosis not present

## 2022-05-11 DIAGNOSIS — I130 Hypertensive heart and chronic kidney disease with heart failure and stage 1 through stage 4 chronic kidney disease, or unspecified chronic kidney disease: Secondary | ICD-10-CM | POA: Diagnosis not present

## 2022-05-11 DIAGNOSIS — R1310 Dysphagia, unspecified: Secondary | ICD-10-CM | POA: Diagnosis not present

## 2022-05-11 DIAGNOSIS — I25119 Atherosclerotic heart disease of native coronary artery with unspecified angina pectoris: Secondary | ICD-10-CM | POA: Diagnosis not present

## 2022-05-11 DIAGNOSIS — S129XXD Fracture of neck, unspecified, subsequent encounter: Secondary | ICD-10-CM | POA: Diagnosis not present

## 2022-05-11 DIAGNOSIS — G311 Senile degeneration of brain, not elsewhere classified: Secondary | ICD-10-CM | POA: Diagnosis not present

## 2022-05-12 DIAGNOSIS — R1310 Dysphagia, unspecified: Secondary | ICD-10-CM | POA: Diagnosis not present

## 2022-05-12 DIAGNOSIS — F0283 Dementia in other diseases classified elsewhere, unspecified severity, with mood disturbance: Secondary | ICD-10-CM | POA: Diagnosis not present

## 2022-05-12 DIAGNOSIS — G311 Senile degeneration of brain, not elsewhere classified: Secondary | ICD-10-CM | POA: Diagnosis not present

## 2022-05-12 DIAGNOSIS — I130 Hypertensive heart and chronic kidney disease with heart failure and stage 1 through stage 4 chronic kidney disease, or unspecified chronic kidney disease: Secondary | ICD-10-CM | POA: Diagnosis not present

## 2022-05-12 DIAGNOSIS — S129XXD Fracture of neck, unspecified, subsequent encounter: Secondary | ICD-10-CM | POA: Diagnosis not present

## 2022-05-12 DIAGNOSIS — I25119 Atherosclerotic heart disease of native coronary artery with unspecified angina pectoris: Secondary | ICD-10-CM | POA: Diagnosis not present

## 2022-05-13 DIAGNOSIS — S129XXD Fracture of neck, unspecified, subsequent encounter: Secondary | ICD-10-CM | POA: Diagnosis not present

## 2022-05-13 DIAGNOSIS — G311 Senile degeneration of brain, not elsewhere classified: Secondary | ICD-10-CM | POA: Diagnosis not present

## 2022-05-13 DIAGNOSIS — I130 Hypertensive heart and chronic kidney disease with heart failure and stage 1 through stage 4 chronic kidney disease, or unspecified chronic kidney disease: Secondary | ICD-10-CM | POA: Diagnosis not present

## 2022-05-13 DIAGNOSIS — I25119 Atherosclerotic heart disease of native coronary artery with unspecified angina pectoris: Secondary | ICD-10-CM | POA: Diagnosis not present

## 2022-05-13 DIAGNOSIS — R1310 Dysphagia, unspecified: Secondary | ICD-10-CM | POA: Diagnosis not present

## 2022-05-13 DIAGNOSIS — F0283 Dementia in other diseases classified elsewhere, unspecified severity, with mood disturbance: Secondary | ICD-10-CM | POA: Diagnosis not present

## 2022-05-18 DIAGNOSIS — S129XXD Fracture of neck, unspecified, subsequent encounter: Secondary | ICD-10-CM | POA: Diagnosis not present

## 2022-05-18 DIAGNOSIS — R1310 Dysphagia, unspecified: Secondary | ICD-10-CM | POA: Diagnosis not present

## 2022-05-18 DIAGNOSIS — F0283 Dementia in other diseases classified elsewhere, unspecified severity, with mood disturbance: Secondary | ICD-10-CM | POA: Diagnosis not present

## 2022-05-18 DIAGNOSIS — I130 Hypertensive heart and chronic kidney disease with heart failure and stage 1 through stage 4 chronic kidney disease, or unspecified chronic kidney disease: Secondary | ICD-10-CM | POA: Diagnosis not present

## 2022-05-18 DIAGNOSIS — I25119 Atherosclerotic heart disease of native coronary artery with unspecified angina pectoris: Secondary | ICD-10-CM | POA: Diagnosis not present

## 2022-05-18 DIAGNOSIS — G311 Senile degeneration of brain, not elsewhere classified: Secondary | ICD-10-CM | POA: Diagnosis not present

## 2022-05-20 DIAGNOSIS — S129XXD Fracture of neck, unspecified, subsequent encounter: Secondary | ICD-10-CM | POA: Diagnosis not present

## 2022-05-20 DIAGNOSIS — R1310 Dysphagia, unspecified: Secondary | ICD-10-CM | POA: Diagnosis not present

## 2022-05-20 DIAGNOSIS — I25119 Atherosclerotic heart disease of native coronary artery with unspecified angina pectoris: Secondary | ICD-10-CM | POA: Diagnosis not present

## 2022-05-20 DIAGNOSIS — I130 Hypertensive heart and chronic kidney disease with heart failure and stage 1 through stage 4 chronic kidney disease, or unspecified chronic kidney disease: Secondary | ICD-10-CM | POA: Diagnosis not present

## 2022-05-20 DIAGNOSIS — G311 Senile degeneration of brain, not elsewhere classified: Secondary | ICD-10-CM | POA: Diagnosis not present

## 2022-05-20 DIAGNOSIS — F0283 Dementia in other diseases classified elsewhere, unspecified severity, with mood disturbance: Secondary | ICD-10-CM | POA: Diagnosis not present

## 2022-05-21 DIAGNOSIS — I25119 Atherosclerotic heart disease of native coronary artery with unspecified angina pectoris: Secondary | ICD-10-CM | POA: Diagnosis not present

## 2022-05-21 DIAGNOSIS — G311 Senile degeneration of brain, not elsewhere classified: Secondary | ICD-10-CM | POA: Diagnosis not present

## 2022-05-21 DIAGNOSIS — F0283 Dementia in other diseases classified elsewhere, unspecified severity, with mood disturbance: Secondary | ICD-10-CM | POA: Diagnosis not present

## 2022-05-21 DIAGNOSIS — R1310 Dysphagia, unspecified: Secondary | ICD-10-CM | POA: Diagnosis not present

## 2022-05-21 DIAGNOSIS — I130 Hypertensive heart and chronic kidney disease with heart failure and stage 1 through stage 4 chronic kidney disease, or unspecified chronic kidney disease: Secondary | ICD-10-CM | POA: Diagnosis not present

## 2022-05-21 DIAGNOSIS — S129XXD Fracture of neck, unspecified, subsequent encounter: Secondary | ICD-10-CM | POA: Diagnosis not present

## 2022-05-25 DIAGNOSIS — G311 Senile degeneration of brain, not elsewhere classified: Secondary | ICD-10-CM | POA: Diagnosis not present

## 2022-05-25 DIAGNOSIS — S129XXD Fracture of neck, unspecified, subsequent encounter: Secondary | ICD-10-CM | POA: Diagnosis not present

## 2022-05-25 DIAGNOSIS — I25119 Atherosclerotic heart disease of native coronary artery with unspecified angina pectoris: Secondary | ICD-10-CM | POA: Diagnosis not present

## 2022-05-25 DIAGNOSIS — R1310 Dysphagia, unspecified: Secondary | ICD-10-CM | POA: Diagnosis not present

## 2022-05-25 DIAGNOSIS — I130 Hypertensive heart and chronic kidney disease with heart failure and stage 1 through stage 4 chronic kidney disease, or unspecified chronic kidney disease: Secondary | ICD-10-CM | POA: Diagnosis not present

## 2022-05-25 DIAGNOSIS — F0283 Dementia in other diseases classified elsewhere, unspecified severity, with mood disturbance: Secondary | ICD-10-CM | POA: Diagnosis not present

## 2022-05-27 DIAGNOSIS — I25119 Atherosclerotic heart disease of native coronary artery with unspecified angina pectoris: Secondary | ICD-10-CM | POA: Diagnosis not present

## 2022-05-27 DIAGNOSIS — I130 Hypertensive heart and chronic kidney disease with heart failure and stage 1 through stage 4 chronic kidney disease, or unspecified chronic kidney disease: Secondary | ICD-10-CM | POA: Diagnosis not present

## 2022-05-27 DIAGNOSIS — F0283 Dementia in other diseases classified elsewhere, unspecified severity, with mood disturbance: Secondary | ICD-10-CM | POA: Diagnosis not present

## 2022-05-27 DIAGNOSIS — S129XXD Fracture of neck, unspecified, subsequent encounter: Secondary | ICD-10-CM | POA: Diagnosis not present

## 2022-05-27 DIAGNOSIS — G311 Senile degeneration of brain, not elsewhere classified: Secondary | ICD-10-CM | POA: Diagnosis not present

## 2022-05-27 DIAGNOSIS — R1310 Dysphagia, unspecified: Secondary | ICD-10-CM | POA: Diagnosis not present

## 2022-06-01 DIAGNOSIS — R1310 Dysphagia, unspecified: Secondary | ICD-10-CM | POA: Diagnosis not present

## 2022-06-01 DIAGNOSIS — I25119 Atherosclerotic heart disease of native coronary artery with unspecified angina pectoris: Secondary | ICD-10-CM | POA: Diagnosis not present

## 2022-06-01 DIAGNOSIS — I130 Hypertensive heart and chronic kidney disease with heart failure and stage 1 through stage 4 chronic kidney disease, or unspecified chronic kidney disease: Secondary | ICD-10-CM | POA: Diagnosis not present

## 2022-06-01 DIAGNOSIS — G311 Senile degeneration of brain, not elsewhere classified: Secondary | ICD-10-CM | POA: Diagnosis not present

## 2022-06-01 DIAGNOSIS — S129XXD Fracture of neck, unspecified, subsequent encounter: Secondary | ICD-10-CM | POA: Diagnosis not present

## 2022-06-01 DIAGNOSIS — F0283 Dementia in other diseases classified elsewhere, unspecified severity, with mood disturbance: Secondary | ICD-10-CM | POA: Diagnosis not present

## 2022-06-02 DIAGNOSIS — G311 Senile degeneration of brain, not elsewhere classified: Secondary | ICD-10-CM | POA: Diagnosis not present

## 2022-06-02 DIAGNOSIS — F0283 Dementia in other diseases classified elsewhere, unspecified severity, with mood disturbance: Secondary | ICD-10-CM | POA: Diagnosis not present

## 2022-06-02 DIAGNOSIS — I25119 Atherosclerotic heart disease of native coronary artery with unspecified angina pectoris: Secondary | ICD-10-CM | POA: Diagnosis not present

## 2022-06-02 DIAGNOSIS — R1310 Dysphagia, unspecified: Secondary | ICD-10-CM | POA: Diagnosis not present

## 2022-06-02 DIAGNOSIS — I130 Hypertensive heart and chronic kidney disease with heart failure and stage 1 through stage 4 chronic kidney disease, or unspecified chronic kidney disease: Secondary | ICD-10-CM | POA: Diagnosis not present

## 2022-06-02 DIAGNOSIS — S129XXD Fracture of neck, unspecified, subsequent encounter: Secondary | ICD-10-CM | POA: Diagnosis not present

## 2022-06-03 DIAGNOSIS — G311 Senile degeneration of brain, not elsewhere classified: Secondary | ICD-10-CM | POA: Diagnosis not present

## 2022-06-03 DIAGNOSIS — F0283 Dementia in other diseases classified elsewhere, unspecified severity, with mood disturbance: Secondary | ICD-10-CM | POA: Diagnosis not present

## 2022-06-03 DIAGNOSIS — S129XXD Fracture of neck, unspecified, subsequent encounter: Secondary | ICD-10-CM | POA: Diagnosis not present

## 2022-06-03 DIAGNOSIS — I25119 Atherosclerotic heart disease of native coronary artery with unspecified angina pectoris: Secondary | ICD-10-CM | POA: Diagnosis not present

## 2022-06-03 DIAGNOSIS — I130 Hypertensive heart and chronic kidney disease with heart failure and stage 1 through stage 4 chronic kidney disease, or unspecified chronic kidney disease: Secondary | ICD-10-CM | POA: Diagnosis not present

## 2022-06-03 DIAGNOSIS — R1310 Dysphagia, unspecified: Secondary | ICD-10-CM | POA: Diagnosis not present

## 2022-06-08 DIAGNOSIS — G311 Senile degeneration of brain, not elsewhere classified: Secondary | ICD-10-CM | POA: Diagnosis not present

## 2022-06-08 DIAGNOSIS — R1310 Dysphagia, unspecified: Secondary | ICD-10-CM | POA: Diagnosis not present

## 2022-06-08 DIAGNOSIS — I25119 Atherosclerotic heart disease of native coronary artery with unspecified angina pectoris: Secondary | ICD-10-CM | POA: Diagnosis not present

## 2022-06-08 DIAGNOSIS — S129XXD Fracture of neck, unspecified, subsequent encounter: Secondary | ICD-10-CM | POA: Diagnosis not present

## 2022-06-08 DIAGNOSIS — I130 Hypertensive heart and chronic kidney disease with heart failure and stage 1 through stage 4 chronic kidney disease, or unspecified chronic kidney disease: Secondary | ICD-10-CM | POA: Diagnosis not present

## 2022-06-08 DIAGNOSIS — F0283 Dementia in other diseases classified elsewhere, unspecified severity, with mood disturbance: Secondary | ICD-10-CM | POA: Diagnosis not present

## 2022-06-09 DIAGNOSIS — R519 Headache, unspecified: Secondary | ICD-10-CM | POA: Diagnosis not present

## 2022-06-09 DIAGNOSIS — R32 Unspecified urinary incontinence: Secondary | ICD-10-CM | POA: Diagnosis not present

## 2022-06-09 DIAGNOSIS — N1832 Chronic kidney disease, stage 3b: Secondary | ICD-10-CM | POA: Diagnosis not present

## 2022-06-09 DIAGNOSIS — G311 Senile degeneration of brain, not elsewhere classified: Secondary | ICD-10-CM | POA: Diagnosis not present

## 2022-06-09 DIAGNOSIS — E785 Hyperlipidemia, unspecified: Secondary | ICD-10-CM | POA: Diagnosis not present

## 2022-06-09 DIAGNOSIS — M1990 Unspecified osteoarthritis, unspecified site: Secondary | ICD-10-CM | POA: Diagnosis not present

## 2022-06-09 DIAGNOSIS — S129XXD Fracture of neck, unspecified, subsequent encounter: Secondary | ICD-10-CM | POA: Diagnosis not present

## 2022-06-09 DIAGNOSIS — I509 Heart failure, unspecified: Secondary | ICD-10-CM | POA: Diagnosis not present

## 2022-06-09 DIAGNOSIS — M810 Age-related osteoporosis without current pathological fracture: Secondary | ICD-10-CM | POA: Diagnosis not present

## 2022-06-09 DIAGNOSIS — I25119 Atherosclerotic heart disease of native coronary artery with unspecified angina pectoris: Secondary | ICD-10-CM | POA: Diagnosis not present

## 2022-06-09 DIAGNOSIS — F0283 Dementia in other diseases classified elsewhere, unspecified severity, with mood disturbance: Secondary | ICD-10-CM | POA: Diagnosis not present

## 2022-06-09 DIAGNOSIS — I4891 Unspecified atrial fibrillation: Secondary | ICD-10-CM | POA: Diagnosis not present

## 2022-06-09 DIAGNOSIS — D631 Anemia in chronic kidney disease: Secondary | ICD-10-CM | POA: Diagnosis not present

## 2022-06-09 DIAGNOSIS — I130 Hypertensive heart and chronic kidney disease with heart failure and stage 1 through stage 4 chronic kidney disease, or unspecified chronic kidney disease: Secondary | ICD-10-CM | POA: Diagnosis not present

## 2022-06-09 DIAGNOSIS — I272 Pulmonary hypertension, unspecified: Secondary | ICD-10-CM | POA: Diagnosis not present

## 2022-06-09 DIAGNOSIS — K219 Gastro-esophageal reflux disease without esophagitis: Secondary | ICD-10-CM | POA: Diagnosis not present

## 2022-06-09 DIAGNOSIS — J449 Chronic obstructive pulmonary disease, unspecified: Secondary | ICD-10-CM | POA: Diagnosis not present

## 2022-06-09 DIAGNOSIS — R1310 Dysphagia, unspecified: Secondary | ICD-10-CM | POA: Diagnosis not present

## 2022-06-09 DIAGNOSIS — M109 Gout, unspecified: Secondary | ICD-10-CM | POA: Diagnosis not present

## 2022-06-10 DIAGNOSIS — S129XXD Fracture of neck, unspecified, subsequent encounter: Secondary | ICD-10-CM | POA: Diagnosis not present

## 2022-06-10 DIAGNOSIS — G311 Senile degeneration of brain, not elsewhere classified: Secondary | ICD-10-CM | POA: Diagnosis not present

## 2022-06-10 DIAGNOSIS — I130 Hypertensive heart and chronic kidney disease with heart failure and stage 1 through stage 4 chronic kidney disease, or unspecified chronic kidney disease: Secondary | ICD-10-CM | POA: Diagnosis not present

## 2022-06-10 DIAGNOSIS — R1310 Dysphagia, unspecified: Secondary | ICD-10-CM | POA: Diagnosis not present

## 2022-06-10 DIAGNOSIS — I25119 Atherosclerotic heart disease of native coronary artery with unspecified angina pectoris: Secondary | ICD-10-CM | POA: Diagnosis not present

## 2022-06-10 DIAGNOSIS — F0283 Dementia in other diseases classified elsewhere, unspecified severity, with mood disturbance: Secondary | ICD-10-CM | POA: Diagnosis not present

## 2022-06-15 DIAGNOSIS — F0283 Dementia in other diseases classified elsewhere, unspecified severity, with mood disturbance: Secondary | ICD-10-CM | POA: Diagnosis not present

## 2022-06-15 DIAGNOSIS — R1310 Dysphagia, unspecified: Secondary | ICD-10-CM | POA: Diagnosis not present

## 2022-06-15 DIAGNOSIS — S129XXD Fracture of neck, unspecified, subsequent encounter: Secondary | ICD-10-CM | POA: Diagnosis not present

## 2022-06-15 DIAGNOSIS — I25119 Atherosclerotic heart disease of native coronary artery with unspecified angina pectoris: Secondary | ICD-10-CM | POA: Diagnosis not present

## 2022-06-15 DIAGNOSIS — I130 Hypertensive heart and chronic kidney disease with heart failure and stage 1 through stage 4 chronic kidney disease, or unspecified chronic kidney disease: Secondary | ICD-10-CM | POA: Diagnosis not present

## 2022-06-15 DIAGNOSIS — G311 Senile degeneration of brain, not elsewhere classified: Secondary | ICD-10-CM | POA: Diagnosis not present

## 2022-06-17 DIAGNOSIS — G311 Senile degeneration of brain, not elsewhere classified: Secondary | ICD-10-CM | POA: Diagnosis not present

## 2022-06-17 DIAGNOSIS — I25119 Atherosclerotic heart disease of native coronary artery with unspecified angina pectoris: Secondary | ICD-10-CM | POA: Diagnosis not present

## 2022-06-17 DIAGNOSIS — R1310 Dysphagia, unspecified: Secondary | ICD-10-CM | POA: Diagnosis not present

## 2022-06-17 DIAGNOSIS — F0283 Dementia in other diseases classified elsewhere, unspecified severity, with mood disturbance: Secondary | ICD-10-CM | POA: Diagnosis not present

## 2022-06-17 DIAGNOSIS — S129XXD Fracture of neck, unspecified, subsequent encounter: Secondary | ICD-10-CM | POA: Diagnosis not present

## 2022-06-17 DIAGNOSIS — I130 Hypertensive heart and chronic kidney disease with heart failure and stage 1 through stage 4 chronic kidney disease, or unspecified chronic kidney disease: Secondary | ICD-10-CM | POA: Diagnosis not present

## 2022-06-22 DIAGNOSIS — F0283 Dementia in other diseases classified elsewhere, unspecified severity, with mood disturbance: Secondary | ICD-10-CM | POA: Diagnosis not present

## 2022-06-22 DIAGNOSIS — G311 Senile degeneration of brain, not elsewhere classified: Secondary | ICD-10-CM | POA: Diagnosis not present

## 2022-06-22 DIAGNOSIS — I25119 Atherosclerotic heart disease of native coronary artery with unspecified angina pectoris: Secondary | ICD-10-CM | POA: Diagnosis not present

## 2022-06-22 DIAGNOSIS — R1310 Dysphagia, unspecified: Secondary | ICD-10-CM | POA: Diagnosis not present

## 2022-06-22 DIAGNOSIS — I130 Hypertensive heart and chronic kidney disease with heart failure and stage 1 through stage 4 chronic kidney disease, or unspecified chronic kidney disease: Secondary | ICD-10-CM | POA: Diagnosis not present

## 2022-06-22 DIAGNOSIS — S129XXD Fracture of neck, unspecified, subsequent encounter: Secondary | ICD-10-CM | POA: Diagnosis not present

## 2022-06-24 DIAGNOSIS — I130 Hypertensive heart and chronic kidney disease with heart failure and stage 1 through stage 4 chronic kidney disease, or unspecified chronic kidney disease: Secondary | ICD-10-CM | POA: Diagnosis not present

## 2022-06-24 DIAGNOSIS — G311 Senile degeneration of brain, not elsewhere classified: Secondary | ICD-10-CM | POA: Diagnosis not present

## 2022-06-24 DIAGNOSIS — F0283 Dementia in other diseases classified elsewhere, unspecified severity, with mood disturbance: Secondary | ICD-10-CM | POA: Diagnosis not present

## 2022-06-24 DIAGNOSIS — S129XXD Fracture of neck, unspecified, subsequent encounter: Secondary | ICD-10-CM | POA: Diagnosis not present

## 2022-06-24 DIAGNOSIS — I25119 Atherosclerotic heart disease of native coronary artery with unspecified angina pectoris: Secondary | ICD-10-CM | POA: Diagnosis not present

## 2022-06-24 DIAGNOSIS — R1310 Dysphagia, unspecified: Secondary | ICD-10-CM | POA: Diagnosis not present

## 2022-06-29 DIAGNOSIS — I130 Hypertensive heart and chronic kidney disease with heart failure and stage 1 through stage 4 chronic kidney disease, or unspecified chronic kidney disease: Secondary | ICD-10-CM | POA: Diagnosis not present

## 2022-06-29 DIAGNOSIS — I25119 Atherosclerotic heart disease of native coronary artery with unspecified angina pectoris: Secondary | ICD-10-CM | POA: Diagnosis not present

## 2022-06-29 DIAGNOSIS — G311 Senile degeneration of brain, not elsewhere classified: Secondary | ICD-10-CM | POA: Diagnosis not present

## 2022-06-29 DIAGNOSIS — F0283 Dementia in other diseases classified elsewhere, unspecified severity, with mood disturbance: Secondary | ICD-10-CM | POA: Diagnosis not present

## 2022-06-29 DIAGNOSIS — S129XXD Fracture of neck, unspecified, subsequent encounter: Secondary | ICD-10-CM | POA: Diagnosis not present

## 2022-06-29 DIAGNOSIS — R1310 Dysphagia, unspecified: Secondary | ICD-10-CM | POA: Diagnosis not present

## 2022-07-01 DIAGNOSIS — S129XXD Fracture of neck, unspecified, subsequent encounter: Secondary | ICD-10-CM | POA: Diagnosis not present

## 2022-07-01 DIAGNOSIS — I25119 Atherosclerotic heart disease of native coronary artery with unspecified angina pectoris: Secondary | ICD-10-CM | POA: Diagnosis not present

## 2022-07-01 DIAGNOSIS — I130 Hypertensive heart and chronic kidney disease with heart failure and stage 1 through stage 4 chronic kidney disease, or unspecified chronic kidney disease: Secondary | ICD-10-CM | POA: Diagnosis not present

## 2022-07-01 DIAGNOSIS — R1310 Dysphagia, unspecified: Secondary | ICD-10-CM | POA: Diagnosis not present

## 2022-07-01 DIAGNOSIS — G311 Senile degeneration of brain, not elsewhere classified: Secondary | ICD-10-CM | POA: Diagnosis not present

## 2022-07-01 DIAGNOSIS — F0283 Dementia in other diseases classified elsewhere, unspecified severity, with mood disturbance: Secondary | ICD-10-CM | POA: Diagnosis not present

## 2022-07-06 DIAGNOSIS — I130 Hypertensive heart and chronic kidney disease with heart failure and stage 1 through stage 4 chronic kidney disease, or unspecified chronic kidney disease: Secondary | ICD-10-CM | POA: Diagnosis not present

## 2022-07-06 DIAGNOSIS — I25119 Atherosclerotic heart disease of native coronary artery with unspecified angina pectoris: Secondary | ICD-10-CM | POA: Diagnosis not present

## 2022-07-06 DIAGNOSIS — R1310 Dysphagia, unspecified: Secondary | ICD-10-CM | POA: Diagnosis not present

## 2022-07-06 DIAGNOSIS — S129XXD Fracture of neck, unspecified, subsequent encounter: Secondary | ICD-10-CM | POA: Diagnosis not present

## 2022-07-06 DIAGNOSIS — F0283 Dementia in other diseases classified elsewhere, unspecified severity, with mood disturbance: Secondary | ICD-10-CM | POA: Diagnosis not present

## 2022-07-06 DIAGNOSIS — G311 Senile degeneration of brain, not elsewhere classified: Secondary | ICD-10-CM | POA: Diagnosis not present

## 2022-07-08 DIAGNOSIS — S129XXD Fracture of neck, unspecified, subsequent encounter: Secondary | ICD-10-CM | POA: Diagnosis not present

## 2022-07-08 DIAGNOSIS — I25119 Atherosclerotic heart disease of native coronary artery with unspecified angina pectoris: Secondary | ICD-10-CM | POA: Diagnosis not present

## 2022-07-08 DIAGNOSIS — F0283 Dementia in other diseases classified elsewhere, unspecified severity, with mood disturbance: Secondary | ICD-10-CM | POA: Diagnosis not present

## 2022-07-08 DIAGNOSIS — I130 Hypertensive heart and chronic kidney disease with heart failure and stage 1 through stage 4 chronic kidney disease, or unspecified chronic kidney disease: Secondary | ICD-10-CM | POA: Diagnosis not present

## 2022-07-08 DIAGNOSIS — R1310 Dysphagia, unspecified: Secondary | ICD-10-CM | POA: Diagnosis not present

## 2022-07-08 DIAGNOSIS — G311 Senile degeneration of brain, not elsewhere classified: Secondary | ICD-10-CM | POA: Diagnosis not present

## 2022-07-09 DIAGNOSIS — R1310 Dysphagia, unspecified: Secondary | ICD-10-CM | POA: Diagnosis not present

## 2022-07-09 DIAGNOSIS — I130 Hypertensive heart and chronic kidney disease with heart failure and stage 1 through stage 4 chronic kidney disease, or unspecified chronic kidney disease: Secondary | ICD-10-CM | POA: Diagnosis not present

## 2022-07-09 DIAGNOSIS — I25119 Atherosclerotic heart disease of native coronary artery with unspecified angina pectoris: Secondary | ICD-10-CM | POA: Diagnosis not present

## 2022-07-09 DIAGNOSIS — G311 Senile degeneration of brain, not elsewhere classified: Secondary | ICD-10-CM | POA: Diagnosis not present

## 2022-07-09 DIAGNOSIS — S129XXD Fracture of neck, unspecified, subsequent encounter: Secondary | ICD-10-CM | POA: Diagnosis not present

## 2022-07-09 DIAGNOSIS — F0283 Dementia in other diseases classified elsewhere, unspecified severity, with mood disturbance: Secondary | ICD-10-CM | POA: Diagnosis not present

## 2022-07-10 DIAGNOSIS — R1310 Dysphagia, unspecified: Secondary | ICD-10-CM | POA: Diagnosis not present

## 2022-07-10 DIAGNOSIS — I272 Pulmonary hypertension, unspecified: Secondary | ICD-10-CM | POA: Diagnosis not present

## 2022-07-10 DIAGNOSIS — I4891 Unspecified atrial fibrillation: Secondary | ICD-10-CM | POA: Diagnosis not present

## 2022-07-10 DIAGNOSIS — M1990 Unspecified osteoarthritis, unspecified site: Secondary | ICD-10-CM | POA: Diagnosis not present

## 2022-07-10 DIAGNOSIS — S129XXD Fracture of neck, unspecified, subsequent encounter: Secondary | ICD-10-CM | POA: Diagnosis not present

## 2022-07-10 DIAGNOSIS — I130 Hypertensive heart and chronic kidney disease with heart failure and stage 1 through stage 4 chronic kidney disease, or unspecified chronic kidney disease: Secondary | ICD-10-CM | POA: Diagnosis not present

## 2022-07-10 DIAGNOSIS — G311 Senile degeneration of brain, not elsewhere classified: Secondary | ICD-10-CM | POA: Diagnosis not present

## 2022-07-10 DIAGNOSIS — R296 Repeated falls: Secondary | ICD-10-CM | POA: Diagnosis not present

## 2022-07-10 DIAGNOSIS — K219 Gastro-esophageal reflux disease without esophagitis: Secondary | ICD-10-CM | POA: Diagnosis not present

## 2022-07-10 DIAGNOSIS — R519 Headache, unspecified: Secondary | ICD-10-CM | POA: Diagnosis not present

## 2022-07-10 DIAGNOSIS — F0283 Dementia in other diseases classified elsewhere, unspecified severity, with mood disturbance: Secondary | ICD-10-CM | POA: Diagnosis not present

## 2022-07-10 DIAGNOSIS — I509 Heart failure, unspecified: Secondary | ICD-10-CM | POA: Diagnosis not present

## 2022-07-10 DIAGNOSIS — M109 Gout, unspecified: Secondary | ICD-10-CM | POA: Diagnosis not present

## 2022-07-10 DIAGNOSIS — M810 Age-related osteoporosis without current pathological fracture: Secondary | ICD-10-CM | POA: Diagnosis not present

## 2022-07-10 DIAGNOSIS — N1832 Chronic kidney disease, stage 3b: Secondary | ICD-10-CM | POA: Diagnosis not present

## 2022-07-10 DIAGNOSIS — R32 Unspecified urinary incontinence: Secondary | ICD-10-CM | POA: Diagnosis not present

## 2022-07-10 DIAGNOSIS — E785 Hyperlipidemia, unspecified: Secondary | ICD-10-CM | POA: Diagnosis not present

## 2022-07-10 DIAGNOSIS — D631 Anemia in chronic kidney disease: Secondary | ICD-10-CM | POA: Diagnosis not present

## 2022-07-10 DIAGNOSIS — J449 Chronic obstructive pulmonary disease, unspecified: Secondary | ICD-10-CM | POA: Diagnosis not present

## 2022-07-10 DIAGNOSIS — I25119 Atherosclerotic heart disease of native coronary artery with unspecified angina pectoris: Secondary | ICD-10-CM | POA: Diagnosis not present

## 2022-07-13 DIAGNOSIS — I130 Hypertensive heart and chronic kidney disease with heart failure and stage 1 through stage 4 chronic kidney disease, or unspecified chronic kidney disease: Secondary | ICD-10-CM | POA: Diagnosis not present

## 2022-07-13 DIAGNOSIS — R1310 Dysphagia, unspecified: Secondary | ICD-10-CM | POA: Diagnosis not present

## 2022-07-13 DIAGNOSIS — F0283 Dementia in other diseases classified elsewhere, unspecified severity, with mood disturbance: Secondary | ICD-10-CM | POA: Diagnosis not present

## 2022-07-13 DIAGNOSIS — S129XXD Fracture of neck, unspecified, subsequent encounter: Secondary | ICD-10-CM | POA: Diagnosis not present

## 2022-07-13 DIAGNOSIS — I25119 Atherosclerotic heart disease of native coronary artery with unspecified angina pectoris: Secondary | ICD-10-CM | POA: Diagnosis not present

## 2022-07-13 DIAGNOSIS — G311 Senile degeneration of brain, not elsewhere classified: Secondary | ICD-10-CM | POA: Diagnosis not present

## 2022-07-15 DIAGNOSIS — G311 Senile degeneration of brain, not elsewhere classified: Secondary | ICD-10-CM | POA: Diagnosis not present

## 2022-07-15 DIAGNOSIS — I130 Hypertensive heart and chronic kidney disease with heart failure and stage 1 through stage 4 chronic kidney disease, or unspecified chronic kidney disease: Secondary | ICD-10-CM | POA: Diagnosis not present

## 2022-07-15 DIAGNOSIS — R1310 Dysphagia, unspecified: Secondary | ICD-10-CM | POA: Diagnosis not present

## 2022-07-15 DIAGNOSIS — S129XXD Fracture of neck, unspecified, subsequent encounter: Secondary | ICD-10-CM | POA: Diagnosis not present

## 2022-07-15 DIAGNOSIS — I25119 Atherosclerotic heart disease of native coronary artery with unspecified angina pectoris: Secondary | ICD-10-CM | POA: Diagnosis not present

## 2022-07-15 DIAGNOSIS — F0283 Dementia in other diseases classified elsewhere, unspecified severity, with mood disturbance: Secondary | ICD-10-CM | POA: Diagnosis not present

## 2022-07-20 DIAGNOSIS — R1310 Dysphagia, unspecified: Secondary | ICD-10-CM | POA: Diagnosis not present

## 2022-07-20 DIAGNOSIS — F0283 Dementia in other diseases classified elsewhere, unspecified severity, with mood disturbance: Secondary | ICD-10-CM | POA: Diagnosis not present

## 2022-07-20 DIAGNOSIS — I25119 Atherosclerotic heart disease of native coronary artery with unspecified angina pectoris: Secondary | ICD-10-CM | POA: Diagnosis not present

## 2022-07-20 DIAGNOSIS — S129XXD Fracture of neck, unspecified, subsequent encounter: Secondary | ICD-10-CM | POA: Diagnosis not present

## 2022-07-20 DIAGNOSIS — G311 Senile degeneration of brain, not elsewhere classified: Secondary | ICD-10-CM | POA: Diagnosis not present

## 2022-07-20 DIAGNOSIS — I130 Hypertensive heart and chronic kidney disease with heart failure and stage 1 through stage 4 chronic kidney disease, or unspecified chronic kidney disease: Secondary | ICD-10-CM | POA: Diagnosis not present

## 2022-07-22 DIAGNOSIS — I130 Hypertensive heart and chronic kidney disease with heart failure and stage 1 through stage 4 chronic kidney disease, or unspecified chronic kidney disease: Secondary | ICD-10-CM | POA: Diagnosis not present

## 2022-07-22 DIAGNOSIS — S129XXD Fracture of neck, unspecified, subsequent encounter: Secondary | ICD-10-CM | POA: Diagnosis not present

## 2022-07-22 DIAGNOSIS — F0283 Dementia in other diseases classified elsewhere, unspecified severity, with mood disturbance: Secondary | ICD-10-CM | POA: Diagnosis not present

## 2022-07-22 DIAGNOSIS — I25119 Atherosclerotic heart disease of native coronary artery with unspecified angina pectoris: Secondary | ICD-10-CM | POA: Diagnosis not present

## 2022-07-22 DIAGNOSIS — R1310 Dysphagia, unspecified: Secondary | ICD-10-CM | POA: Diagnosis not present

## 2022-07-22 DIAGNOSIS — G311 Senile degeneration of brain, not elsewhere classified: Secondary | ICD-10-CM | POA: Diagnosis not present

## 2022-07-23 DIAGNOSIS — I130 Hypertensive heart and chronic kidney disease with heart failure and stage 1 through stage 4 chronic kidney disease, or unspecified chronic kidney disease: Secondary | ICD-10-CM | POA: Diagnosis not present

## 2022-07-23 DIAGNOSIS — R1310 Dysphagia, unspecified: Secondary | ICD-10-CM | POA: Diagnosis not present

## 2022-07-23 DIAGNOSIS — S129XXD Fracture of neck, unspecified, subsequent encounter: Secondary | ICD-10-CM | POA: Diagnosis not present

## 2022-07-23 DIAGNOSIS — G311 Senile degeneration of brain, not elsewhere classified: Secondary | ICD-10-CM | POA: Diagnosis not present

## 2022-07-23 DIAGNOSIS — F0283 Dementia in other diseases classified elsewhere, unspecified severity, with mood disturbance: Secondary | ICD-10-CM | POA: Diagnosis not present

## 2022-07-23 DIAGNOSIS — I25119 Atherosclerotic heart disease of native coronary artery with unspecified angina pectoris: Secondary | ICD-10-CM | POA: Diagnosis not present

## 2022-07-27 DIAGNOSIS — F0283 Dementia in other diseases classified elsewhere, unspecified severity, with mood disturbance: Secondary | ICD-10-CM | POA: Diagnosis not present

## 2022-07-27 DIAGNOSIS — S129XXD Fracture of neck, unspecified, subsequent encounter: Secondary | ICD-10-CM | POA: Diagnosis not present

## 2022-07-27 DIAGNOSIS — I25119 Atherosclerotic heart disease of native coronary artery with unspecified angina pectoris: Secondary | ICD-10-CM | POA: Diagnosis not present

## 2022-07-27 DIAGNOSIS — G311 Senile degeneration of brain, not elsewhere classified: Secondary | ICD-10-CM | POA: Diagnosis not present

## 2022-07-27 DIAGNOSIS — R1310 Dysphagia, unspecified: Secondary | ICD-10-CM | POA: Diagnosis not present

## 2022-07-27 DIAGNOSIS — I130 Hypertensive heart and chronic kidney disease with heart failure and stage 1 through stage 4 chronic kidney disease, or unspecified chronic kidney disease: Secondary | ICD-10-CM | POA: Diagnosis not present

## 2022-07-29 DIAGNOSIS — F0283 Dementia in other diseases classified elsewhere, unspecified severity, with mood disturbance: Secondary | ICD-10-CM | POA: Diagnosis not present

## 2022-07-29 DIAGNOSIS — G311 Senile degeneration of brain, not elsewhere classified: Secondary | ICD-10-CM | POA: Diagnosis not present

## 2022-07-29 DIAGNOSIS — S129XXD Fracture of neck, unspecified, subsequent encounter: Secondary | ICD-10-CM | POA: Diagnosis not present

## 2022-07-29 DIAGNOSIS — I25119 Atherosclerotic heart disease of native coronary artery with unspecified angina pectoris: Secondary | ICD-10-CM | POA: Diagnosis not present

## 2022-07-29 DIAGNOSIS — R1310 Dysphagia, unspecified: Secondary | ICD-10-CM | POA: Diagnosis not present

## 2022-07-29 DIAGNOSIS — I130 Hypertensive heart and chronic kidney disease with heart failure and stage 1 through stage 4 chronic kidney disease, or unspecified chronic kidney disease: Secondary | ICD-10-CM | POA: Diagnosis not present

## 2022-07-31 DIAGNOSIS — F0283 Dementia in other diseases classified elsewhere, unspecified severity, with mood disturbance: Secondary | ICD-10-CM | POA: Diagnosis not present

## 2022-07-31 DIAGNOSIS — I130 Hypertensive heart and chronic kidney disease with heart failure and stage 1 through stage 4 chronic kidney disease, or unspecified chronic kidney disease: Secondary | ICD-10-CM | POA: Diagnosis not present

## 2022-07-31 DIAGNOSIS — S129XXD Fracture of neck, unspecified, subsequent encounter: Secondary | ICD-10-CM | POA: Diagnosis not present

## 2022-07-31 DIAGNOSIS — R1310 Dysphagia, unspecified: Secondary | ICD-10-CM | POA: Diagnosis not present

## 2022-07-31 DIAGNOSIS — G311 Senile degeneration of brain, not elsewhere classified: Secondary | ICD-10-CM | POA: Diagnosis not present

## 2022-07-31 DIAGNOSIS — I25119 Atherosclerotic heart disease of native coronary artery with unspecified angina pectoris: Secondary | ICD-10-CM | POA: Diagnosis not present

## 2022-08-03 DIAGNOSIS — S129XXD Fracture of neck, unspecified, subsequent encounter: Secondary | ICD-10-CM | POA: Diagnosis not present

## 2022-08-03 DIAGNOSIS — F0283 Dementia in other diseases classified elsewhere, unspecified severity, with mood disturbance: Secondary | ICD-10-CM | POA: Diagnosis not present

## 2022-08-03 DIAGNOSIS — I130 Hypertensive heart and chronic kidney disease with heart failure and stage 1 through stage 4 chronic kidney disease, or unspecified chronic kidney disease: Secondary | ICD-10-CM | POA: Diagnosis not present

## 2022-08-03 DIAGNOSIS — G311 Senile degeneration of brain, not elsewhere classified: Secondary | ICD-10-CM | POA: Diagnosis not present

## 2022-08-03 DIAGNOSIS — I25119 Atherosclerotic heart disease of native coronary artery with unspecified angina pectoris: Secondary | ICD-10-CM | POA: Diagnosis not present

## 2022-08-03 DIAGNOSIS — R1310 Dysphagia, unspecified: Secondary | ICD-10-CM | POA: Diagnosis not present

## 2022-08-05 DIAGNOSIS — R1310 Dysphagia, unspecified: Secondary | ICD-10-CM | POA: Diagnosis not present

## 2022-08-05 DIAGNOSIS — S129XXD Fracture of neck, unspecified, subsequent encounter: Secondary | ICD-10-CM | POA: Diagnosis not present

## 2022-08-05 DIAGNOSIS — I25119 Atherosclerotic heart disease of native coronary artery with unspecified angina pectoris: Secondary | ICD-10-CM | POA: Diagnosis not present

## 2022-08-05 DIAGNOSIS — F0283 Dementia in other diseases classified elsewhere, unspecified severity, with mood disturbance: Secondary | ICD-10-CM | POA: Diagnosis not present

## 2022-08-05 DIAGNOSIS — I130 Hypertensive heart and chronic kidney disease with heart failure and stage 1 through stage 4 chronic kidney disease, or unspecified chronic kidney disease: Secondary | ICD-10-CM | POA: Diagnosis not present

## 2022-08-05 DIAGNOSIS — G311 Senile degeneration of brain, not elsewhere classified: Secondary | ICD-10-CM | POA: Diagnosis not present

## 2022-08-09 DIAGNOSIS — I130 Hypertensive heart and chronic kidney disease with heart failure and stage 1 through stage 4 chronic kidney disease, or unspecified chronic kidney disease: Secondary | ICD-10-CM | POA: Diagnosis not present

## 2022-08-09 DIAGNOSIS — I4891 Unspecified atrial fibrillation: Secondary | ICD-10-CM | POA: Diagnosis not present

## 2022-08-09 DIAGNOSIS — I25119 Atherosclerotic heart disease of native coronary artery with unspecified angina pectoris: Secondary | ICD-10-CM | POA: Diagnosis not present

## 2022-08-09 DIAGNOSIS — R1310 Dysphagia, unspecified: Secondary | ICD-10-CM | POA: Diagnosis not present

## 2022-08-09 DIAGNOSIS — J449 Chronic obstructive pulmonary disease, unspecified: Secondary | ICD-10-CM | POA: Diagnosis not present

## 2022-08-09 DIAGNOSIS — I509 Heart failure, unspecified: Secondary | ICD-10-CM | POA: Diagnosis not present

## 2022-08-09 DIAGNOSIS — M1990 Unspecified osteoarthritis, unspecified site: Secondary | ICD-10-CM | POA: Diagnosis not present

## 2022-08-09 DIAGNOSIS — D631 Anemia in chronic kidney disease: Secondary | ICD-10-CM | POA: Diagnosis not present

## 2022-08-09 DIAGNOSIS — F0283 Dementia in other diseases classified elsewhere, unspecified severity, with mood disturbance: Secondary | ICD-10-CM | POA: Diagnosis not present

## 2022-08-09 DIAGNOSIS — G311 Senile degeneration of brain, not elsewhere classified: Secondary | ICD-10-CM | POA: Diagnosis not present

## 2022-08-09 DIAGNOSIS — E785 Hyperlipidemia, unspecified: Secondary | ICD-10-CM | POA: Diagnosis not present

## 2022-08-09 DIAGNOSIS — N1832 Chronic kidney disease, stage 3b: Secondary | ICD-10-CM | POA: Diagnosis not present

## 2022-08-09 DIAGNOSIS — R519 Headache, unspecified: Secondary | ICD-10-CM | POA: Diagnosis not present

## 2022-08-09 DIAGNOSIS — I272 Pulmonary hypertension, unspecified: Secondary | ICD-10-CM | POA: Diagnosis not present

## 2022-08-09 DIAGNOSIS — M109 Gout, unspecified: Secondary | ICD-10-CM | POA: Diagnosis not present

## 2022-08-09 DIAGNOSIS — R296 Repeated falls: Secondary | ICD-10-CM | POA: Diagnosis not present

## 2022-08-09 DIAGNOSIS — S129XXD Fracture of neck, unspecified, subsequent encounter: Secondary | ICD-10-CM | POA: Diagnosis not present

## 2022-08-09 DIAGNOSIS — M810 Age-related osteoporosis without current pathological fracture: Secondary | ICD-10-CM | POA: Diagnosis not present

## 2022-08-09 DIAGNOSIS — K219 Gastro-esophageal reflux disease without esophagitis: Secondary | ICD-10-CM | POA: Diagnosis not present

## 2022-08-09 DIAGNOSIS — R32 Unspecified urinary incontinence: Secondary | ICD-10-CM | POA: Diagnosis not present

## 2022-08-13 DIAGNOSIS — I25119 Atherosclerotic heart disease of native coronary artery with unspecified angina pectoris: Secondary | ICD-10-CM | POA: Diagnosis not present

## 2022-08-13 DIAGNOSIS — S129XXD Fracture of neck, unspecified, subsequent encounter: Secondary | ICD-10-CM | POA: Diagnosis not present

## 2022-08-13 DIAGNOSIS — G311 Senile degeneration of brain, not elsewhere classified: Secondary | ICD-10-CM | POA: Diagnosis not present

## 2022-08-13 DIAGNOSIS — I130 Hypertensive heart and chronic kidney disease with heart failure and stage 1 through stage 4 chronic kidney disease, or unspecified chronic kidney disease: Secondary | ICD-10-CM | POA: Diagnosis not present

## 2022-08-13 DIAGNOSIS — F0283 Dementia in other diseases classified elsewhere, unspecified severity, with mood disturbance: Secondary | ICD-10-CM | POA: Diagnosis not present

## 2022-08-13 DIAGNOSIS — R1310 Dysphagia, unspecified: Secondary | ICD-10-CM | POA: Diagnosis not present

## 2022-08-16 DIAGNOSIS — F0283 Dementia in other diseases classified elsewhere, unspecified severity, with mood disturbance: Secondary | ICD-10-CM | POA: Diagnosis not present

## 2022-08-16 DIAGNOSIS — G311 Senile degeneration of brain, not elsewhere classified: Secondary | ICD-10-CM | POA: Diagnosis not present

## 2022-08-16 DIAGNOSIS — R1310 Dysphagia, unspecified: Secondary | ICD-10-CM | POA: Diagnosis not present

## 2022-08-16 DIAGNOSIS — S129XXD Fracture of neck, unspecified, subsequent encounter: Secondary | ICD-10-CM | POA: Diagnosis not present

## 2022-08-16 DIAGNOSIS — I25119 Atherosclerotic heart disease of native coronary artery with unspecified angina pectoris: Secondary | ICD-10-CM | POA: Diagnosis not present

## 2022-08-16 DIAGNOSIS — I130 Hypertensive heart and chronic kidney disease with heart failure and stage 1 through stage 4 chronic kidney disease, or unspecified chronic kidney disease: Secondary | ICD-10-CM | POA: Diagnosis not present

## 2022-08-17 DIAGNOSIS — I130 Hypertensive heart and chronic kidney disease with heart failure and stage 1 through stage 4 chronic kidney disease, or unspecified chronic kidney disease: Secondary | ICD-10-CM | POA: Diagnosis not present

## 2022-08-17 DIAGNOSIS — F0283 Dementia in other diseases classified elsewhere, unspecified severity, with mood disturbance: Secondary | ICD-10-CM | POA: Diagnosis not present

## 2022-08-17 DIAGNOSIS — R1310 Dysphagia, unspecified: Secondary | ICD-10-CM | POA: Diagnosis not present

## 2022-08-17 DIAGNOSIS — I25119 Atherosclerotic heart disease of native coronary artery with unspecified angina pectoris: Secondary | ICD-10-CM | POA: Diagnosis not present

## 2022-08-17 DIAGNOSIS — G311 Senile degeneration of brain, not elsewhere classified: Secondary | ICD-10-CM | POA: Diagnosis not present

## 2022-08-17 DIAGNOSIS — S129XXD Fracture of neck, unspecified, subsequent encounter: Secondary | ICD-10-CM | POA: Diagnosis not present

## 2022-08-19 DIAGNOSIS — F0283 Dementia in other diseases classified elsewhere, unspecified severity, with mood disturbance: Secondary | ICD-10-CM | POA: Diagnosis not present

## 2022-08-19 DIAGNOSIS — I25119 Atherosclerotic heart disease of native coronary artery with unspecified angina pectoris: Secondary | ICD-10-CM | POA: Diagnosis not present

## 2022-08-19 DIAGNOSIS — R1310 Dysphagia, unspecified: Secondary | ICD-10-CM | POA: Diagnosis not present

## 2022-08-19 DIAGNOSIS — S129XXD Fracture of neck, unspecified, subsequent encounter: Secondary | ICD-10-CM | POA: Diagnosis not present

## 2022-08-19 DIAGNOSIS — G311 Senile degeneration of brain, not elsewhere classified: Secondary | ICD-10-CM | POA: Diagnosis not present

## 2022-08-19 DIAGNOSIS — I130 Hypertensive heart and chronic kidney disease with heart failure and stage 1 through stage 4 chronic kidney disease, or unspecified chronic kidney disease: Secondary | ICD-10-CM | POA: Diagnosis not present

## 2022-08-21 DIAGNOSIS — G311 Senile degeneration of brain, not elsewhere classified: Secondary | ICD-10-CM | POA: Diagnosis not present

## 2022-08-21 DIAGNOSIS — S129XXD Fracture of neck, unspecified, subsequent encounter: Secondary | ICD-10-CM | POA: Diagnosis not present

## 2022-08-21 DIAGNOSIS — I25119 Atherosclerotic heart disease of native coronary artery with unspecified angina pectoris: Secondary | ICD-10-CM | POA: Diagnosis not present

## 2022-08-21 DIAGNOSIS — F0283 Dementia in other diseases classified elsewhere, unspecified severity, with mood disturbance: Secondary | ICD-10-CM | POA: Diagnosis not present

## 2022-08-21 DIAGNOSIS — R1310 Dysphagia, unspecified: Secondary | ICD-10-CM | POA: Diagnosis not present

## 2022-08-21 DIAGNOSIS — I130 Hypertensive heart and chronic kidney disease with heart failure and stage 1 through stage 4 chronic kidney disease, or unspecified chronic kidney disease: Secondary | ICD-10-CM | POA: Diagnosis not present

## 2022-08-23 DIAGNOSIS — G311 Senile degeneration of brain, not elsewhere classified: Secondary | ICD-10-CM | POA: Diagnosis not present

## 2022-08-23 DIAGNOSIS — I130 Hypertensive heart and chronic kidney disease with heart failure and stage 1 through stage 4 chronic kidney disease, or unspecified chronic kidney disease: Secondary | ICD-10-CM | POA: Diagnosis not present

## 2022-08-23 DIAGNOSIS — I25119 Atherosclerotic heart disease of native coronary artery with unspecified angina pectoris: Secondary | ICD-10-CM | POA: Diagnosis not present

## 2022-08-23 DIAGNOSIS — R1310 Dysphagia, unspecified: Secondary | ICD-10-CM | POA: Diagnosis not present

## 2022-08-23 DIAGNOSIS — S129XXD Fracture of neck, unspecified, subsequent encounter: Secondary | ICD-10-CM | POA: Diagnosis not present

## 2022-08-23 DIAGNOSIS — F0283 Dementia in other diseases classified elsewhere, unspecified severity, with mood disturbance: Secondary | ICD-10-CM | POA: Diagnosis not present

## 2022-08-24 DIAGNOSIS — I25119 Atherosclerotic heart disease of native coronary artery with unspecified angina pectoris: Secondary | ICD-10-CM | POA: Diagnosis not present

## 2022-08-24 DIAGNOSIS — R1310 Dysphagia, unspecified: Secondary | ICD-10-CM | POA: Diagnosis not present

## 2022-08-24 DIAGNOSIS — S129XXD Fracture of neck, unspecified, subsequent encounter: Secondary | ICD-10-CM | POA: Diagnosis not present

## 2022-08-24 DIAGNOSIS — F0283 Dementia in other diseases classified elsewhere, unspecified severity, with mood disturbance: Secondary | ICD-10-CM | POA: Diagnosis not present

## 2022-08-24 DIAGNOSIS — G311 Senile degeneration of brain, not elsewhere classified: Secondary | ICD-10-CM | POA: Diagnosis not present

## 2022-08-24 DIAGNOSIS — I130 Hypertensive heart and chronic kidney disease with heart failure and stage 1 through stage 4 chronic kidney disease, or unspecified chronic kidney disease: Secondary | ICD-10-CM | POA: Diagnosis not present

## 2022-08-25 DIAGNOSIS — I25119 Atherosclerotic heart disease of native coronary artery with unspecified angina pectoris: Secondary | ICD-10-CM | POA: Diagnosis not present

## 2022-08-25 DIAGNOSIS — G311 Senile degeneration of brain, not elsewhere classified: Secondary | ICD-10-CM | POA: Diagnosis not present

## 2022-08-25 DIAGNOSIS — I130 Hypertensive heart and chronic kidney disease with heart failure and stage 1 through stage 4 chronic kidney disease, or unspecified chronic kidney disease: Secondary | ICD-10-CM | POA: Diagnosis not present

## 2022-08-25 DIAGNOSIS — F0283 Dementia in other diseases classified elsewhere, unspecified severity, with mood disturbance: Secondary | ICD-10-CM | POA: Diagnosis not present

## 2022-08-25 DIAGNOSIS — S129XXD Fracture of neck, unspecified, subsequent encounter: Secondary | ICD-10-CM | POA: Diagnosis not present

## 2022-08-25 DIAGNOSIS — R1310 Dysphagia, unspecified: Secondary | ICD-10-CM | POA: Diagnosis not present

## 2022-08-26 DIAGNOSIS — S129XXD Fracture of neck, unspecified, subsequent encounter: Secondary | ICD-10-CM | POA: Diagnosis not present

## 2022-08-26 DIAGNOSIS — R1310 Dysphagia, unspecified: Secondary | ICD-10-CM | POA: Diagnosis not present

## 2022-08-26 DIAGNOSIS — F0283 Dementia in other diseases classified elsewhere, unspecified severity, with mood disturbance: Secondary | ICD-10-CM | POA: Diagnosis not present

## 2022-08-26 DIAGNOSIS — G311 Senile degeneration of brain, not elsewhere classified: Secondary | ICD-10-CM | POA: Diagnosis not present

## 2022-08-26 DIAGNOSIS — I25119 Atherosclerotic heart disease of native coronary artery with unspecified angina pectoris: Secondary | ICD-10-CM | POA: Diagnosis not present

## 2022-08-26 DIAGNOSIS — I130 Hypertensive heart and chronic kidney disease with heart failure and stage 1 through stage 4 chronic kidney disease, or unspecified chronic kidney disease: Secondary | ICD-10-CM | POA: Diagnosis not present

## 2022-08-28 DIAGNOSIS — S129XXD Fracture of neck, unspecified, subsequent encounter: Secondary | ICD-10-CM | POA: Diagnosis not present

## 2022-08-28 DIAGNOSIS — F0283 Dementia in other diseases classified elsewhere, unspecified severity, with mood disturbance: Secondary | ICD-10-CM | POA: Diagnosis not present

## 2022-08-28 DIAGNOSIS — G311 Senile degeneration of brain, not elsewhere classified: Secondary | ICD-10-CM | POA: Diagnosis not present

## 2022-08-28 DIAGNOSIS — I25119 Atherosclerotic heart disease of native coronary artery with unspecified angina pectoris: Secondary | ICD-10-CM | POA: Diagnosis not present

## 2022-08-28 DIAGNOSIS — I130 Hypertensive heart and chronic kidney disease with heart failure and stage 1 through stage 4 chronic kidney disease, or unspecified chronic kidney disease: Secondary | ICD-10-CM | POA: Diagnosis not present

## 2022-08-28 DIAGNOSIS — R1310 Dysphagia, unspecified: Secondary | ICD-10-CM | POA: Diagnosis not present

## 2022-08-31 DIAGNOSIS — R1310 Dysphagia, unspecified: Secondary | ICD-10-CM | POA: Diagnosis not present

## 2022-08-31 DIAGNOSIS — G311 Senile degeneration of brain, not elsewhere classified: Secondary | ICD-10-CM | POA: Diagnosis not present

## 2022-08-31 DIAGNOSIS — F0283 Dementia in other diseases classified elsewhere, unspecified severity, with mood disturbance: Secondary | ICD-10-CM | POA: Diagnosis not present

## 2022-08-31 DIAGNOSIS — I130 Hypertensive heart and chronic kidney disease with heart failure and stage 1 through stage 4 chronic kidney disease, or unspecified chronic kidney disease: Secondary | ICD-10-CM | POA: Diagnosis not present

## 2022-08-31 DIAGNOSIS — I25119 Atherosclerotic heart disease of native coronary artery with unspecified angina pectoris: Secondary | ICD-10-CM | POA: Diagnosis not present

## 2022-08-31 DIAGNOSIS — S129XXD Fracture of neck, unspecified, subsequent encounter: Secondary | ICD-10-CM | POA: Diagnosis not present

## 2022-09-02 DIAGNOSIS — I130 Hypertensive heart and chronic kidney disease with heart failure and stage 1 through stage 4 chronic kidney disease, or unspecified chronic kidney disease: Secondary | ICD-10-CM | POA: Diagnosis not present

## 2022-09-02 DIAGNOSIS — R1310 Dysphagia, unspecified: Secondary | ICD-10-CM | POA: Diagnosis not present

## 2022-09-02 DIAGNOSIS — G311 Senile degeneration of brain, not elsewhere classified: Secondary | ICD-10-CM | POA: Diagnosis not present

## 2022-09-02 DIAGNOSIS — F0283 Dementia in other diseases classified elsewhere, unspecified severity, with mood disturbance: Secondary | ICD-10-CM | POA: Diagnosis not present

## 2022-09-02 DIAGNOSIS — S129XXD Fracture of neck, unspecified, subsequent encounter: Secondary | ICD-10-CM | POA: Diagnosis not present

## 2022-09-02 DIAGNOSIS — I25119 Atherosclerotic heart disease of native coronary artery with unspecified angina pectoris: Secondary | ICD-10-CM | POA: Diagnosis not present

## 2022-09-04 DIAGNOSIS — S129XXD Fracture of neck, unspecified, subsequent encounter: Secondary | ICD-10-CM | POA: Diagnosis not present

## 2022-09-04 DIAGNOSIS — R1310 Dysphagia, unspecified: Secondary | ICD-10-CM | POA: Diagnosis not present

## 2022-09-04 DIAGNOSIS — F0283 Dementia in other diseases classified elsewhere, unspecified severity, with mood disturbance: Secondary | ICD-10-CM | POA: Diagnosis not present

## 2022-09-04 DIAGNOSIS — I25119 Atherosclerotic heart disease of native coronary artery with unspecified angina pectoris: Secondary | ICD-10-CM | POA: Diagnosis not present

## 2022-09-04 DIAGNOSIS — I130 Hypertensive heart and chronic kidney disease with heart failure and stage 1 through stage 4 chronic kidney disease, or unspecified chronic kidney disease: Secondary | ICD-10-CM | POA: Diagnosis not present

## 2022-09-04 DIAGNOSIS — G311 Senile degeneration of brain, not elsewhere classified: Secondary | ICD-10-CM | POA: Diagnosis not present

## 2022-09-07 DIAGNOSIS — I25119 Atherosclerotic heart disease of native coronary artery with unspecified angina pectoris: Secondary | ICD-10-CM | POA: Diagnosis not present

## 2022-09-07 DIAGNOSIS — S129XXD Fracture of neck, unspecified, subsequent encounter: Secondary | ICD-10-CM | POA: Diagnosis not present

## 2022-09-07 DIAGNOSIS — I130 Hypertensive heart and chronic kidney disease with heart failure and stage 1 through stage 4 chronic kidney disease, or unspecified chronic kidney disease: Secondary | ICD-10-CM | POA: Diagnosis not present

## 2022-09-07 DIAGNOSIS — R1310 Dysphagia, unspecified: Secondary | ICD-10-CM | POA: Diagnosis not present

## 2022-09-07 DIAGNOSIS — F0283 Dementia in other diseases classified elsewhere, unspecified severity, with mood disturbance: Secondary | ICD-10-CM | POA: Diagnosis not present

## 2022-09-07 DIAGNOSIS — G311 Senile degeneration of brain, not elsewhere classified: Secondary | ICD-10-CM | POA: Diagnosis not present

## 2022-09-09 DIAGNOSIS — E785 Hyperlipidemia, unspecified: Secondary | ICD-10-CM | POA: Diagnosis not present

## 2022-09-09 DIAGNOSIS — K219 Gastro-esophageal reflux disease without esophagitis: Secondary | ICD-10-CM | POA: Diagnosis not present

## 2022-09-09 DIAGNOSIS — R1310 Dysphagia, unspecified: Secondary | ICD-10-CM | POA: Diagnosis not present

## 2022-09-09 DIAGNOSIS — M109 Gout, unspecified: Secondary | ICD-10-CM | POA: Diagnosis not present

## 2022-09-09 DIAGNOSIS — S129XXD Fracture of neck, unspecified, subsequent encounter: Secondary | ICD-10-CM | POA: Diagnosis not present

## 2022-09-09 DIAGNOSIS — G311 Senile degeneration of brain, not elsewhere classified: Secondary | ICD-10-CM | POA: Diagnosis not present

## 2022-09-09 DIAGNOSIS — M810 Age-related osteoporosis without current pathological fracture: Secondary | ICD-10-CM | POA: Diagnosis not present

## 2022-09-09 DIAGNOSIS — M1990 Unspecified osteoarthritis, unspecified site: Secondary | ICD-10-CM | POA: Diagnosis not present

## 2022-09-09 DIAGNOSIS — I130 Hypertensive heart and chronic kidney disease with heart failure and stage 1 through stage 4 chronic kidney disease, or unspecified chronic kidney disease: Secondary | ICD-10-CM | POA: Diagnosis not present

## 2022-09-09 DIAGNOSIS — I272 Pulmonary hypertension, unspecified: Secondary | ICD-10-CM | POA: Diagnosis not present

## 2022-09-09 DIAGNOSIS — F0283 Dementia in other diseases classified elsewhere, unspecified severity, with mood disturbance: Secondary | ICD-10-CM | POA: Diagnosis not present

## 2022-09-09 DIAGNOSIS — R296 Repeated falls: Secondary | ICD-10-CM | POA: Diagnosis not present

## 2022-09-09 DIAGNOSIS — R32 Unspecified urinary incontinence: Secondary | ICD-10-CM | POA: Diagnosis not present

## 2022-09-09 DIAGNOSIS — R519 Headache, unspecified: Secondary | ICD-10-CM | POA: Diagnosis not present

## 2022-09-09 DIAGNOSIS — N1832 Chronic kidney disease, stage 3b: Secondary | ICD-10-CM | POA: Diagnosis not present

## 2022-09-09 DIAGNOSIS — I509 Heart failure, unspecified: Secondary | ICD-10-CM | POA: Diagnosis not present

## 2022-09-09 DIAGNOSIS — J449 Chronic obstructive pulmonary disease, unspecified: Secondary | ICD-10-CM | POA: Diagnosis not present

## 2022-09-09 DIAGNOSIS — I4891 Unspecified atrial fibrillation: Secondary | ICD-10-CM | POA: Diagnosis not present

## 2022-09-09 DIAGNOSIS — I25119 Atherosclerotic heart disease of native coronary artery with unspecified angina pectoris: Secondary | ICD-10-CM | POA: Diagnosis not present

## 2022-09-09 DIAGNOSIS — D631 Anemia in chronic kidney disease: Secondary | ICD-10-CM | POA: Diagnosis not present

## 2022-09-11 DIAGNOSIS — I25119 Atherosclerotic heart disease of native coronary artery with unspecified angina pectoris: Secondary | ICD-10-CM | POA: Diagnosis not present

## 2022-09-11 DIAGNOSIS — R1310 Dysphagia, unspecified: Secondary | ICD-10-CM | POA: Diagnosis not present

## 2022-09-11 DIAGNOSIS — F0283 Dementia in other diseases classified elsewhere, unspecified severity, with mood disturbance: Secondary | ICD-10-CM | POA: Diagnosis not present

## 2022-09-11 DIAGNOSIS — G311 Senile degeneration of brain, not elsewhere classified: Secondary | ICD-10-CM | POA: Diagnosis not present

## 2022-09-11 DIAGNOSIS — S129XXD Fracture of neck, unspecified, subsequent encounter: Secondary | ICD-10-CM | POA: Diagnosis not present

## 2022-09-11 DIAGNOSIS — I130 Hypertensive heart and chronic kidney disease with heart failure and stage 1 through stage 4 chronic kidney disease, or unspecified chronic kidney disease: Secondary | ICD-10-CM | POA: Diagnosis not present

## 2022-09-13 DIAGNOSIS — F0283 Dementia in other diseases classified elsewhere, unspecified severity, with mood disturbance: Secondary | ICD-10-CM | POA: Diagnosis not present

## 2022-09-13 DIAGNOSIS — I130 Hypertensive heart and chronic kidney disease with heart failure and stage 1 through stage 4 chronic kidney disease, or unspecified chronic kidney disease: Secondary | ICD-10-CM | POA: Diagnosis not present

## 2022-09-13 DIAGNOSIS — I25119 Atherosclerotic heart disease of native coronary artery with unspecified angina pectoris: Secondary | ICD-10-CM | POA: Diagnosis not present

## 2022-09-13 DIAGNOSIS — G311 Senile degeneration of brain, not elsewhere classified: Secondary | ICD-10-CM | POA: Diagnosis not present

## 2022-09-13 DIAGNOSIS — R1310 Dysphagia, unspecified: Secondary | ICD-10-CM | POA: Diagnosis not present

## 2022-09-13 DIAGNOSIS — S129XXD Fracture of neck, unspecified, subsequent encounter: Secondary | ICD-10-CM | POA: Diagnosis not present

## 2022-09-14 DIAGNOSIS — F0283 Dementia in other diseases classified elsewhere, unspecified severity, with mood disturbance: Secondary | ICD-10-CM | POA: Diagnosis not present

## 2022-09-14 DIAGNOSIS — S129XXD Fracture of neck, unspecified, subsequent encounter: Secondary | ICD-10-CM | POA: Diagnosis not present

## 2022-09-14 DIAGNOSIS — G311 Senile degeneration of brain, not elsewhere classified: Secondary | ICD-10-CM | POA: Diagnosis not present

## 2022-09-14 DIAGNOSIS — I130 Hypertensive heart and chronic kidney disease with heart failure and stage 1 through stage 4 chronic kidney disease, or unspecified chronic kidney disease: Secondary | ICD-10-CM | POA: Diagnosis not present

## 2022-09-14 DIAGNOSIS — R1310 Dysphagia, unspecified: Secondary | ICD-10-CM | POA: Diagnosis not present

## 2022-09-14 DIAGNOSIS — I25119 Atherosclerotic heart disease of native coronary artery with unspecified angina pectoris: Secondary | ICD-10-CM | POA: Diagnosis not present

## 2022-09-16 DIAGNOSIS — I25119 Atherosclerotic heart disease of native coronary artery with unspecified angina pectoris: Secondary | ICD-10-CM | POA: Diagnosis not present

## 2022-09-16 DIAGNOSIS — I130 Hypertensive heart and chronic kidney disease with heart failure and stage 1 through stage 4 chronic kidney disease, or unspecified chronic kidney disease: Secondary | ICD-10-CM | POA: Diagnosis not present

## 2022-09-16 DIAGNOSIS — F0283 Dementia in other diseases classified elsewhere, unspecified severity, with mood disturbance: Secondary | ICD-10-CM | POA: Diagnosis not present

## 2022-09-16 DIAGNOSIS — G311 Senile degeneration of brain, not elsewhere classified: Secondary | ICD-10-CM | POA: Diagnosis not present

## 2022-09-16 DIAGNOSIS — R1310 Dysphagia, unspecified: Secondary | ICD-10-CM | POA: Diagnosis not present

## 2022-09-16 DIAGNOSIS — S129XXD Fracture of neck, unspecified, subsequent encounter: Secondary | ICD-10-CM | POA: Diagnosis not present

## 2022-09-18 DIAGNOSIS — R1310 Dysphagia, unspecified: Secondary | ICD-10-CM | POA: Diagnosis not present

## 2022-09-18 DIAGNOSIS — S129XXD Fracture of neck, unspecified, subsequent encounter: Secondary | ICD-10-CM | POA: Diagnosis not present

## 2022-09-18 DIAGNOSIS — I25119 Atherosclerotic heart disease of native coronary artery with unspecified angina pectoris: Secondary | ICD-10-CM | POA: Diagnosis not present

## 2022-09-18 DIAGNOSIS — I130 Hypertensive heart and chronic kidney disease with heart failure and stage 1 through stage 4 chronic kidney disease, or unspecified chronic kidney disease: Secondary | ICD-10-CM | POA: Diagnosis not present

## 2022-09-18 DIAGNOSIS — F0283 Dementia in other diseases classified elsewhere, unspecified severity, with mood disturbance: Secondary | ICD-10-CM | POA: Diagnosis not present

## 2022-09-18 DIAGNOSIS — G311 Senile degeneration of brain, not elsewhere classified: Secondary | ICD-10-CM | POA: Diagnosis not present

## 2022-09-20 DIAGNOSIS — G311 Senile degeneration of brain, not elsewhere classified: Secondary | ICD-10-CM | POA: Diagnosis not present

## 2022-09-20 DIAGNOSIS — I25119 Atherosclerotic heart disease of native coronary artery with unspecified angina pectoris: Secondary | ICD-10-CM | POA: Diagnosis not present

## 2022-09-20 DIAGNOSIS — R1310 Dysphagia, unspecified: Secondary | ICD-10-CM | POA: Diagnosis not present

## 2022-09-20 DIAGNOSIS — F0283 Dementia in other diseases classified elsewhere, unspecified severity, with mood disturbance: Secondary | ICD-10-CM | POA: Diagnosis not present

## 2022-09-20 DIAGNOSIS — S129XXD Fracture of neck, unspecified, subsequent encounter: Secondary | ICD-10-CM | POA: Diagnosis not present

## 2022-09-20 DIAGNOSIS — I130 Hypertensive heart and chronic kidney disease with heart failure and stage 1 through stage 4 chronic kidney disease, or unspecified chronic kidney disease: Secondary | ICD-10-CM | POA: Diagnosis not present

## 2022-09-21 DIAGNOSIS — I130 Hypertensive heart and chronic kidney disease with heart failure and stage 1 through stage 4 chronic kidney disease, or unspecified chronic kidney disease: Secondary | ICD-10-CM | POA: Diagnosis not present

## 2022-09-21 DIAGNOSIS — R1310 Dysphagia, unspecified: Secondary | ICD-10-CM | POA: Diagnosis not present

## 2022-09-21 DIAGNOSIS — I25119 Atherosclerotic heart disease of native coronary artery with unspecified angina pectoris: Secondary | ICD-10-CM | POA: Diagnosis not present

## 2022-09-21 DIAGNOSIS — S129XXD Fracture of neck, unspecified, subsequent encounter: Secondary | ICD-10-CM | POA: Diagnosis not present

## 2022-09-21 DIAGNOSIS — G311 Senile degeneration of brain, not elsewhere classified: Secondary | ICD-10-CM | POA: Diagnosis not present

## 2022-09-21 DIAGNOSIS — F0283 Dementia in other diseases classified elsewhere, unspecified severity, with mood disturbance: Secondary | ICD-10-CM | POA: Diagnosis not present

## 2022-09-23 DIAGNOSIS — S129XXD Fracture of neck, unspecified, subsequent encounter: Secondary | ICD-10-CM | POA: Diagnosis not present

## 2022-09-23 DIAGNOSIS — I25119 Atherosclerotic heart disease of native coronary artery with unspecified angina pectoris: Secondary | ICD-10-CM | POA: Diagnosis not present

## 2022-09-23 DIAGNOSIS — I130 Hypertensive heart and chronic kidney disease with heart failure and stage 1 through stage 4 chronic kidney disease, or unspecified chronic kidney disease: Secondary | ICD-10-CM | POA: Diagnosis not present

## 2022-09-23 DIAGNOSIS — R1310 Dysphagia, unspecified: Secondary | ICD-10-CM | POA: Diagnosis not present

## 2022-09-23 DIAGNOSIS — G311 Senile degeneration of brain, not elsewhere classified: Secondary | ICD-10-CM | POA: Diagnosis not present

## 2022-09-23 DIAGNOSIS — F0283 Dementia in other diseases classified elsewhere, unspecified severity, with mood disturbance: Secondary | ICD-10-CM | POA: Diagnosis not present

## 2022-09-24 DIAGNOSIS — F0283 Dementia in other diseases classified elsewhere, unspecified severity, with mood disturbance: Secondary | ICD-10-CM | POA: Diagnosis not present

## 2022-09-24 DIAGNOSIS — I130 Hypertensive heart and chronic kidney disease with heart failure and stage 1 through stage 4 chronic kidney disease, or unspecified chronic kidney disease: Secondary | ICD-10-CM | POA: Diagnosis not present

## 2022-09-24 DIAGNOSIS — I25119 Atherosclerotic heart disease of native coronary artery with unspecified angina pectoris: Secondary | ICD-10-CM | POA: Diagnosis not present

## 2022-09-24 DIAGNOSIS — R1310 Dysphagia, unspecified: Secondary | ICD-10-CM | POA: Diagnosis not present

## 2022-09-24 DIAGNOSIS — G311 Senile degeneration of brain, not elsewhere classified: Secondary | ICD-10-CM | POA: Diagnosis not present

## 2022-09-24 DIAGNOSIS — S129XXD Fracture of neck, unspecified, subsequent encounter: Secondary | ICD-10-CM | POA: Diagnosis not present

## 2022-09-25 DIAGNOSIS — F0283 Dementia in other diseases classified elsewhere, unspecified severity, with mood disturbance: Secondary | ICD-10-CM | POA: Diagnosis not present

## 2022-09-25 DIAGNOSIS — I25119 Atherosclerotic heart disease of native coronary artery with unspecified angina pectoris: Secondary | ICD-10-CM | POA: Diagnosis not present

## 2022-09-25 DIAGNOSIS — R1310 Dysphagia, unspecified: Secondary | ICD-10-CM | POA: Diagnosis not present

## 2022-09-25 DIAGNOSIS — I130 Hypertensive heart and chronic kidney disease with heart failure and stage 1 through stage 4 chronic kidney disease, or unspecified chronic kidney disease: Secondary | ICD-10-CM | POA: Diagnosis not present

## 2022-09-25 DIAGNOSIS — S129XXD Fracture of neck, unspecified, subsequent encounter: Secondary | ICD-10-CM | POA: Diagnosis not present

## 2022-09-25 DIAGNOSIS — G311 Senile degeneration of brain, not elsewhere classified: Secondary | ICD-10-CM | POA: Diagnosis not present

## 2022-09-28 DIAGNOSIS — S129XXD Fracture of neck, unspecified, subsequent encounter: Secondary | ICD-10-CM | POA: Diagnosis not present

## 2022-09-28 DIAGNOSIS — I130 Hypertensive heart and chronic kidney disease with heart failure and stage 1 through stage 4 chronic kidney disease, or unspecified chronic kidney disease: Secondary | ICD-10-CM | POA: Diagnosis not present

## 2022-09-28 DIAGNOSIS — G311 Senile degeneration of brain, not elsewhere classified: Secondary | ICD-10-CM | POA: Diagnosis not present

## 2022-09-28 DIAGNOSIS — R1310 Dysphagia, unspecified: Secondary | ICD-10-CM | POA: Diagnosis not present

## 2022-09-28 DIAGNOSIS — F0283 Dementia in other diseases classified elsewhere, unspecified severity, with mood disturbance: Secondary | ICD-10-CM | POA: Diagnosis not present

## 2022-09-28 DIAGNOSIS — I25119 Atherosclerotic heart disease of native coronary artery with unspecified angina pectoris: Secondary | ICD-10-CM | POA: Diagnosis not present

## 2022-09-30 DIAGNOSIS — S129XXD Fracture of neck, unspecified, subsequent encounter: Secondary | ICD-10-CM | POA: Diagnosis not present

## 2022-09-30 DIAGNOSIS — R1310 Dysphagia, unspecified: Secondary | ICD-10-CM | POA: Diagnosis not present

## 2022-09-30 DIAGNOSIS — I25119 Atherosclerotic heart disease of native coronary artery with unspecified angina pectoris: Secondary | ICD-10-CM | POA: Diagnosis not present

## 2022-09-30 DIAGNOSIS — F0283 Dementia in other diseases classified elsewhere, unspecified severity, with mood disturbance: Secondary | ICD-10-CM | POA: Diagnosis not present

## 2022-09-30 DIAGNOSIS — G311 Senile degeneration of brain, not elsewhere classified: Secondary | ICD-10-CM | POA: Diagnosis not present

## 2022-09-30 DIAGNOSIS — I130 Hypertensive heart and chronic kidney disease with heart failure and stage 1 through stage 4 chronic kidney disease, or unspecified chronic kidney disease: Secondary | ICD-10-CM | POA: Diagnosis not present

## 2022-10-01 DIAGNOSIS — I25119 Atherosclerotic heart disease of native coronary artery with unspecified angina pectoris: Secondary | ICD-10-CM | POA: Diagnosis not present

## 2022-10-01 DIAGNOSIS — R1310 Dysphagia, unspecified: Secondary | ICD-10-CM | POA: Diagnosis not present

## 2022-10-01 DIAGNOSIS — I130 Hypertensive heart and chronic kidney disease with heart failure and stage 1 through stage 4 chronic kidney disease, or unspecified chronic kidney disease: Secondary | ICD-10-CM | POA: Diagnosis not present

## 2022-10-01 DIAGNOSIS — G311 Senile degeneration of brain, not elsewhere classified: Secondary | ICD-10-CM | POA: Diagnosis not present

## 2022-10-01 DIAGNOSIS — F0283 Dementia in other diseases classified elsewhere, unspecified severity, with mood disturbance: Secondary | ICD-10-CM | POA: Diagnosis not present

## 2022-10-01 DIAGNOSIS — S129XXD Fracture of neck, unspecified, subsequent encounter: Secondary | ICD-10-CM | POA: Diagnosis not present

## 2022-10-02 DIAGNOSIS — F0283 Dementia in other diseases classified elsewhere, unspecified severity, with mood disturbance: Secondary | ICD-10-CM | POA: Diagnosis not present

## 2022-10-02 DIAGNOSIS — R1310 Dysphagia, unspecified: Secondary | ICD-10-CM | POA: Diagnosis not present

## 2022-10-02 DIAGNOSIS — I130 Hypertensive heart and chronic kidney disease with heart failure and stage 1 through stage 4 chronic kidney disease, or unspecified chronic kidney disease: Secondary | ICD-10-CM | POA: Diagnosis not present

## 2022-10-02 DIAGNOSIS — I25119 Atherosclerotic heart disease of native coronary artery with unspecified angina pectoris: Secondary | ICD-10-CM | POA: Diagnosis not present

## 2022-10-02 DIAGNOSIS — S129XXD Fracture of neck, unspecified, subsequent encounter: Secondary | ICD-10-CM | POA: Diagnosis not present

## 2022-10-02 DIAGNOSIS — G311 Senile degeneration of brain, not elsewhere classified: Secondary | ICD-10-CM | POA: Diagnosis not present

## 2022-10-05 DIAGNOSIS — F0283 Dementia in other diseases classified elsewhere, unspecified severity, with mood disturbance: Secondary | ICD-10-CM | POA: Diagnosis not present

## 2022-10-05 DIAGNOSIS — S129XXD Fracture of neck, unspecified, subsequent encounter: Secondary | ICD-10-CM | POA: Diagnosis not present

## 2022-10-05 DIAGNOSIS — I25119 Atherosclerotic heart disease of native coronary artery with unspecified angina pectoris: Secondary | ICD-10-CM | POA: Diagnosis not present

## 2022-10-05 DIAGNOSIS — G311 Senile degeneration of brain, not elsewhere classified: Secondary | ICD-10-CM | POA: Diagnosis not present

## 2022-10-05 DIAGNOSIS — R1310 Dysphagia, unspecified: Secondary | ICD-10-CM | POA: Diagnosis not present

## 2022-10-05 DIAGNOSIS — I130 Hypertensive heart and chronic kidney disease with heart failure and stage 1 through stage 4 chronic kidney disease, or unspecified chronic kidney disease: Secondary | ICD-10-CM | POA: Diagnosis not present

## 2022-10-07 DIAGNOSIS — I130 Hypertensive heart and chronic kidney disease with heart failure and stage 1 through stage 4 chronic kidney disease, or unspecified chronic kidney disease: Secondary | ICD-10-CM | POA: Diagnosis not present

## 2022-10-07 DIAGNOSIS — S129XXD Fracture of neck, unspecified, subsequent encounter: Secondary | ICD-10-CM | POA: Diagnosis not present

## 2022-10-07 DIAGNOSIS — G311 Senile degeneration of brain, not elsewhere classified: Secondary | ICD-10-CM | POA: Diagnosis not present

## 2022-10-07 DIAGNOSIS — I25119 Atherosclerotic heart disease of native coronary artery with unspecified angina pectoris: Secondary | ICD-10-CM | POA: Diagnosis not present

## 2022-10-07 DIAGNOSIS — R1310 Dysphagia, unspecified: Secondary | ICD-10-CM | POA: Diagnosis not present

## 2022-10-07 DIAGNOSIS — F0283 Dementia in other diseases classified elsewhere, unspecified severity, with mood disturbance: Secondary | ICD-10-CM | POA: Diagnosis not present

## 2022-10-08 DIAGNOSIS — J441 Chronic obstructive pulmonary disease with (acute) exacerbation: Secondary | ICD-10-CM | POA: Diagnosis not present

## 2022-10-09 DIAGNOSIS — S129XXD Fracture of neck, unspecified, subsequent encounter: Secondary | ICD-10-CM | POA: Diagnosis not present

## 2022-10-09 DIAGNOSIS — I25119 Atherosclerotic heart disease of native coronary artery with unspecified angina pectoris: Secondary | ICD-10-CM | POA: Diagnosis not present

## 2022-10-09 DIAGNOSIS — G311 Senile degeneration of brain, not elsewhere classified: Secondary | ICD-10-CM | POA: Diagnosis not present

## 2022-10-09 DIAGNOSIS — F0283 Dementia in other diseases classified elsewhere, unspecified severity, with mood disturbance: Secondary | ICD-10-CM | POA: Diagnosis not present

## 2022-10-09 DIAGNOSIS — R1310 Dysphagia, unspecified: Secondary | ICD-10-CM | POA: Diagnosis not present

## 2022-10-09 DIAGNOSIS — I130 Hypertensive heart and chronic kidney disease with heart failure and stage 1 through stage 4 chronic kidney disease, or unspecified chronic kidney disease: Secondary | ICD-10-CM | POA: Diagnosis not present

## 2022-10-10 DIAGNOSIS — S129XXD Fracture of neck, unspecified, subsequent encounter: Secondary | ICD-10-CM | POA: Diagnosis not present

## 2022-10-10 DIAGNOSIS — F0283 Dementia in other diseases classified elsewhere, unspecified severity, with mood disturbance: Secondary | ICD-10-CM | POA: Diagnosis not present

## 2022-10-10 DIAGNOSIS — N1832 Chronic kidney disease, stage 3b: Secondary | ICD-10-CM | POA: Diagnosis not present

## 2022-10-10 DIAGNOSIS — J449 Chronic obstructive pulmonary disease, unspecified: Secondary | ICD-10-CM | POA: Diagnosis not present

## 2022-10-10 DIAGNOSIS — I272 Pulmonary hypertension, unspecified: Secondary | ICD-10-CM | POA: Diagnosis not present

## 2022-10-10 DIAGNOSIS — G311 Senile degeneration of brain, not elsewhere classified: Secondary | ICD-10-CM | POA: Diagnosis not present

## 2022-10-10 DIAGNOSIS — I4891 Unspecified atrial fibrillation: Secondary | ICD-10-CM | POA: Diagnosis not present

## 2022-10-10 DIAGNOSIS — I130 Hypertensive heart and chronic kidney disease with heart failure and stage 1 through stage 4 chronic kidney disease, or unspecified chronic kidney disease: Secondary | ICD-10-CM | POA: Diagnosis not present

## 2022-10-10 DIAGNOSIS — E785 Hyperlipidemia, unspecified: Secondary | ICD-10-CM | POA: Diagnosis not present

## 2022-10-10 DIAGNOSIS — I509 Heart failure, unspecified: Secondary | ICD-10-CM | POA: Diagnosis not present

## 2022-10-10 DIAGNOSIS — I25119 Atherosclerotic heart disease of native coronary artery with unspecified angina pectoris: Secondary | ICD-10-CM | POA: Diagnosis not present

## 2022-10-10 DIAGNOSIS — R1310 Dysphagia, unspecified: Secondary | ICD-10-CM | POA: Diagnosis not present

## 2022-10-11 DIAGNOSIS — R1310 Dysphagia, unspecified: Secondary | ICD-10-CM | POA: Diagnosis not present

## 2022-10-11 DIAGNOSIS — I130 Hypertensive heart and chronic kidney disease with heart failure and stage 1 through stage 4 chronic kidney disease, or unspecified chronic kidney disease: Secondary | ICD-10-CM | POA: Diagnosis not present

## 2022-10-11 DIAGNOSIS — G311 Senile degeneration of brain, not elsewhere classified: Secondary | ICD-10-CM | POA: Diagnosis not present

## 2022-10-11 DIAGNOSIS — I25119 Atherosclerotic heart disease of native coronary artery with unspecified angina pectoris: Secondary | ICD-10-CM | POA: Diagnosis not present

## 2022-10-11 DIAGNOSIS — F0283 Dementia in other diseases classified elsewhere, unspecified severity, with mood disturbance: Secondary | ICD-10-CM | POA: Diagnosis not present

## 2022-10-11 DIAGNOSIS — S129XXD Fracture of neck, unspecified, subsequent encounter: Secondary | ICD-10-CM | POA: Diagnosis not present

## 2022-10-12 DIAGNOSIS — G311 Senile degeneration of brain, not elsewhere classified: Secondary | ICD-10-CM | POA: Diagnosis not present

## 2022-10-12 DIAGNOSIS — S129XXD Fracture of neck, unspecified, subsequent encounter: Secondary | ICD-10-CM | POA: Diagnosis not present

## 2022-10-12 DIAGNOSIS — F0283 Dementia in other diseases classified elsewhere, unspecified severity, with mood disturbance: Secondary | ICD-10-CM | POA: Diagnosis not present

## 2022-10-12 DIAGNOSIS — I25119 Atherosclerotic heart disease of native coronary artery with unspecified angina pectoris: Secondary | ICD-10-CM | POA: Diagnosis not present

## 2022-10-12 DIAGNOSIS — R1310 Dysphagia, unspecified: Secondary | ICD-10-CM | POA: Diagnosis not present

## 2022-10-12 DIAGNOSIS — I130 Hypertensive heart and chronic kidney disease with heart failure and stage 1 through stage 4 chronic kidney disease, or unspecified chronic kidney disease: Secondary | ICD-10-CM | POA: Diagnosis not present

## 2022-10-13 DIAGNOSIS — R1310 Dysphagia, unspecified: Secondary | ICD-10-CM | POA: Diagnosis not present

## 2022-10-13 DIAGNOSIS — F0283 Dementia in other diseases classified elsewhere, unspecified severity, with mood disturbance: Secondary | ICD-10-CM | POA: Diagnosis not present

## 2022-10-13 DIAGNOSIS — I130 Hypertensive heart and chronic kidney disease with heart failure and stage 1 through stage 4 chronic kidney disease, or unspecified chronic kidney disease: Secondary | ICD-10-CM | POA: Diagnosis not present

## 2022-10-13 DIAGNOSIS — S129XXD Fracture of neck, unspecified, subsequent encounter: Secondary | ICD-10-CM | POA: Diagnosis not present

## 2022-10-13 DIAGNOSIS — G311 Senile degeneration of brain, not elsewhere classified: Secondary | ICD-10-CM | POA: Diagnosis not present

## 2022-10-13 DIAGNOSIS — I25119 Atherosclerotic heart disease of native coronary artery with unspecified angina pectoris: Secondary | ICD-10-CM | POA: Diagnosis not present

## 2022-10-14 DIAGNOSIS — F0283 Dementia in other diseases classified elsewhere, unspecified severity, with mood disturbance: Secondary | ICD-10-CM | POA: Diagnosis not present

## 2022-10-14 DIAGNOSIS — R1310 Dysphagia, unspecified: Secondary | ICD-10-CM | POA: Diagnosis not present

## 2022-10-14 DIAGNOSIS — I5032 Chronic diastolic (congestive) heart failure: Secondary | ICD-10-CM | POA: Diagnosis not present

## 2022-10-14 DIAGNOSIS — R42 Dizziness and giddiness: Secondary | ICD-10-CM | POA: Diagnosis not present

## 2022-10-14 DIAGNOSIS — K120 Recurrent oral aphthae: Secondary | ICD-10-CM | POA: Diagnosis not present

## 2022-10-14 DIAGNOSIS — R0600 Dyspnea, unspecified: Secondary | ICD-10-CM | POA: Diagnosis not present

## 2022-10-14 DIAGNOSIS — R54 Age-related physical debility: Secondary | ICD-10-CM | POA: Diagnosis not present

## 2022-10-14 DIAGNOSIS — I129 Hypertensive chronic kidney disease with stage 1 through stage 4 chronic kidney disease, or unspecified chronic kidney disease: Secondary | ICD-10-CM | POA: Diagnosis not present

## 2022-10-14 DIAGNOSIS — I130 Hypertensive heart and chronic kidney disease with heart failure and stage 1 through stage 4 chronic kidney disease, or unspecified chronic kidney disease: Secondary | ICD-10-CM | POA: Diagnosis not present

## 2022-10-14 DIAGNOSIS — S129XXD Fracture of neck, unspecified, subsequent encounter: Secondary | ICD-10-CM | POA: Diagnosis not present

## 2022-10-14 DIAGNOSIS — I25119 Atherosclerotic heart disease of native coronary artery with unspecified angina pectoris: Secondary | ICD-10-CM | POA: Diagnosis not present

## 2022-10-14 DIAGNOSIS — G311 Senile degeneration of brain, not elsewhere classified: Secondary | ICD-10-CM | POA: Diagnosis not present

## 2022-10-15 DIAGNOSIS — S129XXD Fracture of neck, unspecified, subsequent encounter: Secondary | ICD-10-CM | POA: Diagnosis not present

## 2022-10-15 DIAGNOSIS — G311 Senile degeneration of brain, not elsewhere classified: Secondary | ICD-10-CM | POA: Diagnosis not present

## 2022-10-15 DIAGNOSIS — I25119 Atherosclerotic heart disease of native coronary artery with unspecified angina pectoris: Secondary | ICD-10-CM | POA: Diagnosis not present

## 2022-10-15 DIAGNOSIS — F0283 Dementia in other diseases classified elsewhere, unspecified severity, with mood disturbance: Secondary | ICD-10-CM | POA: Diagnosis not present

## 2022-10-15 DIAGNOSIS — R1310 Dysphagia, unspecified: Secondary | ICD-10-CM | POA: Diagnosis not present

## 2022-10-15 DIAGNOSIS — I130 Hypertensive heart and chronic kidney disease with heart failure and stage 1 through stage 4 chronic kidney disease, or unspecified chronic kidney disease: Secondary | ICD-10-CM | POA: Diagnosis not present

## 2022-10-16 DIAGNOSIS — I25119 Atherosclerotic heart disease of native coronary artery with unspecified angina pectoris: Secondary | ICD-10-CM | POA: Diagnosis not present

## 2022-10-16 DIAGNOSIS — S129XXD Fracture of neck, unspecified, subsequent encounter: Secondary | ICD-10-CM | POA: Diagnosis not present

## 2022-10-16 DIAGNOSIS — G311 Senile degeneration of brain, not elsewhere classified: Secondary | ICD-10-CM | POA: Diagnosis not present

## 2022-10-16 DIAGNOSIS — R1310 Dysphagia, unspecified: Secondary | ICD-10-CM | POA: Diagnosis not present

## 2022-10-16 DIAGNOSIS — I130 Hypertensive heart and chronic kidney disease with heart failure and stage 1 through stage 4 chronic kidney disease, or unspecified chronic kidney disease: Secondary | ICD-10-CM | POA: Diagnosis not present

## 2022-10-16 DIAGNOSIS — F0283 Dementia in other diseases classified elsewhere, unspecified severity, with mood disturbance: Secondary | ICD-10-CM | POA: Diagnosis not present

## 2022-10-17 DIAGNOSIS — G311 Senile degeneration of brain, not elsewhere classified: Secondary | ICD-10-CM | POA: Diagnosis not present

## 2022-10-17 DIAGNOSIS — S129XXD Fracture of neck, unspecified, subsequent encounter: Secondary | ICD-10-CM | POA: Diagnosis not present

## 2022-10-17 DIAGNOSIS — F0283 Dementia in other diseases classified elsewhere, unspecified severity, with mood disturbance: Secondary | ICD-10-CM | POA: Diagnosis not present

## 2022-10-17 DIAGNOSIS — I130 Hypertensive heart and chronic kidney disease with heart failure and stage 1 through stage 4 chronic kidney disease, or unspecified chronic kidney disease: Secondary | ICD-10-CM | POA: Diagnosis not present

## 2022-10-17 DIAGNOSIS — I25119 Atherosclerotic heart disease of native coronary artery with unspecified angina pectoris: Secondary | ICD-10-CM | POA: Diagnosis not present

## 2022-10-17 DIAGNOSIS — R1310 Dysphagia, unspecified: Secondary | ICD-10-CM | POA: Diagnosis not present

## 2022-10-18 DIAGNOSIS — R1310 Dysphagia, unspecified: Secondary | ICD-10-CM | POA: Diagnosis not present

## 2022-10-18 DIAGNOSIS — S129XXD Fracture of neck, unspecified, subsequent encounter: Secondary | ICD-10-CM | POA: Diagnosis not present

## 2022-10-18 DIAGNOSIS — G311 Senile degeneration of brain, not elsewhere classified: Secondary | ICD-10-CM | POA: Diagnosis not present

## 2022-10-18 DIAGNOSIS — F0283 Dementia in other diseases classified elsewhere, unspecified severity, with mood disturbance: Secondary | ICD-10-CM | POA: Diagnosis not present

## 2022-10-18 DIAGNOSIS — I25119 Atherosclerotic heart disease of native coronary artery with unspecified angina pectoris: Secondary | ICD-10-CM | POA: Diagnosis not present

## 2022-10-18 DIAGNOSIS — I130 Hypertensive heart and chronic kidney disease with heart failure and stage 1 through stage 4 chronic kidney disease, or unspecified chronic kidney disease: Secondary | ICD-10-CM | POA: Diagnosis not present

## 2022-10-19 DIAGNOSIS — R1310 Dysphagia, unspecified: Secondary | ICD-10-CM | POA: Diagnosis not present

## 2022-10-19 DIAGNOSIS — F0283 Dementia in other diseases classified elsewhere, unspecified severity, with mood disturbance: Secondary | ICD-10-CM | POA: Diagnosis not present

## 2022-10-19 DIAGNOSIS — I25119 Atherosclerotic heart disease of native coronary artery with unspecified angina pectoris: Secondary | ICD-10-CM | POA: Diagnosis not present

## 2022-10-19 DIAGNOSIS — I130 Hypertensive heart and chronic kidney disease with heart failure and stage 1 through stage 4 chronic kidney disease, or unspecified chronic kidney disease: Secondary | ICD-10-CM | POA: Diagnosis not present

## 2022-10-19 DIAGNOSIS — S129XXD Fracture of neck, unspecified, subsequent encounter: Secondary | ICD-10-CM | POA: Diagnosis not present

## 2022-10-19 DIAGNOSIS — G311 Senile degeneration of brain, not elsewhere classified: Secondary | ICD-10-CM | POA: Diagnosis not present

## 2022-10-20 DIAGNOSIS — I130 Hypertensive heart and chronic kidney disease with heart failure and stage 1 through stage 4 chronic kidney disease, or unspecified chronic kidney disease: Secondary | ICD-10-CM | POA: Diagnosis not present

## 2022-10-20 DIAGNOSIS — G311 Senile degeneration of brain, not elsewhere classified: Secondary | ICD-10-CM | POA: Diagnosis not present

## 2022-10-20 DIAGNOSIS — S129XXD Fracture of neck, unspecified, subsequent encounter: Secondary | ICD-10-CM | POA: Diagnosis not present

## 2022-10-20 DIAGNOSIS — F0283 Dementia in other diseases classified elsewhere, unspecified severity, with mood disturbance: Secondary | ICD-10-CM | POA: Diagnosis not present

## 2022-10-20 DIAGNOSIS — I25119 Atherosclerotic heart disease of native coronary artery with unspecified angina pectoris: Secondary | ICD-10-CM | POA: Diagnosis not present

## 2022-10-20 DIAGNOSIS — R1310 Dysphagia, unspecified: Secondary | ICD-10-CM | POA: Diagnosis not present

## 2022-10-21 DIAGNOSIS — F0283 Dementia in other diseases classified elsewhere, unspecified severity, with mood disturbance: Secondary | ICD-10-CM | POA: Diagnosis not present

## 2022-10-21 DIAGNOSIS — G311 Senile degeneration of brain, not elsewhere classified: Secondary | ICD-10-CM | POA: Diagnosis not present

## 2022-10-21 DIAGNOSIS — R1310 Dysphagia, unspecified: Secondary | ICD-10-CM | POA: Diagnosis not present

## 2022-10-21 DIAGNOSIS — S129XXD Fracture of neck, unspecified, subsequent encounter: Secondary | ICD-10-CM | POA: Diagnosis not present

## 2022-10-21 DIAGNOSIS — I130 Hypertensive heart and chronic kidney disease with heart failure and stage 1 through stage 4 chronic kidney disease, or unspecified chronic kidney disease: Secondary | ICD-10-CM | POA: Diagnosis not present

## 2022-10-21 DIAGNOSIS — I25119 Atherosclerotic heart disease of native coronary artery with unspecified angina pectoris: Secondary | ICD-10-CM | POA: Diagnosis not present

## 2022-10-22 DIAGNOSIS — F0283 Dementia in other diseases classified elsewhere, unspecified severity, with mood disturbance: Secondary | ICD-10-CM | POA: Diagnosis not present

## 2022-10-22 DIAGNOSIS — R1310 Dysphagia, unspecified: Secondary | ICD-10-CM | POA: Diagnosis not present

## 2022-10-22 DIAGNOSIS — G311 Senile degeneration of brain, not elsewhere classified: Secondary | ICD-10-CM | POA: Diagnosis not present

## 2022-10-22 DIAGNOSIS — I130 Hypertensive heart and chronic kidney disease with heart failure and stage 1 through stage 4 chronic kidney disease, or unspecified chronic kidney disease: Secondary | ICD-10-CM | POA: Diagnosis not present

## 2022-10-22 DIAGNOSIS — I25119 Atherosclerotic heart disease of native coronary artery with unspecified angina pectoris: Secondary | ICD-10-CM | POA: Diagnosis not present

## 2022-10-22 DIAGNOSIS — S129XXD Fracture of neck, unspecified, subsequent encounter: Secondary | ICD-10-CM | POA: Diagnosis not present

## 2022-10-23 DIAGNOSIS — I130 Hypertensive heart and chronic kidney disease with heart failure and stage 1 through stage 4 chronic kidney disease, or unspecified chronic kidney disease: Secondary | ICD-10-CM | POA: Diagnosis not present

## 2022-10-23 DIAGNOSIS — I25119 Atherosclerotic heart disease of native coronary artery with unspecified angina pectoris: Secondary | ICD-10-CM | POA: Diagnosis not present

## 2022-10-23 DIAGNOSIS — S129XXD Fracture of neck, unspecified, subsequent encounter: Secondary | ICD-10-CM | POA: Diagnosis not present

## 2022-10-23 DIAGNOSIS — R1310 Dysphagia, unspecified: Secondary | ICD-10-CM | POA: Diagnosis not present

## 2022-10-23 DIAGNOSIS — F0283 Dementia in other diseases classified elsewhere, unspecified severity, with mood disturbance: Secondary | ICD-10-CM | POA: Diagnosis not present

## 2022-10-23 DIAGNOSIS — G311 Senile degeneration of brain, not elsewhere classified: Secondary | ICD-10-CM | POA: Diagnosis not present

## 2022-10-24 DIAGNOSIS — I130 Hypertensive heart and chronic kidney disease with heart failure and stage 1 through stage 4 chronic kidney disease, or unspecified chronic kidney disease: Secondary | ICD-10-CM | POA: Diagnosis not present

## 2022-10-24 DIAGNOSIS — F0283 Dementia in other diseases classified elsewhere, unspecified severity, with mood disturbance: Secondary | ICD-10-CM | POA: Diagnosis not present

## 2022-10-24 DIAGNOSIS — I25119 Atherosclerotic heart disease of native coronary artery with unspecified angina pectoris: Secondary | ICD-10-CM | POA: Diagnosis not present

## 2022-10-24 DIAGNOSIS — S129XXD Fracture of neck, unspecified, subsequent encounter: Secondary | ICD-10-CM | POA: Diagnosis not present

## 2022-10-24 DIAGNOSIS — R1310 Dysphagia, unspecified: Secondary | ICD-10-CM | POA: Diagnosis not present

## 2022-10-24 DIAGNOSIS — G311 Senile degeneration of brain, not elsewhere classified: Secondary | ICD-10-CM | POA: Diagnosis not present

## 2022-10-25 DIAGNOSIS — R1310 Dysphagia, unspecified: Secondary | ICD-10-CM | POA: Diagnosis not present

## 2022-10-25 DIAGNOSIS — Z1331 Encounter for screening for depression: Secondary | ICD-10-CM | POA: Diagnosis not present

## 2022-10-25 DIAGNOSIS — G311 Senile degeneration of brain, not elsewhere classified: Secondary | ICD-10-CM | POA: Diagnosis not present

## 2022-10-25 DIAGNOSIS — I130 Hypertensive heart and chronic kidney disease with heart failure and stage 1 through stage 4 chronic kidney disease, or unspecified chronic kidney disease: Secondary | ICD-10-CM | POA: Diagnosis not present

## 2022-10-25 DIAGNOSIS — S129XXD Fracture of neck, unspecified, subsequent encounter: Secondary | ICD-10-CM | POA: Diagnosis not present

## 2022-10-25 DIAGNOSIS — Z0000 Encounter for general adult medical examination without abnormal findings: Secondary | ICD-10-CM | POA: Diagnosis not present

## 2022-10-25 DIAGNOSIS — I5032 Chronic diastolic (congestive) heart failure: Secondary | ICD-10-CM | POA: Diagnosis not present

## 2022-10-25 DIAGNOSIS — I2510 Atherosclerotic heart disease of native coronary artery without angina pectoris: Secondary | ICD-10-CM | POA: Diagnosis not present

## 2022-10-25 DIAGNOSIS — F0283 Dementia in other diseases classified elsewhere, unspecified severity, with mood disturbance: Secondary | ICD-10-CM | POA: Diagnosis not present

## 2022-10-25 DIAGNOSIS — R0600 Dyspnea, unspecified: Secondary | ICD-10-CM | POA: Diagnosis not present

## 2022-10-25 DIAGNOSIS — I25119 Atherosclerotic heart disease of native coronary artery with unspecified angina pectoris: Secondary | ICD-10-CM | POA: Diagnosis not present

## 2022-10-26 DIAGNOSIS — G311 Senile degeneration of brain, not elsewhere classified: Secondary | ICD-10-CM | POA: Diagnosis not present

## 2022-10-26 DIAGNOSIS — S129XXD Fracture of neck, unspecified, subsequent encounter: Secondary | ICD-10-CM | POA: Diagnosis not present

## 2022-10-26 DIAGNOSIS — I25119 Atherosclerotic heart disease of native coronary artery with unspecified angina pectoris: Secondary | ICD-10-CM | POA: Diagnosis not present

## 2022-10-26 DIAGNOSIS — F0283 Dementia in other diseases classified elsewhere, unspecified severity, with mood disturbance: Secondary | ICD-10-CM | POA: Diagnosis not present

## 2022-10-26 DIAGNOSIS — I130 Hypertensive heart and chronic kidney disease with heart failure and stage 1 through stage 4 chronic kidney disease, or unspecified chronic kidney disease: Secondary | ICD-10-CM | POA: Diagnosis not present

## 2022-10-26 DIAGNOSIS — R1310 Dysphagia, unspecified: Secondary | ICD-10-CM | POA: Diagnosis not present

## 2022-10-27 DIAGNOSIS — R1310 Dysphagia, unspecified: Secondary | ICD-10-CM | POA: Diagnosis not present

## 2022-10-27 DIAGNOSIS — I25119 Atherosclerotic heart disease of native coronary artery with unspecified angina pectoris: Secondary | ICD-10-CM | POA: Diagnosis not present

## 2022-10-27 DIAGNOSIS — S129XXD Fracture of neck, unspecified, subsequent encounter: Secondary | ICD-10-CM | POA: Diagnosis not present

## 2022-10-27 DIAGNOSIS — F0283 Dementia in other diseases classified elsewhere, unspecified severity, with mood disturbance: Secondary | ICD-10-CM | POA: Diagnosis not present

## 2022-10-27 DIAGNOSIS — I130 Hypertensive heart and chronic kidney disease with heart failure and stage 1 through stage 4 chronic kidney disease, or unspecified chronic kidney disease: Secondary | ICD-10-CM | POA: Diagnosis not present

## 2022-10-27 DIAGNOSIS — G311 Senile degeneration of brain, not elsewhere classified: Secondary | ICD-10-CM | POA: Diagnosis not present

## 2022-10-28 DIAGNOSIS — R1310 Dysphagia, unspecified: Secondary | ICD-10-CM | POA: Diagnosis not present

## 2022-10-28 DIAGNOSIS — F0283 Dementia in other diseases classified elsewhere, unspecified severity, with mood disturbance: Secondary | ICD-10-CM | POA: Diagnosis not present

## 2022-10-28 DIAGNOSIS — G311 Senile degeneration of brain, not elsewhere classified: Secondary | ICD-10-CM | POA: Diagnosis not present

## 2022-10-28 DIAGNOSIS — I25119 Atherosclerotic heart disease of native coronary artery with unspecified angina pectoris: Secondary | ICD-10-CM | POA: Diagnosis not present

## 2022-10-28 DIAGNOSIS — I130 Hypertensive heart and chronic kidney disease with heart failure and stage 1 through stage 4 chronic kidney disease, or unspecified chronic kidney disease: Secondary | ICD-10-CM | POA: Diagnosis not present

## 2022-10-28 DIAGNOSIS — S129XXD Fracture of neck, unspecified, subsequent encounter: Secondary | ICD-10-CM | POA: Diagnosis not present

## 2022-10-29 DIAGNOSIS — F0283 Dementia in other diseases classified elsewhere, unspecified severity, with mood disturbance: Secondary | ICD-10-CM | POA: Diagnosis not present

## 2022-10-29 DIAGNOSIS — S129XXD Fracture of neck, unspecified, subsequent encounter: Secondary | ICD-10-CM | POA: Diagnosis not present

## 2022-10-29 DIAGNOSIS — G311 Senile degeneration of brain, not elsewhere classified: Secondary | ICD-10-CM | POA: Diagnosis not present

## 2022-10-29 DIAGNOSIS — I25119 Atherosclerotic heart disease of native coronary artery with unspecified angina pectoris: Secondary | ICD-10-CM | POA: Diagnosis not present

## 2022-10-29 DIAGNOSIS — R1310 Dysphagia, unspecified: Secondary | ICD-10-CM | POA: Diagnosis not present

## 2022-10-29 DIAGNOSIS — I130 Hypertensive heart and chronic kidney disease with heart failure and stage 1 through stage 4 chronic kidney disease, or unspecified chronic kidney disease: Secondary | ICD-10-CM | POA: Diagnosis not present

## 2022-10-30 DIAGNOSIS — G311 Senile degeneration of brain, not elsewhere classified: Secondary | ICD-10-CM | POA: Diagnosis not present

## 2022-10-30 DIAGNOSIS — F0283 Dementia in other diseases classified elsewhere, unspecified severity, with mood disturbance: Secondary | ICD-10-CM | POA: Diagnosis not present

## 2022-10-30 DIAGNOSIS — R1310 Dysphagia, unspecified: Secondary | ICD-10-CM | POA: Diagnosis not present

## 2022-10-30 DIAGNOSIS — S129XXD Fracture of neck, unspecified, subsequent encounter: Secondary | ICD-10-CM | POA: Diagnosis not present

## 2022-10-30 DIAGNOSIS — I130 Hypertensive heart and chronic kidney disease with heart failure and stage 1 through stage 4 chronic kidney disease, or unspecified chronic kidney disease: Secondary | ICD-10-CM | POA: Diagnosis not present

## 2022-10-30 DIAGNOSIS — I25119 Atherosclerotic heart disease of native coronary artery with unspecified angina pectoris: Secondary | ICD-10-CM | POA: Diagnosis not present

## 2022-10-31 DIAGNOSIS — S129XXD Fracture of neck, unspecified, subsequent encounter: Secondary | ICD-10-CM | POA: Diagnosis not present

## 2022-10-31 DIAGNOSIS — F0283 Dementia in other diseases classified elsewhere, unspecified severity, with mood disturbance: Secondary | ICD-10-CM | POA: Diagnosis not present

## 2022-10-31 DIAGNOSIS — R1310 Dysphagia, unspecified: Secondary | ICD-10-CM | POA: Diagnosis not present

## 2022-10-31 DIAGNOSIS — I130 Hypertensive heart and chronic kidney disease with heart failure and stage 1 through stage 4 chronic kidney disease, or unspecified chronic kidney disease: Secondary | ICD-10-CM | POA: Diagnosis not present

## 2022-10-31 DIAGNOSIS — I25119 Atherosclerotic heart disease of native coronary artery with unspecified angina pectoris: Secondary | ICD-10-CM | POA: Diagnosis not present

## 2022-10-31 DIAGNOSIS — G311 Senile degeneration of brain, not elsewhere classified: Secondary | ICD-10-CM | POA: Diagnosis not present

## 2022-11-01 DIAGNOSIS — S129XXD Fracture of neck, unspecified, subsequent encounter: Secondary | ICD-10-CM | POA: Diagnosis not present

## 2022-11-01 DIAGNOSIS — G311 Senile degeneration of brain, not elsewhere classified: Secondary | ICD-10-CM | POA: Diagnosis not present

## 2022-11-01 DIAGNOSIS — I25119 Atherosclerotic heart disease of native coronary artery with unspecified angina pectoris: Secondary | ICD-10-CM | POA: Diagnosis not present

## 2022-11-01 DIAGNOSIS — F0283 Dementia in other diseases classified elsewhere, unspecified severity, with mood disturbance: Secondary | ICD-10-CM | POA: Diagnosis not present

## 2022-11-01 DIAGNOSIS — R1310 Dysphagia, unspecified: Secondary | ICD-10-CM | POA: Diagnosis not present

## 2022-11-01 DIAGNOSIS — I130 Hypertensive heart and chronic kidney disease with heart failure and stage 1 through stage 4 chronic kidney disease, or unspecified chronic kidney disease: Secondary | ICD-10-CM | POA: Diagnosis not present

## 2022-11-02 DIAGNOSIS — I130 Hypertensive heart and chronic kidney disease with heart failure and stage 1 through stage 4 chronic kidney disease, or unspecified chronic kidney disease: Secondary | ICD-10-CM | POA: Diagnosis not present

## 2022-11-02 DIAGNOSIS — R1310 Dysphagia, unspecified: Secondary | ICD-10-CM | POA: Diagnosis not present

## 2022-11-02 DIAGNOSIS — F0283 Dementia in other diseases classified elsewhere, unspecified severity, with mood disturbance: Secondary | ICD-10-CM | POA: Diagnosis not present

## 2022-11-02 DIAGNOSIS — S129XXD Fracture of neck, unspecified, subsequent encounter: Secondary | ICD-10-CM | POA: Diagnosis not present

## 2022-11-02 DIAGNOSIS — G311 Senile degeneration of brain, not elsewhere classified: Secondary | ICD-10-CM | POA: Diagnosis not present

## 2022-11-02 DIAGNOSIS — I25119 Atherosclerotic heart disease of native coronary artery with unspecified angina pectoris: Secondary | ICD-10-CM | POA: Diagnosis not present

## 2022-11-03 DIAGNOSIS — S129XXD Fracture of neck, unspecified, subsequent encounter: Secondary | ICD-10-CM | POA: Diagnosis not present

## 2022-11-03 DIAGNOSIS — I130 Hypertensive heart and chronic kidney disease with heart failure and stage 1 through stage 4 chronic kidney disease, or unspecified chronic kidney disease: Secondary | ICD-10-CM | POA: Diagnosis not present

## 2022-11-03 DIAGNOSIS — I25119 Atherosclerotic heart disease of native coronary artery with unspecified angina pectoris: Secondary | ICD-10-CM | POA: Diagnosis not present

## 2022-11-03 DIAGNOSIS — F0283 Dementia in other diseases classified elsewhere, unspecified severity, with mood disturbance: Secondary | ICD-10-CM | POA: Diagnosis not present

## 2022-11-03 DIAGNOSIS — G311 Senile degeneration of brain, not elsewhere classified: Secondary | ICD-10-CM | POA: Diagnosis not present

## 2022-11-03 DIAGNOSIS — R1310 Dysphagia, unspecified: Secondary | ICD-10-CM | POA: Diagnosis not present

## 2022-11-04 DIAGNOSIS — I25119 Atherosclerotic heart disease of native coronary artery with unspecified angina pectoris: Secondary | ICD-10-CM | POA: Diagnosis not present

## 2022-11-04 DIAGNOSIS — S129XXD Fracture of neck, unspecified, subsequent encounter: Secondary | ICD-10-CM | POA: Diagnosis not present

## 2022-11-04 DIAGNOSIS — F0283 Dementia in other diseases classified elsewhere, unspecified severity, with mood disturbance: Secondary | ICD-10-CM | POA: Diagnosis not present

## 2022-11-04 DIAGNOSIS — G311 Senile degeneration of brain, not elsewhere classified: Secondary | ICD-10-CM | POA: Diagnosis not present

## 2022-11-04 DIAGNOSIS — R1310 Dysphagia, unspecified: Secondary | ICD-10-CM | POA: Diagnosis not present

## 2022-11-04 DIAGNOSIS — I130 Hypertensive heart and chronic kidney disease with heart failure and stage 1 through stage 4 chronic kidney disease, or unspecified chronic kidney disease: Secondary | ICD-10-CM | POA: Diagnosis not present

## 2022-11-05 DIAGNOSIS — G311 Senile degeneration of brain, not elsewhere classified: Secondary | ICD-10-CM | POA: Diagnosis not present

## 2022-11-05 DIAGNOSIS — R1310 Dysphagia, unspecified: Secondary | ICD-10-CM | POA: Diagnosis not present

## 2022-11-05 DIAGNOSIS — I25119 Atherosclerotic heart disease of native coronary artery with unspecified angina pectoris: Secondary | ICD-10-CM | POA: Diagnosis not present

## 2022-11-05 DIAGNOSIS — F0283 Dementia in other diseases classified elsewhere, unspecified severity, with mood disturbance: Secondary | ICD-10-CM | POA: Diagnosis not present

## 2022-11-05 DIAGNOSIS — I130 Hypertensive heart and chronic kidney disease with heart failure and stage 1 through stage 4 chronic kidney disease, or unspecified chronic kidney disease: Secondary | ICD-10-CM | POA: Diagnosis not present

## 2022-11-05 DIAGNOSIS — S129XXD Fracture of neck, unspecified, subsequent encounter: Secondary | ICD-10-CM | POA: Diagnosis not present

## 2022-11-06 DIAGNOSIS — I25119 Atherosclerotic heart disease of native coronary artery with unspecified angina pectoris: Secondary | ICD-10-CM | POA: Diagnosis not present

## 2022-11-06 DIAGNOSIS — G311 Senile degeneration of brain, not elsewhere classified: Secondary | ICD-10-CM | POA: Diagnosis not present

## 2022-11-06 DIAGNOSIS — I130 Hypertensive heart and chronic kidney disease with heart failure and stage 1 through stage 4 chronic kidney disease, or unspecified chronic kidney disease: Secondary | ICD-10-CM | POA: Diagnosis not present

## 2022-11-06 DIAGNOSIS — R1310 Dysphagia, unspecified: Secondary | ICD-10-CM | POA: Diagnosis not present

## 2022-11-06 DIAGNOSIS — S129XXD Fracture of neck, unspecified, subsequent encounter: Secondary | ICD-10-CM | POA: Diagnosis not present

## 2022-11-06 DIAGNOSIS — F0283 Dementia in other diseases classified elsewhere, unspecified severity, with mood disturbance: Secondary | ICD-10-CM | POA: Diagnosis not present

## 2022-11-07 DIAGNOSIS — F0283 Dementia in other diseases classified elsewhere, unspecified severity, with mood disturbance: Secondary | ICD-10-CM | POA: Diagnosis not present

## 2022-11-07 DIAGNOSIS — I25119 Atherosclerotic heart disease of native coronary artery with unspecified angina pectoris: Secondary | ICD-10-CM | POA: Diagnosis not present

## 2022-11-07 DIAGNOSIS — I130 Hypertensive heart and chronic kidney disease with heart failure and stage 1 through stage 4 chronic kidney disease, or unspecified chronic kidney disease: Secondary | ICD-10-CM | POA: Diagnosis not present

## 2022-11-07 DIAGNOSIS — R1310 Dysphagia, unspecified: Secondary | ICD-10-CM | POA: Diagnosis not present

## 2022-11-07 DIAGNOSIS — S129XXD Fracture of neck, unspecified, subsequent encounter: Secondary | ICD-10-CM | POA: Diagnosis not present

## 2022-11-07 DIAGNOSIS — G311 Senile degeneration of brain, not elsewhere classified: Secondary | ICD-10-CM | POA: Diagnosis not present

## 2022-11-08 DIAGNOSIS — S129XXD Fracture of neck, unspecified, subsequent encounter: Secondary | ICD-10-CM | POA: Diagnosis not present

## 2022-11-08 DIAGNOSIS — G311 Senile degeneration of brain, not elsewhere classified: Secondary | ICD-10-CM | POA: Diagnosis not present

## 2022-11-08 DIAGNOSIS — I25119 Atherosclerotic heart disease of native coronary artery with unspecified angina pectoris: Secondary | ICD-10-CM | POA: Diagnosis not present

## 2022-11-08 DIAGNOSIS — F0283 Dementia in other diseases classified elsewhere, unspecified severity, with mood disturbance: Secondary | ICD-10-CM | POA: Diagnosis not present

## 2022-11-08 DIAGNOSIS — I130 Hypertensive heart and chronic kidney disease with heart failure and stage 1 through stage 4 chronic kidney disease, or unspecified chronic kidney disease: Secondary | ICD-10-CM | POA: Diagnosis not present

## 2022-11-08 DIAGNOSIS — R1310 Dysphagia, unspecified: Secondary | ICD-10-CM | POA: Diagnosis not present

## 2022-11-09 DIAGNOSIS — I25119 Atherosclerotic heart disease of native coronary artery with unspecified angina pectoris: Secondary | ICD-10-CM | POA: Diagnosis not present

## 2022-11-09 DIAGNOSIS — J449 Chronic obstructive pulmonary disease, unspecified: Secondary | ICD-10-CM | POA: Diagnosis not present

## 2022-11-09 DIAGNOSIS — I272 Pulmonary hypertension, unspecified: Secondary | ICD-10-CM | POA: Diagnosis not present

## 2022-11-09 DIAGNOSIS — G311 Senile degeneration of brain, not elsewhere classified: Secondary | ICD-10-CM | POA: Diagnosis not present

## 2022-11-09 DIAGNOSIS — F0283 Dementia in other diseases classified elsewhere, unspecified severity, with mood disturbance: Secondary | ICD-10-CM | POA: Diagnosis not present

## 2022-11-09 DIAGNOSIS — I509 Heart failure, unspecified: Secondary | ICD-10-CM | POA: Diagnosis not present

## 2022-11-09 DIAGNOSIS — E785 Hyperlipidemia, unspecified: Secondary | ICD-10-CM | POA: Diagnosis not present

## 2022-11-09 DIAGNOSIS — I130 Hypertensive heart and chronic kidney disease with heart failure and stage 1 through stage 4 chronic kidney disease, or unspecified chronic kidney disease: Secondary | ICD-10-CM | POA: Diagnosis not present

## 2022-11-09 DIAGNOSIS — I4891 Unspecified atrial fibrillation: Secondary | ICD-10-CM | POA: Diagnosis not present

## 2022-11-09 DIAGNOSIS — S129XXD Fracture of neck, unspecified, subsequent encounter: Secondary | ICD-10-CM | POA: Diagnosis not present

## 2022-11-09 DIAGNOSIS — N1832 Chronic kidney disease, stage 3b: Secondary | ICD-10-CM | POA: Diagnosis not present

## 2022-11-09 DIAGNOSIS — R1310 Dysphagia, unspecified: Secondary | ICD-10-CM | POA: Diagnosis not present

## 2022-11-10 DIAGNOSIS — F0283 Dementia in other diseases classified elsewhere, unspecified severity, with mood disturbance: Secondary | ICD-10-CM | POA: Diagnosis not present

## 2022-11-10 DIAGNOSIS — R1310 Dysphagia, unspecified: Secondary | ICD-10-CM | POA: Diagnosis not present

## 2022-11-10 DIAGNOSIS — I25119 Atherosclerotic heart disease of native coronary artery with unspecified angina pectoris: Secondary | ICD-10-CM | POA: Diagnosis not present

## 2022-11-10 DIAGNOSIS — G311 Senile degeneration of brain, not elsewhere classified: Secondary | ICD-10-CM | POA: Diagnosis not present

## 2022-11-10 DIAGNOSIS — S129XXD Fracture of neck, unspecified, subsequent encounter: Secondary | ICD-10-CM | POA: Diagnosis not present

## 2022-11-10 DIAGNOSIS — I130 Hypertensive heart and chronic kidney disease with heart failure and stage 1 through stage 4 chronic kidney disease, or unspecified chronic kidney disease: Secondary | ICD-10-CM | POA: Diagnosis not present

## 2022-11-11 DIAGNOSIS — R1310 Dysphagia, unspecified: Secondary | ICD-10-CM | POA: Diagnosis not present

## 2022-11-11 DIAGNOSIS — I130 Hypertensive heart and chronic kidney disease with heart failure and stage 1 through stage 4 chronic kidney disease, or unspecified chronic kidney disease: Secondary | ICD-10-CM | POA: Diagnosis not present

## 2022-11-11 DIAGNOSIS — I25119 Atherosclerotic heart disease of native coronary artery with unspecified angina pectoris: Secondary | ICD-10-CM | POA: Diagnosis not present

## 2022-11-11 DIAGNOSIS — S129XXD Fracture of neck, unspecified, subsequent encounter: Secondary | ICD-10-CM | POA: Diagnosis not present

## 2022-11-11 DIAGNOSIS — F0283 Dementia in other diseases classified elsewhere, unspecified severity, with mood disturbance: Secondary | ICD-10-CM | POA: Diagnosis not present

## 2022-11-11 DIAGNOSIS — G311 Senile degeneration of brain, not elsewhere classified: Secondary | ICD-10-CM | POA: Diagnosis not present

## 2022-11-12 DIAGNOSIS — G311 Senile degeneration of brain, not elsewhere classified: Secondary | ICD-10-CM | POA: Diagnosis not present

## 2022-11-12 DIAGNOSIS — S129XXD Fracture of neck, unspecified, subsequent encounter: Secondary | ICD-10-CM | POA: Diagnosis not present

## 2022-11-12 DIAGNOSIS — F0283 Dementia in other diseases classified elsewhere, unspecified severity, with mood disturbance: Secondary | ICD-10-CM | POA: Diagnosis not present

## 2022-11-12 DIAGNOSIS — I130 Hypertensive heart and chronic kidney disease with heart failure and stage 1 through stage 4 chronic kidney disease, or unspecified chronic kidney disease: Secondary | ICD-10-CM | POA: Diagnosis not present

## 2022-11-12 DIAGNOSIS — R1310 Dysphagia, unspecified: Secondary | ICD-10-CM | POA: Diagnosis not present

## 2022-11-12 DIAGNOSIS — I25119 Atherosclerotic heart disease of native coronary artery with unspecified angina pectoris: Secondary | ICD-10-CM | POA: Diagnosis not present

## 2022-11-13 DIAGNOSIS — I130 Hypertensive heart and chronic kidney disease with heart failure and stage 1 through stage 4 chronic kidney disease, or unspecified chronic kidney disease: Secondary | ICD-10-CM | POA: Diagnosis not present

## 2022-11-13 DIAGNOSIS — F0283 Dementia in other diseases classified elsewhere, unspecified severity, with mood disturbance: Secondary | ICD-10-CM | POA: Diagnosis not present

## 2022-11-13 DIAGNOSIS — S129XXD Fracture of neck, unspecified, subsequent encounter: Secondary | ICD-10-CM | POA: Diagnosis not present

## 2022-11-13 DIAGNOSIS — G311 Senile degeneration of brain, not elsewhere classified: Secondary | ICD-10-CM | POA: Diagnosis not present

## 2022-11-13 DIAGNOSIS — R1310 Dysphagia, unspecified: Secondary | ICD-10-CM | POA: Diagnosis not present

## 2022-11-13 DIAGNOSIS — I25119 Atherosclerotic heart disease of native coronary artery with unspecified angina pectoris: Secondary | ICD-10-CM | POA: Diagnosis not present

## 2022-11-14 DIAGNOSIS — S129XXD Fracture of neck, unspecified, subsequent encounter: Secondary | ICD-10-CM | POA: Diagnosis not present

## 2022-11-14 DIAGNOSIS — R1310 Dysphagia, unspecified: Secondary | ICD-10-CM | POA: Diagnosis not present

## 2022-11-14 DIAGNOSIS — I130 Hypertensive heart and chronic kidney disease with heart failure and stage 1 through stage 4 chronic kidney disease, or unspecified chronic kidney disease: Secondary | ICD-10-CM | POA: Diagnosis not present

## 2022-11-14 DIAGNOSIS — F0283 Dementia in other diseases classified elsewhere, unspecified severity, with mood disturbance: Secondary | ICD-10-CM | POA: Diagnosis not present

## 2022-11-14 DIAGNOSIS — G311 Senile degeneration of brain, not elsewhere classified: Secondary | ICD-10-CM | POA: Diagnosis not present

## 2022-11-14 DIAGNOSIS — I25119 Atherosclerotic heart disease of native coronary artery with unspecified angina pectoris: Secondary | ICD-10-CM | POA: Diagnosis not present

## 2022-11-15 DIAGNOSIS — G311 Senile degeneration of brain, not elsewhere classified: Secondary | ICD-10-CM | POA: Diagnosis not present

## 2022-11-15 DIAGNOSIS — R1310 Dysphagia, unspecified: Secondary | ICD-10-CM | POA: Diagnosis not present

## 2022-11-15 DIAGNOSIS — I130 Hypertensive heart and chronic kidney disease with heart failure and stage 1 through stage 4 chronic kidney disease, or unspecified chronic kidney disease: Secondary | ICD-10-CM | POA: Diagnosis not present

## 2022-11-15 DIAGNOSIS — S129XXD Fracture of neck, unspecified, subsequent encounter: Secondary | ICD-10-CM | POA: Diagnosis not present

## 2022-11-15 DIAGNOSIS — F0283 Dementia in other diseases classified elsewhere, unspecified severity, with mood disturbance: Secondary | ICD-10-CM | POA: Diagnosis not present

## 2022-11-15 DIAGNOSIS — I25119 Atherosclerotic heart disease of native coronary artery with unspecified angina pectoris: Secondary | ICD-10-CM | POA: Diagnosis not present

## 2022-11-16 DIAGNOSIS — G311 Senile degeneration of brain, not elsewhere classified: Secondary | ICD-10-CM | POA: Diagnosis not present

## 2022-11-16 DIAGNOSIS — S129XXD Fracture of neck, unspecified, subsequent encounter: Secondary | ICD-10-CM | POA: Diagnosis not present

## 2022-11-16 DIAGNOSIS — F0283 Dementia in other diseases classified elsewhere, unspecified severity, with mood disturbance: Secondary | ICD-10-CM | POA: Diagnosis not present

## 2022-11-16 DIAGNOSIS — I25119 Atherosclerotic heart disease of native coronary artery with unspecified angina pectoris: Secondary | ICD-10-CM | POA: Diagnosis not present

## 2022-11-16 DIAGNOSIS — I130 Hypertensive heart and chronic kidney disease with heart failure and stage 1 through stage 4 chronic kidney disease, or unspecified chronic kidney disease: Secondary | ICD-10-CM | POA: Diagnosis not present

## 2022-11-16 DIAGNOSIS — R1310 Dysphagia, unspecified: Secondary | ICD-10-CM | POA: Diagnosis not present

## 2022-11-17 DIAGNOSIS — S129XXD Fracture of neck, unspecified, subsequent encounter: Secondary | ICD-10-CM | POA: Diagnosis not present

## 2022-11-17 DIAGNOSIS — G311 Senile degeneration of brain, not elsewhere classified: Secondary | ICD-10-CM | POA: Diagnosis not present

## 2022-11-17 DIAGNOSIS — I130 Hypertensive heart and chronic kidney disease with heart failure and stage 1 through stage 4 chronic kidney disease, or unspecified chronic kidney disease: Secondary | ICD-10-CM | POA: Diagnosis not present

## 2022-11-17 DIAGNOSIS — I25119 Atherosclerotic heart disease of native coronary artery with unspecified angina pectoris: Secondary | ICD-10-CM | POA: Diagnosis not present

## 2022-11-17 DIAGNOSIS — F0283 Dementia in other diseases classified elsewhere, unspecified severity, with mood disturbance: Secondary | ICD-10-CM | POA: Diagnosis not present

## 2022-11-17 DIAGNOSIS — R1310 Dysphagia, unspecified: Secondary | ICD-10-CM | POA: Diagnosis not present

## 2022-11-18 DIAGNOSIS — I130 Hypertensive heart and chronic kidney disease with heart failure and stage 1 through stage 4 chronic kidney disease, or unspecified chronic kidney disease: Secondary | ICD-10-CM | POA: Diagnosis not present

## 2022-11-18 DIAGNOSIS — I25119 Atherosclerotic heart disease of native coronary artery with unspecified angina pectoris: Secondary | ICD-10-CM | POA: Diagnosis not present

## 2022-11-18 DIAGNOSIS — G311 Senile degeneration of brain, not elsewhere classified: Secondary | ICD-10-CM | POA: Diagnosis not present

## 2022-11-18 DIAGNOSIS — F0283 Dementia in other diseases classified elsewhere, unspecified severity, with mood disturbance: Secondary | ICD-10-CM | POA: Diagnosis not present

## 2022-11-18 DIAGNOSIS — S129XXD Fracture of neck, unspecified, subsequent encounter: Secondary | ICD-10-CM | POA: Diagnosis not present

## 2022-11-18 DIAGNOSIS — R1310 Dysphagia, unspecified: Secondary | ICD-10-CM | POA: Diagnosis not present

## 2022-11-19 DIAGNOSIS — R1310 Dysphagia, unspecified: Secondary | ICD-10-CM | POA: Diagnosis not present

## 2022-11-19 DIAGNOSIS — S129XXD Fracture of neck, unspecified, subsequent encounter: Secondary | ICD-10-CM | POA: Diagnosis not present

## 2022-11-19 DIAGNOSIS — F0283 Dementia in other diseases classified elsewhere, unspecified severity, with mood disturbance: Secondary | ICD-10-CM | POA: Diagnosis not present

## 2022-11-19 DIAGNOSIS — I130 Hypertensive heart and chronic kidney disease with heart failure and stage 1 through stage 4 chronic kidney disease, or unspecified chronic kidney disease: Secondary | ICD-10-CM | POA: Diagnosis not present

## 2022-11-19 DIAGNOSIS — G311 Senile degeneration of brain, not elsewhere classified: Secondary | ICD-10-CM | POA: Diagnosis not present

## 2022-11-19 DIAGNOSIS — I25119 Atherosclerotic heart disease of native coronary artery with unspecified angina pectoris: Secondary | ICD-10-CM | POA: Diagnosis not present

## 2022-11-20 DIAGNOSIS — F0283 Dementia in other diseases classified elsewhere, unspecified severity, with mood disturbance: Secondary | ICD-10-CM | POA: Diagnosis not present

## 2022-11-20 DIAGNOSIS — G311 Senile degeneration of brain, not elsewhere classified: Secondary | ICD-10-CM | POA: Diagnosis not present

## 2022-11-20 DIAGNOSIS — I130 Hypertensive heart and chronic kidney disease with heart failure and stage 1 through stage 4 chronic kidney disease, or unspecified chronic kidney disease: Secondary | ICD-10-CM | POA: Diagnosis not present

## 2022-11-20 DIAGNOSIS — S129XXD Fracture of neck, unspecified, subsequent encounter: Secondary | ICD-10-CM | POA: Diagnosis not present

## 2022-11-20 DIAGNOSIS — I25119 Atherosclerotic heart disease of native coronary artery with unspecified angina pectoris: Secondary | ICD-10-CM | POA: Diagnosis not present

## 2022-11-20 DIAGNOSIS — R1310 Dysphagia, unspecified: Secondary | ICD-10-CM | POA: Diagnosis not present

## 2022-11-21 DIAGNOSIS — R1310 Dysphagia, unspecified: Secondary | ICD-10-CM | POA: Diagnosis not present

## 2022-11-21 DIAGNOSIS — F0283 Dementia in other diseases classified elsewhere, unspecified severity, with mood disturbance: Secondary | ICD-10-CM | POA: Diagnosis not present

## 2022-11-21 DIAGNOSIS — G311 Senile degeneration of brain, not elsewhere classified: Secondary | ICD-10-CM | POA: Diagnosis not present

## 2022-11-21 DIAGNOSIS — I25119 Atherosclerotic heart disease of native coronary artery with unspecified angina pectoris: Secondary | ICD-10-CM | POA: Diagnosis not present

## 2022-11-21 DIAGNOSIS — S129XXD Fracture of neck, unspecified, subsequent encounter: Secondary | ICD-10-CM | POA: Diagnosis not present

## 2022-11-21 DIAGNOSIS — I130 Hypertensive heart and chronic kidney disease with heart failure and stage 1 through stage 4 chronic kidney disease, or unspecified chronic kidney disease: Secondary | ICD-10-CM | POA: Diagnosis not present

## 2022-11-22 DIAGNOSIS — G311 Senile degeneration of brain, not elsewhere classified: Secondary | ICD-10-CM | POA: Diagnosis not present

## 2022-11-22 DIAGNOSIS — F0283 Dementia in other diseases classified elsewhere, unspecified severity, with mood disturbance: Secondary | ICD-10-CM | POA: Diagnosis not present

## 2022-11-22 DIAGNOSIS — I25119 Atherosclerotic heart disease of native coronary artery with unspecified angina pectoris: Secondary | ICD-10-CM | POA: Diagnosis not present

## 2022-11-22 DIAGNOSIS — S129XXD Fracture of neck, unspecified, subsequent encounter: Secondary | ICD-10-CM | POA: Diagnosis not present

## 2022-11-22 DIAGNOSIS — I130 Hypertensive heart and chronic kidney disease with heart failure and stage 1 through stage 4 chronic kidney disease, or unspecified chronic kidney disease: Secondary | ICD-10-CM | POA: Diagnosis not present

## 2022-11-22 DIAGNOSIS — R1310 Dysphagia, unspecified: Secondary | ICD-10-CM | POA: Diagnosis not present

## 2022-11-23 DIAGNOSIS — G311 Senile degeneration of brain, not elsewhere classified: Secondary | ICD-10-CM | POA: Diagnosis not present

## 2022-11-23 DIAGNOSIS — I25119 Atherosclerotic heart disease of native coronary artery with unspecified angina pectoris: Secondary | ICD-10-CM | POA: Diagnosis not present

## 2022-11-23 DIAGNOSIS — R1310 Dysphagia, unspecified: Secondary | ICD-10-CM | POA: Diagnosis not present

## 2022-11-23 DIAGNOSIS — S129XXD Fracture of neck, unspecified, subsequent encounter: Secondary | ICD-10-CM | POA: Diagnosis not present

## 2022-11-23 DIAGNOSIS — F0283 Dementia in other diseases classified elsewhere, unspecified severity, with mood disturbance: Secondary | ICD-10-CM | POA: Diagnosis not present

## 2022-11-23 DIAGNOSIS — I130 Hypertensive heart and chronic kidney disease with heart failure and stage 1 through stage 4 chronic kidney disease, or unspecified chronic kidney disease: Secondary | ICD-10-CM | POA: Diagnosis not present

## 2022-11-24 DIAGNOSIS — G311 Senile degeneration of brain, not elsewhere classified: Secondary | ICD-10-CM | POA: Diagnosis not present

## 2022-11-24 DIAGNOSIS — F0283 Dementia in other diseases classified elsewhere, unspecified severity, with mood disturbance: Secondary | ICD-10-CM | POA: Diagnosis not present

## 2022-11-24 DIAGNOSIS — I25119 Atherosclerotic heart disease of native coronary artery with unspecified angina pectoris: Secondary | ICD-10-CM | POA: Diagnosis not present

## 2022-11-24 DIAGNOSIS — S129XXD Fracture of neck, unspecified, subsequent encounter: Secondary | ICD-10-CM | POA: Diagnosis not present

## 2022-11-24 DIAGNOSIS — R1310 Dysphagia, unspecified: Secondary | ICD-10-CM | POA: Diagnosis not present

## 2022-11-24 DIAGNOSIS — I130 Hypertensive heart and chronic kidney disease with heart failure and stage 1 through stage 4 chronic kidney disease, or unspecified chronic kidney disease: Secondary | ICD-10-CM | POA: Diagnosis not present

## 2022-11-25 DIAGNOSIS — F0283 Dementia in other diseases classified elsewhere, unspecified severity, with mood disturbance: Secondary | ICD-10-CM | POA: Diagnosis not present

## 2022-11-25 DIAGNOSIS — R1310 Dysphagia, unspecified: Secondary | ICD-10-CM | POA: Diagnosis not present

## 2022-11-25 DIAGNOSIS — I25119 Atherosclerotic heart disease of native coronary artery with unspecified angina pectoris: Secondary | ICD-10-CM | POA: Diagnosis not present

## 2022-11-25 DIAGNOSIS — G311 Senile degeneration of brain, not elsewhere classified: Secondary | ICD-10-CM | POA: Diagnosis not present

## 2022-11-25 DIAGNOSIS — I130 Hypertensive heart and chronic kidney disease with heart failure and stage 1 through stage 4 chronic kidney disease, or unspecified chronic kidney disease: Secondary | ICD-10-CM | POA: Diagnosis not present

## 2022-11-25 DIAGNOSIS — S129XXD Fracture of neck, unspecified, subsequent encounter: Secondary | ICD-10-CM | POA: Diagnosis not present

## 2022-11-26 DIAGNOSIS — S129XXD Fracture of neck, unspecified, subsequent encounter: Secondary | ICD-10-CM | POA: Diagnosis not present

## 2022-11-26 DIAGNOSIS — G311 Senile degeneration of brain, not elsewhere classified: Secondary | ICD-10-CM | POA: Diagnosis not present

## 2022-11-26 DIAGNOSIS — F0283 Dementia in other diseases classified elsewhere, unspecified severity, with mood disturbance: Secondary | ICD-10-CM | POA: Diagnosis not present

## 2022-11-26 DIAGNOSIS — I25119 Atherosclerotic heart disease of native coronary artery with unspecified angina pectoris: Secondary | ICD-10-CM | POA: Diagnosis not present

## 2022-11-26 DIAGNOSIS — R1310 Dysphagia, unspecified: Secondary | ICD-10-CM | POA: Diagnosis not present

## 2022-11-26 DIAGNOSIS — I130 Hypertensive heart and chronic kidney disease with heart failure and stage 1 through stage 4 chronic kidney disease, or unspecified chronic kidney disease: Secondary | ICD-10-CM | POA: Diagnosis not present

## 2022-11-27 DIAGNOSIS — F0283 Dementia in other diseases classified elsewhere, unspecified severity, with mood disturbance: Secondary | ICD-10-CM | POA: Diagnosis not present

## 2022-11-27 DIAGNOSIS — G311 Senile degeneration of brain, not elsewhere classified: Secondary | ICD-10-CM | POA: Diagnosis not present

## 2022-11-27 DIAGNOSIS — S129XXD Fracture of neck, unspecified, subsequent encounter: Secondary | ICD-10-CM | POA: Diagnosis not present

## 2022-11-27 DIAGNOSIS — I25119 Atherosclerotic heart disease of native coronary artery with unspecified angina pectoris: Secondary | ICD-10-CM | POA: Diagnosis not present

## 2022-11-27 DIAGNOSIS — R1310 Dysphagia, unspecified: Secondary | ICD-10-CM | POA: Diagnosis not present

## 2022-11-27 DIAGNOSIS — I130 Hypertensive heart and chronic kidney disease with heart failure and stage 1 through stage 4 chronic kidney disease, or unspecified chronic kidney disease: Secondary | ICD-10-CM | POA: Diagnosis not present

## 2022-11-28 DIAGNOSIS — F0283 Dementia in other diseases classified elsewhere, unspecified severity, with mood disturbance: Secondary | ICD-10-CM | POA: Diagnosis not present

## 2022-11-28 DIAGNOSIS — I25119 Atherosclerotic heart disease of native coronary artery with unspecified angina pectoris: Secondary | ICD-10-CM | POA: Diagnosis not present

## 2022-11-28 DIAGNOSIS — R1310 Dysphagia, unspecified: Secondary | ICD-10-CM | POA: Diagnosis not present

## 2022-11-28 DIAGNOSIS — S129XXD Fracture of neck, unspecified, subsequent encounter: Secondary | ICD-10-CM | POA: Diagnosis not present

## 2022-11-28 DIAGNOSIS — G311 Senile degeneration of brain, not elsewhere classified: Secondary | ICD-10-CM | POA: Diagnosis not present

## 2022-11-28 DIAGNOSIS — I130 Hypertensive heart and chronic kidney disease with heart failure and stage 1 through stage 4 chronic kidney disease, or unspecified chronic kidney disease: Secondary | ICD-10-CM | POA: Diagnosis not present

## 2022-12-10 DEATH — deceased
